# Patient Record
Sex: Female | Born: 1980 | Race: White | Hispanic: No | Marital: Married | Smoking: Never smoker
Health system: Southern US, Community
[De-identification: ages and names within clinical notes are randomized; demographics above are authoritative.]

## PROBLEM LIST (undated history)

## (undated) DIAGNOSIS — G4730 Sleep apnea, unspecified: Secondary | ICD-10-CM

## (undated) DIAGNOSIS — Z9889 Other specified postprocedural states: Secondary | ICD-10-CM

## (undated) DIAGNOSIS — C50919 Malignant neoplasm of unspecified site of unspecified female breast: Secondary | ICD-10-CM

## (undated) DIAGNOSIS — M549 Dorsalgia, unspecified: Secondary | ICD-10-CM

## (undated) DIAGNOSIS — I499 Cardiac arrhythmia, unspecified: Secondary | ICD-10-CM

## (undated) DIAGNOSIS — I341 Nonrheumatic mitral (valve) prolapse: Secondary | ICD-10-CM

## (undated) DIAGNOSIS — R112 Nausea with vomiting, unspecified: Secondary | ICD-10-CM

## (undated) DIAGNOSIS — F32A Depression, unspecified: Secondary | ICD-10-CM

## (undated) HISTORY — DX: Malignant neoplasm of unspecified site of unspecified female breast: C50.919

## (undated) HISTORY — PX: ABDOMINAL HYSTERECTOMY: SHX81

---

## 2011-08-10 ENCOUNTER — Encounter: Payer: Self-pay | Admitting: Interventional Cardiology

## 2011-08-19 ENCOUNTER — Encounter: Payer: Self-pay | Admitting: Interventional Cardiology

## 2015-03-28 ENCOUNTER — Encounter: Payer: Self-pay | Admitting: Interventional Cardiology

## 2015-07-05 ENCOUNTER — Encounter: Payer: Self-pay | Admitting: Interventional Cardiology

## 2015-10-05 HISTORY — PX: TUBAL LIGATION: SHX77

## 2016-11-25 ENCOUNTER — Emergency Department (HOSPITAL_COMMUNITY)
Admission: EM | Admit: 2016-11-25 | Discharge: 2016-11-25 | Disposition: A | Discharge status: Home / Self Care | Payer: BLUE CROSS/BLUE SHIELD | Attending: Emergency Medicine | Admitting: Emergency Medicine

## 2016-11-25 ENCOUNTER — Encounter (HOSPITAL_COMMUNITY): Payer: Self-pay | Admitting: Emergency Medicine

## 2016-11-25 ENCOUNTER — Emergency Department (HOSPITAL_COMMUNITY): Payer: BLUE CROSS/BLUE SHIELD

## 2016-11-25 DIAGNOSIS — R079 Chest pain, unspecified: Secondary | ICD-10-CM | POA: Diagnosis present

## 2016-11-25 HISTORY — DX: Nonrheumatic mitral (valve) prolapse: I34.1

## 2016-11-25 LAB — BASIC METABOLIC PANEL
Anion gap: 6 (ref 5–15)
BUN: 15 mg/dL (ref 6–20)
CHLORIDE: 108 mmol/L (ref 101–111)
CO2: 24 mmol/L (ref 22–32)
Calcium: 9 mg/dL (ref 8.9–10.3)
Creatinine, Ser: 0.64 mg/dL (ref 0.44–1.00)
Glucose, Bld: 88 mg/dL (ref 65–99)
POTASSIUM: 3.6 mmol/L (ref 3.5–5.1)
SODIUM: 138 mmol/L (ref 135–145)

## 2016-11-25 LAB — CBC
HEMATOCRIT: 38.4 % (ref 36.0–46.0)
Hemoglobin: 13.1 g/dL (ref 12.0–15.0)
MCH: 29.2 pg (ref 26.0–34.0)
MCHC: 34.1 g/dL (ref 30.0–36.0)
MCV: 85.7 fL (ref 78.0–100.0)
PLATELETS: 247 10*3/uL (ref 150–400)
RBC: 4.48 MIL/uL (ref 3.87–5.11)
RDW: 13.3 % (ref 11.5–15.5)
WBC: 6.3 10*3/uL (ref 4.0–10.5)

## 2016-11-25 LAB — I-STAT TROPONIN, ED: Troponin i, poc: 0 ng/mL (ref 0.00–0.08)

## 2016-11-25 NOTE — ED Triage Notes (Signed)
Pt from home with hx of mitral valve prolapse. Pt states she just moved here from [place] and has not established a cardiologist. Pt states she has had left chest and clavicle pain that radiates to her left arm. Pt states that the pain has gotten worse tonight. Pt states she also had 1 palpitation. Pt states she has had palpitations before. Pt stated she just wanted to come be evaluated to ensure there is nothing wrong. Pt has clear lung sounds, strong bilateral radial pulses, and normal S1 and S2

## 2016-11-25 NOTE — ED Provider Notes (Signed)
[facility]-EMERGENCY DEPT Provider Note   CSN: [id number] Arrival date & time: 11/25/16  0030   History   Chief Complaint Chief Complaint  Patient presents with  . Chest Pain    HPI Suzanne Tucker is a 36 y.o. female.  36 year old female with a history of mitral valve prolapse without regurgitation presents to the emergency department for evaluation of chest pain and palpitations. Patient states that she has a history of intermittent chest pain and palpitations. She noticed increased frequency in her palpitations over the course of 4 days last week. She was fighting a cold and thought that her palpitations may be due to this. She noticed some palpitations today as well at approximately 2000. She subsequently developed a sharp pain in her left clavicle at 2300. She states that she has felt this pain before, as well, but it seemed stronger today than normal. This lasted a few minutes before spontaneously resolving. Patient took 4 baby aspirin tablets prior to arrival. No other medications taken for pain. Pain is not worse with exertion. She had no associated shortness of breath, diaphoresis, syncope, near syncope, nausea, or vomiting. She is not currently followed by a cardiologist in the area. She has no complaints at the present time.      Past Medical History:  Diagnosis Date  . Mitral prolapse     There are no active problems to display for this patient.   History reviewed. No pertinent surgical history.  OB History    No data available       Home Medications    Prior to Admission medications   Not on File    Family History No family history on file.  Social History Social History  Substance Use Topics  . Smoking status: Never Smoker  . Smokeless tobacco: Never Used  . Alcohol use Yes     Comment: socially      Allergies   Morphine and related and Hydrocodone   Review of Systems Review of Systems Ten systems reviewed and are negative for acute change,  except as noted in the HPI.    Physical Exam Updated Vital Signs BP 125/84 (BP Location: Right Arm)   Pulse 90   Temp 98.6 F (37 C) (Oral)   Resp 18   Ht 5\' 1"  (1.549 m)   Wt 65.3 kg   SpO2 100%   BMI 27.21 kg/m   Physical Exam  Constitutional: She is oriented to person, place, and time. She appears well-developed and well-nourished. No distress.  Nontoxic and in NAD  HENT:  Head: Normocephalic and atraumatic.  Eyes: Conjunctivae and EOM are normal. No scleral icterus.  Neck: Normal range of motion.  No JVD  Cardiovascular: Normal rate, regular rhythm and intact distal pulses.   Pulmonary/Chest: Effort normal. No respiratory distress. She has no wheezes.  Respirations even and unlabored  Musculoskeletal: Normal range of motion.  No BLE edema  Neurological: She is alert and oriented to person, place, and time. She exhibits normal muscle tone. Coordination normal.  GCS 15. Patient moving all extremities.  Skin: Skin is warm and dry. No rash noted. She is not diaphoretic. No erythema. No pallor.  Psychiatric: She has a normal mood and affect. Her behavior is normal.  Nursing note and vitals reviewed.    ED Treatments / Results  Labs (all labs ordered are listed, but only abnormal results are displayed) Labs Reviewed  BASIC METABOLIC PANEL  CBC  I-STAT TROPOININ, ED    EKG  EKG Interpretation None  Radiology Dg Chest 2 View  Result Date: 11/25/2016 CLINICAL DATA:  36 year old female with chest pain. History of mitral valve prolapse. EXAM: CHEST  2 VIEW COMPARISON:  None. FINDINGS: The heart size and mediastinal contours are within normal limits. Both lungs are clear. The visualized skeletal structures are unremarkable. IMPRESSION: No active cardiopulmonary disease. Electronically Signed   By: Elgie Collard M.D.   On: 11/25/2016 01:57    Procedures Procedures (including critical care time)  Medications Ordered in ED Medications - No data to  display   Initial Impression / Assessment and Plan / ED Course  I have reviewed the triage vital signs and the nursing notes.  Pertinent labs & imaging results that were available during my care of the patient were reviewed by me and considered in my medical decision making (see chart for details).     Patient presents to the emergency department for evaluation of chest pain. Patient reports experiencing similar symptoms in the past. Onset of pain was at 2300. She has not had recurrence of symptoms since her pain spontaneously resolved. She had no associated diaphoresis, shortness of breath, nausea, vomiting, or syncope. Cardiac workup today is reassuring. Low suspicion for emergent process. Given history of mitral valve prolapse, I have encouraged the patient to follow-up with a cardiologist for recheck. She has been given a referral to a local cardiologist as she recently relocated to [place] [employer] [employer]. Return precautions discussed and provided. Patient discharged in stable condition with no unaddressed concerns.   Final Clinical Impressions(s) / ED Diagnoses   Final diagnoses:  Nonspecific chest pain    New Prescriptions New Prescriptions   No medications on file     Antony Madura, PA-C 11/25/16 [id number]    April Palumbo, MD 11/25/16 [phone]

## 2016-11-25 NOTE — ED Notes (Signed)
Bed: WTR7 Expected date:  Expected time:  Means of arrival:  Comments: 

## 2017-10-28 ENCOUNTER — Encounter: Payer: Self-pay | Admitting: Emergency Medicine

## 2017-10-28 ENCOUNTER — Ambulatory Visit: Payer: BC Managed Care – PPO | Admitting: Emergency Medicine

## 2017-10-28 ENCOUNTER — Other Ambulatory Visit: Payer: Self-pay

## 2017-10-28 VITALS — BP 123/74 | HR 94 | Temp 98.5°F | Resp 16 | Ht 60.75 in | Wt 145.2 lb

## 2017-10-28 DIAGNOSIS — R202 Paresthesia of skin: Secondary | ICD-10-CM | POA: Insufficient documentation

## 2017-10-28 DIAGNOSIS — I341 Nonrheumatic mitral (valve) prolapse: Secondary | ICD-10-CM

## 2017-10-28 DIAGNOSIS — R209 Unspecified disturbances of skin sensation: Secondary | ICD-10-CM | POA: Diagnosis not present

## 2017-10-28 NOTE — Progress Notes (Signed)
Suzanne Tucker 37 y.o.   Chief Complaint  Patient presents with  . Establish Care    discuss NUMBNESS of forehead x 4 days  . Referral    CARDIOLOGY - per patient she has mitral valve prolaspe    HISTORY OF PRESENT ILLNESS: This is a 37 y.o. female complaining of numbness to forehead x 4 days; no other symptoms; has strong FHx of MS and is concerned; also has h/o MVP and needs cardiology referral. Has occasional palpitations.  HPI   Prior to Admission medications   Not on File    Allergies  Allergen Reactions  . Morphine And Related Anaphylaxis  . Hydrocodone Hives    There are no active problems to display for this patient.   Past Medical History:  Diagnosis Date  . Mitral prolapse     Past Surgical History:  Procedure Laterality Date  . CESAREAN SECTION  03/30/2008  . TUBAL LIGATION  2017    Social History   Socioeconomic History  . Marital status: Single    Spouse name: Not on file  . Number of children: Not on file  . Years of education: Not on file  . Highest education level: Not on file  Social Needs  . Financial resource strain: Not on file  . Food insecurity - worry: Not on file  . Food insecurity - inability: Not on file  . Transportation needs - medical: Not on file  . Transportation needs - non-medical: Not on file  Occupational History  . Not on file  Tobacco Use  . Smoking status: Never Smoker  . Smokeless tobacco: Never Used  Substance and Sexual Activity  . Alcohol use: Yes    Comment: socially   . Drug use: Not on file  . Sexual activity: Not on file  Other Topics Concern  . Not on file  Social History Narrative  . Not on file    Family History  Problem Relation Age of Onset  . Diabetes Maternal Grandmother   . Heart disease Maternal Grandmother   . Heart attack Maternal Grandmother   . Diabetes Maternal Grandfather   . Heart disease Maternal Grandfather   . Alzheimer's disease Paternal Grandmother   . Dementia Paternal  Grandmother   . Alzheimer's disease Paternal Grandfather   . Dementia Paternal Grandfather   . Alcohol abuse Paternal Grandfather      Review of Systems  Constitutional: Negative.  Negative for chills, fever, malaise/fatigue and weight loss.  HENT: Negative.  Negative for congestion, ear pain, hearing loss, nosebleeds, sinus pain and sore throat.   Eyes: Negative.  Negative for blurred vision, double vision, photophobia, pain, discharge and redness.  Respiratory: Negative.  Negative for cough, shortness of breath and wheezing.   Cardiovascular: Positive for palpitations. Negative for chest pain.  Gastrointestinal: Negative.  Negative for abdominal pain, blood in stool, diarrhea, nausea and vomiting.  Genitourinary: Negative.  Negative for dysuria and hematuria.  Musculoskeletal: Negative for back pain, myalgias and neck pain.  Skin: Negative.  Negative for rash.  Neurological: Negative for dizziness, tremors, sensory change, speech change, focal weakness, seizures, loss of consciousness and headaches.  Endo/Heme/Allergies: Negative.    Vitals:   10/28/17 1637  BP: 123/74  Pulse: 94  Resp: 16  Temp: 98.5 F (36.9 C)  SpO2: 100%     Physical Exam  Constitutional: She is oriented to person, place, and time. She appears well-developed and well-nourished.  HENT:  Head: Normocephalic and atraumatic.  Right Ear: Hearing, tympanic membrane,  external ear and ear canal normal.  Left Ear: Hearing, tympanic membrane, external ear and ear canal normal.  Nose: Nose normal.  Mouth/Throat: Oropharynx is clear and moist.  Eyes: Conjunctivae and EOM are normal. Pupils are equal, round, and reactive to light.  Fundoscopic exam:      The right eye shows no AV nicking, no exudate, no hemorrhage and no papilledema.       The left eye shows no AV nicking, no exudate, no hemorrhage and no papilledema.  Neck: Normal range of motion. Neck supple. No JVD present. No thyromegaly present.   Cardiovascular: Normal rate, regular rhythm and normal heart sounds.  No murmur (no MR murmur or clicks) heard. Pulmonary/Chest: Effort normal and breath sounds normal.  Musculoskeletal: Normal range of motion.  Lymphadenopathy:    She has no cervical adenopathy.  Neurological: She is alert and oriented to person, place, and time. No sensory deficit. She exhibits normal muscle tone.  Skin: Skin is warm and dry. Capillary refill takes less than 2 seconds. No rash noted.  Psychiatric: She has a normal mood and affect. Her behavior is normal.  Vitals reviewed.    ASSESSMENT & PLAN: Suzanne Tucker was seen today for establish care and referral.  Diagnoses and all orders for this visit:  Facial paresthesia -     Ambulatory referral to Neurology -     CBC with Differential/Platelet -     Comprehensive metabolic panel -     Thyroid Profile  MVP (mitral valve prolapse) -     Ambulatory referral to Cardiology      Suzanne Barth, MD [facility]

## 2017-10-28 NOTE — Patient Instructions (Signed)
     IF you received an x-ray today, you will receive an invoice from [facility]. Please contact [facility] at [phone] with questions or concerns regarding your invoice.   IF you received labwork today, you will receive an invoice from [facility]. Please contact [facility] at [phone] with questions or concerns regarding your invoice.   Our billing staff will not be able to assist you with questions regarding bills from these companies.  You will be contacted with the lab results as soon as they are available. The fastest way to get your results is to activate your My Chart account. Instructions are located on the last page of this paperwork. If you have not heard from us regarding the results in 2 weeks, please contact this office.     

## 2017-10-29 LAB — COMPREHENSIVE METABOLIC PANEL
ALK PHOS: 61 IU/L (ref 39–117)
ALT: 10 IU/L (ref 0–32)
AST: 18 IU/L (ref 0–40)
Albumin/Globulin Ratio: 1.7 (ref 1.2–2.2)
Albumin: 4.5 g/dL (ref 3.5–5.5)
BUN/Creatinine Ratio: 11 (ref 9–23)
BUN: 8 mg/dL (ref 6–20)
Bilirubin Total: 0.4 mg/dL (ref 0.0–1.2)
CO2: 26 mmol/L (ref 20–29)
CREATININE: 0.74 mg/dL (ref 0.57–1.00)
Calcium: 9.4 mg/dL (ref 8.7–10.2)
Chloride: 101 mmol/L (ref 96–106)
GFR calc Af Amer: 122 mL/min/{1.73_m2} (ref >59)
GFR calc non Af Amer: 106 mL/min/{1.73_m2} (ref >59)
GLOBULIN, TOTAL: 2.6 g/dL (ref 1.5–4.5)
Glucose: 119 mg/dL — ABNORMAL HIGH (ref 65–99)
POTASSIUM: 3.9 mmol/L (ref 3.5–5.2)
SODIUM: 142 mmol/L (ref 134–144)
Total Protein: 7.1 g/dL (ref 6.0–8.5)

## 2017-10-29 LAB — CBC WITH DIFFERENTIAL/PLATELET
BASOS ABS: 0 10*3/uL (ref 0.0–0.2)
Basos: 0 %
EOS (ABSOLUTE): 0.1 10*3/uL (ref 0.0–0.4)
EOS: 2 %
HEMATOCRIT: 37.7 % (ref 34.0–46.6)
Hemoglobin: 12.4 g/dL (ref 11.1–15.9)
Immature Grans (Abs): 0 10*3/uL (ref 0.0–0.1)
Immature Granulocytes: 0 %
LYMPHS ABS: 1.5 10*3/uL (ref 0.7–3.1)
Lymphs: 21 %
MCH: 29.4 pg (ref 26.6–33.0)
MCHC: 32.9 g/dL (ref 31.5–35.7)
MCV: 89 fL (ref 79–97)
MONOS ABS: 0.4 10*3/uL (ref 0.1–0.9)
Monocytes: 6 %
Neutrophils Absolute: 5.1 10*3/uL (ref 1.4–7.0)
Neutrophils: 71 %
Platelets: 284 10*3/uL (ref 150–379)
RBC: 4.22 x10E6/uL (ref 3.77–5.28)
RDW: 13.1 % (ref 12.3–15.4)
WBC: 7.2 10*3/uL (ref 3.4–10.8)

## 2017-10-29 LAB — THYROID PANEL
Free Thyroxine Index: 2 (ref 1.2–4.9)
T3 UPTAKE RATIO: 23 % — AB (ref 24–39)
T4 TOTAL: 8.5 ug/dL (ref 4.5–12.0)

## 2017-11-01 ENCOUNTER — Telehealth: Payer: Self-pay | Admitting: Emergency Medicine

## 2017-11-01 NOTE — Telephone Encounter (Signed)
Returned pt call and left message for pt to call back.

## 2017-11-02 ENCOUNTER — Ambulatory Visit: Payer: BC Managed Care – PPO | Admitting: Neurology

## 2017-11-02 ENCOUNTER — Encounter: Payer: Self-pay | Admitting: Neurology

## 2017-11-02 VITALS — BP 129/82 | HR 77 | Ht 60.75 in | Wt 147.0 lb

## 2017-11-02 DIAGNOSIS — Z820 Family history of epilepsy and other diseases of the nervous system: Secondary | ICD-10-CM

## 2017-11-02 DIAGNOSIS — G5601 Carpal tunnel syndrome, right upper limb: Secondary | ICD-10-CM | POA: Diagnosis not present

## 2017-11-02 DIAGNOSIS — R209 Unspecified disturbances of skin sensation: Secondary | ICD-10-CM

## 2017-11-02 DIAGNOSIS — I341 Nonrheumatic mitral (valve) prolapse: Secondary | ICD-10-CM

## 2017-11-02 DIAGNOSIS — R202 Paresthesia of skin: Secondary | ICD-10-CM

## 2017-11-02 NOTE — Progress Notes (Addendum)
[facility]  PATIENT: Suzanne Tucker DOB: Sep 07, 1981  REFERRING DOCTOR OR PCP:  Edwina Barth SOURCE: patient, notes from Drs. Sagardia  _________________________________   HISTORICAL  CHIEF COMPLAINT:  Chief Complaint  Patient presents with  . New Patient (Initial Visit)    facial numbness    HISTORY OF PRESENT ILLNESS:  I had the pleasure of seeing your patient, Suzanne Tucker, at [facility] for neurologic consultation regarding her facial numbness.  On 10/24/2017, she had the onset of left facial numbness (in the V1 distributin of the left face.    Numbness only involves the eye and thte forehead.     She had a few minutes of numbness in the right neck/occiput but that lasted only a few minutes.    She denies change in visoin or facial weakness.     She has had some left leg numbness and in both hands since 2017 but these symptoms typically last only a few minutes and hand numbness improves with shaking.   . The numbness in the left foot started after she fell 5-6 weeks ago. The numbness in the hands is worse on the right and will sometimes wake her up from sleep.  She deneis problems with bladder function.   Vision is fine.   Gait,strength and snesation are norml in her limbs.    She gets vertigo with climbing a bladder but has done so x years.     She has noted some reduced memory and there are conversations that occur that she does not remember.   Her family have also noted this occurring.   She teaches fourth grade.  She has mitral valve prolapse and has had some spells of dizziness which are felt to be related.   She does not smoke.   She is a vegetarian but eats fish and eggs.     She does not take any supplements.     Two maternal aunts have multiple sclerosis.      REVIEW OF SYSTEMS: Constitutional: No fevers, chills, sweats, or change in appetite Eyes: No visual changes, double vision, eye pain Ear, nose and throat: No  hearing loss, ear pain, nasal congestion, sore throat Cardiovascular: No chest pain, palpitations Respiratory: No shortness of breath at rest or with exertion.   No wheezes GastrointestinaI: No nausea, vomiting, diarrhea, abdominal pain, fecal incontinence Genitourinary: No dysuria, urinary retention or frequency.  No nocturia. Musculoskeletal: No neck pain, back pain Integumentary: No rash, pruritus, skin lesions Neurological: as above Psychiatric: No depression at this time.  No anxiety Endocrine: No palpitations, diaphoresis, change in appetite, change in weigh or increased thirst Hematologic/Lymphatic: No anemia, purpura, petechiae. Allergic/Immunologic: No itchy/runny eyes, nasal congestion, recent allergic reactions, rashes  ALLERGIES: Allergies  Allergen Reactions  . Morphine Shortness Of Breath  . Morphine And Related Anaphylaxis  . Latex Rash  . Hydrocodone Hives    HOME MEDICATIONS: No current outpatient medications on file.  PAST MEDICAL HISTORY: Past Medical History:  Diagnosis Date  . Mitral prolapse     PAST SURGICAL HISTORY: Past Surgical History:  Procedure Laterality Date  . CESAREAN SECTION  03/30/2008  . TUBAL LIGATION  2017    FAMILY HISTORY: Family History  Problem Relation Age of Onset  . Diabetes Maternal Grandmother   . Heart disease Maternal Grandmother   . Heart attack Maternal Grandmother   . Diabetes Maternal Grandfather   . Heart disease Maternal Grandfather   . Alzheimer's disease Paternal Grandmother   . Dementia  Paternal Grandmother   . Alzheimer's disease Paternal Grandfather   . Dementia Paternal Grandfather   . Alcohol abuse Paternal Grandfather     SOCIAL HISTORY:  Social History   Socioeconomic History  . Marital status: Single    Spouse name: Not on file  . Number of children: Not on file  . Years of education: Not on file  . Highest education level: Not on file  Social Needs  . Financial resource strain: Not on  file  . Food insecurity - worry: Not on file  . Food insecurity - inability: Not on file  . Transportation needs - medical: Not on file  . Transportation needs - non-medical: Not on file  Occupational History  . Not on file  Tobacco Use  . Smoking status: Never Smoker  . Smokeless tobacco: Never Used  Substance and Sexual Activity  . Alcohol use: Yes    Comment: socially   . Drug use: Not on file  . Sexual activity: Not on file  Other Topics Concern  . Not on file  Social History Narrative  . Not on file     PHYSICAL EXAM  Vitals:   11/02/17 1514  BP: 129/82  Pulse: 77  Weight: 147 lb (66.7 kg)  Height: 5' 0.75" (1.543 m)    Body mass index is 28 kg/m.   General: The patient is well-developed and well-nourished and in no acute distress  Eyes:  Funduscopic exam shows normal optic discs and retinal vessels.  Neck: The neck is supple, no carotid bruits are noted.  The neck is nontender.  Cardiovascular: The heart has a regular rate and rhythm with a normal S1 and S2. There were no murmurs, gallops or rubs.   Skin: Extremities are without rash or edema  Musculoskeletal:  Back is nontender  Neurologic Exam  Mental status: The patient is alert and oriented x 3 at the time of the examination. The patient has apparent normal recent and remote memory, with an apparently normal attention span and concentration ability.   Speech is normal.  Cranial nerves: Extraocular movements are full. Pupils are equal, round, and reactive to light and accomodation.  Visual fields are full.  Facial symmetry is present.she reports mildly reduced sensation to touch and temperature in the upper face on the left. sensation around the vertex is back to normal peer.   Facial strength is normal.  Trapezius and sternocleidomastoid strength is normal. No dysarthria is noted.  The tongue is midline, and the patient has symmetric elevation of the soft palate. No obvious hearing deficits are  noted.  Motor:  Muscle bulk is normal.   Tone is normal. Strength is  5 / 5 in all 4 extremities.   Sensory: Sensory testing is intact to pinprick, soft touch and vibration sensation in all 4 extremities except for mild reduced touch but normal vibration in the left foot.     Coordination: Cerebellar testing reveals good finger-nose-finger and heel-to-shin bilaterally.  Gait and station: Station is normal.   Gait is normal. Tandem gait is normal. Romberg is negative.   Reflexes: Deep tendon reflexes are symmetric and normal bilaterally.   Plantar responses are flexor.    DIAGNOSTIC DATA (LABS, IMAGING, TESTING) - I reviewed patient records, labs, notes, testing and imaging myself where available.  Lab Results  Component Value Date   WBC 7.2 10/28/2017   HGB 12.4 10/28/2017   HCT 37.7 10/28/2017   MCV 89 10/28/2017   PLT 284 10/28/2017  Component Value Date/Time   NA 142 10/28/2017 1747   K 3.9 10/28/2017 1747   CL 101 10/28/2017 1747   CO2 26 10/28/2017 1747   GLUCOSE 119 (H) 10/28/2017 1747   GLUCOSE 88 11/25/2016 0108   BUN 8 10/28/2017 1747   CREATININE 0.74 10/28/2017 1747   CALCIUM 9.4 10/28/2017 1747   PROT 7.1 10/28/2017 1747   ALBUMIN 4.5 10/28/2017 1747   AST 18 10/28/2017 1747   ALT 10 10/28/2017 1747   ALKPHOS 61 10/28/2017 1747   BILITOT 0.4 10/28/2017 1747   GFRNONAA 106 10/28/2017 1747   GFRAA 122 10/28/2017 1747       ASSESSMENT AND PLAN  Facial paresthesia - Plan: MR BRAIN W WO CONTRAST  Family history of MS (multiple sclerosis) - Plan: MR BRAIN W WO CONTRAST  MVP (mitral valve prolapse)  Carpal tunnel syndrome on right   In summary, Suzanne Tucker is a 37 year old woman with a 9 day history of left upper face numbness that has just started to improve over the last day.   Although there is some tingling, there is no pain. The etiology is uncertain. Given her age and family history, multiple sclerosis certainly has to be considered.  I  will check an MRI of the brain to determine if there are any demyelinating plaques or other abnormality that could be causing her symptoms.    If the MRI is consistent with MS, I will have her come back so we can discuss disease modifying therapies. Depending on results, additional testing such as a lumbar puncture and blood work may also be necessary.  A second problem is intermittent numbness in the hands, right greater than left. She does appear to have carpal tunnel syndrome on examination.    She is advised to wear a wrist splint on the right when she is at rest or in bed.  She will return to see me as needed based on the MRI findings. I advised her to call us back if she does note any new or worsening neurologic symptoms.  Thank you for asking me to see Mrs. Sakuma for a neurologic consultation. Please let me know if I can be of further assistance with her or other patients in the future.  Richard A. Epimenio Foot, MD, PhD, FAAN Certified in Neurology, Clinical Neurophysiology, Sleep Medicine, Pain Medicine and Neuroimaging Director, [facility]  [facility] [address], [address] [place], [place] [address] [phone]

## 2017-11-08 ENCOUNTER — Ambulatory Visit: Payer: BC Managed Care – PPO

## 2017-11-08 DIAGNOSIS — R202 Paresthesia of skin: Secondary | ICD-10-CM

## 2017-11-08 DIAGNOSIS — R209 Unspecified disturbances of skin sensation: Secondary | ICD-10-CM | POA: Diagnosis not present

## 2017-11-08 DIAGNOSIS — Z820 Family history of epilepsy and other diseases of the nervous system: Secondary | ICD-10-CM | POA: Diagnosis not present

## 2017-11-09 ENCOUNTER — Telehealth: Payer: Self-pay | Admitting: *Deleted

## 2017-11-09 MED ORDER — GADOPENTETATE DIMEGLUMINE 469.01 MG/ML IV SOLN: 13.0000 mL | Freq: Once | INTRAVENOUS | Status: DC | PRN

## 2017-11-09 NOTE — Telephone Encounter (Signed)
Spoke with pt. and per RAS, advised MRI brain is normal, with exception of sinusitis.  Per RAS, ok for Z-pack.  Pt. verbalized understanding of same, declined antibiotic at this time/fim

## 2017-12-13 ENCOUNTER — Encounter: Payer: Self-pay | Admitting: Emergency Medicine

## 2017-12-13 ENCOUNTER — Ambulatory Visit: Payer: BC Managed Care – PPO | Admitting: Emergency Medicine

## 2017-12-13 VITALS — BP 113/75 | HR 93 | Temp 98.4°F | Resp 18 | Ht 61.5 in | Wt 148.0 lb

## 2017-12-13 DIAGNOSIS — J324 Chronic pansinusitis: Secondary | ICD-10-CM

## 2017-12-13 DIAGNOSIS — J069 Acute upper respiratory infection, unspecified: Secondary | ICD-10-CM

## 2017-12-13 DIAGNOSIS — R05 Cough: Secondary | ICD-10-CM

## 2017-12-13 DIAGNOSIS — I341 Nonrheumatic mitral (valve) prolapse: Secondary | ICD-10-CM | POA: Diagnosis not present

## 2017-12-13 DIAGNOSIS — R059 Cough, unspecified: Secondary | ICD-10-CM

## 2017-12-13 MED ORDER — AMOXICILLIN-POT CLAVULANATE 875-125 MG PO TABS: 1.0000 | ORAL_TABLET | Freq: Two times a day (BID) | ORAL | 0 refills | Status: AC

## 2017-12-13 NOTE — Assessment & Plan Note (Signed)
Incidental finding on recent MRI.  Given the additional symptoms of an upper respiratory infection will start treatment with Augmentin twice a day for 7 days.  Will monitor symptoms.  Advised to return next week if no better or worse.

## 2017-12-13 NOTE — Progress Notes (Signed)
Suzanne Tucker 37 y.o.   Chief Complaint  Patient presents with  . Nasal Congestion  . Cough  . Emesis    HISTORY OF PRESENT ILLNESS: This is a 37 y.o. female complaining of 2-day history of nasal congestion with productive cough.  Vomited twice yesterday.  No longer vomiting.  Denies headache.  Some green nasal discharge.  Denies fever or chills.  Seen by me 1/25 2019 with facial paresthesia.  Seen by neurologist.  There was a concern about MS.  Had normal MRI with and without contrast.  Incidental finding of chronic pansinusitis.  Facial paresthesia gone.  Denies any other significant symptoms.  HPI   Prior to Admission medications   Not on File    Allergies  Allergen Reactions  . Morphine Shortness Of Breath  . Morphine And Related Anaphylaxis  . Latex Rash  . Hydrocodone Hives    Patient Active Problem List   Diagnosis Date Noted  . Family history of MS (multiple sclerosis) 11/02/2017  . Carpal tunnel syndrome on right 11/02/2017  . MVP (mitral valve prolapse) 10/28/2017  . Facial paresthesia 10/28/2017    Past Medical History:  Diagnosis Date  . Mitral prolapse     Past Surgical History:  Procedure Laterality Date  . CESAREAN SECTION  03/30/2008  . TUBAL LIGATION  2017    Social History   Socioeconomic History  . Marital status: Single    Spouse name: Not on file  . Number of children: Not on file  . Years of education: Not on file  . Highest education level: Not on file  Social Needs  . Financial resource strain: Not on file  . Food insecurity - worry: Not on file  . Food insecurity - inability: Not on file  . Transportation needs - medical: Not on file  . Transportation needs - non-medical: Not on file  Occupational History  . Not on file  Tobacco Use  . Smoking status: Never Smoker  . Smokeless tobacco: Never Used  Substance and Sexual Activity  . Alcohol use: Yes    Comment: socially   . Drug use: No  . Sexual activity: No  Other  Topics Concern  . Not on file  Social History Narrative  . Not on file    Family History  Problem Relation Age of Onset  . Diabetes Maternal Grandmother   . Heart disease Maternal Grandmother   . Heart attack Maternal Grandmother   . Diabetes Maternal Grandfather   . Heart disease Maternal Grandfather   . Alzheimer's disease Paternal Grandmother   . Dementia Paternal Grandmother   . Alzheimer's disease Paternal Grandfather   . Dementia Paternal Grandfather   . Alcohol abuse Paternal Grandfather      Review of Systems  Constitutional: Negative.  Negative for chills, fever, malaise/fatigue and weight loss.  HENT: Positive for congestion. Negative for ear pain, hearing loss, nosebleeds, sinus pain and sore throat.   Eyes: Negative for blurred vision, double vision, pain, discharge and redness.  Respiratory: Positive for cough.   Cardiovascular: Negative for chest pain, palpitations, claudication and leg swelling.  Gastrointestinal: Positive for nausea and vomiting. Negative for abdominal pain, blood in stool, diarrhea and melena.  Genitourinary: Negative.  Negative for dysuria, flank pain and hematuria.  Musculoskeletal: Negative for back pain, myalgias and neck pain.  Skin: Negative.  Negative for rash.  Neurological: Positive for headaches. Negative for dizziness, sensory change, speech change and focal weakness.  Endo/Heme/Allergies: Negative.   All other systems reviewed  and are negative.   Vitals:   12/13/17 0919  BP: 113/75  Pulse: 93  Resp: 18  Temp: 98.4 F (36.9 C)  SpO2: 98%    Physical Exam  Constitutional: She is oriented to person, place, and time. She appears well-developed and well-nourished.  HENT:  Head: Normocephalic and atraumatic.  Right Ear: External ear normal.  Left Ear: External ear normal.  Nose: Nose normal.  Mouth/Throat: Oropharynx is clear and moist. No oropharyngeal exudate.  Eyes: Conjunctivae and EOM are normal. Pupils are equal,  round, and reactive to light.  Neck: Normal range of motion. Neck supple. No JVD present. No thyromegaly present.  Cardiovascular: Normal rate, regular rhythm and normal heart sounds.  No murmur heard. No MVP click.  Pulmonary/Chest: Effort normal and breath sounds normal. No respiratory distress.  Abdominal: Soft. There is no tenderness.  Musculoskeletal: Normal range of motion.  Lymphadenopathy:    She has no cervical adenopathy.  Neurological: She is alert and oriented to person, place, and time. No sensory deficit. She exhibits normal muscle tone.  Skin: Skin is warm and dry. Capillary refill takes less than 2 seconds.  Psychiatric: She has a normal mood and affect. Her behavior is normal.  Vitals reviewed.  Chronic pansinusitis Incidental finding on recent MRI.  Given the additional symptoms of an upper respiratory infection will start treatment with Augmentin twice a day for 7 days.  Will monitor symptoms.  Advised to return next week if no better or worse.    ASSESSMENT & PLAN: Melony was seen today for nasal congestion, cough and emesis.  Diagnoses and all orders for this visit:  Cough  Acute upper respiratory infection  Chronic pansinusitis -     amoxicillin-clavulanate (AUGMENTIN) 875-125 MG tablet; Take 1 tablet by mouth 2 (two) times daily for 7 days.  MVP (mitral valve prolapse)    Patient Instructions       IF you received an x-ray today, you will receive an invoice from [facility]. Please contact [facility] at [phone] with questions or concerns regarding your invoice.   IF you received labwork today, you will receive an invoice from [place]. Please contact [facility] at [phone] with questions or concerns regarding your invoice.   Our billing staff will not be able to assist you with questions regarding bills from these companies.  You will be contacted with the lab results as soon as they are available. The fastest way to  get your results is to activate your My Chart account. Instructions are located on the last page of this paperwork. If you have not heard from [place] regarding the results in 2 weeks, please contact this office.      Upper Respiratory Infection, Adult Most upper respiratory infections (URIs) are caused by a virus. A URI affects the nose, throat, and upper air passages. The most common type of URI is often called "the common cold." Follow these instructions at home:  Take medicines only as told by your doctor.  Gargle warm saltwater or take cough drops to comfort your throat as told by your doctor.  Use a warm mist humidifier or inhale steam from a shower to increase air moisture. This may make it easier to breathe.  Drink enough fluid to keep your pee (urine) clear or pale yellow.  Eat soups and other clear broths.  Have a healthy diet.  Rest as needed.  Go back to work when your fever is gone or your doctor says it is okay. ? You  may need to stay home longer to avoid giving your URI to others. ? You can also wear a face mask and wash your hands often to prevent spread of the virus.  Use your inhaler more if you have asthma.  Do not use any tobacco products, including cigarettes, chewing tobacco, or electronic cigarettes. If you need help quitting, ask your doctor. Contact a doctor if:  You are getting worse, not better.  Your symptoms are not helped by medicine.  You have chills.  You are getting more short of breath.  You have brown or red mucus.  You have yellow or brown discharge from your nose.  You have pain in your face, especially when you bend forward.  You have a fever.  You have puffy (swollen) neck glands.  You have pain while swallowing.  You have white areas in the back of your throat. Get help right away if:  You have very bad or constant: ? Headache. ? Ear pain. ? Pain in your forehead, behind your eyes, and over your cheekbones (sinus  pain). ? Chest pain.  You have long-lasting (chronic) lung disease and any of the following: ? Wheezing. ? Long-lasting cough. ? Coughing up blood. ? A change in your usual mucus.  You have a stiff neck.  You have changes in your: ? Vision. ? Hearing. ? Thinking. ? Mood. This information is not intended to replace advice given to you by your health care provider. Make sure you discuss any questions you have with your health care provider. Document Released: 03/08/2008 Document Revised: 05/23/2016 Document Reviewed: 12/26/2013 Elsevier Interactive Patient Education  2018 Elsevier Inc.  Cough, Adult A cough helps to clear your throat and lungs. A cough may last only 2-3 weeks (acute), or it may last longer than 8 weeks (chronic). Many different things can cause a cough. A cough may be a sign of an illness or another medical condition. Follow these instructions at home:  Pay attention to any changes in your cough.  Take medicines only as told by your doctor. ? If you were prescribed an antibiotic medicine, take it as told by your doctor. Do not stop taking it even if you start to feel better. ? Talk with your doctor before you try using a cough medicine.  Drink enough fluid to keep your pee (urine) clear or pale yellow.  If the air is dry, use a cold steam vaporizer or humidifier in your home.  Stay away from things that make you cough at work or at home.  If your cough is worse at night, try using extra pillows to raise your head up higher while you sleep.  Do not smoke, and try not to be around smoke. If you need help quitting, ask your doctor.  Do not have caffeine.  Do not drink alcohol.  Rest as needed. Contact a doctor if:  You have new problems (symptoms).  You cough up yellow fluid (pus).  Your cough does not get better after 2-3 weeks, or your cough gets worse.  Medicine does not help your cough and you are not sleeping well.  You have pain that gets  worse or pain that is not helped with medicine.  You have a fever.  You are losing weight and you do not know why.  You have night sweats. Get help right away if:  You cough up blood.  You have trouble breathing.  Your heartbeat is very fast. This information is not intended to replace advice  given to you by your health care provider. Make sure you discuss any questions you have with your health care provider. Document Released: 06/03/2011 Document Revised: 02/26/2016 Document Reviewed: 11/27/2014 Elsevier Interactive Patient Education  2018 Elsevier Inc.      Edwina Barth, MD [facility]

## 2017-12-13 NOTE — Patient Instructions (Addendum)
IF you received an x-ray today, you will receive an invoice from [facility]. Please contact [facility] at [phone] with questions or concerns regarding your invoice.   IF you received labwork today, you will receive an invoice from [place]. Please contact [facility] at [phone] with questions or concerns regarding your invoice.   Our billing staff will not be able to assist you with questions regarding bills from these companies.  You will be contacted with the lab results as soon as they are available. The fastest way to get your results is to activate your My Chart account. Instructions are located on the last page of this paperwork. If you have not heard from [place] regarding the results in 2 weeks, please contact this office.      Upper Respiratory Infection, Adult Most upper respiratory infections (URIs) are caused by a virus. A URI affects the nose, throat, and upper air passages. The most common type of URI is often called "the common cold." Follow these instructions at home:  Take medicines only as told by your doctor.  Gargle warm saltwater or take cough drops to comfort your throat as told by your doctor.  Use a warm mist humidifier or inhale steam from a shower to increase air moisture. This may make it easier to breathe.  Drink enough fluid to keep your pee (urine) clear or pale yellow.  Eat soups and other clear broths.  Have a healthy diet.  Rest as needed.  Go back to work when your fever is gone or your doctor says it is okay. ? You may need to stay home longer to avoid giving your URI to others. ? You can also wear a face mask and wash your hands often to prevent spread of the virus.  Use your inhaler more if you have asthma.  Do not use any tobacco products, including cigarettes, chewing tobacco, or electronic cigarettes. If you need help quitting, ask your doctor. Contact a doctor if:  You are getting worse, not better.  Your  symptoms are not helped by medicine.  You have chills.  You are getting more short of breath.  You have brown or red mucus.  You have yellow or brown discharge from your nose.  You have pain in your face, especially when you bend forward.  You have a fever.  You have puffy (swollen) neck glands.  You have pain while swallowing.  You have white areas in the back of your throat. Get help right away if:  You have very bad or constant: ? Headache. ? Ear pain. ? Pain in your forehead, behind your eyes, and over your cheekbones (sinus pain). ? Chest pain.  You have long-lasting (chronic) lung disease and any of the following: ? Wheezing. ? Long-lasting cough. ? Coughing up blood. ? A change in your usual mucus.  You have a stiff neck.  You have changes in your: ? Vision. ? Hearing. ? Thinking. ? Mood. This information is not intended to replace advice given to you by your health care provider. Make sure you discuss any questions you have with your health care provider. Document Released: 03/08/2008 Document Revised: 05/23/2016 Document Reviewed: 12/26/2013 Elsevier Interactive Patient Education  2018 Elsevier Inc.  Cough, Adult A cough helps to clear your throat and lungs. A cough may last only 2-3 weeks (acute), or it may last longer than 8 weeks (chronic). Many different things can cause a cough. A cough may be a sign of an illness  or another medical condition. Follow these instructions at home:  Pay attention to any changes in your cough.  Take medicines only as told by your doctor. ? If you were prescribed an antibiotic medicine, take it as told by your doctor. Do not stop taking it even if you start to feel better. ? Talk with your doctor before you try using a cough medicine.  Drink enough fluid to keep your pee (urine) clear or pale yellow.  If the air is dry, use a cold steam vaporizer or humidifier in your home.  Stay away from things that make you cough  at work or at home.  If your cough is worse at night, try using extra pillows to raise your head up higher while you sleep.  Do not smoke, and try not to be around smoke. If you need help quitting, ask your doctor.  Do not have caffeine.  Do not drink alcohol.  Rest as needed. Contact a doctor if:  You have new problems (symptoms).  You cough up yellow fluid (pus).  Your cough does not get better after 2-3 weeks, or your cough gets worse.  Medicine does not help your cough and you are not sleeping well.  You have pain that gets worse or pain that is not helped with medicine.  You have a fever.  You are losing weight and you do not know why.  You have night sweats. Get help right away if:  You cough up blood.  You have trouble breathing.  Your heartbeat is very fast. This information is not intended to replace advice given to you by your health care provider. Make sure you discuss any questions you have with your health care provider. Document Released: 06/03/2011 Document Revised: 02/26/2016 Document Reviewed: 11/27/2014 Elsevier Interactive Patient Education  [employer].

## 2017-12-15 ENCOUNTER — Ambulatory Visit: Payer: Self-pay | Admitting: Interventional Cardiology

## 2018-03-07 ENCOUNTER — Ambulatory Visit: Payer: BC Managed Care – PPO | Admitting: Interventional Cardiology

## 2018-03-07 ENCOUNTER — Encounter (INDEPENDENT_AMBULATORY_CARE_PROVIDER_SITE_OTHER): Payer: Self-pay

## 2018-03-07 ENCOUNTER — Encounter: Payer: Self-pay | Admitting: Interventional Cardiology

## 2018-03-07 VITALS — BP 110/82 | HR 67 | Ht 61.5 in | Wt 148.0 lb

## 2018-03-07 DIAGNOSIS — Z13220 Encounter for screening for lipoid disorders: Secondary | ICD-10-CM

## 2018-03-07 DIAGNOSIS — I341 Nonrheumatic mitral (valve) prolapse: Secondary | ICD-10-CM

## 2018-03-07 NOTE — Progress Notes (Signed)
Cardiology Office Note   Date:  03/07/2018   ID:  Suzanne, Tucker [birth year], MRN [id number]  PCP:  Georgina Quint, MD    No chief complaint on file.  MVP  Wt Readings from Last 3 Encounters:  03/07/18 148 lb (67.1 kg)  12/13/17 148 lb (67.1 kg)  11/02/17 147 lb (66.7 kg)       History of Present Illness: Suzanne Tucker is a 37 y.o. female who is being seen today for the evaluation of mitral valve prolapse at the request of Georgina Quint, *.  She carries this diagnosis for the past 5-6 years.  She was living in [place], [place]., Dr. Melanie Crazier.  Was not using antibiotics for dental appts.  She has had palpitations, but has not worn a monitor.  No other heart tests done.  Denies : recent Chest pain. Dizziness. Leg edema. Nitroglycerin use. Orthopnea. Palpitations. Paroxysmal nocturnal dyspnea. Shortness of breath. Syncope.   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    In her family, her maternal grandmother had pacemaker and valve replacement.  Maternal GF with MI.  Cousin had a valve repair.    Past Medical History:  Diagnosis Date  . Mitral prolapse     Past Surgical History:  Procedure Laterality Date  . CESAREAN SECTION  03/30/2008  . TUBAL LIGATION  2017     No current outpatient medications on file.   No current facility-administered medications for this visit.    Facility-Administered Medications Ordered in Other Visits  Medication Dose Route Frequency Provider Last Rate Last Dose  . gadopentetate dimeglumine (MAGNEVIST) injection 13 mL  13 mL Intravenous Once PRN Sater, Pearletha Furl, MD        Allergies:   Morphine; Morphine and related; Hydrocodone; and Latex    Social History:  The patient  reports that she has never smoked. She has never used smokeless tobacco. She reports that she drinks alcohol. She reports that she does not use drugs.   Family History:  The patient's family history includes Alcohol abuse in her  paternal grandfather; Alzheimer's disease in her paternal grandfather and paternal grandmother; Dementia in her paternal grandfather and paternal grandmother; Diabetes in her maternal grandfather and maternal grandmother; Heart attack in her maternal grandmother; Heart disease in her maternal grandfather and maternal grandmother.    ROS:  Please see the history of present illness.   Otherwise, review of systems are positive for move to [place] 2 years ago.   All other systems are reviewed and negative.    PHYSICAL EXAM: VS:  BP 110/82   Pulse 67   Ht 5' 1.5" (1.562 m)   Wt 148 lb (67.1 kg)   SpO2 98%   BMI 27.51 kg/m  , BMI Body mass index is 27.51 kg/m. GEN: Well nourished, well developed, in no acute distress  HEENT: normal  Neck: no JVD, carotid bruits, or masses Cardiac: RRR; no murmurs, rubs, or gallops,no edema ; no audible midsystolic click Respiratory:  clear to auscultation bilaterally, normal work of breathing GI: soft, nontender, nondistended, + BS MS: no deformity or atrophy  Skin: warm and dry, no rash Neuro:  Strength and sensation are intact Psych: euthymic mood, full affect   EKG:   The ekg ordered today demonstrates NSR, nonspecific ST changes   Recent Labs: 10/28/2017: ALT 10; BUN 8; Creatinine, Ser 0.74; Hemoglobin 12.4; Platelets 284; Potassium 3.9; Sodium 142   Lipid Panel No results found for: CHOL, TRIG, HDL,  CHOLHDL, VLDL, LDLCALC, LDLDIRECT   Other studies Reviewed: Additional studies/ records that were reviewed today with results demonstrating: await records; no lipids checked.   ASSESSMENT AND PLAN:  1. Mitral valve prolapse: Diagnosed in [place].  Will try to obtain records from [place], [place]; Dr. Melanie Crazier.  Exam is benign.  No sign of regurgitation or CHF.  No need for echo at this time.  No need for SBE prophylaxis based on her report. 2. Screeing for lipid disorders: Check fasting lipid profile given family h/o CAD. 3. Increase exercise to  the target below.    Current medicines are reviewed at length with the patient today.  The patient concerns regarding her medicines were addressed.  The following changes have been made:  No change  Labs/ tests ordered today include:  No orders of the defined types were placed in this encounter.   Recommend 150 minutes/week of aerobic exercise Low fat, low carb, high fiber diet recommended  Disposition:   FU in 1 year   Signed, Lance Muss, MD  03/07/2018 3:02 PM    [facility] [address] [place], [place], [place]  [address] Phone: [phone]; Fax: [phone]

## 2018-03-07 NOTE — Patient Instructions (Addendum)
Medication Instructions:  Your physician recommends that you continue on your current medications as directed. Please refer to the Current Medication list given to you today.   Labwork: Your physician recommends that you return for a FASTING lipid profile  Testing/Procedures: None ordered  Follow-Up: Your physician wants you to follow-up in: 1 year with Dr. Eldridge Dace. You will receive a reminder letter in the mail two months in advance. If you don't receive a letter, please call our office to schedule the follow-up appointment.   Any Other Special Instructions Will Be Listed Below (If Applicable).  We will request your echocardiogram records from Dr. Melanie Crazier   If you need a refill on your cardiac medications before your next appointment, please call your pharmacy.

## 2018-03-10 ENCOUNTER — Telehealth: Payer: Self-pay | Admitting: Interventional Cardiology

## 2018-03-10 NOTE — Telephone Encounter (Signed)
Records received from Dr.Brian Remington's Office placed in Chart Prep.

## 2018-11-01 ENCOUNTER — Ambulatory Visit (HOSPITAL_BASED_OUTPATIENT_CLINIC_OR_DEPARTMENT_OTHER)
Admission: RE | Admit: 2018-11-01 | Payer: BC Managed Care – PPO | Source: Home / Self Care | Admitting: Obstetrics and Gynecology

## 2018-11-09 ENCOUNTER — Other Ambulatory Visit: Payer: Self-pay | Admitting: Obstetrics and Gynecology

## 2018-12-18 ENCOUNTER — Encounter (HOSPITAL_BASED_OUTPATIENT_CLINIC_OR_DEPARTMENT_OTHER): Admission: RE | Payer: Self-pay | Source: Home / Self Care

## 2018-12-18 SURGERY — XI ROBOTIC ASSISTED LAPAROSCOPIC HYSTERECTOMY AND SALPINGECTOMY
Anesthesia: General | Laterality: Bilateral

## 2019-02-02 DIAGNOSIS — I209 Angina pectoris, unspecified: Secondary | ICD-10-CM

## 2019-02-02 HISTORY — DX: Angina pectoris, unspecified: I20.9

## 2019-02-21 ENCOUNTER — Other Ambulatory Visit: Payer: Self-pay | Admitting: Obstetrics and Gynecology

## 2019-03-07 NOTE — H&P (Signed)
Suzanne Tucker is a 38 y.o. female, P: 1-0-0-1, who presents for hysterectomy  because of chronic pelvic pain.  For over a year, the patient has experienced cyclical pelvic pain that is  debilitating and is rated at a 10/10 on a 10 point pain scale.  She has found   minimal relief with Naproxen (decreases to 7/10), enabling her  to function.  In 2017 the patient underwent an endometrial ablation for menorrhagia so she does not have a menstrual flow but these sharp pain will occur when she would otherwise be having her period and will last for about a week.   She denies changes  in bowel or bladder function, dyspareunia, fever or vaginitis symptoms. A pelvic ultrasound i November 2019: anteverted uterus: (from fundus to external os-6.5 cm); 6.58 x 5.30 x 3.50 cm with endometrium: 3.23 mm; right ovary-4.33 cm and left ovary-3.86 cm; #1 corpus, transmural fibroid-1.9 cm and scattered adenomyosis at fundus.  A discussion was held with the patient regarding medical and surgical management for her symptoms however,  since she no longer wants to bear children,  she has chosen definitive therapy in the form of hysterectomy.   Past Medical History  OB History: G:1  P: 1-0-0-1;  C-section 2008  GYN History: menarche: 38 YO    LMP: none due to ablation   Contraception:Tubal Sterlization;   Denies history of abnormal PAP smear or sexually transmitted infections.   Last PAP smear: 2017- normal  Medical History: Mitral Valve Prolapse  Surgical History: 2017 Tubal Sterilization, Hysteroscopy, Dilatation & Curettage and Endometrial Ablation Denies problems with anesthesia or history of blood transfusions  Family History: Diabetes Mellitus, Heart Disease and Alzheimers  Social History:  Divorced and employed as a Runner, broadcasting/film/video;  Denies tobacco or alcohol use   Medications: Naproxen Sodium 550 mg  1  tablet po pc bid prn  Allergies  Allergen Reactions  . Morphine Shortness Of Breath  . Morphine And Related  Anaphylaxis  . Hydrocodone Hives  . Latex Rash  Percocet causes hives  Denies sensitivity to peanuts, shellfish, soy  or adhesives.   ROS: Admits to occasional chest pain with caffeine intake (related to MVP)  but  denies headache, vision changes, nasal congestion, dysphagia, tinnitus, dizziness, hoarseness, cough, shortness of breath, nausea, vomiting, diarrhea,constipation,  urinary frequency, urgency  dysuria, hematuria, vaginitis symptoms, pelvic pain, swelling of joints,easy bruising,  myalgias, arthralgias, skin rashes, unexplained weight loss and except as is mentioned in the history of present illness, patient's review of systems is otherwise negative.    Physical Exam  Bp: 110/82  P: 84 bpm  R: 16  Temperature: 97.3 degrees F orally  Weight: 143 lbs.  Height: 5\' 1"   BMI: 27  Neck: supple without masses or thyromegaly Lungs: clear to auscultation Heart: regular rate and rhythm Abdomen: soft, non-tender and no organomegaly Pelvic:EGBUS- wnl; vagina-normal rugae; uterus-normal size, mildly tender, cervix without lesions or motion tenderness; adnexae-no tenderness or masses Extremities:  no clubbing, cyanosis or edema   Assesment: Chronic Pelvic Pain                       Adenomyosis                       Uterine Fibroid  Disposition:  The patient was given the indication for her procedure(s) along with the risks, which include but are not limited to, reaction to anesthesia, damage to adjacent organs, infection, excessive bleeding, formation  of scar tissue, early menopause, pelvic prolapse and the possible need for an open abdominal incision. She was further advised that she will experience transient post operative facial edema, that her hospital stay is expected to be 0-2 days, she should be able to return to her usual activities within 2-3 weeks (except intercourse to be delayed until 6 weeks) and that the robotic approach to her surgery requires more time to perform than an open  abdominal approach.  Patient was given a  Miralax bowel prep to be completed the day  before her  procedure.  She verbalized understanding of these risks and pre-operative instructions and has consented to proceed with a Robot Assisted Total Laparopscopic Hysterectomy at [facility] on March 15, 2019.  CSN# [id number]   Larose Batres J. Lowell Guitar, PA-C  for Dr. Crist Fat. Rivard

## 2019-03-09 NOTE — Patient Instructions (Addendum)
Your procedure is scheduled on  Thursday 03/15/2019   Report to [facility] AT  0530   A. M.   Call this number if you have problems the morning of surgery  :[phone].   OUR ADDRESS IS [address].  WE ARE LOCATED IN [facility].                                     REMEMBER:   DO NOT EAT FOOD OR DRINK LIQUIDS AFTER MIDNIGHT .    TAKE THESE MEDICATIONS MORNING OF SURGERY WITH A SIP OF WATER:   NONE  IF YOU ARE SPENDING THE NIGHT AFTER SURGERY PLEASE BRING ALL YOUR PRESCRIPTION MEDICATIONS IN THEIR ORIGINAL BOTTLES.                                    DO NOT WEAR JEWERLY, MAKE UP, OR NAIL POLISH,  DO NOT WEAR LOTIONS, POWDERS, PERFUMES OR DEODORANT. DO NOT SHAVE FOR 24 HOURS PRIOR TO DAY OF SURGERY.  CONTACTS, GLASSES, OR DENTURES MAY NOT BE WORN TO SURGERY.                                    [facility] IS NOT RESPONSIBLE  FOR ANY BELONGINGS.                                                                    Marland Kitchen                                                                                                    [facility] - Preparing for Surgery  Before surgery, you can play an important role.   Because skin is not sterile, your skin needs to be as free of germs as possible.  You can reduce the number of germs on your skin by washing with CHG (chlorahexidine gluconate) soap before surgery.   CHG is an antiseptic cleaner which kills germs and bonds with the skin to continue killing germs even after washing. Please DO NOT use if you have an allergy to CHG or antibacterial soaps .  If your skin becomes reddened/irritated stop using the CHG and inform your nurse when you arrive at Short Stay. Do not shave (including legs and underarms) for at least 48 hours prior to the first CHG shower.   Please follow these instructions carefully:   1.  Shower with CHG Soap the night before surgery and the  morning of Surgery.  2.  If you choose to wash  your hair, wash your hair first as usual with  your  normal  shampoo.  3.  After you shampoo, rinse your hair and body thoroughly to remove the  shampoo.                                        4.  Use CHG as you would any other liquid soap.  You can apply chg directly  to the skin and wash                       Gently with a scrungie or clean washcloth.  5.  Apply the CHG Soap to your body ONLY FROM THE NECK DOWN.   Do not use on face/ open                           Wound or open sores. Avoid contact with eyes, ears mouth and genitals (private parts).                       Wash face,  Genitals (private parts) with your normal soap.             6.  Wash thoroughly, paying special attention to the area where your surgery  will be performed.  7.  Thoroughly rinse your body with warm water from the neck down.  8.  DO NOT shower/wash with your normal soap after using and rinsing off  the CHG Soap.                9.  Pat yourself dry with a clean towel.            10.  Wear clean pajamas.            11.  Place clean sheets on your bed the night of your first shower and do not  sleep with pets.  Day of Surgery : Do not apply any lotions/deodorants the morning of surgery.  Please wear clean clothes to the hospital/surgery center.  FAILURE TO FOLLOW THESE INSTRUCTIONS MAY RESULT IN THE CANCELLATION OF YOUR SURGERY PATIENT SIGNATURE_________________________________  NURSE SIGNATURE__________________________________  ________________________________________________________________________

## 2019-03-12 ENCOUNTER — Other Ambulatory Visit: Payer: Self-pay

## 2019-03-12 ENCOUNTER — Encounter (HOSPITAL_COMMUNITY): Payer: Self-pay

## 2019-03-12 ENCOUNTER — Encounter (HOSPITAL_COMMUNITY)
Admission: RE | Admit: 2019-03-12 | Discharge: 2019-03-12 | Disposition: A | Discharge status: Home / Self Care | Payer: 59 | Source: Ambulatory Visit | Attending: Obstetrics and Gynecology | Admitting: Obstetrics and Gynecology

## 2019-03-12 DIAGNOSIS — G8929 Other chronic pain: Secondary | ICD-10-CM | POA: Diagnosis not present

## 2019-03-12 DIAGNOSIS — N736 Female pelvic peritoneal adhesions (postinfective): Secondary | ICD-10-CM | POA: Diagnosis not present

## 2019-03-12 DIAGNOSIS — Z01818 Encounter for other preprocedural examination: Secondary | ICD-10-CM | POA: Insufficient documentation

## 2019-03-12 DIAGNOSIS — N800 Endometriosis of uterus: Secondary | ICD-10-CM | POA: Diagnosis not present

## 2019-03-12 DIAGNOSIS — R102 Pelvic and perineal pain: Secondary | ICD-10-CM | POA: Diagnosis present

## 2019-03-12 DIAGNOSIS — Z1159 Encounter for screening for other viral diseases: Secondary | ICD-10-CM | POA: Insufficient documentation

## 2019-03-12 LAB — CBC
HCT: 37.9 % (ref 36.0–46.0)
Hemoglobin: 12.3 g/dL (ref 12.0–15.0)
MCH: 30 pg (ref 26.0–34.0)
MCHC: 32.5 g/dL (ref 30.0–36.0)
MCV: 92.4 fL (ref 80.0–100.0)
Platelets: 229 10*3/uL (ref 150–400)
RBC: 4.1 MIL/uL (ref 3.87–5.11)
RDW: 11.9 % (ref 11.5–15.5)
WBC: 5.9 10*3/uL (ref 4.0–10.5)
nRBC: 0 % (ref 0.0–0.2)

## 2019-03-12 NOTE — Progress Notes (Signed)
  Konrad Felix PA  Patient has a history of mitral valve Prolapse without reguritation..  She has chest pain with it. Last episode was 5/20. Her cardiologist is in [place]. Dr. Mike Craze [place] in [place].  She was seen in may for chest pain by Dr. Lendell Caprice. In the ED here in [place]. Her last ECHO was 2015.  ASA was stopped 03/09/19 per Dr. Roosevelt Locks

## 2019-03-13 ENCOUNTER — Telehealth: Payer: Self-pay | Admitting: Interventional Cardiology

## 2019-03-13 ENCOUNTER — Other Ambulatory Visit (HOSPITAL_COMMUNITY)
Admission: RE | Admit: 2019-03-13 | Discharge: 2019-03-13 | Disposition: A | Discharge status: Home / Self Care | Payer: 59 | Source: Ambulatory Visit | Attending: Obstetrics and Gynecology | Admitting: Obstetrics and Gynecology

## 2019-03-13 DIAGNOSIS — R102 Pelvic and perineal pain: Secondary | ICD-10-CM | POA: Diagnosis not present

## 2019-03-13 LAB — ABO/RH: ABO/RH(D): AB NEG

## 2019-03-13 NOTE — Telephone Encounter (Signed)
Actually would make office visit to be able to hear heart sounds.

## 2019-03-13 NOTE — Telephone Encounter (Signed)
   [place], MD  Chart reviewed as part of pre-operative protocol coverage. Because of Suzanne Tucker's past medical history and time since last visit, he/she will require a follow-up visit in order to better assess preoperative cardiovascular risk.  Pre-op covering staff: - Please schedule appointment and call patient to inform them. Should be in office visit.  - Please contact requesting surgeon's office via preferred method (i.e, phone, fax) to inform them of need for appointment prior to surgery. Surgery is scheduled for 6/11 so will probably need to be rescheduled after her evaluation.   If applicable, this message will also be routed to pharmacy pool and/or primary cardiologist for input on holding anticoagulant/antiplatelet agent as requested below so that this information is available at time of patient's appointment.   Daune Perch, NP  03/13/2019, 5:04 PM

## 2019-03-13 NOTE — Telephone Encounter (Signed)
Pt had EKG for preop and this showed some new T wave abnormality so they requested cardiology to evaluate. There is some mild change with mild TWI  In leads III, aVF and V3. She has previous nonspecific T wave abn in V1-V3. This is likely non concerning, however, when I called the patient she reports that she is having stronger palpitations and some associated chest tightness and shortness of breath. This occurs related to palpitations and not with exertion. With her MVP she would feel better if she had an echo prior to her surgery. Her father is an MD and advised her that this would be prudent.   It has been a year since her last office visit.  I will send to preop call back to have pt scheduled for preop virtual visit for clearance. We discussed that her surgery will likely need to be post poned until after evaluation.

## 2019-03-13 NOTE — Telephone Encounter (Signed)
New Message               [facility] Pre-operative Risk Assessment    Request for surgical clearance:  1. What type of surgery is being performed? Hysterectomy  2. When is this surgery scheduled? 03/15/2019  3. What type of clearance is required (medical clearance vs. Pharmacy clearance to hold med vs. Both)? Medical  4. Are there any medications that need to be held prior to surgery and how long? Per [name] they have the pharmacy info  5. Practice name and name of physician performing surgery? [facility]  6. What is your office phone number?[phone]   7.   What is your office fax number [phone]  8.   Anesthesia type (None, local, MAC, general) ? General    Suzanne Tucker 03/13/2019, 3:29 PM  _________________________________________________________________   (provider comments below)

## 2019-03-13 NOTE — Progress Notes (Addendum)
Anesthesia Chart Review   Case:  [id number] Date/Time:  03/15/19 0700   Procedure:  XI ROBOTIC ASSISTED TOTAL HYSTERECTOMY with Bilateral Salpingectomy (Bilateral )   Anesthesia type:  General   Pre-op diagnosis:  Chronic Pelvic Pain with Suspicion of Adenomysis   Location:  [facility] OR ROOM .5 / [place]   Surgeon:  Delsa Bern, MD      DISCUSSION: 38 yo never smoker with h/o MVP, chronic pelvic pain with suspicion of adenomyosis scheduled for above procedure 03/15/19 with Dr. [name] Rivard.    Pt seen by cardiologist, Dr. Larae Grooms, 03/07/2018.  Per his note, "Exam is benign.  No sign of regurgitation or CHF.  No need for echo at this time.  No need for SBE prophylaxis based on her report." 1 year follow up recommended.    Pt reports to nurse that she occassionally experiences chest pain with last episode was last month.  Asymptomatic at PAT visit. She has not seen cardiologist since last visit 03/07/18.  Cardiac clearance requested due to recent episodes of chest pain.    Addendum 03/14/2019:  Pt seen by cardiologist, Dr. Candee Furbish, today 03/14/2019.  Per his OV note, "She may proceed with GYN surgery with low overall cardiac risk.  Her echocardiogram demonstrated normal LV function.  There were no signs of any wall motion abnormalities.  EKG likely normal variant for her.  Nonspecific ST-T wave changes.  No high risk symptoms.  COVID-19 negative." Echo repeated today, EF 60-65%, Minimal thickening of the mitral valve, no significant prolapse detected.  Trivial mitral regurgitation.  Pt can proceed with planned procedure barring acute status change.  VS: BP 115/81 (BP Location: Left Arm)   Pulse 100   Temp 37.1 C (Oral)   Resp 18   Ht 5\' 1"  (1.549 m)   Wt 64 kg   SpO2 100%   BMI 26.64 kg/m   PROVIDERS: Horald Pollen, MD  Larae Grooms, MD is Cardiologist  LABS: Labs reviewed: Acceptable for surgery. (all labs ordered are listed, but only abnormal  results are displayed)  Labs Reviewed  CBC  TYPE AND SCREEN  ABO/RH     IMAGES:   EKG: 03/12/2019 Rate 78 Normal sinus rhythm ST & T wave abnormality, consider inferior ischemia Abnormal ECG No significant change since last tracing  CV: Echo 03/14/2019 IMPRESSIONS   1. The left ventricle has normal systolic function with an ejection fraction of 60-65%. The cavity size was normal. Left ventricular diastolic parameters were normal. No evidence of left ventricular regional wall motion abnormalities.  2. The average left ventricular global longitudinal strain is 23.8 %.  3. The right ventricle has normal systolic function. The cavity was normal. There is no increase in right ventricular wall thickness.  4. The aortic valve is tricuspid. Mild sclerosis of the aortic valve. Past Medical History:  Diagnosis Date  . Anginal pain ([place]) 02/2019   related to MVP  . Mitral prolapse     Past Surgical History:  Procedure Laterality Date  . CESAREAN SECTION  03/30/2008  . TUBAL LIGATION  2017    MEDICATIONS: . aspirin EC 81 MG tablet   No current facility-administered medications for this encounter.    . gadopentetate dimeglumine (MAGNEVIST) injection 13 mL    Maia Plan [facility] Pre-Surgical Testing [phone] 03/13/19 4:33 PM

## 2019-03-14 ENCOUNTER — Encounter: Payer: Self-pay | Admitting: Cardiology

## 2019-03-14 ENCOUNTER — Ambulatory Visit (HOSPITAL_BASED_OUTPATIENT_CLINIC_OR_DEPARTMENT_OTHER): Payer: 59

## 2019-03-14 ENCOUNTER — Telehealth: Payer: Self-pay | Admitting: Cardiology

## 2019-03-14 ENCOUNTER — Telehealth: Payer: Self-pay

## 2019-03-14 ENCOUNTER — Other Ambulatory Visit: Payer: Self-pay

## 2019-03-14 ENCOUNTER — Encounter: Payer: Self-pay | Admitting: *Deleted

## 2019-03-14 ENCOUNTER — Ambulatory Visit (INDEPENDENT_AMBULATORY_CARE_PROVIDER_SITE_OTHER): Payer: 59 | Admitting: Cardiology

## 2019-03-14 VITALS — BP 126/80 | HR 85 | Ht 61.0 in | Wt 143.0 lb

## 2019-03-14 DIAGNOSIS — I341 Nonrheumatic mitral (valve) prolapse: Secondary | ICD-10-CM

## 2019-03-14 DIAGNOSIS — Z01810 Encounter for preprocedural cardiovascular examination: Secondary | ICD-10-CM

## 2019-03-14 DIAGNOSIS — R102 Pelvic and perineal pain: Secondary | ICD-10-CM | POA: Diagnosis not present

## 2019-03-14 LAB — NOVEL CORONAVIRUS, NAA (HOSP ORDER, SEND-OUT TO REF LAB; TAT 18-24 HRS): SARS-CoV-2, NAA: NOT DETECTED

## 2019-03-14 NOTE — Progress Notes (Signed)
echo

## 2019-03-14 NOTE — Progress Notes (Signed)
Spoke w/ anesthesia, Konrad Felix PA, pt has cardiac clearance from dr Marlou Porch today , in epic and chart. Ok to proceed.

## 2019-03-14 NOTE — Telephone Encounter (Signed)
Pt has appt today with Dr. Marlou Porch at 4:20 p.m. Pt is aware and verbalized understanding. Pt has been pre-screened with covid-19 questions and was informed to arrive 15 minutes early and wear a mask. Pt thanked me for the call.

## 2019-03-14 NOTE — Anesthesia Preprocedure Evaluation (Addendum)
Anesthesia Evaluation  Patient identified by MRN, date of birth, ID band Patient awake    Reviewed: Allergy & Precautions, H&P , NPO status , Patient's Chart, lab work & pertinent test results  Airway Mallampati: II  TM Distance: >3 FB Neck ROM: Full    Dental no notable dental hx.    Pulmonary neg pulmonary ROS,    Pulmonary exam normal breath sounds clear to auscultation       Cardiovascular Exercise Tolerance: Good + angina negative cardio ROS Normal cardiovascular exam Rhythm:Regular Rate:Normal  Echo 6/20  The left ventricle has normal systolic function with an ejection fraction of 60-65%. The cavity size was normal. Left ventricular diastolic parameters were normal.   Neuro/Psych negative neurological ROS  negative psych ROS   GI/Hepatic negative GI ROS, Neg liver ROS,   Endo/Other  negative endocrine ROS  Renal/GU negative Renal ROS  negative genitourinary   Musculoskeletal negative musculoskeletal ROS (+)   Abdominal   Peds negative pediatric ROS (+)  Hematology negative hematology ROS (+)   Anesthesia Other Findings   Reproductive/Obstetrics negative OB ROS                            Anesthesia Physical Anesthesia Plan  ASA: II  Anesthesia Plan: General   Post-op Pain Management:    Induction: Intravenous  PONV Risk Score and Plan: 3 and Ondansetron, Dexamethasone, Treatment may vary due to age or medical condition and Midazolam  Airway Management Planned: Oral ETT  Additional Equipment:   Intra-op Plan:   Post-operative Plan: Extubation in OR  Informed Consent: I have reviewed the patients History and Physical, chart, labs and discussed the procedure including the risks, benefits and alternatives for the proposed anesthesia with the patient or authorized representative who has indicated his/her understanding and acceptance.       Plan Discussed with:  Anesthesiologist  Anesthesia Plan Comments: (See PAT note 03/12/2019, Konrad Felix, PA-C)       Anesthesia Quick Evaluation

## 2019-03-14 NOTE — Telephone Encounter (Signed)
Called and left a message on Dr. Letitia Caul cell phone.  She may proceed with surgery. Candee Furbish, MD

## 2019-03-14 NOTE — Telephone Encounter (Signed)
According to Lorriane Shire in Echo next available Echo time not until 03/19/19... will forward for 4:20 appt with the DOD.. Dr. Marlou Porch.

## 2019-03-14 NOTE — Telephone Encounter (Signed)
Pt to be at the office by 11:45am to be worked in for an Echo for preop clearance.         COVID-19 Pre-Screening Questions:  . In the past 7 to 10 days have you had a cough,  shortness of breath, headache, congestion, fever (100 or greater) body aches, chills, sore throat, or sudden loss of taste or sense of smell? NO . Have you been around anyone with known Covid 19.NO . Have you been around anyone who is awaiting Covid 19 test results in the past 7 to 10 days? NO . Have you been around anyone who has been exposed to Covid 19, or has mentioned symptoms of Covid 19 within the past 7 to 10 days?NO  If you have any concerns/questions about symptoms patients report during screening (either on the phone or at threshold). Contact the provider seeing the patient or DOD for further guidance.  If neither are available contact a member of the leadership team.

## 2019-03-14 NOTE — Telephone Encounter (Signed)
Delsa Bern, the surgeon doing the patient's hysterectomy tomorrow,  would like to have Dr. Marlou Porch call her directly after the appointment to go over the results of the visit. The number given [phone]) is the surgeon's personal cell phone.

## 2019-03-14 NOTE — Progress Notes (Signed)
Cardiology Office Note:    Date:  03/14/2019   ID:  Suzanne Tucker, DOB December 11, 1980, MRN [id number]  PCP:  Georgina Quint, MD  Cardiologist:  Lance Muss, MD  Electrophysiologist:  None   Referring MD: Georgina Quint, *   Operative evaluation, cardiac risk at the request of Dr. Estanislado Pandy  History of Present Illness:    Suzanne Tucker is a 38 y.o. female here for preoperative evaluation prior to hysterectomy at the request of Dr. Estanislado Pandy.  She had carried a diagnosis previously of mitral valve prolapse.  Echocardiogram performed today shows mild thickening of the mitral valve and trivial mitral regurgitation.  Certainly nothing significant or clinically significant.  She is able to complete 4 METS of activity without difficulty.  Denies any current chest pain fevers chills nausea vomiting syncope bleeding.  No early family history of sudden cardiac death or coronary artery disease.  She is a non-smoker, nondiabetic, very rare alcohol.  Back in March, she did have an episode of shortness of breath and chest discomfort.  This has since resolved.  During preop evaluation her EKG did demonstrate some nonspecific T wave changes with mild T wave inversions noted in the inferior leads as well as some of the precordial leads.  Echocardiogram was done which was reassuring.  Normal ejection fraction.   Sick in March, CP palps, SOB. Now well.     Past Medical History:  Diagnosis Date  . Anginal pain (HCC) 02/2019   related to MVP  . Mitral prolapse     Past Surgical History:  Procedure Laterality Date  . CESAREAN SECTION  03/30/2008  . TUBAL LIGATION  2017    Current Medications: No outpatient medications have been marked as taking for the 03/14/19 encounter (Office Visit) with Jake Bathe, MD.     Allergies:   Morphine; Morphine and related; Hydrocodone; Latex; and Codeine   Social History   Socioeconomic History  . Marital status: Single    Spouse  name: Not on file  . Number of children: Not on file  . Years of education: Not on file  . Highest education level: Not on file  Occupational History  . Not on file  Social Needs  . Financial resource strain: Not on file  . Food insecurity:    Worry: Not on file    Inability: Not on file  . Transportation needs:    Medical: Not on file    Non-medical: Not on file  Tobacco Use  . Smoking status: Never Smoker  . Smokeless tobacco: Never Used  Substance and Sexual Activity  . Alcohol use: Not Currently    Comment:    . Drug use: No  . Sexual activity: Never  Lifestyle  . Physical activity:    Days per week: Not on file    Minutes per session: Not on file  . Stress: Not on file  Relationships  . Social connections:    Talks on phone: Not on file    Gets together: Not on file    Attends religious service: Not on file    Active member of club or organization: Not on file    Attends meetings of clubs or organizations: Not on file    Relationship status: Not on file  Other Topics Concern  . Not on file  Social History Narrative  . Not on file     Family History: The patient's family history includes Alcohol abuse in her paternal grandfather; Alzheimer's disease in her paternal  grandfather and paternal grandmother; Dementia in her paternal grandfather and paternal grandmother; Diabetes in her maternal grandfather and maternal grandmother; Heart attack in her maternal grandmother; Heart disease in her maternal grandfather and maternal grandmother.  ROS:   Please see the history of present illness.     All other systems reviewed and are negative.  EKGs/Labs/Other Studies Reviewed:    The following studies were reviewed today: Echocardiogram personally reviewed-EF 65%, trace mitral regurgitation with mild mitral valve thickening.  No effusion.  Normal overall function.  EKG: 03/12/2019- sinus rhythm with nonspecific ST-T wave changes in the inferior leads as well as V3.   Personally reviewed  Recent Labs: 03/12/2019: Hemoglobin 12.3; Platelets 229  Recent Lipid Panel No results found for: CHOL, TRIG, HDL, CHOLHDL, VLDL, LDLCALC, LDLDIRECT  Physical Exam:    VS:  BP 126/80   Pulse 85   Ht 5\' 1"  (1.549 m)   Wt 143 lb (64.9 kg)   SpO2 99%   BMI 27.02 kg/m     Wt Readings from Last 3 Encounters:  03/14/19 143 lb (64.9 kg)  03/12/19 141 lb (64 kg)  03/07/18 148 lb (67.1 kg)     GEN:  Well nourished, well developed in no acute distress HEENT: Normal NECK: No JVD; No carotid bruits LYMPHATICS: No lymphadenopathy CARDIAC: RRR, no murmurs, rubs, gallops RESPIRATORY:  Clear to auscultation without rales, wheezing or rhonchi  ABDOMEN: Soft, non-tender, non-distended MUSCULOSKELETAL:  No edema; No deformity  SKIN: Warm and dry NEUROLOGIC:  Alert and oriented x 3 PSYCHIATRIC:  Normal affect   ASSESSMENT:    1. Preop cardiovascular exam   2. MVP (mitral valve prolapse)    PLAN:    In order of problems listed above:  Preoperative risk stratification, cardiac - She may proceed with GYN surgery with low overall cardiac risk.  Her echocardiogram demonstrated normal LV function.  There were no signs of any wall motion abnormalities.  EKG likely normal variant for her.  Nonspecific ST-T wave changes.  No high risk symptoms.  COVID-19 negative.  Mitral valve prolapse - Minimal thickening of the mitral valve, no significant prolapse detected.  Trivial mitral regurgitation.   Medication Adjustments/Labs and Tests Ordered: Current medicines are reviewed at length with the patient today.  Concerns regarding medicines are outlined above.  No orders of the defined types were placed in this encounter.  No orders of the defined types were placed in this encounter.   Patient Instructions  Medication Instructions:  No changes If you need a refill on your cardiac medications before your next appointment, please call your pharmacy.   Lab work: None If  you have labs (blood work) drawn today and your tests are completely normal, you will receive your results only by: Marland Kitchen MyChart Message (if you have MyChart) OR . A paper copy in the mail If you have any lab test that is abnormal or we need to change your treatment, we will call you to review the results.  Testing/Procedures: none  Follow-Up: Keep planned follow up with Dr. Irish Lack.  Any Other Special Instructions Will Be Listed Below (If Applicable). A letter has been faxed to Dr. Cletis Media at [facility] at fax #: [phone] re: cardiac clearance.      Signed, Candee Furbish, MD  03/14/2019 4:45 PM    [facility]

## 2019-03-14 NOTE — Telephone Encounter (Signed)
   Primary Cardiologist: Larae Grooms, MD  Chart reviewed as part of pre-operative protocol coverage. Given past medical history and time since last visit, based on ACC/AHA guidelines, Suzanne Tucker would be at acceptable risk for the planned procedure without further cardiovascular testing.   Pt was seen and examined by Dr. Marlou Porch today and had an echocardiogram. See documentation below.   Preoperative risk stratification, cardiac - She may proceed with GYN surgery with low overall cardiac risk.  Her echocardiogram demonstrated normal LV function.  There were no signs of any wall motion abnormalities.  EKG likely normal variant for her.  Nonspecific ST-T wave changes.  No high risk symptoms.  COVID-19 negative.  Mitral valve prolapse - Minimal thickening of the mitral valve, no significant prolapse detected.  Trivial mitral regurgitation.  I will route this recommendation to the requesting party via Epic fax function and remove from pre-op pool.  Please call with questions.  Lyda Jester, PA-C 03/14/2019, 2:02 PM

## 2019-03-14 NOTE — Telephone Encounter (Signed)

## 2019-03-14 NOTE — Telephone Encounter (Signed)
Follow Up:     Pt is calling to check on the status of her clearance. She is scheduled for surgery tomorrow.

## 2019-03-14 NOTE — Patient Instructions (Signed)
Medication Instructions:  No changes If you need a refill on your cardiac medications before your next appointment, please call your pharmacy.   Lab work: None If you have labs (blood work) drawn today and your tests are completely normal, you will receive your results only by: Marland Kitchen MyChart Message (if you have MyChart) OR . A paper copy in the mail If you have any lab test that is abnormal or we need to change your treatment, we will call you to review the results.  Testing/Procedures: none  Follow-Up: Keep planned follow up with Dr. Irish Lack.  Any Other Special Instructions Will Be Listed Below (If Applicable). A letter has been faxed to Dr. Cletis Media at [place] at fax #: [phone] re: cardiac clearance.

## 2019-03-15 ENCOUNTER — Ambulatory Visit (HOSPITAL_BASED_OUTPATIENT_CLINIC_OR_DEPARTMENT_OTHER)
Admission: RE | Admit: 2019-03-15 | Discharge: 2019-03-15 | Disposition: A | Discharge status: Home / Self Care | Payer: 59 | Attending: Obstetrics and Gynecology | Admitting: Obstetrics and Gynecology

## 2019-03-15 ENCOUNTER — Encounter (HOSPITAL_BASED_OUTPATIENT_CLINIC_OR_DEPARTMENT_OTHER): Payer: Self-pay

## 2019-03-15 ENCOUNTER — Ambulatory Visit (HOSPITAL_BASED_OUTPATIENT_CLINIC_OR_DEPARTMENT_OTHER): Payer: 59 | Admitting: Physician Assistant

## 2019-03-15 ENCOUNTER — Encounter (HOSPITAL_BASED_OUTPATIENT_CLINIC_OR_DEPARTMENT_OTHER)
Admission: RE | Disposition: A | Discharge status: Home / Self Care | Payer: Self-pay | Source: Home / Self Care | Attending: Obstetrics and Gynecology

## 2019-03-15 ENCOUNTER — Ambulatory Visit (HOSPITAL_BASED_OUTPATIENT_CLINIC_OR_DEPARTMENT_OTHER): Payer: 59 | Admitting: Anesthesiology

## 2019-03-15 DIAGNOSIS — R102 Pelvic and perineal pain unspecified side: Secondary | ICD-10-CM | POA: Diagnosis present

## 2019-03-15 DIAGNOSIS — G8929 Other chronic pain: Secondary | ICD-10-CM | POA: Insufficient documentation

## 2019-03-15 DIAGNOSIS — N803 Endometriosis of pelvic peritoneum, unspecified: Secondary | ICD-10-CM

## 2019-03-15 DIAGNOSIS — N800 Endometriosis of uterus: Secondary | ICD-10-CM | POA: Insufficient documentation

## 2019-03-15 DIAGNOSIS — N736 Female pelvic peritoneal adhesions (postinfective): Secondary | ICD-10-CM | POA: Insufficient documentation

## 2019-03-15 DIAGNOSIS — Z1159 Encounter for screening for other viral diseases: Secondary | ICD-10-CM | POA: Insufficient documentation

## 2019-03-15 HISTORY — PX: ROBOTIC ASSISTED TOTAL HYSTERECTOMY: SHX6085

## 2019-03-15 LAB — TYPE AND SCREEN
ABO/RH(D): AB NEG
Antibody Screen: NEGATIVE

## 2019-03-15 LAB — POCT PREGNANCY, URINE: Preg Test, Ur: NEGATIVE

## 2019-03-15 SURGERY — HYSTERECTOMY, TOTAL, ROBOT-ASSISTED
Anesthesia: General | Laterality: Bilateral

## 2019-03-15 MED ORDER — ONDANSETRON HCL 4 MG/2ML IJ SOLN
INTRAMUSCULAR | Status: DC | PRN
  Administered 2019-03-15: 4 mg via INTRAVENOUS

## 2019-03-15 MED ORDER — FENTANYL CITRATE (PF) 100 MCG/2ML IJ SOLN
INTRAMUSCULAR | Status: AC
  Filled 2019-03-15: qty 2

## 2019-03-15 MED ORDER — IBUPROFEN 600 MG PO TABS: ORAL_TABLET | ORAL | 1 refills | Status: DC

## 2019-03-15 MED ORDER — KETOROLAC TROMETHAMINE 30 MG/ML IJ SOLN
INTRAMUSCULAR | Status: AC
  Filled 2019-03-15: qty 1

## 2019-03-15 MED ORDER — SCOPOLAMINE 1 MG/3DAYS TD PT72
MEDICATED_PATCH | TRANSDERMAL | Status: AC
  Filled 2019-03-15: qty 1

## 2019-03-15 MED ORDER — FENTANYL CITRATE (PF) 250 MCG/5ML IJ SOLN
INTRAMUSCULAR | Status: AC
  Filled 2019-03-15: qty 5

## 2019-03-15 MED ORDER — DEXAMETHASONE SODIUM PHOSPHATE 10 MG/ML IJ SOLN
INTRAMUSCULAR | Status: AC
  Filled 2019-03-15: qty 1

## 2019-03-15 MED ORDER — ROCURONIUM BROMIDE 10 MG/ML (PF) SYRINGE
PREFILLED_SYRINGE | INTRAVENOUS | Status: AC
  Filled 2019-03-15: qty 10

## 2019-03-15 MED ORDER — SUGAMMADEX SODIUM 200 MG/2ML IV SOLN
INTRAVENOUS | Status: AC
  Filled 2019-03-15: qty 2

## 2019-03-15 MED ORDER — CEFAZOLIN SODIUM-DEXTROSE 2-4 GM/100ML-% IV SOLN
INTRAVENOUS | Status: AC
  Filled 2019-03-15: qty 100

## 2019-03-15 MED ORDER — ONDANSETRON HCL 4 MG/2ML IJ SOLN
INTRAMUSCULAR | Status: AC
  Filled 2019-03-15: qty 2

## 2019-03-15 MED ORDER — LACTATED RINGERS IV SOLN
INTRAVENOUS | Status: DC
  Administered 2019-03-15 (×2): via INTRAVENOUS
  Filled 2019-03-15: qty 1000

## 2019-03-15 MED ORDER — MIDAZOLAM HCL 5 MG/5ML IJ SOLN
INTRAMUSCULAR | Status: DC | PRN
  Administered 2019-03-15: 2 mg via INTRAVENOUS

## 2019-03-15 MED ORDER — ACETAMINOPHEN 160 MG/5ML PO SOLN
325.0000 mg | ORAL | Status: DC | PRN
  Filled 2019-03-15: qty 20.3

## 2019-03-15 MED ORDER — STERILE WATER FOR IRRIGATION IR SOLN: Status: DC | PRN

## 2019-03-15 MED ORDER — SUGAMMADEX SODIUM 200 MG/2ML IV SOLN
INTRAVENOUS | Status: DC | PRN
  Administered 2019-03-15: 200 mg via INTRAVENOUS

## 2019-03-15 MED ORDER — FENTANYL CITRATE (PF) 100 MCG/2ML IJ SOLN
25.0000 ug | INTRAMUSCULAR | Status: DC | PRN
  Administered 2019-03-15: 50 ug via INTRAVENOUS
  Filled 2019-03-15: qty 1

## 2019-03-15 MED ORDER — KETOROLAC TROMETHAMINE 30 MG/ML IJ SOLN
INTRAMUSCULAR | Status: DC | PRN
  Administered 2019-03-15: 30 mg via INTRAVENOUS

## 2019-03-15 MED ORDER — ONDANSETRON HCL 4 MG PO TABS
4.0000 mg | ORAL_TABLET | Freq: Four times a day (QID) | ORAL | Status: DC | PRN
  Filled 2019-03-15: qty 1

## 2019-03-15 MED ORDER — OXYCODONE HCL 5 MG PO TABS
5.0000 mg | ORAL_TABLET | Freq: Once | ORAL | Status: DC | PRN
  Filled 2019-03-15: qty 1

## 2019-03-15 MED ORDER — ONDANSETRON HCL 4 MG/2ML IJ SOLN
4.0000 mg | Freq: Four times a day (QID) | INTRAMUSCULAR | Status: DC | PRN
  Administered 2019-03-15: 4 mg via INTRAVENOUS
  Filled 2019-03-15: qty 2

## 2019-03-15 MED ORDER — OXYCODONE HCL 5 MG/5ML PO SOLN
5.0000 mg | Freq: Once | ORAL | Status: DC | PRN
  Filled 2019-03-15: qty 5

## 2019-03-15 MED ORDER — KETOROLAC TROMETHAMINE 60 MG/2ML IM SOLN
INTRAMUSCULAR | Status: DC | PRN
  Administered 2019-03-15: 30 mg via INTRAMUSCULAR

## 2019-03-15 MED ORDER — LIDOCAINE 2% (20 MG/ML) 5 ML SYRINGE
INTRAMUSCULAR | Status: AC
  Filled 2019-03-15: qty 5

## 2019-03-15 MED ORDER — MENTHOL 3 MG MT LOZG
1.0000 | LOZENGE | OROMUCOSAL | Status: DC | PRN
  Filled 2019-03-15: qty 9

## 2019-03-15 MED ORDER — SODIUM CHLORIDE 0.9 % IV SOLN
INTRAVENOUS | Status: DC | PRN
  Administered 2019-03-15: 10 mL
  Administered 2019-03-15: 09:00:00 103 mL

## 2019-03-15 MED ORDER — TRAMADOL HCL 50 MG PO TABS: 50.0000 mg | ORAL_TABLET | Freq: Four times a day (QID) | ORAL | 0 refills | Status: DC | PRN

## 2019-03-15 MED ORDER — SODIUM CHLORIDE 0.9 % IR SOLN
Status: DC | PRN
  Administered 2019-03-15: 3000 mL

## 2019-03-15 MED ORDER — ROCURONIUM BROMIDE 100 MG/10ML IV SOLN
INTRAVENOUS | Status: DC | PRN
  Administered 2019-03-15 (×2): 10 mg via INTRAVENOUS
  Administered 2019-03-15: 20 mg via INTRAVENOUS
  Administered 2019-03-15: 50 mg via INTRAVENOUS

## 2019-03-15 MED ORDER — PROPOFOL 10 MG/ML IV BOLUS
INTRAVENOUS | Status: AC
  Filled 2019-03-15: qty 40

## 2019-03-15 MED ORDER — SIMETHICONE 80 MG PO CHEW
80.0000 mg | CHEWABLE_TABLET | Freq: Four times a day (QID) | ORAL | Status: DC | PRN
  Filled 2019-03-15: qty 1

## 2019-03-15 MED ORDER — EPHEDRINE 5 MG/ML INJ
INTRAVENOUS | Status: AC
  Filled 2019-03-15: qty 10

## 2019-03-15 MED ORDER — FENTANYL CITRATE (PF) 100 MCG/2ML IJ SOLN
INTRAMUSCULAR | Status: DC | PRN
  Administered 2019-03-15 (×3): 50 ug via INTRAVENOUS
  Administered 2019-03-15 (×2): 25 ug via INTRAVENOUS
  Administered 2019-03-15: 100 ug via INTRAVENOUS
  Administered 2019-03-15: 50 ug via INTRAVENOUS

## 2019-03-15 MED ORDER — ONDANSETRON HCL 4 MG/2ML IJ SOLN
4.0000 mg | Freq: Once | INTRAMUSCULAR | Status: DC | PRN
  Filled 2019-03-15: qty 2

## 2019-03-15 MED ORDER — LIDOCAINE HCL (CARDIAC) PF 100 MG/5ML IV SOSY
PREFILLED_SYRINGE | INTRAVENOUS | Status: DC | PRN
  Administered 2019-03-15: 100 mg via INTRAVENOUS

## 2019-03-15 MED ORDER — LACTATED RINGERS IV SOLN
INTRAVENOUS | Status: DC
  Administered 2019-03-15: 13:00:00 via INTRAVENOUS
  Filled 2019-03-15: qty 1000

## 2019-03-15 MED ORDER — EPHEDRINE SULFATE 50 MG/ML IJ SOLN
INTRAMUSCULAR | Status: DC | PRN
  Administered 2019-03-15: 10 mg via INTRAVENOUS

## 2019-03-15 MED ORDER — CEFAZOLIN SODIUM-DEXTROSE 2-4 GM/100ML-% IV SOLN
2.0000 g | INTRAVENOUS | Status: AC
  Administered 2019-03-15: 2 g via INTRAVENOUS
  Filled 2019-03-15: qty 100

## 2019-03-15 MED ORDER — MIDAZOLAM HCL 2 MG/2ML IJ SOLN
INTRAMUSCULAR | Status: AC
  Filled 2019-03-15: qty 2

## 2019-03-15 MED ORDER — ACETAMINOPHEN 325 MG PO TABS
325.0000 mg | ORAL_TABLET | ORAL | Status: DC | PRN
  Filled 2019-03-15: qty 2

## 2019-03-15 MED ORDER — SCOPOLAMINE 1 MG/3DAYS TD PT72
MEDICATED_PATCH | TRANSDERMAL | Status: DC | PRN
  Administered 2019-03-15: 1 via TRANSDERMAL

## 2019-03-15 MED ORDER — DEXAMETHASONE SODIUM PHOSPHATE 4 MG/ML IJ SOLN
INTRAMUSCULAR | Status: DC | PRN
  Administered 2019-03-15: 10 mg via INTRAVENOUS

## 2019-03-15 MED ORDER — IBUPROFEN 600 MG PO TABS
600.0000 mg | ORAL_TABLET | Freq: Four times a day (QID) | ORAL | Status: DC | PRN
  Filled 2019-03-15: qty 1

## 2019-03-15 MED ORDER — PROPOFOL 10 MG/ML IV BOLUS
INTRAVENOUS | Status: DC | PRN
  Administered 2019-03-15: 150 mg via INTRAVENOUS
  Administered 2019-03-15: 50 mg via INTRAVENOUS

## 2019-03-15 MED ORDER — TRAMADOL HCL 50 MG PO TABS
50.0000 mg | ORAL_TABLET | Freq: Four times a day (QID) | ORAL | Status: DC | PRN
  Filled 2019-03-15: qty 1

## 2019-03-15 MED ORDER — MEPERIDINE HCL 25 MG/ML IJ SOLN
6.2500 mg | INTRAMUSCULAR | Status: DC | PRN
  Filled 2019-03-15: qty 1

## 2019-03-15 SURGICAL SUPPLY — 67 items
BARRIER ADHS 3X4 INTERCEED (GAUZE/BANDAGES/DRESSINGS) ×2 IMPLANT
BLADE LAP MORCELLATOR 15X9.5 (ELECTROSURGICAL) IMPLANT
BLADE MORCELLATOR EXT  12.5X15 (ELECTROSURGICAL)
BLADE MORCELLATOR EXT 12.5X15 (ELECTROSURGICAL) IMPLANT
CANISTER SUCT 3000ML PPV (MISCELLANEOUS) IMPLANT
COVER BACK TABLE 60X90IN (DRAPES) ×2 IMPLANT
COVER TIP SHEARS 8 DVNC (MISCELLANEOUS) ×1 IMPLANT
COVER TIP SHEARS 8MM DA VINCI (MISCELLANEOUS) ×1
COVER WAND RF STERILE (DRAPES) IMPLANT
DECANTER SPIKE VIAL GLASS SM (MISCELLANEOUS) ×4 IMPLANT
DEFOGGER SCOPE WARMER CLEARIFY (MISCELLANEOUS) ×2 IMPLANT
DERMABOND ADVANCED (GAUZE/BANDAGES/DRESSINGS) ×1
DERMABOND ADVANCED .7 DNX12 (GAUZE/BANDAGES/DRESSINGS) ×1 IMPLANT
DRAPE ARM DVNC X/XI (DISPOSABLE) ×4 IMPLANT
DRAPE COLUMN DVNC XI (DISPOSABLE) ×1 IMPLANT
DRAPE DA VINCI XI ARM (DISPOSABLE) ×4
DRAPE DA VINCI XI COLUMN (DISPOSABLE) ×1
DURAPREP 26ML APPLICATOR (WOUND CARE) ×2 IMPLANT
ELECT REM PT RETURN 9FT ADLT (ELECTROSURGICAL) ×2
ELECTRODE REM PT RTRN 9FT ADLT (ELECTROSURGICAL) ×1 IMPLANT
GAUZE 4X4 16PLY RFD (DISPOSABLE) ×2 IMPLANT
GLOVE BIO SURGEON STRL SZ7 (GLOVE) IMPLANT
GLOVE BIOGEL PI IND STRL 7.0 (GLOVE) ×5 IMPLANT
GLOVE BIOGEL PI INDICATOR 7.0 (GLOVE) ×5
GLOVE ECLIPSE 6.5 STRL STRAW (GLOVE) IMPLANT
GLOVE SURG SS PI 6.5 STRL IVOR (GLOVE) ×4 IMPLANT
GLOVE SURG SS PI 7.0 STRL IVOR (GLOVE) ×6 IMPLANT
GOWN STRL REUS W/ TWL LRG LVL3 (GOWN DISPOSABLE) ×7 IMPLANT
GOWN STRL REUS W/TWL LRG LVL3 (GOWN DISPOSABLE) ×7
IRRIG SUCT STRYKERFLOW 2 WTIP (MISCELLANEOUS) ×2
IRRIGATION SUCT STRKRFLW 2 WTP (MISCELLANEOUS) ×1 IMPLANT
LEGGING LITHOTOMY PAIR STRL (DRAPES) ×2 IMPLANT
MANIPULATOR ADVINCU DEL 3.0 PL (MISCELLANEOUS) ×2 IMPLANT
MANIPULATOR UTERINE 4.5 ZUMI (MISCELLANEOUS) ×2 IMPLANT
OBTURATOR OPTICAL STANDARD 8MM (TROCAR) ×1
OBTURATOR OPTICAL STND 8 DVNC (TROCAR) ×1
OBTURATOR OPTICALSTD 8 DVNC (TROCAR) ×1 IMPLANT
OCCLUDER COLPOPNEUMO (BALLOONS) ×2 IMPLANT
PACK ROBOT WH (CUSTOM PROCEDURE TRAY) ×2 IMPLANT
PACK ROBOTIC GOWN (GOWN DISPOSABLE) IMPLANT
PACK TRENDGUARD 450 HYBRID PRO (MISCELLANEOUS) ×1 IMPLANT
PAD PREP 24X48 CUFFED NSTRL (MISCELLANEOUS) ×2 IMPLANT
PROTECTOR NERVE ULNAR (MISCELLANEOUS) ×4 IMPLANT
SEAL CANN UNIV 5-8 DVNC XI (MISCELLANEOUS) ×3 IMPLANT
SEAL XI 5MM-8MM UNIVERSAL (MISCELLANEOUS) ×3
SEALER VESSEL DA VINCI XI (MISCELLANEOUS) ×1
SEALER VESSEL EXT DVNC XI (MISCELLANEOUS) ×1 IMPLANT
SET TRI-LUMEN FLTR TB AIRSEAL (TUBING) ×2 IMPLANT
STRIP CLOSURE SKIN 1/4X3 (GAUZE/BANDAGES/DRESSINGS) ×2 IMPLANT
SUT MNCRL AB 3-0 PS2 27 (SUTURE) ×4 IMPLANT
SUT VIC AB 0 CT1 27 (SUTURE) ×2
SUT VIC AB 0 CT1 27XBRD ANBCTR (SUTURE) ×2 IMPLANT
SUT VIC AB 1 CT1 36 (SUTURE) ×2 IMPLANT
SUT VICRYL 0 UR6 27IN ABS (SUTURE) ×2 IMPLANT
SUT VLOC 180 0 9IN  GS21 (SUTURE) ×1
SUT VLOC 180 0 9IN GS21 (SUTURE) ×1 IMPLANT
TIP RUMI ORANGE 6.7MMX12CM (TIP) IMPLANT
TIP UTERINE 5.1X6CM LAV DISP (MISCELLANEOUS) ×2 IMPLANT
TIP UTERINE 6.7X10CM GRN DISP (MISCELLANEOUS) IMPLANT
TIP UTERINE 6.7X6CM WHT DISP (MISCELLANEOUS) IMPLANT
TIP UTERINE 6.7X8CM BLUE DISP (MISCELLANEOUS) IMPLANT
TOWEL OR 17X26 10 PK STRL BLUE (TOWEL DISPOSABLE) ×2 IMPLANT
TRAY FOLEY W/BAG SLVR 16FR (SET/KITS/TRAYS/PACK)
TRAY FOLEY W/BAG SLVR 16FR ST (SET/KITS/TRAYS/PACK) IMPLANT
TRENDGUARD 450 HYBRID PRO PACK (MISCELLANEOUS) ×2
TROCAR PORT AIRSEAL 8X120 (TROCAR) ×2 IMPLANT
WATER STERILE IRR 1000ML POUR (IV SOLUTION) IMPLANT

## 2019-03-15 NOTE — Anesthesia Postprocedure Evaluation (Signed)
Anesthesia Post Note  Patient: Suzanne Tucker  Procedure(s) Performed: XI ROBOTIC ASSISTED TOTAL HYSTERECTOMY with Bilateral Salpingectomy (Bilateral )     Patient location during evaluation: PACU Anesthesia Type: General Level of consciousness: awake and alert Pain management: pain level controlled Vital Signs Assessment: post-procedure vital signs reviewed and stable Respiratory status: spontaneous breathing, nonlabored ventilation, respiratory function stable and patient connected to nasal cannula oxygen Cardiovascular status: blood pressure returned to baseline and stable Postop Assessment: no apparent nausea or vomiting Anesthetic complications: no    Last Vitals:  Vitals:   03/15/19 1220 03/15/19 1230  BP:  110/77  Pulse: (!) 57 75  Resp: (!) 22 20  Temp: (!) 36.3 C   SpO2: 100% 100%    Last Pain:  Vitals:   03/15/19 1220  TempSrc:   PainSc: 3                  Rasa Degrazia

## 2019-03-15 NOTE — Progress Notes (Signed)
SPOKE W/  _Melissa     SCREENING SYMPTOMS OF COVID 19:   COUGH--no  RUNNY NOSE--- no  SORE THROAT---no  NASAL CONGESTION----no  SNEEZING----no  SHORTNESS OF BREATH---no  DIFFICULTY BREATHING---no  TEMP >100.0 -----no  UNEXPLAINED BODY ACHES------no  CHILLS -------- no  HEADACHES ---------no  LOSS OF SMELL/ TASTE --------no    HAVE YOU OR ANY FAMILY MEMBER TRAVELLED PAST 14 DAYS OUT OF THE   COUNTY---yes STATE----yes [place] 2 weeks ago COUNTRY----no  HAVE YOU OR ANY FAMILY MEMBER BEEN EXPOSED TO ANYONE WITH COVID 19?  denies

## 2019-03-15 NOTE — Discharge Instructions (Signed)
Call [facility] @ [phone] if: ° °You have a temperature greater than or equal to 100.4 degrees Farenheit orally °You have pain that is not made better by the pain medication given and taken as directed °You have excessive bleeding or problems urinating ° °Take Colace (Docusate Sodium/Stool Softener) 100 mg 2-3 times daily while taking narcotic pain medicine to avoid constipation or until bowel movements are regular. °Take Ibuprofen 600 mg with food every 6 hours for 5 days  then as needed for pain ° °You may drive after 1 week °You may walk up steps ° °You may shower tomorrow °You may resume a regular diet ° °Keep incisions clean and dry °Do not lift over 15 pounds for 6 weeks ° °Avoid anything in vagina for 6 weeks  ° °

## 2019-03-15 NOTE — Interval H&P Note (Signed)
History and Physical Interval Note:  03/15/2019 7:30 AM  Suzanne Tucker  has presented today for surgery, with the diagnosis of Chronic Pelvic Pain with Suspicion of Adenomysis.  The various methods of treatment have been discussed with the patient and family. After consideration of risks, benefits and other options for treatment, the patient has consented to  Procedure(s): XI ROBOTIC Enders with Bilateral Salpingectomy (Bilateral) as a surgical intervention.  The patient's history has been reviewed, patient examined, no change in status, stable for surgery.  I have reviewed the patient's chart and labs.  Questions were answered to the patient's satisfaction.     [name] A Charnell Peplinski

## 2019-03-15 NOTE — Op Note (Signed)
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 Angie Favadono  Procedure: Robotically assisted total hysterectomy with bilateral salpingectomy  Surgeon: Dr. Dois DavenportSandra Aidan Caloca  Assistant: Henreitta LeberElmira Powell P.A.-C.  Estimated blood loss: 25 cc  Procedure:  After being informed of the planned procedure with possible complications including but not limited to bleeding, infection, injury to other organs, need for laparotomy, possible need for morcellation with risks and benefits reviewed, expected hospital stay and recovery, informed consent is obtained and patient is taken to or #5. She is placed in  lithotomy position on [place] with both arms padded and tucked on each side and bilateral knee-high sequential compressive devices. She is given general anesthesia with endotracheal intubation without any complication. She is prepped and draped in a sterile fashion. A three-way Foley catheter is inserted in her bladder.  Pelvic exam reveals: normal size anteverted uterus with 2 normal adnexa  A weighted speculum is inserted in the vagina and the anterior lip of the cervix is grasped with a tenaculum forcep. We proceed with a paracervical block and vaginal infiltration using ropivacaine 0.5% diluted 1 in 1 with saline. The uterus was then sounded at 6 cm. We easily dilate the cervix using Hegar dilator to  #27. We are unable to place the Rumi intrauterine manipulator due to a post-ablation partially obliterated cavity. After multiple attempts, we suture the 3.0 Koh ring to the Rmi handle and insert the Rumi in the uterus. The Koh ring is then sutured to the cervix. with 0 Vicryl.  Trocar placement is decided. We infiltrate the umbilicus with 10 cc of ropivacaine per protocol and perform a 8 mm semi-elliptical  incision which is brought down bluntly to the fascia. The fascia is identified and  grasped with Coker forceps. The fascia is incised with Mayo scissors. Peritoneum is entered bluntly. A pursestring suture of 0 Vicryl is placed on the fascia and a 8 mm Hassan trocar is easily inserted in the abdominal cavity held in placed with a Purstring suture. This allows for easy insufflation of a pneumoperitoneum using warmed CO2 at a maximum pressure of 15 mm of mercury. 60 cc of Ropivacaine 0.5 % diluted 1 in 1 is sent in the pelvis and the patient is positioned in reverse Trendelenburg. We then placed one 8mm robotic trocar on the left, one 8mm robotic trocar on the right and one 8 mm patient's side assistant trocar on the right  after infiltrating every site  with ropivacaine per protocol. The robot is docked on the left of the patient after positioning her in Trendelenburg. A vessel Seal  is inserted in arm #4 and a Long bipolar forceps is inserted in arm #2.  Preparation and docking is completed in 70 minutes.  Observation: anterior and posterior cul-de-sac are normal. Uterus is normal except for a 3 mm subserosal fibroid. Both tubes and ovaries are normal with 3 identified Filschie clips. There are adhesions between the sigmoid colon and the left pelvic wall. A 2 mm dark blue lesion with peritoneal retraction is identified in the left adnexal fossa. Liver and appendix are normal.  We start on the right side by sealing and sectioning  the mesosalpynx , the right utero-ovarian ligament and the right round ligament . This gives us entry into the retroperitoneal space with an easy dissection of the anterior broad ligament. This was opened all the way to the left round ligament.   We then proceed  with systematic dissection of the bladder over  the anterior vaginal cuff which is easily identified with the KOH ring. The plane of dissection is  confirmed with filling the bladder with 200 cc of saline. We are able to dissect the bladder 2 cm below the KOH ring. We then opened the posterior right broad  ligament all the way to the posterior KOH ring after identifying the full course of the right ureter.   We proceed on the left side with sealing and sectioning the  left round ligament , the left utero-ovarian ligament and  the mesosalpinx in between. Entry into the retroperitoneal space allows [place] to complete dissection of the bladder on the left side and skeletonized the all uterine vessels. The left broad ligament is then dissected all the way to the posterior KOH ring after identifying the full course of the left ureter. We systematically release adhesions on the sigmoid colon with sharp dissection. The endometriotic lesion is 1-2 mm above the left ureter. We attempt its resection using traction on the peritoneum and hydrodissection but are unable to complete due to the proximity of the ureter and mild bleeding.    With pressure on the KOH ring and the bladder fully dissected below we are able to seal and cut the uterine vessels on both sides at the level of the KOH ring.  The vaginal occluder is inflated and we proceed with a 360 colpotomy using an open monopolar scissors and freeing the uterus entirely with its tubes .  The uterus is delivered vaginally with simple traction. The vaginal occluder is reinserted in the vagina to maintain pneumoperitoneum.  Instruments are then modified for a suture cut in arm #4 and a long tip forcep in arm #2. We proceed with closure of the vaginal cuff with 2 running sutures of 0 V-Lock. We irrigated profusely with warm saline and confirm a satisfactory hemostasis as well as 2 normal ureters with good mobility and no dilatation. The vaginal cuff and ovaries are covered with Interceed.  All instruments are then removed and the robot is undocked. Console time: 1 hours and 25 minutes.  All trochars are removed under direct visualization after evacuating the pneumoperitoneum.  The fascia of the supraumbilical incision is closed with the previously placed pursestring  suture of 0 Vicryl. All incisions are then closed with subcuticular suture of 3-0 Monocryl and Dermabond.  A speculum is inserted in the vagina to confirm a adequate closure of the vaginal cuff and good hemostasis.  Instrument and sponge count is complete x2. Estimated blood loss is 25 cc. The procedure is well tolerated by the patient is taken to recovery room in a well and stable condition.  Specimen: Uterus and tubes weighing 47 g sent to pathology

## 2019-03-15 NOTE — Progress Notes (Signed)
Pt c/o tongue has "burning, fuzzy feeling to it like when I eat kiwis and have an allergic reaction". Also reported chin was "shaking". Visual inspection of tongue/mouth performed, no abnormalities noted. VSS, O2 97-100% RA. Denies SOB, itching, dysphasia. Dr. Gifford Shave notified and to bedside for assessment. Placed on continuous pulse ox, will continue to monitor.  Lyndel Pleasure, RN

## 2019-03-15 NOTE — Transfer of Care (Signed)
Immediate Anesthesia Transfer of Care Note  Patient: Suzanne Tucker  Procedure(s) Performed: Procedure(s) (LRB): XI ROBOTIC ASSISTED TOTAL HYSTERECTOMY with Bilateral Salpingectomy (Bilateral)  Patient Location: PACU  Anesthesia Type: General  Level of Consciousness: awake, sedated, patient cooperative and responds to stimulation  Airway & Oxygen Therapy: Patient Spontanous Breathing and Patient connected to [place] oxygen with soft FM   Post-op Assessment: Report given to PACU RN, Post -op Vital signs reviewed and stable and Patient moving all extremities  Post vital signs: Reviewed and stable  Complications: No apparent anesthesia complications

## 2019-03-15 NOTE — Anesthesia Procedure Notes (Signed)
Procedure Name: Intubation Date/Time: 03/15/2019 7:42 AM Performed by: Justice Rocher, CRNA Pre-anesthesia Checklist: Patient identified, Emergency Drugs available, Suction available and Patient being monitored Patient Re-evaluated:Patient Re-evaluated prior to induction Oxygen Delivery Method: Circle system utilized Preoxygenation: Pre-oxygenation with 100% oxygen Induction Type: IV induction Ventilation: Mask ventilation without difficulty Laryngoscope Size: Mac and 3 Grade View: Grade II Tube type: Oral Tube size: 7.5 mm Number of attempts: 1 Airway Equipment and Method: Stylet and Oral airway Placement Confirmation: ETT inserted through vocal cords under direct vision,  positive ETCO2 and breath sounds checked- equal and bilateral Secured at: 22 cm Tube secured with: Tape Dental Injury: Teeth and Oropharynx as per pre-operative assessment

## 2019-03-16 ENCOUNTER — Encounter (HOSPITAL_BASED_OUTPATIENT_CLINIC_OR_DEPARTMENT_OTHER): Payer: Self-pay | Admitting: Obstetrics and Gynecology

## 2019-04-17 ENCOUNTER — Telehealth: Payer: Self-pay

## 2019-04-17 NOTE — Telephone Encounter (Signed)
Called pt to see if they would like to switch to an OV on 7/20 with JV. Left message asking pt to call the office.

## 2019-04-19 ENCOUNTER — Encounter: Payer: Self-pay | Admitting: Interventional Cardiology

## 2019-04-19 NOTE — Telephone Encounter (Signed)
  Patient states she saw Dr Marlou Porch on 03/14/19 and wants to know if she needs to keep this appt with Dr Irish Lack or not. I see the appt with Skains as a no show but patient states she did see him.

## 2019-04-20 NOTE — Progress Notes (Signed)
Virtual Visit via Video Note   This visit type was conducted due to national recommendations for restrictions regarding the COVID-19 Pandemic (e.g. social distancing) in an effort to limit this patient's exposure and mitigate transmission in our community.  Due to her co-morbid illnesses, this patient is at least at moderate risk for complications without adequate follow up.  This format is felt to be most appropriate for this patient at this time.  All issues noted in this document were discussed and addressed.  A limited physical exam was performed with this format.  Please refer to the patient's chart for her consent to telehealth for [facility].   Date:  04/23/2019   ID:  Suzanne Tucker, Suzanne Tucker 03/11/1981, MRN [id number]  Patient Location: Home Provider Location: Home  PCP:  Georgina Quint, MD  Cardiologist:  Lance Muss, MD  Electrophysiologist:  None   Evaluation Performed:  Follow-Up Visit  Chief Complaint:  palpitations  History of Present Illness:    Suzanne Tucker is a 38 y.o. female who is being seen today for the evaluation of mitral valve prolapse at the request of Georgina Quint.  She is a Education officer, museum.  She carries this diagnosis for the past 5-6 years.  She was living in [place], [place]., Dr. Melanie Crazier.  Was not using antibiotics for dental appts.  In her family, her maternal grandmother had pacemaker and valve replacement.  Maternal GF with MI.  Cousin had a valve repair.  She has had palpitations, but has not worn a monitor.  No other heart tests done.  She had GYN surgery in June 2020 successfully.   The patient does not have symptoms concerning for COVID-19 infection (fever, chills, cough, or new shortness of breath).   She walks regularly.  No problems.    Past Medical History:  Diagnosis Date  . Anginal pain (HCC) 02/2019   related to MVP  . Mitral prolapse    Past Surgical History:  Procedure Laterality Date  .  CESAREAN SECTION  03/30/2008  . ROBOTIC ASSISTED TOTAL HYSTERECTOMY Bilateral 03/15/2019   Procedure: XI ROBOTIC ASSISTED TOTAL HYSTERECTOMY with Bilateral Salpingectomy;  Surgeon: Silverio Lay, MD;  Location: [facility];  Service: Gynecology;  Laterality: Bilateral;  . TUBAL LIGATION  2017     Current Meds  Medication Sig  . aspirin 81 MG chewable tablet Chew 81 mg by mouth daily.     Allergies:   Morphine, Morphine and related, Tessalon [benzonatate], Hydrocodone, Latex, and Codeine   Social History   Tobacco Use  . Smoking status: Never Smoker  . Smokeless tobacco: Never Used  Substance Use Topics  . Alcohol use: Not Currently    Comment:    . Drug use: No     Family Hx: The patient's family history includes Alcohol abuse in her paternal grandfather; Alzheimer's disease in her paternal grandfather and paternal grandmother; Dementia in her paternal grandfather and paternal grandmother; Diabetes in her maternal grandfather and maternal grandmother; Heart attack in her maternal grandmother; Heart disease in her maternal grandfather and maternal grandmother.  ROS:   Please see the history of present illness.    palpitations All other systems reviewed and are negative.   Prior CV studies:   The following studies were reviewed today:  2020 echo reviewed  Labs/Other Tests and Data Reviewed:    EKG:  NSR, inferior ST changes, similar to 2019 ECG  Recent Labs: 03/12/2019: Hemoglobin 12.3; Platelets 229   Recent Lipid Panel No results  found for: CHOL, TRIG, HDL, CHOLHDL, LDLCALC, LDLDIRECT  Wt Readings from Last 3 Encounters:  04/23/19 143 lb (64.9 kg)  03/15/19 138 lb (62.6 kg)  03/14/19 143 lb (64.9 kg)     Objective:    Vital Signs:  Ht 5\' 1"  (1.549 m)   Wt 143 lb (64.9 kg)   BMI 27.02 kg/m    VITAL SIGNS:  reviewed GEN:  no acute distress RESPIRATORY:  normal respiratory effort, symmetric expansion PSYCH:  normal affect exam limited by  video format  ASSESSMENT & PLAN:    1. MVP: No signs of CHF.   Trivial MR by most recent echo. Tolerated recent surgery well.  2. Palpitations: Stable.  No problems with recent surgery.  Worse with caffeine or alcohol.  No sustained palpitations or syncope.  3. Vegan diet.  Should have B12 checked  COVID-19 Education: The signs and symptoms of COVID-19 were discussed with the patient and how to seek care for testing (follow up with PCP or arrange E-visit).  The importance of social distancing was discussed today.  Time:   Today, I have spent 15 minutes with the patient with telehealth technology discussing the above problems.     Medication Adjustments/Labs and Tests Ordered: Current medicines are reviewed at length with the patient today.  Concerns regarding medicines are outlined above.   Tests Ordered: No orders of the defined types were placed in this encounter.   Medication Changes: No orders of the defined types were placed in this encounter.   Follow Up:  Virtual Visit or In Person in 1 year(s)  Signed, Larae Grooms, MD  04/23/2019 10:30 AM    [place]

## 2019-04-23 ENCOUNTER — Telehealth (INDEPENDENT_AMBULATORY_CARE_PROVIDER_SITE_OTHER): Payer: 59 | Admitting: Interventional Cardiology

## 2019-04-23 ENCOUNTER — Other Ambulatory Visit: Payer: Self-pay

## 2019-04-23 ENCOUNTER — Encounter: Payer: Self-pay | Admitting: Interventional Cardiology

## 2019-04-23 VITALS — Ht 61.0 in | Wt 143.0 lb

## 2019-04-23 DIAGNOSIS — Z01810 Encounter for preprocedural cardiovascular examination: Secondary | ICD-10-CM

## 2019-04-23 DIAGNOSIS — Z13220 Encounter for screening for lipoid disorders: Secondary | ICD-10-CM

## 2019-04-23 DIAGNOSIS — I341 Nonrheumatic mitral (valve) prolapse: Secondary | ICD-10-CM | POA: Diagnosis not present

## 2019-04-23 DIAGNOSIS — R002 Palpitations: Secondary | ICD-10-CM | POA: Diagnosis not present

## 2019-04-23 NOTE — Patient Instructions (Signed)
Medication Instructions:   Your physician recommends that you continue on your current medications as directed. Please refer to the Current Medication list given to you today.  If you need a refill on your cardiac medications before your next appointment, please call your pharmacy.   Lab work:  None ordered today  If you have labs (blood work) drawn today and your tests are completely normal, you will receive your results only by: . MyChart Message (if you have MyChart) OR . A paper copy in the mail If you have any lab test that is abnormal or we need to change your treatment, we will call you to review the results.  Testing/Procedures:  None ordered today  Follow-Up:  At [facility], you and your health needs are our priority.  As part of our continuing mission to provide you with exceptional heart care, we have created designated Provider Care Teams.  These Care Teams include your primary Cardiologist (physician) and Advanced Practice Providers (APPs -  Physician Assistants and Nurse Practitioners) who all work together to provide you with the care you need, when you need it. You will need a follow up appointment in 12 months.  Please call our office 2 months in advance to schedule this appointment.  You may see Jayadeep Varanasi, MD or one of the following Advanced Practice Providers on your designated Care Team:   Brittainy Simmons, PA-C Dayna Dunn, PA-C . Michele Lenze, PA-C     

## 2019-05-15 ENCOUNTER — Encounter: Payer: Self-pay | Admitting: Emergency Medicine

## 2019-06-18 ENCOUNTER — Ambulatory Visit: Payer: BC Managed Care – PPO | Admitting: Emergency Medicine

## 2019-06-18 ENCOUNTER — Encounter: Payer: Self-pay | Admitting: Emergency Medicine

## 2019-06-18 ENCOUNTER — Other Ambulatory Visit: Payer: Self-pay

## 2019-06-18 VITALS — BP 115/82 | HR 76 | Temp 99.0°F | Resp 16 | Ht 61.0 in | Wt 145.0 lb

## 2019-06-18 DIAGNOSIS — L989 Disorder of the skin and subcutaneous tissue, unspecified: Secondary | ICD-10-CM | POA: Diagnosis not present

## 2019-06-18 DIAGNOSIS — Z23 Encounter for immunization: Secondary | ICD-10-CM | POA: Diagnosis not present

## 2019-06-18 NOTE — Progress Notes (Signed)
Suzanne Tucker 38 y.o.   Chief Complaint  Patient presents with  . Nevus    on the back entire life and it is changing color, texture and size    HISTORY OF PRESENT ILLNESS: This is a 38 y.o. female complaining of skin lesion on her left shoulder area for the past 6 months.  Needs dermatology referral.  HPI   Prior to Admission medications   Medication Sig Start Date End Date Taking? Authorizing Provider  aspirin 81 MG chewable tablet Chew 81 mg by mouth daily.   Yes [provider]    Allergies  Allergen Reactions  . Morphine Shortness Of Breath  . Morphine And Related Anaphylaxis  . Tessalon [Benzonatate] Hives    Hives, itching, burning all over.   . Hydrocodone Hives  . Latex Rash  . Codeine Nausea And Vomiting  . Tramadol Other (See Comments)    Tingling in the mouth with swelling     Patient Active Problem List   Diagnosis Date Noted  . Chronic pelvic pain in female 03/15/2019  . 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 Carpal tunnel syndrome on right 11/02/2017  . MVP (mitral valve prolapse) 10/28/2017    Past Medical History:  Diagnosis Date  . Anginal pain (HCC) 02/2019   related to MVP  . Mitral prolapse     Past Surgical History:  Procedure Laterality Date  . CESAREAN SECTION  03/30/2008  . ROBOTIC ASSISTED TOTAL HYSTERECTOMY Bilateral 03/15/2019   Procedure: XI ROBOTIC ASSISTED TOTAL HYSTERECTOMY with Bilateral Salpingectomy;  Surgeon: Silverio Layivard, Sandra, MD;  [facility];  Service: Gynecology;  Laterality: Bilateral;  . TUBAL LIGATION  2017    Social History   Socioeconomic History  . Marital status: Single    Spouse name: Not on file  . Number of children: Not on file  . Years of education: Not on file  . Highest education level: Not on file  Occupational History  . Not on file  Social Needs  . Financial resource strain: Not on file  . Food insecurity    Worry: Not on file    Inability: Not on file  .  Transportation needs    Medical: Not on file    Non-medical: Not on file  Tobacco Use  . Smoking status: Never Smoker  . Smokeless tobacco: Never Used  Substance and Sexual Activity  . Alcohol use: Not Currently    Comment:    . Drug use: No  . Sexual activity: Never  Lifestyle  . Physical activity    Days per week: Not on file    Minutes per session: Not on file  . Stress: Not on file  Relationships  . Social Musicianconnections    Talks on phone: Not on file    Gets together: Not on file    Attends religious service: Not on file    Active member of club or organization: Not on file    Attends meetings of clubs or organizations: Not on file    Relationship status: Not on file  . Intimate partner violence    Fear of current or ex partner: Not on file    Emotionally abused: Not on file    Physically abused: Not on file    Forced sexual activity: Not on file  Other Topics Concern  . Not on file  Social History Narrative  . Not on file    Family History  Problem Relation Age of Onset  . Diabetes Maternal Grandmother   .  Heart disease Maternal Grandmother   . Heart attack Maternal Grandmother   . Diabetes Maternal Grandfather   . Heart disease Maternal Grandfather   . Alzheimer's disease Paternal Grandmother   . Dementia Paternal Grandmother   . Alzheimer's disease Paternal Grandfather   . Dementia Paternal Grandfather   . Alcohol abuse Paternal Grandfather      Review of Systems  Constitutional: Negative.  Negative for chills and fever.  HENT: Negative.  Negative for congestion and sore throat.   Respiratory: Negative.  Negative for cough and shortness of breath.   Cardiovascular: Negative.  Negative for chest pain and palpitations.  Gastrointestinal: Negative for abdominal pain, diarrhea, nausea and vomiting.  Skin:       Skin lesions  Neurological: Negative for dizziness and headaches.  All other systems reviewed and are negative.  Today's Vitals   06/18/19 1408   BP: 115/82  Pulse: 76  Resp: 16  Temp: 99 F (37.2 C)  TempSrc: Oral  SpO2: 99%  Weight: 145 lb (65.8 kg)  Height: 5\' 1"  (1.549 m)   Body mass index is 27.4 kg/m.   Physical Exam Vitals signs reviewed.  Constitutional:      Appearance: Normal appearance.  HENT:     Head: Normocephalic.  Eyes:     Extraocular Movements: Extraocular movements intact.     Pupils: Pupils are equal, round, and reactive to light.  Neck:     Musculoskeletal: Normal range of motion.  Cardiovascular:     Rate and Rhythm: Normal rate.  Pulmonary:     Effort: Pulmonary effort is normal.  Skin:    Findings: Lesion present.  Neurological:     General: No focal deficit present.     Mental Status: She is alert and oriented to person, place, and time.  Psychiatric:        Mood and Affect: Mood normal.        Behavior: Behavior normal.        ASSESSMENT & PLAN: [name] was seen today for nevus.  Diagnoses and all orders for this visit:  Skin lesion -     Ambulatory referral to Dermatology  Need for diphtheria-tetanus-pertussis (Tdap) vaccine -     Tdap vaccine greater than or equal to 7yo IM    Patient Instructions       If you have lab work done today you will be contacted with your lab results within the next 2 weeks.  If you have not heard from us then please contact us. The fastest way to get your results is to register for My Chart.   IF you received an x-ray today, you will receive an invoice from [facility]. Please contact [facility] at [phone] with questions or concerns regarding your invoice.   IF you received labwork today, you will receive an invoice from [facility]. Please contact [facility] at [phone] with questions or concerns regarding your invoice.   Our billing staff will not be able to assist you with questions regarding bills from these companies.  You will be contacted with the lab results as soon as they are available. The fastest  way to get your results is to activate your My Chart account. Instructions are located on the last page of this paperwork. If you have not heard from us regarding the results in 2 weeks, please contact this office.     Preventing Skin Cancer, Adult Skin cancer is the most common type of cancer. There are three main types. Squamous cell and  basal cell skin cancer are the most common. Melanoma skin cancer is the most dangerous type. Most skin cancers are caused by skin damage from exposure to ultraviolet (UV) light. UV light comes from the sun and from artificial tanning beds. Suntans and sunburns result from exposure to UV light. Skin cancer occurs most often in older people, but it is usually the result of damage done earlier in life. The tans and sunburns you get at any age can lead to skin cancer in the future. To help prevent this, you can take steps to protect yourself. What actions can I take to protect myself from skin cancer? Many people like to get a tan, especially in the summer or when on vacation. However, tan or burned skin is a sign of skin damage. It increases your risk for skin cancer. To lower your risk: Avoid exposure to UV light   Try to stay out of the sun between 10 a.m. and 4 p.m. whenever possible. This is when the sun is at its strongest. Seek the shade during this time.  Remember that you can also be exposed to UV rays on cloudy or hazy days. Nancy Fetter exposure can be risky year-round, not just in the summer.  Do not use a sunlamp, tanning bed, or tanning booth to get a tan. If you really want a tan, use an artificial tanning lotion.  Avoid getting sunburned. Sunburns are more common on bright sunny days, especially when you are in areas where the sun is reflected off water or snow. Use sunscreen and protective clothing   Always use sunscreen-either a cream, lotion, or spray-when you are out in the sun. Keep sunscreen handy, such as in your gym bag or in your car, so that you  will have it when you need it.  Use a sunscreen with a sun protection factor (SPF) of at least 53. Use an SPF of 30 or higher if you are in bright sun, especially when you are out in the snow or on the water.  Make sure your sunscreen protects you from UVA and UVB light.  Use an adequate amount of sunscreen to cover exposed areas of skin. Put it on 30 minutes before you go out. Reapply it every 2 hours or anytime you come out of the water.  When you are out in the sun, wear a broad-brimmed hat and clothing that covers your arms and legs. Wear wraparound sunglasses. Check your skin for changes  Check your skin often from head to toe to look for any changes in the size, color, or shape of any moles or freckles. Check for any new moles or moles that bleed or become itchy. See your health care provider if you notice changes.  Ask your health care provider about a total skin check. Ask if it should be part of your yearly physical or if you need to see a skin specialist (dermatologist). Take other preventive measures   Avoid exposure to harmful chemicals, such as arsenic. ? Have your home's water tested for arsenic and other chemicals. ? Take protective measures to avoid exposure to chemicals at work.  Do not smoke any tobacco products, such as cigarettes, cigars, pipes, and e-cigarettes. If you need help quitting, ask your health care provider.  Keep your immune system healthy. ? Stay up to date on all vaccines, including the human papillomavirus (HPV) vaccine. ? Eat at least 5 servings of fruits and vegetables every day. Why are these changes important? About 1 of every  5 people will get skin cancer. The best way to reduce your risk is to avoid skin damage from UV light. If you have teenagers in your house, they should know that just five bad sunburns as a teen could double their risk of skin cancer in the future. If you have younger children, always make sure to protect their skin from the  sun. These changes can help reduce your risk of skin cancer, and they will also provide other health benefits, such as the following:  Protecting your skin from the sun can help prevent painful sunburns, sun poisoning, and other skin damage and blemishes. This is especially important if: ? You have pale white skin, freckles, and red hair. ? You burn easily.  Avoiding exposure to harmful chemicals can help prevent damage to other tissues in your body, such as your lungs, and prevent other types of cancer.  Avoiding smoking tobacco can reduce your risk for other types of cancer and other health problems.  Eating a healthy diet is good for your overall health. What can happen if changes are not made? If you do not make these changes, you will be at higher risk for skin cancer. If you develop skin cancer, the treatments could result in lost time from work and changes in your appearance from scars. The most dangerous type of skin cancer, melanoma, can be deadly if not found early. Where to find support For more support, talk to your primary health care provider or dermatologist. Where to find more information Learn more about skin cancer from:  The Skin Cancer Foundation: www.skincancer.org/prevention  The Centers for Disease Control and Prevention: LatePreviews.co.ukwww.cdc.gov/cancer/skin/  The American Academy of Dermatology: InfoExam.siwww.aad.org Summary  Skin cancer is the most common type of cancer.  Melanoma skin cancer can be deadly if not found early.  Sunburns and tanning increase your risk for skin cancer.  Protecting your skin from UV light is the best way to prevent skin cancer. This information is not intended to replace advice given to you by your health care provider. Make sure you discuss any questions you have with your health care provider. Document Released: 11/14/2017 Document Revised: 01/12/2019 Document Reviewed: 11/14/2017 Elsevier Patient Education  2020 Elsevier Inc.      Edwina BarthMiguel  Antonina Deziel, MD [facility]

## 2019-06-18 NOTE — Patient Instructions (Addendum)
If you have lab work done today you will be contacted with your lab results within the next 2 weeks.  If you have not heard from us then please contact us. The fastest way to get your results is to register for My Chart.   IF you received an x-ray today, you will receive an invoice from [facility]. Please contact [facility] at [phone] with questions or concerns regarding your invoice.   IF you received labwork today, you will receive an invoice from [facility]. Please contact [facility] at [phone] with questions or concerns regarding your invoice.   Our billing staff will not be able to assist you with questions regarding bills from these companies.  You will be contacted with the lab results as soon as they are available. The fastest way to get your results is to activate your My Chart account. Instructions are located on the last page of this paperwork. If you have not heard from us regarding the results in 2 weeks, please contact this office.     Preventing Skin Cancer, Adult Skin cancer is the most common type of cancer. There are three main types. Squamous cell and basal cell skin cancer are the most common. Melanoma skin cancer is the most dangerous type. Most skin cancers are caused by skin damage from exposure to ultraviolet (UV) light. UV light comes from the sun and from artificial tanning beds. Suntans and sunburns result from exposure to UV light. Skin cancer occurs most often in older people, but it is usually the result of damage done earlier in life. The tans and sunburns you get at any age can lead to skin cancer in the future. To help prevent this, you can take steps to protect yourself. What actions can I take to protect myself from skin cancer? Many people like to get a tan, especially in the summer or when on vacation. However, tan or burned skin is a sign of skin damage. It increases your risk for skin cancer. To lower your risk: Avoid  exposure to UV light   Try to stay out of the sun between 10 a.m. and 4 p.m. whenever possible. This is when the sun is at its strongest. Seek the shade during this time.  Remember that you can also be exposed to UV rays on cloudy or hazy days. Wynelle LinkSun exposure can be risky year-round, not just in the summer.  Do not use a sunlamp, tanning bed, or tanning booth to get a tan. If you really want a tan, use an artificial tanning lotion.  Avoid getting sunburned. Sunburns are more common on bright sunny days, especially when you are in areas where the sun is reflected off water or snow. Use sunscreen and protective clothing   Always use sunscreen-either a cream, lotion, or spray-when you are out in the sun. Keep sunscreen handy, such as in your gym bag or in your car, so that you will have it when you need it.  Use a sunscreen with a sun protection factor (SPF) of at least 15. Use an SPF of 30 or higher if you are in bright sun, especially when you are out in the snow or on the water.  Make sure your sunscreen protects you from UVA and UVB light.  Use an adequate amount of sunscreen to cover exposed areas of skin. Put it on 30 minutes before you go out. Reapply it every 2 hours or anytime you come out of the water.  When you  are out in the sun, wear a broad-brimmed hat and clothing that covers your arms and legs. Wear wraparound sunglasses. Check your skin for changes  Check your skin often from head to toe to look for any changes in the size, color, or shape of any moles or freckles. Check for any new moles or moles that bleed or become itchy. See your health care provider if you notice changes.  Ask your health care provider about a total skin check. Ask if it should be part of your yearly physical or if you need to see a skin specialist (dermatologist). Take other preventive measures   Avoid exposure to harmful chemicals, such as arsenic. ? Have your home's water tested for arsenic and  other chemicals. ? Take protective measures to avoid exposure to chemicals at work.  Do not smoke any tobacco products, such as cigarettes, cigars, pipes, and e-cigarettes. If you need help quitting, ask your health care provider.  Keep your immune system healthy. ? Stay up to date on all vaccines, including the human papillomavirus (HPV) vaccine. ? Eat at least 5 servings of fruits and vegetables every day. Why are these changes important? About 1 of every 5 people will get skin cancer. The best way to reduce your risk is to avoid skin damage from UV light. If you have teenagers in your house, they should know that just five bad sunburns as a teen could double their risk of skin cancer in the future. If you have younger children, always make sure to protect their skin from the sun. These changes can help reduce your risk of skin cancer, and they will also provide other health benefits, such as the following:  Protecting your skin from the sun can help prevent painful sunburns, sun poisoning, and other skin damage and blemishes. This is especially important if: ? You have pale white skin, freckles, and red hair. ? You burn easily.  Avoiding exposure to harmful chemicals can help prevent damage to other tissues in your body, such as your lungs, and prevent other types of cancer.  Avoiding smoking tobacco can reduce your risk for other types of cancer and other health problems.  Eating a healthy diet is good for your overall health. What can happen if changes are not made? If you do not make these changes, you will be at higher risk for skin cancer. If you develop skin cancer, the treatments could result in lost time from work and changes in your appearance from scars. The most dangerous type of skin cancer, melanoma, can be deadly if not found early. Where to find support For more support, talk to your primary health care provider or dermatologist. Where to find more information Learn more  about skin cancer from:  [employer] [place]: www.skincancer.org/prevention  The Centers for Disease Control and Prevention: FabVets.se  The American Academy of Dermatology: http://jones-macias.info/ Summary  Skin cancer is the most common type of cancer.  Melanoma skin cancer can be deadly if not found early.  Sunburns and tanning increase your risk for skin cancer.  Protecting your skin from UV light is the best way to prevent skin cancer. This information is not intended to replace advice given to you by your health care provider. Make sure you discuss any questions you have with your health care provider. Document Released: 11/14/2017 Document Revised: 01/12/2019 Document Reviewed: 11/14/2017 Elsevier Patient Education  2020 [employer].

## 2019-06-27 ENCOUNTER — Ambulatory Visit: Payer: BC Managed Care – PPO | Admitting: Emergency Medicine

## 2019-11-30 ENCOUNTER — Encounter: Payer: Self-pay | Admitting: Registered Nurse

## 2019-11-30 ENCOUNTER — Telehealth (INDEPENDENT_AMBULATORY_CARE_PROVIDER_SITE_OTHER): Payer: BC Managed Care – PPO | Admitting: Registered Nurse

## 2019-11-30 ENCOUNTER — Encounter: Payer: Self-pay | Admitting: Emergency Medicine

## 2019-11-30 ENCOUNTER — Other Ambulatory Visit: Payer: Self-pay

## 2019-11-30 VITALS — Temp 98.7°F | Wt 152.0 lb

## 2019-11-30 DIAGNOSIS — Z7185 Encounter for immunization safety counseling: Secondary | ICD-10-CM

## 2019-11-30 DIAGNOSIS — Z7189 Other specified counseling: Secondary | ICD-10-CM | POA: Diagnosis not present

## 2019-11-30 NOTE — Progress Notes (Signed)
Patient was calling just because she wanted to ask the doctor some questions about the Covid vaccine. Per patient she has no other concerns at this time.

## 2019-11-30 NOTE — Telephone Encounter (Signed)
PATIENT CALLED BACK ,PUT HER ON R[facility] MORROW SCHEDULE FOR TODAY

## 2019-11-30 NOTE — Telephone Encounter (Signed)
CALLED PAT OFFERED HERE A VIRTUAL TODAY ALSO GAVE HER THE  # [phone] FOR COVID-19 VACCINE QUESTIONS

## 2019-12-03 ENCOUNTER — Encounter: Payer: Self-pay | Admitting: Emergency Medicine

## 2019-12-04 ENCOUNTER — Telehealth (INDEPENDENT_AMBULATORY_CARE_PROVIDER_SITE_OTHER): Payer: BC Managed Care – PPO | Admitting: Emergency Medicine

## 2019-12-04 ENCOUNTER — Other Ambulatory Visit: Payer: Self-pay

## 2019-12-04 ENCOUNTER — Encounter: Payer: Self-pay | Admitting: Emergency Medicine

## 2019-12-04 VITALS — Ht 61.0 in | Wt 152.0 lb

## 2019-12-04 DIAGNOSIS — T50Z95A Adverse effect of other vaccines and biological substances, initial encounter: Secondary | ICD-10-CM | POA: Diagnosis not present

## 2019-12-04 NOTE — Patient Instructions (Signed)
° ° ° °  If you have lab work done today you will be contacted with your lab results within the next 2 weeks.  If you have not heard from us then please contact us. The fastest way to get your results is to register for My Chart. ° ° °IF you received an x-ray today, you will receive an invoice from [facility]. Please contact [facility] at [phone] with questions or concerns regarding your invoice.  ° °IF you received labwork today, you will receive an invoice from [facility]. Please contact [facility] at [phone] with questions or concerns regarding your invoice.  ° °Our billing staff will not be able to assist you with questions regarding bills from these companies. ° °You will be contacted with the lab results as soon as they are available. The fastest way to get your results is to activate your My Chart account. Instructions are located on the last page of this paperwork. If you have not heard from us regarding the results in 2 weeks, please contact this office. °  ° ° ° °

## 2019-12-04 NOTE — Telephone Encounter (Signed)
Pt sent a message via MyChart stating had an allergic reaction to her first dose to the vaccine and wants to know the best thing to do as she now has sore throat, cough, fever and chills, vomiting and body aches. Previous allergies only include Morphine and Tessalon. Pt would also like to know if she should still get the second dose. Please advise.   I had a mild allergic reaction on Friday to my first [employer].  Lips, mouth and tongue became numb and itchy.  I had massive back and neck pain that evening and through Saturday.  Thru the night I woke up several times with a low-grade fever and knot in my throat.  On Sunday I felt like a truck had hit me and have flu like symptoms.  Today, Monday I still have flu like symptoms, cough, chills, vomiting although the pains through my back and neck are almost gone, and no fever.   Should I take the second vaccine and when should I expect these symptoms to get better?

## 2019-12-04 NOTE — Telephone Encounter (Signed)
She has an appointment today at 4 PM.  Will address her concerns then.

## 2019-12-04 NOTE — Progress Notes (Signed)
Telemedicine Encounter- SOAP NOTE Established Patient MyChart video conference attempted without success. This telephone encounter was conducted with the patient's (or proxy's) verbal consent via audio telecommunications: yes/no: Yes Patient was instructed to have this encounter in a suitably private space; and to only have persons present to whom they give permission to participate. In addition, patient identity was confirmed by use of name plus two identifiers (DOB and address).  I discussed the limitations, risks, security and privacy concerns of performing an evaluation and management service by telephone and the availability of in person appointments. I also discussed with the patient that there may be a patient responsible charge related to this service. The patient expressed understanding and agreed to proceed.  I spent a total of TIME; 0 MIN TO 60 MIN: 15 minutes talking with the patient or their proxy.  Chief Complaint  Patient presents with  . Chest Pain    with chest pressure that started last night, patient had COVID 19 vaccine 11/30/19 - itching, lips numbness  . Shortness of Breath    started last night    Subjective   Suzanne Tucker is a 39 y.o. female established patient. Telephone visit today with concern about possible allergy to Covid vaccine.  She received first shot last Friday, 4 days ago, and soon after about 5 minutes later she developed itching with lips numb, followed in the next 24 hours by flu like symptoms with generalized achiness, cough, chest pressure, some shortness of breath.  It lasted 2 to 3 days.  Better today.  No other significant symptoms.  Has a history of multiple allergies to different things.  States it feels similar to when she had an allergic reaction to tramadol.  HPI   Patient Active Problem List   Diagnosis Date Noted  . Chronic pelvic pain in female 03/15/2019  . 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 Carpal tunnel syndrome on right  11/02/2017  . MVP (mitral valve prolapse) 10/28/2017    Past Medical History:  Diagnosis Date  . Anginal pain ([place]) 02/2019   related to MVP  . Mitral prolapse     Current Outpatient Medications  Medication Sig Dispense Refill  . aspirin 81 MG chewable tablet Chew 81 mg by mouth daily.     No current facility-administered medications for this visit.   Facility-Administered Medications Ordered in Other Visits  Medication Dose Route Frequency Provider Last Rate Last Admin  . gadopentetate dimeglumine (MAGNEVIST) injection 13 mL  13 mL Intravenous Once PRN Sater, Nanine Means, MD        Allergies  Allergen Reactions  . Morphine Shortness Of Breath  . Morphine And Related Anaphylaxis  . Tessalon [Benzonatate] Hives    Hives, itching, burning all over.   . Hydrocodone Hives  . Latex Rash  . Codeine Nausea And Vomiting  . Tramadol Other (See Comments)    Tingling in the mouth with swelling     Social History   Socioeconomic History  . Marital status: Single    Spouse name: Not on file  . Number of children: Not on file  . Years of education: Not on file  . Highest education level: Not on file  Occupational History  . Not on file  Tobacco Use  . Smoking status: Never Smoker  . Smokeless tobacco: Never Used  Substance and Sexual Activity  . Alcohol use: Not Currently    Comment:    . Drug use: No  . Sexual activity: Never  Other Topics Concern  .  Not on file  Social History Narrative  . Not on file   Social Determinants of Health   Financial Resource Strain:   . Difficulty of Paying Living Expenses: Not on file  Food Insecurity:   . Worried About Programme researcher, broadcasting/film/video in the Last Year: Not on file  . Ran Out of Food in the Last Year: Not on file  Transportation Needs:   . Lack of Transportation (Medical): Not on file  . Lack of Transportation (Non-Medical): Not on file  Physical Activity:   . Days of Exercise per Week: Not on file  . Minutes of Exercise per  Session: Not on file  Stress:   . Feeling of Stress : Not on file  Social Connections:   . Frequency of Communication with Friends and Family: Not on file  . Frequency of Social Gatherings with Friends and Family: Not on file  . Attends Religious Services: Not on file  . Active Member of Clubs or Organizations: Not on file  . Attends Banker Meetings: Not on file  . Marital Status: Not on file  Intimate Partner Violence:   . Fear of Current or Ex-Partner: Not on file  . Emotionally Abused: Not on file  . Physically Abused: Not on file  . Sexually Abused: Not on file    Review of Systems  Constitutional: Positive for chills. Negative for fever.  HENT: Negative.  Negative for congestion and sore throat.   Respiratory: Positive for shortness of breath. Negative for cough.   Cardiovascular: Positive for chest pain.  Gastrointestinal: Negative for abdominal pain, diarrhea, nausea and vomiting.  Musculoskeletal: Positive for myalgias. Negative for joint pain.  Skin: Negative.  Negative for rash.  Neurological: Negative for dizziness and headaches.  All other systems reviewed and are negative.   Objective  Alert and oriented x3 in no apparent respiratory distress. Vitals as reported by the patient: Today's Vitals   12/04/19 1544  Weight: 152 lb (68.9 kg)  Height: 5\' 1"  (1.549 m)    There are no diagnoses linked to this encounter. [name] was seen today for chest pain and shortness of breath.  Diagnoses and all orders for this visit:  Adverse effect of vaccine, initial encounter  Possible Covid vaccine allergic reaction.  Advice at present time is to avoid second shot. Clinically stable at present time.   I discussed the assessment and treatment plan with the patient. The patient was provided an opportunity to ask questions and all were answered. The patient agreed with the plan and demonstrated an understanding of the instructions.   The patient was advised to  call back or seek an in-person evaluation if the symptoms worsen or if the condition fails to improve as anticipated.  I provided 15 minutes of non-face-to-face time during this encounter.  , MD  Primary Care at [facility]

## 2019-12-08 NOTE — Progress Notes (Signed)
Telemedicine Encounter- SOAP NOTE Established Patient  This telephone encounter was conducted with the patient's (or proxy's) verbal consent via audio telecommunications: yes  Patient was instructed to have this encounter in a suitably private space; and to only have persons present to whom they give permission to participate. In addition, patient identity was confirmed by use of name plus two identifiers (DOB and address).  I discussed the limitations, risks, security and privacy concerns of performing an evaluation and management service by telephone and the availability of in person appointments. I also discussed with the patient that there may be a patient responsible charge related to this service. The patient expressed understanding and agreed to proceed.  I spent a total of 13 minutes talking with the patient or their proxy.  No chief complaint on file.   Subjective   Suzanne Tucker is a 39 y.o. established patient. 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  Given her medical hx, particularly of MVP, she has a number of questions regarding the safety of the vaccine and whether or not she would be a good candidate for the vaccine.   Patient Active Problem List   Diagnosis Date Noted  . Chronic pelvic pain in female 03/15/2019  . 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 Carpal tunnel syndrome on right 11/02/2017  . MVP (mitral valve prolapse) 10/28/2017    Past Medical History:  Diagnosis Date  . Anginal pain ([place]) 02/2019   related to MVP  . Mitral prolapse     Current Outpatient Medications  Medication Sig Dispense Refill  . aspirin 81 MG chewable tablet Chew 81 mg by mouth daily.     No current facility-administered medications for this visit.   Facility-Administered Medications Ordered in Other Visits  Medication Dose Route Frequency Provider Last Rate Last Admin  .  gadopentetate dimeglumine (MAGNEVIST) injection 13 mL  13 mL Intravenous Once PRN Sater, Nanine Means, MD        Allergies  Allergen Reactions  . Morphine Shortness Of Breath  . Morphine And Related Anaphylaxis  . Tessalon [Benzonatate] Hives    Hives, itching, burning all over.   . Hydrocodone Hives  . Latex Rash  . Codeine Nausea And Vomiting  . Tramadol Other (See Comments)    Tingling in the mouth with swelling     Social History   Socioeconomic History  . Marital status: Single    Spouse name: Not on file  . Number of children: Not on file  . Years of education: Not on file  . Highest education level: Not on file  Occupational History  . Not on file  Tobacco Use  . Smoking status: Never Smoker  . Smokeless tobacco: Never Used  Substance and Sexual Activity  . Alcohol use: Not Currently    Comment:    . Drug use: No  . Sexual activity: Never  Other Topics Concern  . Not on file  Social History Narrative  . Not on file   Social Determinants of Health   Financial Resource Strain:   . Difficulty of Paying Living Expenses: Not on file  Food Insecurity:   . Worried About Charity fundraiser in the Last Year: Not on file  . Ran Out of Food in the Last Year: Not on file  Transportation Needs:   . Lack of Transportation (Medical): Not on file  . Lack of Transportation (Non-Medical):  Not on file  Physical Activity:   . Days of Exercise per Week: Not on file  . Minutes of Exercise per Session: Not on file  Stress:   . Feeling of Stress : Not on file  Social Connections:   . Frequency of Communication with Friends and Family: Not on file  . Frequency of Social Gatherings with Friends and Family: Not on file  . Attends Religious Services: Not on file  . Active Member of Clubs or Organizations: Not on file  . Attends Banker Meetings: Not on file  . Marital Status: Not on file  Intimate Partner Violence:   . Fear of Current or Ex-Partner: Not on file    . Emotionally Abused: Not on file  . Physically Abused: Not on file  . Sexually Abused: Not on file    Review of Systems  Constitutional: Negative.   HENT: Negative.   Eyes: Negative.   Respiratory: Negative.   Cardiovascular: Negative.   Gastrointestinal: Negative.   Genitourinary: Negative.   Musculoskeletal: Negative.   Skin: Negative.   Neurological: Negative.   Endo/Heme/Allergies: Negative.   Psychiatric/Behavioral: Negative.   All other systems reviewed and are negative.   Objective   Vitals as reported by the patient: Today's Vitals   11/30/19 1435  Temp: 98.7 F (37.1 C)  TempSrc: Temporal  Weight: 152 lb (68.9 kg)    Diagnoses and all orders for this visit:  Vaccine counseling   PLAN  Discussed with patient that she is absolutely a good candidate for vaccination - she should get the vaccine as soon as she's able.  Discussed how the vaccine works  Pt questions answered and concerns addressed  Patient encouraged to call clinic with any questions, comments, or concerns.  I discussed the assessment and treatment plan with the patient. The patient was provided an opportunity to ask questions and all were answered. The patient agreed with the plan and demonstrated an understanding of the instructions.   The patient was advised to call back or seek an in-person evaluation if the symptoms worsen or if the condition fails to improve as anticipated.  I provided 13 minutes of non-face-to-face time during this encounter.  Janeece Agee, NP  Primary Care at [facility]

## 2020-05-29 NOTE — Telephone Encounter (Signed)
Called and spoke to patient. She will come in to be seen tomorrow.

## 2020-05-29 NOTE — Progress Notes (Signed)
Cardiology Office Note   Date:  05/30/2020   ID:  Chanika, Byland January 10, 1981, MRN [id number]  PCP:  Georgina Quint, MD    No chief complaint on file.  MVP  Wt Readings from Last 3 Encounters:  05/30/20 148 lb 6.4 oz (67.3 kg)  12/04/19 152 lb (68.9 kg)  11/30/19 152 lb (68.9 kg)       History of Present Illness: Suzanne Tucker is a 39 y.o. female  who is being seen today for the evaluation of mitral valve prolapseat the request of Georgina Quint.  She is a Education officer, museum.  She carries this diagnosis for the past 5-6 years. She was living in [place], [place]., Dr. Melanie Crazier. Was not using antibiotics for dental appts.  In her family, her maternal grandmother had pacemaker and valve replacement. Maternal GF with MI. Cousin had a valve repair.  2020 echo showed normal LV/RV function.  Normal valvular function as well.   She has had palpitations, but has not worn a monitor. No other heart tests done.  Sx worse with EtOH and caffeine in the past.   She had GYN surgery in June 2020 successfully.  No heart problems at that time.   Vegan diet.    She got the first dose of Pfizer vaccine in Feb 2021.  She had tongue swelling and so has not had the second shot.  She has been healthy otherwise.  She has had more palpitations in the past week.  She did have some diet coke, but more than normal.  Episodes lasted about 5 minutes.  No syncope.   Did not have to use Valvsalva.  No palpitations in the last 12 hours.    Denies : Chest pain. Dizziness. Leg edema. Nitroglycerin use. Orthopnea.  Paroxysmal nocturnal dyspnea. Shortness of breath. Syncope.     Past Medical History:  Diagnosis Date  . Anginal pain (HCC) 02/2019   related to MVP  . Mitral prolapse     Past Surgical History:  Procedure Laterality Date  . CESAREAN SECTION  03/30/2008  . ROBOTIC ASSISTED TOTAL HYSTERECTOMY Bilateral 03/15/2019   Procedure: XI ROBOTIC ASSISTED TOTAL  HYSTERECTOMY with Bilateral Salpingectomy;  Surgeon: Silverio Lay, MD;  Location: [facility];  Service: Gynecology;  Laterality: Bilateral;  . TUBAL LIGATION  2017     Current Outpatient Medications  Medication Sig Dispense Refill  . aspirin 81 MG chewable tablet Chew 81 mg by mouth daily.     No current facility-administered medications for this visit.   Facility-Administered Medications Ordered in Other Visits  Medication Dose Route Frequency Provider Last Rate Last Admin  . gadopentetate dimeglumine (MAGNEVIST) injection 13 mL  13 mL Intravenous Once PRN Sater, Pearletha Furl, MD        Allergies:   Morphine, Morphine and related, Tessalon [benzonatate], Hydrocodone, Latex, Codeine, and Tramadol    Social History:  The patient  reports that she has never smoked. She has never used smokeless tobacco. She reports previous alcohol use. She reports that she does not use drugs.   Family History:  The patient's family history includes Alcohol abuse in her paternal grandfather; Alzheimer's disease in her paternal grandfather and paternal grandmother; Dementia in her paternal grandfather and paternal grandmother; Diabetes in her maternal grandfather and maternal grandmother; Heart attack in her maternal grandmother; Heart disease in her maternal grandfather and maternal grandmother.    ROS:  Please see the history of present illness.   Otherwise, review of systems  are positive for palpitations.   All other systems are reviewed and negative.    PHYSICAL EXAM: VS:  BP 106/72   Pulse 83   Ht 5\' 1"  (1.549 m)   Wt 148 lb 6.4 oz (67.3 kg)   SpO2 99%   BMI 28.04 kg/m  , BMI Body mass index is 28.04 kg/m. GEN: Well nourished, well developed, in no acute distress  HEENT: normal  Neck: no JVD, carotid bruits, or masses Cardiac: RRR; no murmurs, rubs, or gallops,no edema  Respiratory:  clear to auscultation bilaterally, normal work of breathing GI: soft, nontender, nondistended,  + BS MS: no deformity or atrophy  Skin: warm and dry, no rash Neuro:  Strength and sensation are intact Psych: euthymic mood, full affect   EKG:   The ekg ordered today demonstrates NSR, inferolateral ST depressions- unchanged   Recent Labs: No results found for requested labs within last 8760 hours.   Lipid Panel No results found for: CHOL, TRIG, HDL, CHOLHDL, VLDL, LDLCALC, LDLDIRECT   Other studies Reviewed: Additional studies/ records that were reviewed today with results demonstrating: prior echo reviewed. No recent labs available.   ASSESSMENT AND PLAN:  1. MVP: trivial MR. Echo showed normal LV function in the past. 2. Palpitations: Symptoms have improved in the last day.  If symptoms return, we will plan for a 14-day Zio patch.  We can mail this to her.  Since symptoms seem to be getting less frequent, we did not pursue the monitor today.  No syncope.  The palpitations do not seem to be coming from a high risk arrhythmia. 3. We spoke about options for second Covid vaccine.  She will have to see about premedicating and getting a second physeal dose or potentially trying a different brand if that is approved.  Will hav eto follow CDC recs as they come out. 4. She should have a screening lipid test as well if not already done.   Current medicines are reviewed at length with the patient today.  The patient concerns regarding her medicines were addressed.  The following changes have been made:  No change  Labs/ tests ordered today include:   Orders Placed This Encounter  Procedures  . EKG 12-Lead    Recommend 150 minutes/week of aerobic exercise Low fat, low carb, high fiber diet recommended  Disposition:   FU in 1 year   Signed, , MD  05/30/2020 3:42 PM    [facility] [address] [place], [place], [place]  [place] Phone: [phone]; Fax: [phone]

## 2020-05-30 ENCOUNTER — Other Ambulatory Visit: Payer: Self-pay

## 2020-05-30 ENCOUNTER — Encounter: Payer: Self-pay | Admitting: Interventional Cardiology

## 2020-05-30 ENCOUNTER — Ambulatory Visit: Payer: BC Managed Care – PPO | Admitting: Interventional Cardiology

## 2020-05-30 VITALS — BP 106/72 | HR 83 | Ht 61.0 in | Wt 148.4 lb

## 2020-05-30 DIAGNOSIS — I341 Nonrheumatic mitral (valve) prolapse: Secondary | ICD-10-CM

## 2020-05-30 DIAGNOSIS — R002 Palpitations: Secondary | ICD-10-CM

## 2020-05-30 DIAGNOSIS — Z289 Immunization not carried out for unspecified reason: Secondary | ICD-10-CM

## 2020-05-30 NOTE — Patient Instructions (Signed)

## 2020-07-04 ENCOUNTER — Encounter: Payer: Self-pay | Admitting: Emergency Medicine

## 2020-07-07 ENCOUNTER — Telehealth: Payer: BC Managed Care – PPO | Admitting: Emergency Medicine

## 2020-07-07 ENCOUNTER — Other Ambulatory Visit: Payer: Self-pay

## 2020-07-22 ENCOUNTER — Other Ambulatory Visit: Payer: Self-pay | Admitting: Family Medicine

## 2020-07-22 ENCOUNTER — Other Ambulatory Visit: Payer: Self-pay

## 2020-07-22 ENCOUNTER — Ambulatory Visit: Payer: Self-pay

## 2020-07-22 DIAGNOSIS — M25532 Pain in left wrist: Secondary | ICD-10-CM

## 2020-08-05 ENCOUNTER — Encounter: Payer: Self-pay | Admitting: Emergency Medicine

## 2020-09-20 ENCOUNTER — Other Ambulatory Visit: Payer: Self-pay

## 2020-09-20 ENCOUNTER — Emergency Department
Admission: RE | Admit: 2020-09-20 | Discharge: 2020-09-20 | Disposition: A | Discharge status: Home / Self Care | Payer: BC Managed Care – PPO | Source: Ambulatory Visit

## 2020-09-20 VITALS — BP 132/89 | HR 108 | Temp 99.9°F | Resp 20

## 2020-09-20 DIAGNOSIS — H6503 Acute serous otitis media, bilateral: Secondary | ICD-10-CM

## 2020-09-20 DIAGNOSIS — J069 Acute upper respiratory infection, unspecified: Secondary | ICD-10-CM

## 2020-09-20 MED ORDER — AZITHROMYCIN 250 MG PO TABS: ORAL_TABLET | ORAL | 0 refills | Status: DC

## 2020-09-20 NOTE — ED Provider Notes (Signed)
[facility]    CSN: [id number] Arrival date & time: 09/20/20  1207      History   Chief Complaint Chief Complaint  Patient presents with  . Fever  . Cough    HPI Suzanne Tucker is a 39 y.o. female.   39 year old woman making her initial visit to [facility] urgent care.  She is complaining about cough, sore throat, and fever.  Her symptoms began on Wednesday.  On Thursday she felt better but yesterday she started running a fever.  She has a some clear fluid with the cough.  Patient received her first dose of Pfizer vaccine but had a severe reaction with tongue swelling and has not had a second dose.  Patient is a Chartered loss adjuster.  She has not yet had her flu vaccine.     Past Medical History:  Diagnosis Date  . Anginal pain (HCC) 02/2019   related to MVP  . Mitral prolapse     Patient Active Problem List   Diagnosis Date Noted  . Chronic pelvic pain in female 03/15/2019  . 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 Carpal tunnel syndrome on right 11/02/2017  . MVP (mitral valve prolapse) 10/28/2017    Past Surgical History:  Procedure Laterality Date  . CESAREAN SECTION  03/30/2008  . ROBOTIC ASSISTED TOTAL HYSTERECTOMY Bilateral 03/15/2019   Procedure: XI ROBOTIC ASSISTED TOTAL HYSTERECTOMY with Bilateral Salpingectomy;  Surgeon: Silverio Lay, MD;  Location: [facility];  Service: Gynecology;  Laterality: Bilateral;  . TUBAL LIGATION  2017    OB History   No obstetric history on file.      Home Medications    Prior to Admission medications   Medication Sig Start Date End Date Taking? Authorizing Provider  aspirin 81 MG chewable tablet Chew 81 mg by mouth daily.    [provider]  Phenyleph-Doxylamine-DM-APAP (TYLENOL COLD/FLU/COUGH NIGHT PO) Take 1 tablet by mouth every 6 (six) hours as needed.    [provider]    Family History Family History  Problem Relation Age of Onset  . Diabetes Maternal Grandmother    . Heart disease Maternal Grandmother   . Heart attack Maternal Grandmother   . Diabetes Maternal Grandfather   . Heart disease Maternal Grandfather   . Alzheimer's disease Paternal Grandmother   . Dementia Paternal Grandmother   . Alzheimer's disease Paternal Grandfather   . Dementia Paternal Grandfather   . Alcohol abuse Paternal Grandfather     Social History Social History   Tobacco Use  . Smoking status: Never Smoker  . Smokeless tobacco: Never Used  Vaping Use  . Vaping Use: Never used  Substance Use Topics  . Alcohol use: Not Currently    Comment:    . Drug use: No     Allergies   Morphine, Morphine and related, Tessalon [benzonatate], Hydrocodone, Latex, Codeine, and Tramadol   Review of Systems Review of Systems  Constitutional: Positive for fever.  HENT: Positive for ear pain.   Respiratory: Positive for cough.      Physical Exam Triage Vital Signs ED Triage Vitals  Enc Vitals Group     BP      Pulse      Resp      Temp      Temp src      SpO2      Weight      Height      Head Circumference      Peak Flow      Pain  Score      Pain Loc      Pain Edu?      Excl. in GC?    No data found.  Updated Vital Signs There were no vitals taken for this visit.   Physical Exam Vitals and nursing note reviewed.  Constitutional:      General: She is not in acute distress.    Appearance: Normal appearance. She is normal weight. She is not ill-appearing.  HENT:     Head: Normocephalic.     Ears:     Comments: Both TMs are opaque with mild retraction and white discoloration    Mouth/Throat:     Mouth: Mucous membranes are moist.     Pharynx: No posterior oropharyngeal erythema.  Eyes:     Conjunctiva/sclera: Conjunctivae normal.  Cardiovascular:     Rate and Rhythm: Normal rate.  Pulmonary:     Effort: Pulmonary effort is normal.     Breath sounds: Normal breath sounds.  Musculoskeletal:        General: Normal range of motion.     Cervical  back: Normal range of motion and neck supple.  Skin:    General: Skin is warm and dry.  Neurological:     General: No focal deficit present.     Mental Status: She is alert and oriented to person, place, and time.  Psychiatric:        Mood and Affect: Mood normal.      UC Treatments / Results  Labs (all labs ordered are listed, but only abnormal results are displayed) Labs Reviewed - No data to display  EKG   Radiology No results found.  Procedures Procedures (including critical care time)  Medications Ordered in UC Medications - No data to display  Initial Impression / Assessment and Plan / UC Course  I have reviewed the triage vital signs and the nursing notes.  Pertinent labs & imaging results that were available during my care of the patient were reviewed by me and considered in my medical decision making (see chart for details).    Final Clinical Impressions(s) / UC Diagnoses   Final diagnoses:  None   Discharge Instructions   None    ED Prescriptions    None     I have reviewed the PDMP during this encounter.   Elvina Sidle, MD 09/20/20 1251

## 2020-09-20 NOTE — ED Triage Notes (Signed)
Pt presents to Urgent Care with c/o cough x 3 days and fever since last night. Pt w/ no  known COVID exposure; has had one dose of COVID vaccine (did not get second d/t allergic reaction) and has not had flu vaccine.

## 2020-09-22 ENCOUNTER — Telehealth: Payer: Self-pay | Admitting: Emergency Medicine

## 2020-09-22 NOTE — Telephone Encounter (Signed)
Pt called to see if her covid & flu results were back - in process- pt has access to My Chart & [facility]. Pt continues to have a fever 100.5 - 101. RN directed Kennethia to alternate tylenol & Ibuprofen for the fever. Pt also states she lost her sense of taste & smell yesterday, continuing to quarantine. Pt is a Runner, broadcasting/film/video

## 2020-09-25 ENCOUNTER — Telehealth: Payer: Self-pay

## 2020-09-25 LAB — COVID-19, FLU A+B AND RSV
Influenza A, NAA: NOT DETECTED
Influenza B, NAA: NOT DETECTED
RSV, NAA: NOT DETECTED
SARS-CoV-2, NAA: DETECTED — AB

## 2020-09-25 NOTE — Telephone Encounter (Signed)
Pt called requesting test results. Informed test still pending. Advised to check mychart frequently as they will upload as soon as its resulted.

## 2020-09-28 ENCOUNTER — Encounter: Payer: Self-pay | Admitting: Emergency Medicine

## 2020-09-29 ENCOUNTER — Encounter: Payer: Self-pay | Admitting: Emergency Medicine

## 2020-09-29 ENCOUNTER — Other Ambulatory Visit: Payer: Self-pay

## 2020-09-29 ENCOUNTER — Telehealth (INDEPENDENT_AMBULATORY_CARE_PROVIDER_SITE_OTHER): Payer: BC Managed Care – PPO | Admitting: Emergency Medicine

## 2020-09-29 VITALS — Ht 61.0 in | Wt 147.0 lb

## 2020-09-29 DIAGNOSIS — U071 COVID-19: Secondary | ICD-10-CM | POA: Diagnosis not present

## 2020-09-29 DIAGNOSIS — R6889 Other general symptoms and signs: Secondary | ICD-10-CM | POA: Diagnosis not present

## 2020-09-29 NOTE — Progress Notes (Signed)
Telemedicine Encounter- SOAP NOTE Established Patient Patient: Home  Provider: Office     This telephone encounter was conducted with the patient's (or proxy's) verbal consent via audio telecommunications: yes/no: Yes Patient was instructed to have this encounter in a suitably private space; and to only have persons present to whom they give permission to participate. In addition, patient identity was confirmed by use of name plus two identifiers (DOB and address).  I discussed the limitations, risks, security and privacy concerns of performing an evaluation and management service by telephone and the availability of in person appointments. I also discussed with the patient that there may be a patient responsible charge related to this service. The patient expressed understanding and agreed to proceed.  I spent a total of TIME; 0 MIN TO 60 MIN: 20 minutes talking with the patient or their proxy.  Chief Complaint  Patient presents with  . Nasal Congestion    Per patient ears are popping, feels swollen behind ears, yesterday out of breath and today better. Also, had loss of smell/taste. Patient is taking Zicam and Emergen-C  . Fever    Per patient had low grade fever now temp is 98.7 today, had first Pfizer vaccine unable to get second because she had allergic reaction to first.    Subjective   Suzanne Tucker is a 39 y.o. female established patient. Telephone visit today complaining of flulike symptoms that started 13 days ago.  Tested positive for Covid.  Daughter also sick with Covid.  Slowly getting better. Denies difficulty breathing or chest pain.  HPI   Patient Active Problem List   Diagnosis Date Noted  . Chronic pelvic pain in female 03/15/2019  . 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 Carpal tunnel syndrome on right 11/02/2017  . MVP (mitral valve prolapse) 10/28/2017    Past Medical History:  Diagnosis Date  . Anginal pain (HCC) 02/2019   related to MVP  . Mitral  prolapse     Current Outpatient Medications  Medication Sig Dispense Refill  . aspirin 81 MG chewable tablet Chew 81 mg by mouth daily.    Marland Kitchen OVER THE COUNTER MEDICATION 2 (two) times daily.    Marland Kitchen OVER THE COUNTER MEDICATION daily.    Marland Kitchen azithromycin (ZITHROMAX) 250 MG tablet Take 2 tabs PO x 1 dose, then 1 tab PO QD x 4 days (Patient not taking: Reported on 09/29/2020) 6 tablet 0  . Phenyleph-Doxylamine-DM-APAP (TYLENOL COLD/FLU/COUGH NIGHT PO) Take 1 tablet by mouth every 6 (six) hours as needed. (Patient not taking: Reported on 09/29/2020)     No current facility-administered medications for this visit.   Facility-Administered Medications Ordered in Other Visits  Medication Dose Route Frequency Provider Last Rate Last Admin  . gadopentetate dimeglumine (MAGNEVIST) injection 13 mL  13 mL Intravenous Once PRN Sater, Pearletha Furl, MD        Allergies  Allergen Reactions  . Morphine Shortness Of Breath  . Morphine And Related Anaphylaxis  . Tessalon [Benzonatate] Hives    Hives, itching, burning all over.   . Hydrocodone Hives  . Latex Rash  . Codeine Nausea And Vomiting  . Tramadol Other (See Comments)    Tingling in the mouth with swelling     Social History   Socioeconomic History  . Marital status: Single    Spouse name: Not on file  . Number of children: Not on file  . Years of education: Not on file  . Highest education level: Not on file  Occupational  History  . Not on file  Tobacco Use  . Smoking status: Never Smoker  . Smokeless tobacco: Never Used  Vaping Use  . Vaping Use: Never used  Substance and Sexual Activity  . Alcohol use: Not Currently    Comment:    . Drug use: No  . Sexual activity: Never  Other Topics Concern  . Not on file  Social History Narrative  . Not on file   Social Determinants of Health   Financial Resource Strain: Not on file  Food Insecurity: Not on file  Transportation Needs: Not on file  Physical Activity: Not on file  Stress:  Not on file  Social Connections: Not on file  Intimate Partner Violence: Not on file    Review of Systems  Constitutional: Positive for fever and malaise/fatigue. Negative for chills.  HENT: Positive for congestion. Negative for sore throat.   Respiratory: Positive for cough. Negative for shortness of breath.   Cardiovascular: Positive for palpitations. Negative for chest pain.  Gastrointestinal: Negative for abdominal pain, diarrhea, nausea and vomiting.  Genitourinary: Negative.  Negative for dysuria and hematuria.  Skin: Negative.  Negative for rash.  Neurological: Negative.  Negative for dizziness and headaches.  All other systems reviewed and are negative.   Objective  Alert and oriented x3 in no apparent respiratory distress. Vitals as reported by the patient: Today's Vitals   09/29/20 1615  Weight: 147 lb (66.7 kg)  Height: 5\' 1"  (1.549 m)    There are no diagnoses linked to this encounter.  [name] was seen today for nasal congestion and fever.  Diagnoses and all orders for this visit:  COVID-19 virus infection  Flu-like symptoms    Clinically stable.  No red flag signs or symptoms.  Covid instructions given.  ED precautions given. Advised to stay home in isolation until much improved and afebrile. Advised to call the office if no better or worse during the next several days.  I discussed the assessment and treatment plan with the patient. The patient was provided an opportunity to ask questions and all were answered. The patient agreed with the plan and demonstrated an understanding of the instructions.   The patient was advised to call back or seek an in-person evaluation if the symptoms worsen or if the condition fails to improve as anticipated.  I provided 20 minutes of non-face-to-face time during this encounter.  , MD  [facility] at [facility]

## 2020-09-29 NOTE — Patient Instructions (Signed)
° ° ° °  If you have lab work done today you will be contacted with your lab results within the next 2 weeks.  If you have not heard from us then please contact us. The fastest way to get your results is to register for My Chart. ° ° °IF you received an x-ray today, you will receive an invoice from [facility]. Please contact [facility] at [phone] with questions or concerns regarding your invoice.  ° °IF you received labwork today, you will receive an invoice from [facility]. Please contact [facility] at [phone] with questions or concerns regarding your invoice.  ° °Our billing staff will not be able to assist you with questions regarding bills from these companies. ° °You will be contacted with the lab results as soon as they are available. The fastest way to get your results is to activate your My Chart account. Instructions are located on the last page of this paperwork. If you have not heard from us regarding the results in 2 weeks, please contact this office. °  ° ° ° °

## 2020-10-01 ENCOUNTER — Telehealth: Payer: Self-pay | Admitting: Interventional Cardiology

## 2020-10-01 NOTE — Telephone Encounter (Signed)
Spoke with pt who reports she is recovering from Covid.  Diagnosed 09/17/2020.  Pt developed SOB, chest tightness and palpitations yesterday.  Pt reports she has a history of mitral valve prolapse so she is used to having these symptoms but they have been more aggressive than in the past since Covid.  Pt is able to speak in full sentences without sounding SOB and denies active CP. Pt reports she only takes and ASA 81mg  daily.  Appointment scheduled with Dr for 10/06/2020 at 340pm.  Reviewed ED precautions.  Pt verbalizes understanding and agrees with current plan.

## 2020-10-01 NOTE — Telephone Encounter (Signed)
Pt c/o Shortness Of Breath: STAT if SOB developed within the last 24 hours or pt is noticeably SOB on the phone  1. Are you currently SOB (can you hear that pt is SOB on the phone)? No   2. How long have you been experiencing SOB? Patient states SOB developed on 09/30/20  3. Are you SOB when sitting or when up moving around? Both   4. Are you currently experiencing any other symptoms? Palpitations, chest tightness (denies chest pain)   Patient c/o Palpitations:  High priority if patient c/o lightheadedness, shortness of breath, or chest pain  1) How long have you had palpitations/irregular HR/ Afib? Are you having the symptoms now? Patient states the palpitations began yesterday and she is still having them now.  2) Are you currently experiencing lightheadedness, SOB or CP? No   3) Do you have a history of afib (atrial fibrillation) or irregular heart rhythm? No   4) Have you checked your BP or HR? (document readings if available):  BP: 114/70 (estimate) HR: 63  5) Are you experiencing any other symptoms? Chest tightness (denies chest pain)

## 2020-10-06 ENCOUNTER — Encounter: Payer: Self-pay | Admitting: Interventional Cardiology

## 2020-10-06 ENCOUNTER — Encounter: Payer: Self-pay | Admitting: Emergency Medicine

## 2020-10-06 ENCOUNTER — Ambulatory Visit (INDEPENDENT_AMBULATORY_CARE_PROVIDER_SITE_OTHER): Payer: BC Managed Care – PPO | Admitting: Interventional Cardiology

## 2020-10-06 ENCOUNTER — Other Ambulatory Visit: Payer: Self-pay

## 2020-10-06 VITALS — BP 112/78 | HR 84 | Ht 61.0 in | Wt 154.0 lb

## 2020-10-06 DIAGNOSIS — R0602 Shortness of breath: Secondary | ICD-10-CM

## 2020-10-06 DIAGNOSIS — R0789 Other chest pain: Secondary | ICD-10-CM

## 2020-10-06 DIAGNOSIS — I341 Nonrheumatic mitral (valve) prolapse: Secondary | ICD-10-CM

## 2020-10-06 DIAGNOSIS — R002 Palpitations: Secondary | ICD-10-CM

## 2020-10-06 NOTE — Patient Instructions (Signed)
Medication Instructions:  Your physician recommends that you continue on your current medications as directed. Please refer to the Current Medication list given to you today.  *If you need a refill on your cardiac medications before your next appointment, please call your pharmacy*  Lab Work: If you have labs (blood work) drawn today and your tests are completely normal, you will receive your results only by: Marland Kitchen MyChart Message (if you have MyChart) OR . A paper copy in the mail If you have any lab test that is abnormal or we need to change your treatment, we will call you to review the results.  Testing/Procedures: Your physician has requested that you have an echocardiogram. Echocardiography is a painless test that uses sound waves to create images of your heart. It provides your doctor with information about the size and shape of your heart and how well your heart's chambers and valves are working. This procedure takes approximately one hour. There are no restrictions for this procedure.  ZIO XT- Long Term Monitor Instructions   Your physician has requested you wear your ZIO patch monitor___14____days.   This is a single patch monitor.  [employer] supplies one patch monitor per enrollment.  Additional stickers are not available.   Please do not apply patch if you will be having a Nuclear Stress Test, Echocardiogram, Cardiac CT, MRI, or Chest Xray during the time frame you would be wearing the monitor. The patch cannot be worn during these tests.  You cannot remove and re-apply the ZIO XT patch monitor.   Your ZIO patch monitor will be sent USPS Priority mail from [facility] directly to your home address. The monitor may also be mailed to a PO BOX if home delivery is not available.   It may take 3-5 days to receive your monitor after you have been enrolled.   Once you have received you monitor, please review enclosed instructions.  Your monitor has already been registered assigning a  specific monitor serial # to you.   Applying the monitor   Shave hair from upper left chest.   Hold abrader disc by orange tab.  Rub abrader in 40 strokes over left upper chest as indicated in your monitor instructions.   Clean area with 4 enclosed alcohol pads .  Use all pads to assure are is cleaned thoroughly.  Let dry.   Apply patch as indicated in monitor instructions.  Patch will be place under collarbone on left side of chest with arrow pointing upward.   Rub patch adhesive wings for 2 minutes.Remove white label marked "1".  Remove white label marked "2".  Rub patch adhesive wings for 2 additional minutes.   While looking in a mirror, press and release button in center of patch.  A small green light will flash 3-4 times .  This will be your only indicator the monitor has been turned on.     Do not shower for the first 24 hours.  You may shower after the first 24 hours.   Press button if you feel a symptom. You will hear a small click.  Record Date, Time and Symptom in the Patient Log Book.   When you are ready to remove patch, follow instructions on last 2 pages of Patient Log Book.  Stick patch monitor onto last page of Patient Log Book.   Place Patient Log Book in [place] box.  Use locking tab on box and tape box closed securely.  The [place] and [employer] has [employer]  on it.  Please place in mailbox as soon as possible.  Your physician should have your test results approximately 7 days after the monitor has been mailed back to [facility].   Call [facility] Customer Care at [phone] if you have questions regarding your ZIO XT patch monitor.  Call them immediately if you see an orange light blinking on your monitor.   If your monitor falls off in less than 4 days contact our Monitor department at [phone].  If your monitor becomes loose or falls off after 4 days call [employer] at [phone] for suggestions on securing your monitor.    Follow-Up: At [facility], you and your health needs are our priority.  As part of our continuing mission to provide you with exceptional heart care, we have created designated Provider Care Teams.  These Care Teams include your primary Cardiologist (physician) and Advanced Practice Providers (APPs -  Physician Assistants and Nurse Practitioners) who all work together to provide you with the care you need, when you need it.  We recommend signing up for the patient portal called "MyChart".  Sign up information is provided on this After Visit Summary.  MyChart is used to connect with patients for Virtual Visits (Telemedicine).  Patients are able to view lab/test results, encounter notes, upcoming appointments, etc.  Non-urgent messages can be sent to your provider as well.   To learn more about what you can do with MyChart, go to [email or web address].    Your next appointment:   Pending on results of echocardiogram and monitor.  The format for your next appointment:   In Person  Provider:   You may see Lance Muss, MD or one of the following Advanced Practice Providers on your designated Care Team:    Ronie Spies, PA-C  Jacolyn Reedy, PA-C

## 2020-10-06 NOTE — Progress Notes (Addendum)
Cardiology Office Note   Date:  10/06/2020   ID:  Suzanne, Tucker 02-06-1981, MRN [id number]  PCP:  Suzanne Quint, MD    No chief complaint on file.  MVP  Wt Readings from Last 3 Encounters:  10/06/20 154 lb (69.9 kg)  09/29/20 147 lb (66.7 kg)  05/30/20 148 lb 6.4 oz (67.3 kg)       History of Present Illness: Suzanne Tucker is a 40 y.o. female  With a h/o mitral valve prolapse.  She is a Education officer, museum.  She carries this diagnosis for the past 5-6 years. She was living in [place], [place]., Dr. Melanie Tucker. Was not using antibiotics for dental appts.  In her family, her maternal grandmother had pacemaker and valve replacement. Maternal GF with MI. Cousin had a valve repair.  2020 echo showed normal LV/RV function.  Normal valvular function as well.   She has had palpitations, but has not worn a monitor. No other heart tests done.  Sx worse with EtOH and caffeine in the past.   She had GYN surgery in June 2020 successfully. No heart problems at that time.   Vegan diet.    She got the first dose of Pfizer vaccine in Feb 2021.  She had tongue swelling and so has not had the second shot.  She has been healthy otherwise.  Mild palpitations in [address] with caffeine. Shortlived.   Normal LVEF in 03/2019.   Diagnosed with COVID in 12/21.  Had prolonged fevers which have resolved.  After 2 weeks, she has had more palpitations.  No further cough.  Still has some headaches.     Past Medical History:  Diagnosis Date  . Anginal pain (HCC) 02/2019   related to MVP  . Mitral prolapse     Past Surgical History:  Procedure Laterality Date  . CESAREAN SECTION  03/30/2008  . ROBOTIC ASSISTED TOTAL HYSTERECTOMY Bilateral 03/15/2019   Procedure: XI ROBOTIC ASSISTED TOTAL HYSTERECTOMY with Bilateral Salpingectomy;  Surgeon: Suzanne Lay, MD;  Location: [facility];  Service: Gynecology;  Laterality: Bilateral;  . TUBAL LIGATION  2017      Current Outpatient Medications  Medication Sig Dispense Refill  . aspirin 81 MG chewable tablet Chew 81 mg by mouth daily.     No current facility-administered medications for this visit.   Facility-Administered Medications Ordered in Other Visits  Medication Dose Route Frequency Provider Last Rate Last Admin  . gadopentetate dimeglumine (MAGNEVIST) injection 13 mL  13 mL Intravenous Once PRN Tucker, Suzanne Furl, MD        Allergies:   Morphine, Morphine and related, Tessalon [benzonatate], Hydrocodone, Latex, Codeine, and Tramadol    Social History:  The patient  reports that she has never smoked. She has never used smokeless tobacco. She reports previous alcohol use. She reports that she does not use drugs.   Family History:  The patient's family history includes Alcohol abuse in her paternal grandfather; Alzheimer's disease in her father, paternal grandfather, and paternal grandmother; Dementia in her paternal grandfather and paternal grandmother; Diabetes in her maternal grandfather and maternal grandmother; Healthy in her mother; Heart attack in her maternal grandmother; Heart disease in her maternal grandfather and maternal grandmother.    ROS:  Please see the history of present illness.   Otherwise, review of systems are positive for palpitations, chest pressure.   All other systems are reviewed and negative.    PHYSICAL EXAM: VS:  BP 112/78   Pulse 84  Ht 5\' 1"  (1.549 m)   Wt 154 lb (69.9 kg)   SpO2 97%   BMI 29.10 kg/m  , BMI Body mass index is 29.1 kg/m. GEN: Well nourished, well developed, in no acute distress  HEENT: normal  Neck: no JVD, carotid bruits, or masses Cardiac: RRR; early 1/6 systolic murmur, no rubs, or gallops,no edema  Respiratory:  clear to auscultation bilaterally, normal work of breathing GI: soft, nontender, nondistended, + BS MS: no deformity or atrophy  Skin: warm and dry, no rash Neuro:  Strength and sensation are intact Psych: euthymic  mood, full affect   EKG:   The ekg ordered today demonstrates NSR, inferolateral ST changes- unchanged from prior   Recent Labs: No results found for requested labs within last 8760 hours.   Lipid Panel No results found for: CHOL, TRIG, HDL, CHOLHDL, VLDL, LDLCALC, LDLDIRECT   Other studies Reviewed: Additional studies/ records that were reviewed today with results demonstrating: prior ECG reviewed.   ASSESSMENT AND PLAN:  1. MVP/SHOB: Plan or echo.  She is in agreement and would like some reassurance.  We have seen several cases of COVID related pericarditis.  ECG does not show this.  She appears euvolemic.  I suspect LVEF will be ok.   2. Palpitations/constant chest pressure- not related to exertion: Palpitations Worse since COVID.  Plan for 2 week Zio patch.    Current medicines are reviewed at length with the patient today.  The patient concerns regarding her medicines were addressed.  The following changes have been made:  No change  Labs/ tests ordered today include: Zio patch, echo No orders of the defined types were placed in this encounter.   Recommend 150 minutes/week of aerobic exercise Low fat, low carb, high fiber diet recommended  Disposition:   FU in based on results   Signed, , MD  10/06/2020 4:02 PM    [facility] [address] [place], [place], [place]  [place] Phone: [phone]; Fax: [phone]

## 2020-10-07 ENCOUNTER — Ambulatory Visit (INDEPENDENT_AMBULATORY_CARE_PROVIDER_SITE_OTHER): Payer: BC Managed Care – PPO

## 2020-10-07 ENCOUNTER — Encounter: Payer: Self-pay | Admitting: Radiology

## 2020-10-07 ENCOUNTER — Other Ambulatory Visit: Payer: Self-pay

## 2020-10-07 ENCOUNTER — Emergency Department (INDEPENDENT_AMBULATORY_CARE_PROVIDER_SITE_OTHER)
Admission: RE | Admit: 2020-10-07 | Discharge: 2020-10-07 | Disposition: A | Discharge status: Home / Self Care | Payer: BC Managed Care – PPO | Source: Ambulatory Visit

## 2020-10-07 VITALS — BP 143/84 | HR 85 | Temp 98.9°F | Resp 17 | Ht 61.0 in | Wt 150.0 lb

## 2020-10-07 DIAGNOSIS — R509 Fever, unspecified: Secondary | ICD-10-CM

## 2020-10-07 DIAGNOSIS — U071 COVID-19: Secondary | ICD-10-CM

## 2020-10-07 DIAGNOSIS — R002 Palpitations: Secondary | ICD-10-CM

## 2020-10-07 DIAGNOSIS — I341 Nonrheumatic mitral (valve) prolapse: Secondary | ICD-10-CM

## 2020-10-07 NOTE — Discharge Instructions (Addendum)
If additional time is needed, please follow-up with Dr. Leonette Nutting for additional time off.

## 2020-10-07 NOTE — Progress Notes (Signed)
Enrolled patient for a 14 day Zio Xt Monitor to be mailed to patients home.

## 2020-10-07 NOTE — ED Triage Notes (Signed)
Pt tested positive for COVID on 09/25/20 (symptoms started on 09/17/20) tmax was 100.2 this am  OTC meds - tylenol & ibuprofen Pt asked why she didn't have a CXR  Pt saw her cardiologist - holter monitor en route via mail & echo ordered for 10/23/19 Pt has hx of MVP

## 2020-10-08 NOTE — Telephone Encounter (Signed)
Covid viral infection.  Unfortunately has to run its course and may take weeks to months.  Thanks.

## 2020-10-08 NOTE — Telephone Encounter (Signed)
See office visit from 10/06/20 .Zack Seal

## 2020-10-08 NOTE — ED Provider Notes (Signed)
[facility]    CSN: [id number] Arrival date & time: 10/07/20  1740      History   Chief Complaint Chief Complaint  Patient presents with  . Fever  . Appointment    HPI Suzanne Tucker is a 40 y.o. female.   HPI Concern fever this AM 100.2. afebrile at present. Continues to have headache. Concern covid symptoms are returning. Afebrile at present. Seen by cardiologist x 1 day ago and afebrile. Patient has a distant hx of MVP and will start holter monitor. No other symptoms. 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 Original Covid dx is less than 30 days. Past Medical History:  Diagnosis Date  . Anginal pain (HCC) 02/2019   related to MVP  . Mitral prolapse     Patient Active Problem List   Diagnosis Date Noted  . Chronic pelvic pain in female 03/15/2019  . 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 Carpal tunnel syndrome on right 11/02/2017  . MVP (mitral valve prolapse) 10/28/2017    Past Surgical History:  Procedure Laterality Date  . CESAREAN SECTION  03/30/2008  . ROBOTIC ASSISTED TOTAL HYSTERECTOMY Bilateral 03/15/2019   Procedure: XI ROBOTIC ASSISTED TOTAL HYSTERECTOMY with Bilateral Salpingectomy;  Surgeon: Silverio Lay, MD;  Location: [facility];  Service: Gynecology;  Laterality: Bilateral;  . TUBAL LIGATION  2017    OB History   No obstetric history on file.      Home Medications    Prior to Admission medications   Medication Sig Start Date End Date Taking? Authorizing Provider  aspirin 81 MG chewable tablet Chew 81 mg by mouth daily.   Yes [provider]    Family History Family History  Problem Relation Age of Onset  . Healthy Mother   . Alzheimer's disease Father   . Diabetes Maternal Grandmother   . Heart disease Maternal Grandmother   . Heart attack Maternal Grandmother   . Diabetes Maternal Grandfather   . Heart disease Maternal Grandfather   . Alzheimer's disease Paternal Grandmother   . Dementia Paternal  Grandmother   . Alzheimer's disease Paternal Grandfather   . Dementia Paternal Grandfather   . Alcohol abuse Paternal Grandfather     Social History Social History   Tobacco Use  . Smoking status: Never Smoker  . Smokeless tobacco: Never Used  Vaping Use  . Vaping Use: Never used  Substance Use Topics  . Alcohol use: Not Currently    Comment:    . Drug use: No     Allergies   Morphine, Morphine and related, Tessalon [benzonatate], Hydrocodone, Latex, Codeine, and Tramadol   Review of Systems Review of Systems Pertinent negatives listed in HPI Physical Exam Triage Vital Signs ED Triage Vitals  Enc Vitals Group     BP 10/07/20 1903 (!) 143/84     Pulse Rate 10/07/20 1903 85     Resp 10/07/20 1903 17     Temp 10/07/20 1903 98.9 F (37.2 C)     Temp Source 10/07/20 1903 Oral     SpO2 10/07/20 1903 100 %     Weight 10/07/20 1905 150 lb (68 kg)     Height 10/07/20 1905 5\' 1"  (1.549 m)     Head Circumference --      Peak Flow --      Pain Score 10/07/20 1904 4     Pain Loc --      Pain Edu? --      Excl. in GC? --  No data found.  Updated Vital Signs BP (!) 143/84 (BP Location: Left Arm) Comment: BP high per pt - checks at home  Pulse 85   Temp 98.9 F (37.2 C) (Oral)   Resp 17   Ht 5\' 1"  (1.549 m)   Wt 150 lb (68 kg)   SpO2 100%   BMI 28.34 kg/m   Visual Acuity Right Eye Distance:   Left Eye Distance:   Bilateral Distance:    Right Eye Near:   Left Eye Near:    Bilateral Near:     Physical Exam Constitutional:      General: She is not in acute distress.    Appearance: She is not ill-appearing or toxic-appearing.  Cardiovascular:     Rate and Rhythm: Normal rate and regular rhythm.  Pulmonary:     Effort: Pulmonary effort is normal.     Breath sounds: Normal breath sounds.  Neurological:     Mental Status: She is alert.  Psychiatric:        Mood and Affect: Mood is anxious.        Speech: Speech normal.        Behavior: Behavior normal.         Cognition and Memory: Cognition normal.    UC Treatments / Results  Labs (all labs ordered are listed, but only abnormal results are displayed) Labs Reviewed - No data to display  EKG   Radiology No results found.  Procedures Procedures (including critical care time)  Medications Ordered in UC Medications - No data to display  Initial Impression / Assessment and Plan / UC Course  I have reviewed the triage vital signs and the nursing notes.  Pertinent labs & imaging results that were available during my care of the patient were reviewed by me and considered in my medical decision making (see chart for details).    Agreed to cover with a work note until PCP appointment in 2 days.  Patient exam today is grossly reassuring. No treatment warranted. No concerns for today for post COVID complication related to fever. Encouraged management of fever with Tylenol and ibuprofen if fever returns Final Clinical Impressions(s) / UC Diagnoses   Final diagnoses:  Fever, unspecified  COVID, Recent     Discharge Instructions     If additional time is needed, please follow-up with Dr. for additional time off.   ED Prescriptions    None     PDMP not reviewed this encounter.   Leonette Nutting, FNP 10/13/20 [phone]

## 2020-10-09 ENCOUNTER — Other Ambulatory Visit: Payer: Self-pay

## 2020-10-09 ENCOUNTER — Telehealth: Payer: BC Managed Care – PPO | Admitting: Emergency Medicine

## 2020-10-09 ENCOUNTER — Encounter: Payer: Self-pay | Admitting: Emergency Medicine

## 2020-10-09 VITALS — Ht 61.0 in | Wt 150.0 lb

## 2020-10-09 DIAGNOSIS — U071 COVID-19: Secondary | ICD-10-CM | POA: Diagnosis not present

## 2020-10-09 DIAGNOSIS — R509 Fever, unspecified: Secondary | ICD-10-CM | POA: Diagnosis not present

## 2020-10-09 DIAGNOSIS — R519 Headache, unspecified: Secondary | ICD-10-CM | POA: Diagnosis not present

## 2020-10-09 NOTE — Patient Instructions (Signed)
° ° ° °  If you have lab work done today you will be contacted with your lab results within the next 2 weeks.  If you have not heard from us then please contact us. The fastest way to get your results is to register for My Chart. ° ° °IF you received an x-ray today, you will receive an invoice from [facility]. Please contact  Radiology at [phone] with questions or concerns regarding your invoice.  ° °IF you received labwork today, you will receive an invoice from [facility]. Please contact [facility] at [phone] with questions or concerns regarding your invoice.  ° °Our billing staff will not be able to assist you with questions regarding bills from these companies. ° °You will be contacted with the lab results as soon as they are available. The fastest way to get your results is to activate your My Chart account. Instructions are located on the last page of this paperwork. If you have not heard from us regarding the results in 2 weeks, please contact this office. °  ° ° ° °

## 2020-10-09 NOTE — Progress Notes (Signed)
Telemedicine Encounter- SOAP NOTE Established Patient Patient: Home  Provider: Office     This telephone encounter was conducted with the patient's (or proxy's) verbal consent via audio telecommunications: yes/no: Yes Patient was instructed to have this encounter in a suitably private space; and to only have persons present to whom they give permission to participate. In addition, patient identity was confirmed by use of name plus two identifiers (DOB and address).  I discussed the limitations, risks, security and privacy concerns of performing an evaluation and management service by telephone and the availability of in person appointments. I also discussed with the patient that there may be a patient responsible charge related to this service. The patient expressed understanding and agreed to proceed.  I spent a total of TIME; 0 MIN TO 60 MIN: 20 minutes talking with the patient or their proxy.  Chief Complaint  Patient presents with  . Headache  . Fever    Subjective   Suzanne Tucker is a 40 y.o. female established patient. Telephone visit today for persistent Covid symptoms. Still running low-grade fevers with headaches and congestion.  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 Recently saw cardiologist due to palpitations.  Patient has history of mitral valve prolapse.  Scheduled for echocardiogram and Zio patch. Went to urgent care center 2 days ago for evaluation. Denies difficulty breathing.  Able to eat and drink.  Denies nausea or vomiting. No other complaints or medical concerns today.  HPI   Patient Active Problem List   Diagnosis Date Noted  . Chronic pelvic pain in female 03/15/2019  . 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 Carpal tunnel syndrome on right 11/02/2017  . MVP (mitral valve prolapse) 10/28/2017    Past Medical History:  Diagnosis Date  . Anginal pain (HCC) 02/2019   related to MVP  . Mitral prolapse     Current Outpatient Medications  Medication Sig Dispense  Refill  . aspirin 81 MG chewable tablet Chew 81 mg by mouth daily.     No current facility-administered medications for this visit.   Facility-Administered Medications Ordered in Other Visits  Medication Dose Route Frequency Provider Last Rate Last Admin  . gadopentetate dimeglumine (MAGNEVIST) injection 13 mL  13 mL Intravenous Once PRN Sater, Pearletha Furl, MD        Allergies  Allergen Reactions  . Morphine Shortness Of Breath  . Morphine And Related Anaphylaxis  . Tessalon [Benzonatate] Hives    Hives, itching, burning all over.   . Hydrocodone Hives  . Latex Rash  . Codeine Nausea And Vomiting  . Tramadol Other (See Comments)    Tingling in the mouth with swelling     Social History   Socioeconomic History  . Marital status: Single    Spouse name: Not on file  . Number of children: Not on file  . Years of education: Not on file  . Highest education level: Not on file  Occupational History  . Not on file  Tobacco Use  . Smoking status: Never Smoker  . Smokeless tobacco: Never Used  Vaping Use  . Vaping Use: Never used  Substance and Sexual Activity  . Alcohol use: Not Currently    Comment:    . Drug use: No  . Sexual activity: Never  Other Topics Concern  . Not on file  Social History Narrative  . Not on file   Social Determinants of Health   Financial Resource Strain: Not on file  Food Insecurity: Not on file  Transportation Needs: Not on  file  Physical Activity: Not on file  Stress: Not on file  Social Connections: Not on file  Intimate Partner Violence: Not on file    Review of Systems  Constitutional: Negative.  Negative for chills and fever.  HENT: Positive for congestion.   Respiratory: Negative.  Negative for shortness of breath.   Cardiovascular: Positive for palpitations.  Gastrointestinal: Negative.  Negative for abdominal pain, diarrhea, nausea and vomiting.  Genitourinary: Negative.   Musculoskeletal: Negative.  Negative for joint pain  and myalgias.  Skin: Negative.  Negative for rash.  Neurological: Positive for headaches.  All other systems reviewed and are negative.   Objective  Alert and oriented x3 in no apparent respiratory distress. Vitals as reported by the patient: Today's Vitals   10/09/20 0850  Weight: 150 lb (68 kg)  Height: 5\' 1"  (1.549 m)    There are no diagnoses linked to this encounter. Suzanne Tucker was seen today for headache and fever.  Diagnoses and all orders for this visit:  COVID-19 virus infection  Low grade fever  Persistent headaches  Clinically stable.  No red flag signs or symptoms. 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 ED precautions given.  Covid advice given. Advised to contact the office if no better or worse during the next several days.   I discussed the assessment and treatment plan with the patient. The patient was provided an opportunity to ask questions and all were answered. The patient agreed with the plan and demonstrated an understanding of the instructions.   The patient was advised to call back or seek an in-person evaluation if the symptoms worsen or if the condition fails to improve as anticipated.  I provided 20 minutes of non-face-to-face time during this encounter.  , MD  Primary Care at [facility]

## 2020-10-13 DIAGNOSIS — I341 Nonrheumatic mitral (valve) prolapse: Secondary | ICD-10-CM

## 2020-10-13 DIAGNOSIS — R002 Palpitations: Secondary | ICD-10-CM | POA: Diagnosis not present

## 2020-10-16 ENCOUNTER — Encounter: Payer: Self-pay | Admitting: Emergency Medicine

## 2020-10-20 NOTE — Telephone Encounter (Signed)
Not yet.  Maybe 1 to 2 weeks.  Depends on continued improvement.  Thanks.

## 2020-10-22 ENCOUNTER — Other Ambulatory Visit: Payer: Self-pay

## 2020-10-22 ENCOUNTER — Ambulatory Visit (HOSPITAL_COMMUNITY): Payer: BC Managed Care – PPO | Attending: Cardiovascular Disease

## 2020-10-22 DIAGNOSIS — I341 Nonrheumatic mitral (valve) prolapse: Secondary | ICD-10-CM | POA: Insufficient documentation

## 2020-10-22 DIAGNOSIS — R002 Palpitations: Secondary | ICD-10-CM

## 2020-10-22 LAB — ECHOCARDIOGRAM COMPLETE
Area-P 1/2: 4.19 cm2
S' Lateral: 3.1 cm

## 2020-10-23 ENCOUNTER — Ambulatory Visit: Payer: BC Managed Care – PPO | Admitting: Interventional Cardiology

## 2020-10-30 NOTE — Telephone Encounter (Signed)
Needs OV.  Thanks. 

## 2020-10-30 NOTE — Telephone Encounter (Signed)
Pt following up still having low grade fevers at nights as well as general fatigue. Pt requesting again lab work to check for explanation. Please advise

## 2020-11-04 ENCOUNTER — Telehealth: Payer: Self-pay

## 2020-11-04 ENCOUNTER — Ambulatory Visit: Payer: BC Managed Care – PPO | Admitting: Emergency Medicine

## 2020-11-04 ENCOUNTER — Encounter: Payer: Self-pay | Admitting: Emergency Medicine

## 2020-11-04 ENCOUNTER — Other Ambulatory Visit: Payer: Self-pay

## 2020-11-04 VITALS — BP 120/81 | HR 73 | Temp 97.7°F | Resp 16 | Ht 61.0 in | Wt 153.0 lb

## 2020-11-04 DIAGNOSIS — G9331 Postviral fatigue syndrome: Secondary | ICD-10-CM

## 2020-11-04 DIAGNOSIS — G933 Postviral fatigue syndrome: Secondary | ICD-10-CM

## 2020-11-04 DIAGNOSIS — U071 COVID-19: Secondary | ICD-10-CM

## 2020-11-04 DIAGNOSIS — H9192 Unspecified hearing loss, left ear: Secondary | ICD-10-CM | POA: Diagnosis not present

## 2020-11-04 NOTE — Progress Notes (Signed)
Suzanne Tucker 40 y.o.   Chief Complaint  Patient presents with  . Covid Exposure    Per patient follow up from virtual  patient was positive for Covid  . Hearing Loss    Per patient left ear only   . Referral    To ENT pending    HISTORY OF PRESENT ILLNESS: This is a 40 y.o. female status post recent Covid infection.  Doing better but still has some residual general weakness and lack of energy. Also has some diminished hearing from left ear for several months unrelated to Covid infection. No other complaints or medical concerns today.  Saw cardiologist for evaluation of palpitations recently.  Echocardiogram done last month normal.  Monitoring showed occasional PACs and PVCs.  Benign.  No other arrhythmias identified. Patient has history of mitral valve prolapse.  HPI   Prior to Admission medications   Medication Sig Start Date End Date Taking? Authorizing Provider  aspirin 81 MG chewable tablet Chew 81 mg by mouth daily.   Yes [provider]  Multiple Vitamin (MULTI-VITAMIN DAILY PO) Take by mouth daily.   Yes [provider]    Allergies  Allergen Reactions  . Morphine Shortness Of Breath  . Morphine And Related Anaphylaxis  . Tessalon [Benzonatate] Hives    Hives, itching, burning all over.   . Hydrocodone Hives  . Latex Rash  . Codeine Nausea And Vomiting  . Tramadol Other (See Comments)    Tingling in the mouth with swelling     Patient Active Problem List   Diagnosis Date Noted  . 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 Carpal tunnel syndrome on right 11/02/2017  . MVP (mitral valve prolapse) 10/28/2017    Past Medical History:  Diagnosis Date  . Anginal pain (HCC) 02/2019   related to MVP  . Mitral prolapse     Past Surgical History:  Procedure Laterality Date  . CESAREAN SECTION  03/30/2008  . ROBOTIC ASSISTED TOTAL HYSTERECTOMY Bilateral 03/15/2019   Procedure: XI ROBOTIC ASSISTED TOTAL HYSTERECTOMY with Bilateral Salpingectomy;   Surgeon: Silverio Layivard, Sandra, MD;  [facility];  Service: Gynecology;  Laterality: Bilateral;  . TUBAL LIGATION  2017    Social History   Socioeconomic History  . Marital status: Single    Spouse name: Not on file  . Number of children: Not on file  . Years of education: Not on file  . Highest education level: Not on file  Occupational History  . Not on file  Tobacco Use  . Smoking status: Never Smoker  . Smokeless tobacco: Never Used  Vaping Use  . Vaping Use: Never used  Substance and Sexual Activity  . Alcohol use: Not Currently    Comment:    . Drug use: No  . Sexual activity: Never  Other Topics Concern  . Not on file  Social History Narrative  . Not on file   Social Determinants of Health   Financial Resource Strain: Not on file  Food Insecurity: Not on file  Transportation Needs: Not on file  Physical Activity: Not on file  Stress: Not on file  Social Connections: Not on file  Intimate Partner Violence: Not on file    Family History  Problem Relation Age of Onset  . Healthy Mother   . Alzheimer's disease Father   . Diabetes Maternal Grandmother   . Heart disease Maternal Grandmother   . Heart attack Maternal Grandmother   . Diabetes Maternal Grandfather   . Heart disease Maternal  Grandfather   . Alzheimer's disease Paternal Grandmother   . Dementia Paternal Grandmother   . Alzheimer's disease Paternal Grandfather   . Dementia Paternal Grandfather   . Alcohol abuse Paternal Grandfather      Review of Systems  Constitutional: Positive for fever (Occasional low-grade fever at night).  HENT: Positive for hearing loss (Left ear). Negative for congestion and sore throat.   Respiratory: Negative.  Negative for cough and shortness of breath.   Cardiovascular: Positive for palpitations. Negative for chest pain and leg swelling.  Gastrointestinal: Negative.  Negative for abdominal pain, diarrhea, nausea and vomiting.  Genitourinary:  Negative.  Negative for dysuria and hematuria.  Musculoskeletal: Negative.  Negative for back pain and myalgias.  Skin: Negative.  Negative for rash.  Neurological: Negative.  Negative for dizziness and headaches.  All other systems reviewed and are negative.  Today's Vitals   11/04/20 1115  BP: 120/81  Pulse: 73  Resp: 16  Temp: 97.7 F (36.5 C)  TempSrc: Temporal  SpO2: 100%  Weight: 153 lb (69.4 kg)  Height: 5\' 1"  (1.549 m)   Body mass index is 28.91 kg/m.   Physical Exam Vitals reviewed.  Constitutional:      Appearance: Normal appearance.  HENT:     Head: Normocephalic.     Right Ear: Tympanic membrane, ear canal and external ear normal.     Left Ear: Tympanic membrane, ear canal and external ear normal.  Eyes:     Extraocular Movements: Extraocular movements intact.     Pupils: Pupils are equal, round, and reactive to light.  Cardiovascular:     Rate and Rhythm: Normal rate and regular rhythm.     Pulses: Normal pulses.     Heart sounds: Normal heart sounds.  Pulmonary:     Effort: Pulmonary effort is normal.     Breath sounds: Normal breath sounds.  Musculoskeletal:        General: Normal range of motion.     Cervical back: Normal range of motion and neck supple.  Skin:    General: Skin is warm and dry.     Capillary Refill: Capillary refill takes less than 2 seconds.  Neurological:     General: No focal deficit present.     Mental Status: She is alert and oriented to person, place, and time.  Psychiatric:        Mood and Affect: Mood normal.        Behavior: Behavior normal.      ASSESSMENT & PLAN: Suzanne Tucker was seen today for covid exposure, hearing loss and referral.  Diagnoses and all orders for this visit:  Post viral syndrome -     Cancel: Ambulatory referral to ENT -     SAR CoV2 Serology (COVID 19)AB(IGG)IA -     Comprehensive metabolic panel -     CBC with Differential/Platelet -     Hemoglobin A1c -     TSH  COVID-19 virus  infection Comments: Recent and much improved  Decreased hearing of left ear -     Ambulatory referral to ENT    Patient Instructions       If you have lab work done today you will be contacted with your lab results within the next 2 weeks.  If you have not heard from [place] then please contact us. The fastest way to get your results is to register for My Chart.   IF you received an x-ray today, you will receive an invoice from [facility].  Please contact [facility] at [phone] with questions or concerns regarding your invoice.   IF you received labwork today, you will receive an invoice from [facility]. Please contact [facility] at [phone] with questions or concerns regarding your invoice.   Our billing staff will not be able to assist you with questions regarding bills from these companies.  You will be contacted with the lab results as soon as they are available. The fastest way to get your results is to activate your My Chart account. Instructions are located on the last page of this paperwork. If you have not heard from [place] regarding the results in 2 weeks, please contact this office.     Health Maintenance, Female Adopting a healthy lifestyle and getting preventive care are important in promoting health and wellness. Ask your health care provider about:  The right schedule for you to have regular tests and exams.  Things you can do on your own to prevent diseases and keep yourself healthy. What should I know about diet, weight, and exercise? Eat a healthy diet  Eat a diet that includes plenty of vegetables, fruits, low-fat dairy products, and lean protein.  Do not eat a lot of foods that are high in solid fats, added sugars, or sodium.   Maintain a healthy weight Body mass index (BMI) is used to identify weight problems. It estimates body fat based on height and weight. Your health care provider can help determine your BMI and help you achieve or  maintain a healthy weight. Get regular exercise Get regular exercise. This is one of the most important things you can do for your health. Most adults should:  Exercise for at least 150 minutes each week. The exercise should increase your heart rate and make you sweat (moderate-intensity exercise).  Do strengthening exercises at least twice a week. This is in addition to the moderate-intensity exercise.  Spend less time sitting. Even light physical activity can be beneficial. Watch cholesterol and blood lipids Have your blood tested for lipids and cholesterol at 38 years of age, then have this test every 5 years. Have your cholesterol levels checked more often if:  Your lipid or cholesterol levels are high.  You are older than 38 years of age.  You are at high risk for heart disease. What should I know about cancer screening? Depending on your health history and family history, you may need to have cancer screening at various ages. This may include screening for:  Breast cancer.  Cervical cancer.  Colorectal cancer.  Skin cancer.  Lung cancer. What should I know about heart disease, diabetes, and high blood pressure? Blood pressure and heart disease  High blood pressure causes heart disease and increases the risk of stroke. This is more likely to develop in people who have high blood pressure readings, are of African descent, or are overweight.  Have your blood pressure checked: ? Every 3-5 years if you are 60-28 years of age. ? Every year if you are 70 years old or older. Diabetes Have regular diabetes screenings. This checks your fasting blood sugar level. Have the screening done:  Once every three years after age 63 if you are at a normal weight and have a low risk for diabetes.  More often and at a younger age if you are overweight or have a high risk for diabetes. What should I know about preventing infection? Hepatitis B If you have a higher risk for hepatitis B,  you should be screened for this virus.  Talk with your health care provider to find out if you are at risk for hepatitis B infection. Hepatitis C Testing is recommended for:  Everyone born from [birth year] through 1963.  Anyone with known risk factors for hepatitis C. Sexually transmitted infections (STIs)  Get screened for STIs, including gonorrhea and chlamydia, if: ? You are sexually active and are younger than 38 years of age. ? You are older than 38 years of age and your health care provider tells you that you are at risk for this type of infection. ? Your sexual activity has changed since you were last screened, and you are at increased risk for chlamydia or gonorrhea. Ask your health care provider if you are at risk.  Ask your health care provider about whether you are at high risk for HIV. Your health care provider may recommend a prescription medicine to help prevent HIV infection. If you choose to take medicine to prevent HIV, you should first get tested for HIV. You should then be tested every 3 months for as long as you are taking the medicine. Pregnancy  If you are about to stop having your period (premenopausal) and you may become pregnant, seek counseling before you get pregnant.  Take 400 to 800 micrograms (mcg) of folic acid every day if you become pregnant.  Ask for birth control (contraception) if you want to prevent pregnancy. Osteoporosis and menopause Osteoporosis is a disease in which the bones lose minerals and strength with aging. This can result in bone fractures. If you are 24 years old or older, or if you are at risk for osteoporosis and fractures, ask your health care provider if you should:  Be screened for bone loss.  Take a calcium or vitamin D supplement to lower your risk of fractures.  Be given hormone replacement therapy (HRT) to treat symptoms of menopause. Follow these instructions at home: Lifestyle  Do not use any products that contain nicotine or  tobacco, such as cigarettes, e-cigarettes, and chewing tobacco. If you need help quitting, ask your health care provider.  Do not use street drugs.  Do not share needles.  Ask your health care provider for help if you need support or information about quitting drugs. Alcohol use  Do not drink alcohol if: ? Your health care provider tells you not to drink. ? You are pregnant, may be pregnant, or are planning to become pregnant.  If you drink alcohol: ? Limit how much you use to 0-1 drink a day. ? Limit intake if you are breastfeeding.  Be aware of how much alcohol is in your drink. In the [place] one drink equals one 12 oz bottle of beer (355 mL), one 5 oz glass of wine (148 mL), or one 1 oz glass of hard liquor (44 mL). General instructions  Schedule regular health, dental, and eye exams.  Stay current with your vaccines.  Tell your health care provider if: ? You often feel depressed. ? You have ever been abused or do not feel safe at home. Summary  Adopting a healthy lifestyle and getting preventive care are important in promoting health and wellness.  Follow your health care provider's instructions about healthy diet, exercising, and getting tested or screened for diseases.  Follow your health care provider's instructions on monitoring your cholesterol and blood pressure. This information is not intended to replace advice given to you by your health care provider. Make sure you discuss any questions you have with your health care provider. Document Revised: 09/13/2018  Document Reviewed: 09/13/2018 Elsevier Patient Education  2021 Elsevier Inc.      Edwina Barth, MD [facility]

## 2020-11-04 NOTE — Telephone Encounter (Signed)
Attempted phone call to pt.  Per Epic note, OK to leave detailed voicemail message.  Pt advised per Dr Eldridge Dace, monitor shows no significant abnormalities some symptoms associated with PVC's which are benign.  Pt advised to call with any questions or concerns.

## 2020-11-04 NOTE — Telephone Encounter (Signed)
-----   Message from Corky Crafts, MD sent at 11/04/2020  9:30 AM EST ----- Some sx with PVCs which are benign.  No significant abnormalities.

## 2020-11-04 NOTE — Patient Instructions (Addendum)
   If you have lab work done today you will be contacted with your lab results within the next 2 weeks.  If you have not heard from us then please contact us. The fastest way to get your results is to register for My Chart.   IF you received an x-ray today, you will receive an invoice from [facility]. Please contact [facility] at [phone] with questions or concerns regarding your invoice.   IF you received labwork today, you will receive an invoice from [facility]. Please contact [facility] at [phone] with questions or concerns regarding your invoice.   Our billing staff will not be able to assist you with questions regarding bills from these companies.  You will be contacted with the lab results as soon as they are available. The fastest way to get your results is to activate your My Chart account. Instructions are located on the last page of this paperwork. If you have not heard from us regarding the results in 2 weeks, please contact this office.     Health Maintenance, Female Adopting a healthy lifestyle and getting preventive care are important in promoting health and wellness. Ask your health care provider about:  The right schedule for you to have regular tests and exams.  Things you can do on your own to prevent diseases and keep yourself healthy. What should I know about diet, weight, and exercise? Eat a healthy diet  Eat a diet that includes plenty of vegetables, fruits, low-fat dairy products, and lean protein.  Do not eat a lot of foods that are high in solid fats, added sugars, or sodium.   Maintain a healthy weight Body mass index (BMI) is used to identify weight problems. It estimates body fat based on height and weight. Your health care provider can help determine your BMI and help you achieve or maintain a healthy weight. Get regular exercise Get regular exercise. This is one of the most important things you can do for your health. Most  adults should:  Exercise for at least 150 minutes each week. The exercise should increase your heart rate and make you sweat (moderate-intensity exercise).  Do strengthening exercises at least twice a week. This is in addition to the moderate-intensity exercise.  Spend less time sitting. Even light physical activity can be beneficial. Watch cholesterol and blood lipids Have your blood tested for lipids and cholesterol at 38 years of age, then have this test every 5 years. Have your cholesterol levels checked more often if:  Your lipid or cholesterol levels are high.  You are older than 38 years of age.  You are at high risk for heart disease. What should I know about cancer screening? Depending on your health history and family history, you may need to have cancer screening at various ages. This may include screening for:  Breast cancer.  Cervical cancer.  Colorectal cancer.  Skin cancer.  Lung cancer. What should I know about heart disease, diabetes, and high blood pressure? Blood pressure and heart disease  High blood pressure causes heart disease and increases the risk of stroke. This is more likely to develop in people who have high blood pressure readings, are of African descent, or are overweight.  Have your blood pressure checked: ? Every 3-5 years if you are 18-39 years of age. ? Every year if you are 40 years old or older. Diabetes Have regular diabetes screenings. This checks your fasting blood sugar level. Have the screening done:  Once every   three years after age 40 if you are at a normal weight and have a low risk for diabetes.  More often and at a younger age if you are overweight or have a high risk for diabetes. What should I know about preventing infection? Hepatitis B If you have a higher risk for hepatitis B, you should be screened for this virus. Talk with your health care provider to find out if you are at risk for hepatitis B infection. Hepatitis  C Testing is recommended for:  Everyone born from 1945 through 1965.  Anyone with known risk factors for hepatitis C. Sexually transmitted infections (STIs)  Get screened for STIs, including gonorrhea and chlamydia, if: ? You are sexually active and are younger than 38 years of age. ? You are older than 38 years of age and your health care provider tells you that you are at risk for this type of infection. ? Your sexual activity has changed since you were last screened, and you are at increased risk for chlamydia or gonorrhea. Ask your health care provider if you are at risk.  Ask your health care provider about whether you are at high risk for HIV. Your health care provider may recommend a prescription medicine to help prevent HIV infection. If you choose to take medicine to prevent HIV, you should first get tested for HIV. You should then be tested every 3 months for as long as you are taking the medicine. Pregnancy  If you are about to stop having your period (premenopausal) and you may become pregnant, seek counseling before you get pregnant.  Take 400 to 800 micrograms (mcg) of folic acid every day if you become pregnant.  Ask for birth control (contraception) if you want to prevent pregnancy. Osteoporosis and menopause Osteoporosis is a disease in which the bones lose minerals and strength with aging. This can result in bone fractures. If you are 65 years old or older, or if you are at risk for osteoporosis and fractures, ask your health care provider if you should:  Be screened for bone loss.  Take a calcium or vitamin D supplement to lower your risk of fractures.  Be given hormone replacement therapy (HRT) to treat symptoms of menopause. Follow these instructions at home: Lifestyle  Do not use any products that contain nicotine or tobacco, such as cigarettes, e-cigarettes, and chewing tobacco. If you need help quitting, ask your health care provider.  Do not use street  drugs.  Do not share needles.  Ask your health care provider for help if you need support or information about quitting drugs. Alcohol use  Do not drink alcohol if: ? Your health care provider tells you not to drink. ? You are pregnant, may be pregnant, or are planning to become pregnant.  If you drink alcohol: ? Limit how much you use to 0-1 drink a day. ? Limit intake if you are breastfeeding.  Be aware of how much alcohol is in your drink. In the [place] one drink equals one 12 oz bottle of beer (355 mL), one 5 oz glass of wine (148 mL), or one 1 oz glass of hard liquor (44 mL). General instructions  Schedule regular health, dental, and eye exams.  Stay current with your vaccines.  Tell your health care provider if: ? You often feel depressed. ? You have ever been abused or do not feel safe at home. Summary  Adopting a healthy lifestyle and getting preventive care are important in promoting health and wellness.    Follow your health care provider's instructions about healthy diet, exercising, and getting tested or screened for diseases.  Follow your health care provider's instructions on monitoring your cholesterol and blood pressure. This information is not intended to replace advice given to you by your health care provider. Make sure you discuss any questions you have with your health care provider. Document Revised: 09/13/2018 Document Reviewed: 09/13/2018 Elsevier Patient Education  2021 Elsevier Inc.  

## 2020-11-05 LAB — HEMOGLOBIN A1C
Est. average glucose Bld gHb Est-mCnc: 103 mg/dL
Hgb A1c MFr Bld: 5.2 % (ref 4.8–5.6)

## 2020-11-05 LAB — COMPREHENSIVE METABOLIC PANEL
ALT: 12 IU/L (ref 0–32)
AST: 23 IU/L (ref 0–40)
Albumin/Globulin Ratio: 1.8 (ref 1.2–2.2)
Albumin: 4.5 g/dL (ref 3.8–4.8)
Alkaline Phosphatase: 73 IU/L (ref 44–121)
BUN/Creatinine Ratio: 13 (ref 9–23)
BUN: 8 mg/dL (ref 6–20)
Bilirubin Total: 0.4 mg/dL (ref 0.0–1.2)
CO2: 23 mmol/L (ref 20–29)
Calcium: 9.5 mg/dL (ref 8.7–10.2)
Chloride: 102 mmol/L (ref 96–106)
Creatinine, Ser: 0.64 mg/dL (ref 0.57–1.00)
GFR calc Af Amer: 132 mL/min/{1.73_m2} (ref >59)
GFR calc non Af Amer: 114 mL/min/{1.73_m2} (ref >59)
Globulin, Total: 2.5 g/dL (ref 1.5–4.5)
Glucose: 87 mg/dL (ref 65–99)
Potassium: 4.4 mmol/L (ref 3.5–5.2)
Sodium: 138 mmol/L (ref 134–144)
Total Protein: 7 g/dL (ref 6.0–8.5)

## 2020-11-05 LAB — CBC WITH DIFFERENTIAL/PLATELET
Basophils Absolute: 0 10*3/uL (ref 0.0–0.2)
Basos: 0 %
EOS (ABSOLUTE): 0.1 10*3/uL (ref 0.0–0.4)
Eos: 2 %
Hematocrit: 38.7 % (ref 34.0–46.6)
Hemoglobin: 12.3 g/dL (ref 11.1–15.9)
Immature Grans (Abs): 0 10*3/uL (ref 0.0–0.1)
Immature Granulocytes: 0 %
Lymphocytes Absolute: 1.6 10*3/uL (ref 0.7–3.1)
Lymphs: 28 %
MCH: 28.1 pg (ref 26.6–33.0)
MCHC: 31.8 g/dL (ref 31.5–35.7)
MCV: 88 fL (ref 79–97)
Monocytes Absolute: 0.4 10*3/uL (ref 0.1–0.9)
Monocytes: 7 %
Neutrophils Absolute: 3.6 10*3/uL (ref 1.4–7.0)
Neutrophils: 63 %
Platelets: 306 10*3/uL (ref 150–450)
RBC: 4.38 x10E6/uL (ref 3.77–5.28)
RDW: 12.2 % (ref 11.7–15.4)
WBC: 5.8 10*3/uL (ref 3.4–10.8)

## 2020-11-05 LAB — SAR COV2 SEROLOGY (COVID19)AB(IGG),IA
SARS-CoV-2 Semi-Quant IgG Ab: >800 AU/mL (ref <13.0)
SARS-CoV-2 Spike Ab Interp: POSITIVE

## 2020-11-05 LAB — TSH: TSH: 1.35 u[IU]/mL (ref 0.450–4.500)

## 2020-12-01 ENCOUNTER — Emergency Department
Admission: RE | Admit: 2020-12-01 | Discharge: 2020-12-01 | Disposition: A | Discharge status: Home / Self Care | Payer: BC Managed Care – PPO | Source: Ambulatory Visit

## 2020-12-01 ENCOUNTER — Other Ambulatory Visit: Payer: Self-pay

## 2020-12-01 VITALS — BP 132/85 | HR 78 | Temp 98.2°F | Resp 18 | Ht 61.0 in | Wt 150.0 lb

## 2020-12-01 DIAGNOSIS — J40 Bronchitis, not specified as acute or chronic: Secondary | ICD-10-CM | POA: Diagnosis not present

## 2020-12-01 MED ORDER — ALBUTEROL SULFATE HFA 108 (90 BASE) MCG/ACT IN AERS: 2.0000 | INHALATION_SPRAY | Freq: Once | RESPIRATORY_TRACT | Status: DC

## 2020-12-01 NOTE — ED Provider Notes (Signed)
[facility]   [id number] 12/01/20 Arrival Time: 1731   CC: COVID symptoms  SUBJECTIVE: History from: patient.  Suzanne Tucker is a 40 y.o. female who presents with dry cough, headache, body aches, and fatigue for the last week. Denies sick exposure to COVID, flu or strep. Denies recent travel. Has positive history of Covid x 2 months ago. Has had the first Covid vaccine. Has taken OTC cough and cold with little relief. Cough and fatigue are worse with activity. Denies previous symptoms in the past. Denies fever, sinus pain, rhinorrhea, sore throat, SOB, wheezing, chest pain, nausea, changes in bowel or bladder habits.    ROS: As per HPI.  All other pertinent ROS negative.     Past Medical History:  Diagnosis Date  . Anginal pain (HCC) 02/2019   related to MVP  . Mitral prolapse    Past Surgical History:  Procedure Laterality Date  . CESAREAN SECTION  03/30/2008  . ROBOTIC ASSISTED TOTAL HYSTERECTOMY Bilateral 03/15/2019   Procedure: XI ROBOTIC ASSISTED TOTAL HYSTERECTOMY with Bilateral Salpingectomy;  Surgeon: Silverio Lay, MD;  Location: [facility] [place];  Service: Gynecology;  Laterality: Bilateral;  . TUBAL LIGATION  2017   Allergies  Allergen Reactions  . Morphine Shortness Of Breath  . Morphine And Related Anaphylaxis  . Tessalon [Benzonatate] Hives    Hives, itching, burning all over.   . Hydrocodone Hives  . Latex Rash  . Codeine Nausea And Vomiting  . Tramadol Other (See Comments)    Tingling in the mouth with swelling    Current Facility-Administered Medications on File Prior to Encounter  Medication Dose Route Frequency Provider Last Rate Last Admin  . gadopentetate dimeglumine (MAGNEVIST) injection 13 mL  13 mL Intravenous Once PRN Sater, Pearletha Furl, MD       Current Outpatient Medications on File Prior to Encounter  Medication Sig Dispense Refill  . aspirin 81 MG chewable tablet Chew 81 mg by mouth daily.    . Multiple Vitamin  (MULTI-VITAMIN DAILY PO) Take by mouth daily.     Social History   Socioeconomic History  . Marital status: Single    Spouse name: Not on file  . Number of children: Not on file  . Years of education: Not on file  . Highest education level: Not on file  Occupational History  . Not on file  Tobacco Use  . Smoking status: Never Smoker  . Smokeless tobacco: Never Used  Vaping Use  . Vaping Use: Never used  Substance and Sexual Activity  . Alcohol use: Not Currently    Comment:    . Drug use: No  . Sexual activity: Never  Other Topics Concern  . Not on file  Social History Narrative  . Not on file   Social Determinants of Health   Financial Resource Strain: Not on file  Food Insecurity: Not on file  Transportation Needs: Not on file  Physical Activity: Not on file  Stress: Not on file  Social Connections: Not on file  Intimate Partner Violence: Not on file   Family History  Problem Relation Age of Onset  . Healthy Mother   . Alzheimer's disease Father   . Diabetes Maternal Grandmother   . Heart disease Maternal Grandmother   . Heart attack Maternal Grandmother   . Diabetes Maternal Grandfather   . Heart disease Maternal Grandfather   . Alzheimer's disease Paternal Grandmother   . Dementia Paternal Grandmother   . Alzheimer's disease Paternal Grandfather   .  Dementia Paternal Grandfather   . Alcohol abuse Paternal Grandfather     OBJECTIVE:  Vitals:   12/01/20 1743 12/01/20 1744  BP: 132/85   Pulse: 78   Resp: 18   Temp: 98.2 F (36.8 C)   TempSrc: Oral   SpO2: 100%   Weight:  150 lb (68 kg)  Height:  5\' 1"  (1.549 m)     General appearance: alert; appears fatigued, but nontoxic; speaking in full sentences and tolerating own secretions HEENT: NCAT; Ears: EACs clear, TMs pearly gray; Eyes: PERRL.  EOM grossly intact. Sinuses: nontender; Nose: nares patent with clear rhinorrhea, Throat: oropharynx erythematous, cobblestoning present, tonsils non  erythematous or enlarged, uvula midline  Neck: supple without LAD Lungs: unlabored respirations, symmetrical air entry; cough: moderate; no respiratory distress; mild wheezing to bilateral lower lobes Heart: regular rate and rhythm.  Radial pulses 2+ symmetrical bilaterally Skin: warm and dry Psychological: alert and cooperative; normal mood and affect  LABS:  No results found for this or any previous visit (from the past 24 hour(s)).   ASSESSMENT & PLAN:  1. Bronchitis     Meds ordered this encounter  Medications  . albuterol (VENTOLIN HFA) 108 (90 Base) MCG/ACT inhaler 2 puff   Albuterol inhaler in office today Continue supportive care at home Get plenty of rest and push fluids Use OTC zyrtec for nasal congestion, runny nose, and/or sore throat Use OTC flonase for nasal congestion and runny nose Use medications daily for symptom relief Use OTC medications like ibuprofen or tylenol as needed fever or pain Call or go to the ED if you have any new or worsening symptoms such as fever, worsening cough, shortness of breath, chest tightness, chest pain, turning blue, changes in mental status.  Reviewed expectations re: course of current medical issues. Questions answered. Outlined signs and symptoms indicating need for more acute intervention. Patient verbalized understanding. After Visit Summary given.         , NP 12/01/20 1835

## 2020-12-01 NOTE — ED Triage Notes (Signed)
Dry cough, headache, fatigue x 1 week 1st vaccine

## 2020-12-01 NOTE — Discharge Instructions (Signed)
I have sent in an albuterol inhaler for you to use 2 puffs every 4-6 hours as needed for cough, shortness of breath, wheezing.  I think that this is airway inflammation and will take some time to get over  Rest, push fluids, ibuprofen and tylenol for aches and fever as needed  Follow up with this office or with primary care if symptoms are persisting.  Follow up in the ER for high fever, trouble swallowing, trouble breathing, other concerning symptoms.

## 2020-12-02 ENCOUNTER — Encounter: Payer: Self-pay | Admitting: Emergency Medicine

## 2020-12-03 ENCOUNTER — Encounter: Payer: Self-pay | Admitting: Emergency Medicine

## 2020-12-03 ENCOUNTER — Telehealth (INDEPENDENT_AMBULATORY_CARE_PROVIDER_SITE_OTHER): Payer: BC Managed Care – PPO | Admitting: Emergency Medicine

## 2020-12-03 ENCOUNTER — Other Ambulatory Visit: Payer: Self-pay

## 2020-12-03 VITALS — Ht 61.0 in | Wt 150.0 lb

## 2020-12-03 DIAGNOSIS — J988 Other specified respiratory disorders: Secondary | ICD-10-CM | POA: Diagnosis not present

## 2020-12-03 DIAGNOSIS — R059 Cough, unspecified: Secondary | ICD-10-CM

## 2020-12-03 DIAGNOSIS — R6889 Other general symptoms and signs: Secondary | ICD-10-CM

## 2020-12-03 DIAGNOSIS — B9789 Other viral agents as the cause of diseases classified elsewhere: Secondary | ICD-10-CM

## 2020-12-03 NOTE — Patient Instructions (Signed)
° ° ° °  If you have lab work done today you will be contacted with your lab results within the next 2 weeks.  If you have not heard from us then please contact us. The fastest way to get your results is to register for My Chart. ° ° °IF you received an x-ray today, you will receive an invoice from [facility]. Please contact [facility] at [phone] with questions or concerns regarding your invoice.  ° °IF you received labwork today, you will receive an invoice from [facility]. Please contact [facility] at [phone] with questions or concerns regarding your invoice.  ° °Our billing staff will not be able to assist you with questions regarding bills from these companies. ° °You will be contacted with the lab results as soon as they are available. The fastest way to get your results is to activate your My Chart account. Instructions are located on the last page of this paperwork. If you have not heard from us regarding the results in 2 weeks, please contact this office. °  ° ° ° °

## 2020-12-03 NOTE — Progress Notes (Addendum)
Telemedicine Encounter- SOAP NOTE Established Patient My chart video encounter This video encounter was conducted with the patient's (or proxy's) verbal consent via video telecommunications: yes/no: Yes Patient was instructed to have this encounter in a suitably private space; and to only have persons present to whom they give permission to participate. In addition, patient identity was confirmed by use of name plus two identifiers (DOB and address).  I discussed the limitations, risks, security and privacy concerns of performing an evaluation and management service by telephone and the availability of in person appointments. I also discussed with the patient that there may be a patient responsible charge related to this service. The patient expressed understanding and agreed to proceed.  I spent a total of TIME; 0 MIN TO 60 MIN: 20 minutes talking with the patient or their proxy.  Chief Complaint  Patient presents with  . Cough    Per patient dry and in the beginning it was with color, now clear. Patient was seen at Urgent Care 12/01/2020.  Marland Kitchen Nasal Congestion    Per patient with earache (both) feels stuffy with pressure started Tuesday last week, still feel feverish.    Subjective   Suzanne Tucker is a 40 y.o. female established patient. Telephone visit today complaining of flulike symptoms that started 1 week ago.  Worse last Friday, better today but still having general weakness and cough.  Symptoms slowly getting better. Had Covid infection last January.  Positive Covid antibody with very good titers. No other significant symptoms or other medical concerns today.  HPI   Patient Active Problem List   Diagnosis Date Noted  . 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 Carpal tunnel syndrome on right 11/02/2017  . MVP (mitral valve prolapse) 10/28/2017    Past Medical History:  Diagnosis Date  . Anginal pain (HCC) 02/2019   related to MVP  . Mitral prolapse     Current Outpatient  Medications  Medication Sig Dispense Refill  . aspirin 81 MG chewable tablet Chew 81 mg by mouth daily.    . Multiple Vitamin (MULTI-VITAMIN DAILY PO) Take by mouth daily.     No current facility-administered medications for this visit.   Facility-Administered Medications Ordered in Other Visits  Medication Dose Route Frequency Provider Last Rate Last Admin  . gadopentetate dimeglumine (MAGNEVIST) injection 13 mL  13 mL Intravenous Once PRN Sater, Pearletha Furl, MD        Allergies  Allergen Reactions  . Morphine Shortness Of Breath  . Morphine And Related Anaphylaxis  . Tessalon [Benzonatate] Hives    Hives, itching, burning all over.   . Hydrocodone Hives  . Latex Rash  . Codeine Nausea And Vomiting  . Tramadol Other (See Comments)    Tingling in the mouth with swelling     Social History   Socioeconomic History  . Marital status: Single    Spouse name: Not on file  . Number of children: Not on file  . Years of education: Not on file  . Highest education level: Not on file  Occupational History  . Not on file  Tobacco Use  . Smoking status: Never Smoker  . Smokeless tobacco: Never Used  Vaping Use  . Vaping Use: Never used  Substance and Sexual Activity  . Alcohol use: Not Currently    Comment:    . Drug use: No  . Sexual activity: Never  Other Topics Concern  . Not on file  Social History Narrative  . Not on file  Social Determinants of Health   Financial Resource Strain: Not on file  Food Insecurity: Not on file  Transportation Needs: Not on file  Physical Activity: Not on file  Stress: Not on file  Social Connections: Not on file  Intimate Partner Violence: Not on file    Review of Systems  Constitutional: Negative.  Negative for chills and fever.  HENT: Positive for congestion and sore throat.   Respiratory: Positive for cough. Negative for shortness of breath.   Cardiovascular: Negative.  Negative for chest pain and palpitations.   Gastrointestinal: Negative.  Negative for abdominal pain, diarrhea, nausea and vomiting.  Genitourinary: Negative.  Negative for dysuria and hematuria.  Skin: Negative.  Negative for rash.  Neurological: Negative.  Negative for dizziness and headaches.  All other systems reviewed and are negative.   Objective  Alert and oriented x3 in no apparent respiratory distress. Vitals as reported by the patient: Today's Vitals   12/03/20 1351  Weight: 150 lb (68 kg)  Height: 5\' 1"  (1.549 m)    There are no diagnoses linked to this encounter. Suzanne Tucker was seen today for cough and nasal congestion.  Diagnoses and all orders for this visit:  Flu-like symptoms  Cough  Viral respiratory illness   Clinically stable.  No red flag signs or symptoms. Supportive treatment.  Anticipatory guidance given. ED precautions given. Advised to contact the office if no better or worse during the next several days.  I discussed the assessment and treatment plan with the patient. The patient was provided an opportunity to ask questions and all were answered. The patient agreed with the plan and demonstrated an understanding of the instructions.   The patient was advised to call back or seek an in-person evaluation if the symptoms worsen or if the condition fails to improve as anticipated.  I provided 20 minutes of non-face-to-face time during this encounter.  , MD  Primary Care at [facility]

## 2020-12-05 ENCOUNTER — Encounter: Payer: Self-pay | Admitting: Emergency Medicine

## 2020-12-05 ENCOUNTER — Other Ambulatory Visit: Payer: Self-pay

## 2020-12-05 ENCOUNTER — Telehealth: Payer: Self-pay | Admitting: Emergency Medicine

## 2020-12-05 ENCOUNTER — Ambulatory Visit: Payer: Self-pay | Admitting: *Deleted

## 2020-12-05 ENCOUNTER — Other Ambulatory Visit: Payer: Self-pay | Admitting: Emergency Medicine

## 2020-12-05 ENCOUNTER — Telehealth (INDEPENDENT_AMBULATORY_CARE_PROVIDER_SITE_OTHER): Payer: BC Managed Care – PPO | Admitting: Family Medicine

## 2020-12-05 VITALS — Ht 61.0 in | Wt 150.0 lb

## 2020-12-05 DIAGNOSIS — R059 Cough, unspecified: Secondary | ICD-10-CM | POA: Diagnosis not present

## 2020-12-05 DIAGNOSIS — J22 Unspecified acute lower respiratory infection: Secondary | ICD-10-CM | POA: Diagnosis not present

## 2020-12-05 MED ORDER — AZITHROMYCIN 250 MG PO TABS
ORAL_TABLET | ORAL | 0 refills | Status: DC
Start: 2020-12-05 — End: 2020-12-05

## 2020-12-05 MED ORDER — AZITHROMYCIN 250 MG PO TABS: ORAL_TABLET | ORAL | 0 refills | Status: DC

## 2020-12-05 NOTE — Telephone Encounter (Signed)
Patient is calling in asking for an antibiotic , discussed with provider on Virtual Visit  12/03/2020   Patient feels she is not getting any better

## 2020-12-05 NOTE — Telephone Encounter (Signed)
Patient is calling to report she is not better( visit 3/2- bronchitis)- she was advised to call back if she did not improve. Call to office-they can see her today- call transferred for scheduling.  Reason for Disposition . [1] MILD difficulty breathing (e.g., minimal/no SOB at rest, SOB with walking, pulse <100) AND [2] NEW-onset or WORSE than normal  Answer Assessment - Initial Assessment Questions 1. RESPIRATORY STATUS: "Describe your breathing?" (e.g., wheezing, shortness of breath, unable to speak, severe coughing)      Painful to take breath in, coughing and wheezing- diagnosed with bronchitis 2. ONSET: "When did this breathing problem begin?"      Yesterday- pain started 3. PATTERN "Does the difficult breathing come and go, or has it been constant since it started?"      Comes and goes 4. SEVERITY: "How bad is your breathing?" (e.g., mild, moderate, severe)    - MILD: No SOB at rest, mild SOB with walking, speaks normally in sentences, can lay down, no retractions, pulse < 100.    - MODERATE: SOB at rest, SOB with minimal exertion and prefers to sit, cannot lie down flat, speaks in phrases, mild retractions, audible wheezing, pulse 100-120.    - SEVERE: Very SOB at rest, speaks in single words, struggling to breathe, sitting hunched forward, retractions, pulse > 120      mild 5. RECURRENT SYMPTOM: "Have you had difficulty breathing before?" If Yes, ask: "When was the last time?" and "What happened that time?"      Past COVID diagnosis- now bronchitis 6. CARDIAC HISTORY: "Do you have any history of heart disease?" (e.g., heart attack, angina, bypass surgery, angioplasty)      no 7. LUNG HISTORY: "Do you have any history of lung disease?"  (e.g., pulmonary embolus, asthma, emphysema)     no 8. CAUSE: "What do you think is causing the breathing problem?"     brochitis 9. OTHER SYMPTOMS: "Do you have any other symptoms? (e.g., dizziness, runny nose, cough, chest pain, fever)      Cough 10. PREGNANCY: "Is there any chance you are pregnant?" "When was your last menstrual period?"       n/a 11. TRAVEL: "Have you traveled out of the country in the last month?" (e.g., travel history, exposures)       n/a  Protocols used: BREATHING DIFFICULTY-A-AH

## 2020-12-05 NOTE — Progress Notes (Signed)
Virtual Visit via Telephone Note  I connected with Suzanne Tucker on 12/07/20 at 2:31 PM by telephone and verified that I am speaking with the correct person using two identifiers. Patient location: in store, private location.  My location: office   I discussed the limitations, risks, security and privacy concerns of performing an evaluation and management service by telephone and the availability of in person appointments. I also discussed with the patient that there may be a patient responsible charge related to this service. The patient expressed understanding and agreed to proceed, consent obtained  Chief complaint:  Chief Complaint  Patient presents with  . Cough    Pt states that she still has a cough from 3/2 and states she is having some chest pain. Pt states that she is having some SOB and was wanting an antibiotic.     History of Present Illness: Suzanne Tucker is a 39 y.o. female  Has been recently treated by Dr. Alvy Bimler.  Most recent video visit March 2.  Present note flulike symptoms starting 1 week prior.  Worse last Friday but better at the March 2 video visit.  Still some general weakness and cough.  Symptoms are slowly getting better.  Continue symptomatic care discussed. She did have positive Covid antibody greater than 800 on serology February 1. Has not covid test with current illness (covid infection in 09/2020).   Feels like she may not have been getting better at last visit. Feels about the same past few days  Went to work today. Coughing spells at work today, some chest soreness with coughing spell only. No known fever.  Short of breath with walking at times past 4-5 days.  Saw Urgent care March 1st - told lungs clear. Thinks O2 was ok at urgent care. same dyspnea as that visit.  No recent abx.  Dry cough.     Patient Active Problem List   Diagnosis Date Noted  . 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 Carpal tunnel syndrome on right 11/02/2017  . MVP  (mitral valve prolapse) 10/28/2017   Past Medical History:  Diagnosis Date  . Anginal pain (HCC) 02/2019   related to MVP  . Mitral prolapse    Past Surgical History:  Procedure Laterality Date  . CESAREAN SECTION  03/30/2008  . ROBOTIC ASSISTED TOTAL HYSTERECTOMY Bilateral 03/15/2019   Procedure: XI ROBOTIC ASSISTED TOTAL HYSTERECTOMY with Bilateral Salpingectomy;  Surgeon: Silverio Lay, MD;  Location: [facility];  Service: Gynecology;  Laterality: Bilateral;  . TUBAL LIGATION  2017   Allergies  Allergen Reactions  . Morphine Shortness Of Breath  . Morphine And Related Anaphylaxis  . Tessalon [Benzonatate] Hives    Hives, itching, burning all over.   . Hydrocodone Hives  . Latex Rash  . Codeine Nausea And Vomiting  . Tramadol Other (See Comments)    Tingling in the mouth with swelling    Prior to Admission medications   Medication Sig Start Date End Date Taking? Authorizing Provider  aspirin 81 MG chewable tablet Chew 81 mg by mouth daily.   Yes [provider]  Multiple Vitamin (MULTI-VITAMIN DAILY PO) Take by mouth daily.   Yes [provider]   Social History   Socioeconomic History  . Marital status: Single    Spouse name: Not on file  . Number of children: Not on file  . Years of education: Not on file  . Highest education level: Not on file  Occupational History  . Not on file  Tobacco Use  . Smoking status: Never Smoker  . Smokeless tobacco: Never Used  Vaping Use  . Vaping Use: Never used  Substance and Sexual Activity  . Alcohol use: Not Currently    Comment:    . Drug use: No  . Sexual activity: Never  Other Topics Concern  . Not on file  Social History Narrative  . Not on file   Social Determinants of Health   Financial Resource Strain: Not on file  Food Insecurity: Not on file  Transportation Needs: Not on file  Physical Activity: Not on file  Stress: Not on file  Social Connections: Not on file   Intimate Partner Violence: Not on file     Observations/Objective: Vitals:   12/05/20 1559  Weight: 150 lb (68 kg)  Height: 5\' 1"  (1.549 m)  speaking in full sentences.  No distress, normal respiratory effort over phone.  All questions answered with understanding of plan expressed.   Assessment and Plan: Cough - Plan: DISCONTINUED: azithromycin (ZITHROMAX) 250 MG tablet  LRTI (lower respiratory tract infection) - Plan: DISCONTINUED: azithromycin (ZITHROMAX) 250 MG tablet Persistent cough, possible secondary bronchitis/lower respiratory tract infection.  Start azithromycin.  RTC/urgent care precautions if not improving or worsening symptoms.  Follow Up Instructions: As needed.  ER/urgent care precautions.   I discussed the assessment and treatment plan with the patient. The patient was provided an opportunity to ask questions and all were answered. The patient agreed with the plan and demonstrated an understanding of the instructions.   The patient was advised to call back or seek an in-person evaluation if the symptoms worsen or if the condition fails to improve as anticipated.  I provided 12 minutes of non-face-to-face time during this encounter.  Signed,   , MD Primary Care at [facility].  12/07/20

## 2020-12-09 NOTE — Telephone Encounter (Signed)
Called pt to check how medication is working no answer lm asked her to call back if any issues

## 2020-12-09 NOTE — Telephone Encounter (Signed)
Prescription for Zithromax was sent on 12/05/2020.  Please follow-up on this.  Thanks.

## 2020-12-09 NOTE — Telephone Encounter (Signed)
Pt requesting abx following appt 12/03/2020

## 2021-02-06 ENCOUNTER — Other Ambulatory Visit: Payer: Self-pay

## 2021-02-06 ENCOUNTER — Other Ambulatory Visit: Payer: Self-pay | Admitting: Family Medicine

## 2021-02-06 ENCOUNTER — Ambulatory Visit: Payer: Self-pay

## 2021-02-06 DIAGNOSIS — M546 Pain in thoracic spine: Secondary | ICD-10-CM

## 2021-02-11 ENCOUNTER — Emergency Department
Admission: RE | Admit: 2021-02-11 | Discharge: 2021-02-11 | Disposition: A | Discharge status: Home / Self Care | Payer: BC Managed Care – PPO | Source: Ambulatory Visit | Attending: Family Medicine | Admitting: Family Medicine

## 2021-02-11 ENCOUNTER — Other Ambulatory Visit: Payer: Self-pay

## 2021-02-11 VITALS — BP 123/80 | HR 74 | Temp 98.6°F | Resp 18

## 2021-02-11 DIAGNOSIS — N3001 Acute cystitis with hematuria: Secondary | ICD-10-CM | POA: Diagnosis not present

## 2021-02-11 LAB — POCT URINALYSIS DIP (MANUAL ENTRY)
Bilirubin, UA: NEGATIVE
Glucose, UA: NEGATIVE mg/dL
Ketones, POC UA: NEGATIVE mg/dL
Leukocytes, UA: NEGATIVE
Nitrite, UA: NEGATIVE
Protein Ur, POC: NEGATIVE mg/dL
Spec Grav, UA: 1.02 (ref 1.010–1.025)
Urobilinogen, UA: 0.2 E.U./dL
pH, UA: 6 (ref 5.0–8.0)

## 2021-02-11 MED ORDER — NAPROXEN SODIUM 550 MG PO TABS: 550.0000 mg | ORAL_TABLET | Freq: Two times a day (BID) | ORAL | 0 refills | Status: DC

## 2021-02-11 MED ORDER — TIZANIDINE HCL 4 MG PO TABS: 4.0000 mg | ORAL_TABLET | Freq: Four times a day (QID) | ORAL | 0 refills | Status: DC | PRN

## 2021-02-11 MED ORDER — NAPROXEN SODIUM 550 MG PO TABS: 550.0000 mg | ORAL_TABLET | Freq: Two times a day (BID) | ORAL | Status: DC

## 2021-02-11 MED ORDER — CIPROFLOXACIN HCL 250 MG PO TABS: 250.0000 mg | ORAL_TABLET | Freq: Two times a day (BID) | ORAL | 0 refills | Status: DC

## 2021-02-11 NOTE — ED Triage Notes (Signed)
Pt c/o RT lower back pain x 3-4 days. Was sick with some vomiting last thurs thru Saturday which has since resolved. Denies injury. Tylenol and motrin prn.  Does also mention shes has an increase in urination as well as darker colored urine.

## 2021-02-11 NOTE — ED Provider Notes (Signed)
[facility]    CSN: [id number] Arrival date & time: 02/11/21  1643      History   Chief Complaint Chief Complaint  Patient presents with  . Back Pain    RT side    HPI Suzanne Tucker is a 40 y.o. female.   HPI  Flank pain, vomiting, hematuria. Urinary frequency for a few days with the right low back pain remaining and other sx wax and wane.  No fever or chills.  No pregnancy or vaginal discharge.  Has had hysterectomy.  Has no ttaken any medication   Past Medical History:  Diagnosis Date  . Anginal pain (HCC) 02/2019   related to MVP  . Mitral prolapse     Patient Active Problem List   Diagnosis Date Noted  . 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 Carpal tunnel syndrome on right 11/02/2017  . MVP (mitral valve prolapse) 10/28/2017    Past Surgical History:  Procedure Laterality Date  . CESAREAN SECTION  03/30/2008  . ROBOTIC ASSISTED TOTAL HYSTERECTOMY Bilateral 03/15/2019   Procedure: XI ROBOTIC ASSISTED TOTAL HYSTERECTOMY with Bilateral Salpingectomy;  Surgeon: Silverio Lay, MD;  Location: [facility];  Service: Gynecology;  Laterality: Bilateral;  . TUBAL LIGATION  2017    OB History   No obstetric history on file.      Home Medications    Prior to Admission medications   Medication Sig Start Date End Date Taking? Authorizing Provider  ciprofloxacin (CIPRO) 250 MG tablet Take 1 tablet (250 mg total) by mouth every 12 (twelve) hours. 02/11/21  Yes Eustace Moore, MD  tiZANidine (ZANAFLEX) 4 MG tablet Take 1-2 tablets (4-8 mg total) by mouth every 6 (six) hours as needed for muscle spasms. 02/11/21  Yes Eustace Moore, MD  aspirin 81 MG chewable tablet Chew 81 mg by mouth daily.    [provider]  fluticasone (FLONASE) 50 MCG/ACT nasal spray Place 2 sprays into both nostrils daily. 12/21/20   [provider]  Multiple Vitamin (MULTI-VITAMIN DAILY PO) Take by mouth daily.    [provider]   naproxen sodium (ANAPROX DS) 550 MG tablet Take 1 tablet (550 mg total) by mouth 2 (two) times daily with a meal. 02/11/21   Eustace Moore, MD    Family History Family History  Problem Relation Age of Onset  . Healthy Mother   . Alzheimer's disease Father   . Diabetes Maternal Grandmother   . Heart disease Maternal Grandmother   . Heart attack Maternal Grandmother   . Diabetes Maternal Grandfather   . Heart disease Maternal Grandfather   . Alzheimer's disease Paternal Grandmother   . Dementia Paternal Grandmother   . Alzheimer's disease Paternal Grandfather   . Dementia Paternal Grandfather   . Alcohol abuse Paternal Grandfather     Social History Social History   Tobacco Use  . Smoking status: Never Smoker  . Smokeless tobacco: Never Used  Vaping Use  . Vaping Use: Never used  Substance Use Topics  . Alcohol use: Not Currently    Comment:    . Drug use: No     Allergies   Morphine, Morphine and related, Tessalon [benzonatate], Hydrocodone, Latex, Codeine, and Tramadol   Review of Systems Review of Systems See HPI  Physical Exam Triage Vital Signs ED Triage Vitals  Enc Vitals Group     BP 02/11/21 1653 123/80     Pulse Rate 02/11/21 1653 74     Resp 02/11/21 1653 18  Temp 02/11/21 1653 98.6 F (37 C)     Temp Source 02/11/21 1653 Oral     SpO2 02/11/21 1653 97 %     Weight --      Height --      Head Circumference --      Peak Flow --      Pain Score 02/11/21 1656 7     Pain Loc --      Pain Edu? --      Excl. in GC? --    No data found.  Updated Vital Signs BP 123/80 (BP Location: Right Arm)   Pulse 74   Temp 98.6 F (37 C) (Oral)   Resp 18   SpO2 97%       Physical Exam Constitutional:      General: She is not in acute distress.    Appearance: She is well-developed. She is ill-appearing.  HENT:     Head: Normocephalic and atraumatic.  Eyes:     Conjunctiva/sclera: Conjunctivae normal.     Pupils: Pupils are equal, round,  and reactive to light.  Cardiovascular:     Rate and Rhythm: Normal rate.  Pulmonary:     Effort: Pulmonary effort is normal. No respiratory distress.  Abdominal:     General: There is no distension.     Palpations: Abdomen is soft.     Tenderness: There is abdominal tenderness. There is right CVA tenderness.     Comments: Tender to palpation right mid abdomen  Musculoskeletal:        General: Normal range of motion.     Cervical back: Normal range of motion.  Skin:    General: Skin is warm and dry.  Neurological:     General: No focal deficit present.     Mental Status: She is alert.  Psychiatric:        Behavior: Behavior normal.      UC Treatments / Results  Labs (all labs ordered are listed, but only abnormal results are displayed) Labs Reviewed  POCT URINALYSIS DIP (MANUAL ENTRY) - Abnormal; Notable for the following components:      Result Value   Blood, UA trace-intact (*)    All other components within normal limits  URINE CULTURE    EKG   Radiology No results found.  Procedures Procedures (including critical care time)  Medications Ordered in UC Medications - No data to display  Initial Impression / Assessment and Plan / UC Course  I have reviewed the triage vital signs and the nursing notes.  Pertinent labs & imaging results that were available during my care of the patient were reviewed by me and considered in my medical decision making (see chart for details).     Urine culture pending Final Clinical Impressions(s) / UC Diagnoses   Final diagnoses:  Acute cystitis with hematuria     Discharge Instructions     Take Naprosyn 2 times a day for the back pain Take this with food Take tizanidine as needed as muscle relaxer.  This is useful at bedtime Take the antibiotic 2 times a day for a week Make sure you are drinking plenty of fluids Your test results will be available in a couple of days on MyChart.  If your culture is negative you may  stop the antibiotics early Follow-up with your primary care doctor    ED Prescriptions    Medication Sig Dispense Auth. Provider   ciprofloxacin (CIPRO) 250 MG tablet Take 1 tablet (250 mg  total) by mouth every 12 (twelve) hours. 14 tablet Eustace Moore, MD   naproxen sodium (ANAPROX DS) 550 MG tablet  (Status: Discontinued) Take 1 tablet (550 mg total) by mouth 2 (two) times daily with a meal. 30 tablet Eustace Moore, MD   tiZANidine (ZANAFLEX) 4 MG tablet Take 1-2 tablets (4-8 mg total) by mouth every 6 (six) hours as needed for muscle spasms. 21 tablet Eustace Moore, MD   naproxen sodium (ANAPROX DS) 550 MG tablet Take 1 tablet (550 mg total) by mouth 2 (two) times daily with a meal. 30 tablet Eustace Moore, MD     PDMP not reviewed this encounter.   Eustace Moore, MD 02/11/21 [phone]

## 2021-02-11 NOTE — Discharge Instructions (Addendum)
Take Naprosyn 2 times a day for the back pain Take this with food Take tizanidine as needed as muscle relaxer.  This is useful at bedtime Take the antibiotic 2 times a day for a week Make sure you are drinking plenty of fluids Your test results will be available in a couple of days on MyChart.  If your culture is negative you may stop the antibiotics early Follow-up with your primary care doctor

## 2021-02-12 LAB — URINE CULTURE
MICRO NUMBER:: 11880882
Result:: NO GROWTH
SPECIMEN QUALITY:: ADEQUATE

## 2021-02-16 ENCOUNTER — Encounter: Payer: Self-pay | Admitting: Internal Medicine

## 2021-02-16 ENCOUNTER — Ambulatory Visit (HOSPITAL_COMMUNITY)
Admission: RE | Admit: 2021-02-16 | Discharge: 2021-02-16 | Disposition: A | Discharge status: Home / Self Care | Payer: BC Managed Care – PPO | Source: Ambulatory Visit | Attending: Internal Medicine | Admitting: Internal Medicine

## 2021-02-16 ENCOUNTER — Other Ambulatory Visit: Payer: Self-pay

## 2021-02-16 ENCOUNTER — Ambulatory Visit: Payer: BC Managed Care – PPO | Admitting: Internal Medicine

## 2021-02-16 VITALS — BP 118/78 | HR 82 | Temp 98.7°F | Ht 61.0 in | Wt 152.2 lb

## 2021-02-16 DIAGNOSIS — R1031 Right lower quadrant pain: Secondary | ICD-10-CM | POA: Diagnosis present

## 2021-02-16 DIAGNOSIS — R109 Unspecified abdominal pain: Secondary | ICD-10-CM | POA: Diagnosis not present

## 2021-02-16 LAB — POCT URINALYSIS DIP (MANUAL ENTRY)
Bilirubin, UA: NEGATIVE
Glucose, UA: NEGATIVE mg/dL
Ketones, POC UA: NEGATIVE mg/dL
Leukocytes, UA: NEGATIVE
Nitrite, UA: NEGATIVE
Protein Ur, POC: NEGATIVE mg/dL
Spec Grav, UA: 1.02 (ref 1.010–1.025)
Urobilinogen, UA: 0.2 E.U./dL
pH, UA: 6 (ref 5.0–8.0)

## 2021-02-16 NOTE — Assessment & Plan Note (Signed)
U/A done in the office which still shows microscopic hematuria. Ordered CT to rule out kidney stone. Treat as appropriate.

## 2021-02-16 NOTE — Patient Instructions (Signed)
We are checking the CT scan for kidney stones today.

## 2021-02-16 NOTE — Progress Notes (Signed)
   Subjective:   Patient ID: Suzanne Tucker, female    DOB: 1981-09-15, 40 y.o.   MRN: [id number]  HPI The patient is a 40 YO female coming in for right flank pain and blood in urine detected at urgent care. The pain started 11 days ago. Has not improved. Sometimes worse than others. Denies fevers or chills. Denies prior history of kidney stones. Has prior hysterectomy and does not feel like ovarian problem. Seen at urgent care and given cipro although urine culture later showed no growth. She was also given naproxen and muscle relaxers. She is very sensitive to medications and did not take muscle relaxer but took a naproxen which made her sleep. Overall not improving and cipro did not help at all.   Review of Systems  Constitutional: Negative.   HENT: Negative.   Eyes: Negative.   Respiratory: Negative for cough, chest tightness and shortness of breath.   Cardiovascular: Negative for chest pain, palpitations and leg swelling.  Gastrointestinal: Negative for abdominal distention, abdominal pain, constipation, diarrhea, nausea and vomiting.  Genitourinary: Positive for flank pain.  Skin: Negative.   Neurological: Negative.   Psychiatric/Behavioral: Negative.     Objective:  Physical Exam Constitutional:      Appearance: She is well-developed.  HENT:     Head: Normocephalic and atraumatic.  Cardiovascular:     Rate and Rhythm: Normal rate and regular rhythm.  Pulmonary:     Effort: Pulmonary effort is normal. No respiratory distress.     Breath sounds: Normal breath sounds. No wheezing or rales.  Abdominal:     General: Bowel sounds are normal. There is no distension.     Palpations: Abdomen is soft.     Tenderness: There is no abdominal tenderness. There is no rebound.  Musculoskeletal:        General: Tenderness present.     Cervical back: Normal range of motion.     Comments: Right flank pain  Skin:    General: Skin is warm and dry.  Neurological:     Mental Status: She is  alert and oriented to person, place, and time.     Coordination: Coordination normal.     Vitals:   02/16/21 1318  BP: 118/78  Pulse: 82  Temp: 98.7 F (37.1 C)  TempSrc: Oral  SpO2: 98%  Weight: 152 lb 3.2 oz (69 kg)  Height: 5\' 1"  (1.549 m)    This visit occurred during the SARS-CoV-2 public health emergency.  Safety protocols were in place, including screening questions prior to the visit, additional usage of staff PPE, and extensive cleaning of exam room while observing appropriate contact time as indicated for disinfecting solutions.   Assessment & Plan:

## 2021-02-17 ENCOUNTER — Encounter: Payer: Self-pay | Admitting: Internal Medicine

## 2021-02-22 ENCOUNTER — Other Ambulatory Visit: Payer: Self-pay

## 2021-02-22 ENCOUNTER — Ambulatory Visit: Payer: Self-pay

## 2021-02-22 ENCOUNTER — Emergency Department
Admission: EM | Admit: 2021-02-22 | Discharge: 2021-02-22 | Disposition: A | Discharge status: Home / Self Care | Payer: BC Managed Care – PPO | Source: Home / Self Care

## 2021-02-22 ENCOUNTER — Encounter: Payer: Self-pay | Admitting: Internal Medicine

## 2021-02-22 ENCOUNTER — Emergency Department (INDEPENDENT_AMBULATORY_CARE_PROVIDER_SITE_OTHER): Payer: BC Managed Care – PPO

## 2021-02-22 DIAGNOSIS — M5410 Radiculopathy, site unspecified: Secondary | ICD-10-CM | POA: Diagnosis not present

## 2021-02-22 DIAGNOSIS — M545 Low back pain, unspecified: Secondary | ICD-10-CM

## 2021-02-22 HISTORY — DX: Dorsalgia, unspecified: M54.9

## 2021-02-22 NOTE — ED Provider Notes (Signed)
[facility]    CSN: [id number] Arrival date & time: 02/22/21  1047      History   Chief Complaint Chief Complaint  Patient presents with  . Back Pain    Right lower    HPI Suzanne Tucker is a 40 y.o. female.   Patient presents with concerns of right low back/hip pain for the past couple weeks. She reports she came here initially and had a negative UA and urine culture and was seen by her PCP who ordered a CT which she states was negative for any internal organ problems. The patient states the pain is constant but worse with prolonged sitting or laying down when she is sleeping. She states it seemed to be worse this morning and inquires about getting an x-ray. She took a half a naproxen yesterday with minimal improvement and states it made her sleepy. The patient states she was also prescribed other medicine but didn't take it because she doesn't like taking medicine. She denies prior similar. She states she did fall back shortly before Easter but denies any other known injury. The patient denies any radiating pain into the legs, numbness/tingling, or bowel/bladder changes.   The history is provided by the patient.  Back Pain Associated symptoms: no abdominal pain, no fever, no headaches, no numbness and no weakness     Past Medical History:  Diagnosis Date  . Anginal pain (HCC) 02/2019   related to MVP  . Back pain   . Mitral prolapse     Patient Active Problem List   Diagnosis Date Noted  . Right flank pain 02/16/2021  . 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 Carpal tunnel syndrome on right 11/02/2017  . MVP (mitral valve prolapse) 10/28/2017    Past Surgical History:  Procedure Laterality Date  . CESAREAN SECTION  03/30/2008  . ROBOTIC ASSISTED TOTAL HYSTERECTOMY Bilateral 03/15/2019   Procedure: XI ROBOTIC ASSISTED TOTAL HYSTERECTOMY with Bilateral Salpingectomy;  Surgeon: Silverio Lay, MD;  Location: [facility] [place];  Service: Gynecology;   Laterality: Bilateral;  . TUBAL LIGATION  2017    OB History   No obstetric history on file.      Home Medications    Prior to Admission medications   Medication Sig Start Date End Date Taking? Authorizing Provider  aspirin 81 MG chewable tablet Chew 81 mg by mouth daily.    [provider]  ciprofloxacin (CIPRO) 250 MG tablet Take 1 tablet (250 mg total) by mouth every 12 (twelve) hours. 02/11/21   Eustace Moore, MD  fluticasone [facility]) 50 MCG/ACT nasal spray Place 2 sprays into both nostrils daily. 12/21/20   [provider]  Multiple Vitamin (MULTI-VITAMIN DAILY PO) Take by mouth daily.    [provider]  naproxen sodium (ANAPROX DS) 550 MG tablet Take 1 tablet (550 mg total) by mouth 2 (two) times daily with a meal. 02/11/21   Eustace Moore, MD  tiZANidine (ZANAFLEX) 4 MG tablet Take 1-2 tablets (4-8 mg total) by mouth every 6 (six) hours as needed for muscle spasms. Patient not taking: No sig reported 02/11/21   Eustace Moore, MD    Family History Family History  Problem Relation Age of Onset  . Healthy Mother   . Alzheimer's disease Father   . Diabetes Maternal Grandmother   . Heart disease Maternal Grandmother   . Heart attack Maternal Grandmother   . Diabetes Maternal Grandfather   . Heart disease Maternal Grandfather   . Alzheimer's disease Paternal  Grandmother   . Dementia Paternal Grandmother   . Alzheimer's disease Paternal Grandfather   . Dementia Paternal Grandfather   . Alcohol abuse Paternal Grandfather     Social History Social History   Tobacco Use  . Smoking status: Never Smoker  . Smokeless tobacco: Never Used  Vaping Use  . Vaping Use: Never used  Substance Use Topics  . Alcohol use: Not Currently    Comment:    . Drug use: No     Allergies   Morphine, Morphine and related, Tessalon [benzonatate], Hydrocodone, Latex, Codeine, and Tramadol   Review of Systems Review of Systems  Constitutional:  Negative for fatigue and fever.  Gastrointestinal: Negative for abdominal pain.  Genitourinary: Negative for difficulty urinating.  Musculoskeletal: Positive for back pain. Negative for joint swelling.  Skin: Negative for rash and wound.  Neurological: Negative for weakness, numbness and headaches.     Physical Exam Triage Vital Signs ED Triage Vitals  Enc Vitals Group     BP 02/22/21 1119 124/85     Pulse Rate 02/22/21 1119 83     Resp 02/22/21 1119 18     Temp 02/22/21 1119 98.7 F (37.1 C)     Temp Source 02/22/21 1119 Oral     SpO2 02/22/21 1119 100 %     Weight 02/22/21 1115 150 lb (68 kg)     Height 02/22/21 1115 5\' 1"  (1.549 m)     Head Circumference --      Peak Flow --      Pain Score 02/22/21 1115 5     Pain Loc --      Pain Edu? --      Excl. in GC? --    No data found.  Updated Vital Signs BP 124/85 (BP Location: Right Arm)   Pulse 83   Temp 98.7 F (37.1 C) (Oral)   Resp 18   Ht 5\' 1"  (1.549 m)   Wt 150 lb (68 kg)   SpO2 100%   BMI 28.34 kg/m   Visual Acuity Right Eye Distance:   Left Eye Distance:   Bilateral Distance:    Right Eye Near:   Left Eye Near:    Bilateral Near:     Physical Exam Vitals and nursing note reviewed.  Constitutional:      General: She is not in acute distress. HENT:     Head: Normocephalic.  Eyes:     Pupils: Pupils are equal, round, and reactive to light.  Cardiovascular:     Rate and Rhythm: Normal rate and regular rhythm.     Heart sounds: Normal heart sounds.  Pulmonary:     Effort: Pulmonary effort is normal.     Breath sounds: Normal breath sounds.  Musculoskeletal:     Lumbar back: Tenderness present. No spasms. Decreased range of motion. Negative right straight leg raise test and negative left straight leg raise test.     Comments: Right paraspinal lumbar tenderness, extending out along posterior iliac crest to lateral hip. No tenderness down into hip joint (lateral or anterior) or buttocks or sacrum.  Worsening pain with right hip flexion, but minimal discomfort with hip abduction.   Skin:    Findings: No rash.  Neurological:     Mental Status: She is alert.  Psychiatric:        Mood and Affect: Mood normal.      UC Treatments / Results  Labs (all labs ordered are listed, but only abnormal results are displayed) Labs  Reviewed - No data to display  EKG   Radiology DG Lumbar Spine 2-3 Views  Result Date: 02/22/2021 CLINICAL DATA:  Severe RIGHT low back pain radiating to RIGHT hip. EXAM: LUMBAR SPINE - 2-3 VIEW COMPARISON:  02/16/2021 CT FINDINGS: There is no evidence of lumbar spine fracture. Alignment is normal. Intervertebral disc spaces are maintained. Tubal ligation clips high within the LEFT pelvis is noted. IMPRESSION: 1. No lumbar spine abnormalities identified. Electronically Signed   By: Harmon Pier M.D.   On: 02/22/2021 12:33   DG Hip Unilat W or Wo Pelvis 2-3 Views Right  Result Date: 02/22/2021 CLINICAL DATA:  Acute RIGHT hip pain.  Initial encounter. EXAM: DG HIP (WITH OR WITHOUT PELVIS) 2-3V RIGHT COMPARISON:  None. FINDINGS: There is no evidence of hip fracture or dislocation. There is no evidence of arthropathy or other focal bone abnormality. IMPRESSION: Negative. Electronically Signed   By: Harmon Pier M.D.   On: 02/22/2021 12:33    Procedures Procedures (including critical care time)  Medications Ordered in UC Medications - No data to display  Initial Impression / Assessment and Plan / UC Course  I have reviewed the triage vital signs and the nursing notes.  Pertinent labs & imaging results that were available during my care of the patient were reviewed by me and considered in my medical decision making (see chart for details).     No injury, no abnl on x-rays today or recent CT. Suspect MSK cause, likely inflammation at iliac crest insertions. Pt declines another medication to try. F/u with ortho for further management.   E/M: 1 acute uncomplicated  illness, 2 data (2 x-rays), moderate risk due to prescription management discussion (intended to Rx steroids but pt declined, states she will not take medicine)  Final Clinical Impressions(s) / UC Diagnoses   Final diagnoses:  Acute right-sided low back pain without sciatica     Discharge Instructions     Negative x-rays. As discussed, suspect inflammation of the muscles and/or attachments at the top of the hip bone. You declined other medication today. Recommend continuing with naproxen, heat, and gentle stretching/massage. Follow up with PCP and/or ortho for further evaluation.    ED Prescriptions    None     PDMP not reviewed this encounter.   Estanislado Pandy, [place] 02/22/21 [phone]

## 2021-02-22 NOTE — ED Triage Notes (Signed)
Pt presents to Urgent Care with c/o severe R lower back pain this morning. Pt reports she has been having back pain since early May 2022. She had a back injury in October 2021 and another injury in April 2022. She reports she has had multiple UAs and does not have any urinary s/s at this time.

## 2021-02-22 NOTE — Discharge Instructions (Signed)
Negative x-rays. As discussed, suspect inflammation of the muscles and/or attachments at the top of the hip bone. You declined other medication today. Recommend continuing with naproxen, heat, and gentle stretching/massage. Follow up with PCP and/or ortho for further evaluation.

## 2021-03-06 ENCOUNTER — Encounter: Payer: Self-pay | Admitting: Emergency Medicine

## 2021-03-10 NOTE — Telephone Encounter (Signed)
They report she got states the following: If you would like to discuss your test results with a [facility] provider, please call to schedule an appointment at [phone]. I recommend she discuss this with these results with the sleep med provider. Thanks.

## 2021-03-19 NOTE — Telephone Encounter (Signed)
Sent pt mychart message

## 2021-06-27 ENCOUNTER — Other Ambulatory Visit: Payer: Self-pay

## 2021-06-27 ENCOUNTER — Emergency Department
Admission: RE | Admit: 2021-06-27 | Discharge: 2021-06-27 | Disposition: A | Discharge status: Home / Self Care | Payer: BC Managed Care – PPO | Source: Ambulatory Visit

## 2021-06-27 VITALS — BP 119/84 | HR 93 | Temp 98.7°F | Ht 61.0 in | Wt 152.0 lb

## 2021-06-27 DIAGNOSIS — J309 Allergic rhinitis, unspecified: Secondary | ICD-10-CM

## 2021-06-27 DIAGNOSIS — B349 Viral infection, unspecified: Secondary | ICD-10-CM | POA: Diagnosis not present

## 2021-06-27 DIAGNOSIS — R52 Pain, unspecified: Secondary | ICD-10-CM

## 2021-06-27 DIAGNOSIS — R059 Cough, unspecified: Secondary | ICD-10-CM

## 2021-06-27 MED ORDER — METHYLPREDNISOLONE 4 MG PO TBPK: ORAL_TABLET | ORAL | 0 refills | Status: DC

## 2021-06-27 MED ORDER — FEXOFENADINE HCL 180 MG PO TABS: 180.0000 mg | ORAL_TABLET | Freq: Every day | ORAL | 0 refills | Status: DC

## 2021-06-27 NOTE — ED Provider Notes (Signed)
[facility]    CSN: [id number] Arrival date & time: 06/27/21  1128      History   Chief Complaint Chief Complaint  Patient presents with   Fever    Pt states that she has a fever, nausea, nasal congestion, and headache. Fever x1 day and other symptoms x5 days    HPI Suzanne Tucker is a 40 y.o. female.   HPI 40 year old female presents with fever, nausea, headache, nasal congestion.  Patient is unvaccinated for COVID-19.  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      Home Medications    Prior to Admission medications   Medication Sig Start Date End Date Taking? Authorizing Provider  aspirin 81 MG chewable tablet Chew 81 mg by mouth daily.   Yes [provider]  fexofenadine (ALLEGRA ALLERGY) 180 MG tablet Take 1 tablet (180 mg total) by mouth daily for 15 days. 06/27/21 07/12/21 Yes Trevor Iha, FNP  fluticasone (FLONASE) 50 MCG/ACT nasal spray Place 2 sprays into both nostrils daily. 12/21/20  Yes [provider]  methylPREDNISolone (MEDROL DOSEPAK) 4 MG TBPK tablet Take as directed. 06/27/21  Yes Trevor Iha, FNP  Multiple Vitamin (MULTI-VITAMIN DAILY PO) Take by mouth daily.   Yes [provider]  ciprofloxacin (CIPRO) 250 MG tablet Take 1  tablet (250 mg total) by mouth every 12 (twelve) hours. 02/11/21   Eustace Moore, MD  naproxen sodium (ANAPROX DS) 550 MG tablet Take 1 tablet (550 mg total) by mouth 2 (two) times daily with a meal. 02/11/21   Eustace Moore, MD  tiZANidine (ZANAFLEX) 4 MG tablet Take 1-2 tablets (4-8 mg total) by mouth every 6 (six) hours as needed for muscle spasms. Patient not taking: No sig reported 02/11/21   Eustace Moore, MD    Family History Family History  Problem Relation Age of Onset   Healthy Mother    Alzheimer's disease Father    Diabetes Maternal Grandmother    Heart disease Maternal Grandmother    Heart attack Maternal Grandmother    Diabetes Maternal Grandfather    Heart disease Maternal Grandfather    Alzheimer's disease Paternal Grandmother    Dementia Paternal Grandmother    Alzheimer's disease Paternal Grandfather    Dementia Paternal Grandfather    Alcohol abuse Paternal Grandfather     Social History Social History   Tobacco Use   Smoking status: Never   Smokeless tobacco: Never  Vaping Use   Vaping Use: Never used  Substance Use Topics   Alcohol use: Not Currently    Comment:     Drug use: No     Allergies   Morphine, Morphine and related, Tessalon [benzonatate], Hydrocodone, Latex, Codeine, and Tramadol   Review of Systems Review  of Systems  HENT:  Positive for congestion and postnasal drip.   Respiratory:  Positive for cough.   Gastrointestinal:  Positive for nausea.  Musculoskeletal:  Positive for myalgias.  Neurological:  Positive for headaches.  All other systems reviewed and are negative.   Physical Exam Triage Vital Signs ED Triage Vitals  Enc Vitals Group     BP 06/27/21 1200 119/84     Pulse Rate 06/27/21 1200 93     Resp --      Temp 06/27/21 1200 98.7 F (37.1 C)     Temp Source 06/27/21 1200 Oral     SpO2 06/27/21 1200 98 %     Weight 06/27/21 1158 152 lb (68.9 kg)     Height 06/27/21 1158 5\' 1"  (1.549 m)     Head  Circumference --      Peak Flow --      Pain Score 06/27/21 1157 4     Pain Loc --      Pain Edu? --      Excl. in GC? --    No data found.  Updated Vital Signs BP 119/84 (BP Location: Left Arm)   Pulse 93   Temp 98.7 F (37.1 C) (Oral)   Ht 5\' 1"  (1.549 m)   Wt 152 lb (68.9 kg)   SpO2 98%   BMI 28.72 kg/m    Physical Exam Vitals and nursing note reviewed.  Constitutional:      General: She is not in acute distress.    Appearance: Normal appearance. She is normal weight. She is not ill-appearing.  HENT:     Head: Normocephalic and atraumatic.     Right Ear: Tympanic membrane, ear canal and external ear normal.     Left Ear: Tympanic membrane, ear canal and external ear normal.     Mouth/Throat:     Mouth: Mucous membranes are moist.     Pharynx: Oropharynx is clear.     Comments: Moderate amount of clear drainage of posterior oropharynx noted Eyes:     Extraocular Movements: Extraocular movements intact.     Conjunctiva/sclera: Conjunctivae normal.     Pupils: Pupils are equal, round, and reactive to light.  Cardiovascular:     Rate and Rhythm: Normal rate and regular rhythm.     Pulses: Normal pulses.     Heart sounds: Normal heart sounds. No murmur heard. Pulmonary:     Effort: Pulmonary effort is normal.     Breath sounds: Normal breath sounds. No wheezing, rhonchi or rales.     Comments: Infrequent nonproductive cough noted on exam Musculoskeletal:        General: Normal range of motion.     Cervical back: Normal range of motion and neck supple. No tenderness.  Lymphadenopathy:     Cervical: No cervical adenopathy.  Skin:    General: Skin is warm and dry.  Neurological:     General: No focal deficit present.     Mental Status: She is alert and oriented to person, place, and time. Mental status is at baseline.  Psychiatric:        Mood and Affect: Mood normal.        Behavior: Behavior normal.        Thought Content: Thought content normal.     UC  Treatments / Results  Labs (all labs ordered are listed, but only abnormal results are displayed) Labs Reviewed  COVID-19, FLU A+B NAA    EKG   Radiology No results  found.  Procedures Procedures (including critical care time)  Medications Ordered in UC Medications - No data to display  Initial Impression / Assessment and Plan / UC Course  I have reviewed the triage vital signs and the nursing notes.  Pertinent labs & imaging results that were available during my care of the patient were reviewed by me and considered in my medical decision making (see chart for details).     MDM: 1.  Generalized body aches-COVID-19 PCR flu AB ordered; 2.  Cough-Rx'd Medrol Dosepak; 3. Allergic rhinitis-Rx'd Allegra. Advised patient to take medication as directed with food to completion.  Advised patient to take Allegra daily for the next 7 days for concurrent postnasal drip/drainage, then as needed. Encouraged patient to increase daily water intake while taking these medications and that we would follow-up with her once lab results return.  Discharged home, hemodynamically stable. Final Clinical Impressions(s) / UC Diagnoses   Final diagnoses:  Generalized body aches  Cough  Allergic rhinitis, unspecified seasonality, unspecified trigger     Discharge Instructions      Advised patient to take medication as directed with food to completion.  Advised patient to take Allegra daily for the next 7 days for concurrent postnasal drip/drainage, then as needed encourage patient to increase daily water intake while taking these medications and that we would follow-up with her once lab results return.     ED Prescriptions     Medication Sig Dispense Auth. Provider   methylPREDNISolone (MEDROL DOSEPAK) 4 MG TBPK tablet Take as directed. 1 each Trevor Iha, FNP   fexofenadine [facility] ALLERGY) 180 MG tablet Take 1 tablet (180 mg total) by mouth daily for 15 days. 15 tablet Trevor Iha, FNP       PDMP not reviewed this encounter.   Trevor Iha, FNP 06/27/21 1242

## 2021-06-27 NOTE — ED Triage Notes (Signed)
Pt states that she has a fever, nausea, headache and nasal congestion. Pt states that she had the fever x1day other symptoms x5 days. Pt states that she is not vaccinated.

## 2021-06-27 NOTE — Discharge Instructions (Addendum)
Advised patient to take medication as directed with food to completion.  Advised patient to take Allegra daily for the next 7 days for concurrent postnasal drip/drainage, then as needed. Encouraged patient to increase daily water intake while taking these medications and that we would follow-up with her once lab results return.

## 2021-06-28 ENCOUNTER — Telehealth: Payer: Self-pay

## 2021-06-28 LAB — COVID-19, FLU A+B NAA
Influenza A, NAA: NOT DETECTED
Influenza B, NAA: NOT DETECTED
SARS-CoV-2, NAA: NOT DETECTED

## 2021-06-28 NOTE — Telephone Encounter (Signed)
Pt called to see if she could be prescribed an antibiotic. Reports being seen by M. Ragan, FNP here yesterday and tested negative for flu and COVID. Reports she is still running temp of 101. Information relayed to M. Ragan, FNP, and he states that he will not prescribe an antibiotic at this time. Informed pt and advised to monitor and f/u in a few days if still febrile.

## 2021-07-01 ENCOUNTER — Telehealth: Payer: Self-pay

## 2021-07-01 NOTE — Telephone Encounter (Signed)
Pt called requesting antibiotics due to continued sinus issues and fever. Per Casimiro Needle, spiking fever indicative of viral syndrome. Antibiotics not indicted. Continue fluids, Mucinex and tylenol prn. Let run its course. F/u with PCP if not netter next week.

## 2021-07-02 ENCOUNTER — Telehealth: Payer: BC Managed Care – PPO | Admitting: Family Medicine

## 2021-07-02 DIAGNOSIS — R059 Cough, unspecified: Secondary | ICD-10-CM

## 2021-07-02 DIAGNOSIS — R0981 Nasal congestion: Secondary | ICD-10-CM | POA: Diagnosis not present

## 2021-07-02 MED ORDER — AMOXICILLIN-POT CLAVULANATE 875-125 MG PO TABS: 1.0000 | ORAL_TABLET | Freq: Two times a day (BID) | ORAL | 0 refills | Status: DC

## 2021-07-02 NOTE — Progress Notes (Signed)
Virtual Visit via Video Note  I connected with Suzanne Tucker  on 07/02/21 at 12:40 PM EDT by a video enabled telemedicine application and verified that I am speaking with the correct person using two identifiers.  Location patient: home, [place] Location provider:work or home office Persons participating in the virtual visit: patient, provider  I discussed the limitations of evaluation and management by telemedicine and the availability of in person appointments. The patient expressed understanding and agreed to proceed.   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    Hydrocodone Hives   Latex Rash   Codeine Nausea And Vomiting   Tramadol Other (See Comments)    Tingling in the mouth with swelling   -COVID-19 vaccine status: vaccinated x1 and had covid  ROS: See pertinent positives and negatives per HPI.  Past Medical History:  Diagnosis Date   Anginal pain (HCC) 02/2019   related to MVP   Back pain    Mitral prolapse     Past Surgical History:  Procedure Laterality Date   CESAREAN SECTION  03/30/2008   ROBOTIC ASSISTED TOTAL HYSTERECTOMY Bilateral 03/15/2019   Procedure: XI ROBOTIC ASSISTED TOTAL HYSTERECTOMY with Bilateral  Salpingectomy;  Surgeon: Silverio Lay, MD;  Location: [facility];  Service: Gynecology;  Laterality: Bilateral;   TUBAL LIGATION  2017     Current Outpatient Medications:    amoxicillin-clavulanate (AUGMENTIN) 875-125 MG tablet, Take 1 tablet by mouth 2 (two) times daily., Disp: 20 tablet, Rfl: 0   aspirin 81 MG chewable tablet, Chew 81 mg by mouth daily., Disp: , Rfl:    ciprofloxacin (CIPRO) 250 MG tablet, Take 1 tablet (250 mg total) by mouth every 12 (twelve) hours., Disp: 14 tablet, Rfl: 0   fexofenadine (ALLEGRA ALLERGY) 180 MG tablet, Take 1 tablet (180 mg total) by mouth daily for 15 days., Disp: 15 tablet, Rfl: 0   fluticasone (FLONASE) 50 MCG/ACT nasal spray, Place 2 sprays into both nostrils daily., Disp: , Rfl:    methylPREDNISolone (MEDROL DOSEPAK) 4 MG TBPK tablet, Take as directed., Disp: 1 each, Rfl: 0   Multiple Vitamin (MULTI-VITAMIN DAILY PO), Take by mouth daily., Disp: , Rfl:    naproxen sodium (ANAPROX DS) 550 MG tablet, Take 1 tablet (550 mg total) by mouth 2 (two) times daily with a meal., Disp: 30 tablet, Rfl: 0   tiZANidine (ZANAFLEX) 4 MG tablet, Take 1-2 tablets (4-8 mg total) by mouth every 6 (six) hours as needed for muscle spasms. (Patient not taking: No sig reported), Disp: 21 tablet, Rfl: 0  EXAM:  VITALS per patient if applicable:  GENERAL: alert, oriented, appears well and in no acute distress  HEENT: atraumatic, conjunttiva clear, no obvious abnormalities on inspection of external nose and ears  NECK: normal movements of  the head and neck  LUNGS: on inspection no signs of respiratory distress, breathing rate appears normal, no obvious gross SOB, gasping or wheezing  CV: no obvious cyanosis  MS: moves all visible extremities without noticeable abnormality  PSYCH/NEURO: pleasant and cooperative, no obvious depression or anxiety, speech and thought processing grossly intact  ASSESSMENT AND PLAN:  Discussed the following assessment  and plan:  Nasal congestion  Cough  -we discussed possible serious and likely etiologies, options for evaluation and workup, limitations of telemedicine visit vs in person visit, treatment, treatment risks and precautions. Pt is agreeable to treatment via telemedicine at this moment. Discussed this is likely viral/post viral symptoms at this point since fevers resolved and mucus turning clear. Discussed symptomatic care with nasal saline, short course nasal decongestant and analgesics. She declined cough medication as feels improving. She did want to have an abx on hand as feels may be developing sinusitis. Discussed risks/proper indications and typical course of viral vs bacterial sinus infections. Rx sent for delayed use per her request if worsening or not improving as expected.  Advised to seek prompt in person care if worsening, new symptoms arise, or if is not improving with treatment. Discussed options for inperson care if PCP office not available. Did let this patient know that I only do telemedicine on Tuesdays and Thursdays for [place]. Advised to schedule follow up visit with PCP or UCC if any further questions or concerns to avoid delays in care.   I discussed the assessment and treatment plan with the patient. The patient was provided an opportunity to ask questions and all were answered. The patient agreed with the plan and demonstrated an understanding of the instructions.     Terressa Koyanagi, DO

## 2021-07-02 NOTE — Patient Instructions (Signed)
-  nasal saline twice daily  -can try afrin nasal spray for 3 days  -drink plenty of water, get plenty of rest and avoid dairy products while sick  -I sent the medication(s) we discussed to your pharmacy: Meds ordered this encounter  Medications   amoxicillin-clavulanate (AUGMENTIN) 875-125 MG tablet    Sig: Take 1 tablet by mouth 2 (two) times daily.    Dispense:  20 tablet    Refill:  0     I hope you are feeling better soon!  Seek in person care promptly if your symptoms worsen, new concerns arise or you are not improving with treatment.  It was nice to meet you today. I help [place] out with telemedicine visits on Tuesdays and Thursdays and am available for visits on those days. If you have any concerns or questions following this visit please schedule a follow up visit with your Primary Care doctor or seek care at a local urgent care clinic to avoid delays in care.

## 2021-08-04 NOTE — Progress Notes (Signed)
Cardiology Office Note   Date:  08/05/2021   ID:  Nataya, Bastedo October 22, 1980, MRN [id number]  PCP:  Georgina Quint, MD    No chief complaint on file.  PVCs  Wt Readings from Last 3 Encounters:  08/05/21 156 lb 12.8 oz (71.1 kg)  06/27/21 152 lb (68.9 kg)  02/22/21 150 lb (68 kg)       History of Present Illness: Suzanne Tucker is a 40 y.o. female  With a h/o mitral valve prolapse.   She is a Education officer, museum.   She carries this diagnosis for the past 5-6 years.  She was living in [place], [place]., Dr. Melanie Crazier.  Was not using antibiotics for dental appts.   In her family, her maternal grandmother had pacemaker and valve replacement.  Maternal GF with MI.  Cousin had a valve repair.   2020 echo showed normal LV/RV function.  Normal valvular function as well.   She has had palpitations, but has not worn a monitor.  No other heart tests done.  Sx worse with EtOH and caffeine in the past.    She had GYN surgery in June 2020 successfully.  No heart problems at that time.    Vegan diet.     She got the first dose of Pfizer vaccine in Feb 2021.  She had tongue swelling and so has not had the second shot.  She has been healthy otherwise.   Mild palpitations in [address] with caffeine. Shortlived.    Normal LVEF in 03/2019.    Diagnosed with COVID in 12/21.  Had prolonged fevers which have resolved.  After 2 weeks, she has had more palpitations.  Jan 2022 monitor showed: "Some sx with PVCs which are benign.  No significant abnormalities."  She had some caffeine in 10/22 which was out of the ordinary and developed palpitations, followed by anxiety and then chest pressure.  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  She just had COVID 3 weeks ago with sx for 5 days. She  did not take an antiviral.  Denies : exertional Chest pain. Dizziness. Leg edema. Nitroglycerin use. Orthopnea. Paroxysmal nocturnal dyspnea.   Syncope.    Past Medical History:  Diagnosis Date   Anginal pain (HCC) 02/2019   related to MVP   Back pain    Mitral prolapse     Past Surgical History:  Procedure Laterality Date   CESAREAN SECTION  03/30/2008   ROBOTIC ASSISTED TOTAL HYSTERECTOMY Bilateral 03/15/2019   Procedure: XI ROBOTIC ASSISTED TOTAL HYSTERECTOMY with Bilateral Salpingectomy;  Surgeon: Silverio Lay, MD;  Location: [facility];  Service: Gynecology;  Laterality: Bilateral;   TUBAL LIGATION  2017     Current Outpatient Medications  Medication Sig Dispense Refill   aspirin 81 MG chewable tablet Chew 81 mg by mouth daily.     fexofenadine (ALLEGRA ALLERGY) 180 MG tablet Take 1 tablet (180 mg total) by mouth daily for 15 days. 15 tablet 0   fluticasone (FLONASE) 50 MCG/ACT nasal spray Place 2 sprays into both nostrils daily.     Multiple Vitamin (MULTI-VITAMIN DAILY PO) Take by mouth daily.     No current facility-administered medications for this visit.    Allergies:   Morphine, Morphine and related, Tessalon [benzonatate], Hydrocodone, Latex, Codeine, and Tramadol    Social History:  The patient  reports that she has never smoked. She has never used smokeless tobacco. She reports that she does  not currently use alcohol. She reports that she does not use drugs.   Family History:  The patient's family history includes Alcohol abuse in her paternal grandfather; Alzheimer's disease in her father, paternal grandfather, and paternal grandmother; Dementia in her paternal grandfather and paternal grandmother; Diabetes in her maternal grandfather and maternal grandmother; Healthy in her mother; Heart attack in her maternal grandmother; Heart disease in her maternal grandfather and maternal grandmother.    ROS:  Please see the history of present illness.   Otherwise, review of systems are positive for recent COVID, fever, body aches.   All other systems are reviewed and negative.    PHYSICAL  EXAM: VS:  BP 120/74   Pulse 80   Resp 18   Ht 5\' 1"  (1.549 m)   Wt 156 lb 12.8 oz (71.1 kg)   SpO2 97%   BMI 29.63 kg/m  , BMI Body mass index is 29.63 kg/m. GEN: Well nourished, well developed, in no acute distress HEENT: normal Neck: no JVD, carotid bruits, or masses Cardiac: RRR; no murmurs, rubs, or gallops,no edema  Respiratory:  clear to auscultation bilaterally, normal work of breathing GI: soft, nontender, nondistended, + BS MS: no deformity or atrophy Skin: warm and dry, no rash Neuro:  Strength and sensation are intact Psych: euthymic mood, full affect   EKG:   The ekg ordered in Jan 2022 demonstrates NSR   Recent Labs: 11/04/2020: ALT 12; BUN 8; Creatinine, Ser 0.64; Hemoglobin 12.3; Platelets 306; Potassium 4.4; Sodium 138; TSH 1.350   Lipid Panel No results found for: CHOL, TRIG, HDL, CHOLHDL, VLDL, LDLCALC, LDLDIRECT   Other studies Reviewed: Additional studies/ records that were reviewed today with results demonstrating: labs from 11/2020 reviewed, A1c 5.2.  TSH 1.35   ASSESSMENT AND PLAN:  MVP/SHOB: Symptoms well controlled.  No symptoms of heart failure.  Routine echo every 5 years.  Due in 2025 unless there are new symptoms. Palpitations: PVCs noted on prior monitor. No sustained palpitations or syncope.  Recently increased after COVID but improving now.  Chest pressure: Atypical symptoms.  Occurs in the setting of.  She had no problems hiking and hanging rock recently.  Let 2026 know if she has any exertional symptoms. Memory disturbance: Episode while at school where she could not remember kids names.  REsolved now.  Headaches, fatigue as well in the setting of recent COVID illness.  Will defer to PMD to see if any other tests are needed.  At her next blood draw with PMD, would check fasting lipids.   Current medicines are reviewed at length with the patient today.  The patient concerns regarding her medicines were addressed.  The following changes have  been made:  No change  Labs/ tests ordered today include:  No orders of the defined types were placed in this encounter.   Recommend 150 minutes/week of aerobic exercise Low fat, low carb, high fiber diet recommended  Disposition:   FU in 1 year   Signed, [place], MD  08/05/2021 8:24 AM    [facility] [address] [place], [place], [place]  [place] Phone: [phone]; Fax: [phone]

## 2021-08-05 ENCOUNTER — Telehealth: Payer: Self-pay | Admitting: Emergency Medicine

## 2021-08-05 ENCOUNTER — Ambulatory Visit: Payer: BC Managed Care – PPO | Admitting: Interventional Cardiology

## 2021-08-05 ENCOUNTER — Other Ambulatory Visit: Payer: Self-pay

## 2021-08-05 ENCOUNTER — Encounter: Payer: Self-pay | Admitting: Interventional Cardiology

## 2021-08-05 VITALS — BP 120/74 | HR 80 | Resp 18 | Ht 61.0 in | Wt 156.8 lb

## 2021-08-05 DIAGNOSIS — I493 Ventricular premature depolarization: Secondary | ICD-10-CM | POA: Diagnosis not present

## 2021-08-05 DIAGNOSIS — R0789 Other chest pain: Secondary | ICD-10-CM

## 2021-08-05 DIAGNOSIS — I341 Nonrheumatic mitral (valve) prolapse: Secondary | ICD-10-CM | POA: Diagnosis not present

## 2021-08-05 DIAGNOSIS — R002 Palpitations: Secondary | ICD-10-CM

## 2021-08-05 NOTE — Patient Instructions (Signed)
Medication Instructions:  Your physician recommends that you continue on your current medications as directed. Please refer to the Current Medication list given to you today.  *If you need a refill on your cardiac medications before your next appointment, please call your pharmacy*   Lab Work: None If you have labs (blood work) drawn today and your tests are completely normal, you will receive your results only by: MyChart Message (if you have MyChart) OR A paper copy in the mail If you have any lab test that is abnormal or we need to change your treatment, we will call you to review the results.   Follow-Up: At [facility], you and your health needs are our priority.  As part of our continuing mission to provide you with exceptional heart care, we have created designated Provider Care Teams.  These Care Teams include your primary Cardiologist (physician) and Advanced Practice Providers (APPs -  Physician Assistants and Nurse Practitioners) who all work together to provide you with the care you need, when you need it.   Your next appointment:   1 year(s)  The format for your next appointment:   In Person  Provider:   Jay Varanasi, MD   

## 2021-08-05 NOTE — Telephone Encounter (Signed)
Caller states they were experiencing sharp cheat pains Sunday with arm aches. Took aspirin and pain reduced. Following day and yesterday experiencing confusion. Currently not experiencing chest pain or any breathing problems. GOTO Facility Not Listed [facility] [place] Translation No Nurse Assessment Nurse: Wyn Quaker, RN, Marylene Land Date/Time (Eastern Time): 08/05/2021 9:04:37 AM Confirm and document reason for call. If symptomatic, describe symptoms. ---Caller stated that she has had a headache for past 2 days, confused. She started with chest pains and difficulty breathing on Sunday. She stated that she's normal this morning. She saw a cardiologist just prior to talking to me and was told to see her PCP.  Patient went to ED  Note will also be scanned into Media tab

## 2021-08-05 NOTE — Telephone Encounter (Signed)
Patient states she has headaches and confusion x4d  Patient states there are no other symptoms  Patient transferred to team health

## 2021-08-07 ENCOUNTER — Encounter: Payer: Self-pay | Admitting: Emergency Medicine

## 2021-08-08 NOTE — Telephone Encounter (Signed)
Thanks

## 2021-08-19 ENCOUNTER — Ambulatory Visit: Payer: BC Managed Care – PPO | Admitting: Emergency Medicine

## 2021-08-30 ENCOUNTER — Encounter: Payer: Self-pay | Admitting: Emergency Medicine

## 2021-08-31 ENCOUNTER — Ambulatory Visit: Payer: BC Managed Care – PPO | Admitting: Emergency Medicine

## 2021-09-01 IMAGING — DX DG THORACIC SPINE 3V
3 series · 3 of 3 positions shown · non-contrast
Comparison: Chest radiographs 11/25/2016.

CLINICAL DATA: Back pain since falling more than 6 months ago.

EXAM:
THORACIC SPINE - 3 VIEWS

[t-spine ap]
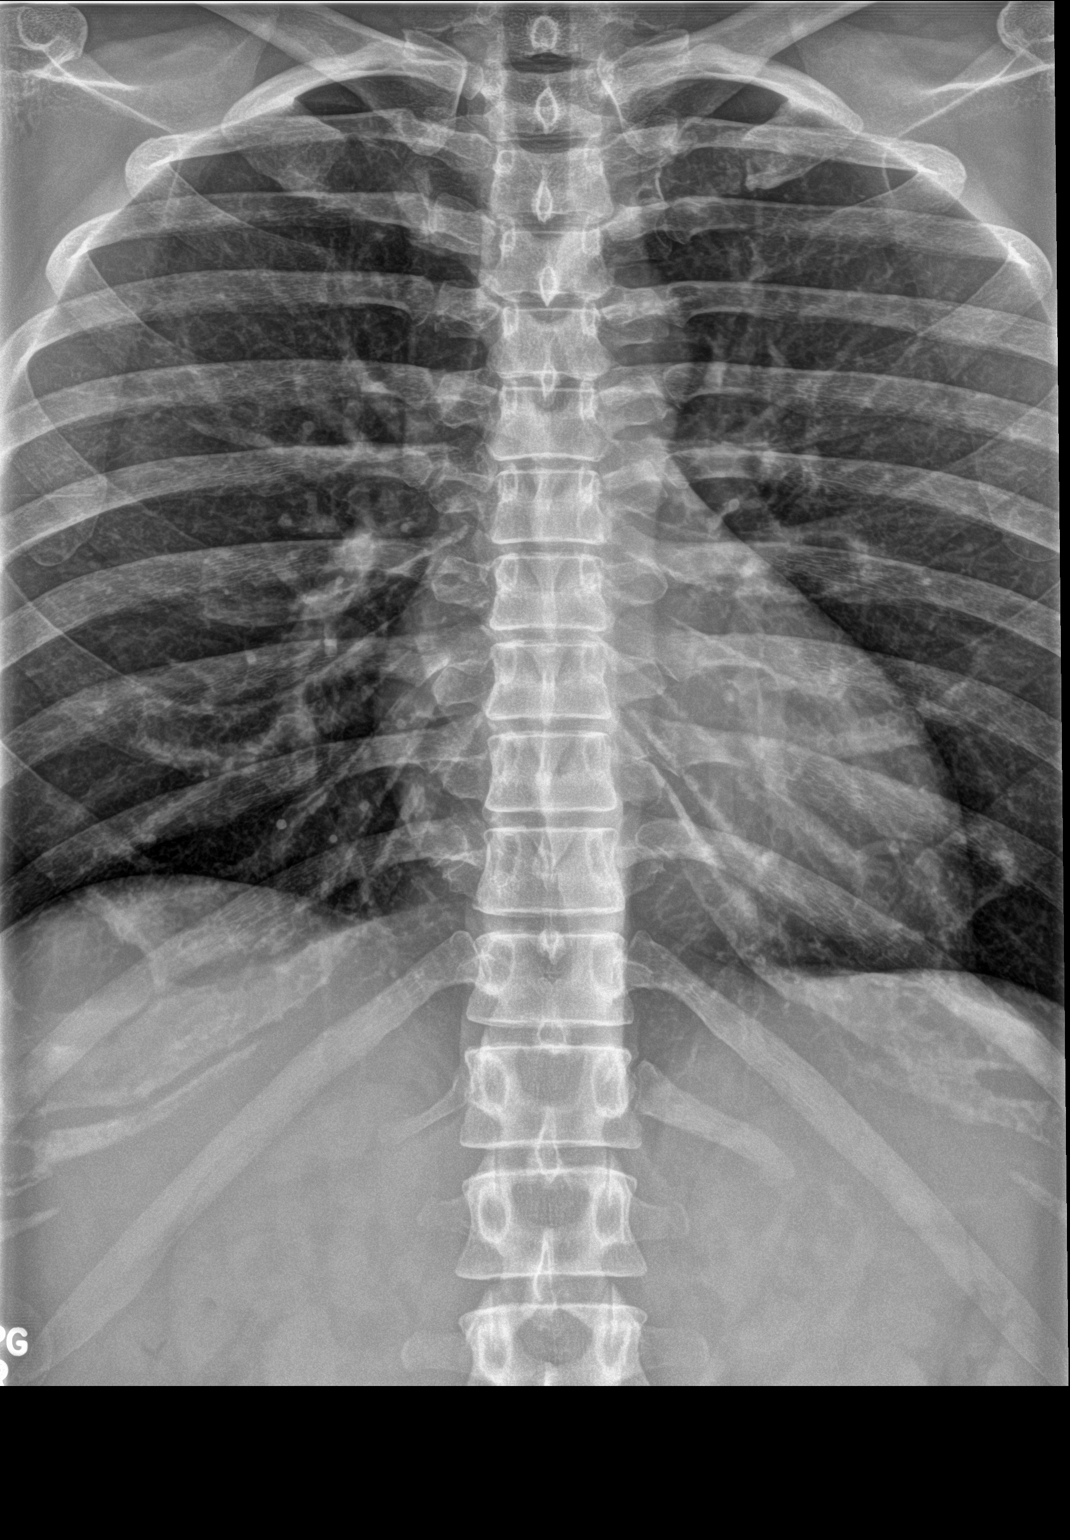

[t-spine lat]
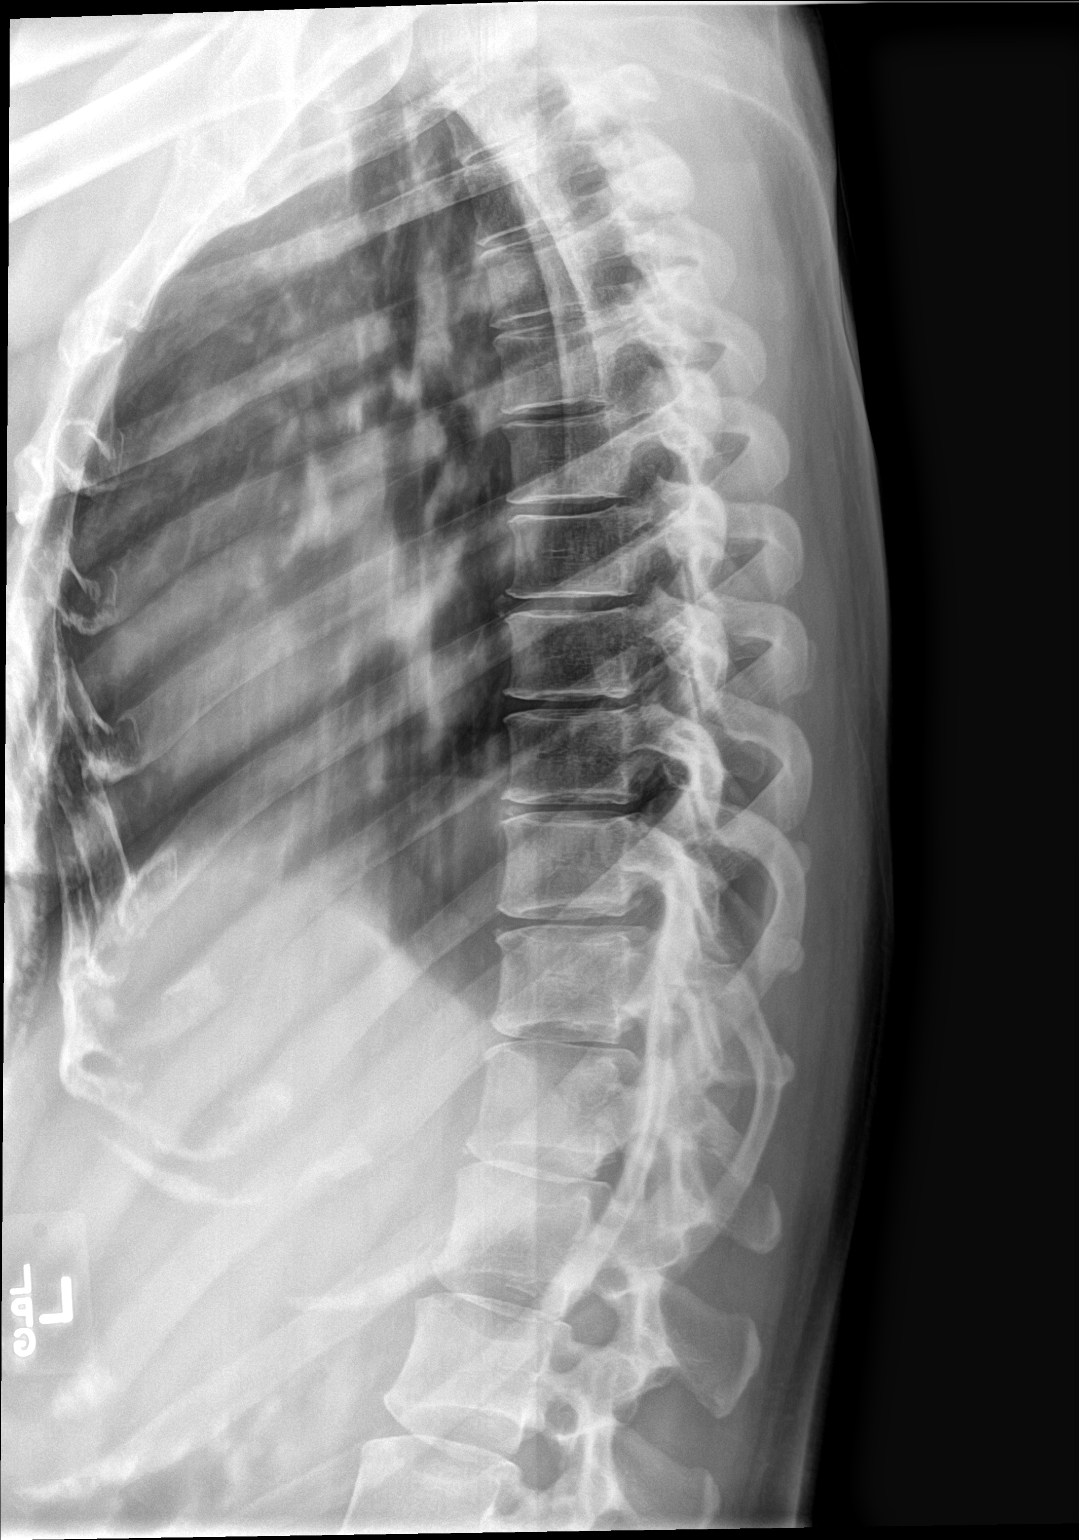

[t-spine swimmers]
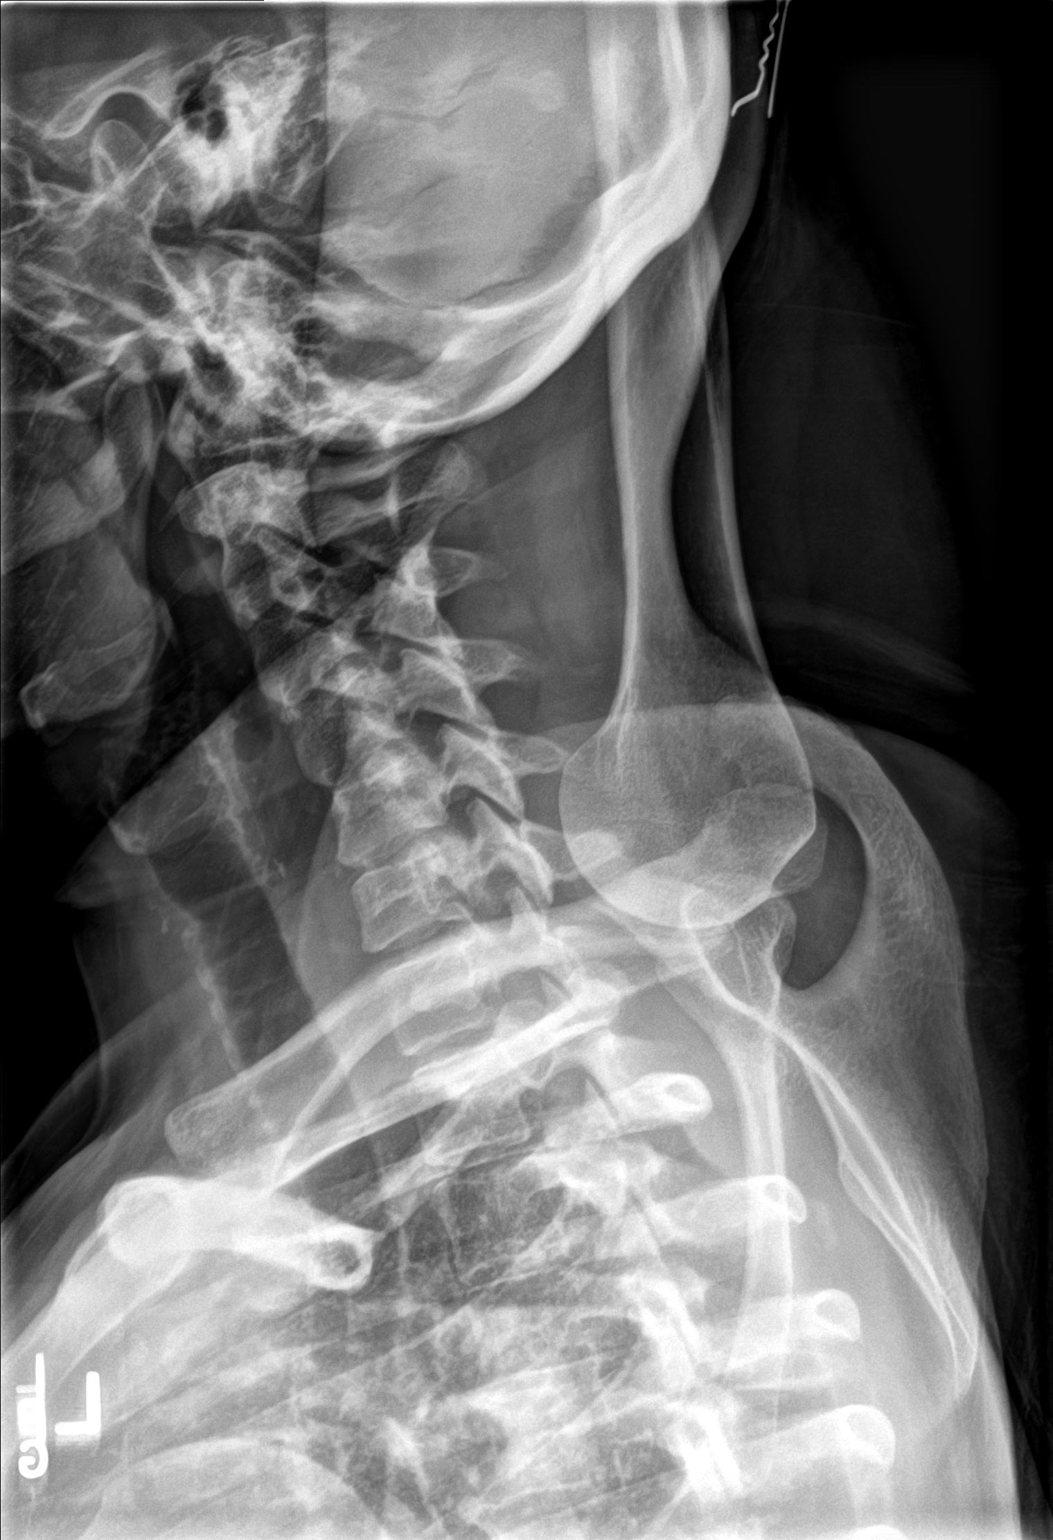

[3 of 3 positions shown; findings below may reference images not displayed]

FINDINGS: There are 12 rib-bearing thoracic type vertebral bodies with small
ribs at T12. The alignment is normal. No evidence of acute fracture,
paraspinal abnormality or widening of the interpedicular distance.
The disc spaces are relatively preserved with mild intervertebral
spurring. No focal soft tissue abnormalities are identified.
IMPRESSION: No evidence of acute thoracic spine injury. Minimal degenerative
changes.

## 2021-10-19 ENCOUNTER — Ambulatory Visit: Payer: BC Managed Care – PPO | Admitting: Orthopaedic Surgery

## 2021-11-17 ENCOUNTER — Telehealth: Payer: Self-pay | Admitting: Family Medicine

## 2021-11-17 ENCOUNTER — Other Ambulatory Visit: Payer: Self-pay

## 2021-11-17 ENCOUNTER — Emergency Department
Admission: RE | Admit: 2021-11-17 | Discharge: 2021-11-17 | Disposition: A | Discharge status: Home / Self Care | Payer: BC Managed Care – PPO | Source: Ambulatory Visit | Attending: Family Medicine | Admitting: Family Medicine

## 2021-11-17 VITALS — BP 120/79 | HR 74 | Temp 98.2°F | Resp 18 | Ht 61.0 in | Wt 151.0 lb

## 2021-11-17 DIAGNOSIS — J069 Acute upper respiratory infection, unspecified: Secondary | ICD-10-CM

## 2021-11-17 MED ORDER — AMOXICILLIN 875 MG PO TABS: 875.0000 mg | ORAL_TABLET | Freq: Two times a day (BID) | ORAL | 0 refills | Status: DC

## 2021-11-17 NOTE — ED Triage Notes (Signed)
Nasal congestion, slight cough, fatigue, sore throat x 2 days. Had Covid twice in January. Had allergic reaction to Vaccination.

## 2021-11-17 NOTE — ED Provider Notes (Signed)
[facility]    CSN: [id number] Arrival date & time: 11/17/21  1452      History   Chief Complaint Chief Complaint  Patient presents with   Nasal Congestion    HPI Suzanne Tucker is a 41 y.o. female.   HPI  Patient had COVID in January.  She was sick for almost 2 weeks.  She states that she got completely better and now she is sick again.  She has runny stuffy nose.  Postnasal drip.  Sinus congestion.  Fatigue.  Slight sore throat.  This has been going on for 2 days.  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      Home Medications    Prior to Admission medications   Medication Sig Start Date End Date Taking? Authorizing Provider  amoxicillin (AMOXIL) 875 MG tablet Take 1 tablet (875 mg total) by mouth 2 (two) times daily. 11/17/21  Yes Eustace Moore, MD  aspirin 81 MG chewable tablet Chew 81 mg by mouth daily.    [provider]  fexofenadine (ALLEGRA ALLERGY) 180 MG tablet Take 1 tablet (180 mg total) by mouth daily for 15 days. 06/27/21 08/05/21  Trevor Iha, FNP  fluticasone (FLONASE) 50 MCG/ACT nasal spray Place 2 sprays into both nostrils daily. 12/21/20   [provider]  Multiple Vitamin (MULTI-VITAMIN DAILY PO) Take by mouth daily.     [provider]    Family History Family History  Problem Relation Age of Onset   Healthy Mother    Alzheimer's disease Father    Diabetes Maternal Grandmother    Heart disease Maternal Grandmother    Heart attack Maternal Grandmother    Diabetes Maternal Grandfather    Heart disease Maternal Grandfather    Alzheimer's disease Paternal Grandmother    Dementia Paternal Grandmother    Alzheimer's disease Paternal Grandfather    Dementia Paternal Grandfather    Alcohol abuse Paternal Grandfather     Social History Social History   Tobacco Use   Smoking status: Never   Smokeless tobacco: Never  Vaping Use   Vaping Use: Never used  Substance Use Topics   Alcohol use: Not Currently    Comment:     Drug use: No     Allergies   Morphine, Morphine and related, Tessalon [benzonatate], Hydrocodone, Latex, Codeine, and Tramadol   Review of Systems Review of Systems See HPI  Physical Exam Triage Vital Signs ED Triage Vitals  Enc Vitals Group     BP 11/17/21 1506 120/79     Pulse Rate 11/17/21 1506 74     Resp 11/17/21 1506 18     Temp 11/17/21 1506 98.2 F (36.8 C)     Temp Source 11/17/21 1506 Oral  SpO2 11/17/21 1506 100 %     Weight 11/17/21 1507 151 lb (68.5 kg)     Height 11/17/21 1507 5\' 1"  (1.549 m)     Head Circumference --      Peak Flow --      Pain Score 11/17/21 1506 0     Pain Loc --      Pain Edu? --      Excl. in GC? --    No data found.  Updated Vital Signs BP 120/79 (BP Location: Left Arm)    Pulse 74    Temp 98.2 F (36.8 C) (Oral)    Resp 18    Ht 5\' 1"  (1.549 m)    Wt 68.5 kg    SpO2 100%    BMI 28.53 kg/m       Physical Exam Constitutional:      General: She is not in acute distress.    Appearance: She is well-developed.  HENT:     Head: Normocephalic and atraumatic.     Right Ear: Tympanic membrane normal.     Left Ear: Tympanic membrane and ear canal normal.     Nose: Congestion and rhinorrhea present.      Mouth/Throat:     Pharynx: Posterior oropharyngeal erythema present.     Comments: Nasal congestion.  Clear rhinorrhea.  Posterior pharynx has postnasal drip, mild erythema. Eyes:     Conjunctiva/sclera: Conjunctivae normal.     Pupils: Pupils are equal, round, and reactive to light.  Cardiovascular:     Rate and Rhythm: Normal rate and regular rhythm.  Pulmonary:     Effort: Pulmonary effort is normal. No respiratory distress.     Breath sounds: Normal breath sounds.  Abdominal:     General: There is no distension.     Palpations: Abdomen is soft.  Musculoskeletal:        General: Normal range of motion.     Cervical back: Normal range of motion.  Lymphadenopathy:     Cervical: Cervical adenopathy present.  Skin:    General: Skin is warm and dry.  Neurological:     Mental Status: She is alert.  Psychiatric:        Mood and Affect: Mood normal.        Behavior: Behavior normal.     UC Treatments / Results  Labs (all labs ordered are listed, but only abnormal results are displayed) Labs Reviewed - No data to display  EKG   Radiology No results found.  Procedures Procedures (including critical care time)  Medications Ordered in UC Medications - No data to display  Initial Impression / Assessment and Plan / UC Course  I have reviewed the triage vital signs and the nursing notes.  Pertinent labs & imaging results that were available during my care of the patient were reviewed by me and considered in my medical decision making (see chart for details).     Discussed viral URI.  Antibiotics not indicated unless she is fails to improve by 7 to 10 days Final Clinical Impressions(s) / UC Diagnoses   Final diagnoses:  Viral upper respiratory tract infection     Discharge Instructions      Drink lots of fluids Run a humidifier if you have hot May use over-the-counter cough and cold medicine as needed Saline sinus rinses or saline nasal spray are  advisable Flonase may help with your congestion Take the antibiotic if you fail to see improvement by 7 to 10 days  ED Prescriptions     Medication Sig Dispense Auth. Provider   amoxicillin (AMOXIL) 875 MG tablet Take 1 tablet (875 mg total) by mouth 2 (two) times daily. 14 tablet Eustace Moore, MD      PDMP not reviewed this encounter.   Eustace Moore, MD 11/17/21 1535

## 2021-11-17 NOTE — Telephone Encounter (Signed)
Needs Rx resent

## 2021-11-17 NOTE — Discharge Instructions (Addendum)
Drink lots of fluids Run a humidifier if you have hot May use over-the-counter cough and cold medicine as needed Saline sinus rinses or saline nasal spray are advisable Flonase may help with your congestion Take the antibiotic if you fail to see improvement by 7 to 10 days

## 2021-12-28 ENCOUNTER — Emergency Department
Admission: RE | Admit: 2021-12-28 | Discharge: 2021-12-28 | Disposition: A | Discharge status: Home / Self Care | Payer: BC Managed Care – PPO | Source: Ambulatory Visit | Attending: Family Medicine | Admitting: Family Medicine

## 2021-12-28 ENCOUNTER — Other Ambulatory Visit: Payer: Self-pay

## 2021-12-28 VITALS — BP 111/76 | HR 85 | Temp 98.9°F | Resp 18 | Ht 61.0 in | Wt 152.0 lb

## 2021-12-28 DIAGNOSIS — J988 Other specified respiratory disorders: Secondary | ICD-10-CM

## 2021-12-28 DIAGNOSIS — B9789 Other viral agents as the cause of diseases classified elsewhere: Secondary | ICD-10-CM | POA: Diagnosis not present

## 2021-12-28 LAB — POCT INFLUENZA A/B
Influenza A, POC: NEGATIVE
Influenza B, POC: NEGATIVE

## 2021-12-28 LAB — POC SARS CORONAVIRUS 2 AG -  ED: SARS Coronavirus 2 Ag: NEGATIVE

## 2021-12-28 NOTE — Discharge Instructions (Signed)
Drink lots of water ?Take ibuprofen, or Tylenol as needed for pain and fever ?May use over-the-counter cough and cold medicines as needed ?

## 2021-12-28 NOTE — ED Provider Notes (Signed)
?[facility] ? ? ? ?CSN: [id number] ?Arrival date & time: 12/28/21  1138 ? ? ?  ? ?History   ?Chief Complaint ?Chief Complaint  ?Patient presents with  ? Fever  ?  Fever of 100.2 and tired.  Need COVID test as many students and teachers have had COVID around me. - Entered by patient  ? ? ?HPI ?Suzanne Tucker is a 41 y.o. female.  ? ?HPI ? ?Patient is a Runner, broadcasting/film/video.  She has a fever and feels tired today.  She is concerned about COVID.  She is here for testing.  Her daughter is here also.  She has a sore throat and fever as well. ?Patient is not vaccinated for flu or COVID ?She has not taken any medications ? ?Past Medical History:  ?Diagnosis Date  ? Anginal pain (HCC) 02/2019  ? related to MVP  ? Back pain   ? Mitral prolapse   ? ? ?Patient Active Problem List  ? Diagnosis Date Noted  ? Right flank pain 02/16/2021  ? 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 Carpal tunnel syndrome on right 11/02/2017  ? MVP (mitral valve prolapse) 10/28/2017  ? ? ?Past Surgical History:  ?Procedure Laterality Date  ? CESAREAN SECTION  03/30/2008  ? ROBOTIC ASSISTED TOTAL HYSTERECTOMY Bilateral 03/15/2019  ? Procedure: XI ROBOTIC ASSISTED TOTAL HYSTERECTOMY with Bilateral Salpingectomy;  Surgeon: Silverio Lay, MD;  Location: [facility] [place];  Service: Gynecology;  Laterality: Bilateral;  ? TUBAL LIGATION  2017  ? ? ?OB History   ?No obstetric history on file. ?  ? ? ? ?Home Medications   ? ?Prior to Admission medications   ?Medication Sig Start Date End Date Taking? Authorizing Provider  ?aspirin 81 MG chewable tablet Chew 81 mg by mouth daily.   Yes [provider]  ?fluticasone (FLONASE) 50 MCG/ACT nasal spray Place 2 sprays into both nostrils daily. 12/21/20  Yes [provider]  ?Multiple Vitamin (MULTI-VITAMIN DAILY PO) Take by mouth daily.   Yes [provider]  ?fexofenadine (ALLEGRA ALLERGY) 180 MG tablet Take 1 tablet (180 mg total) by mouth daily for 15 days. 06/27/21 08/05/21  Trevor Iha, FNP  ? ? ?Family History ?Family History  ?Problem Relation Age of Onset  ? Healthy Mother   ? Stroke Mother   ? Alzheimer's disease Father   ? Diabetes Maternal Grandmother   ? Heart disease Maternal Grandmother   ? Heart attack Maternal Grandmother   ? Diabetes Maternal Grandfather   ? Heart disease Maternal Grandfather   ? Alzheimer's disease Paternal Grandmother   ? Dementia Paternal Grandmother   ? Alzheimer's disease Paternal Grandfather   ? Dementia Paternal Grandfather   ? Alcohol abuse Paternal Grandfather   ? ? ?Social History ?Social History  ? ?Tobacco Use  ? Smoking status: Never  ? Smokeless tobacco: Never  ?Vaping Use  ? Vaping Use: Never used  ?Substance Use Topics  ? Alcohol use: Not Currently  ?  Comment:    ? Drug use: No  ? ? ? ?Allergies   ?Morphine, Morphine and related, Tessalon [benzonatate], Hydrocodone, Latex, Codeine, and Tramadol ? ? ?Review of Systems ?Review of Systems ?See HPI ? ?Physical Exam ?Triage Vital Signs ?ED Triage Vitals  ?Enc Vitals Group  ?   BP 12/28/21 1153 111/76  ?   Pulse Rate 12/28/21 1153 85  ?   Resp 12/28/21 1153 18  ?   Temp 12/28/21 1153 98.9 ?F (37.2 ?C)  ?   Temp Source  12/28/21 1153 Oral  ?   SpO2 12/28/21 1153 98 %  ?   Weight 12/28/21 1151 152 lb (68.9 kg)  ?   Height 12/28/21 1151 5\' 1"  (1.549 m)  ?   Head Circumference --   ?   Peak Flow --   ?   Pain Score 12/28/21 1151 0  ?   Pain Loc --   ?   Pain Edu? --   ?   Excl. in GC? --   ? ?No data found. ? ?Updated Vital Signs ?BP 111/76 (BP Location: Right Arm)   Pulse 85   Temp 98.9 ?F (37.2 ?C) (Oral)   Resp 18   Ht 5\' 1"  (1.549 m)   Wt 68.9 kg   SpO2 98%   BMI 28.72 kg/m?  ? ? ?Physical Exam ?Constitutional:   ?   General: She is not in acute distress. ?   Appearance: Normal appearance. She is well-developed. She is not ill-appearing.  ?HENT:  ?   Head: Normocephalic and atraumatic.  ?   Right Ear: Tympanic membrane normal.  ?   Left Ear: Tympanic membrane normal.  ?   Nose: Nose normal. No  congestion.  ?   Mouth/Throat:  ?   Pharynx: No posterior oropharyngeal erythema.  ?Eyes:  ?   Conjunctiva/sclera: Conjunctivae normal.  ?   Pupils: Pupils are equal, round, and reactive to light.  ?Cardiovascular:  ?   Rate and Rhythm: Normal rate and regular rhythm.  ?   Heart sounds: Normal heart sounds.  ?Pulmonary:  ?   Effort: Pulmonary effort is normal. No respiratory distress.  ?   Breath sounds: Normal breath sounds.  ?Abdominal:  ?   General: There is no distension.  ?   Palpations: Abdomen is soft.  ?Musculoskeletal:     ?   General: Normal range of motion.  ?   Cervical back: Normal range of motion.  ?Lymphadenopathy:  ?   Cervical: No cervical adenopathy.  ?Skin: ?   General: Skin is warm and dry.  ?Neurological:  ?   Mental Status: She is alert.  ?Psychiatric:     ?   Mood and Affect: Mood normal.     ?   Behavior: Behavior normal.  ? ? ? ?UC Treatments / Results  ?Labs ?(all labs ordered are listed, but only abnormal results are displayed) ?Labs Reviewed  ?POC SARS CORONAVIRUS 2 AG -  ED - Normal  ?POCT INFLUENZA A/B - Normal  ? ? ?EKG ? ? ?Radiology ?No results found. ? ?Procedures ?Procedures (including critical care time) ? ?Medications Ordered in UC ?Medications - No data to display ? ?Initial Impression / Assessment and Plan / UC Course  ?I have reviewed the triage vital signs and the nursing notes. ? ?Pertinent labs & imaging results that were available during my care of the patient were reviewed by me and considered in my medical decision making (see chart for details). ? ?  ? ?Flu and COVID tests are negative.  Daughter had a strep test as well and this is also negative.  Viral illness discussed ?Final Clinical Impressions(s) / UC Diagnoses  ? ?Final diagnoses:  ?Viral respiratory illness  ? ? ? ?Discharge Instructions   ? ?  ?Drink lots of water ?Take ibuprofen, or Tylenol as needed for pain and fever ?May use over-the-counter cough and cold medicines as needed ? ? ?ED Prescriptions   ?None ?   ? ?PDMP not reviewed this encounter. ?  ?12/30/21,  Letta Pate, MD ?12/28/21 1257 ? ?

## 2021-12-28 NOTE — ED Triage Notes (Signed)
Pt states that she has been running a fever. X1 day ? ?Pt states that she hasn't had any medication for fever.  ? ?Pt states that she isn't vaccinated for covid. ?Pt states that she isn't vaccinated for flu.  ?

## 2022-03-04 DIAGNOSIS — R42 Dizziness and giddiness: Secondary | ICD-10-CM | POA: Insufficient documentation

## 2022-04-05 ENCOUNTER — Telehealth: Payer: Self-pay | Admitting: Interventional Cardiology

## 2022-04-05 NOTE — Telephone Encounter (Signed)
Called patient back about message. Patient stated she is doing a little better now, but she would like to see someone for the espisode of SOB, minor dizziness, and palpitations. Made patient an appointment to see her Cardiologist on his DOD day.

## 2022-04-05 NOTE — Telephone Encounter (Signed)
Pt sent this Via Mychart to the Sch pool:     The SOB has been for a few days now.  I have tha before, so it is not worrying me much.  Chest pressure, and mild chest pain Friday evening.  I did have too much caffeine on Friday.  This weekend o have limited caffeine and have had no caffeine today.  I did get too much sun yesterday and did some yard work this morning.  I am just sitting now and had a palpitations and just heavy Breathing.  Maybe the heat could be to blame?  I am just worried maybe my MVP regurgitation has gotten bigger.  I can not remember my last ECHO. Thank you for your quick response.     Good Afternoon Gelila,  Can you tell me a little more about your Shortness of breath?     1. Are you currently SOB (can you hear that pt is SOB on the phone)?   2. How long have you been experiencing SOB?   3. Are you SOB when sitting or when up moving around?   4. Are you currently experiencing any other symptoms?        Comments: Hey there,  I have been having difficulty breathing, and also having palpitations.  This is been going on for a few days now.  I also had a minor dizzy spell yesterday.  I think I need an appointment for a check up.

## 2022-04-07 NOTE — Progress Notes (Unsigned)
Cardiology Office Note   Date:  04/08/2022   ID:  [name], [name] 1980-11-03, MRN [id number]  PCP:  Georgina Quint, MD    No chief complaint on file.  PVC, shortness of breath  Wt Readings from Last 3 Encounters:  04/08/22 157 lb (71.2 kg)  12/28/21 152 lb (68.9 kg)  11/17/21 151 lb (68.5 kg)       History of Present Illness: Suzanne Tucker is a 41 y.o. female  With a h/o mitral valve prolapse.   She is a Education officer, museum.   She carried this diagnosis for several years.  She was living in [place], [place]., Dr. Melanie Crazier.  Was not using antibiotics for dental appts.   In her family, her maternal grandmother had pacemaker and valve replacement.  Maternal GF with MI.  Cousin had a valve repair.   2020 echo showed normal LV/RV function.  Normal valvular function as well.    She has had palpitations, but has not worn a monitor.  No other heart tests done.  Sx worse with EtOH and caffeine in the past.    She had GYN surgery in June 2020 successfully.  No heart problems at that time.    Vegan diet.     She got the first dose of Pfizer vaccine in Feb 2021.  She had tongue swelling and did not get the seond dose as scheduled.  In 08/2022 visit: "[place] [address] with caffeine. Shortlived.    Normal LVEF in 03/2019.    Diagnosed with COVID in 12/21.  Had prolonged fevers which have resolved.  After 2 weeks, she has had more palpitations.   Jan 2022 monitor showed: "Some sx with PVCs which are benign.  No significant abnormalities."   She had some caffeine in 10/22 which was out of the ordinary and developed palpitations, followed by anxiety and then chest pressure.  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  She just had COVID 3 weeks ago with sx for 5 days. She  did not take an antiviral."  She had sone well for a while.  Over the past weeks, she has had a few episodes of palpitatons.  There was some  mild dizziness.  Over she has been feeling tired.  She has had some fluctuations in weight over the past few weeks.  No change in appetite.  No palpitations thus far today.  Feels some dizziness as well which is new.  She remains active in the yard, but not doing specific exercise.    Decreased caffeine in the past few days.   She has CPAP.  She was trying to get a dental apparatus to help.  Sleep study was done through her dentist.   Denies :  Dizziness. Leg edema. Nitroglycerin use. Orthopnea. Palpitations. Paroxysmal nocturnal dyspnea. Shortness of breath. Syncope.    No exertional chest pain.   Past Medical History:  Diagnosis Date   Anginal pain (HCC) 02/2019   related to MVP   Back pain    Mitral prolapse     Past Surgical History:  Procedure Laterality Date   CESAREAN SECTION  03/30/2008   ROBOTIC ASSISTED TOTAL HYSTERECTOMY Bilateral 03/15/2019   Procedure: XI ROBOTIC ASSISTED TOTAL HYSTERECTOMY with Bilateral Salpingectomy;  Surgeon: Silverio Lay, MD;  Location: [facility];  Service: Gynecology;  Laterality: Bilateral;   TUBAL LIGATION  2017     Current Outpatient Medications  Medication Sig Dispense Refill  aspirin 81 MG chewable tablet Chew 81 mg by mouth daily.     fluticasone (FLONASE) 50 MCG/ACT nasal spray Place 2 sprays into both nostrils as needed.     ibuprofen (ADVIL) 800 MG tablet Take 800 mg by mouth as needed for pain.     Multiple Vitamins-Minerals (CENTROVITE) TABS Take by mouth daily.     No current facility-administered medications for this visit.    Allergies:   Morphine, Morphine and related, Tessalon [benzonatate], Hydrocodone, Latex, Codeine, and Tramadol    Social History:  The patient  reports that she has never smoked. She has never used smokeless tobacco. She reports that she does not currently use alcohol. She reports that she does not use drugs.   Family History:  The patient's family history includes Alcohol abuse in her  paternal grandfather; Alzheimer's disease in her father, paternal grandfather, and paternal grandmother; Dementia in her paternal grandfather and paternal grandmother; Diabetes in her maternal grandfather and maternal grandmother; Healthy in her mother; Heart attack in her maternal grandmother; Heart disease in her maternal grandfather and maternal grandmother; Stroke in her mother.    ROS:  Please see the history of present illness.   Otherwise, review of systems are positive for palpitations.   All other systems are reviewed and negative.    PHYSICAL EXAM: VS:  BP 130/72   Pulse 71   Ht 5\' 1"  (1.549 m)   Wt 157 lb (71.2 kg)   SpO2 99%   BMI 29.66 kg/m  , BMI Body mass index is 29.66 kg/m. GEN: Well nourished, well developed, in no acute distress HEENT: normal Neck: no JVD, carotid bruits, or masses Cardiac: RRR; no murmurs, rubs, or gallops,no edema  Respiratory:  clear to auscultation bilaterally, normal work of breathing GI: soft, nontender, nondistended, + BS MS: no deformity or atrophy Skin: warm and dry, no rash Neuro:  Strength and sensation are intact Psych: euthymic mood, full affect   EKG:   The ekg ordered today demonstrates NSR, inferolateral ST depressions- unchanged from prior   Recent Labs: No results found for requested labs within last 365 days.   Lipid Panel No results found for: "CHOL", "TRIG", "HDL", "CHOLHDL", "VLDL", "LDLCALC", "LDLDIRECT"   Other studies Reviewed: Additional studies/ records that were reviewed today with results demonstrating: no recent labs; prior ECG checked.   ASSESSMENT AND PLAN:  MVP/Shortness of breath: atypical chest pain.  Some SHOB with the palpitations.  No signs of heart failure at this time.  If the palpitations got worse or more prolonged, we could consider another monitor.  Check labs including TSH. Palpitations: PVCs noted in the past. Some dizziness this time with current flare up of palpitations.  Chest pressure:  atypical in the past. Similar sx noted of late.  I suspect they wil improve as her palpitations decrease.     Current medicines are reviewed at length with the patient today.  The patient concerns regarding her medicines were addressed.  The following changes have been made:  No change  Labs/ tests ordered today include:  No orders of the defined types were placed in this encounter.   Recommend 150 minutes/week of aerobic exercise Low fat, low carb, high fiber diet recommended  Disposition:   FU as scheduled- she can postpone if feeling well at that time   Signed, , MD  04/08/2022 10:20 AM    [facility] [address] [place], [place], [place]  [place] Phone: [phone]; Fax: [phone]

## 2022-04-08 ENCOUNTER — Encounter: Payer: Self-pay | Admitting: Interventional Cardiology

## 2022-04-08 ENCOUNTER — Ambulatory Visit: Payer: BC Managed Care – PPO | Admitting: Interventional Cardiology

## 2022-04-08 VITALS — BP 130/72 | HR 71 | Ht 61.0 in | Wt 157.0 lb

## 2022-04-08 DIAGNOSIS — R0789 Other chest pain: Secondary | ICD-10-CM

## 2022-04-08 DIAGNOSIS — I341 Nonrheumatic mitral (valve) prolapse: Secondary | ICD-10-CM | POA: Diagnosis not present

## 2022-04-08 DIAGNOSIS — I493 Ventricular premature depolarization: Secondary | ICD-10-CM

## 2022-04-08 DIAGNOSIS — R0602 Shortness of breath: Secondary | ICD-10-CM

## 2022-04-08 DIAGNOSIS — R002 Palpitations: Secondary | ICD-10-CM

## 2022-04-08 NOTE — Patient Instructions (Addendum)
Medication Instructions:  Your physician recommends that you continue on your current medications as directed. Please refer to the Current Medication list given to you today.  *If you need a refill on your cardiac medications before your next appointment, please call your pharmacy*  Lab Work: Your physician recommends that you have lab work today- CMET, CBC, TSH, and lipid panel.  If you have labs (blood work) drawn today and your tests are completely normal, you will receive your results only by: MyChart Message (if you have MyChart) OR A paper copy in the mail If you have any lab test that is abnormal or we need to change your treatment, we will call you to review the results.  Testing/Procedures: None ordered today.   Follow-Up: At [facility], you and your health needs are our priority.  As part of our continuing mission to provide you with exceptional heart care, we have created designated Provider Care Teams.  These Care Teams include your primary Cardiologist (physician) and Advanced Practice Providers (APPs -  Physician Assistants and Nurse Practitioners) who all work together to provide you with the care you need, when you need it.  We recommend signing up for the patient portal called "MyChart".  Sign up information is provided on this After Visit Summary.  MyChart is used to connect with patients for Virtual Visits (Telemedicine).  Patients are able to view lab/test results, encounter notes, upcoming appointments, etc.  Non-urgent messages can be sent to your provider as well.   To learn more about what you can do with MyChart, go to [email or web address].    Your next appointment:   4 months  The format for your next appointment:   In Person  Provider:   Lance Muss, MD {   Important Information About Sugar

## 2022-04-12 LAB — CBC WITH DIFFERENTIAL/PLATELET
Basophils Absolute: 0 10*3/uL (ref 0.0–0.2)
Basos: 0 %
EOS (ABSOLUTE): 0.1 10*3/uL (ref 0.0–0.4)
Eos: 2 %
Hematocrit: 35.8 % (ref 34.0–46.6)
Hemoglobin: 11.6 g/dL (ref 11.1–15.9)
Immature Grans (Abs): 0 10*3/uL (ref 0.0–0.1)
Immature Granulocytes: 0 %
Lymphocytes Absolute: 1.3 10*3/uL (ref 0.7–3.1)
Lymphs: 22 %
MCH: 28.9 pg (ref 26.6–33.0)
MCHC: 32.4 g/dL (ref 31.5–35.7)
MCV: 89 fL (ref 79–97)
Monocytes Absolute: 0.4 10*3/uL (ref 0.1–0.9)
Monocytes: 7 %
Neutrophils Absolute: 3.9 10*3/uL (ref 1.4–7.0)
Neutrophils: 69 %
Platelets: 244 10*3/uL (ref 150–450)
RBC: 4.02 x10E6/uL (ref 3.77–5.28)
RDW: 12.1 % (ref 11.7–15.4)
WBC: 5.7 10*3/uL (ref 3.4–10.8)

## 2022-04-12 LAB — LIPID PANEL
Chol/HDL Ratio: 2.6 ratio (ref 0.0–4.4)
Cholesterol, Total: 162 mg/dL (ref 100–199)
HDL: 63 mg/dL (ref >39)
LDL Chol Calc (NIH): 86 mg/dL (ref 0–99)
Triglycerides: 67 mg/dL (ref 0–149)
VLDL Cholesterol Cal: 13 mg/dL (ref 5–40)

## 2022-04-12 LAB — COMPREHENSIVE METABOLIC PANEL
ALT: 7 IU/L (ref 0–32)
AST: 14 IU/L (ref 0–40)
Albumin/Globulin Ratio: 2 (ref 1.2–2.2)
Albumin: 4.3 g/dL (ref 3.9–4.9)
Alkaline Phosphatase: 69 IU/L (ref 44–121)
BUN/Creatinine Ratio: 14 (ref 9–23)
BUN: 9 mg/dL (ref 6–20)
Bilirubin Total: 0.4 mg/dL (ref 0.0–1.2)
CO2: 23 mmol/L (ref 20–29)
Calcium: 9.2 mg/dL (ref 8.7–10.2)
Chloride: 104 mmol/L (ref 96–106)
Creatinine, Ser: 0.66 mg/dL (ref 0.57–1.00)
Globulin, Total: 2.2 g/dL (ref 1.5–4.5)
Glucose: 78 mg/dL (ref 70–99)
Potassium: 4 mmol/L (ref 3.5–5.2)
Sodium: 140 mmol/L (ref 134–144)
Total Protein: 6.5 g/dL (ref 6.0–8.5)
eGFR: 114 mL/min/{1.73_m2} (ref >59)

## 2022-04-12 LAB — TSH: TSH: 1.56 u[IU]/mL (ref 0.450–4.500)

## 2022-06-17 ENCOUNTER — Ambulatory Visit
Admission: RE | Admit: 2022-06-17 | Discharge: 2022-06-17 | Disposition: A | Discharge status: Home / Self Care | Payer: BC Managed Care – PPO | Source: Ambulatory Visit | Attending: Family Medicine | Admitting: Family Medicine

## 2022-06-17 VITALS — BP 112/75 | HR 92 | Temp 98.7°F | Resp 20 | Ht 61.0 in | Wt 152.0 lb

## 2022-06-17 DIAGNOSIS — R5383 Other fatigue: Secondary | ICD-10-CM | POA: Diagnosis not present

## 2022-06-17 LAB — RESP PANEL BY RT-PCR (FLU A&B, COVID) ARPGX2
Influenza A by PCR: NEGATIVE
Influenza B by PCR: NEGATIVE
SARS Coronavirus 2 by RT PCR: NEGATIVE

## 2022-06-17 NOTE — ED Provider Notes (Signed)
[facility]    CSN: [id number] Arrival date & time: 06/17/22  1230      History   Chief Complaint Chief Complaint  Patient presents with   Fever   Nausea   Fatigue    HPI Suzanne Tucker is a 41 y.o. female.   HPI Pleasant 41 year old female presents with nausea, fatigue and fever since yesterday.  Reports has been exposed to COVID-19.  Has not done COVID test.  PMH significant for right flank pain, MVP and carpal tunnel syndrome on the right.  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      Home Medications    Prior to Admission medications   Medication Sig Start Date End Date Taking? Authorizing Provider  aspirin 81 MG chewable tablet Chew 81 mg by mouth daily.    [provider]  fluticasone (FLONASE) 50 MCG/ACT nasal spray Place 2 sprays into both nostrils as needed. 12/21/20   [provider]  ibuprofen (ADVIL) 800 MG tablet Take 800 mg by mouth as needed for pain. 02/05/22   [provider]  Multiple Vitamins-Minerals (CENTROVITE) TABS Take by mouth daily.    [provider]    Family History Family History  Problem Relation Age of Onset   Stroke Mother    Alzheimer's disease Father    Diabetes Maternal Grandmother    Heart  disease Maternal Grandmother    Heart attack Maternal Grandmother    Diabetes Maternal Grandfather    Heart disease Maternal Grandfather    Alzheimer's disease Paternal Grandmother    Dementia Paternal Grandmother    Alzheimer's disease Paternal Grandfather    Dementia Paternal Grandfather    Alcohol abuse Paternal Grandfather     Social History Social History   Tobacco Use   Smoking status: Never   Smokeless tobacco: Never  Vaping Use   Vaping Use: Never used  Substance Use Topics   Alcohol use: Not Currently    Comment:     Drug use: No     Allergies   Morphine, Morphine and related, Tessalon [benzonatate], Hydrocodone, Latex, Codeine, and Tramadol   Review of Systems Review of Systems  Constitutional:  Positive for fatigue.  Gastrointestinal:  Positive for nausea.     Physical Exam Triage Vital Signs ED Triage Vitals  Enc Vitals Group     BP 06/17/22 1309 112/75     Pulse Rate 06/17/22 1309 92     Resp 06/17/22 1309 20     Temp 06/17/22 1309 98.7 F (37.1 C)     Temp Source 06/17/22 1309 Oral     SpO2 06/17/22 1309 99 %     Weight 06/17/22 1306 152 lb (68.9 kg)     Height 06/17/22 1306 5\' 1"  (1.549  m)     Head Circumference --      Peak Flow --      Pain Score 06/17/22 1306 0     Pain Loc --      Pain Edu? --      Excl. in [place]? --    No data found.  Updated Vital Signs BP 112/75 (BP Location: Right Arm)   Pulse 92   Temp 98.7 F (37.1 C) (Oral)   Resp 20   Ht 5\' 1"  (1.549 m)   Wt 152 lb (68.9 kg)   SpO2 99%   BMI 28.72 kg/m      Physical Exam Vitals and nursing note reviewed.  Constitutional:      Appearance: Normal appearance. She is normal weight.  HENT:     Head: Normocephalic and atraumatic.     Right Ear: Tympanic membrane, ear canal and external ear normal.     Left Ear: Tympanic membrane, ear canal and external ear normal.     Mouth/Throat:     Mouth: Mucous membranes are moist.     Pharynx: Oropharynx is clear.  Eyes:      Extraocular Movements: Extraocular movements intact.     Conjunctiva/sclera: Conjunctivae normal.     Pupils: Pupils are equal, round, and reactive to light.  Cardiovascular:     Rate and Rhythm: Normal rate and regular rhythm.     Pulses: Normal pulses.     Heart sounds: Normal heart sounds. No murmur heard. Pulmonary:     Effort: Pulmonary effort is normal.     Breath sounds: Normal breath sounds. No wheezing, rhonchi or rales.  Musculoskeletal:     Cervical back: Normal range of motion and neck supple.  Skin:    General: Skin is warm and dry.  Neurological:     General: No focal deficit present.     Mental Status: She is alert and oriented to person, place, and time.      UC Treatments / Results  Labs (all labs ordered are listed, but only abnormal results are displayed) Labs Reviewed  RESP PANEL BY RT-PCR (FLU A&B, COVID) [id number]    EKG   Radiology No results found.  Procedures Procedures (including critical care time)  Medications Ordered in UC Medications - No data to display  Initial Impression / Assessment and Plan / UC Course  I have reviewed the triage vital signs and the nursing notes.  Pertinent labs & imaging results that were available during my care of the patient were reviewed by me and considered in my medical decision making (see chart for details).     MDM: 1.  Fatigue-lab ordered, work note provided for patient prior to discharge. Advised patient we will follow-up with COVID-19 PCR results once received.  Patient discharged home, hemodynamically stable. Final Clinical Impressions(s) / UC Diagnoses   Final diagnoses:  Fatigue, unspecified type     Discharge Instructions      Advised patient we will follow-up with COVID-19 PCR results once received.     ED Prescriptions   None    PDMP not reviewed this encounter.   [id number], FNP 06/17/22 1411

## 2022-06-17 NOTE — ED Triage Notes (Signed)
Pt presents to Urgent Care with c/o nausea, fatigue, and fever since yesterday. Has been exposed to COVID and norovirus. Has not done COVID test.

## 2022-06-17 NOTE — Discharge Instructions (Addendum)
Advised patient we will follow-up with COVID-19 PCR results once received.

## 2022-07-02 ENCOUNTER — Ambulatory Visit
Admission: RE | Admit: 2022-07-02 | Discharge: 2022-07-02 | Disposition: A | Discharge status: Home / Self Care | Payer: BC Managed Care – PPO | Source: Ambulatory Visit | Attending: Emergency Medicine | Admitting: Emergency Medicine

## 2022-07-02 VITALS — BP 126/85 | HR 68 | Temp 98.7°F | Resp 16

## 2022-07-02 DIAGNOSIS — L235 Allergic contact dermatitis due to other chemical products: Secondary | ICD-10-CM | POA: Diagnosis not present

## 2022-07-02 DIAGNOSIS — T7840XA Allergy, unspecified, initial encounter: Secondary | ICD-10-CM

## 2022-07-02 NOTE — ED Triage Notes (Signed)
Pt states she used a face mask yesterday that had latex and she has a history of mild allergy. Left side of face is red and swollen and painful. She has tried benadryl with some relief.

## 2022-07-02 NOTE — ED Provider Notes (Signed)
[facility]    CSN: [id number] Arrival date & time: 07/02/22  1546      History   Chief Complaint Chief Complaint  Patient presents with   Allergic Reaction    Latex allergy - Entered by patient    HPI Suzanne Tucker is a 41 y.o. female.   Patient reports she used a Breathe Right nasal strip to try to help with snoring.  Patient reports after she put the strip on her nose and face began burning.  Patient reports she has an allergy to latex.  The history is provided by the patient. No language interpreter was used.  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      Home Medications    Prior to Admission medications   Medication Sig Start Date End Date Taking? Authorizing Provider  aspirin 81 MG chewable tablet Chew 81 mg by mouth daily.    [provider]  fluticasone (FLONASE) 50 MCG/ACT nasal spray Place 2 sprays into both nostrils as needed. 12/21/20   [provider]  ibuprofen (ADVIL) 800 MG tablet Take 800 mg by mouth as needed for pain. 02/05/22   [provider]  Multiple  Vitamins-Minerals (CENTROVITE) TABS Take by mouth daily.    [provider]    Family History Family History  Problem Relation Age of Onset   Stroke Mother    Alzheimer's disease Father    Diabetes Maternal Grandmother    Heart disease Maternal Grandmother    Heart attack Maternal Grandmother    Diabetes Maternal Grandfather    Heart disease Maternal Grandfather    Alzheimer's disease Paternal Grandmother    Dementia Paternal Grandmother    Alzheimer's disease Paternal Grandfather    Dementia Paternal Grandfather    Alcohol abuse Paternal Grandfather     Social History Social History   Tobacco Use   Smoking status: Never   Smokeless tobacco: Never  Vaping Use   Vaping Use: Never used  Substance Use Topics   Alcohol use: Not Currently    Comment:     Drug use: No     Allergies   Morphine, Morphine and related, Tessalon [benzonatate], Hydrocodone, Latex, Codeine, and Tramadol   Review of Systems Review of Systems  Skin:  Positive for itching and rash.  All other systems reviewed and are negative.    Physical Exam Triage Vital Signs ED Triage Vitals  Enc Vitals Group     BP 07/02/22 1609 126/85     Pulse Rate 07/02/22 1609 68     Resp 07/02/22 1609 16  Temp 07/02/22 1609 98.7 F (37.1 C)     Temp Source 07/02/22 1609 Oral     SpO2 07/02/22 1609 100 %     Weight --      Height --      Head Circumference --      Peak Flow --      Pain Score 07/02/22 1611 6     Pain Loc --      Pain Edu? --      Excl. in [place]? --    No data found.  Updated Vital Signs BP 126/85 (BP Location: Right Arm)   Pulse 68   Temp 98.7 F (37.1 C) (Oral)   Resp 16   SpO2 100%   Visual Acuity Right Eye Distance:   Left Eye Distance:   Bilateral Distance:    Right Eye Near:   Left Eye Near:    Bilateral Near:     Physical Exam Vitals reviewed.  Constitutional:      Appearance: Normal appearance.  Cardiovascular:     Rate and Rhythm: Normal rate.   Pulmonary:     Effort: Pulmonary effort is normal.  Skin:    Comments: Erythema bilateral cheeks and mid nose  Neurological:     General: No focal deficit present.     Mental Status: She is alert.  Psychiatric:        Mood and Affect: Mood normal.      UC Treatments / Results  Labs (all labs ordered are listed, but only abnormal results are displayed) Labs Reviewed - No data to display  EKG   Radiology No results found.  Procedures Procedures (including critical care time)  Medications Ordered in UC Medications - No data to display  Initial Impression / Assessment and Plan / UC Course  I have reviewed the triage vital signs and the nursing notes.  Pertinent labs & imaging results that were available during my care of the patient were reviewed by me and considered in my medical decision making (see chart for details).     MDM: Patient advised to use topical hydrocortisone ointment to the area of irritation for the next 2 days she is advised Benadryl for itching Final Clinical Impressions(s) / UC Diagnoses   Final diagnoses:  Allergic reaction, initial encounter  Allergic dermatitis due to other chemical product     Discharge Instructions      Continue benadryl  Use a small amount of topical hydrocortisone to area of irritation, twice a day for 2 days    ED Prescriptions   None    PDMP not reviewed this encounter. An After Visit Summary was printed and given to the patient.    Fransico Meadow, [place] 07/02/22 1929

## 2022-07-02 NOTE — Discharge Instructions (Signed)
Continue benadryl  Use a small amount of topical hydrocortisone to area of irritation, twice a day for 2 days

## 2022-07-03 ENCOUNTER — Telehealth: Payer: Self-pay | Admitting: Emergency Medicine

## 2022-07-05 ENCOUNTER — Telehealth: Payer: Self-pay | Admitting: Emergency Medicine

## 2022-07-05 ENCOUNTER — Telehealth: Payer: BC Managed Care – PPO | Admitting: Physician Assistant

## 2022-07-05 DIAGNOSIS — R220 Localized swelling, mass and lump, head: Secondary | ICD-10-CM

## 2022-07-05 MED ORDER — CEPHALEXIN 500 MG PO CAPS: 500.0000 mg | ORAL_CAPSULE | Freq: Four times a day (QID) | ORAL | 0 refills | Status: AC

## 2022-07-05 NOTE — Progress Notes (Signed)
[name], Suzanne Tucker are scheduled for a virtual visit with your provider today.    Just as we do with appointments in the office, we must obtain your consent to participate.  Your consent will be active for this visit and any virtual visit you may have with one of our providers in the next 365 days.    If you have a MyChart account, I can also send a copy of this consent to you electronically.  All virtual visits are billed to your insurance company just like a traditional visit in the office.  As this is a virtual visit, video technology does not allow for your provider to perform a traditional examination.  This may limit your provider's ability to fully assess your condition.  If your provider identifies any concerns that need to be evaluated in person or the need to arrange testing such as labs, EKG, etc, we will make arrangements to do so.    Although advances in technology are sophisticated, we cannot ensure that it will always work on either your end or our end.  If the connection with a video visit is poor, we may have to switch to a telephone visit.  With either a video or telephone visit, we are not always able to ensure that we have a secure connection.   I need to obtain your verbal consent now.   Are you willing to proceed with your visit today?   Jill Pagliarulo has provided verbal consent on 07/05/2022 for a virtual visit (video or telephone).   [name], [place] 07/05/2022  6:54 PM   Date:  07/05/2022   ID:  Suzanne Tucker, DOB 03-27-1981, MRN [id number]  Patient Location: Home Provider Location: Home Office   Participants: Patient and Provider for Visit and Wrap up  Method of visit: Video  Location of Patient: Home Location of Provider: Home Office Consent was obtain for visit over the video. Services rendered by provider: Visit was performed via video  A video enabled telemedicine application was used and I verified that I am speaking with the correct person using two  identifiers.  PCP:  Horald Pollen, MD   Chief Complaint:  facial swelling  History of Present Illness:    Suzanne Tucker is a 41 y.o. female with history as stated below. Presents video telehealth for an acute care visit  Pt states that 4 days ago she used a latex nose strip. She has a known history of latex. She started having redness and swelling to the face. She went to urgent care and they recommended benadryl which she has been taking without relief. She continues to have pain, redness and swelling to the left side of the face.  Denies pain with eye movement or vision changes. Denies fevers.  Past Medical, Surgical, Social History, Allergies, and Medications have been Reviewed.  Past Medical History:  Diagnosis Date   Anginal pain ([place]) 02/2019   related to MVP   Back pain    Mitral prolapse     Current Meds  Medication Sig   cephALEXin (KEFLEX) 500 MG capsule Take 1 capsule (500 mg total) by mouth 4 (four) times daily for 7 days.     Allergies:   Morphine, Morphine and related, Tessalon [benzonatate], Hydrocodone, Latex, Codeine, and Tramadol   ROS See HPI for history of present illness.  Physical Exam HENT:     Head:     Comments: Erythema and swelling to the left cheek  MDM: pt with recent allergic reaction to the skin 4 days ago. Now with continued redness/swelling/pain. Concern for early cellulitis. Will tx with abx.     There are no diagnoses linked to this encounter.   Time:   Today, I have spent 15 minutes with the patient with telehealth technology discussing the above problems, reviewing the chart, previous notes, medications and orders.    Tests Ordered: No orders of the defined types were placed in this encounter.   Medication Changes: Meds ordered this encounter  Medications   cephALEXin (KEFLEX) 500 MG capsule    Sig: Take 1 capsule (500 mg total) by mouth 4 (four) times daily for 7 days.    Dispense:  28 capsule     Refill:  0    Order Specific Question:   Supervising Provider    Answer:   Chase Picket [id number]     Disposition:  Follow up  Signed, [name], PA-C  07/05/2022 6:54 PM

## 2022-07-05 NOTE — Telephone Encounter (Signed)
Patient advised of Suzanne Tucker's recommendation.  Will follow up with either her PCP in a couple of days or follow up here at [facility].

## 2022-07-05 NOTE — Telephone Encounter (Signed)
Patient called regarding her visit on Friday.  Patient has almost like a chemical burn on her face or an allergic reaction to latex.  It is bright red and painful under her left eye down to her left nose.  Patient has been taken Benadryl and using Aveeno lotion to the area.  Patient was instructed on Friday to use hydrocortisone cream to the area but it burns too much.  Is there anything else patient can use or try?  [facility].  Please advise.

## 2022-07-05 NOTE — Telephone Encounter (Signed)
She should not use hydrocortisone if this is worsening her symptoms.  She can continue hypoallergenic lotion such as Aveeno or an emollient such as Aquaphor.  She can stop the Benadryl and start cetirizine in the morning and famotidine at night.  These are both available over-the-counter.  If it is worsening in any way, however, I would recommend she be reevaluated to ensure there is no evidence of infection.

## 2022-07-05 NOTE — Patient Instructions (Signed)
  Maniah Fullbright, thank you for joining [facility], PA-C for today's virtual visit.  While this provider is not your primary care provider (PCP), if your PCP is located in our provider database this encounter information will be shared with them immediately following your visit.  Consent: (Patient) Suzanne Tucker provided verbal consent for this virtual visit at the beginning of the encounter.  Current Medications:  Current Outpatient Medications:    aspirin 81 MG chewable tablet, Chew 81 mg by mouth daily., Disp: , Rfl:    fluticasone (FLONASE) 50 MCG/ACT nasal spray, Place 2 sprays into both nostrils as needed., Disp: , Rfl:    ibuprofen (ADVIL) 800 MG tablet, Take 800 mg by mouth as needed for pain., Disp: , Rfl:    Multiple Vitamins-Minerals (CENTROVITE) TABS, Take by mouth daily., Disp: , Rfl:    Medications ordered in this encounter:  No orders of the defined types were placed in this encounter.    *If you need refills on other medications prior to your next appointment, please contact your pharmacy*  Follow-Up: Call back or seek an in-person evaluation if the symptoms worsen or if the condition fails to improve as anticipated.  [place] [phone]  Other Instructions You were given a prescription for antibiotics. Please take the antibiotic prescription fully.   Follow up with your regular doctor in 1 week for reassessment and seek care sooner if your symptoms worsen or fail to improve.    If you have been instructed to have an in-person evaluation today at a local Urgent Care facility, please use the link below. It will take you to a list of all of our available [facility], including address, phone number and hours of operation. Please do not delay care.  [facility]  If you or a family member do not have a primary care provider, use the link below to schedule a visit and establish care. When you choose a [facility]  primary care physician or advanced practice provider, you gain a long-term partner in health. Find a Primary Care Provider  Learn more about [facility]'s in-office and virtual care options: [facility]

## 2022-07-07 ENCOUNTER — Ambulatory Visit: Payer: BC Managed Care – PPO | Admitting: Internal Medicine

## 2022-08-10 ENCOUNTER — Ambulatory Visit (INDEPENDENT_AMBULATORY_CARE_PROVIDER_SITE_OTHER): Payer: BC Managed Care – PPO

## 2022-08-10 ENCOUNTER — Ambulatory Visit
Admission: RE | Admit: 2022-08-10 | Discharge: 2022-08-10 | Disposition: A | Discharge status: Home / Self Care | Payer: BC Managed Care – PPO | Source: Ambulatory Visit | Attending: Urgent Care | Admitting: Urgent Care

## 2022-08-10 VITALS — BP 113/72 | HR 107 | Temp 100.7°F | Resp 18

## 2022-08-10 DIAGNOSIS — Z79899 Other long term (current) drug therapy: Secondary | ICD-10-CM | POA: Diagnosis not present

## 2022-08-10 DIAGNOSIS — Z1152 Encounter for screening for COVID-19: Secondary | ICD-10-CM | POA: Diagnosis not present

## 2022-08-10 DIAGNOSIS — R079 Chest pain, unspecified: Secondary | ICD-10-CM | POA: Insufficient documentation

## 2022-08-10 DIAGNOSIS — R070 Pain in throat: Secondary | ICD-10-CM | POA: Diagnosis present

## 2022-08-10 DIAGNOSIS — J101 Influenza due to other identified influenza virus with other respiratory manifestations: Secondary | ICD-10-CM | POA: Diagnosis not present

## 2022-08-10 DIAGNOSIS — R509 Fever, unspecified: Secondary | ICD-10-CM | POA: Diagnosis present

## 2022-08-10 DIAGNOSIS — B349 Viral infection, unspecified: Secondary | ICD-10-CM | POA: Diagnosis present

## 2022-08-10 LAB — RESP PANEL BY RT-PCR (FLU A&B, COVID) ARPGX2
Influenza A by PCR: NEGATIVE
Influenza B by PCR: POSITIVE — AB
SARS Coronavirus 2 by RT PCR: NEGATIVE

## 2022-08-10 LAB — POCT RAPID STREP A (OFFICE): Rapid Strep A Screen: NEGATIVE

## 2022-08-10 MED ORDER — OSELTAMIVIR PHOSPHATE 75 MG PO CAPS: 75.0000 mg | ORAL_CAPSULE | Freq: Two times a day (BID) | ORAL | 0 refills | Status: DC

## 2022-08-10 MED ORDER — IBUPROFEN 800 MG PO TABS
800.0000 mg | ORAL_TABLET | Freq: Once | ORAL | Status: AC
  Administered 2022-08-10: 800 mg via ORAL

## 2022-08-10 NOTE — ED Provider Notes (Signed)
[facility]  Note:  This document was prepared using Systems analyst and may include unintentional dictation errors.  MRN: [id number] DOB: 1981-08-07  Subjective:   Suzanne Tucker is a 41 y.o. female presenting for 3 day history of acute onset fever, body aches, throat pain, congestion.  She is not experiencing right-sided lateral chest pain worse when she coughs.  She has previously had COVID, last episode was February of this year.  No history of respiratory disorders.  Patient is not a smoker, no drug use, no vaping.  She has exposure to all sorts of illnesses including strep, COVID, flu as she works as a Education officer, museum.  No current facility-administered medications for this encounter.  Current Outpatient Medications:    aspirin 81 MG chewable tablet, Chew 81 mg by mouth daily., Disp: , Rfl:    ibuprofen (ADVIL) 800 MG tablet, Take 800 mg by mouth as needed for pain., Disp: , Rfl:    Multiple Vitamins-Minerals (CENTROVITE) TABS, Take by mouth daily., Disp: , Rfl:    Allergies  Allergen Reactions   Morphine Shortness Of Breath   Morphine And Related Anaphylaxis   Tessalon [Benzonatate] Hives    Hives, itching, burning all over.    Hydrocodone Hives   Latex Rash   Codeine Nausea And Vomiting   Tramadol Other (See Comments)    Tingling in the mouth with swelling     Past Medical History:  Diagnosis Date   Anginal pain ([place]) 02/2019   related to MVP   Back pain    Mitral prolapse      Past Surgical History:  Procedure Laterality Date   ABDOMINAL HYSTERECTOMY     CESAREAN SECTION  03/30/2008   ROBOTIC ASSISTED TOTAL HYSTERECTOMY Bilateral 03/15/2019   Procedure: XI ROBOTIC ASSISTED TOTAL HYSTERECTOMY with Bilateral Salpingectomy;  Surgeon: Delsa Bern, MD;  Location: [facility];  Service: Gynecology;  Laterality: Bilateral;   TUBAL LIGATION  2017    Family History  Problem Relation Age of Onset   Stroke  Mother    Alzheimer's disease Father    Diabetes Maternal Grandmother    Heart disease Maternal Grandmother    Heart attack Maternal Grandmother    Diabetes Maternal Grandfather    Heart disease Maternal Grandfather    Alzheimer's disease Paternal Grandmother    Dementia Paternal Grandmother    Alzheimer's disease Paternal Grandfather    Dementia Paternal Grandfather    Alcohol abuse Paternal Grandfather     Social History   Tobacco Use   Smoking status: Never   Smokeless tobacco: Never  Vaping Use   Vaping Use: Never used  Substance Use Topics   Alcohol use: Not Currently    Comment:     Drug use: No    ROS   Objective:   Vitals: BP 113/72 (BP Location: Right Arm)   Pulse (!) 107   Temp (!) 100.7 F (38.2 C) (Oral)   Resp 18   SpO2 95%   Physical Exam Constitutional:      General: She is not in acute distress.    Appearance: Normal appearance. She is well-developed. She is not ill-appearing, toxic-appearing or diaphoretic.  HENT:     Head: Normocephalic and atraumatic.     Right Ear: External ear normal.     Left Ear: External ear normal.     Nose: Congestion present. No rhinorrhea.     Mouth/Throat:     Mouth: Mucous membranes are moist.  Pharynx: Posterior oropharyngeal erythema present. No pharyngeal swelling, oropharyngeal exudate or uvula swelling.     Tonsils: No tonsillar exudate or tonsillar abscesses. 0 on the right. 0 on the left.  Eyes:     General: No scleral icterus.       Right eye: No discharge.        Left eye: No discharge.     Extraocular Movements: Extraocular movements intact.  Cardiovascular:     Rate and Rhythm: Normal rate and regular rhythm.     Heart sounds: Normal heart sounds. No murmur heard.    No friction rub. No gallop.  Pulmonary:     Effort: Pulmonary effort is normal. No respiratory distress.     Breath sounds: No stridor. No wheezing, rhonchi or rales.  Chest:     Chest wall: No tenderness.  Skin:    General:  Skin is warm and dry.  Neurological:     General: No focal deficit present.     Mental Status: She is alert and oriented to person, place, and time.  Psychiatric:        Mood and Affect: Mood normal.        Behavior: Behavior normal.     Results for orders placed or performed during the hospital encounter of 08/10/22 (from the past 24 hour(s))  POCT rapid strep A     Status: None   Collection Time: 08/10/22 10:16 AM  Result Value Ref Range   Rapid Strep A Screen Negative Negative   DG Chest 2 View  Result Date: 08/10/2022 CLINICAL DATA:  Right chest pain EXAM: CHEST - 2 VIEW COMPARISON:  11/25/2016 FINDINGS: Cardiac size is within normal limits. There are no signs of pulmonary edema or focal pulmonary consolidation. There is no pleural effusion or pneumothorax. No significant interval changes are noted. IMPRESSION: No active cardiopulmonary disease. Electronically Signed   By: Ernie Avena M.D.   On: 08/10/2022 10:41    Assessment and Plan :   PDMP not reviewed this encounter.  1. Acute viral syndrome   2. Throat pain   3. Fever, unspecified   4. Right-sided chest pain     Negative chest x-ray.  Strep culture pending. COVID and flu test pending.  Given the timeline of her illness I recommended going ahead and starting Tamiflu to treat empirically for influenza.  Should her test be negative she is to stop Tamiflu.  If she test positive for COVID-19, recommend taking Paxlovid.  We will otherwise manage for viral upper respiratory infection.  Physical exam findings reassuring and vital signs stable for discharge. Advised supportive care, offered symptomatic relief. Counseled patient on potential for adverse effects with medications prescribed/recommended today, ER and return-to-clinic precautions discussed, patient verbalized understanding.      Wallis Bamberg, PA-C 08/10/22 1057

## 2022-08-10 NOTE — ED Triage Notes (Signed)
Pt c/o fever, body aches, cough, sore throat, congestion, and rt rib pain since Sunday. States last tylenol was at 8am.

## 2022-08-10 NOTE — Discharge Instructions (Addendum)
We will notify you of your test results as they arrive and may take between about 24 hours.  I encourage you to sign up for MyChart if you have not already done so as this can be the easiest way for [place] to communicate results to you online or through a phone app.  Generally, we only contact you if it is a positive test result.  In the meantime, if you develop worsening symptoms including fever, chest pain, shortness of breath despite our current treatment plan then please report to the emergency room as this may be a sign of worsening status from possible viral infection.  Go ahead and start Tamiflu to address influenza. If your test is negative, then stop the Tamiflu. Otherwise, if your COVID test is positive we will start you on Paxlovid.  For sore throat or cough try using a honey-based tea. Use 3 teaspoons of honey with juice squeezed from half lemon. Place shaved pieces of ginger into 1/2-1 cup of water and warm over stove top. Then mix the ingredients and repeat every 4 hours as needed. Please take Tylenol 500mg -650mg  every 6 hours for aches and pains, fevers. Hydrate very well with at least 2 liters of water. Eat light meals such as soups to replenish electrolytes and soft fruits, veggies. Start an antihistamine like Zyrtec for postnasal drainage, sinus congestion.  You can take this together with pseudoephedrine (Sudafed) at a dose of 60 mg 2-3 times a day as needed for the same kind of congestion.

## 2022-08-11 ENCOUNTER — Encounter: Payer: Self-pay | Admitting: Interventional Cardiology

## 2022-08-12 ENCOUNTER — Ambulatory Visit: Payer: BC Managed Care – PPO | Admitting: Interventional Cardiology

## 2022-08-13 LAB — CULTURE, GROUP A STREP (THRC)

## 2022-10-04 HISTORY — PX: BREAST SURGERY: SHX581

## 2022-11-14 ENCOUNTER — Encounter: Payer: Self-pay | Admitting: Interventional Cardiology

## 2022-11-15 ENCOUNTER — Ambulatory Visit: Payer: BC Managed Care – PPO | Attending: Internal Medicine | Admitting: Internal Medicine

## 2022-11-15 ENCOUNTER — Encounter: Payer: Self-pay | Admitting: *Deleted

## 2022-11-15 ENCOUNTER — Encounter: Payer: Self-pay | Admitting: Internal Medicine

## 2022-11-15 ENCOUNTER — Other Ambulatory Visit: Payer: Self-pay | Admitting: Internal Medicine

## 2022-11-15 VITALS — BP 114/70 | HR 68 | Ht 61.0 in | Wt 154.0 lb

## 2022-11-15 DIAGNOSIS — R002 Palpitations: Secondary | ICD-10-CM

## 2022-11-15 DIAGNOSIS — R42 Dizziness and giddiness: Secondary | ICD-10-CM

## 2022-11-15 DIAGNOSIS — R0602 Shortness of breath: Secondary | ICD-10-CM

## 2022-11-15 NOTE — Patient Instructions (Addendum)
Medication Instructions:  Your physician recommends that you continue on your current medications as directed. Please refer to the Current Medication list given to you today.  *If you need a refill on your cardiac medications before your next appointment, please call your pharmacy*   Lab Work: None ordered.  If you have labs (blood work) drawn today and your tests are completely normal, you will receive your results only by: [place] (if you have MyChart) OR A paper copy in the mail If you have any lab test that is abnormal or we need to change your treatment, we will call you to review the results.   Testing/Procedures: Your physician has recommended that you wear an event monitor. Event monitors are medical devices that record the heart's electrical activity. Doctors most often [place] these monitors to diagnose arrhythmias. Arrhythmias are problems with the speed or rhythm of the heartbeat. The monitor is a small, portable device. You can wear one while you do your normal daily activities. This is usually used to diagnose what is causing palpitations/syncope (passing out).    Follow-Up: At [facility], you and your health needs are our priority.  As part of our continuing mission to provide you with exceptional heart care, we have created designated Provider Care Teams.  These Care Teams include your primary Cardiologist (physician) and Advanced Practice Providers (APPs -  Physician Assistants and Nurse Practitioners) who all work together to provide you with the care you need, when you need it.  We recommend signing up for the patient portal called "MyChart".  Sign up information is provided on this After Visit Summary.  MyChart is used to connect with patients for Virtual Visits (Telemedicine).  Patients are able to view lab/test results, encounter notes, upcoming appointments, etc.  Non-urgent messages can be sent to your provider as well.   To learn more about what you can  do with MyChart, go to [email or web address].    Your next appointment:   4 months telephone visit with Dr Caryl Comes   Other Instructions Discussed salt substitutes with a  goal of 2-3 gms of Sodium or 5-7 gm of sodium Chloride daily.  Rehydration solutions include Liquid IV, NUUN, TriOral, Normralyte pedialyte advanced care and Banana Bags.  Salt tablets include plain salt tablets, SaltStick Vitassium, thermatabs amongst others.   The higher sodium concentration per serving on this list is normalyte about 800 mg of sodium per serving LMNT1000 mg and most recently of worried about Within You hydration formula also at 1000 mg  Salt supplements are best used with adjunctive sugar  Also discussed the role of compression wear including thigh sleeves and abdominal binders.  Calf compression is not specifically recommended..    [employer] Cardiac Event Monitor Instructions  Your physician has requested you wear your cardiac event monitor for _____ days, (1-30). [employer] may call or text to confirm a shipping address. The monitor will be sent to a land address via UPS. [employer] will not ship a monitor to a PO BOX. It typically takes 3-5 days to receive your monitor after it has been enrolled. [employer] will assist with USPS tracking if your package is delayed. The telephone number for [employer] is [phone]. Once you have received your monitor, please review the enclosed instructions. Instruction tutorials can also be viewed under help and settings on the enclosed cell phone. Your monitor has already been registered assigning a specific monitor serial # to you.  Billing and Self Pay Discount Information  [employer] has been provided  the insurance information we had on file for you.  If your insurance has been updated, please call [employer] at [phone] to provide them with your updated insurance information.   [employer] offers a discounted Self Pay option for patients who have  insurance that does not cover their cardiac event monitor or patients without insurance.  The discounted cost of a Self Pay Cardiac Event Monitor would be $225.00 , if the patient contacts [employer] at [phone] within 7 days of applying the monitor to make payment arrangements.  If the patient does not contact [employer] within 7 days of applying the monitor, the cost of the cardiac event monitor will be $350.00.  Applying the monitor  Remove cell phone from case and turn it on. The cell phone works as Dealer and needs to be within [employer] of you at all times. The cell phone will need to be charged on a daily basis. We recommend you plug the cell phone into the enclosed charger at your bedside table every night.  Monitor batteries: You will receive two monitor batteries labelled #1 and #2. These are your recorders. Plug battery #2 onto the second connection on the enclosed charger. Keep one battery on the charger at all times. This will keep the monitor battery deactivated. It will also keep it fully charged for when you need to switch your monitor batteries. A small light will be blinking on the battery emblem when it is charging. The light on the battery emblem will remain on when the battery is fully charged.  Open package of a Monitor strip. Insert battery #1 into black hood on strip and gently squeeze monitor battery onto connection as indicated in instruction booklet. Set aside while preparing skin.  Choose location for your strip, vertical or horizontal, as indicated in the instruction booklet. Shave to remove all hair from location. There cannot be any lotions, oils, powders, or colognes on skin where monitor is to be applied. Wipe skin clean with enclosed Saline wipe. Dry skin completely.  Peel paper labeled #1 off the back of the Monitor strip exposing the adhesive. Place the monitor on the chest in the vertical or horizontal position shown in the instruction booklet.  One arrow on the monitor strip must be pointing upward. Carefully remove paper labeled #2, attaching remainder of strip to your skin. Try not to create any folds or wrinkles in the strip as you apply it.  Firmly press and release the circle in the center of the monitor battery. You will hear a small beep. This is turning the monitor battery on. The heart emblem on the monitor battery will light up every 5 seconds if the monitor battery in turned on and connected to the patient securely. Do not push and hold the circle down as this turns the monitor battery off. The cell phone will locate the monitor battery. A screen will appear on the cell phone checking the connection of your monitor strip. This may read poor connection initially but change to good connection within the next minute. Once your monitor accepts the connection you will hear a series of 3 beeps followed by a climbing crescendo of beeps. A screen will appear on the cell phone showing the two monitor strip placement options. Touch the picture that demonstrates where you applied the monitor strip.  Your monitor strip and battery are waterproof. You are able to shower, bathe, or swim with the monitor on. They just ask you do not submerge deeper than 3 feet  underwater. We recommend removing the monitor if you are swimming in a lake, river, or ocean.  Your monitor battery will need to be switched to a fully charged monitor battery approximately once a week. The cell phone will alert you of an action which needs to be made.  On the cell phone, tap for details to reveal connection status, monitor battery status, and cell phone battery status. The green dots indicates your monitor is in good status. A red dot indicates there is something that needs your attention.  To record a symptom, click the circle on the monitor battery. In 30-60 seconds a list of symptoms will appear on the cell phone. Select your symptom and tap save. Your  monitor will record a sustained or significant arrhythmia regardless of you clicking the button. Some patients do not feel the heart rhythm irregularities. [employer] will notify us of any serious or critical events.  Refer to instruction booklet for instructions on switching batteries, changing strips, the Do not disturb or Pause features, or any additional questions.  Call [employer] at [phone], to confirm your monitor is transmitting and record your baseline. They will answer any questions you may have regarding the monitor instructions at that time.  Returning the monitor to [place] all equipment back into blue box. Peel off strip of paper to expose adhesive and close box securely. There is a prepaid UPS shipping label on this box. Drop in a UPS drop box, or at a UPS facility like [employer]. You may also contact [employer] to arrange UPS to pick up monitor package at your home.

## 2022-11-15 NOTE — Progress Notes (Signed)
Patient enrolled for [employer]/ [employer] to ship a 30 day cardiac event monitor to her address on file.

## 2022-11-15 NOTE — Telephone Encounter (Signed)
Patient stated on 2/11 she was experiencing sever dizziness, shortness of breath upon exertion, and chest tightness. Pt. Started to experiencing heart palpations though out the night. Denies swelling of the extremities. Talk to Dr. Caryl Comes (D.O.D) he agreed to see the patient, she is scheduled 2/12 @ 1150. Patient voiced understanding.

## 2022-11-15 NOTE — Progress Notes (Signed)
Patient Care Team: Horald Pollen, MD as PCP - General (Internal Medicine) Jettie Booze, MD as PCP - Cardiology (Cardiology)   HPI  Suzanne Tucker is a 42 y.o. female is seen as an add-on today because of palpitations and chest discomfort.  She has been seen for the symptoms by Dr. Saundra Shelling for some time.  Echocardiogram 2020 demonstrated normal LV function RV function and normal valvular function despite a longstanding history of "mitral valve prolapse "  Monitor 2022 demonstrated PVCs associated with symptoms  Imaging studies reviewed and without vascular calcification reported  Longstanding hx of syncope and presyncope.  Stereotypical prodrome with head rush and fullness some nausea cold sensation followed by recovery phase is also stereotypical with diaphoresis nausea residual orthostatic intolerance.  Has had this similar episode with phlebotomy Has heat intolerance shower intolerance and menses intolerance.  Does not use alcohol or recreational drugs  Had two episodes of presyncope in the last week both with the same prodrome. Some increase in palpiations which previously have been associated with PVCs.  She also has 1 "aggressive "lasting fractions of minutes to 20s of minutes.  Which are worse.  Associate with lightheadedness and chest discomfort.  She is not fit and has dyspnea on exertion, palpitations with stairs Diet is deplete of sodium and modestly replete of fluid   Records and Results Reviewed   Past Medical History:  Diagnosis Date   Anginal pain ([place]) 02/2019   related to MVP   Back pain    Mitral prolapse     Past Surgical History:  Procedure Laterality Date   ABDOMINAL HYSTERECTOMY     CESAREAN SECTION  03/30/2008   ROBOTIC ASSISTED TOTAL HYSTERECTOMY Bilateral 03/15/2019   Procedure: XI ROBOTIC ASSISTED TOTAL HYSTERECTOMY with Bilateral Salpingectomy;  Surgeon: Delsa Bern, MD;  Location: [facility];  Service:  Gynecology;  Laterality: Bilateral;   TUBAL LIGATION  2017    Current Meds  Medication Sig   aspirin 81 MG chewable tablet Chew 81 mg by mouth daily.   ibuprofen (ADVIL) 800 MG tablet Take 800 mg by mouth as needed for pain.   Multiple Vitamins-Minerals (CENTROVITE) TABS Take by mouth daily.    Allergies  Allergen Reactions   Morphine Shortness Of Breath   Morphine And Related Anaphylaxis   Tessalon [Benzonatate] Hives    Hives, itching, burning all over.    Hydrocodone Hives   Latex Rash   Codeine Nausea And Vomiting   Tramadol Other (See Comments)    Tingling in the mouth with swelling       Review of Systems negative except from HPI and PMH  Physical Exam BP 114/70   Pulse 68   Ht 5' 1"$  (1.549 m)   Wt 154 lb (69.9 kg)   SpO2 99%   BMI 29.10 kg/m  Well developed and well nourished in no acute distress HENT normal E scleral and icterus clear Neck Supple JVP flat; carotids brisk and full Clear to ausculation Regular rate and rhythm, no murmurs gallops or rub Soft with active bowel sounds No clubbing cyanosis  Edema Alert and oriented, grossly normal motor and sensory function Skin Warm and Dry  ECG sinus at 68 Intervals 09/10/38 Nonspecific ST-T changes present 7/23  CrCl cannot be calculated (Patient's most recent lab result is older than the maximum 21 days allowed.).   Assessment and  Plan Neurally mediated syncope and presyncope  Chest pain atypical  PVCs  Palpitations associated  with the above   We discussed extensively the issues of dysautonomia, the physiology of orthstasis and positional stress.  We discussed the role of salt and water repletion, the importance of exercise, often needing to be started in the recumbent position, and the awareness of triggers and the role of ambient heat and dehydration  Discussed salt substitutes with a  goal of 2-3 gms of Sodium or 5-7 gm of sodium Chloride daily.  Rehydration solutions include Liquid IV,  NUUN, TriOral, Normralyte pedialyte advanced care and Banana Bags.  Salt tablets include plain salt tablets, SaltStick Vitassium, thermatabs amongst others.   The higher sodium concentration per serving on this list is normalyte about 800 mg of sodium per serving LMNT1000 mg and most recently of worried about Within You hydration formula also at 1000 mg  Salt supplements are best used with adjunctive sugar  Also discussed the role of compression wear including thigh sleeves and abdominal binders.  Calf compression is not specifically recommended..  Palpitations have been linked to PVCs before.  Will get a 30-day monitor as the "aggressive ones "are about that frequent.  But not long enough lasting normally to use a Jodelle Red  Will see again in 4 mon     Current medicines are reviewed at length with the patient today .  The patient does not  have concerns regarding medicines.

## 2022-11-16 ENCOUNTER — Encounter: Payer: Self-pay | Admitting: Internal Medicine

## 2022-11-18 ENCOUNTER — Encounter: Payer: Self-pay | Admitting: Internal Medicine

## 2022-11-19 ENCOUNTER — Ambulatory Visit: Payer: BC Managed Care – PPO | Attending: Internal Medicine

## 2022-11-19 DIAGNOSIS — R42 Dizziness and giddiness: Secondary | ICD-10-CM | POA: Diagnosis not present

## 2022-11-19 DIAGNOSIS — R002 Palpitations: Secondary | ICD-10-CM | POA: Diagnosis not present

## 2022-11-23 ENCOUNTER — Encounter: Payer: Self-pay | Admitting: Emergency Medicine

## 2022-11-23 ENCOUNTER — Ambulatory Visit (INDEPENDENT_AMBULATORY_CARE_PROVIDER_SITE_OTHER): Payer: BC Managed Care – PPO | Admitting: Emergency Medicine

## 2022-11-23 VITALS — BP 120/76 | HR 69 | Temp 98.1°F | Ht 61.0 in | Wt 155.1 lb

## 2022-11-23 DIAGNOSIS — R002 Palpitations: Secondary | ICD-10-CM | POA: Insufficient documentation

## 2022-11-23 DIAGNOSIS — R29818 Other symptoms and signs involving the nervous system: Secondary | ICD-10-CM | POA: Diagnosis not present

## 2022-11-23 DIAGNOSIS — Z1329 Encounter for screening for other suspected endocrine disorder: Secondary | ICD-10-CM | POA: Diagnosis not present

## 2022-11-23 DIAGNOSIS — Z13220 Encounter for screening for lipoid disorders: Secondary | ICD-10-CM

## 2022-11-23 DIAGNOSIS — Z114 Encounter for screening for human immunodeficiency virus [HIV]: Secondary | ICD-10-CM | POA: Diagnosis not present

## 2022-11-23 DIAGNOSIS — Z1159 Encounter for screening for other viral diseases: Secondary | ICD-10-CM

## 2022-11-23 DIAGNOSIS — R42 Dizziness and giddiness: Secondary | ICD-10-CM | POA: Diagnosis not present

## 2022-11-23 DIAGNOSIS — Z0001 Encounter for general adult medical examination with abnormal findings: Secondary | ICD-10-CM | POA: Diagnosis not present

## 2022-11-23 DIAGNOSIS — Z130 Encounter for screening for diseases of the blood and blood-forming organs and certain disorders involving the immune mechanism: Secondary | ICD-10-CM

## 2022-11-23 DIAGNOSIS — Z13228 Encounter for screening for other metabolic disorders: Secondary | ICD-10-CM

## 2022-11-23 LAB — LIPID PANEL
Cholesterol: 170 mg/dL (ref 0–200)
HDL: 63.7 mg/dL (ref >39.00)
LDL Cholesterol: 78 mg/dL (ref 0–99)
NonHDL: 105.8
Total CHOL/HDL Ratio: 3
Triglycerides: 137 mg/dL (ref 0.0–149.0)
VLDL: 27.4 mg/dL (ref 0.0–40.0)

## 2022-11-23 LAB — CBC WITH DIFFERENTIAL/PLATELET
Basophils Absolute: 0 10*3/uL (ref 0.0–0.1)
Basophils Relative: 0.3 % (ref 0.0–3.0)
Eosinophils Absolute: 0.1 10*3/uL (ref 0.0–0.7)
Eosinophils Relative: 2 % (ref 0.0–5.0)
HCT: 36.8 % (ref 36.0–46.0)
Hemoglobin: 12.5 g/dL (ref 12.0–15.0)
Lymphocytes Relative: 27.7 % (ref 12.0–46.0)
Lymphs Abs: 1.4 10*3/uL (ref 0.7–4.0)
MCHC: 34.1 g/dL (ref 30.0–36.0)
MCV: 88.5 fl (ref 78.0–100.0)
Monocytes Absolute: 0.4 10*3/uL (ref 0.1–1.0)
Monocytes Relative: 7 % (ref 3.0–12.0)
Neutro Abs: 3.3 10*3/uL (ref 1.4–7.7)
Neutrophils Relative %: 63 % (ref 43.0–77.0)
Platelets: 286 10*3/uL (ref 150.0–400.0)
RBC: 4.16 Mil/uL (ref 3.87–5.11)
RDW: 12.8 % (ref 11.5–15.5)
WBC: 5.2 10*3/uL (ref 4.0–10.5)

## 2022-11-23 LAB — COMPREHENSIVE METABOLIC PANEL
ALT: 10 U/L (ref 0–35)
AST: 18 U/L (ref 0–37)
Albumin: 4 g/dL (ref 3.5–5.2)
Alkaline Phosphatase: 55 U/L (ref 39–117)
BUN: 10 mg/dL (ref 6–23)
CO2: 28 mEq/L (ref 19–32)
Calcium: 9.2 mg/dL (ref 8.4–10.5)
Chloride: 103 mEq/L (ref 96–112)
Creatinine, Ser: 0.64 mg/dL (ref 0.40–1.20)
GFR: 111.02 mL/min (ref >60.00)
Glucose, Bld: 99 mg/dL (ref 70–99)
Potassium: 4.1 mEq/L (ref 3.5–5.1)
Sodium: 137 mEq/L (ref 135–145)
Total Bilirubin: 0.3 mg/dL (ref 0.2–1.2)
Total Protein: 7 g/dL (ref 6.0–8.3)

## 2022-11-23 LAB — TSH: TSH: 1.15 u[IU]/mL (ref 0.35–5.50)

## 2022-11-23 NOTE — Assessment & Plan Note (Signed)
No syncopal episodes.  Recently seen by cardiologist. Wearing monitor for 30 days Normal EKG and echocardiogram

## 2022-11-23 NOTE — Patient Instructions (Signed)

## 2022-11-23 NOTE — Progress Notes (Signed)
Suzanne Tucker 42 y.o.   Chief Complaint  Patient presents with   Annual Exam    Patient has been having a lot of dizzy spells and palpations, she was seen at her cardiology. She has a Holter monitor on for 30 days.     HISTORY OF PRESENT ILLNESS: This is a 42 y.o. female here for annual exam. Has been having palpitations and dizzy spells for the past several weeks. Recently saw cardiologist.  Office visit assessment and plan as follows: Assessment and  Plan Neurally mediated syncope and presyncope   Chest pain atypical   PVCs   Palpitations associated with the above     We discussed extensively the issues of dysautonomia, the physiology of orthstasis and positional stress.  We discussed the role of salt and water repletion, the importance of exercise, often needing to be started in the recumbent position, and the awareness of triggers and the role of ambient heat and dehydration   Discussed salt substitutes with a  goal of 2-3 gms of Sodium or 5-7 gm of sodium Chloride daily.  Rehydration solutions include Liquid IV, NUUN, TriOral, Normralyte pedialyte advanced care and Banana Bags.  Salt tablets include plain salt tablets, SaltStick Vitassium, thermatabs amongst others.    The higher sodium concentration per serving on this list is normalyte about 800 mg of sodium per serving [id number] mg and most recently of worried about Within You hydration formula also at 1000 mg   Salt supplements are best used with adjunctive sugar   Also discussed the role of compression wear including thigh sleeves and abdominal binders.  Calf compression is not specifically recommended..   Palpitations have been linked to PVCs before.  Will get a 30-day monitor as the "aggressive ones "are about that frequent.  But not long enough lasting normally to use a Jodelle Red   Will see again in 4 mon  HPI   Prior to Admission medications   Medication Sig Start Date End Date Taking? Authorizing Provider   aspirin 81 MG chewable tablet Chew 81 mg by mouth daily.   Yes [provider]  ibuprofen (ADVIL) 800 MG tablet Take 800 mg by mouth as needed for pain. 02/05/22  Yes [provider]  Multiple Vitamins-Minerals (CENTROVITE) TABS Take by mouth daily.   Yes [provider]  sodium chloride 1 g tablet Take 1 g by mouth 3 (three) times daily.   Yes [provider]    Allergies  Allergen Reactions   Morphine Shortness Of Breath   Morphine And Related Anaphylaxis   Tessalon [Benzonatate] Hives    Hives, itching, burning all over.    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   Carpal tunnel syndrome on right 11/02/2017   MVP (mitral valve prolapse) 10/28/2017    Past Medical History:  Diagnosis Date   Anginal pain ([place]) 02/2019   related to MVP   Back pain    Mitral prolapse     Past Surgical History:  Procedure Laterality Date   ABDOMINAL HYSTERECTOMY     CESAREAN SECTION  03/30/2008   ROBOTIC ASSISTED TOTAL HYSTERECTOMY Bilateral 03/15/2019   Procedure: XI ROBOTIC ASSISTED TOTAL HYSTERECTOMY with Bilateral Salpingectomy;  Surgeon: Delsa Bern, MD;  Location: [facility];  Service: Gynecology;  Laterality: Bilateral;   TUBAL LIGATION  2017    Social History   Socioeconomic History   Marital status: Single    Spouse name: Not on file   Number of children: Not on file   Years of education: Not on file   Highest education level: Not on file  Occupational History   Not on file  Tobacco Use   Smoking status: Never   Smokeless tobacco: Never  Vaping Use   Vaping Use: Never used  Substance and Sexual Activity   Alcohol use: Not Currently    Comment:     Drug use: No   Sexual  activity: Never    Birth control/protection: Surgical  Other Topics Concern   Not on file  Social History Narrative   Not on file   Social Determinants of Health   Financial Resource Strain: Not on file  Food Insecurity: Not on file  Transportation Needs: Not on file  Physical Activity: Not on file  Stress: Not on file  Social Connections: Not on file  Intimate Partner Violence: Not on file    Family History  Problem Relation Age of Onset   Stroke Mother    Alzheimer's disease Father    Diabetes Maternal Grandmother    Heart disease Maternal Grandmother    Heart attack Maternal Grandmother    Diabetes Maternal Grandfather    Heart disease Maternal Grandfather    Alzheimer's disease Paternal Grandmother    Dementia Paternal Grandmother    Alzheimer's disease Paternal Grandfather    Dementia Paternal Grandfather    Alcohol abuse Paternal Grandfather      Review of Systems  Constitutional: Negative.  Negative for chills and fever.  HENT: Negative.  Negative for congestion and sore throat.   Eyes: Negative.   Respiratory: Negative.  Negative for cough and shortness of breath.   Cardiovascular:  Positive for palpitations.  Gastrointestinal: Negative.  Negative for abdominal pain, nausea and vomiting.  Genitourinary: Negative.  Negative for dysuria and hematuria.  Skin: Negative.  Negative for rash.  Neurological:  Positive for dizziness.  All other systems reviewed and are negative.   Today's Vitals   11/23/22 1452  BP: 120/76  Pulse: 69  Temp: 98.1 F (36.7 C)  TempSrc: Oral  SpO2: 99%  Weight: 155 lb 2 oz (70.4 kg)  Height: 5' 1"$  (1.549 m)   Body mass index is 29.31 kg/m.  Physical Exam Vitals reviewed.  Constitutional:      Appearance: Normal appearance.  HENT:     Head: Normocephalic.     Mouth/Throat:     Mouth: Mucous membranes are moist.     Pharynx: Oropharynx is clear.  Eyes:     Extraocular Movements: Extraocular movements intact.      Conjunctiva/sclera: Conjunctivae normal.     Pupils: Pupils are equal, round, and reactive to light.  Cardiovascular:     Rate and Rhythm: Normal rate and regular rhythm.     Pulses: Normal pulses.     Heart sounds: Normal heart sounds.  Pulmonary:     Effort: Pulmonary effort is normal.     Breath sounds: Normal breath sounds.  Musculoskeletal:     Cervical back: No tenderness.  Lymphadenopathy:     Cervical: No cervical adenopathy.  Skin:    General: Skin is warm and dry.     Capillary Refill: Capillary refill takes less than 2 seconds.  Neurological:     General: No  focal deficit present.     Mental Status: She is alert and oriented to person, place, and time.  Psychiatric:        Mood and Affect: Mood normal.        Behavior: Behavior normal.      ASSESSMENT & PLAN: Problem List Items Addressed This Visit       Other   Dizzy spells    No syncopal episodes.  Recently seen by cardiologist. Wearing monitor for 30 days Normal EKG and echocardiogram      Palpitations    Frequent but not associated with syncopal episodes. Monitor in place, recently placed. Differential diagnosis discussed. Could be related to sleep apnea.  Will refer for sleep apnea studies.      Relevant Orders   Ambulatory referral to Sleep Studies   CBC with Differential   Comprehensive metabolic panel   TSH   Other Visit Diagnoses     Encounter for general adult medical examination with abnormal findings    -  Primary   Relevant Orders   CBC with Differential   Comprehensive metabolic panel   Hemoglobin A1c   Lipid panel   TSH   Hepatitis C antibody screen   HIV antibody   Suspected sleep apnea       Relevant Orders   Ambulatory referral to Sleep Studies   Need for hepatitis C screening test       Relevant Orders   Hepatitis C antibody screen   Screening for HIV (human immunodeficiency virus)       Relevant Orders   HIV antibody   Screening for deficiency anemia       Relevant  Orders   CBC with Differential   Screening for lipoid disorders       Relevant Orders   Lipid panel   Screening for endocrine, metabolic and immunity disorder       Relevant Orders   Comprehensive metabolic panel   Hemoglobin A1c   TSH      Modifiable risk factors discussed with patient. Anticipatory guidance according to age provided. The following topics were also discussed: Social Determinants of Health Smoking.  Non-smoker Diet and nutrition and need to decrease amount of daily carbohydrate intake and daily calories and increase amount of plant-based protein in her diet Benefits of exercise Cancer family history review Vaccinations reviewed and recommendations Cardiovascular risk assessment and need for blood work Need to rule out sleep apnea Mental health including depression and anxiety Fall and accident prevention  Patient Instructions  Health Maintenance, Female Adopting a healthy lifestyle and getting preventive care are important in promoting health and wellness. Ask your health care provider about: The right schedule for you to have regular tests and exams. Things you can do on your own to prevent diseases and keep yourself healthy. What should I know about diet, weight, and exercise? Eat a healthy diet  Eat a diet that includes plenty of vegetables, fruits, low-fat dairy products, and lean protein. Do not eat a lot of foods that are high in solid fats, added sugars, or sodium. Maintain a healthy weight Body mass index (BMI) is used to identify weight problems. It estimates body fat based on height and weight. Your health care provider can help determine your BMI and help you achieve or maintain a healthy weight. Get regular exercise Get regular exercise. This is one of the most important things you can do for your health. Most adults should: Exercise for at least 150 minutes each week.  The exercise should increase your heart rate and make you sweat  (moderate-intensity exercise). Do strengthening exercises at least twice a week. This is in addition to the moderate-intensity exercise. Spend less time sitting. Even light physical activity can be beneficial. Watch cholesterol and blood lipids Have your blood tested for lipids and cholesterol at 40 years of age, then have this test every 5 years. Have your cholesterol levels checked more often if: Your lipid or cholesterol levels are high. You are older than 40 years of age. You are at high risk for heart disease. What should I know about cancer screening? Depending on your health history and family history, you may need to have cancer screening at various ages. This may include screening for: Breast cancer. Cervical cancer. Colorectal cancer. Skin cancer. Lung cancer. What should I know about heart disease, diabetes, and high blood pressure? Blood pressure and heart disease High blood pressure causes heart disease and increases the risk of stroke. This is more likely to develop in people who have high blood pressure readings or are overweight. Have your blood pressure checked: Every 3-5 years if you are 44-81 years of age. Every year if you are 70 years old or older. Diabetes Have regular diabetes screenings. This checks your fasting blood sugar level. Have the screening done: Once every three years after age 29 if you are at a normal weight and have a low risk for diabetes. More often and at a younger age if you are overweight or have a high risk for diabetes. What should I know about preventing infection? Hepatitis B If you have a higher risk for hepatitis B, you should be screened for this virus. Talk with your health care provider to find out if you are at risk for hepatitis B infection. Hepatitis C Testing is recommended for: Everyone born from [birth year] through 1963. Anyone with known risk factors for hepatitis C. Sexually transmitted infections (STIs) Get screened for STIs,  including gonorrhea and chlamydia, if: You are sexually active and are younger than 40 years of age. You are older than 40 years of age and your health care provider tells you that you are at risk for this type of infection. Your sexual activity has changed since you were last screened, and you are at increased risk for chlamydia or gonorrhea. Ask your health care provider if you are at risk. Ask your health care provider about whether you are at high risk for HIV. Your health care provider may recommend a prescription medicine to help prevent HIV infection. If you choose to take medicine to prevent HIV, you should first get tested for HIV. You should then be tested every 3 months for as long as you are taking the medicine. Pregnancy If you are about to stop having your period (premenopausal) and you may become pregnant, seek counseling before you get pregnant. Take 400 to 800 micrograms (mcg) of folic acid every day if you become pregnant. Ask for birth control (contraception) if you want to prevent pregnancy. Osteoporosis and menopause Osteoporosis is a disease in which the bones lose minerals and strength with aging. This can result in bone fractures. If you are 49 years old or older, or if you are at risk for osteoporosis and fractures, ask your health care provider if you should: Be screened for bone loss. Take a calcium or vitamin D supplement to lower your risk of fractures. Be given hormone replacement therapy (HRT) to treat symptoms of menopause. Follow these instructions at home: Alcohol  use Do not drink alcohol if: Your health care provider tells you not to drink. You are pregnant, may be pregnant, or are planning to become pregnant. If you drink alcohol: Limit how much you have to: 0-1 drink a day. Know how much alcohol is in your drink. In the [place] one drink equals one 12 oz bottle of beer (355 mL), one 5 oz glass of wine (148 mL), or one 1 oz glass of hard liquor (44  mL). Lifestyle Do not use any products that contain nicotine or tobacco. These products include cigarettes, chewing tobacco, and vaping devices, such as e-cigarettes. If you need help quitting, ask your health care provider. Do not use street drugs. Do not share needles. Ask your health care provider for help if you need support or information about quitting drugs. General instructions Schedule regular health, dental, and eye exams. Stay current with your vaccines. Tell your health care provider if: You often feel depressed. You have ever been abused or do not feel safe at home. Summary Adopting a healthy lifestyle and getting preventive care are important in promoting health and wellness. Follow your health care provider's instructions about healthy diet, exercising, and getting tested or screened for diseases. Follow your health care provider's instructions on monitoring your cholesterol and blood pressure. This information is not intended to replace advice given to you by your health care provider. Make sure you discuss any questions you have with your health care provider. Document Revised: 02/09/2021 Document Reviewed: 02/09/2021 Elsevier Patient Education  [place], MD [facility]

## 2022-11-23 NOTE — Assessment & Plan Note (Signed)
Frequent but not associated with syncopal episodes. Monitor in place, recently placed. Differential diagnosis discussed. Could be related to sleep apnea.  Will refer for sleep apnea studies.

## 2022-11-24 LAB — HEMOGLOBIN A1C: Hgb A1c MFr Bld: 5.3 % (ref 4.6–6.5)

## 2022-11-24 LAB — HIV ANTIBODY (ROUTINE TESTING W REFLEX): HIV 1&2 Ab, 4th Generation: NONREACTIVE

## 2022-11-24 LAB — HEPATITIS C ANTIBODY: Hepatitis C Ab: NONREACTIVE

## 2022-12-02 ENCOUNTER — Telehealth: Payer: BC Managed Care – PPO | Admitting: Nurse Practitioner

## 2022-12-02 DIAGNOSIS — J069 Acute upper respiratory infection, unspecified: Secondary | ICD-10-CM | POA: Diagnosis not present

## 2022-12-02 MED ORDER — FLUTICASONE PROPIONATE 50 MCG/ACT NA SUSP: 2.0000 | Freq: Every day | NASAL | 0 refills | Status: DC

## 2022-12-02 MED ORDER — AZITHROMYCIN 250 MG PO TABS: ORAL_TABLET | ORAL | 0 refills | Status: AC

## 2022-12-02 NOTE — Progress Notes (Signed)
Virtual Visit Consent   Suzanne Tucker, you are scheduled for a virtual visit with a [facility] provider today. Just as with appointments in the office, your consent must be obtained to participate. Your consent will be active for this visit and any virtual visit you may have with one of our providers in the next 365 days. If you have a MyChart account, a copy of this consent can be sent to you electronically.  As this is a virtual visit, video technology does not allow for your provider to perform a traditional examination. This may limit your provider's ability to fully assess your condition. If your provider identifies any concerns that need to be evaluated in person or the need to arrange testing (such as labs, EKG, etc.), we will make arrangements to do so. Although advances in technology are sophisticated, we cannot ensure that it will always work on either your end or our end. If the connection with a video visit is poor, the visit may have to be switched to a telephone visit. With either a video or telephone visit, we are not always able to ensure that we have a secure connection.  By engaging in this virtual visit, you consent to the provision of healthcare and authorize for your insurance to be billed (if applicable) for the services provided during this visit. Depending on your insurance coverage, you may receive a charge related to this service.  I need to obtain your verbal consent now. Are you willing to proceed with your visit today? Suzanne Tucker has provided verbal consent on 12/02/2022 for a virtual visit (video or telephone). [name], NP  Date: 12/02/2022 5:11 PM  Virtual Visit via Video Note   I, Suzanne Tucker, connected with  Suzanne Tucker  ([id number], September 09, 1981) on 12/02/22 at  5:00 PM EST by a video-enabled telemedicine application and verified that I am speaking with the correct person using two identifiers.  Location: Patient: Virtual Visit  Location Patient: Home Provider: Virtual Visit Location Provider: Home   I discussed the limitations of evaluation and management by telemedicine and the availability of in person appointments. The patient expressed understanding and agreed to proceed.    History of Present Illness: Suzanne Tucker is a 42 y.o. who identifies as a female who was assigned female at birth, and is being seen today for sore throat, nasal congestion, runny nose, cough, fatigue. She denies fever, chills, ear pain, difficulty breathing, and diarrhea (which has since improved). Symptoms started over the past 4 days. 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 She reports she has been taking Tylenol and Motrin for her symptoms.   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    Hydrocodone Hives   Latex Rash   Codeine Nausea And Vomiting   Tramadol Other (See Comments)    Tingling in the mouth with swelling    Medications:  Current Outpatient Medications:    azithromycin (ZITHROMAX) 250 MG tablet, Take 2 tablets on day 1, then 1 tablet daily on days 2 through 5, Disp: 6 tablet,  Rfl: 0   fluticasone (FLONASE) 50 MCG/ACT nasal spray, Place 2 sprays into both nostrils daily., Disp: 16 g, Rfl: 0   aspirin 81 MG chewable tablet, Chew 81 mg by mouth daily., Disp: , Rfl:    ibuprofen (ADVIL) 800 MG tablet, Take 800 mg by mouth as needed for pain., Disp: , Rfl:    Multiple Vitamins-Minerals (CENTROVITE) TABS, Take by mouth daily., Disp: , Rfl:    sodium chloride 1 g tablet, Take 1 g by mouth 3 (three) times  daily., Disp: , Rfl:   Observations/Objective: Patient is well-developed, well-nourished in no acute distress.  Resting comfortably at home.  Head is normocephalic, atraumatic.  No labored breathing. Persistent hacking cough present Speech is clear and coherent with logical content.  Patient is alert and oriented at baseline.    Assessment and Plan: 1. Upper respiratory tract infection, unspecified type - fluticasone (FLONASE) 50 MCG/ACT nasal spray; Place 2 sprays into both nostrils daily.  Dispense: 16 g; Refill: 0 - azithromycin (ZITHROMAX) 250 MG tablet; Take 2 tablets on day 1, then 1 tablet daily on days 2 through 5  Dispense: 6 tablet; Refill: 0  Symptoms consistent with upper respiratory infection with cough. Patient with sensitivity to prednisone, hx of palpitations. Will start patient on azithromycin '250mg'$  for empiric treatment of URI. Fluticasone '50mg'$  nasal spray prescribed for nasal congestion. Supportive care recommendations were provided to the patient with OTC cough medication recommendations, along with indications of when follow-up will be necessary. Patient verbalizes understanding, all questions were answered.    Follow Up Instructions: I discussed the assessment and treatment plan with the patient. The patient was provided an opportunity to ask questions and all were answered. The patient agreed with the plan and demonstrated an understanding of the instructions.  A copy of instructions were sent to the patient via MyChart unless otherwise noted below.   The patient was advised to call back or seek an in-person evaluation if the symptoms worsen or if the condition fails to improve as anticipated.  Time:  I spent 10 minutes with the patient via telehealth technology discussing the above problems/concerns.    [name], NP

## 2022-12-02 NOTE — Patient Instructions (Addendum)
  Suzanne Tucker, thank you for joining Suzanne Men, NP for today's virtual visit.  While this provider is not your primary care provider (PCP), if your PCP is located in our provider database this encounter information will be shared with them immediately following your visit.   [facility] account gives you access to today's visit and all your visits, tests, and labs performed at [facility] " click here if you don't have a [facility] account or go to [email or web address]  Consent: (Patient) Suzanne Tucker provided verbal consent for this virtual visit at the beginning of the encounter.  Current Medications:  Current Outpatient Medications:    azithromycin (ZITHROMAX) 250 MG tablet, Take 2 tablets on day 1, then 1 tablet daily on days 2 through 5, Disp: 6 tablet, Rfl: 0   fluticasone (FLONASE) 50 MCG/ACT nasal spray, Place 2 sprays into both nostrils daily., Disp: 16 g, Rfl: 0   aspirin 81 MG chewable tablet, Chew 81 mg by mouth daily., Disp: , Rfl:    ibuprofen (ADVIL) 800 MG tablet, Take 800 mg by mouth as needed for pain., Disp: , Rfl:    Multiple Vitamins-Minerals (CENTROVITE) TABS, Take by mouth daily., Disp: , Rfl:    sodium chloride 1 g tablet, Take 1 g by mouth 3 (three) times daily., Disp: , Rfl:    Medications ordered in this encounter:  Meds ordered this encounter  Medications   fluticasone (FLONASE) 50 MCG/ACT nasal spray    Sig: Place 2 sprays into both nostrils daily.    Dispense:  16 g    Refill:  0   azithromycin (ZITHROMAX) 250 MG tablet    Sig: Take 2 tablets on day 1, then 1 tablet daily on days 2 through 5    Dispense:  6 tablet    Refill:  0     *If you need refills on other medications prior to your next appointment, please contact your pharmacy*  Follow-Up: Call back or seek an in-person evaluation if the symptoms worsen or if the condition fails to improve as anticipated.  [place] [phone]  Other Instructions Take medication as prescribed. Recommend OTC Robitussin for cough symptoms. Increase fluids and allow for plenty of rest. Recommend Tylenol or ibuprofen as needed for pain, fever, or general discomfort. Warm salt water gargles 3-4 times daily to help with throat pain or discomfort. Recommend using a humidifier at bedtime during sleep to help with cough and nasal congestion. Sleep elevated on 2 pillows. Follow-up if your symptoms do not improve.    If you have been instructed to have an in-person evaluation today at a local Urgent Care facility, please use the link below. It will take you to a list of all of our available [facility], including address, phone number and hours of operation. Please do not delay care.  [facility]  If you or a family member do not have a primary care provider, use the link below to schedule a visit and establish care. When you choose a [facility] primary care physician or advanced practice provider, you gain a long-term partner in health. Find a Primary Care Provider  Learn more about [facility]'s in-office and virtual care options: [facility]

## 2022-12-14 ENCOUNTER — Encounter: Payer: Self-pay | Admitting: Emergency Medicine

## 2022-12-27 ENCOUNTER — Encounter: Payer: Self-pay | Admitting: Internal Medicine

## 2022-12-27 ENCOUNTER — Encounter: Payer: Self-pay | Admitting: Emergency Medicine

## 2022-12-28 ENCOUNTER — Encounter: Payer: Self-pay | Admitting: Emergency Medicine

## 2022-12-28 ENCOUNTER — Ambulatory Visit (INDEPENDENT_AMBULATORY_CARE_PROVIDER_SITE_OTHER): Payer: BC Managed Care – PPO | Admitting: Emergency Medicine

## 2022-12-28 VITALS — BP 116/86 | HR 75 | Temp 98.6°F | Ht 61.0 in | Wt 154.0 lb

## 2022-12-28 DIAGNOSIS — R2231 Localized swelling, mass and lump, right upper limb: Secondary | ICD-10-CM | POA: Diagnosis not present

## 2022-12-28 NOTE — Assessment & Plan Note (Signed)
Clinically stable.  No red flag signs or symptoms No signs of infection. Recommend axillary ultrasound We will follow-up after that

## 2022-12-28 NOTE — Progress Notes (Signed)
Suzanne Tucker 42 y.o.   Chief Complaint  Patient presents with   Mass    Lump under right armpit, lymph node area, patient noticed it Sunday,not painful, a little tender     HISTORY OF PRESENT ILLNESS: This is a 42 y.o. female complaining of mass to right axillary area noted couple days ago.  Denies injury. No associated symptoms. No other complaints or medical concerns today.  HPI   Prior to Admission medications   Medication Sig Start Date End Date Taking? Authorizing Provider  aspirin 81 MG chewable tablet Chew 81 mg by mouth daily.   Yes [provider]  fluticasone (FLONASE) 50 MCG/ACT nasal spray Place 2 sprays into both nostrils daily. 12/02/22  Yes Leath-Warren, Alda Lea, NP  ibuprofen (ADVIL) 800 MG tablet Take 800 mg by mouth as needed for pain. 02/05/22  Yes [provider]  Multiple Vitamins-Minerals (CENTROVITE) TABS Take by mouth daily.   Yes [provider]  sodium chloride 1 g tablet Take 1 g by mouth 3 (three) times daily. Patient not taking: Reported on 12/28/2022    [provider]    Allergies  Allergen Reactions   Morphine Shortness Of Breath   Morphine And Related Anaphylaxis   Tessalon [Benzonatate] Hives    Hives, itching, burning all over.    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   Carpal tunnel syndrome on right 11/02/2017   MVP (mitral valve prolapse) 10/28/2017    Past Medical History:  Diagnosis Date   Anginal pain ([place]) 02/2019   related to MVP   Back pain    Mitral prolapse     Past Surgical History:  Procedure Laterality Date   ABDOMINAL HYSTERECTOMY     CESAREAN SECTION  03/30/2008   ROBOTIC ASSISTED TOTAL HYSTERECTOMY Bilateral 03/15/2019   Procedure: XI  ROBOTIC ASSISTED TOTAL HYSTERECTOMY with Bilateral Salpingectomy;  Surgeon: Delsa Bern, MD;  Location: [facility];  Service: Gynecology;  Laterality: Bilateral;   TUBAL LIGATION  2017    Social History   Socioeconomic History   Marital status: Single    Spouse name: Not on file   Number of children: Not on file   Years of education: Not on file   Highest education level: Master's degree (e.g., MA, MS, MEng, MEd, MSW, MBA)  Occupational History   Not on file  Tobacco Use   Smoking status: Never   Smokeless tobacco: Never  Vaping Use   Vaping Use: Never used  Substance and Sexual Activity   Alcohol use: Not Currently    Comment:     Drug use: No   Sexual activity: Never    Birth control/protection: Surgical  Other Topics Concern   Not on file  Social History Narrative   Not on file   Social Determinants of Health   Financial Resource Strain: Low Risk  (12/27/2022)   Overall Financial Resource Strain (CARDIA)    Difficulty of Paying Living Expenses: Not very hard  Food Insecurity: No Food Insecurity (12/27/2022)   Hunger Vital Sign    Worried About Running Out of Food in the Last Year: Never true    Ran Out of Food in the Last Year: Never true  Transportation Needs:  No Transportation Needs (12/27/2022)   PRAPARE - Hydrologist (Medical): No    Lack of Transportation (Non-Medical): No  Physical Activity: Insufficiently Active (12/27/2022)   Exercise Vital Sign    Days of Exercise per Week: 4 days    Minutes of Exercise per Session: 20 min  Stress: No Stress Concern Present (12/27/2022)   [place]    Feeling of Stress : Only a little  Social Connections: Moderately Isolated (12/27/2022)   Social Connection and Isolation Panel [NHANES]    Frequency of Communication with Friends and Family: More than three times a week    Frequency of Social Gatherings with  Friends and Family: Once a week    Attends Religious Services: Never    Marine scientist or Organizations: No    Attends Music therapist: Not on file    Marital Status: Married  Human resources officer Violence: Not on file    Family History  Problem Relation Age of Onset   Stroke Mother    Alzheimer's disease Father    Diabetes Maternal Grandmother    Heart disease Maternal Grandmother    Heart attack Maternal Grandmother    Diabetes Maternal Grandfather    Heart disease Maternal Grandfather    Alzheimer's disease Paternal Grandmother    Dementia Paternal Grandmother    Alzheimer's disease Paternal Grandfather    Dementia Paternal Grandfather    Alcohol abuse Paternal Grandfather      Review of Systems  Constitutional: Negative.  Negative for chills and fever.  HENT: Negative.  Negative for congestion and sore throat.   Respiratory: Negative.  Negative for cough and shortness of breath.   Cardiovascular: Negative.  Negative for chest pain and palpitations.  Gastrointestinal:  Negative for abdominal pain, nausea and vomiting.  Skin: Negative.  Negative for rash.  Neurological: Negative.  Negative for dizziness and headaches.  All other systems reviewed and are negative.   Vitals:   12/28/22 0843  BP: 116/86  Pulse: 75  Temp: 98.6 F (37 C)  SpO2: 100%    Physical Exam Constitutional:      Appearance: Normal appearance.  Eyes:     Extraocular Movements: Extraocular movements intact.  Cardiovascular:     Rate and Rhythm: Normal rate.  Pulmonary:     Effort: Pulmonary effort is normal.  Skin:    General: Skin is warm and dry.     Comments: Right axillary area: No erythema or bruising.  No fluctuation.  Small nontender palpable mass  Neurological:     Mental Status: She is alert and oriented to person, place, and time.  Psychiatric:        Mood and Affect: Mood normal.        Behavior: Behavior normal.      ASSESSMENT & PLAN: A total of 32  minutes was spent with the patient and counseling/coordination of care regarding preparing for this visit, review of most recent office visit notes, review of most recent blood work results, review of chronic conditions, differential diagnosis of right axillary mass and need for diagnostic workup including ultrasound, prognosis, documentation, need for follow-up.  Problem List Items Addressed This Visit       Other   Axillary mass, right - Primary    Clinically stable.  No red flag signs or symptoms No signs of infection. Recommend axillary ultrasound We will follow-up after that      Relevant Orders  [place] AXILLA RIGHT   Patient Instructions  Health Maintenance, Female Adopting a healthy lifestyle and getting preventive care are important in promoting health and wellness. Ask your health care provider about: The right schedule for you to have regular tests and exams. Things you can do on your own to prevent diseases and keep yourself healthy. What should I know about diet, weight, and exercise? Eat a healthy diet  Eat a diet that includes plenty of vegetables, fruits, low-fat dairy products, and lean protein. Do not eat a lot of foods that are high in solid fats, added sugars, or sodium. Maintain a healthy weight Body mass index (BMI) is used to identify weight problems. It estimates body fat based on height and weight. Your health care provider can help determine your BMI and help you achieve or maintain a healthy weight. Get regular exercise Get regular exercise. This is one of the most important things you can do for your health. Most adults should: Exercise for at least 150 minutes each week. The exercise should increase your heart rate and make you sweat (moderate-intensity exercise). Do strengthening exercises at least twice a week. This is in addition to the moderate-intensity exercise. Spend less time sitting. Even light physical activity can be beneficial. Watch cholesterol  and blood lipids Have your blood tested for lipids and cholesterol at 40 years of age, then have this test every 5 years. Have your cholesterol levels checked more often if: Your lipid or cholesterol levels are high. You are older than 40 years of age. You are at high risk for heart disease. What should I know about cancer screening? Depending on your health history and family history, you may need to have cancer screening at various ages. This may include screening for: Breast cancer. Cervical cancer. Colorectal cancer. Skin cancer. Lung cancer. What should I know about heart disease, diabetes, and high blood pressure? Blood pressure and heart disease High blood pressure causes heart disease and increases the risk of stroke. This is more likely to develop in people who have high blood pressure readings or are overweight. Have your blood pressure checked: Every 3-5 years if you are 50-31 years of age. Every year if you are 2 years old or older. Diabetes Have regular diabetes screenings. This checks your fasting blood sugar level. Have the screening done: Once every three years after age 64 if you are at a normal weight and have a low risk for diabetes. More often and at a younger age if you are overweight or have a high risk for diabetes. What should I know about preventing infection? Hepatitis B If you have a higher risk for hepatitis B, you should be screened for this virus. Talk with your health care provider to find out if you are at risk for hepatitis B infection. Hepatitis C Testing is recommended for: Everyone born from [birth year] through 1963. Anyone with known risk factors for hepatitis C. Sexually transmitted infections (STIs) Get screened for STIs, including gonorrhea and chlamydia, if: You are sexually active and are younger than 40 years of age. You are older than 40 years of age and your health care provider tells you that you are at risk for this type of infection. Your  sexual activity has changed since you were last screened, and you are at increased risk for chlamydia or gonorrhea. Ask your health care provider if you are at risk. Ask your health care provider about whether you are at high risk for HIV. Your health care provider  may recommend a prescription medicine to help prevent HIV infection. If you choose to take medicine to prevent HIV, you should first get tested for HIV. You should then be tested every 3 months for as long as you are taking the medicine. Pregnancy If you are about to stop having your period (premenopausal) and you may become pregnant, seek counseling before you get pregnant. Take 400 to 800 micrograms (mcg) of folic acid every day if you become pregnant. Ask for birth control (contraception) if you want to prevent pregnancy. Osteoporosis and menopause Osteoporosis is a disease in which the bones lose minerals and strength with aging. This can result in bone fractures. If you are 3 years old or older, or if you are at risk for osteoporosis and fractures, ask your health care provider if you should: Be screened for bone loss. Take a calcium or vitamin D supplement to lower your risk of fractures. Be given hormone replacement therapy (HRT) to treat symptoms of menopause. Follow these instructions at home: Alcohol use Do not drink alcohol if: Your health care provider tells you not to drink. You are pregnant, may be pregnant, or are planning to become pregnant. If you drink alcohol: Limit how much you have to: 0-1 drink a day. Know how much alcohol is in your drink. In the [place] one drink equals one 12 oz bottle of beer (355 mL), one 5 oz glass of wine (148 mL), or one 1 oz glass of hard liquor (44 mL). Lifestyle Do not use any products that contain nicotine or tobacco. These products include cigarettes, chewing tobacco, and vaping devices, such as e-cigarettes. If you need help quitting, ask your health care provider. Do not use  street drugs. Do not share needles. Ask your health care provider for help if you need support or information about quitting drugs. General instructions Schedule regular health, dental, and eye exams. Stay current with your vaccines. Tell your health care provider if: You often feel depressed. You have ever been abused or do not feel safe at home. Summary Adopting a healthy lifestyle and getting preventive care are important in promoting health and wellness. Follow your health care provider's instructions about healthy diet, exercising, and getting tested or screened for diseases. Follow your health care provider's instructions on monitoring your cholesterol and blood pressure. This information is not intended to replace advice given to you by your health care provider. Make sure you discuss any questions you have with your health care provider. Document Revised: 02/09/2021 Document Reviewed: 02/09/2021 Elsevier Patient Education  [place], MD [facility]

## 2022-12-28 NOTE — Patient Instructions (Signed)

## 2022-12-29 ENCOUNTER — Other Ambulatory Visit: Payer: Self-pay | Admitting: Emergency Medicine

## 2022-12-29 ENCOUNTER — Encounter: Payer: Self-pay | Admitting: Emergency Medicine

## 2022-12-29 DIAGNOSIS — R2231 Localized swelling, mass and lump, right upper limb: Secondary | ICD-10-CM

## 2022-12-30 ENCOUNTER — Other Ambulatory Visit: Payer: Self-pay | Admitting: *Deleted

## 2022-12-30 DIAGNOSIS — R2231 Localized swelling, mass and lump, right upper limb: Secondary | ICD-10-CM

## 2023-01-03 ENCOUNTER — Institutional Professional Consult (permissible substitution): Payer: BC Managed Care – PPO | Admitting: Neurology

## 2023-01-03 DIAGNOSIS — N6310 Unspecified lump in the right breast, unspecified quadrant: Secondary | ICD-10-CM | POA: Diagnosis not present

## 2023-01-04 ENCOUNTER — Encounter: Payer: BC Managed Care – PPO | Admitting: Nurse Practitioner

## 2023-01-06 ENCOUNTER — Telehealth: Payer: Self-pay | Admitting: Emergency Medicine

## 2023-01-06 NOTE — Telephone Encounter (Signed)
Called [facility] and they are going to fax over a order form for this patient

## 2023-01-06 NOTE — Telephone Encounter (Signed)
[facility] called stating they need the referral change to diagnostic mammogram and a breast ultra sound.  If any questions call [phone]

## 2023-01-07 DIAGNOSIS — R922 Inconclusive mammogram: Secondary | ICD-10-CM | POA: Diagnosis not present

## 2023-01-07 DIAGNOSIS — N6314 Unspecified lump in the right breast, lower inner quadrant: Secondary | ICD-10-CM | POA: Diagnosis not present

## 2023-01-10 ENCOUNTER — Other Ambulatory Visit: Payer: Self-pay | Admitting: Radiology

## 2023-01-10 DIAGNOSIS — C773 Secondary and unspecified malignant neoplasm of axilla and upper limb lymph nodes: Secondary | ICD-10-CM | POA: Diagnosis not present

## 2023-01-10 DIAGNOSIS — C50311 Malignant neoplasm of lower-inner quadrant of right female breast: Secondary | ICD-10-CM | POA: Diagnosis not present

## 2023-01-11 ENCOUNTER — Encounter: Payer: Self-pay | Admitting: *Deleted

## 2023-01-11 ENCOUNTER — Other Ambulatory Visit: Payer: Self-pay | Admitting: Radiology

## 2023-01-11 ENCOUNTER — Other Ambulatory Visit: Payer: Self-pay | Admitting: *Deleted

## 2023-01-11 DIAGNOSIS — R2231 Localized swelling, mass and lump, right upper limb: Secondary | ICD-10-CM

## 2023-01-11 DIAGNOSIS — C50911 Malignant neoplasm of unspecified site of right female breast: Secondary | ICD-10-CM

## 2023-01-11 NOTE — Telephone Encounter (Signed)
Recommend oncology referral.  Thanks.

## 2023-01-12 ENCOUNTER — Encounter: Payer: Self-pay | Admitting: Emergency Medicine

## 2023-01-12 ENCOUNTER — Telehealth: Payer: Self-pay | Admitting: Hematology and Oncology

## 2023-01-12 ENCOUNTER — Other Ambulatory Visit: Payer: BC Managed Care – PPO

## 2023-01-12 NOTE — Telephone Encounter (Signed)
scheduled per 4/9 referral , pt has been called and confirmed date and time. Pt is aware of location and to arrive early for check in   

## 2023-01-12 NOTE — Telephone Encounter (Signed)
Spoke to patient to confirm upcoming afternoon [facility] clinic appointment on 4/17, paperwork will be sent via e-mail.   Gave location and time, also informed patient that the surgeon's office would be calling as well to get information from them similar to the packet that they will be receiving so make sure to do both.  Reminded patient that all providers will be coming to the clinic to see them HERE and if they had any questions to not hesitate to reach back out to myself or their navigators.

## 2023-01-17 ENCOUNTER — Encounter: Payer: Self-pay | Admitting: *Deleted

## 2023-01-17 DIAGNOSIS — Z171 Estrogen receptor negative status [ER-]: Secondary | ICD-10-CM | POA: Insufficient documentation

## 2023-01-17 DIAGNOSIS — C50311 Malignant neoplasm of lower-inner quadrant of right female breast: Secondary | ICD-10-CM | POA: Insufficient documentation

## 2023-01-18 ENCOUNTER — Other Ambulatory Visit: Payer: Self-pay | Admitting: General Surgery

## 2023-01-18 NOTE — Progress Notes (Signed)
Radiation Oncology         [phone]) [phone] ________________________________  Initial Outpatient Consultation  Name: Suzanne Tucker MRN: [id number]  Date: 01/19/2023  DOB: 08/14/1981  XB:[id number], Eilleen Kempf, MD  [name], MD   REFERRING PHYSICIAN: [name], MD  DIAGNOSIS: No diagnosis found.   Cancer Staging  No matching staging information was found for the patient.  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 female who presented with a palpable right axillary lump x 2 weeks earlier this month. Subsequent bilateral diagnostic mammogram on 01/07/23 showed multiple rounded enlarged lymph nodes int he right axillary region with several possible asymmetries within the upper outer and inferior right breast. At the time of this examination, the patient was noted to report that her PCP palpated a second area under the right areola which she could not palpate herself.   Ultrasound of the right breast performed on that same date further demonstrated a 1.1 cm irregular mass in the 4 o'clock right breast at a posterior depth suspicious of malignancy. [place] also showed an adjacent satellite mass measuring 0.6 cm, located more superficial to the dominant mass, and multiple abnormal right axillary lymph nodes, the largest conglomerate of which measuring up to 4.5 cm.   Biopsy of the 4 o'clock right breast mass (located 3 cmfn) on date of 01/10/23 showed grade 3 invasive ductal carcinoma measuring 3.9 mm in the greatest linear extent of the sample.  ER status: 0% negative; PR status 0% negative; Proliferation marker Ki67 at 95%; Her2 status negative; Grade 3.  Biopsy of one of the abnormal right axillary lymph nodes also collected on 04/08 came back positive for metastatic carcinoma (without nodal  tissue present in the submitted specimen).   ***  PREVIOUS RADIATION THERAPY: {EXAM; YES/NO:19492::"No"}  PAST MEDICAL HISTORY:  has a past medical history of Anginal pain ([facility]) (02/2019), Back pain, and Mitral prolapse.    PAST SURGICAL HISTORY: Past Surgical History:  Procedure Laterality Date   ABDOMINAL HYSTERECTOMY     CESAREAN SECTION  03/30/2008   ROBOTIC ASSISTED TOTAL HYSTERECTOMY Bilateral 03/15/2019   Procedure: XI ROBOTIC ASSISTED TOTAL HYSTERECTOMY with Bilateral Salpingectomy;  Surgeon: Silverio Lay, MD;  Location: [facility];  Service: Gynecology;  Laterality: Bilateral;   TUBAL LIGATION  2017    FAMILY HISTORY: family history includes Alcohol abuse in her paternal grandfather; Alzheimer's disease in her father, paternal grandfather, and paternal grandmother; Dementia in her paternal grandfather and paternal grandmother; Diabetes in her maternal grandfather and maternal grandmother; Heart attack in her maternal grandmother; Heart disease in her maternal grandfather and maternal grandmother; Stroke in her mother.  SOCIAL HISTORY:  reports that she has never smoked. She has never used smokeless tobacco. She reports that she does not currently use alcohol. She reports that she does not use drugs.  ALLERGIES: Morphine, Morphine and related, Tessalon [benzonatate], Hydrocodone, Latex, Codeine, and Tramadol  MEDICATIONS:  Current Outpatient Medications  Medication Sig Dispense Refill   aspirin 81 MG chewable tablet Chew 81 mg by mouth daily.     fluticasone (FLONASE) 50 MCG/ACT nasal spray Place 2 sprays into both nostrils daily. 16 g 0   ibuprofen (ADVIL) 800 MG tablet Take 800 mg by mouth as needed for pain.     Multiple Vitamins-Minerals (CENTROVITE) TABS Take by mouth  daily.     sodium chloride 1 g tablet Take 1 g by mouth 3 (three) times daily. (Patient not taking: Reported on 12/28/2022)     No current facility-administered medications for this  encounter.    REVIEW OF SYSTEMS: As above in HPI.   PHYSICAL EXAM:  vitals were not taken for this visit.   General: Alert and oriented, in no acute distress HEENT: Head is normocephalic. Extraocular movements are intact. Oropharynx is clear. Neck: Neck is supple, no palpable cervical or supraclavicular lymphadenopathy. Heart: Regular in rate and rhythm with no murmurs, rubs, or gallops. Chest: Clear to auscultation bilaterally, with no rhonchi, wheezes, or rales. Abdomen: Soft, nontender, nondistended, with no rigidity or guarding. Extremities: No cyanosis or edema. Lymphatics: see Neck Exam Skin: No concerning lesions. Musculoskeletal: symmetric strength and muscle tone throughout. Neurologic: Cranial nerves II through XII are grossly intact. No obvious focalities. Speech is fluent. Coordination is intact. Psychiatric: Judgment and insight are intact. Affect is appropriate. Breasts: *** . No other palpable masses appreciated in the breasts or axillae *** .    ECOG = ***  0 - Asymptomatic (Fully active, able to carry on all predisease activities without restriction)  1 - Symptomatic but completely ambulatory (Restricted in physically strenuous activity but ambulatory and able to carry out work of a light or sedentary nature. For example, light housework, office work)  2 - Symptomatic, <50% in bed during the day (Ambulatory and capable of all self care but unable to carry out any work activities. Up and about more than 50% of waking hours)  3 - Symptomatic, >50% in bed, but not bedbound (Capable of only limited self-care, confined to bed or chair 50% or more of waking hours)  4 - Bedbound (Completely disabled. Cannot carry on any self-care. Totally confined to bed or chair)  5 - Death   Santiago Glad MM, Creech RH, Tormey DC, et al. [phone]). "Toxicity and response criteria of the [facility]". Am. Evlyn Clines. Oncol. 5 (6): 649-55   LABORATORY DATA:  Lab Results   Component Value Date   WBC 5.2 11/23/2022   HGB 12.5 11/23/2022   HCT 36.8 11/23/2022   MCV 88.5 11/23/2022   PLT 286.0 11/23/2022   CMP     Component Value Date/Time   NA 137 11/23/2022 1529   NA 140 04/08/2022 1037   K 4.1 11/23/2022 1529   CL 103 11/23/2022 1529   CO2 28 11/23/2022 1529   GLUCOSE 99 11/23/2022 1529   BUN 10 11/23/2022 1529   BUN 9 04/08/2022 1037   CREATININE 0.64 11/23/2022 1529   CALCIUM 9.2 11/23/2022 1529   PROT 7.0 11/23/2022 1529   PROT 6.5 04/08/2022 1037   ALBUMIN 4.0 11/23/2022 1529   ALBUMIN 4.3 04/08/2022 1037   AST 18 11/23/2022 1529   ALT 10 11/23/2022 1529   ALKPHOS 55 11/23/2022 1529   BILITOT 0.3 11/23/2022 1529   BILITOT 0.4 04/08/2022 1037   GFRNONAA 114 11/04/2020 1208   GFRAA 132 11/04/2020 1208         RADIOGRAPHY: CARDIAC EVENT MONITOR  Result Date: 12/23/2022 Indication:palpitations with known PVCs Duration: 30d Findings HR  avg 78  Min 50-Max 156 PVCs Rare, less than 1% PACs Rare, less than 1% SVT Nonsustained 1brief   Symptoms:  Chest pain>> sinus ( at a HR of 130)  flutters>> mostly with sinus, on one occasion the above short run of atrial tachycardia was detected, and one PVC was also detected Triggered:  Conclusions: No serious arrhythmia Many symptoms were assoc with sinus rhtyhm, perhaps because the HR was on the faster side ( 90s); she did feel at least one of her PVCs and the short atrial run-- clearly she is very aware of her Heart I tried to call her to see if she had any of her "aggressive" palpitaitons, left VM Recommendations Nothing at this point      IMPRESSION/PLAN: ***   It was a pleasure meeting the patient today. We discussed the risks, benefits, and side effects of radiotherapy. I recommend radiotherapy to the *** to reduce her risk of locoregional recurrence by 2/3.  We discussed that radiation would take approximately *** weeks to complete and that I would give the patient a few weeks to heal following  surgery before starting treatment planning. *** If chemotherapy were to be given, this would precede radiotherapy. We spoke about acute effects including skin irritation and fatigue as well as much less common late effects including internal organ injury or irritation. We spoke about the latest technology that is used to minimize the risk of late effects for patients undergoing radiotherapy to the breast or chest wall. No guarantees of treatment were given. The patient is enthusiastic about proceeding with treatment. I look forward to participating in the patient's care.  I will await her referral back to me for postoperative follow-up and eventual CT simulation/treatment planning.  On date of service, in total, I spent *** minutes on this encounter. Patient was seen in person.   __________________________________________   Lonie Peak, MD  This document serves as a record of services personally performed by Lonie Peak, MD. It was created on her behalf by Neena Rhymes, a trained medical scribe. The creation of this record is based on the scribe's personal observations and the provider's statements to them. This document has been checked and approved by the attending provider.

## 2023-01-19 ENCOUNTER — Encounter: Payer: Self-pay | Admitting: General Practice

## 2023-01-19 ENCOUNTER — Other Ambulatory Visit: Payer: Self-pay

## 2023-01-19 ENCOUNTER — Encounter: Payer: Self-pay | Admitting: Radiation Oncology

## 2023-01-19 ENCOUNTER — Encounter: Payer: Self-pay | Admitting: Physical Therapy

## 2023-01-19 ENCOUNTER — Inpatient Hospital Stay (HOSPITAL_BASED_OUTPATIENT_CLINIC_OR_DEPARTMENT_OTHER): Payer: BC Managed Care – PPO | Admitting: Genetic Counselor

## 2023-01-19 ENCOUNTER — Inpatient Hospital Stay: Payer: BC Managed Care – PPO

## 2023-01-19 ENCOUNTER — Encounter: Payer: Self-pay | Admitting: Hematology and Oncology

## 2023-01-19 ENCOUNTER — Inpatient Hospital Stay: Payer: BC Managed Care – PPO | Attending: Hematology and Oncology | Admitting: Hematology and Oncology

## 2023-01-19 ENCOUNTER — Ambulatory Visit
Admission: RE | Admit: 2023-01-19 | Discharge: 2023-01-19 | Disposition: A | Discharge status: Home / Self Care | Payer: BC Managed Care – PPO | Source: Ambulatory Visit | Attending: Radiology | Admitting: Radiology

## 2023-01-19 ENCOUNTER — Ambulatory Visit
Admission: RE | Admit: 2023-01-19 | Discharge: 2023-01-19 | Disposition: A | Discharge status: Home / Self Care | Payer: BC Managed Care – PPO | Source: Ambulatory Visit | Attending: Radiation Oncology | Admitting: Radiation Oncology

## 2023-01-19 ENCOUNTER — Ambulatory Visit: Payer: BC Managed Care – PPO | Attending: General Surgery | Admitting: Physical Therapy

## 2023-01-19 VITALS — BP 134/75 | HR 106 | Temp 97.7°F | Resp 18 | Ht 61.0 in | Wt 149.0 lb

## 2023-01-19 DIAGNOSIS — Z9079 Acquired absence of other genital organ(s): Secondary | ICD-10-CM | POA: Diagnosis not present

## 2023-01-19 DIAGNOSIS — C50911 Malignant neoplasm of unspecified site of right female breast: Secondary | ICD-10-CM

## 2023-01-19 DIAGNOSIS — Z79899 Other long term (current) drug therapy: Secondary | ICD-10-CM | POA: Diagnosis not present

## 2023-01-19 DIAGNOSIS — Z5111 Encounter for antineoplastic chemotherapy: Secondary | ICD-10-CM | POA: Insufficient documentation

## 2023-01-19 DIAGNOSIS — Z90710 Acquired absence of both cervix and uterus: Secondary | ICD-10-CM | POA: Insufficient documentation

## 2023-01-19 DIAGNOSIS — C50311 Malignant neoplasm of lower-inner quadrant of right female breast: Secondary | ICD-10-CM | POA: Insufficient documentation

## 2023-01-19 DIAGNOSIS — Z171 Estrogen receptor negative status [ER-]: Secondary | ICD-10-CM | POA: Insufficient documentation

## 2023-01-19 DIAGNOSIS — R293 Abnormal posture: Secondary | ICD-10-CM | POA: Insufficient documentation

## 2023-01-19 DIAGNOSIS — C773 Secondary and unspecified malignant neoplasm of axilla and upper limb lymph nodes: Secondary | ICD-10-CM | POA: Diagnosis not present

## 2023-01-19 LAB — GENETIC SCREENING ORDER

## 2023-01-19 LAB — CBC WITH DIFFERENTIAL (CANCER CENTER ONLY)
Abs Immature Granulocytes: 0.02 10*3/uL (ref 0.00–0.07)
Basophils Absolute: 0 10*3/uL (ref 0.0–0.1)
Basophils Relative: 0 %
Eosinophils Absolute: 0 10*3/uL (ref 0.0–0.5)
Eosinophils Relative: 0 %
HCT: 36.9 % (ref 36.0–46.0)
Hemoglobin: 12.9 g/dL (ref 12.0–15.0)
Immature Granulocytes: 0 %
Lymphocytes Relative: 18 %
Lymphs Abs: 1.2 10*3/uL (ref 0.7–4.0)
MCH: 30.4 pg (ref 26.0–34.0)
MCHC: 35 g/dL (ref 30.0–36.0)
MCV: 86.8 fL (ref 80.0–100.0)
Monocytes Absolute: 0.4 10*3/uL (ref 0.1–1.0)
Monocytes Relative: 6 %
Neutro Abs: 5 10*3/uL (ref 1.7–7.7)
Neutrophils Relative %: 76 %
Platelet Count: 268 10*3/uL (ref 150–400)
RBC: 4.25 MIL/uL (ref 3.87–5.11)
RDW: 12 % (ref 11.5–15.5)
WBC Count: 6.5 10*3/uL (ref 4.0–10.5)
nRBC: 0 % (ref 0.0–0.2)

## 2023-01-19 LAB — CMP (CANCER CENTER ONLY)
ALT: 9 U/L (ref 0–44)
AST: 18 U/L (ref 15–41)
Albumin: 4.5 g/dL (ref 3.5–5.0)
Alkaline Phosphatase: 56 U/L (ref 38–126)
Anion gap: 7 (ref 5–15)
BUN: 7 mg/dL (ref 6–20)
CO2: 28 mmol/L (ref 22–32)
Calcium: 9.6 mg/dL (ref 8.9–10.3)
Chloride: 103 mmol/L (ref 98–111)
Creatinine: 0.68 mg/dL (ref 0.44–1.00)
GFR, Estimated: >60 mL/min (ref >60)
Glucose, Bld: 104 mg/dL — ABNORMAL HIGH (ref 70–99)
Potassium: 3.3 mmol/L — ABNORMAL LOW (ref 3.5–5.1)
Sodium: 138 mmol/L (ref 135–145)
Total Bilirubin: 0.6 mg/dL (ref 0.3–1.2)
Total Protein: 7.7 g/dL (ref 6.5–8.1)

## 2023-01-19 MED ORDER — DEXAMETHASONE 4 MG PO TABS
4.0000 mg | ORAL_TABLET | Freq: Every day | ORAL | 1 refills | Status: DC
Start: 2023-01-19 — End: 2023-04-29

## 2023-01-19 MED ORDER — ALPRAZOLAM 0.25 MG PO TABS: 0.2500 mg | ORAL_TABLET | Freq: Every evening | ORAL | 0 refills | Status: DC | PRN

## 2023-01-19 MED ORDER — GADOPICLENOL 0.5 MMOL/ML IV SOLN
7.0000 mL | Freq: Once | INTRAVENOUS | Status: AC | PRN
  Administered 2023-01-19: 7 mL via INTRAVENOUS

## 2023-01-19 MED ORDER — PROCHLORPERAZINE MALEATE 10 MG PO TABS
10.0000 mg | ORAL_TABLET | Freq: Four times a day (QID) | ORAL | 1 refills | Status: DC | PRN
Start: 2023-01-19 — End: 2023-07-15

## 2023-01-19 MED ORDER — LIDOCAINE-PRILOCAINE 2.5-2.5 % EX CREA
TOPICAL_CREAM | CUTANEOUS | 3 refills | Status: DC
Start: 2023-01-19 — End: 2023-04-29

## 2023-01-19 MED ORDER — ONDANSETRON HCL 8 MG PO TABS
8.0000 mg | ORAL_TABLET | Freq: Three times a day (TID) | ORAL | 1 refills | Status: DC | PRN
Start: 2023-01-19 — End: 2023-09-08

## 2023-01-19 NOTE — Research (Signed)
Trial:  [employer] 2021-05 - Specimen Collection Study to Evaluate Biomarkers in Subjects with Cancer  [name] was identified by Dr [name] as a potential candidate for the above listed study.  This Clinical Research Nurse met with Suzanne Tucker, [id number], on 01/19/23 in a manner and location that ensures patient privacy to discuss participation in the above listed research study.  Patient is Accompanied by her husband .  A copy of the informed consent document and separate HIPAA Authorization was provided to the patient.  Patient reads, speaks, and understands [place].   Patient was provided with the business card of this Nurse and encouraged to contact the research team with any questions.  Approximately 10 minutes were spent with the patient reviewing the informed consent documents.  Patient was provided the option of taking informed consent documents home to review and was encouraged to review at their convenience with their support network, including other care providers. Patient took the consent documents home to review. Plan follow up by phone 01/26/23 in the afternoon. Margret Chance Nitesh Pitstick, RN, BSN, [facility] She  Her  Hers Clinical Research Nurse [facility] Direct Dial [phone]  Pager [phone] 01/19/2023 4:19 PM

## 2023-01-19 NOTE — Progress Notes (Signed)
REFERRING PROVIDER: Serena Croissant, MD [address] [place],  [place] [address]  PRIMARY PROVIDER:  Georgina Quint, MD  PRIMARY REASON FOR VISIT:  1. Malignant neoplasm of lower-inner quadrant of right breast of female, estrogen receptor negative    HISTORY OF PRESENT ILLNESS:   Suzanne Tucker, a 42 y.o. female, was seen for a [facility] cancer genetics consultation at the request of Dr. [name] due to a personal history of breast cancer.  Suzanne Tucker presents to clinic today to discuss the possibility of a hereditary predisposition to cancer, to discuss genetic testing, and to further clarify her future cancer risks, as well as potential cancer risks for family members.   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 The treatment plan is pending.   CANCER HISTORY:  Oncology History  Malignant neoplasm of lower-inner quadrant of right breast of female, estrogen receptor negative  01/10/2023 Initial Diagnosis   Palpable lump right axilla x 2 weeks bulky axillary lymphadenopathy ultrasound breast: 2 masses at 4 o'clock position 1.1 cm and 0.6 cm multiple axillary lymph nodes largest 4.5 cm also level 2 and 3 (subpectoral lymph node as well): Biopsy: Grade 3 IDC ER 0%, PR 0%, Ki-67 95%, HER2 1+   01/19/2023 Cancer Staging   Staging form: Breast, AJCC 8th Edition - Clinical: Stage IIB (cT1c, cN1, cM0, G3, ER-, PR-, HER2-) - Signed by Serena Croissant, MD on 01/19/2023 Histologic grading system: 3 grade system     Past Medical History:  Diagnosis Date   Anginal pain 02/2019   related to MVP   Back pain    Breast cancer    Mitral prolapse     Past Surgical History:  Procedure Laterality Date   ABDOMINAL HYSTERECTOMY     CESAREAN SECTION  03/30/2008   ROBOTIC ASSISTED TOTAL HYSTERECTOMY Bilateral 03/15/2019   Procedure: XI ROBOTIC ASSISTED TOTAL HYSTERECTOMY with Bilateral Salpingectomy;  Surgeon: Silverio Lay, MD;   Location: [facility];  Service: Gynecology;  Laterality: Bilateral;   TUBAL LIGATION  2017    FAMILY HISTORY:  We obtained a detailed, 4-generation family history.  She did not report any known family history of cancer.      Suzanne Tucker is unaware of previous family history of genetic testing for hereditary cancer risks. There is no reported Ashkenazi Jewish ancestry. There is no known consanguinity.  GENETIC COUNSELING ASSESSMENT: Suzanne Tucker is a 42 y.o. female with a personal history of breast cancer which is somewhat suggestive of a hereditary cancer syndrome given her age of diagnosis and her triple negative status. We, therefore, discussed and recommended the following at today's visit.   DISCUSSION: We discussed that 5 - 10% of cancer is hereditary.  Most cases of hereditary breast cancers are associated with mutations in BRCA1/2.  There are other genes that can be associated with hereditary breast cancer syndromes.  We discussed that testing is beneficial for several reasons including knowing how to follow individuals for their cancer risks, identifying whether potential treatment options, such as PARP inhibitors, would be beneficial, and understanding if other family members could be at risk for cancer and allowing them to undergo genetic testing.   We reviewed the characteristics, features and inheritance patterns of hereditary cancer syndromes. We also discussed genetic testing, including the appropriate family members to test, the process of testing, insurance coverage and turn-around-time for results. We discussed the implications of a negative, positive, carrier and/or variant of  uncertain significant result. We recommended Suzanne Tucker pursue genetic testing for a panel that includes genes associated with breast cancer and other common cancers.    The [facility] Custom Common Hereditary Cancers + RNA Panel includes sequencing, deletion/duplication, and RNA analysis of the  following 37 genes: APC, ATM, AXIN2, BARD1, BMPR1A, BRCA1, BRCA2, BRIP1, CDH1, CDK4*, CDKN2A*, CHEK2, CTNNA1, DICER1, EPCAM, FH, GREM1*, HOXB13*, MBD4*, MLH1, MSH2, MSH3, MSH6, MUTYH, NF1, NTHL1, PALB2, PMS2, POLD1, POLE, PTEN, RAD51C, RAD51D, SMAD4, SMARCA4, STK11, TP53.  *Genes without RNA analysis.   Based on Ms. Even's personal history of triple negative breast cancer at age 67, she meets medical criteria for genetic testing. Despite that she meets criteria, she may still have an out of pocket cost. We discussed that if her out of pocket cost for testing is over $100, the laboratory should contact her and discuss the self-pay prices and/or patient pay assistance programs.    PLAN: After considering the risks, benefits, and limitations, Suzanne Tucker provided informed consent to pursue genetic testing and the blood sample was sent to [facility] for analysis of the Custom Common Hereditary Cancers +RNA Panel. Results should be available within approximately 2-3 weeks' time, at which point they will be disclosed by telephone to Suzanne Tucker, as will any additional recommendations warranted by these results. Suzanne Tucker will receive a summary of her genetic counseling visit and a copy of her results once available. This information will also be available in Epic.   Suzanne Tucker questions were answered to her satisfaction today. Our contact information was provided should additional questions or concerns arise. Thank you for the referral and allowing [place] to share in the care of your patient.   Deionte Spivack M. Rennie Plowman, MS, [facility] Genetic Counselor [email or web address] [phone]) [phone]  The patient was seen for a total of 15 minutes in face-to-face genetic counseling.  She was accompanied by her husband, [name].  Dr. [name] available to discuss this case as needed.    _______________________________________________________________________ For Office Staff:  Number of people involved in session:  2 Was an Intern/ student involved with case: no

## 2023-01-19 NOTE — Progress Notes (Signed)
START ON PATHWAY REGIMEN - Breast     Cycles 1 through 4: A cycle is every 21 days:     Pembrolizumab      Paclitaxel      Carboplatin      Filgrastim-xxxx    Cycles 5 through 8: A cycle is every 21 days:     Pembrolizumab      Doxorubicin      Cyclophosphamide      Pegfilgrastim-xxxx   **Always confirm dose/schedule in your pharmacy ordering system**  Patient Characteristics: Preoperative or Nonsurgical Candidate, M0 (Clinical Staging), Up to cT4c, Any N, M0, Neoadjuvant Therapy followed by Surgery, Invasive Disease, Chemotherapy, HER2 Negative, ER Negative, Platinum Therapy Indicated and Candidate for Checkpoint Inhibitor Therapeutic Status: Preoperative or Nonsurgical Candidate, M0 (Clinical Staging) AJCC M Category: cM0 AJCC Grade: G3 ER Status: Negative (-) AJCC 8 Stage Grouping: IIIB HER2 Status: Negative (-) AJCC T Category: cT2 AJCC N Category: cN1 PR Status: Negative (-) Breast Surgical Plan: Neoadjuvant Therapy followed by Surgery Intent of Therapy: Curative Intent, Discussed with Patient

## 2023-01-19 NOTE — Research (Signed)
Trial:  [id number] [place] [name] was identified by Dr [name] as a potential candidate for the above listed study.  This Clinical Research Nurse met with Suzanne Tucker, [id number], on 01/19/23 in a manner and location that ensures patient privacy to discuss participation in the above listed research study.  Patient is Accompanied by her husband .  A copy of the informed consent document and separate HIPAA Authorization was provided to the patient.  Patient reads, speaks, and understands [place].   Patient was provided with the business card of this Nurse and encouraged to contact the research team with any questions.  Approximately 5 minutes were spent with the patient reviewing the informed consent documents.  Patient was provided the option of taking informed consent documents home to review and was encouraged to review at their convenience with their support network, including other care providers. Patient took the consent documents home to review. Treatment plan is not clear at this time, as patient is awaiting more imaging. Plan follow up by phone next week.  Suzanne Tucker Suzanne Parekh, RN, BSN, [facility] She  Her  Hers Clinical Research Nurse [facility] Direct Dial [phone]  Pager [phone] 01/19/2023 4:22 PM

## 2023-01-19 NOTE — Research (Signed)
G2542, ICE COMPRESS: RANDOMIZED TRIAL OF LIMB CRYOCOMPRESSION VERSUS CONTINUOUS COMPRESSION VERSUS LOW CYCLIC COMPRESSION FOR THE PREVENTION  OF TAXANE-INDUCED PERIPHERAL NEUROPATHY  [name] was identified by Dr [name] as a potential candidate for the above listed study.  This Clinical Research Nurse met with Suzanne Tucker, [id number], on 01/19/23 in a manner and location that ensures patient privacy to discuss participation in the above listed research study.  Patient is Accompanied by her husband .  A copy of the informed consent document and separate HIPAA Authorization was provided to the patient.  Patient reads, speaks, and understands [place].   Patient was provided with the business card of this Nurse and encouraged to contact the research team with any questions.  Approximately 5 minutes were spent with the patient reviewing the informed consent documents.  Patient was provided the option of taking informed consent documents home to review and was encouraged to review at their convenience with their support network, including other care providers. Patient took the consent documents home to review.  Treatment plan is not set yet, and patient is pending more imaging. Plan follow up by phone next week.  Suzanne Chance Arthur Aydelotte, RN, BSN, [facility] She  Her  Hers Clinical Research Nurse [facility] Direct Dial [phone]  Pager [phone] 01/19/2023 4:20 PM

## 2023-01-19 NOTE — Therapy (Signed)
OUTPATIENT PHYSICAL THERAPY BREAST CANCER BASELINE EVALUATION   Patient Name: Suzanne Tucker MRN: [id number] DOB:[birth year], 42 y.o., female Today's Date: 01/19/2023  END OF SESSION:  PT End of Session - 01/19/23 1323     Visit Number 1    Number of Visits 2    Date for PT Re-Evaluation 07/21/23    PT Start Time 1353    PT Stop Time 1401   Also saw pt from 1457 to 1526 for a total of 37 minutes   PT Time Calculation (min) 8 min    Activity Tolerance Patient tolerated treatment well    Behavior During Therapy [facility] for tasks assessed/performed             Past Medical History:  Diagnosis Date   Anginal pain 02/2019   related to MVP   Back pain    Breast cancer    Mitral prolapse    Past Surgical History:  Procedure Laterality Date   ABDOMINAL HYSTERECTOMY     CESAREAN SECTION  03/30/2008   ROBOTIC ASSISTED TOTAL HYSTERECTOMY Bilateral 03/15/2019   Procedure: XI ROBOTIC ASSISTED TOTAL HYSTERECTOMY with Bilateral Salpingectomy;  Surgeon: Silverio Lay, MD;  Location: [facility];  Service: Gynecology;  Laterality: Bilateral;   TUBAL LIGATION  2017   Patient Active Problem List   Diagnosis Date Noted   Malignant neoplasm of lower-inner quadrant of right breast of female, estrogen receptor negative 01/17/2023   Axillary mass, right 12/28/2022   Palpitations 11/23/2022   Dizzy spells 03/04/2022   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 [name]  REFERRING DIAG: Right breast cancer  THERAPY DIAG:  Malignant neoplasm of lower-inner quadrant of right breast of female, estrogen receptor negative  Abnormal posture  Rationale for Evaluation and Treatment: Rehabilitation  ONSET DATE: 01/07/2023  SUBJECTIVE:                                                                                                                                                                                            SUBJECTIVE STATEMENT: Patient reports she is here today to be seen by her medical team for her newly diagnosed right breast cancer.   PERTINENT HISTORY:  Patient was diagnosed on 01/07/2023 with right grade 3 invasive ductal carcinoma breast cancer. It measures 2.2 cm and is located in the lower inner quadrant. 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 She has a positive axillary node and multiple bulky appearing lymph nodes on MRI which are levels I, II, and III.  PATIENT GOALS:  reduce lymphedema risk and learn post op HEP.   PAIN:  Are you having pain? Yes - left upper arm for the past week; pain mostly at night and is unchanged with activity  PRECAUTIONS: Active CA   HAND DOMINANCE: right  WEIGHT BEARING RESTRICTIONS: No  FALLS:  Has patient fallen in last 6 months? No  LIVING ENVIRONMENT: Patient lives with: her husband and 44 y.o. daughter Lives in: House/apartment Has following equipment at home: None  OCCUPATION: Runner, broadcasting/film/video  LEISURE: She does not exercise  PRIOR LEVEL OF FUNCTION: Independent   OBJECTIVE:  COGNITION: Overall cognitive status: Within functional limits for tasks assessed    POSTURE:  Forward head and rounded shoulders posture  UPPER EXTREMITY AROM/PROM:  A/PROM RIGHT   eval   Shoulder extension 53  Shoulder flexion 157  Shoulder abduction 159  Shoulder internal rotation 71  Shoulder external rotation 86    (Blank rows = not tested)  A/PROM LEFT   eval  Shoulder extension 43  Shoulder flexion 150  Shoulder abduction 160  Shoulder internal rotation 72  Shoulder external rotation 76    (Blank rows = not tested)  CERVICAL AROM: All within normal limits  UPPER EXTREMITY STRENGTH: WFL  LYMPHEDEMA ASSESSMENTS:   LANDMARK RIGHT   eval  10 cm proximal to olecranon process 28.5  Olecranon process 24.1  10 cm proximal to ulnar styloid process 23.6  Just proximal to ulnar styloid process  15.6  Across hand at thumb web space 17.7  At base of 2nd digit 5.9  (Blank rows = not tested)  LANDMARK LEFT   eval  10 cm proximal to olecranon process 28.6  Olecranon process 23.8  10 cm proximal to ulnar styloid process 21.9  Just proximal to ulnar styloid process 15.4  Across hand at thumb web space 17.7  At base of 2nd digit 5.6  (Blank rows = not tested)  L-DEX LYMPHEDEMA SCREENING:  The patient was assessed using the L-Dex machine today to produce a lymphedema index baseline score. The patient will be reassessed on a regular basis (typically every 3 months) to obtain new L-Dex scores. If the score is > 6.5 points away from his/her baseline score indicating onset of subclinical lymphedema, it will be recommended to wear a compression garment for 4 weeks, 12 hours per day and then be reassessed. If the score continues to be > 6.5 points from baseline at reassessment, we will initiate lymphedema treatment. Assessing in this manner has a 95% rate of preventing clinically significant lymphedema.   L-DEX FLOWSHEETS - 01/19/23 1300       L-DEX LYMPHEDEMA SCREENING   Measurement Type Unilateral    L-DEX MEASUREMENT EXTREMITY Upper Extremity    POSITION  Standing    DOMINANT SIDE Right    At Risk Side Right    BASELINE SCORE (UNILATERAL) 4.6             QUICK DASH SURVEY:  [name] - 01/19/23 0001     Open a tight or new jar Mild difficulty    Do heavy household chores (wash walls, wash floors) Mild difficulty    Carry a shopping bag or briefcase No difficulty    Wash your back No difficulty    Use a knife to cut food No difficulty    Recreational activities in which you take some force or impact through your arm, shoulder, or hand (golf, hammering, tennis) Mild difficulty    During the past week, to what extent has your  arm, shoulder or hand problem interfered with your normal social activities with family, friends, neighbors, or groups? Slightly    During the past  week, to what extent has your arm, shoulder or hand problem limited your work or other regular daily activities Modererately    Arm, shoulder, or hand pain. Mild    Tingling (pins and needles) in your arm, shoulder, or hand Mild    Difficulty Sleeping Mild difficulty    DASH Score 20.45 %              PATIENT EDUCATION:  Education details: Lymphedema risk reduction and post op shoulder/posture HEP Person educated: Patient Education method: Explanation, Demonstration, Handout Education comprehension: Patient verbalized understanding and returned demonstration  HOME EXERCISE PROGRAM: Patient was instructed today in a home exercise program today for post op shoulder range of motion. These included active assist shoulder flexion in sitting, scapular retraction, wall walking with shoulder abduction, and hands behind head external rotation.  She was encouraged to do these twice a day, holding 3 seconds and repeating 5 times when permitted by her physician.   ASSESSMENT:  CLINICAL IMPRESSION: Patient was diagnosed on 01/07/2023 with right grade 3 invasive ductal carcinoma breast cancer. It measures 2.2 cm and is located in the lower inner quadrant. 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 She has a positive axillary node and multiple bulky appearing lymph nodes on MRI which are levels I, II, and III. Her multidisciplinary medical team met prior to her assessments to determine a recommended treatment plan. She is planning to have neoadjuvant chemotherapy followed by a right lumpectomy and axillary lymph node dissection, and radiation. She will benefit from a post op PT reassessment to determine needs and from L-Dex screens every 3 months for 2 years to detect subclinical lymphedema.  Pt will benefit from skilled therapeutic intervention to improve on the following deficits: Decreased knowledge of precautions, impaired UE functional use, pain, decreased ROM, postural dysfunction.   PT  treatment/interventions: ADL/self-care home management, pt/family education, therapeutic exercise  REHAB POTENTIAL: Excellent  CLINICAL DECISION MAKING: Stable/uncomplicated  EVALUATION COMPLEXITY: Low   GOALS: Goals reviewed with patient? YES  LONG TERM GOALS: (STG=LTG)    Name Target Date Goal status  1 Pt will be able to verbalize understanding of pertinent lymphedema risk reduction practices relevant to her dx specifically related to skin care.  Baseline:  No knowledge 01/19/2023 Achieved at eval  2 Pt will be able to return demo and/or verbalize understanding of the post op HEP related to regaining shoulder ROM. Baseline:  No knowledge 01/19/2023 Achieved at eval  3 Pt will be able to verbalize understanding of the importance of attending the post op After Breast CA Class for further lymphedema risk reduction education and therapeutic exercise.  Baseline:  No knowledge 01/19/2023 Achieved at eval  4 Pt will demo she has regained full shoulder ROM and function post operatively compared to baselines.  Baseline: See objective measurements taken today. 07/21/2023     PLAN:  PT FREQUENCY/DURATION: EVAL and 1 follow up appointment.   PLAN FOR NEXT SESSION: will reassess 3-4 weeks post op to determine needs.   Patient will follow up at outpatient cancer rehab 3-4 weeks following surgery.  If the patient requires physical therapy at that time, a specific plan will be dictated and sent to the referring physician for approval. The patient was educated today on appropriate basic range of motion exercises to begin post operatively and the importance of attending the  After Breast Cancer class following surgery.  Patient was educated today on lymphedema risk reduction practices as it pertains to recommendations that will benefit the patient immediately following surgery.  She verbalized good understanding.    Physical Therapy Information for After Breast Cancer Surgery/Treatment:  Lymphedema  is a swelling condition that you may be at risk for in your arm if you have lymph nodes removed from the armpit area.  After a sentinel node biopsy, the risk is approximately 5-9% and is higher after an axillary node dissection.  There is treatment available for this condition and it is not life-threatening.  Contact your physician or physical therapist with concerns. You may begin the 4 shoulder/posture exercises (see additional sheet) when permitted by your physician (typically a week after surgery).  If you have drains, you may need to wait until those are removed before beginning range of motion exercises.  A general recommendation is to not lift your arms above shoulder height until drains are removed.  These exercises should be done to your tolerance and gently.  This is not a "no pain/no gain" type of recovery so listen to your body and stretch into the range of motion that you can tolerate, stopping if you have pain.  If you are having immediate reconstruction, ask your plastic surgeon about doing exercises as he or she may want you to wait. We encourage you to attend the free one time ABC (After Breast Cancer) class offered by [facility].  You will learn information related to lymphedema risk, prevention and treatment and additional exercises to regain mobility following surgery.  You can call [phone] for more information.  This is offered the 1st and 3rd Monday of each month.  You only attend the class one time. While undergoing any medical procedure or treatment, try to avoid blood pressure being taken or needle sticks from occurring on the arm on the side of cancer.   This recommendation begins after surgery and continues for the rest of your life.  This may help reduce your risk of getting lymphedema (swelling in your arm). An excellent resource for those seeking information on lymphedema is the National Lymphedema Network's web site. It can be accessed at  www.lymphnet.org If you notice swelling in your hand, arm or breast at any time following surgery (even if it is many years from now), please contact your doctor or physical therapist to discuss this.  Lymphedema can be treated at any time but it is easier for you if it is treated early on.  If you feel like your shoulder motion is not returning to normal in a reasonable amount of time, please contact your surgeon or physical therapist.  [facility] [phone]. [address], [place] [address]  ABC CLASS After Breast Cancer Class  After Breast Cancer Class is a specially designed exercise class to assist you in a safe recover after having breast cancer surgery.  In this class you will learn how to get back to full function whether your drains were just removed or if you had surgery a month ago.  This one-time class is held the 1st and 3rd Monday of every month from 11:00 a.m. until 12:00 noon virtually.  This class is FREE and space is limited. For more information or to register for the next available class, call [phone].  Class Goals  Understand specific stretches to improve the flexibility of you chest and shoulder. Learn ways to  safely strengthen your upper body and improve your posture. Understand the warning signs of infection and why you may be at risk for an arm infection. Learn about Lymphedema and prevention.  ** You do not attend this class until after surgery.  Drains must be removed to participate  Patient was instructed today in a home exercise program today for post op shoulder range of motion. These included active assist shoulder flexion in sitting, scapular retraction, wall walking with shoulder abduction, and hands behind head external rotation.  She was encouraged to do these twice a day, holding 3 seconds and repeating 5 times when permitted by her physician.  Bethann Punches, [place] 01/19/23 3:34 PM

## 2023-01-19 NOTE — Progress Notes (Signed)
[facility] CONSULT NOTE  Patient Care Team: Georgina Quint, MD as PCP - General (Internal Medicine) Corky Crafts, MD as PCP - Cardiology (Cardiology) Serena Croissant, MD as Consulting Physician (Hematology and Oncology) Lonie Peak, MD as Attending Physician (Radiation Oncology) [name], MD as Consulting Physician (General Surgery) Donnelly Angelica, RN as Oncology Nurse Navigator Pershing Proud, RN as Oncology Nurse Navigator  CHIEF COMPLAINTS/PURPOSE OF CONSULTATION:  Newly diagnosed breast cancer  HISTORY OF PRESENTING ILLNESS:  Suzanne Tucker 42 y.o. female is here because of recent diagnosis of right breast cancer.  Patient felt a lump in the right axilla but she was taking a shower and it hide been slowly growing in size and that she subsequently underwent a mammogram ultrasound and a biopsy of the breast and the lymph node.  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  She is here today accompanied by her husband to discuss her treatment plan.  She has felt a right cervical lymph node as well as the right chest wall swelling.  She is complaining of intermittent dizziness.  I reviewed her records extensively and collaborated the history with the patient.  SUMMARY OF ONCOLOGIC HISTORY: Oncology History  Malignant neoplasm of lower-inner quadrant of right breast of female, estrogen receptor negative  01/10/2023 Initial Diagnosis   Palpable lump right axilla x 2 weeks bulky axillary lymphadenopathy ultrasound breast: 2 masses at 4 o'clock position 1.1 cm and 0.6 cm multiple axillary lymph nodes largest 4.5 cm also level 2 and 3 (subpectoral lymph node as well): Biopsy: Grade 3 IDC ER 0%, PR 0%, Ki-67 95%, HER2 1+      MEDICAL HISTORY:  Past Medical History:  Diagnosis Date   Anginal pain 02/2019   related to MVP   Back pain    Breast cancer    Mitral prolapse     SURGICAL HISTORY: Past Surgical History:  Procedure  Laterality Date   ABDOMINAL HYSTERECTOMY     CESAREAN SECTION  03/30/2008   ROBOTIC ASSISTED TOTAL HYSTERECTOMY Bilateral 03/15/2019   Procedure: XI ROBOTIC ASSISTED TOTAL HYSTERECTOMY with Bilateral Salpingectomy;  Surgeon: Silverio Lay, MD;  Location: [facility];  Service: Gynecology;  Laterality: Bilateral;   TUBAL LIGATION  2017    SOCIAL HISTORY: Social History   Socioeconomic History   Marital status: Single    Spouse name: Not on file   Number of children: Not on file   Years of education: Not on file   Highest education level: Master's degree (e.g., MA, MS, MEng, MEd, MSW, MBA)  Occupational History   Not on file  Tobacco Use   Smoking status: Never   Smokeless tobacco: Never  Vaping Use   Vaping Use: Never used  Substance and Sexual Activity   Alcohol use: Not Currently    Comment:     Drug use: No   Sexual activity: Never    Birth control/protection: Surgical  Other Topics Concern   Not on file  Social History Narrative   Not on file   Social Determinants of Health   Financial Resource Strain: Low Risk  (12/27/2022)   Overall Financial Resource Strain (CARDIA)    Difficulty of Paying Living Expenses: Not very hard  Food Insecurity: No Food Insecurity (12/27/2022)   Hunger Vital Sign    Worried About Running Out of Food in the Last Year: Never true    Ran Out of Food in the Last Year: Never true  Transportation  Needs: No Transportation Needs (12/27/2022)   PRAPARE - Administrator, Civil Service (Medical): No    Lack of Transportation (Non-Medical): No  Physical Activity: Insufficiently Active (12/27/2022)   Exercise Vital Sign    Days of Exercise per Week: 4 days    Minutes of Exercise per Session: 20 min  Stress: No Stress Concern Present (12/27/2022)   [employer] of Occupational Health - Occupational Stress Questionnaire    Feeling of Stress : Only a little  Social Connections: Moderately Isolated (12/27/2022)   Social  Connection and Isolation Panel [NHANES]    Frequency of Communication with Friends and Family: More than three times a week    Frequency of Social Gatherings with Friends and Family: Once a week    Attends Religious Services: Never    Database administrator or Organizations: No    Attends Engineer, structural: Not on file    Marital Status: Married  Catering manager Violence: Not on file    FAMILY HISTORY: Family History  Problem Relation Age of Onset   Stroke Mother    Alzheimer's disease Father    Diabetes Maternal Grandmother    Heart disease Maternal Grandmother    Heart attack Maternal Grandmother    Diabetes Maternal Grandfather    Heart disease Maternal Grandfather    Alzheimer's disease Paternal Grandmother    Dementia Paternal Grandmother    Alzheimer's disease Paternal Grandfather    Dementia Paternal Grandfather    Alcohol abuse Paternal Grandfather     ALLERGIES:  is allergic to morphine, morphine and related, tessalon [benzonatate], hydrocodone, latex, codeine, and tramadol.  MEDICATIONS:  Current Outpatient Medications  Medication Sig Dispense Refill   aspirin EC 81 MG tablet Take 81 mg by mouth daily. Swallow whole.     fluticasone (FLONASE) 50 MCG/ACT nasal spray Place 2 sprays into both nostrils daily. 16 g 0   Multiple Vitamins-Minerals (CENTROVITE) TABS Take by mouth daily.     aspirin 81 MG chewable tablet Chew 81 mg by mouth daily.     ibuprofen (ADVIL) 800 MG tablet Take 800 mg by mouth as needed for pain.     sodium chloride 1 g tablet Take 1 g by mouth 3 (three) times daily. (Patient not taking: Reported on 12/28/2022)     No current facility-administered medications for this visit.    REVIEW OF SYSTEMS:   Constitutional: Denies fevers, chills or abnormal night sweats Breast: Right axillary lymphadenopathy All other systems were reviewed with the patient and are negative.  PHYSICAL EXAMINATION: ECOG PERFORMANCE STATUS: 1 - Symptomatic  but completely ambulatory  Vitals:   01/19/23 1233  BP: 134/75  Pulse: (!) 106  Resp: 18  Temp: 97.7 F (36.5 C)  SpO2: 100%   Filed Weights   01/19/23 1233  Weight: 149 lb (67.6 kg)    GENERAL:alert, no distress and comfortable BREAST: Right axillary lymphadenopathy (exam performed in the presence of a chaperone)   LABORATORY DATA:  I have reviewed the data as listed Lab Results  Component Value Date   WBC 6.5 01/19/2023   HGB 12.9 01/19/2023   HCT 36.9 01/19/2023   MCV 86.8 01/19/2023   PLT 268 01/19/2023   Lab Results  Component Value Date   NA 138 01/19/2023   K 3.3 (L) 01/19/2023   CL 103 01/19/2023   CO2 28 01/19/2023    RADIOGRAPHIC STUDIES: I have personally reviewed the radiological reports and agreed with the findings in the report.  ASSESSMENT AND PLAN:  Malignant neoplasm of lower-inner quadrant of right breast of female, estrogen receptor negative 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%, HER2 1+  Pathology and radiology counseling: Discussed with the patient, the details of pathology including the type of breast cancer,the clinical staging, the significance of ER, PR and HER-2/neu receptors and the implications for treatment. After reviewing the pathology in detail, we proceeded to discuss the different treatment options between surgery, radiation, chemotherapy, antiestrogen therapies.  Treatment plan: Palpable right cervical lymph node: We will plan to obtain a PET CT scan. Dizziness: We will obtain a brain MRI.  Depending on the results of the PET scan our goals of treatment might change from curative intent treatment versus palliative intent of treatment.  If there is no extensive distant metastatic  disease, our plan is Neoadjuvant chemotherapy with Taxol Harrell Gave weekly x 12 followed by Adriamycin Cytoxan Keytruda followed by Rande Lawman maintenance Breast conserving surgery with ALND Adjuvant radiation therapy  Chemotherapy Counseling: I discussed the risks and benefits of chemotherapy including the risks of nausea/ vomiting, risk of infection from low WBC count, fatigue due to chemo or anemia, bruising or bleeding due to low platelets, mouth sores, loss/ change in taste and decreased appetite. Liver and kidney function will be monitored through out chemotherapy as abnormalities in liver and kidney function may be a side effect of treatment. Cardiac dysfunction due to Adriamycin neuropathy risk from Taxol were discussed in detail. Risk of permanent bone marrow dysfunction and leukemia due to chemo were also discussed.  Plan: Port placement, echocardiogram prior to Adriamycin, chemo class  However if there is distant metastatic disease, our goals of treatment would be palliation. If there is only oligometastatic disease then we can still pursue aggressive neoadjuvant chemo and consider doing  All questions were answered. The patient knows to call the clinic with any problems, questions or concerns.    Tamsen Meek, MD 01/19/23

## 2023-01-19 NOTE — Assessment & Plan Note (Addendum)
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%, HER2 1+  Pathology and radiology counseling: Discussed with the patient, the details of pathology including the type of breast cancer,the clinical staging, the significance of ER, PR and HER-2/neu receptors and the implications for treatment. After reviewing the pathology in detail, we proceeded to discuss the different treatment options between surgery, radiation, chemotherapy, antiestrogen therapies.  Treatment plan: Neoadjuvant chemotherapy with Taxol Harrell Gave weekly x 12 followed by Adriamycin Cytoxan Keytruda followed by Rande Lawman maintenance Breast conserving surgery with ALND Adjuvant radiation therapy  Chemotherapy Counseling: I discussed the risks and benefits of chemotherapy including the risks of nausea/ vomiting, risk of infection from low WBC count, fatigue due to chemo or anemia, bruising or bleeding due to low platelets, mouth sores, loss/ change in taste and decreased appetite. Liver and kidney function will be monitored through out chemotherapy as abnormalities in liver and kidney function may be a side effect of treatment. Cardiac dysfunction due to Adriamycin neuropathy risk from Taxol were discussed in detail. Risk of permanent bone marrow dysfunction and leukemia due to chemo were also discussed.  Plan: Port placement, echocardiogram prior to Adriamycin, chemo class Return to clinic in 2 weeks to start chemo

## 2023-01-19 NOTE — Progress Notes (Signed)
[facility] Psychosocial Distress Screening Spiritual Care  Met with Suzanne Tucker and her husband Suzanne Tucker in [facility] to introduce Support Center team/resources, reviewing distress screen per protocol.  The patient scored a 5 on the Psychosocial Distress Thermometer which indicates moderate distress. Also assessed for distress and other psychosocial needs.      01/19/2023    4:16 PM  ONCBCN DISTRESS SCREENING  Screening Type Initial Screening  Distress experienced in past week (1-10) 5  Family Problem type Other (comment)  Emotional problem type Adjusting to illness;Feeling hopeless  Information Concerns Type Lack of info about diagnosis;Lack of info about treatment  Physical Problem type Pain;Nausea/vomiting;Sleep/insomnia;Constipation/diarrhea  Referral to support programs Yes    Chaplain and patient discussed common feelings and emotions when being diagnosed with cancer, and the importance of support during treatment.  Chaplain informed patient of the support team and support services at [facility].  Chaplain provided contact information and encouraged patient to call with any questions or concerns.  During this encounter, Suzanne Tucker's top concern was how best to support her daughter Suzanne Tucker, age 28. She has already enrolled Suzanne Tucker in [place] and plans to investigate [employer] as an additional source of support resources for youth and families dealing with serious illness.  Follow up needed: Yes.  Given how tired couple was at the close of [facility], we plan to follow up by phone late next week to speak in more detail after Suzanne Tucker should know the scan results.   [address] [place], [place], [facility] Pager [phone] Voicemail [phone]

## 2023-01-20 ENCOUNTER — Encounter: Payer: Self-pay | Admitting: Hematology and Oncology

## 2023-01-21 ENCOUNTER — Telehealth: Payer: Self-pay | Admitting: Hematology and Oncology

## 2023-01-21 ENCOUNTER — Other Ambulatory Visit: Payer: Self-pay

## 2023-01-21 NOTE — Telephone Encounter (Signed)
Scheduled appointments per WQ. Patient is aware of all made appointments. 

## 2023-01-22 ENCOUNTER — Ambulatory Visit (HOSPITAL_COMMUNITY)
Admission: RE | Admit: 2023-01-22 | Discharge: 2023-01-22 | Disposition: A | Discharge status: Home / Self Care | Payer: BC Managed Care – PPO | Source: Ambulatory Visit | Attending: Hematology and Oncology | Admitting: Hematology and Oncology

## 2023-01-22 DIAGNOSIS — Z171 Estrogen receptor negative status [ER-]: Secondary | ICD-10-CM | POA: Diagnosis not present

## 2023-01-22 DIAGNOSIS — C50919 Malignant neoplasm of unspecified site of unspecified female breast: Secondary | ICD-10-CM | POA: Diagnosis not present

## 2023-01-22 DIAGNOSIS — C50311 Malignant neoplasm of lower-inner quadrant of right female breast: Secondary | ICD-10-CM | POA: Diagnosis not present

## 2023-01-22 MED ORDER — GADOBUTROL 1 MMOL/ML IV SOLN
7.0000 mL | Freq: Once | INTRAVENOUS | Status: AC | PRN
  Administered 2023-01-22: 7 mL via INTRAVENOUS

## 2023-01-23 ENCOUNTER — Ambulatory Visit
Admission: EM | Admit: 2023-01-23 | Discharge: 2023-01-23 | Disposition: A | Discharge status: Home / Self Care | Payer: BC Managed Care – PPO | Attending: Internal Medicine | Admitting: Internal Medicine

## 2023-01-23 DIAGNOSIS — R509 Fever, unspecified: Secondary | ICD-10-CM | POA: Diagnosis not present

## 2023-01-23 NOTE — ED Triage Notes (Signed)
Pt presents with c/o fever since last night and nausea and diarrhea X 2 weeks.  States she has taken tylenol to reduce fever, no relief.

## 2023-01-23 NOTE — Patient Instructions (Signed)
SURGICAL WAITING ROOM VISITATION Patients having surgery or a procedure may have no more than 2 support people in the waiting area - these visitors may rotate in the visitor waiting room.   Due to an increase in RSV and influenza rates and associated hospitalizations, children ages 50 and under may not visit patients in [facility] hospitals. If the patient needs to stay at the hospital during part of their recovery, the visitor guidelines for inpatient rooms apply.  PRE-OP VISITATION  Pre-op nurse will coordinate an appropriate time for 1 support person to accompany the patient in pre-op.  This support person may not rotate.  This visitor will be contacted when the time is appropriate for the visitor to come back in the pre-op area.  Please refer to the [facility] website for the visitor guidelines for Inpatients (after your surgery is over and you are in a regular room).  You are not required to quarantine at this time prior to your surgery. However, you must do this: Hand Hygiene often Do NOT share personal items Notify your provider if you are in close contact with someone who has COVID or you develop fever 100.4 or greater, new onset of sneezing, cough, sore throat, shortness of breath or body aches.  If you test positive for Covid or have been in contact with anyone that has tested positive in the last 10 days please notify you surgeon.    Your procedure is scheduled on:  THursday  January 27, 2023  Report to [facility] Main Entrance: Leota Jacobsen entrance where the [employer] is available.   Report to admitting at: 05:15    AM  +++++Call this number if you have any questions or problems the morning of surgery [phone]  DO NOT EAT OR DRINK ANYTHING AFTER MIDNIGHT THE NIGHT PRIOR TO YOUR SURGERY / PROCEDURE.   FOLLOW BOWEL PREP AND ANY ADDITIONAL PRE OP INSTRUCTIONS YOU RECEIVED FROM YOUR SURGEON'S OFFICE!!!   Oral Hygiene is also important to reduce your risk of  infection.        Remember - BRUSH YOUR TEETH THE MORNING OF SURGERY WITH YOUR REGULAR TOOTHPASTE  Do NOT smoke after Midnight the night before surgery.  Take ONLY these medicines the morning of surgery with A SIP OF WATER: ???   If You have been diagnosed with Sleep Apnea - Bring CPAP mask and tubing day of surgery. We will provide you with a CPAP machine on the day of your surgery.                   You may not have any metal on your body including hair pins, jewelry, and body piercing  Do not wear make-up, lotions, powders, perfumes or deodorant  Do not wear nail polish including gel and S&S, artificial / acrylic nails, or any other type of covering on natural nails including finger and toenails. If you have artificial nails, gel coating, etc., that needs to be removed by a nail salon, Please have this removed prior to surgery. Not doing so may mean that your surgery could be cancelled or delayed if the Surgeon or anesthesia staff feels like they are unable to monitor you safely.   Do not shave 48 hours prior to surgery to avoid nicks in your skin which may contribute to postoperative infections.    Contacts, Hearing Aids, dentures or bridgework may not be worn into surgery. DENTURES WILL BE REMOVED PRIOR TO SURGERY PLEASE DO NOT APPLY "Poly grip" OR  ADHESIVES!!!  Patients discharged on the day of surgery will not be allowed to drive home.  Someone NEEDS to stay with you for the first 24 hours after anesthesia.  Do not bring your home medications to the hospital. The Pharmacy will dispense medications listed on your medication list to you during your admission in the Hospital.  Special Instructions: Bring a copy of your healthcare power of attorney and living will documents the day of surgery, if you wish to have them scanned into your [facility] Records- EPIC  Please read over the following fact sheets you were given: IF YOU HAVE QUESTIONS ABOUT YOUR PRE-OP INSTRUCTIONS, PLEASE  CALL [phone].   [place] - Preparing for Surgery Before surgery, you can play an important role.  Because skin is not sterile, your skin needs to be as free of germs as possible.  You can reduce the number of germs on your skin by washing with CHG (chlorahexidine gluconate) soap before surgery.  CHG is an antiseptic cleaner which kills germs and bonds with the skin to continue killing germs even after washing. Please DO NOT use if you have an allergy to CHG or antibacterial soaps.  If your skin becomes reddened/irritated stop using the CHG and inform your nurse when you arrive at Short Stay. Do not shave (including legs and underarms) for at least 48 hours prior to the first CHG shower.  You may shave your face/neck.  Please follow these instructions carefully:  1.  Shower with CHG Soap the night before surgery and the  morning of surgery.  2.  If you choose to wash your hair, wash your hair first as usual with your normal  shampoo.  3.  After you shampoo, rinse your hair and body thoroughly to remove the shampoo.                             4.  Use CHG as you would any other liquid soap.  You can apply chg directly to the skin and wash.  Gently with a scrungie or clean washcloth.  5.  Apply the CHG Soap to your body ONLY FROM THE NECK DOWN.   Do not use on face/ open                           Wound or open sores. Avoid contact with eyes, ears mouth and genitals (private parts).                       Wash face,  Genitals (private parts) with your normal soap.             6.  Wash thoroughly, paying special attention to the area where your  surgery  will be performed.  7.  Thoroughly rinse your body with warm water from the neck down.  8.  DO NOT shower/wash with your normal soap after using and rinsing off the CHG Soap.            9.  Pat yourself dry with a clean towel.            10.  Wear clean pajamas.            11.  Place clean sheets on your bed the night of your first shower and  do not  sleep with pets.  ON THE DAY OF SURGERY : Do not apply  any lotions/deodorants the morning of surgery.  Please wear clean clothes to the hospital/surgery center.    FAILURE TO FOLLOW THESE INSTRUCTIONS MAY RESULT IN THE CANCELLATION OF YOUR SURGERY  PATIENT SIGNATURE_________________________________  NURSE SIGNATURE__________________________________  ________________________________________________________________________

## 2023-01-23 NOTE — Discharge Instructions (Signed)
Take Tylenol or ibuprofen as needed for fever or pain Please maintain adequate hydration There is no indication for COVID or flu testing Return to urgent care or follow-up with your oncologist if you have any other concerns.

## 2023-01-23 NOTE — Progress Notes (Addendum)
COVID Vaccine received:  []  No [x]  Yes Date of any COVID positive Test in last 90 days:  None  PCP - Kimberlee Nearing, MD Cardiologist - Everette Rank, MD  Oncology - Serena Croissant, MD   pt in research study   Chest x-ray - 08-10-2022  Epic EKG -  11-15-2022  Epic Stress Test -  ECHO - 10-22-2020  Epic Cardiac Cath -  30-day Monitor-  12-23-2022 Epic   PCR screen: []  Ordered & Completed           []   No Order but Needs PROFEND           [x]   N/A for this surgery  Surgery Plan:  [x]  Ambulatory                            []  Outpatient in bed                            []  Admit  Anesthesia:    [x]  General  []  Spinal                           []   Choice []   MAC  Pacemaker / ICD device [x]  No []  Yes   Spinal Cord Stimulator:[x]  No []  Yes       History of Sleep Apnea? [x]  No []  Yes   CPAP used?- [x]  No []  Yes    Does the patient monitor blood sugar?          []  No []  Yes  [x]  N/A  Patient has: [x]  NO Hx DM   []  Pre-DM                 []  DM1  []   DM2  Blood Thinner / Instructions: none Aspirin Instructions: ASA 81 mg stopped taking 2 weeks ago.   ERAS Protocol Ordered: [x]  No  []  Yes Patient is to be NPO after: midnight prior  Activity level: Patient is able to climb a flight of stairs without difficulty [x]  No CP  [x]  No SOB. Patient can perform ADLs without assistance.   Anesthesia review: MVP, Palps (PVCs)  Patient denies shortness of breath, fever, cough and chest pain at PAT appointment.  Patient verbalized understanding and agreement to the Pre-Surgical Instructions that were given to them at this PAT appointment. Patient was also educated of the need to review these PAT instructions again prior to her surgery.I reviewed the appropriate phone numbers to call if they have any and questions or concerns.

## 2023-01-23 NOTE — ED Provider Notes (Signed)
[facility]    CSN: [id number] Arrival date & time: 01/23/23  1237      History   Chief Complaint Chief Complaint  Patient presents with   Fever   Fatigue   Diarrhea    HPI Suzanne Tucker is a 42 y.o. 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  Patient's symptoms have been ongoing for the past week.  Fever is between 99 and 100.2 Fahrenheit.  She has some sore throat but night sweats.  She has had nausea and loose stool over the past couple of weeks.  No abdominal pain or distention.  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  No dizziness, near syncope or syncopal episodes.  No generalized body aches.  No significant weight loss. HPI  Past Medical History:  Diagnosis Date   Anginal pain 02/2019   related to MVP   Back pain    Breast cancer    Mitral prolapse     Patient Active Problem List   Diagnosis Date Noted   Malignant neoplasm of lower-inner quadrant of right breast of female, estrogen receptor negative 01/17/2023   Axillary mass, right 12/28/2022   Palpitations 11/23/2022   Dizzy spells 03/04/2022   Chronic pansinusitis 12/13/2017   Carpal tunnel syndrome on right 11/02/2017   MVP (mitral valve prolapse) 10/28/2017    Past Surgical History:  Procedure Laterality Date   ABDOMINAL HYSTERECTOMY     CESAREAN SECTION  03/30/2008   ROBOTIC ASSISTED TOTAL HYSTERECTOMY Bilateral 03/15/2019   Procedure: XI ROBOTIC ASSISTED TOTAL HYSTERECTOMY with Bilateral Salpingectomy;  Surgeon: Silverio Lay, MD;  Location: [facility];  Service: Gynecology;  Laterality: Bilateral;   TUBAL LIGATION  2017    OB History   No obstetric history on file.      Home Medications    Prior to Admission medications   Medication Sig Start Date End Date Taking? Authorizing Provider  ALPRAZolam (XANAX) 0.25 MG  tablet Take 1 tablet (0.25 mg total) by mouth at bedtime as needed for anxiety. 01/19/23   Serena Croissant, MD  aspirin 81 MG chewable tablet Chew 81 mg by mouth daily.    [provider]  aspirin EC 81 MG tablet Take 81 mg by mouth daily. Swallow whole.    [provider]  dexamethasone (DECADRON) 4 MG tablet Take 1 tablet (4 mg total) by mouth daily. Take with Adriamycin 1 tablet day after chemo and 1 tablet 2 days after chemo with food 01/19/23   Serena Croissant, MD  fluticasone (FLONASE) 50 MCG/ACT nasal spray Place 2 sprays into both nostrils daily. 12/02/22   Leath-Warren, Sadie Haber, NP  ibuprofen (ADVIL) 800 MG tablet Take 800 mg by mouth as needed for pain. 02/05/22   [provider]  lidocaine-prilocaine (EMLA) cream Apply to affected area once 01/19/23   Serena Croissant, MD  Multiple Vitamins-Minerals (CENTROVITE) TABS Take by mouth daily.    [provider]  ondansetron (ZOFRAN) 8 MG tablet Take 1 tablet (8 mg total) by mouth every 8 (eight) hours as needed for nausea or vomiting. Start on the third day after chemotherapy. 01/19/23   Serena Croissant, MD  prochlorperazine (COMPAZINE) 10 MG tablet Take 1 tablet (10 mg total) by mouth every 6 (six) hours as needed for nausea or vomiting. 01/19/23   Serena Croissant, MD  sodium chloride 1 g tablet Take 1  g by mouth 3 (three) times daily. Patient not taking: Reported on 12/28/2022    [provider]    Family History Family History  Problem Relation Age of Onset   Stroke Mother    Alzheimer's disease Father    Diabetes Maternal Grandmother    Heart disease Maternal Grandmother    Heart attack Maternal Grandmother    Diabetes Maternal Grandfather    Heart disease Maternal Grandfather    Alzheimer's disease Paternal Grandmother    Dementia Paternal Grandmother    Alzheimer's disease Paternal Grandfather    Dementia Paternal Grandfather    Alcohol abuse Paternal Grandfather     Social History Social  History   Tobacco Use   Smoking status: Never   Smokeless tobacco: Never  Vaping Use   Vaping Use: Never used  Substance Use Topics   Alcohol use: Not Currently    Comment:     Drug use: No     Allergies   Morphine, Morphine and related, Tessalon [benzonatate], Hydrocodone, Latex, Codeine, and Tramadol   Review of Systems Review of Systems As per HPI  Physical Exam Triage Vital Signs ED Triage Vitals [01/23/23 1331]  Enc Vitals Group     BP 123/80     Pulse Rate 81     Resp 17     Temp 99 F (37.2 C)     Temp Source Oral     SpO2 98 %     Weight      Height      Head Circumference      Peak Flow      Pain Score      Pain Loc      Pain Edu?      Excl. in GC?    No data found.  Updated Vital Signs BP 123/80   Pulse 81   Temp 99 F (37.2 C) (Oral)   Resp 17   SpO2 98%   Visual Acuity Right Eye Distance:   Left Eye Distance:   Bilateral Distance:    Right Eye Near:   Left Eye Near:    Bilateral Near:     Physical Exam Vitals and nursing note reviewed.  Constitutional:      General: She is in acute distress.     Appearance: She is not ill-appearing.  HENT:     Right Ear: Tympanic membrane normal.     Left Ear: Tympanic membrane normal.     Nose: Nose normal.     Mouth/Throat:     Mouth: Mucous membranes are moist.     Pharynx: No oropharyngeal exudate or posterior oropharyngeal erythema.  Cardiovascular:     Rate and Rhythm: Normal rate and regular rhythm.     Pulses: Normal pulses.     Heart sounds: Normal heart sounds.  Pulmonary:     Effort: Pulmonary effort is normal.     Breath sounds: Normal breath sounds.  Abdominal:     General: Bowel sounds are normal.     Palpations: Abdomen is soft.  Neurological:     Mental Status: She is alert.      UC Treatments / Results  Labs (all labs ordered are listed, but only abnormal results are displayed) Labs Reviewed - No data to display  EKG   Radiology MR Brain W Wo  Contrast  Result Date: 01/22/2023 CLINICAL DATA:  42 year old female recently diagnosed with breast cancer, metastatic lymph node. Staging. EXAM: MRI HEAD WITHOUT AND WITH CONTRAST TECHNIQUE: Multiplanar, multiecho pulse  sequences of the brain and surrounding structures were obtained without and with intravenous contrast. CONTRAST:  7mL GADAVIST GADOBUTROL 1 MMOL/ML IV SOLN COMPARISON:  Previous brain MRI 11/08/2017. FINDINGS: Brain: No abnormal enhancement identified. No midline shift, mass effect, or evidence of intracranial mass lesion. No dural thickening. Normal cerebral volume. No restricted diffusion to suggest acute infarction. No ventriculomegaly, extra-axial collection or acute intracranial hemorrhage. Cervicomedullary junction and pituitary are within normal limits. Wallace Cullens and white matter signal remains normal for age throughout the brain. No encephalomalacia or chronic cerebral blood products identified. Vascular: Major intracranial vascular flow voids are stable. Distal left vertebral artery is dominant. Major dural venous sinuses are enhancing and appear patent. Skull and upper cervical spine: Visualized bone marrow signal is within normal limits. Negative visible cervical spine and spinal cord. Sinuses/Orbits: Stable and negative orbits. Paranasal sinuses now clear. Other: Mastoids are clear. Visible internal auditory structures appear normal. Negative visible scalp and face. IMPRESSION: No metastatic disease or acute intracranial abnormality. Stable and normal MRI appearance of the Brain. Electronically Signed   By: Odessa Fleming M.D.   On: 01/22/2023 08:44    Procedures Procedures (including critical care time)  Medications Ordered in UC Medications - No data to display  Initial Impression / Assessment and Plan / UC Course  I have reviewed the triage vital signs and the nursing notes.  Pertinent labs & imaging results that were available during my care of the patient were reviewed by me and  considered in my medical decision making (see chart for details).     1.  Low-grade fever of 1 week duration: This may be related to underlying disease process or may be related to a viral illness Given the duration of symptoms, there is no indication for COVID or flu testing. Patient is advised to stay hydrated, take ibuprofen as needed for fever and follow-up with her oncologist. Patient is scheduled to have Mediport placed sometime this week. No indication for chest x-ray because her lung exam was clear. Return precautions given Final Clinical Impressions(s) / UC Diagnoses   Final diagnoses:  Low grade fever     Discharge Instructions      Take Tylenol or ibuprofen as needed for fever or pain Please maintain adequate hydration There is no indication for COVID or flu testing Return to urgent care or follow-up with your oncologist if you have any other concerns.   ED Prescriptions   None    PDMP not reviewed this encounter.   Merrilee Jansky, MD 01/23/23 [phone]

## 2023-01-24 ENCOUNTER — Encounter: Payer: Self-pay | Admitting: General Practice

## 2023-01-24 ENCOUNTER — Other Ambulatory Visit (HOSPITAL_COMMUNITY): Payer: BC Managed Care – PPO

## 2023-01-24 ENCOUNTER — Telehealth: Payer: Self-pay

## 2023-01-24 NOTE — Telephone Encounter (Signed)
SHORTER ANTHRACYCLINE-FREE CHEMO IMMUNOTHERAPY ADAPTED TO PATHOLOGICAL RESPONSE IN EARLY TRIPLE NEGATIVE BREAST CANCER (SCARLET), A RANDOMIZED PHASE III STUDY  [employer] 2021-05 - Specimen Collection Study to Evaluate Biomarkers in Subjects with Cancer   S2205, ICE COMPRESS: RANDOMIZED TRIAL OF LIMB CRYOCOMPRESSION VERSUS CONTINUOUS COMPRESSION VERSUS LOW CYCLIC COMPRESSION FOR THE PREVENTION  OF TAXANE-INDUCED PERIPHERAL NEUROPATHY   Tried to reach Suzanne Tucker several times today to provide update on which studies she is eligible for; unable to leave a message. Plan to try to catch her when she is here tomorrow for PET if I am unable to get her on the phone before then.  Margret Chance Doreena Maulden, RN, BSN, [facility] She  Her  Hers Clinical Research Nurse [facility] Direct Dial [phone]  Pager [phone] 01/24/2023 4:44 PM

## 2023-01-24 NOTE — Progress Notes (Signed)
[facility] Spiritual Care Note  Reached Suzanne Tucker by phone for follow-up support. She was so pleased to be remembered and cared for, as she notes her feelings have been going up and down so much with her brain MRI this weekend and scan tomorrow. We plan to speak more tomorrow afternoon after the scan for further support.   [address] Rush Barer, [place], [facility] Pager [phone] Voicemail [phone]

## 2023-01-25 ENCOUNTER — Other Ambulatory Visit: Payer: Self-pay

## 2023-01-25 ENCOUNTER — Encounter (HOSPITAL_COMMUNITY): Payer: Self-pay

## 2023-01-25 ENCOUNTER — Encounter: Payer: Self-pay | Admitting: General Practice

## 2023-01-25 ENCOUNTER — Encounter (HOSPITAL_COMMUNITY)
Admission: RE | Admit: 2023-01-25 | Discharge: 2023-01-25 | Disposition: A | Discharge status: Home / Self Care | Payer: BC Managed Care – PPO | Source: Ambulatory Visit | Attending: General Surgery | Admitting: General Surgery

## 2023-01-25 ENCOUNTER — Encounter (HOSPITAL_COMMUNITY)
Admission: RE | Admit: 2023-01-25 | Discharge: 2023-01-25 | Disposition: A | Discharge status: Home / Self Care | Payer: BC Managed Care – PPO | Source: Ambulatory Visit | Attending: Hematology and Oncology | Admitting: Hematology and Oncology

## 2023-01-25 ENCOUNTER — Encounter: Payer: Self-pay | Admitting: *Deleted

## 2023-01-25 VITALS — BP 124/77 | HR 80 | Temp 99.2°F | Resp 18 | Ht 61.0 in | Wt 145.0 lb

## 2023-01-25 DIAGNOSIS — Z853 Personal history of malignant neoplasm of breast: Secondary | ICD-10-CM | POA: Insufficient documentation

## 2023-01-25 DIAGNOSIS — C50311 Malignant neoplasm of lower-inner quadrant of right female breast: Secondary | ICD-10-CM

## 2023-01-25 DIAGNOSIS — I341 Nonrheumatic mitral (valve) prolapse: Secondary | ICD-10-CM

## 2023-01-25 DIAGNOSIS — C50911 Malignant neoplasm of unspecified site of right female breast: Secondary | ICD-10-CM | POA: Insufficient documentation

## 2023-01-25 DIAGNOSIS — Z01818 Encounter for other preprocedural examination: Secondary | ICD-10-CM

## 2023-01-25 DIAGNOSIS — Z171 Estrogen receptor negative status [ER-]: Secondary | ICD-10-CM | POA: Insufficient documentation

## 2023-01-25 DIAGNOSIS — Z452 Encounter for adjustment and management of vascular access device: Secondary | ICD-10-CM | POA: Diagnosis not present

## 2023-01-25 DIAGNOSIS — C773 Secondary and unspecified malignant neoplasm of axilla and upper limb lymph nodes: Secondary | ICD-10-CM | POA: Diagnosis not present

## 2023-01-25 DIAGNOSIS — C50919 Malignant neoplasm of unspecified site of unspecified female breast: Secondary | ICD-10-CM | POA: Diagnosis not present

## 2023-01-25 HISTORY — DX: Cardiac arrhythmia, unspecified: I49.9

## 2023-01-25 HISTORY — DX: Depression, unspecified: F32.A

## 2023-01-25 LAB — CBC
HCT: 38.3 % (ref 36.0–46.0)
Hemoglobin: 12.6 g/dL (ref 12.0–15.0)
MCH: 29.4 pg (ref 26.0–34.0)
MCHC: 32.9 g/dL (ref 30.0–36.0)
MCV: 89.3 fL (ref 80.0–100.0)
Platelets: 263 10*3/uL (ref 150–400)
RBC: 4.29 MIL/uL (ref 3.87–5.11)
RDW: 12.4 % (ref 11.5–15.5)
WBC: 6.9 10*3/uL (ref 4.0–10.5)
nRBC: 0 % (ref 0.0–0.2)

## 2023-01-25 LAB — BASIC METABOLIC PANEL
Anion gap: 8 (ref 5–15)
BUN: 7 mg/dL (ref 6–20)
CO2: 25 mmol/L (ref 22–32)
Calcium: 9.3 mg/dL (ref 8.9–10.3)
Chloride: 105 mmol/L (ref 98–111)
Creatinine, Ser: 0.64 mg/dL (ref 0.44–1.00)
GFR, Estimated: >60 mL/min (ref >60)
Glucose, Bld: 100 mg/dL — ABNORMAL HIGH (ref 70–99)
Potassium: 4.7 mmol/L (ref 3.5–5.1)
Sodium: 138 mmol/L (ref 135–145)

## 2023-01-25 LAB — GLUCOSE, CAPILLARY: Glucose-Capillary: 104 mg/dL — ABNORMAL HIGH (ref 70–99)

## 2023-01-25 MED ORDER — FLUDEOXYGLUCOSE F - 18 (FDG) INJECTION
9.0000 | Freq: Once | INTRAVENOUS | Status: AC | PRN
  Administered 2023-01-25: 7.25 via INTRAVENOUS

## 2023-01-25 NOTE — Research (Signed)
Z6109, ICE COMPRESS: RANDOMIZED TRIAL OF LIMB CRYOCOMPRESSION VERSUS CONTINUOUS COMPRESSION VERSUS LOW CYCLIC COMPRESSION FOR THE PREVENTION  OF TAXANE-INDUCED PERIPHERAL NEUROPATHY   Confirmed the following with patient today: - The patient has not previously received neurotoxic chemotherapy - The patient does not have pre-existing clinical peripheral neuropathy from any cause - The patient does not have a history of Raynaud's phenomenon, cold agglutinin disease, cryoglobulinemia, cryofibrinogenemia, post-traumatic cold dystrophy, or peripheral arterial ischemia.  - The patient does not have any open skin wounds or ulcers of the limbs   Patient states they are able to complete PROs in English.   Patient agreed to complete PROs at all scheduled assessments, and complete the baseline PRO questionnaires within 28 days prior to randomization.  She will come in tomorrow prior to chemo ed to complete consents, PROs, and neuro assessments.  Margret Chance Samier Jaco, RN, BSN, [facility] She  Her  Hers Clinical Research Nurse [facility] Direct Dial [phone]  Pager [phone] 01/25/2023 3:53 PM

## 2023-01-25 NOTE — Research (Signed)
SHORTER ANTHRACYCLINE-FREE CHEMO IMMUNOTHERAPY ADAPTED TO PATHOLOGICAL RESPONSE IN EARLY TRIPLE NEGATIVE BREAST CANCER (SCARLET), A RANDOMIZED PHASE III STUDY  Spoke with patient to let he know she is not a candidate for this study. She verbalized understanding.  Margret Chance Taesha Goodell, RN, BSN, [facility] She  Her  Hers Clinical Research Nurse [facility] Direct Dial [phone]  Pager [phone] 01/25/2023 3:52 PM

## 2023-01-25 NOTE — Progress Notes (Signed)
[facility] Spiritual Care Note  Due to unexpected schedule change, reached Valma by phone prior to her scan to offer support and encouragement. We plan to follow up in more detail as soon as feasible.   [address] Rush Barer, [place], [facility] Pager [phone] Voicemail [phone]

## 2023-01-26 ENCOUNTER — Inpatient Hospital Stay: Payer: BC Managed Care – PPO | Admitting: Pharmacist

## 2023-01-26 ENCOUNTER — Other Ambulatory Visit: Payer: Self-pay

## 2023-01-26 ENCOUNTER — Other Ambulatory Visit: Payer: Self-pay | Admitting: *Deleted

## 2023-01-26 ENCOUNTER — Inpatient Hospital Stay: Payer: BC Managed Care – PPO

## 2023-01-26 ENCOUNTER — Encounter: Payer: Self-pay | Admitting: *Deleted

## 2023-01-26 DIAGNOSIS — Z171 Estrogen receptor negative status [ER-]: Secondary | ICD-10-CM

## 2023-01-26 DIAGNOSIS — Z79899 Other long term (current) drug therapy: Secondary | ICD-10-CM | POA: Diagnosis not present

## 2023-01-26 DIAGNOSIS — C50311 Malignant neoplasm of lower-inner quadrant of right female breast: Secondary | ICD-10-CM | POA: Diagnosis not present

## 2023-01-26 DIAGNOSIS — Z90710 Acquired absence of both cervix and uterus: Secondary | ICD-10-CM | POA: Diagnosis not present

## 2023-01-26 DIAGNOSIS — Z5111 Encounter for antineoplastic chemotherapy: Secondary | ICD-10-CM | POA: Diagnosis not present

## 2023-01-26 DIAGNOSIS — Z9079 Acquired absence of other genital organ(s): Secondary | ICD-10-CM | POA: Diagnosis not present

## 2023-01-26 MED ORDER — AMOXICILLIN 500 MG PO TABS: 500.0000 mg | ORAL_TABLET | Freq: Two times a day (BID) | ORAL | 0 refills | Status: DC

## 2023-01-26 NOTE — Progress Notes (Signed)
Pt presented for chemo education today with complaint of back tooth discomfort/ pain.  Patient states she believes she may have cracked a tooth and is in the process of getting established with a dentist.  RN reviewed with MD and verbal orders received for pt to proceed with tx this week and start Amoxicillin 500 mg p.o BID x1 week while pt waits to be seen by DDS. Pt educated and verbalized understanding.

## 2023-01-26 NOTE — Research (Signed)
Trial Name:  [employer] 2021-05 - Specimen Collection Study to Evaluate Biomarkers in Subjects with Cancer   Patient Trini Soldo was identified by Dr [name] as a potential candidate for the above listed study.  This Clinical Research Nurse met with Afnan Cadiente, [id number] on 01/26/23 in a manner and location that ensures patient privacy to discuss participation in the above listed research study.  Patient is Unaccompanied.  Patient was previously provided with informed consent documents.  Patient has not yet read the informed consent documents and so documents were reviewed page by page today.  As outlined in the informed consent form, this Nurse and Efraim Kaufmann Altergott discussed the purpose of the research study, the investigational nature of the study, study procedures and requirements for study participation, potential risks and benefits of study participation, as well as alternatives to participation.  This study is not blinded or double-blinded. The patient understands participation is voluntary and they may withdraw from study participation at any time.  This study does not involve randomization.  This study does not involve an investigational drug or device. This study does not involve a placebo. Patient understands enrollment is pending full eligibility review.   Confidentiality and how the patient's information will be used as part of study participation were discussed.  Patient was informed there is reimbursement provided for their time and effort spent on trial participation.  The patient is encouraged to discuss research study participation with their insurance provider to determine what costs they may incur as part of study participation, including research related injury.    All questions were answered to patient's satisfaction.  The informed consent and embedded HIPAA was reviewed page by page.  The patient's mental and emotional status is appropriate to provide informed consent, and  the patient verbalizes an understanding of study participation.  Patient has agreed to participate in the above listed research study and has voluntarily signed the informed consent version 02 Nov 2021 and embedded HIPAA version 02 Nov 2021 on 01/26/23 at 1:01PM.  The patient was provided with a copy of the signed informed consent form with embedded HIPAA language for their reference.  No study specific procedures were obtained prior to the signing of the informed consent document.  Approximately 15 minutes were spent with the patient reviewing the informed consent documents.  Patient was not requested to complete a Release of Information form.  Medical History:  High Blood Pressure  No Coronary Artery Disease No Lupus    No Rheumatoid Arthritis  No Diabetes   No      Lynch Syndrome  No  Is the patient currently taking a magnesium supplement?   No  Does the patient have a personal history of cancer (greater than 5 years ago)?  No  Does the patient have a family history of cancer in 1st or 2nd degree relatives? No  Does the patient have history of alcohol consumption? Yes   If yes, current or former? former If former, year stopped? 2024 Number of years? 15 Drinks per week? 1  Does the patient have history of cigarette, cigar, pipe, or chewing tobacco use?  No   Gad Aymond G. Arhan Mcmanamon, RN, BSN, [facility] She  Her  Hers Clinical Research Nurse [facility] Direct Dial [phone]  Pager [phone] 01/26/2023 2:07 PM

## 2023-01-26 NOTE — Progress Notes (Signed)
Case: [id number] Date/Time: 01/27/23 0715   Procedure: INSERTION PORT-A-CATH   Anesthesia type: General   Pre-op diagnosis: RIGHT BREAST CANCER   [facility] ROOM 02 / [facility]   Surgeons: [name], MD       DISCUSSION: Suzanne Tucker is a 42 yo female who presents to PAT clinic for insertion of port-a-cath. She was recently diagnosed with Stage 3 breast cancer and will be undergoing chemo. Other pertinent PMH includes MVP and palpitations.   Patient has been followed by Cardiology for MVP and palpitations. Her most recent Echo on 10/22/20 showed normal EF with a normal looking mitral valve and trivial mitral regurgitation. She has worn a heart monitor patch for her palpitations and it showed PVCs but no dangerous arrhythmia.   Of note patient presented to urgent care on 4/20 for low grade fever (Tmax 100.2), fatigue, and diarrhea that had been going on for one week. No testing was done as the provider felt it was related to her cancer and supportive care recommended. Patient presented to PAT clinic 4/23 and was without symptoms. I called the patient and she confirmed her symptoms were resolved as of yesterday and she never had any URI symptoms or respiratory symptoms. 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   VS: BP 124/77 Comment: Left arm sitting  Pulse 80   Temp 37.3 C (Oral)   Resp 18   Ht  (1.549 m)   Wt 65.8 kg   SpO2 100%   BMI 27.40 kg/m   PROVIDERS: Georgina Quint, MD Cardiology: Dr. Varney Daily Oncology: Dr. Serena Croissant  LABS: Labs reviewed: Acceptable for surgery. (all labs ordered are listed, but only abnormal results are displayed)  Labs Reviewed  BASIC METABOLIC PANEL - Abnormal; Notable for the following components:      Result Value   Glucose, Bld 100 (*)    All other components within normal limits  CBC     IMAGES:  PET scan  01/25/23:   IMPRESSION: 1. Tracer avid lesion within the inferior medial right breast containing biopsy clip is identified compatible with primary breast carcinoma. 2. Extensive tracer avid right axillary and retropectoral lymph nodes compatible with metastatic adenopathy. 3. FDG avid right level 5A and 5B cervical lymph nodes compatible with metastatic adenopathy. 4. No signs of tracer avid distant metastatic disease. 5. Asymmetric increased uptake localizing to the left ovary is noted. This is a nonspecific finding and may be physiologic in a premenopausal female. If the patient is postmenopausal then further evaluation with pelvic sonogram is advised. 6. Gallstones.  Brain MRI w/wo contrast 01/22/23: .   IMPRESSION: No metastatic disease or acute intracranial abnormality. Stable and normal MRI appearance of the Brain  CXR 08/10/22:  IMPRESSION: No active cardiopulmonary disease.  EKG 11/15/22:  NSR Nonspecific ST-T wave abnormality Unchanged from prior  CV:  Telemetry 12/23/22:  Findings HR  avg 78  Min 50-Max 156  PVCs Rare, less than 1%  PACs Rare, less than 1%  SVT Nonsustained 1brief      Symptoms:             Chest pain>> sinus ( at a HR of 130)             flutters>> mostly with sinus, on one occasion the above [id number] of atrial tachycardia was detected, and one PVC was also detected  Conclusions: No serious arrhythmia Many symptoms were assoc with sinus rhtyhm, perhaps because the HR was on the faster side ( 90s); she did feel at least one of her PVCs and the short atrial run-- clearly she is very aware of her Heart  I tried to call her to see if she had any of her "aggressive" palpitaitons, left VM   Recommendations Nothing at this point   Echo 10/22/20:  IMPRESSIONS     1. Normal GLS -18.4. Left ventricular ejection fraction, by estimation,  is 60 to 65%. The left ventricle has normal function. The left ventricle  has no regional wall motion  abnormalities. Left ventricular diastolic  parameters were normal.   2. Right ventricular systolic function is normal. The right ventricular  size is normal.   3. The mitral valve is normal in structure. Trivial mitral valve  regurgitation. No evidence of mitral stenosis.   4. The aortic valve is normal in structure. Aortic valve regurgitation is  not visualized. No aortic stenosis is present.   5. The inferior vena cava is normal in size with greater than 50%  respiratory variability, suggesting right atrial pressure of 3 mmHg.        Past Medical History:  Diagnosis Date   Anginal pain 02/2019   related to MVP   Back pain    Breast cancer    Depression    Dysrhythmia    MVP   Mitral prolapse     Past Surgical History:  Procedure Laterality Date   BREAST SURGERY Right 2024   Breast biopsy x 2   CESAREAN SECTION  03/30/2008   ROBOTIC ASSISTED TOTAL HYSTERECTOMY Bilateral 03/15/2019   Procedure: XI ROBOTIC ASSISTED TOTAL HYSTERECTOMY with Bilateral Salpingectomy;  Surgeon: Silverio Lay, MD;  Location: [facility] [place];  Service: Gynecology;  Laterality: Bilateral;   TUBAL LIGATION  2017    MEDICATIONS:  acetaminophen (TYLENOL) 500 MG tablet   ALPRAZolam (XANAX) 0.25 MG tablet   calcium carbonate (TUMS - DOSED IN MG ELEMENTAL CALCIUM) 500 MG chewable tablet   dexamethasone (DECADRON) 4 MG tablet   fexofenadine (ALLEGRA) 180 MG tablet   fluticasone (FLONASE) 50 MCG/ACT nasal spray   lidocaine-prilocaine (EMLA) cream   ondansetron (ZOFRAN) 8 MG tablet   prochlorperazine (COMPAZINE) 10 MG tablet   No current facility-administered medications for this encounter.   Marcille Blanco [facility] Short Stay/Anesthesiology [facility] Phone [phone] 01/26/2023 8:59 AM

## 2023-01-26 NOTE — Progress Notes (Signed)
[facility]       Telephone: [phone]?Fax: [phone]   Oncology Clinical Pharmacist Practitioner Initial Assessment  Suzanne Tucker is a 42 y.o. female with a diagnosis of breast cancer. They were contacted today via in-person visit.  Indication/Regimen Pembrolizumab (Keytruda), paclitaxel (Taxol), carboplatin (Paraplatin ) followed by pembrolizumab [name], doxorubicin (Adriamycin), cyclophosphamide (Cytoxan) is being used appropriately for treatment of breast cancer by Dr. Serena Croissant.      Wt Readings from Last 1 Encounters:  01/25/23 145 lb (65.8 kg)    Estimated body surface area is 1.68 meters squared as calculated from the following:   Height as of 01/25/23:  (1.549 m).   Weight as of 01/25/23: 145 lb (65.8 kg).  The dosing regimen cycle is every 21 days x 4 cycles  Pembrolizumab (200 mg) on Day 1 Paclitaxel (80 mg/m2) on Days 1, 8, 15 Carboplatin (AUC 1.5) on Day 1, 8, 15  Followed by the below regimen cycle every 21 days x 4 cycles  Pembrolizumab (200 mg) Day 1 Doxorubicin (60 mg/m2) Day 1 Cyclophosphamide (600 mg/m2) Day 1 Pegfilgrastim (6 mg) on Day 3  It is planned to continue until treatment plan completion or unacceptable toxicity. The tentative start date is: 01/28/23  Dose Modifications Per Dr. [name], no dexamethasone prescription for pembrolizumab/paclitaxel/carboplatin portion of treatment plan. Filgrastim removed from days 16, 17, 18 for pembrolizumab/paclitaxel/carboplatin portion of treatment plan. Patient is aware.   Allergies Allergies  Allergen Reactions   Morphine Anaphylaxis and Shortness Of Breath   Tessalon [Benzonatate] Hives    Hives, itching, burning all over.    Hydrocodone Hives   Latex Rash   Antihistamines, Loratadine-Type Other (See Comments)    Patient reports numbness in tongue   Codeine Nausea And Vomiting   Tramadol Other (See Comments)    Tingling in the mouth with swelling     Vitals  = No vitals or labs were done today for this chemotherapy education visit   Contraindications Contraindications were reviewed? Yes Contraindications to therapy were identified? No   Safety Precautions The following safety precautions were reviewed:  Fever: reviewed the importance of having a thermometer and the Centers for Disease Control and Prevention (CDC) definition of fever which is 100.49F (38C) or higher. Patient should call 24/7 triage at [phone] if experiencing a fever or any other symptoms Decreased white blood cells (WBCs) and increased risk for infection Decreased platelet count and increased risk of bleeding Decreased hemoglobin, part of the red blood cells that carry iron and oxygen Nausea or vomiting Diarrhea or constipation Hair Loss Fatigue Changes in liver function Rash Peripheral Neuropathy Hypersensitivity reactions Pembrolizumab toxicities (skin, lung, liver, thyroid, kidneys, vision) Changes in color of urine Mouth sores MDS/AML Grapefruit products may interact with paclitaxel. Avoid use Handling body fluids and waste Intimacy, sexual activity, contraception, fertility  Medication Reconciliation Current Outpatient Medications  Medication Sig Dispense Refill   acetaminophen (TYLENOL) 500 MG tablet Take 1,000 mg by mouth in the morning and at bedtime. (Patient not taking: Reported on 01/26/2023)     calcium carbonate (TUMS - DOSED IN MG ELEMENTAL CALCIUM) 500 MG chewable tablet Chew 1,000 mg by mouth as needed for indigestion or heartburn. (Patient not taking: Reported on 01/26/2023)     dexamethasone (DECADRON) 4 MG tablet Take 1 tablet (4 mg total) by mouth daily. Take with Adriamycin 1 tablet day after chemo and 1 tablet 2 days after chemo with food (Patient not taking: Reported on 01/25/2023) 8 tablet  1   fexofenadine (ALLEGRA) 180 MG tablet Take 180 mg by mouth as needed for allergies or rhinitis. (Patient not taking: Reported on 01/26/2023)      fluticasone (FLONASE) 50 MCG/ACT nasal spray Place 2 sprays into both nostrils daily. (Patient not taking: Reported on 01/26/2023) 16 g 0   lidocaine-prilocaine (EMLA) cream Apply to affected area once (Patient not taking: Reported on 01/25/2023) 30 g 3   ondansetron (ZOFRAN) 8 MG tablet Take 1 tablet (8 mg total) by mouth every 8 (eight) hours as needed for nausea or vomiting. Start on the third day after chemotherapy. (Patient not taking: Reported on 01/25/2023) 30 tablet 1   prochlorperazine (COMPAZINE) 10 MG tablet Take 1 tablet (10 mg total) by mouth every 6 (six) hours as needed for nausea or vomiting. (Patient not taking: Reported on 01/25/2023) 30 tablet 1   No current facility-administered medications for this visit.   Medication reconciliation is based on the patient's most recent medication list in the electronic medical record (EMR) including herbal products and OTC medications.   The patient's medication list was reviewed today with the patient? Yes   Drug-drug interactions (DDIs) DDIs were evaluated? Yes Significant DDIs identified? No   Drug-Food Interactions Drug-food interactions were evaluated? Yes Drug-food interactions identified?  She will avoid grapefruit products  Follow-up Plan  Treatment start date: 01/28/23 Port placement date: 01/27/23 ECHO date: Dr. [name] will order closer to doxorubicin start date. Patient is aware We reviewed the prescriptions, premedications, and treatment regimen with the patient. Possible side effects of the treatment regimen were reviewed and management strategies were discussed.  Can use loperamide as needed for diarrhea, fexofenadine (tolerated by patient, reaction with loratadine) as needed for G-CSF bone pain, and Senna-S as needed for constipation.  Clinical pharmacy will assist Dr. Serena Croissant and [name] on an as needed basis going forward  [name] participated in the discussion, expressed understanding, and voiced  agreement with the above plan. All questions were answered to her satisfaction. The patient was advised to contact the clinic at [phone] with any questions or concerns prior to her return visit.   I spent 60 minutes assessing the patient.  Catia Todorov A. Odetta Pink, PharmD, BCOP, CPP  Anselm Lis, RPH-CPP, 01/26/2023 3:31 PM  **Disclaimer: This note was dictated with voice recognition software. Similar sounding words can inadvertently be transcribed and this note may contain transcription errors which may not have been corrected upon publication of note.**

## 2023-01-26 NOTE — Research (Signed)
Trial Name:  S2205, ICE COMPRESS: RANDOMIZED TRIAL OF LIMB CRYOCOMPRESSION VERSUS CONTINUOUS COMPRESSION VERSUS LOW CYCLIC COMPRESSION FOR THE PREVENTION  OF TAXANE-INDUCED PERIPHERAL NEUROPATHY  [name] was identified by Dr [name] as a potential candidate for the above listed study.  This Clinical Research Nurse met with Suzanne Tucker, [id number] on 01/26/23 in a manner and location that ensures patient privacy to discuss participation in the above listed research study.  Patient is Unaccompanied.  Patient was previously provided with informed consent documents.  Patient has not yet read the informed consent documents and so documents were reviewed page by page today.  As outlined in the informed consent form, this Nurse and Efraim Kaufmann Pawlicki discussed the purpose of the research study, the investigational nature of the study, study procedures and requirements for study participation, potential risks and benefits of study participation, as well as alternatives to participation.  This study is not blinded or double-blinded. The patient understands participation is voluntary and they may withdraw from study participation at any time.  Each study arm was reviewed, and randomization discussed.  Potential side effects were reviewed with patient as outlined in the consent form, and patient made aware there may be side effects not yet known. This study does not involve a placebo. Patient understands enrollment is pending full eligibility review.   Confidentiality and how the patient's information will be used as part of study participation were discussed.  Patient was informed there is not reimbursement provided for their time and effort spent on trial participation.  The patient is encouraged to discuss research study participation with their insurance provider to determine what costs they may incur as part of study participation, including research related injury.    All questions were  answered to patient's satisfaction.  The informed consent and separate HIPAA Authorization was reviewed page by page.  The patient's mental and emotional status is appropriate to provide informed consent, and the patient verbalizes an understanding of study participation.  Patient has agreed to participate in the above listed research study and has voluntarily signed the informed consent version 05/25/22 and separate HIPAA Authorization, version 12/17/21  on 01/26/23 at 1:01PM.  The patient was provided with a copy of the signed informed consent form and separate HIPAA Authorization for their reference.  No study specific procedures were obtained prior to the signing of the informed consent document.  Approximately 25 minutes were spent with the patient reviewing the informed consent documents.  Patient was not requested to complete a Release of Information form.  PROs and neuro assessments were completed after consent was signed. Plan for registration/randomization tomorrow and lab draw on Friday 01/28/23.  Margret Chance Emrick Hensch, RN, BSN, [facility]  Her  Hers Clinical Research Nurse [facility] Direct Dial [phone]  Pager [phone] 01/26/2023 2:10 PM

## 2023-01-26 NOTE — Anesthesia Preprocedure Evaluation (Addendum)
Anesthesia Evaluation  Patient identified by MRN, date of birth, ID band Patient awake    Reviewed: Allergy & Precautions, H&P , NPO status , Patient's Chart, lab work & pertinent test results  Airway Mallampati: II  TM Distance: >3 FB Neck ROM: Full    Dental no notable dental hx. (+) Chipped, Dental Advisory Given   Pulmonary neg pulmonary ROS   Pulmonary exam normal breath sounds clear to auscultation       Cardiovascular negative cardio ROS Normal cardiovascular exam Rhythm:Regular Rate:Normal     Neuro/Psych  Neuromuscular disease negative neurological ROS  negative psych ROS   GI/Hepatic negative GI ROS, Neg liver ROS,,,  Endo/Other  negative endocrine ROS    Renal/GU negative Renal ROS  negative genitourinary   Musculoskeletal negative musculoskeletal ROS (+)    Abdominal   Peds negative pediatric ROS (+)  Hematology negative hematology ROS (+) Lab Results      Component                Value               Date                      CREATININE               0.64                01/25/2023                BUN                      7                   01/25/2023                NA                       138                 01/25/2023                K                        4.7                 01/25/2023                CL                       105                 01/25/2023                CO2                      25                  01/25/2023              Anesthesia Other Findings All: See list  Reproductive/Obstetrics negative OB ROS                             Anesthesia Physical Anesthesia Plan  ASA: 2  Anesthesia Plan: General   Post-op Pain Management: Precedex and Tylenol PO (  pre-op)*   Induction: Intravenous  PONV Risk Score and Plan: 3 and Propofol infusion, TIVA, Ondansetron and Midazolam  Airway Management Planned: LMA  Additional Equipment: None  Intra-op Plan:    Post-operative Plan:   Informed Consent: I have reviewed the patients History and Physical, chart, labs and discussed the procedure including the risks, benefits and alternatives for the proposed anesthesia with the patient or authorized representative who has indicated his/her understanding and acceptance.     Dental advisory given  Plan Discussed with:   Anesthesia Plan Comments: (See PAT note from 4/23)        Anesthesia Quick Evaluation

## 2023-01-27 ENCOUNTER — Ambulatory Visit (HOSPITAL_COMMUNITY)
Admission: RE | Admit: 2023-01-27 | Discharge: 2023-01-27 | Disposition: A | Discharge status: Home / Self Care | Payer: BC Managed Care – PPO | Attending: General Surgery | Admitting: General Surgery

## 2023-01-27 ENCOUNTER — Ambulatory Visit (HOSPITAL_COMMUNITY): Payer: BC Managed Care – PPO

## 2023-01-27 ENCOUNTER — Encounter: Payer: Self-pay | Admitting: Emergency Medicine

## 2023-01-27 ENCOUNTER — Other Ambulatory Visit: Payer: Self-pay

## 2023-01-27 ENCOUNTER — Encounter: Payer: Self-pay | Admitting: *Deleted

## 2023-01-27 ENCOUNTER — Encounter (HOSPITAL_COMMUNITY): Payer: Self-pay | Admitting: General Surgery

## 2023-01-27 ENCOUNTER — Ambulatory Visit (HOSPITAL_COMMUNITY): Payer: BC Managed Care – PPO | Admitting: Anesthesiology

## 2023-01-27 ENCOUNTER — Encounter (HOSPITAL_COMMUNITY)
Admission: RE | Disposition: A | Discharge status: Home / Self Care | Payer: Self-pay | Source: Home / Self Care | Attending: General Surgery

## 2023-01-27 ENCOUNTER — Ambulatory Visit: Payer: BC Managed Care – PPO | Admitting: Hematology and Oncology

## 2023-01-27 ENCOUNTER — Ambulatory Visit (HOSPITAL_COMMUNITY): Payer: BC Managed Care – PPO | Admitting: Medical

## 2023-01-27 ENCOUNTER — Other Ambulatory Visit: Payer: BC Managed Care – PPO

## 2023-01-27 DIAGNOSIS — Z452 Encounter for adjustment and management of vascular access device: Secondary | ICD-10-CM | POA: Insufficient documentation

## 2023-01-27 DIAGNOSIS — Z171 Estrogen receptor negative status [ER-]: Secondary | ICD-10-CM

## 2023-01-27 DIAGNOSIS — C50311 Malignant neoplasm of lower-inner quadrant of right female breast: Secondary | ICD-10-CM | POA: Diagnosis not present

## 2023-01-27 DIAGNOSIS — I341 Nonrheumatic mitral (valve) prolapse: Secondary | ICD-10-CM | POA: Diagnosis not present

## 2023-01-27 DIAGNOSIS — C773 Secondary and unspecified malignant neoplasm of axilla and upper limb lymph nodes: Secondary | ICD-10-CM | POA: Insufficient documentation

## 2023-01-27 DIAGNOSIS — C50911 Malignant neoplasm of unspecified site of right female breast: Secondary | ICD-10-CM | POA: Diagnosis not present

## 2023-01-27 HISTORY — PX: PORTACATH PLACEMENT: SHX2246

## 2023-01-27 SURGERY — INSERTION, TUNNELED CENTRAL VENOUS DEVICE, WITH PORT
Anesthesia: General

## 2023-01-27 MED ORDER — PROPOFOL 10 MG/ML IV BOLUS
INTRAVENOUS | Status: DC | PRN
  Administered 2023-01-27: 150 mg via INTRAVENOUS
  Administered 2023-01-27: 30 mg via INTRAVENOUS

## 2023-01-27 MED ORDER — BUPIVACAINE HCL (PF) 0.25 % IJ SOLN
INTRAMUSCULAR | Status: DC | PRN
  Administered 2023-01-27: 7 mL

## 2023-01-27 MED ORDER — FENTANYL CITRATE (PF) 100 MCG/2ML IJ SOLN
INTRAMUSCULAR | Status: DC | PRN
  Administered 2023-01-27: 25 ug via INTRAVENOUS
  Administered 2023-01-27: 50 ug via INTRAVENOUS

## 2023-01-27 MED ORDER — OXYCODONE HCL 5 MG PO TABS: 2.5000 mg | ORAL_TABLET | Freq: Four times a day (QID) | ORAL | 0 refills | Status: DC | PRN

## 2023-01-27 MED ORDER — FENTANYL CITRATE (PF) 100 MCG/2ML IJ SOLN
INTRAMUSCULAR | Status: AC
  Filled 2023-01-27: qty 2

## 2023-01-27 MED ORDER — PROPOFOL 500 MG/50ML IV EMUL
INTRAVENOUS | Status: DC | PRN
  Administered 2023-01-27: 100 ug/kg/min via INTRAVENOUS

## 2023-01-27 MED ORDER — 0.9 % SODIUM CHLORIDE (POUR BTL) OPTIME
TOPICAL | Status: DC | PRN
  Administered 2023-01-27: 1000 mL

## 2023-01-27 MED ORDER — CHLORHEXIDINE GLUCONATE 0.12 % MT SOLN
15.0000 mL | Freq: Once | OROMUCOSAL | Status: AC
  Administered 2023-01-27: 15 mL via OROMUCOSAL

## 2023-01-27 MED ORDER — ONDANSETRON HCL 4 MG/2ML IJ SOLN
INTRAMUSCULAR | Status: AC
  Filled 2023-01-27: qty 2

## 2023-01-27 MED ORDER — LIDOCAINE HCL (PF) 1 % IJ SOLN
INTRAMUSCULAR | Status: AC
  Filled 2023-01-27: qty 30

## 2023-01-27 MED ORDER — LIDOCAINE HCL (PF) 2 % IJ SOLN
INTRAMUSCULAR | Status: AC
  Filled 2023-01-27: qty 5

## 2023-01-27 MED ORDER — CEFAZOLIN SODIUM-DEXTROSE 2-4 GM/100ML-% IV SOLN
2.0000 g | INTRAVENOUS | Status: AC
  Administered 2023-01-27: 2 g via INTRAVENOUS
  Filled 2023-01-27: qty 100

## 2023-01-27 MED ORDER — LIDOCAINE-EPINEPHRINE (PF) 1 %-1:200000 IJ SOLN
INTRAMUSCULAR | Status: AC
  Filled 2023-01-27: qty 30

## 2023-01-27 MED ORDER — CHLORHEXIDINE GLUCONATE CLOTH 2 % EX PADS: 6.0000 | MEDICATED_PAD | Freq: Once | CUTANEOUS | Status: DC

## 2023-01-27 MED ORDER — PROPOFOL 10 MG/ML IV BOLUS
INTRAVENOUS | Status: AC
  Filled 2023-01-27: qty 20

## 2023-01-27 MED ORDER — HEPARIN SOD (PORK) LOCK FLUSH 100 UNIT/ML IV SOLN
INTRAVENOUS | Status: AC
  Filled 2023-01-27: qty 5

## 2023-01-27 MED ORDER — HEPARIN SOD (PORK) LOCK FLUSH 100 UNIT/ML IV SOLN
INTRAVENOUS | Status: DC | PRN
  Administered 2023-01-27: 500 [IU]

## 2023-01-27 MED ORDER — HEPARIN 6000 UNIT IRRIGATION SOLUTION
Freq: Once | Status: AC
  Administered 2023-01-27: 1
  Filled 2023-01-27: qty 500

## 2023-01-27 MED ORDER — ACETAMINOPHEN 500 MG PO TABS
1000.0000 mg | ORAL_TABLET | ORAL | Status: AC
  Administered 2023-01-27: 1000 mg via ORAL
  Filled 2023-01-27: qty 2

## 2023-01-27 MED ORDER — ACETAMINOPHEN 10 MG/ML IV SOLN: 1000.0000 mg | Freq: Once | INTRAVENOUS | Status: DC | PRN

## 2023-01-27 MED ORDER — DEXAMETHASONE SODIUM PHOSPHATE 10 MG/ML IJ SOLN
INTRAMUSCULAR | Status: AC
  Filled 2023-01-27: qty 1

## 2023-01-27 MED ORDER — ONDANSETRON HCL 4 MG/2ML IJ SOLN: 4.0000 mg | Freq: Once | INTRAMUSCULAR | Status: DC | PRN

## 2023-01-27 MED ORDER — BUPIVACAINE HCL (PF) 0.25 % IJ SOLN
INTRAMUSCULAR | Status: AC
  Filled 2023-01-27: qty 30

## 2023-01-27 MED ORDER — LIDOCAINE HCL (CARDIAC) PF 100 MG/5ML IV SOSY
PREFILLED_SYRINGE | INTRAVENOUS | Status: DC | PRN
  Administered 2023-01-27: 100 mg via INTRAVENOUS

## 2023-01-27 MED ORDER — LACTATED RINGERS IV SOLN: INTRAVENOUS | Status: DC

## 2023-01-27 MED ORDER — ORAL CARE MOUTH RINSE: 15.0000 mL | Freq: Once | OROMUCOSAL | Status: AC

## 2023-01-27 MED ORDER — ONDANSETRON HCL 4 MG/2ML IJ SOLN
INTRAMUSCULAR | Status: DC | PRN
  Administered 2023-01-27: 4 mg via INTRAVENOUS

## 2023-01-27 MED ORDER — MIDAZOLAM HCL 2 MG/2ML IJ SOLN
INTRAMUSCULAR | Status: AC
  Filled 2023-01-27: qty 2

## 2023-01-27 MED ORDER — MIDAZOLAM HCL 5 MG/5ML IJ SOLN
INTRAMUSCULAR | Status: DC | PRN
  Administered 2023-01-27: 1 mg via INTRAVENOUS

## 2023-01-27 MED ORDER — ROCURONIUM BROMIDE 10 MG/ML (PF) SYRINGE
PREFILLED_SYRINGE | INTRAVENOUS | Status: AC
  Filled 2023-01-27: qty 10

## 2023-01-27 MED ORDER — FENTANYL CITRATE PF 50 MCG/ML IJ SOSY: 25.0000 ug | PREFILLED_SYRINGE | INTRAMUSCULAR | Status: DC | PRN

## 2023-01-27 MED ORDER — LIDOCAINE-EPINEPHRINE 1 %-1:100000 IJ SOLN
INTRAMUSCULAR | Status: DC | PRN
  Administered 2023-01-27: 7 mL

## 2023-01-27 MED FILL — Dexamethasone Sodium Phosphate Inj 100 MG/10ML: INTRAMUSCULAR | Qty: 1 | Status: AC

## 2023-01-27 SURGICAL SUPPLY — 42 items
ADH SKN CLS APL DERMABOND .7 (GAUZE/BANDAGES/DRESSINGS) ×1
APL PRP STRL LF DISP 70% ISPRP (MISCELLANEOUS) ×1
BAG COUNTER SPONGE SURGICOUNT (BAG) IMPLANT
BAG DECANTER FOR FLEXI CONT (MISCELLANEOUS) ×1 IMPLANT
BAG SPNG CNTER NS LX DISP (BAG)
BIOPATCH WHT 1IN DISK W/4.0 H (GAUZE/BANDAGES/DRESSINGS) IMPLANT
BLADE HEX COATED 2.75 (ELECTRODE) ×1 IMPLANT
BLADE SURG 15 STRL LF DISP TIS (BLADE) ×1 IMPLANT
BLADE SURG 15 STRL SS (BLADE) ×1
BLADE SURG SZ11 CARB STEEL (BLADE) ×1 IMPLANT
CHLORAPREP W/TINT 26 (MISCELLANEOUS) ×1 IMPLANT
COVER SURGICAL LIGHT HANDLE (MISCELLANEOUS) ×1 IMPLANT
DERMABOND ADVANCED .7 DNX12 (GAUZE/BANDAGES/DRESSINGS) ×1 IMPLANT
DRAPE C-ARM 42X120 X-RAY (DRAPES) ×1 IMPLANT
DRAPE LAPAROTOMY TRNSV 102X78 (DRAPES) ×1 IMPLANT
DRAPE UTILITY XL STRL (DRAPES) ×1 IMPLANT
DRSG TEGADERM 4X4.75 (GAUZE/BANDAGES/DRESSINGS) IMPLANT
DRSG TEGADERM 6X8 (GAUZE/BANDAGES/DRESSINGS) IMPLANT
ELECT REM PT RETURN 15FT ADLT (MISCELLANEOUS) ×1 IMPLANT
GAUZE 4X4 16PLY ~~LOC~~+RFID DBL (SPONGE) ×1 IMPLANT
GAUZE SPONGE 4X4 12PLY STRL (GAUZE/BANDAGES/DRESSINGS) IMPLANT
GLOVE BIO SURGEON STRL SZ 6 (GLOVE) ×1 IMPLANT
GLOVE INDICATOR 6.5 STRL GRN (GLOVE) ×1 IMPLANT
GOWN STRL REUS W/ TWL XL LVL3 (GOWN DISPOSABLE) ×1 IMPLANT
GOWN STRL REUS W/TWL XL LVL3 (GOWN DISPOSABLE) ×1
IV CONNECTOR ONE LINK NDLESS (IV SETS) IMPLANT
KIT BASIN OR (CUSTOM PROCEDURE TRAY) ×1 IMPLANT
KIT PORT POWER 8FR ISP CVUE (Port) IMPLANT
KIT TURNOVER KIT A (KITS) IMPLANT
NDL HYPO 22X1.5 SAFETY MO (MISCELLANEOUS) ×1 IMPLANT
NEEDLE HYPO 22X1.5 SAFETY MO (MISCELLANEOUS) ×1 IMPLANT
PACK BASIC VI WITH GOWN DISP (CUSTOM PROCEDURE TRAY) ×1 IMPLANT
PENCIL SMOKE EVACUATOR (MISCELLANEOUS) IMPLANT
SPIKE FLUID TRANSFER (MISCELLANEOUS) ×1 IMPLANT
SUT MNCRL AB 4-0 PS2 18 (SUTURE) ×1 IMPLANT
SUT PROLENE 2 0 SH DA (SUTURE) ×2 IMPLANT
SUT VIC AB 3-0 SH 27 (SUTURE) ×1
SUT VIC AB 3-0 SH 27X BRD (SUTURE) ×1 IMPLANT
SYR 10ML LL (SYRINGE) ×1 IMPLANT
SYR CONTROL 10ML LL (SYRINGE) ×1 IMPLANT
TOWEL OR 17X26 10 PK STRL BLUE (TOWEL DISPOSABLE) ×1 IMPLANT
TOWEL OR NON WOVEN STRL DISP B (DISPOSABLE) ×1 IMPLANT

## 2023-01-27 NOTE — Anesthesia Procedure Notes (Signed)
Procedure Name: LMA Insertion Date/Time: 01/27/2023 7:53 AM  Performed by: Garth Bigness, CRNAPre-anesthesia Checklist: Patient identified, Emergency Drugs available, Suction available and Patient being monitored Patient Re-evaluated:Patient Re-evaluated prior to induction Oxygen Delivery Method: Circle system utilized Preoxygenation: Pre-oxygenation with 100% oxygen Induction Type: IV induction Ventilation: Mask ventilation without difficulty LMA: LMA inserted LMA Size: 4.0 Tube type: Oral Number of attempts: 1 Placement Confirmation: positive ETCO2 and breath sounds checked- equal and bilateral Tube secured with: Tape Dental Injury: Teeth and Oropharynx as per pre-operative assessment

## 2023-01-27 NOTE — Transfer of Care (Signed)
Immediate Anesthesia Transfer of Care Note  Patient: Suzanne Tucker  Procedure(s) Performed: INSERTION PORT-A-CATH  Patient Location: PACU  Anesthesia Type:General  Level of Consciousness: awake, alert , oriented, and patient cooperative  Airway & Oxygen Therapy: Patient Spontanous Breathing and Patient connected to face mask oxygen  Post-op Assessment: Report given to RN and Post -op Vital signs reviewed and stable  Post vital signs: Reviewed and stable  Last Vitals:  Vitals Value Taken Time  BP 107/69 01/27/23 0846  Temp    Pulse 85 01/27/23 0847  Resp 10 01/27/23 0847  SpO2 100 % 01/27/23 0847  Vitals shown include unvalidated device data.  Last Pain:  Vitals:   01/27/23 0641  TempSrc:   PainSc: 0-No pain         Complications: No notable events documented.

## 2023-01-27 NOTE — Anesthesia Postprocedure Evaluation (Signed)
Anesthesia Post Note  Patient: Suzanne Tucker  Procedure(s) Performed: INSERTION PORT-A-CATH     Patient location during evaluation: PACU Anesthesia Type: General Level of consciousness: awake and alert Pain management: pain level controlled Vital Signs Assessment: post-procedure vital signs reviewed and stable Respiratory status: spontaneous breathing, nonlabored ventilation, respiratory function stable and patient connected to nasal cannula oxygen Cardiovascular status: blood pressure returned to baseline and stable Postop Assessment: no apparent nausea or vomiting Anesthetic complications: no  No notable events documented.  Last Vitals:  Vitals:   01/27/23 0930 01/27/23 0945  BP: 115/68 113/73  Pulse: 79 66  Resp: 16   Temp:  36.7 C  SpO2: 100% 100%    Last Pain:  Vitals:   01/27/23 0846  TempSrc:   PainSc: 0-No pain                 Trevor Iha

## 2023-01-27 NOTE — H&P (Signed)
REFERRING PHYSICIAN: Juanetta Gosling  PROVIDER: Matthias Hughs, MD  Care Team: Patient Care Team: Edwina Barth, MD as PCP - General (Internal Medicine) Matthias Hughs, MD as Consulting Provider (Surgical Oncology) Buckner Malta, MD (Radiation Oncology) Sabas Sous, MD (Hematology and Oncology)  MRN: [id number] DOB: 03-Mar-1981 DATE OF ENCOUNTER: 01/19/2023  Subjective  Chief Complaint: Breast Cancer   History of Present Illness: Suzanne Tucker is a 42 y.o. female who is seen today as an office consultation at the request of Dr. [name] for evaluation of Breast Cancer .  Pt presents with a new breast cancer dx right side 01/2023. She felt a mass in her axilla. She went to her doctor who also thought there was an additional mass near the areola. Diagnostic imaging was performed. This showed a 1.1 cm mass at 4 o'clock with a 6 mm satellite nodule. There were also > 4 abnormal nodes seen in the axilla. The dominant breast mass and one of the nodes were biopsied. 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  Pt was set up for MR today and had that as well. There was some additional non mass enhancement adjacent to the tumor and the two tumors in the breast did appear contiguous.  Pt has no family history of cancer. 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 She has a 34 yo daughter in the 9th grade.  Family cancer history: none Menarche - age 30 Menopause 2020 Parity G1P1  Diagnostic mammogram:01/07/23  Dx breast u/s [facility] 01/07/2023  Pathology core needle biopsy: 01/10/2023 1. Breast, right, needle core biopsy, 4 o'clock 3 cmfn INVASIVE POORLY DIFFERENTIATED DUCTAL ADENOCARCINOMA, GRADE 3 (3+3+3) TUBULE FORMATION: SCORE 3 NUCLEAR PLEOMORPHISM: SCORE 3 MITOTIC COUNT: SCORE 3 TOTAL SCORE: 9 OVERALL GRADE GRADE 3 (9/9) ANGIOLYMPHATIC INVASION NOT IDENTIFIED NEGATIVE FOR MICROCALCIFICATIONS TUMOR MEASURES 3.9 MM IN  GREATEST LINEAR EXTENT 2. Lymph node, needle/core biopsy, right axillary adenopathy POORLY DIFFERENTIATED DUCTAL ADENOCARCINOMA, GRADE 3 (3+3+3) NEGATIVE FOR LYMPHOID STROMA/NODAL TISSUE  Receptors: The tumor cells are NEGATIVE for Her2 (1+). Estrogen Receptor: 0%, NEGATIVE Progesterone Receptor: 0%, NEGATIVE Proliferation Marker Ki67: 95%  MR breast 01/19/23  IMPRESSION: 1. Biopsy-proven malignancy in the lower inner right breast measuring 2.2 cm. 2. Additional linear non mass enhancement extending posterior to the dominant mass measuring 2.1 cm. 3. Bulky right axillary lymphadenopathy involving axillary levels I, II, and III. 4. No MRI evidence of malignancy in the left breast.  RECOMMENDATION: If breast conservation is desired recommend MRI guided core needle biopsy of the 2.1 cm non mass enhancement in the right breast.  BI-RADS CATEGORY 4: Suspicious.    Review of Systems: A complete review of systems was obtained from the patient. I have reviewed this information and discussed as appropriate with the patient. See HPI as well for other ROS. ROS- insomnia, weight loss, fatigue, nausea  Medical History: Past Medical History: Diagnosis Date History of cancer  Patient Active Problem List Diagnosis Malignant neoplasm of lower-inner quadrant of right breast of female, estrogen receptor negative (CMS/HHS-HCC) Breast cancer metastasized to axillary lymph node, right (CMS/HHS-HCC) Carpal tunnel syndrome on right Dizzy spells Chronic pansinusitis MVP (mitral valve prolapse) Palpitations Axillary mass, right Weight loss  Past Surgical History: Procedure Laterality Date .hysterectomy Unknown date .tubal ligation Unknown date CESAREAN SECTION Unknown date   Allergies Allergen Reactions Benzonatate Hives Hives, itching, burning all over. Morphine Anaphylaxis Latex Rash Codeine Nausea And Vomiting  Current Outpatient Medications on File Prior to  Visit Medication Sig Dispense Refill fluticasone propionate (FLONASE) 50 mcg/actuation nasal spray Place 2 sprays into both nostrils once daily sodium chloride 1,000 mg soluble tablet Take 1 g by mouth 3 (three) times daily  No current facility-administered medications on file prior to visit.  Family History Problem Relation Age of Onset Stroke Mother   Social History  Tobacco Use Smoking Status Never Smokeless Tobacco Never   Social History  Socioeconomic History Marital status: Single Tobacco Use Smoking status: Never Smokeless tobacco: Never Vaping Use Vaping status: Never Used Substance and Sexual Activity Alcohol use: Never Drug use: Never  Social Determinants of Health  Financial Resource Strain: Low Risk (12/27/2022) Received from [facility] Overall Financial Resource Strain (CARDIA) Difficulty of Paying Living Expenses: Not very hard Food Insecurity: No Food Insecurity (12/27/2022) Received from [facility] Hunger Vital Sign Worried About Running Out of Food in the Last Year: Never true Ran Out of Food in the Last Year: Never true Transportation Needs: No Transportation Needs (12/27/2022) Received from [facility] - Transportation Lack of Transportation (Medical): No Lack of Transportation (Non-Medical): No Physical Activity: Insufficiently Active (12/27/2022) Received from [facility] Exercise Vital Sign Days of Exercise per Week: 4 days Minutes of Exercise per Session: 20 min Stress: No Stress Concern Present (12/27/2022) Received from [facility] - Occupational Stress Questionnaire Feeling of Stress : Only a little Social Connections: Moderately Isolated (12/27/2022) Received from [facility] Social Connection and Isolation Panel [NHANES] Frequency of Communication with Friends and Family: More than three times a week Frequency of Social Gatherings with Friends and Family: Once a week Attends  Religious Services: Never Database administrator or Organizations: No Marital Status: Married  Objective:  Vitals: 01/19/23 2220 BP: 134/75 Pulse: 106 Resp: 18 Temp: 36.5 C (97.7 F) Weight: 67.6 kg (149 lb) Height: 154.9 cm ( )  Body mass index is 28.15 kg/m.  Gen: No acute distress. Well nourished and well groomed. Neurological: Alert and oriented to person, place, and time. Coordination normal. Head: Normocephalic and atraumatic. Eyes: Conjunctivae are normal. Pupils are equal, round, and reactive to light. No scleral icterus. Neck: Normal range of motion. Neck supple. No tracheal deviation or thyromegaly present. Cardiovascular: Normal rate, regular rhythm, normal heart sounds and intact distal pulses. Exam reveals no gallop and no friction rub. No murmur heard. Breast: right breast with large relatively fixed axillary mass visible and palpable. . No palpable breast mass. + supraclavicular vs low cervical node on right. Left breast benign. No nipple retraction vs nipple discharge. No skin dimpling on breast. Respiratory: Effort normal. No respiratory distress. No chest wall tenderness. Breath sounds normal. No wheezes, rales or rhonchi. GI: Soft. Bowel sounds are normal. The abdomen is soft and nontender. There is no rebound and no guarding. Musculoskeletal: Normal range of motion. Extremities are nontender. Lymphadenopathy: No cervical, preauricular, postauricular or axillary adenopathy is present Skin: Skin is warm and dry. No rash noted. No diaphoresis. No erythema. No pallor. No clubbing, cyanosis, or edema. Psychiatric: Normal mood and affect. Behavior is normal. Judgment and thought content normal.  Labs 01/19/2023 CBC normal, CMET with K 3.3  Assessment and Plan:  ICD-10-CM 1. Malignant neoplasm of lower-inner quadrant of right breast of female, estrogen receptor negative (CMS/HHS-HCC) C50.311 Z17.1  2. Breast cancer metastasized to axillary lymph node, right  (CMS/HHS-HCC) C50.911 C77.3   Pt has a new diagnosis of cT1cN2M? right breast cancer. Given the triple negative nature and positive nodes, we recommend  neoadjuvant chemotherapy. In general, this type of cancer is typically quite responsive to chemo. Staging studies are ordered with PET and MR brain.  Genetic testing is also recommended due to age.  If patient has stage IV disease, will likely not proceed to definitive surgery. However, if she has stage 3 disease, will plan right MRM and left prophylactic mastectomy. The patient does not desire reconstruction.  I discussed port placement with the patient. I reviewed risks of bleeding, infection, pneumothorax, malfunction, malposition, possible need for additional surgeries, and more. Port model and anatomic diagrams were shown to the patient.  Chemo would be followed by whatever surgery we elect. She will need XRT even if mastectomy elected due to nodes. No antihormonal tx will be given as this is triple negative.  Pt was seen and discussed in multidisciplinary fashion.  No follow-ups on file.  Maudry Diego, MD FACS Surgical Oncology, General Surgery, Trauma and Critical Care [facility]

## 2023-01-27 NOTE — Interval H&P Note (Signed)
History and Physical Interval Note:  01/27/2023 7:38 AM  Suzanne Tucker  has presented today for surgery, with the diagnosis of RIGHT BREAST CANCER.  The various methods of treatment have been discussed with the patient and family. After consideration of risks, benefits and other options for treatment, the patient has consented to  Procedure(s): INSERTION PORT-A-CATH (N/A) as a surgical intervention.  The patient's history has been reviewed, patient examined, no change in status, stable for surgery.  I have reviewed the patient's chart and labs.  Questions were answered to the patient's satisfaction.     [name]

## 2023-01-27 NOTE — Research (Signed)
U0454, ICE COMPRESS: RANDOMIZED TRIAL OF LIMB CRYOCOMPRESSION VERSUS CONTINUOUS COMPRESSION VERSUS LOW CYCLIC COMPRESSION FOR THE PREVENTION  OF TAXANE-INDUCED PERIPHERAL NEUROPATHY  SECOND ELIGIBILITY CHECK  This Nurse has reviewed this patient's inclusion and exclusion criteria as a second review and confirms Samanda Skalicky is eligible for study participation.  Patient may continue with enrollment.  Lurena Joiner 'Warden [employer]ers' Dairl Ponder, RN, BSN, [facility] Clinical Research Nurse I 01/27/23 11:02 AM

## 2023-01-27 NOTE — Discharge Instructions (Signed)
[facility] Office Phone Number [phone]   POST OP INSTRUCTIONS  Always review your discharge instruction sheet given to you by the facility where your surgery was performed.  IF YOU HAVE DISABILITY OR FAMILY LEAVE FORMS, YOU MUST BRING THEM TO THE OFFICE FOR PROCESSING.  DO NOT GIVE THEM TO YOUR DOCTOR.  Take 2 tylenol (acetominophen) three times a day for 3 days.  If you still have pain, add ibuprofen with food in between if able to take this (if you have kidney issues or stomach issues, do not take ibuprofen).  If both of those are not enough, add the narcotic pain pill.  If you find you are needing a lot of this overnight after surgery, call the next morning for a refill.   Take your usually prescribed medications unless otherwise directed If you need a refill on your pain medication, please contact your pharmacy.  They will contact our office to request authorization.  Prescriptions will not be filled after 5pm or on week-ends. You should eat very light the first 24 hours after surgery, such as soup, crackers, pudding, etc.  Resume your normal diet the day after surgery It is common to experience some constipation if taking pain medication after surgery.  Increasing fluid intake and taking a stool softener will usually help or prevent this problem from occurring.  A mild laxative (Milk of Magnesia or Miralax) should be taken according to package directions if there are no bowel movements after 48 hours. You may shower in 48 hours.  The surgical glue will flake off in 2-3 weeks.   ACTIVITIES:  No strenuous activity or heavy lifting for 1 week.   You may drive when you no longer are taking prescription pain medication, you can comfortably wear a seatbelt, and you can safely maneuver your car and apply brakes. RETURN TO WORK:  __________to be determined._______________ You should see your doctor in the office for a follow-up appointment approximately three-four weeks after  your surgery.    WHEN TO CALL YOUR DOCTOR: Fever over 101.0 Nausea and/or vomiting. Extreme swelling or bruising. Continued bleeding from incision. Increased pain, redness, or drainage from the incision.  The clinic staff is available to answer your questions during regular business hours.  Please don't hesitate to call and ask to speak to one of the nurses for clinical concerns.  If you have a medical emergency, go to the nearest emergency room or call 911.  A surgeon from [facility] is always on call at the hospital.  For further questions, please visit [email or web address]   

## 2023-01-27 NOTE — Research (Signed)
Z6109, ICE COMPRESS: RANDOMIZED TRIAL OF LIMB CRYOCOMPRESSION VERSUS CONTINUOUS COMPRESSION VERSUS LOW CYCLIC COMPRESSION FOR THE PREVENTION  OF TAXANE-INDUCED PERIPHERAL NEUROPATHY    This Nurse has reviewed this patient's inclusion and exclusion criteria and confirmed Suzanne Tucker is eligible for study participation.  Patient will continue with enrollment.  Eligibility confirmed by treating investigator, who also agrees that patient should proceed with enrollment.  Confirmed no lesions or wounds. Team eligibility review done. Will proceed with registration.  [name], RN, BSN, [facility]  [facility] Clinical Research Nurse [facility] Direct Dial [phone]  Pager [phone] 01/27/2023 10:56 AM

## 2023-01-27 NOTE — Op Note (Signed)
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      SURGEON:  [name], MD      ANESTHESIA:  General   FINDINGS:  Good venous return, easy flush, and tip of the catheter and   SVC 20 cm.      SPECIMEN:  None.      ESTIMATED BLOOD LOSS:  Minimal.      COMPLICATIONS:  None known.      PROCEDURE:  Pt was identified in the holding area and taken to   the operating room, where patient was placed supine on the operating room   table.  General anesthesia was induced.  Patient's arms were tucked and the upper   chest and neck were prepped and draped in sterile fashion.  Time-out was   performed according to the surgical safety check list.  When all was   correct, we continued.   Local anesthetic was administered over this   area at the angle of the clavicle.  The vein was accessed with 1 pass(es) of the needle. There was good venous return and the wire passed easily with no ectopy.   Fluoroscopy was used to confirm that the wire was in the vena cava.      The patient was placed back level and the area for the pocket was anethetized   with local anesthetic.  A 3-cm transverse incision was made with a #15   blade.  Cautery was used to divide the subcutaneous tissues down to the   pectoralis muscle.  An Army-Navy retractor was used to elevate the skin   while a pocket was created on top of the pectoralis fascia.  The port   was placed into the pocket to confirm that it was of adequate size.  The   catheter was preattached to the port.  The port was then secured to the   pectoralis fascia with four 2-0 Prolene sutures.  These were clamped and   not tied down yet.    The catheter was tunneled through to the wire exit   site.  The catheter was placed along the wire to determine what length  it should be to be in the SVC.  The catheter was cut at 20 cm.  The tunneler sheath and dilator were passed over the wire and the dilator and wire were removed.  The catheter was advanced through the tunneler sheath and the tunneler sheath was pulled away.  Care was taken to keep the catheter in the tunneler sheath as this occurred. This was advanced and the tunneler sheath was removed.  There was good venous   return and easy flush of the catheter.  The Prolene sutures were tied   down to the pectoral fascia.  The skin was reapproximated using 3-0   Vicryl interrupted deep dermal sutures.    Fluoroscopy was used to re-confirm good position of the catheter.  The skin   was then closed using 4-0 Monocryl in a subcuticular fashion.  The port was flushed with concentrated heparin flush as well.  The wounds were then cleaned, dried, and dressed with Dermabond.    The port was left accessed with biopatch and tegaderm.    The patient was awakened from anesthesia and taken to the PACU in stable condition.  Needle, sponge, and  instrument counts were correct.               [name], MD

## 2023-01-28 ENCOUNTER — Inpatient Hospital Stay (HOSPITAL_BASED_OUTPATIENT_CLINIC_OR_DEPARTMENT_OTHER): Payer: BC Managed Care – PPO | Admitting: Adult Health

## 2023-01-28 ENCOUNTER — Inpatient Hospital Stay: Payer: BC Managed Care – PPO

## 2023-01-28 ENCOUNTER — Encounter: Payer: Self-pay | Admitting: Interventional Cardiology

## 2023-01-28 ENCOUNTER — Encounter: Payer: Self-pay | Admitting: Adult Health

## 2023-01-28 VITALS — BP 125/75 | HR 82 | Temp 98.9°F | Resp 18

## 2023-01-28 DIAGNOSIS — Z171 Estrogen receptor negative status [ER-]: Secondary | ICD-10-CM

## 2023-01-28 DIAGNOSIS — Z79899 Other long term (current) drug therapy: Secondary | ICD-10-CM | POA: Diagnosis not present

## 2023-01-28 DIAGNOSIS — Z9079 Acquired absence of other genital organ(s): Secondary | ICD-10-CM | POA: Diagnosis not present

## 2023-01-28 DIAGNOSIS — Z90710 Acquired absence of both cervix and uterus: Secondary | ICD-10-CM | POA: Diagnosis not present

## 2023-01-28 DIAGNOSIS — C50311 Malignant neoplasm of lower-inner quadrant of right female breast: Secondary | ICD-10-CM | POA: Diagnosis not present

## 2023-01-28 DIAGNOSIS — Z95828 Presence of other vascular implants and grafts: Secondary | ICD-10-CM | POA: Insufficient documentation

## 2023-01-28 DIAGNOSIS — Z5111 Encounter for antineoplastic chemotherapy: Secondary | ICD-10-CM | POA: Diagnosis not present

## 2023-01-28 LAB — CBC WITH DIFFERENTIAL (CANCER CENTER ONLY)
Abs Immature Granulocytes: 0.02 10*3/uL (ref 0.00–0.07)
Basophils Absolute: 0 10*3/uL (ref 0.0–0.1)
Basophils Relative: 0 %
Eosinophils Absolute: 0 10*3/uL (ref 0.0–0.5)
Eosinophils Relative: 0 %
HCT: 35.6 % — ABNORMAL LOW (ref 36.0–46.0)
Hemoglobin: 11.9 g/dL — ABNORMAL LOW (ref 12.0–15.0)
Immature Granulocytes: 0 %
Lymphocytes Relative: 21 %
Lymphs Abs: 1.7 10*3/uL (ref 0.7–4.0)
MCH: 29.6 pg (ref 26.0–34.0)
MCHC: 33.4 g/dL (ref 30.0–36.0)
MCV: 88.6 fL (ref 80.0–100.0)
Monocytes Absolute: 0.6 10*3/uL (ref 0.1–1.0)
Monocytes Relative: 8 %
Neutro Abs: 5.6 10*3/uL (ref 1.7–7.7)
Neutrophils Relative %: 71 %
Platelet Count: 248 10*3/uL (ref 150–400)
RBC: 4.02 MIL/uL (ref 3.87–5.11)
RDW: 12.3 % (ref 11.5–15.5)
WBC Count: 8 10*3/uL (ref 4.0–10.5)
nRBC: 0 % (ref 0.0–0.2)

## 2023-01-28 LAB — CMP (CANCER CENTER ONLY)
ALT: 7 U/L (ref 0–44)
AST: 15 U/L (ref 15–41)
Albumin: 4.3 g/dL (ref 3.5–5.0)
Alkaline Phosphatase: 45 U/L (ref 38–126)
Anion gap: 6 (ref 5–15)
BUN: 6 mg/dL (ref 6–20)
CO2: 27 mmol/L (ref 22–32)
Calcium: 9.3 mg/dL (ref 8.9–10.3)
Chloride: 104 mmol/L (ref 98–111)
Creatinine: 0.63 mg/dL (ref 0.44–1.00)
GFR, Estimated: >60 mL/min (ref >60)
Glucose, Bld: 87 mg/dL (ref 70–99)
Potassium: 3.6 mmol/L (ref 3.5–5.1)
Sodium: 137 mmol/L (ref 135–145)
Total Bilirubin: 0.6 mg/dL (ref 0.3–1.2)
Total Protein: 6.9 g/dL (ref 6.5–8.1)

## 2023-01-28 LAB — TSH: TSH: 0.652 u[IU]/mL (ref 0.350–4.500)

## 2023-01-28 LAB — RESEARCH LABS

## 2023-01-28 MED ORDER — SODIUM CHLORIDE 0.9% FLUSH
10.0000 mL | Freq: Once | INTRAVENOUS | Status: AC
  Administered 2023-01-28: 10 mL

## 2023-01-28 MED ORDER — SODIUM CHLORIDE 0.9 % IV SOLN
10.0000 mg | Freq: Once | INTRAVENOUS | Status: AC
  Administered 2023-01-28: 10 mg via INTRAVENOUS
  Filled 2023-01-28: qty 10

## 2023-01-28 MED ORDER — FAMOTIDINE IN NACL 20-0.9 MG/50ML-% IV SOLN
20.0000 mg | Freq: Once | INTRAVENOUS | Status: AC
  Administered 2023-01-28: 20 mg via INTRAVENOUS
  Filled 2023-01-28: qty 50

## 2023-01-28 MED ORDER — PALONOSETRON HCL INJECTION 0.25 MG/5ML
0.2500 mg | Freq: Once | INTRAVENOUS | Status: AC
  Administered 2023-01-28: 0.25 mg via INTRAVENOUS
  Filled 2023-01-28: qty 5

## 2023-01-28 MED ORDER — HEPARIN SOD (PORK) LOCK FLUSH 100 UNIT/ML IV SOLN
500.0000 [IU] | Freq: Once | INTRAVENOUS | Status: AC | PRN
  Administered 2023-01-28: 500 [IU]

## 2023-01-28 MED ORDER — SODIUM CHLORIDE 0.9 % IV SOLN
80.0000 mg/m2 | Freq: Once | INTRAVENOUS | Status: AC
  Administered 2023-01-28: 138 mg via INTRAVENOUS
  Filled 2023-01-28: qty 23

## 2023-01-28 MED ORDER — SODIUM CHLORIDE 0.9% FLUSH
10.0000 mL | INTRAVENOUS | Status: DC | PRN
  Administered 2023-01-28: 10 mL

## 2023-01-28 MED ORDER — DIPHENHYDRAMINE HCL 50 MG/ML IJ SOLN
50.0000 mg | Freq: Once | INTRAMUSCULAR | Status: AC
  Administered 2023-01-28: 50 mg via INTRAVENOUS
  Filled 2023-01-28: qty 1

## 2023-01-28 MED ORDER — SODIUM CHLORIDE 0.9 % IV SOLN
200.0000 mg | Freq: Once | INTRAVENOUS | Status: AC
  Administered 2023-01-28: 200 mg via INTRAVENOUS
  Filled 2023-01-28: qty 8

## 2023-01-28 MED ORDER — SODIUM CHLORIDE 0.9 % IV SOLN
188.7000 mg | Freq: Once | INTRAVENOUS | Status: AC
  Administered 2023-01-28: 190 mg via INTRAVENOUS
  Filled 2023-01-28: qty 19

## 2023-01-28 MED ORDER — SODIUM CHLORIDE 0.9 % IV SOLN: Freq: Once | INTRAVENOUS | Status: AC

## 2023-01-28 NOTE — Progress Notes (Signed)
[facility] Cancer Follow up:    Suzanne Quint, MD [address] [place] [address]   DIAGNOSIS:  Cancer Staging  Malignant neoplasm of lower-inner quadrant of right breast of female, estrogen receptor negative (HCC) Staging form: Breast, AJCC 8th Edition - Clinical: Stage IIIC (cT2, cN3c, cM0, G3, ER-, PR-, HER2-) - Unsigned Histologic grading system: 3 grade system   SUMMARY OF ONCOLOGIC HISTORY: Oncology History  Malignant neoplasm of lower-inner quadrant of right breast of female, estrogen receptor negative (HCC)  01/10/2023 Initial Diagnosis   Palpable lump right axilla x 2 weeks bulky axillary lymphadenopathy ultrasound breast: 2 masses at 4 o'clock position 1.1 cm and 0.6 cm multiple axillary lymph nodes largest 4.5 cm also level 2 and 3 (subpectoral lymph node as well): Biopsy: Grade 3 IDC ER 0%, PR 0%, Ki-67 95%, HER2 1+   01/26/2023 -  Chemotherapy   Patient is on Treatment Plan : BREAST Pembrolizumab (200) D1 + Carboplatin (1.5) D1,8,15 + Paclitaxel (80) D1,8,15 q21d X 4 cycles / Pembrolizumab (200) D1 + AC D1 q21d x 4 cycles       CURRENT THERAPY: Neoadjuvant chemotherapy  INTERVAL HISTORY: Suzanne Tucker 42 y.o. female returns for f/u prior to receiving her first cycle of Taxol/Carbo/Keytruda.  She is doing well today.  She has some occasional discomfort since undergoing port placement yesterday but otherwise she is feeling well and has her antinausea medicines and knows how to take them.   Patient Active Problem List   Diagnosis Date Noted   Port-A-Cath in place 01/28/2023   Malignant neoplasm of lower-inner quadrant of right breast of female, estrogen receptor negative (HCC) 01/17/2023   Axillary mass, right 12/28/2022   Palpitations 11/23/2022   Dizzy spells 03/04/2022   Carpal tunnel syndrome on right 11/02/2017   MVP (mitral valve prolapse) 10/28/2017    is allergic to morphine; tessalon [benzonatate]; hydrocodone; latex;  antihistamines, loratadine-type; codeine; and tramadol.  MEDICAL HISTORY: Past Medical History:  Diagnosis Date   Anginal pain (HCC) 02/2019   related to MVP   Back pain    Breast cancer (HCC)    Depression    Dysrhythmia    MVP   Mitral prolapse     SURGICAL HISTORY: Past Surgical History:  Procedure Laterality Date   BREAST SURGERY Right 2024   Breast biopsy x 2   CESAREAN SECTION  03/30/2008   PORTACATH PLACEMENT N/A 01/27/2023   Procedure: INSERTION PORT-A-CATH;  Surgeon: [name], MD;  Location: [facility];  Service: General;  Laterality: N/A;  leaving accessed   ROBOTIC ASSISTED TOTAL HYSTERECTOMY Bilateral 03/15/2019   Procedure: XI ROBOTIC ASSISTED TOTAL HYSTERECTOMY with Bilateral Salpingectomy;  Surgeon: Silverio Lay, MD;  Location: [facility];  Service: Gynecology;  Laterality: Bilateral;   TUBAL LIGATION  2017    SOCIAL HISTORY: Social History   Socioeconomic History   Marital status: Single    Spouse name: Not on file   Number of children: Not on file   Years of education: Not on file   Highest education level: Master's degree (e.g., MA, MS, MEng, MEd, MSW, MBA)  Occupational History   Not on file  Tobacco Use   Smoking status: Never   Smokeless tobacco: Never  Vaping Use   Vaping Use: Never used  Substance and Sexual Activity   Alcohol use: Not Currently    Comment:     Drug use: No   Sexual activity: Yes    Birth control/protection: Surgical  Other Topics  Concern   Not on file  Social History Narrative   Not on file   Social Determinants of Health   Financial Resource Strain: Low Risk  (12/27/2022)   Overall Financial Resource Strain (CARDIA)    Difficulty of Paying Living Expenses: Not very hard  Food Insecurity: No Food Insecurity (12/27/2022)   Hunger Vital Sign    Worried About Running Out of Food in the Last Year: Never true    Ran Out of Food in the Last Year: Never true  Transportation Needs: No Transportation Needs  (12/27/2022)   PRAPARE - Administrator, Civil Service (Medical): No    Lack of Transportation (Non-Medical): No  Physical Activity: Insufficiently Active (12/27/2022)   Exercise Vital Sign    Days of Exercise per Week: 4 days    Minutes of Exercise per Session: 20 min  Stress: No Stress Concern Present (12/27/2022)   [employer] of Occupational Health - Occupational Stress Questionnaire    Feeling of Stress : Only a little  Social Connections: Moderately Isolated (12/27/2022)   Social Connection and Isolation Panel [NHANES]    Frequency of Communication with Friends and Family: More than three times a week    Frequency of Social Gatherings with Friends and Family: Once a week    Attends Religious Services: Never    Database administrator or Organizations: No    Attends Engineer, structural: Not on file    Marital Status: Married  Catering manager Violence: Not on file    FAMILY HISTORY: Family History  Problem Relation Age of Onset   Stroke Mother    Alzheimer's disease Father    Diabetes Maternal Grandmother    Heart disease Maternal Grandmother    Heart attack Maternal Grandmother    Diabetes Maternal Grandfather    Heart disease Maternal Grandfather    Alzheimer's disease Paternal Grandmother    Dementia Paternal Grandmother    Alzheimer's disease Paternal Grandfather    Dementia Paternal Grandfather    Alcohol abuse Paternal Grandfather     Review of Systems  Constitutional:  Negative for appetite change, chills, fatigue, fever and unexpected weight change.  HENT:   Negative for hearing loss, lump/mass and trouble swallowing.   Eyes:  Negative for eye problems and icterus.  Respiratory:  Negative for chest tightness, cough and shortness of breath.   Cardiovascular:  Negative for chest pain, leg swelling and palpitations.  Gastrointestinal:  Negative for abdominal distention, abdominal pain, constipation, diarrhea, nausea and vomiting.   Endocrine: Negative for hot flashes.  Genitourinary:  Negative for difficulty urinating.   Musculoskeletal:  Negative for arthralgias.  Skin:  Negative for itching and rash.  Neurological:  Negative for dizziness, extremity weakness, headaches and numbness.  Hematological:  Negative for adenopathy. Does not bruise/bleed easily.  Psychiatric/Behavioral:  Negative for depression. The patient is not nervous/anxious.       PHYSICAL EXAMINATION   Onc Performance Status - 01/28/23 1115       ECOG Perf Status   ECOG Perf Status Fully active, able to carry on all pre-disease performance without restriction      KPS SCALE   KPS % SCORE Normal, no compliants, no evidence of disease             Vitals:   01/28/23 1102  BP: 116/75  Pulse: 73  Resp: 18  Temp: (!) 97.4 F (36.3 C)  SpO2: 100%    Physical Exam Constitutional:      General:  She is not in acute distress.    Appearance: Normal appearance. She is not toxic-appearing.  HENT:     Head: Normocephalic and atraumatic.  Eyes:     General: No scleral icterus. Cardiovascular:     Rate and Rhythm: Normal rate and regular rhythm.     Pulses: Normal pulses.     Heart sounds: Normal heart sounds.  Pulmonary:     Effort: Pulmonary effort is normal.     Breath sounds: Normal breath sounds.  Abdominal:     General: Abdomen is flat. Bowel sounds are normal. There is no distension.     Palpations: Abdomen is soft.     Tenderness: There is no abdominal tenderness.  Musculoskeletal:        General: No swelling.     Cervical back: Neck supple.  Lymphadenopathy:     Cervical: No cervical adenopathy.  Skin:    General: Skin is warm and dry.     Findings: No rash.  Neurological:     General: No focal deficit present.     Mental Status: She is alert.  Psychiatric:        Mood and Affect: Mood normal.        Behavior: Behavior normal.     LABORATORY DATA:  CBC    Component Value Date/Time   WBC 8.0 01/28/2023 1008    WBC 6.9 01/25/2023 1039   RBC 4.02 01/28/2023 1008   HGB 11.9 (L) 01/28/2023 1008   HGB 11.6 04/08/2022 1037   HCT 35.6 (L) 01/28/2023 1008   HCT 35.8 04/08/2022 1037   PLT 248 01/28/2023 1008   PLT 244 04/08/2022 1037   MCV 88.6 01/28/2023 1008   MCV 89 04/08/2022 1037   MCH 29.6 01/28/2023 1008   MCHC 33.4 01/28/2023 1008   RDW 12.3 01/28/2023 1008   RDW 12.1 04/08/2022 1037   LYMPHSABS 1.7 01/28/2023 1008   LYMPHSABS 1.3 04/08/2022 1037   MONOABS 0.6 01/28/2023 1008   EOSABS 0.0 01/28/2023 1008   EOSABS 0.1 04/08/2022 1037   BASOSABS 0.0 01/28/2023 1008   BASOSABS 0.0 04/08/2022 1037    CMP     Component Value Date/Time   NA 137 01/28/2023 1008   NA 140 04/08/2022 1037   K 3.6 01/28/2023 1008   CL 104 01/28/2023 1008   CO2 27 01/28/2023 1008   GLUCOSE 87 01/28/2023 1008   BUN 6 01/28/2023 1008   BUN 9 04/08/2022 1037   CREATININE 0.63 01/28/2023 1008   CALCIUM 9.3 01/28/2023 1008   PROT 6.9 01/28/2023 1008   PROT 6.5 04/08/2022 1037   ALBUMIN 4.3 01/28/2023 1008   ALBUMIN 4.3 04/08/2022 1037   AST 15 01/28/2023 1008   ALT 7 01/28/2023 1008   ALKPHOS 45 01/28/2023 1008   BILITOT 0.6 01/28/2023 1008   GFRNONAA >60 01/28/2023 1008   GFRAA 132 11/04/2020 1208    ASSESSMENT and THERAPY PLAN:   Malignant neoplasm of lower-inner quadrant of right breast of female, estrogen receptor negative (HCC) Suzanne Tucker is a 42 year old woman with stage IIIc triple negative breast cancer here today for follow-up and evaluation prior to beginning neoadjuvant chemotherapy.    Treatment plan: Neoadjuvant chemotherapy with Taxol Harrell Gave weekly x 12 followed by Adriamycin Cytoxan Keytruda followed by Rande Lawman maintenance Breast conserving surgery with ALND Adjuvant radiation therapy  PET scan and MRI brain negative for metastases  We reviewed her chemotherapy in detail today.    Her tooth abscess is improved on the amoxicillin and she  will continue this.  She will  return next week for labs, f/u, and her next treatment.     All questions were answered. The patient knows to call the clinic with any problems, questions or concerns. We can certainly see the patient much sooner if necessary.  Total encounter time:20 minutes*in face-to-face visit time, chart review, lab review, care coordination, order entry, and documentation of the encounter time.  Lillard Anes, NP 01/28/23 11:46 AM Medical Oncology and Hematology [facility] [address] [place], [place] [address] Tel. [phone]    Fax. [phone]  *Total Encounter Time as defined by the Centers for Medicare and Medicaid Services includes, in addition to the face-to-face time of a patient visit (documented in the note above) non-face-to-face time: obtaining and reviewing outside history, ordering and reviewing medications, tests or procedures, care coordination (communications with other health care professionals or caregivers) and documentation in the medical record.

## 2023-01-28 NOTE — Research (Signed)
[employer] 2021-05 - Specimen Collection Study to Evaluate Biomarkers in Subjects with Cancer   This Nurse has reviewed this patient's inclusion and exclusion criteria and confirmed Suzanne Tucker is eligible for study participation.  Patient will continue with enrollment.  Eligibility confirmed by treating investigator, who also agrees that patient should proceed with enrollment.  Suzanne Chance Aprile Dickenson, RN, BSN, [facility] She  Her  Hers Clinical Research Nurse [facility] Direct Dial [phone]  Pager [phone] 01/28/2023 8:52 AM

## 2023-01-28 NOTE — Research (Signed)
[employer] 2021-05 - Specimen Collection Study to Evaluate Biomarkers in Subjects with Cancer     This Nurse has reviewed this patient's inclusion and exclusion criteria as a second review and confirms Suzanne Tucker is eligible for study participation.  Patient may continue with enrollment.  Chriss Driver, RN 01/28/23 9:23 AM

## 2023-01-28 NOTE — Patient Instructions (Signed)
[facility]  Discharge Instructions: Thank you for choosing [facility] to provide your oncology and hematology care.   If you have a lab appointment with the [facility], please go directly to the [facility] and check in at the registration area.   Wear comfortable clothing and clothing appropriate for easy access to any Portacath or PICC line.   We strive to give you quality time with your provider. You may need to reschedule your appointment if you arrive late (15 or more minutes).  Arriving late affects you and other patients whose appointments are after yours.  Also, if you miss three or more appointments without notifying the office, you may be dismissed from the clinic at the provider's discretion.      For prescription refill requests, have your pharmacy contact our office and allow 72 hours for refills to be completed.    Today you received the following chemotherapy and/or immunotherapy agents Keytruda, Taxol & Carboplatin      To help prevent nausea and vomiting after your treatment, we encourage you to take your nausea medication as directed.  BELOW ARE SYMPTOMS THAT SHOULD BE REPORTED IMMEDIATELY: *FEVER GREATER THAN 100.4 F (38 C) OR HIGHER *CHILLS OR SWEATING *NAUSEA AND VOMITING THAT IS NOT CONTROLLED WITH YOUR NAUSEA MEDICATION *UNUSUAL SHORTNESS OF BREATH *UNUSUAL BRUISING OR BLEEDING *URINARY PROBLEMS (pain or burning when urinating, or frequent urination) *BOWEL PROBLEMS (unusual diarrhea, constipation, pain near the anus) TENDERNESS IN MOUTH AND THROAT WITH OR WITHOUT PRESENCE OF ULCERS (sore throat, sores in mouth, or a toothache) UNUSUAL RASH, SWELLING OR PAIN  UNUSUAL VAGINAL DISCHARGE OR ITCHING   Items with * indicate a potential emergency and should be followed up as soon as possible or go to the Emergency Department if any problems should occur.  Please show the CHEMOTHERAPY ALERT CARD or IMMUNOTHERAPY  ALERT CARD at check-in to the Emergency Department and triage nurse.  Should you have questions after your visit or need to cancel or reschedule your appointment, please contact [facility]  Dept: [phone]  and follow the prompts.  Office hours are 8:00 a.m. to 4:30 p.m. Monday - Friday. Please note that voicemails left after 4:00 p.m. may not be returned until the following business day.  We are closed weekends and major holidays. You have access to a nurse at all times for urgent questions. Please call the main number to the clinic Dept: [phone] and follow the prompts.   For any non-urgent questions, you may also contact your provider using MyChart. We now offer e-Visits for anyone 65 and older to request care online for non-urgent symptoms. For details visit [email or web address].   Also download the MyChart app! Go to the app store, search "MyChart", open the app, select [facility], and log in with your MyChart username and password.  Pembrolizumab Injection What is this medication? PEMBROLIZUMAB (PEM broe LIZ ue mab) treats some types of cancer. It works by helping your immune system slow or stop the spread of cancer cells. It is a monoclonal antibody. This medicine may be used for other purposes; ask your health care provider or pharmacist if you have questions. COMMON BRAND NAME(S): Keytruda What should I tell my care team before I take this medication? They need to know if you have any of these conditions: Allogeneic stem cell transplant (uses someone else's stem cells) Autoimmune diseases, such as Crohn disease, ulcerative colitis, lupus History of  chest radiation Nervous system problems, such as Guillain-Barre syndrome, myasthenia gravis Organ transplant An unusual or allergic reaction to pembrolizumab, other medications, foods, dyes, or preservatives Pregnant or trying to get pregnant Breast-feeding How should I use this  medication? This medication is injected into a vein. It is given by your care team in a hospital or clinic setting. A special MedGuide will be given to you before each treatment. Be sure to read this information carefully each time. Talk to your care team about the use of this medication in children. While it may be prescribed for children as young as 6 months for selected conditions, precautions do apply. Overdosage: If you think you have taken too much of this medicine contact a poison control [facility] or emergency room at once. NOTE: This medicine is only for you. Do not share this medicine with others. What if I miss a dose? Keep appointments for follow-up doses. It is important not to miss your dose. Call your care team if you are unable to keep an appointment. What may interact with this medication? Interactions have not been studied. This list may not describe all possible interactions. Give your health care provider a list of all the medicines, herbs, non-prescription drugs, or dietary supplements you use. Also tell them if you smoke, drink alcohol, or use illegal drugs. Some items may interact with your medicine. What should I watch for while using this medication? Your condition will be monitored carefully while you are receiving this medication. You may need blood work while taking this medication. This medication may cause serious skin reactions. They can happen weeks to months after starting the medication. Contact your care team right away if you notice fevers or flu-like symptoms with a rash. The rash may be red or purple and then turn into blisters or peeling of the skin. You may also notice a red rash with swelling of the face, lips, or lymph nodes in your neck or under your arms. Tell your care team right away if you have any change in your eyesight. Talk to your care team if you may be pregnant. Serious birth defects can occur if you take this medication during pregnancy and for 4  months after the last dose. You will need a negative pregnancy test before starting this medication. Contraception is recommended while taking this medication and for 4 months after the last dose. Your care team can help you find the option that works for you. Do not breastfeed while taking this medication and for 4 months after the last dose. What side effects may I notice from receiving this medication? Side effects that you should report to your care team as soon as possible: Allergic reactions--skin rash, itching, hives, swelling of the face, lips, tongue, or throat Dry cough, shortness of breath or trouble breathing Eye pain, redness, irritation, or discharge with blurry or decreased vision Heart muscle inflammation--unusual weakness or fatigue, shortness of breath, chest pain, fast or irregular heartbeat, dizziness, swelling of the ankles, feet, or hands Hormone gland problems--headache, sensitivity to light, unusual weakness or fatigue, dizziness, fast or irregular heartbeat, increased sensitivity to cold or heat, excessive sweating, constipation, hair loss, increased thirst or amount of urine, tremors or shaking, irritability Infusion reactions--chest pain, shortness of breath or trouble breathing, feeling faint or lightheaded Kidney injury (glomerulonephritis)--decrease in the amount of urine, red or dark brown urine, foamy or bubbly urine, swelling of the ankles, hands, or feet Liver injury--right upper belly pain, loss of appetite, nausea, light-colored stool,  dark yellow or brown urine, yellowing skin or eyes, unusual weakness or fatigue Pain, tingling, or numbness in the hands or feet, muscle weakness, change in vision, confusion or trouble speaking, loss of balance or coordination, trouble walking, seizures Rash, fever, and swollen lymph nodes Redness, blistering, peeling, or loosening of the skin, including inside the mouth Sudden or severe stomach pain, bloody diarrhea, fever, nausea,  vomiting Side effects that usually do not require medical attention (report to your care team if they continue or are bothersome): Bone, joint, or muscle pain Diarrhea Fatigue Loss of appetite Nausea Skin rash This list may not describe all possible side effects. Call your doctor for medical advice about side effects. You may report side effects to FDA at 1-800-FDA-1088. Where should I keep my medication? This medication is given in a hospital or clinic. It will not be stored at home. NOTE: This sheet is a summary. It may not cover all possible information. If you have questions about this medicine, talk to your doctor, pharmacist, or health care provider.  2023 Elsevier/Gold Standard (2022-02-02 00:00:00)  Paclitaxel Injection What is this medication? PACLITAXEL (PAK li TAX el) treats some types of cancer. It works by slowing down the growth of cancer cells. This medicine may be used for other purposes; ask your health care provider or pharmacist if you have questions. COMMON BRAND NAME(S): Onxol, Taxol What should I tell my care team before I take this medication? They need to know if you have any of these conditions: Heart disease Liver disease Low white blood cell levels An unusual or allergic reaction to paclitaxel, other medications, foods, dyes, or preservatives If you or your partner are pregnant or trying to get pregnant Breast-feeding How should I use this medication? This medication is injected into a vein. It is given by your care team in a hospital or clinic setting. Talk to your care team about the use of this medication in children. While it may be given to children for selected conditions, precautions do apply. Overdosage: If you think you have taken too much of this medicine contact a poison control [facility] or emergency room at once. NOTE: This medicine is only for you. Do not share this medicine with others. What if I miss a dose? Keep appointments for follow-up  doses. It is important not to miss your dose. Call your care team if you are unable to keep an appointment. What may interact with this medication? Do not take this medication with any of the following: Live virus vaccines Other medications may affect the way this medication works. Talk with your care team about all of the medications you take. They may suggest changes to your treatment plan to lower the risk of side effects and to make sure your medications work as intended. This list may not describe all possible interactions. Give your health care provider a list of all the medicines, herbs, non-prescription drugs, or dietary supplements you use. Also tell them if you smoke, drink alcohol, or use illegal drugs. Some items may interact with your medicine. What should I watch for while using this medication? Your condition will be monitored carefully while you are receiving this medication. You may need blood work while taking this medication. This medication may make you feel generally unwell. This is not uncommon as chemotherapy can affect healthy cells as well as cancer cells. Report any side effects. Continue your course of treatment even though you feel ill unless your care team tells you to stop. This  medication can cause serious allergic reactions. To reduce the risk, your care team may give you other medications to take before receiving this one. Be sure to follow the directions from your care team. This medication may increase your risk of getting an infection. Call your care team for advice if you get a fever, chills, sore throat, or other symptoms of a cold or flu. Do not treat yourself. Try to avoid being around people who are sick. This medication may increase your risk to bruise or bleed. Call your care team if you notice any unusual bleeding. Be careful brushing or flossing your teeth or using a toothpick because you may get an infection or bleed more easily. If you have any dental work  done, tell your dentist you are receiving this medication. Talk to your care team if you may be pregnant. Serious birth defects can occur if you take this medication during pregnancy. Talk to your care team before breastfeeding. Changes to your treatment plan may be needed. What side effects may I notice from receiving this medication? Side effects that you should report to your care team as soon as possible: Allergic reactions--skin rash, itching, hives, swelling of the face, lips, tongue, or throat Heart rhythm changes--fast or irregular heartbeat, dizziness, feeling faint or lightheaded, chest pain, trouble breathing Increase in blood pressure Infection--fever, chills, cough, sore throat, wounds that don't heal, pain or trouble when passing urine, general feeling of discomfort or being unwell Low blood pressure--dizziness, feeling faint or lightheaded, blurry vision Low red blood cell level--unusual weakness or fatigue, dizziness, headache, trouble breathing Painful swelling, warmth, or redness of the skin, blisters or sores at the infusion site Pain, tingling, or numbness in the hands or feet Slow heartbeat--dizziness, feeling faint or lightheaded, confusion, trouble breathing, unusual weakness or fatigue Unusual bruising or bleeding Side effects that usually do not require medical attention (report to your care team if they continue or are bothersome): Diarrhea Hair loss Joint pain Loss of appetite Muscle pain Nausea Vomiting This list may not describe all possible side effects. Call your doctor for medical advice about side effects. You may report side effects to FDA at 1-800-FDA-1088. Where should I keep my medication? This medication is given in a hospital or clinic. It will not be stored at home. NOTE: This sheet is a summary. It may not cover all possible information. If you have questions about this medicine, talk to your doctor, pharmacist, or health care provider.  2023  Elsevier/Gold Standard (2022-02-04 00:00:00)  Carboplatin Injection What is this medication? CARBOPLATIN (KAR boe pla tin) treats some types of cancer. It works by slowing down the growth of cancer cells. This medicine may be used for other purposes; ask your health care provider or pharmacist if you have questions. COMMON BRAND NAME(S): Paraplatin What should I tell my care team before I take this medication? They need to know if you have any of these conditions: Blood disorders Hearing problems Kidney disease Recent or ongoing radiation therapy An unusual or allergic reaction to carboplatin, cisplatin, other medications, foods, dyes, or preservatives Pregnant or trying to get pregnant Breast-feeding How should I use this medication? This medication is injected into a vein. It is given by your care team in a hospital or clinic setting. Talk to your care team about the use of this medication in children. Special care may be needed. Overdosage: If you think you have taken too much of this medicine contact a poison control [facility] or emergency room at  once. NOTE: This medicine is only for you. Do not share this medicine with others. What if I miss a dose? Keep appointments for follow-up doses. It is important not to miss your dose. Call your care team if you are unable to keep an appointment. What may interact with this medication? Medications for seizures Some antibiotics, such as amikacin, gentamicin, neomycin, streptomycin, tobramycin Vaccines This list may not describe all possible interactions. Give your health care provider a list of all the medicines, herbs, non-prescription drugs, or dietary supplements you use. Also tell them if you smoke, drink alcohol, or use illegal drugs. Some items may interact with your medicine. What should I watch for while using this medication? Your condition will be monitored carefully while you are receiving this medication. You may need blood work  while taking this medication. This medication may make you feel generally unwell. This is not uncommon, as chemotherapy can affect healthy cells as well as cancer cells. Report any side effects. Continue your course of treatment even though you feel ill unless your care team tells you to stop. In some cases, you may be given additional medications to help with side effects. Follow all directions for their use. This medication may increase your risk of getting an infection. Call your care team for advice if you get a fever, chills, sore throat, or other symptoms of a cold or flu. Do not treat yourself. Try to avoid being around people who are sick. Avoid taking medications that contain aspirin, acetaminophen, ibuprofen, naproxen, or ketoprofen unless instructed by your care team. These medications may hide a fever. Be careful brushing or flossing your teeth or using a toothpick because you may get an infection or bleed more easily. If you have any dental work done, tell your dentist you are receiving this medication. Talk to your care team if you wish to become pregnant or think you might be pregnant. This medication can cause serious birth defects. Talk to your care team about effective forms of contraception. Do not breast-feed while taking this medication. What side effects may I notice from receiving this medication? Side effects that you should report to your care team as soon as possible: Allergic reactions--skin rash, itching, hives, swelling of the face, lips, tongue, or throat Infection--fever, chills, cough, sore throat, wounds that don't heal, pain or trouble when passing urine, general feeling of discomfort or being unwell Low red blood cell level--unusual weakness or fatigue, dizziness, headache, trouble breathing Pain, tingling, or numbness in the hands or feet, muscle weakness, change in vision, confusion or trouble speaking, loss of balance or coordination, trouble walking,  seizures Unusual bruising or bleeding Side effects that usually do not require medical attention (report to your care team if they continue or are bothersome): Hair loss Nausea Unusual weakness or fatigue Vomiting This list may not describe all possible side effects. Call your doctor for medical advice about side effects. You may report side effects to FDA at 1-800-FDA-1088. Where should I keep my medication? This medication is given in a hospital or clinic. It will not be stored at home. NOTE: This sheet is a summary. It may not cover all possible information. If you have questions about this medicine, talk to your doctor, pharmacist, or health care provider.  2023 Elsevier/Gold Standard (2007-11-11 00:00:00)

## 2023-01-28 NOTE — Research (Signed)
TRIAL S2205, ICE COMPRESS: RANDOMIZED TRIAL OF LIMB CRYOCOMPRESSION VERSUS CONTINUOUS COMPRESSION VERSUS LOW CYCLIC COMPRESSION FOR THE PREVENTION  OF TAXANE-INDUCED PERIPHERAL NEUROPATHY   Patient arrives today Accompanied by her friend [name]  for the Week 1 visit. Confirmed no lesions, sores, wounds to extremities beyond knees & elbows. Port was placed yesterday on right side.   1311 Wraps applies; pre-cooling initiated. 1320 Tolerable; no complaints. 1325 Machine malfunction; replaced with machine that was pre-cooled in research office. 1330 Tolerable; no complaints. 1340 Tolerable; no complaints. 1354 Taxane started; moved to active cooling. 1400 Tolerable; no complaints. 1410 Tolerable; no complaints. 1420 Tolerable; no complaints. 1430 Tolerable; no complaints. 1521 Bathroom break; taxane complete. 1528 Restarted after bathroom break; restarted in post-cooling. Unable to restart in active phase, as Paxman machine has timed out. 1558 Thirty minute post-cooling done; will remain on for 7 minutes to make up for bathroom break. 1605 Treatment complete; no sores or wounds. Some blanchable redness to left forearm. No discomfort.   PROs: Per study protocol, all PROs required for this visit were completed prior to other study activities on 01/26/23 and completeness has been verified.     LABS: Optional labs are collected per consent and study protocol: [name] tolerated well without complaint.   MD/PROVIDER VISIT: Patient sees Lillard Anes, NP for today's visit.   ADVERSE EVENTS: [name] reports no AE, as treatment has not started yet..  EKG ASSESSMENT: EKG assessment is not required for this visit.  NEURO ASSESSMENT: The neuro assessment was completed by this nurse on 01/26/23. [name] tolerated all testing without complaint.   GIFT CARD: This study does not provide visit compensation.   DISPOSITION: Upon completion off all study  requirements, patient remained in infusion to complete flush. Patient has no complaints or questions at this time.   The patient was thanked for their time and continued voluntary participation in this study. [name] has been provided direct contact information and is encouraged to contact this Nurse for any needs or questions.  Margret Chance Shimeka Bacot, RN, BSN, [facility]  Her  [facility] Clinical Research Nurse [facility] Direct Dial [phone]  Pager [phone] 01/28/2023 4:29 PM

## 2023-01-28 NOTE — Assessment & Plan Note (Signed)
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    Treatment plan: Neoadjuvant chemotherapy with Taxol Harrell Gave weekly x 12 followed by Adriamycin Cytoxan Keytruda followed by Rande Lawman maintenance Breast conserving surgery with ALND Adjuvant radiation therapy  PET scan and MRI brain negative for metastases  We reviewed her chemotherapy in detail today.    Her tooth abscess is improved on the amoxicillin and she will continue this.  She will return next week for labs, f/u, and her next treatment.

## 2023-01-28 NOTE — Research (Signed)
[employer] 2021-05 - Specimen Collection Study to Evaluate Biomarkers in Subjects with Cancer   Blood drawn today in port flush. Gift card provided and signed for by patient. Patient remained in infusion for first chemo today.  Margret Chance Endia Moncur, RN, BSN, [facility]  Her  [facility] Clinical Research Nurse [facility] Direct Dial [phone]  Pager [phone] 01/28/2023 11:08 AM

## 2023-01-30 ENCOUNTER — Emergency Department (HOSPITAL_BASED_OUTPATIENT_CLINIC_OR_DEPARTMENT_OTHER): Payer: BC Managed Care – PPO

## 2023-01-30 ENCOUNTER — Encounter (HOSPITAL_BASED_OUTPATIENT_CLINIC_OR_DEPARTMENT_OTHER): Payer: Self-pay | Admitting: Emergency Medicine

## 2023-01-30 ENCOUNTER — Other Ambulatory Visit: Payer: Self-pay

## 2023-01-30 ENCOUNTER — Emergency Department (HOSPITAL_BASED_OUTPATIENT_CLINIC_OR_DEPARTMENT_OTHER)
Admission: EM | Admit: 2023-01-30 | Discharge: 2023-01-30 | Disposition: A | Discharge status: Home / Self Care | Payer: BC Managed Care – PPO | Attending: Emergency Medicine | Admitting: Emergency Medicine

## 2023-01-30 DIAGNOSIS — Z853 Personal history of malignant neoplasm of breast: Secondary | ICD-10-CM | POA: Insufficient documentation

## 2023-01-30 DIAGNOSIS — R590 Localized enlarged lymph nodes: Secondary | ICD-10-CM | POA: Diagnosis not present

## 2023-01-30 DIAGNOSIS — R0789 Other chest pain: Secondary | ICD-10-CM | POA: Insufficient documentation

## 2023-01-30 DIAGNOSIS — F419 Anxiety disorder, unspecified: Secondary | ICD-10-CM | POA: Insufficient documentation

## 2023-01-30 DIAGNOSIS — R0781 Pleurodynia: Secondary | ICD-10-CM

## 2023-01-30 DIAGNOSIS — Z91040 Latex allergy status: Secondary | ICD-10-CM | POA: Diagnosis not present

## 2023-01-30 DIAGNOSIS — R079 Chest pain, unspecified: Secondary | ICD-10-CM | POA: Diagnosis not present

## 2023-01-30 LAB — CBC WITH DIFFERENTIAL/PLATELET
Abs Immature Granulocytes: 0.03 10*3/uL (ref 0.00–0.07)
Basophils Absolute: 0 10*3/uL (ref 0.0–0.1)
Basophils Relative: 0 %
Eosinophils Absolute: 0 10*3/uL (ref 0.0–0.5)
Eosinophils Relative: 0 %
HCT: 36.6 % (ref 36.0–46.0)
Hemoglobin: 12.2 g/dL (ref 12.0–15.0)
Immature Granulocytes: 0 %
Lymphocytes Relative: 12 %
Lymphs Abs: 0.9 10*3/uL (ref 0.7–4.0)
MCH: 29.6 pg (ref 26.0–34.0)
MCHC: 33.3 g/dL (ref 30.0–36.0)
MCV: 88.8 fL (ref 80.0–100.0)
Monocytes Absolute: 0.2 10*3/uL (ref 0.1–1.0)
Monocytes Relative: 2 %
Neutro Abs: 6.2 10*3/uL (ref 1.7–7.7)
Neutrophils Relative %: 86 %
Platelets: 223 10*3/uL (ref 150–400)
RBC: 4.12 MIL/uL (ref 3.87–5.11)
RDW: 12.1 % (ref 11.5–15.5)
WBC: 7.3 10*3/uL (ref 4.0–10.5)
nRBC: 0 % (ref 0.0–0.2)

## 2023-01-30 LAB — GROUP A STREP BY PCR: Group A Strep by PCR: NOT DETECTED

## 2023-01-30 LAB — BASIC METABOLIC PANEL
Anion gap: 9 (ref 5–15)
BUN: 9 mg/dL (ref 6–20)
CO2: 24 mmol/L (ref 22–32)
Calcium: 8.9 mg/dL (ref 8.9–10.3)
Chloride: 102 mmol/L (ref 98–111)
Creatinine, Ser: 0.57 mg/dL (ref 0.44–1.00)
GFR, Estimated: >60 mL/min (ref >60)
Glucose, Bld: 94 mg/dL (ref 70–99)
Potassium: 3.9 mmol/L (ref 3.5–5.1)
Sodium: 135 mmol/L (ref 135–145)

## 2023-01-30 LAB — T4: T4, Total: 9.5 ug/dL (ref 4.5–12.0)

## 2023-01-30 LAB — TROPONIN I (HIGH SENSITIVITY): Troponin I (High Sensitivity): <2 ng/L (ref <18)

## 2023-01-30 MED ORDER — IOHEXOL 350 MG/ML SOLN
100.0000 mL | Freq: Once | INTRAVENOUS | Status: AC | PRN
  Administered 2023-01-30: 80 mL via INTRAVENOUS

## 2023-01-30 MED ORDER — HYDROXYZINE HCL 25 MG PO TABS: 25.0000 mg | ORAL_TABLET | Freq: Three times a day (TID) | ORAL | 0 refills | Status: AC | PRN

## 2023-01-30 NOTE — ED Triage Notes (Signed)
Pt via pov frrom home with throat and chest pain since yesterday. Pt reports that she had port placement on Thursday, chemo on Friday,and this started yesterday. Pt alert & oriented, nad noted.

## 2023-01-30 NOTE — Discharge Instructions (Signed)
Thank you for letting us take care of you today.  Overall, your workup was very reassuring.  You had normal cardiac enzymes.  Your EKG was normal.  Your chest x-ray showed that you report was appropriately positioned and did not appear to have any complications.  We did not see any blood clots on your CT scan.  Your vital signs are reassuring. I believe it is reasonable to discharge you home.  Your symptoms may be attributed to anxiety.  I am prescribing a medication for you to try to take at home to see if that will alleviate some of your symptoms.  Please take this medication only as needed.  You may benefit most from it prior to bedtime to help with sleep.  It does have some slight sedation as a side effect.  Please discuss with your primary care team next week any continued symptoms or concerns as well as management for any anxiety that you may be experiencing.  For any new or worsening symptoms, please return to the nearest emergency department for reevaluation.

## 2023-01-30 NOTE — ED Provider Notes (Signed)
[facility] AT [facility] Provider Note   CSN: [id number] Arrival date & time: 01/30/23  1602     History  Chief Complaint  Patient presents with   Chest Pain   Sore Throat    Suzanne Tucker is a 42 y.o. female with PMH of breast cancer currently undergoing IV infusion chemotherapy who presents to the ED complaining of chest pain and shortness of breath.  Patient states that yesterday she began having pain in her chest that makes it feel like it is harder to take a deep breath and that has continued into today prompting her to come to the ED for further evaluation.  She had a port placed to her chest wall 3 days ago without complication.  She had a PET scan approximately 1 week ago that showed no metastatic disease outside of her breast and lymph nodes.  She also had a normal brain scan at that time.  She had her first chemotherapy infusion 2 days ago.  No other new medications or treatments.  No calf pain or leg swelling.  No fever, chills, cough, congestion, abdominal pain, back pain, or other complaints.  No history of DVT/PE.  Patient worried for anxiety but states that she does not have a previous diagnosis of anxiety and all of her symptoms started yesterday.      Home Medications Prior to Admission medications   Medication Sig Start Date End Date Taking? Authorizing Provider  hydrOXYzine (ATARAX) 25 MG tablet Take 1 tablet (25 mg total) by mouth every 8 (eight) hours as needed for up to 3 days. 01/30/23 02/02/23 Yes Tyrece Vanterpool L, PA-C  acetaminophen (TYLENOL) 500 MG tablet Take 1,000 mg by mouth in the morning and at bedtime. Patient not taking: Reported on 01/26/2023    [provider]  amoxicillin (AMOXIL) 500 MG tablet Take 1 tablet (500 mg total) by mouth 2 (two) times daily. 01/26/23   Serena Croissant, MD  calcium carbonate (TUMS - DOSED IN MG ELEMENTAL CALCIUM) 500 MG chewable tablet Chew 1,000 mg by mouth as needed for indigestion or  heartburn. Patient not taking: Reported on 01/26/2023    [provider]  dexamethasone (DECADRON) 4 MG tablet Take 1 tablet (4 mg total) by mouth daily. Take with Adriamycin 1 tablet day after chemo and 1 tablet 2 days after chemo with food Patient not taking: Reported on 01/25/2023 01/19/23   Serena Croissant, MD  fexofenadine (ALLEGRA) 180 MG tablet Take 180 mg by mouth as needed for allergies or rhinitis. Patient not taking: Reported on 01/26/2023    [provider]  fluticasone (FLONASE) 50 MCG/ACT nasal spray Place 2 sprays into both nostrils daily. Patient not taking: Reported on 01/26/2023 12/02/22   Leath-Warren, Sadie Haber, NP  lidocaine-prilocaine (EMLA) cream Apply to affected area once Patient not taking: Reported on 01/25/2023 01/19/23   Serena Croissant, MD  ondansetron (ZOFRAN) 8 MG tablet Take 1 tablet (8 mg total) by mouth every 8 (eight) hours as needed for nausea or vomiting. Start on the third day after chemotherapy. Patient not taking: Reported on 01/25/2023 01/19/23   Serena Croissant, MD  oxyCODONE (OXY IR/ROXICODONE) 5 MG immediate release tablet Take 0.5-1 tablets (2.5-5 mg total) by mouth every 6 (six) hours as needed for severe pain. 01/27/23   [name], MD  prochlorperazine (COMPAZINE) 10 MG tablet Take 1 tablet (10 mg total) by mouth every 6 (six) hours as needed for nausea or vomiting. Patient not taking: Reported on 01/25/2023 01/19/23  Serena Croissant, MD      Allergies    Morphine; Tessalon [benzonatate]; Hydrocodone; Latex; Antihistamines, loratadine-type; Codeine; and Tramadol    Review of Systems   Review of Systems  All other systems reviewed and are negative.   Physical Exam Updated Vital Signs BP 113/74   Pulse 84   Resp 18   Ht 5\' 1"  (1.549 m)   Wt 65.8 kg   SpO2 100%   BMI 27.40 kg/m  Physical Exam Vitals and nursing note reviewed.  Constitutional:      General: She is not in acute distress.    Appearance: Normal appearance. She is not  ill-appearing or toxic-appearing.  HENT:     Head: Normocephalic and atraumatic.     Jaw: There is normal jaw occlusion.     Mouth/Throat:     Mouth: Mucous membranes are moist.     Pharynx: Oropharynx is clear. Uvula midline. No posterior oropharyngeal erythema or uvula swelling.     Tonsils: No tonsillar exudate or tonsillar abscesses.  Eyes:     Extraocular Movements: Extraocular movements intact.     Conjunctiva/sclera: Conjunctivae normal.  Neck:     Vascular: No JVD.  Cardiovascular:     Rate and Rhythm: Normal rate and regular rhythm.     Heart sounds: Normal heart sounds. No murmur heard.    No S3 or S4 sounds.  Pulmonary:     Effort: Pulmonary effort is normal. No tachypnea, accessory muscle usage or respiratory distress.     Breath sounds: Normal breath sounds. No decreased breath sounds, wheezing, rhonchi or rales.  Chest:     Comments: Port to the left chest wall that is nontender, no surrounding erythema or drainage, no increased warmth, no signs of infection Abdominal:     General: Abdomen is flat. There is no abdominal bruit.     Palpations: Abdomen is soft. There is no mass.     Tenderness: There is no abdominal tenderness. There is no guarding or rebound.  Musculoskeletal:        General: Normal range of motion.     Cervical back: Normal range of motion and neck supple.     Right lower leg: No tenderness. No edema.     Left lower leg: No tenderness. No edema.  Skin:    General: Skin is warm and dry.     Capillary Refill: Capillary refill takes less than 2 seconds.     Findings: No rash.  Neurological:     General: No focal deficit present.     Mental Status: She is alert and oriented to person, place, and time. Mental status is at baseline.  Psychiatric:        Mood and Affect: Mood is anxious.        Speech: Speech normal.        Behavior: Behavior is cooperative.        Thought Content: Thought content normal.        Judgment: Judgment normal.     ED  Results / Procedures / Treatments   Labs (all labs ordered are listed, but only abnormal results are displayed) Labs Reviewed  GROUP A STREP BY PCR  CBC WITH DIFFERENTIAL/PLATELET  BASIC METABOLIC PANEL  TROPONIN I (HIGH SENSITIVITY)    EKG EKG Interpretation  Date/Time:  Sunday January 30 2023 16:13:20 EDT Ventricular Rate:  92 PR Interval:  110 QRS Duration: 82 QT Interval:  342 QTC Calculation: 422 R Axis:   63 Text Interpretation: Sinus  rhythm with short PR ST & T wave abnormality, consider inferior ischemia Abnormal ECG When compared with ECG of 12-Mar-2019 16:37, No significant change was found Confirmed by Virgina Norfolk [phone]) on 01/30/2023 4:21:13 PM  Radiology CT Angio Chest PE W and/or Wo Contrast  Result Date: 01/30/2023 CLINICAL DATA:  Pulmonary embolism suspected. EXAM: CT ANGIOGRAPHY CHEST WITH CONTRAST TECHNIQUE: Multidetector CT imaging of the chest was performed using the standard protocol during bolus administration of intravenous contrast. Multiplanar CT image reconstructions and MIPs were obtained to evaluate the vascular anatomy. RADIATION DOSE REDUCTION: This exam was performed according to the departmental dose-optimization program which includes automated exposure control, adjustment of the mA and/or kV according to patient size and/or use of iterative reconstruction technique. CONTRAST:  80mL OMNIPAQUE IOHEXOL 350 MG/ML SOLN COMPARISON:  PET-CT January 25, 2023 FINDINGS: Cardiovascular: Satisfactory opacification of the pulmonary arteries to the segmental level. No evidence of pulmonary embolism. Normal heart size. No pericardial effusion. Mediastinum/Nodes: No enlarged mediastinal, or hilar lymph nodes. Thyroid gland, trachea, and esophagus demonstrate no significant findings. Markedly enlarged right axillary lymph nodes. Lungs/Pleura: Lungs are clear. No pleural effusion or pneumothorax. Upper Abdomen: No acute abnormality. Musculoskeletal: No acute or significant  osseous findings. Review of the MIP images confirms the above findings. IMPRESSION: 1. No evidence of pulmonary embolism. 2. Markedly enlarged right axillary lymph nodes. Electronically Signed   By: Ted Mcalpine M.D.   On: 01/30/2023 19:27   DG Chest Portable 1 View  Result Date: 01/30/2023 CLINICAL DATA:  Chest pain EXAM: PORTABLE CHEST 1 VIEW COMPARISON:  January 27, 2023 FINDINGS: Stable left Port-A-Cath. The cardiomediastinal silhouette is normal. No pneumothorax. Lungs are clear. IMPRESSION: No active disease. Electronically Signed   By: Gerome Sam III M.D.   On: 01/30/2023 17:03    Procedures Procedures    Medications Ordered in ED Medications  iohexol (OMNIPAQUE) 350 MG/ML injection 100 mL (80 mLs Intravenous Contrast Given 01/30/23 1836)    ED Course/ Medical Decision Making/ A&P                             Medical Decision Making Amount and/or Complexity of Data Reviewed Labs: ordered. Decision-making details documented in ED Course. Radiology: ordered. Decision-making details documented in ED Course. ECG/medicine tests: ordered. Decision-making details documented in ED Course.  Risk Prescription drug management.   Medical Decision Making:   Amayiah Gosnell is a 42 y.o. female who presented to the ED today with chest pain detailed above.    Additional history discussed with patient's family/caregivers.  Patient's presentation is complicated by their history of active breast cancer diagnosis currently undergoing chemotherapy.  Complete initial physical exam performed, notably the patient  was in the acute distress.  Regular rate and rhythm.  Lungs clear to auscultation.  Patient neurologically intact.  Abdomen soft nontender.  No signs of infection to the port of her left chest wall.  Patient is slightly anxious but has clear speech and normal thought content.    Reviewed and confirmed nursing documentation for past medical history, family history, social history.     Initial Assessment:   With the patient's presentation of chest pain, the emergent differential diagnosis of chest pain includes: Acute coronary syndrome, pericarditis, aortic dissection, pulmonary embolism, tension pneumothorax, and esophageal rupture.  I do not believe the patient has an emergent cause of chest pain, other urgent/non-acute considerations include, but are not limited to: chronic angina, aortic stenosis, cardiomyopathy, myocarditis, mitral  valve prolapse, pulmonary hypertension, hypertrophic obstructive cardiomyopathy (HOCM), aortic insufficiency, right ventricular hypertrophy, pneumonia, pleuritis, bronchitis, pneumothorax, tumor, gastroesophageal reflux disease (GERD), esophageal spasm, Mallory-Weiss syndrome, peptic ulcer disease, biliary disease, pancreatitis, functional gastrointestinal pain, cervical or thoracic disk disease or arthritis, shoulder arthritis, costochondritis, subacromial bursitis, anxiety or panic attack, herpes zoster, breast disorders, chest wall tumors, thoracic outlet syndrome, mediastinitis.    Initial Plan:  Screening labs including CBC and Metabolic panel to evaluate for infectious or metabolic etiology of disease.  Urinalysis with reflex culture ordered to evaluate for UTI or relevant urologic/nephrologic pathology.  CXR to evaluate for structural/infectious intrathoracic pathology.  EKG and troponin to evaluate for cardiac pathology Strep swab to assess for strep etiology of symptoms CT PE study to assess for pulmonary embolism or other intrathoracic pathology to explain patient's Objective evaluation as reviewed   Initial Study Results:   Laboratory  All laboratory results reviewed without evidence of clinically relevant pathology.    EKG EKG was reviewed independently. ST segments without concerns for elevations.   EKG: normal sinus rhythm.   Radiology:  All images reviewed independently. Agree with radiology report at this time.   CT  Angio Chest PE W and/or Wo Contrast  Result Date: 01/30/2023 CLINICAL DATA:  Pulmonary embolism suspected. EXAM: CT ANGIOGRAPHY CHEST WITH CONTRAST TECHNIQUE: Multidetector CT imaging of the chest was performed using the standard protocol during bolus administration of intravenous contrast. Multiplanar CT image reconstructions and MIPs were obtained to evaluate the vascular anatomy. RADIATION DOSE REDUCTION: This exam was performed according to the departmental dose-optimization program which includes automated exposure control, adjustment of the mA and/or kV according to patient size and/or use of iterative reconstruction technique. CONTRAST:  80mL OMNIPAQUE IOHEXOL 350 MG/ML SOLN COMPARISON:  PET-CT January 25, 2023 FINDINGS: Cardiovascular: Satisfactory opacification of the pulmonary arteries to the segmental level. No evidence of pulmonary embolism. Normal heart size. No pericardial effusion. Mediastinum/Nodes: No enlarged mediastinal, or hilar lymph nodes. Thyroid gland, trachea, and esophagus demonstrate no significant findings. Markedly enlarged right axillary lymph nodes. Lungs/Pleura: Lungs are clear. No pleural effusion or pneumothorax. Upper Abdomen: No acute abnormality. Musculoskeletal: No acute or significant osseous findings. Review of the MIP images confirms the above findings. IMPRESSION: 1. No evidence of pulmonary embolism. 2. Markedly enlarged right axillary lymph nodes. Electronically Signed   By: Ted Mcalpine M.D.   On: 01/30/2023 19:27   DG Chest Portable 1 View  Result Date: 01/30/2023 CLINICAL DATA:  Chest pain EXAM: PORTABLE CHEST 1 VIEW COMPARISON:  January 27, 2023 FINDINGS: Stable left Port-A-Cath. The cardiomediastinal silhouette is normal. No pneumothorax. Lungs are clear. IMPRESSION: No active disease. Electronically Signed   By: Gerome Sam III M.D.   On: 01/30/2023 17:03   DG CHEST PORT 1 VIEW  Result Date: 01/27/2023 CLINICAL DATA:  Port-A-Cath placement EXAM:  PORTABLE CHEST 1 VIEW COMPARISON:  Portable exam 0914 hours compared to 08/10/2022 FINDINGS: LEFT subclavian Port-A-Cath with tip projecting over SVC. Normal heart size, mediastinal contours, and pulmonary vascularity. Lungs clear. No acute infiltrate, pleural effusion, or pneumothorax. Osseous structures unremarkable. IMPRESSION: No acute abnormalities. Electronically Signed   By: Ulyses Southward M.D.   On: 01/27/2023 09:31   DG C-Arm 1-60 Min-No Report  Result Date: 01/27/2023 Fluoroscopy was utilized by the requesting physician.  No radiographic interpretation.   NM PET Image Initial (PI) Skull Base To Thigh (F-18 FDG)  Result Date: 01/25/2023 CLINICAL DATA:  Initial treatment strategy for stage IV breast cancer. EXAM: NUCLEAR MEDICINE PET SKULL  BASE TO THIGH TECHNIQUE: 7.25 mCi F-18 FDG was injected intravenously. Full-ring PET imaging was performed from the skull base to thigh after the radiotracer. CT data was obtained and used for attenuation correction and anatomic localization. Fasting blood glucose: 104 mg/dl COMPARISON:  None Available. FINDINGS: Mediastinal blood pool activity: SUV max 2.58 Liver activity: SUV max NA NECK: Multiple tracer avid right level 5A and 5B lymph nodes. Index right level 5A lymph node measures 1 cm and has an SUV max of 6.88, image 35/4. Right level 5B lymph node measures 0.8 cm with SUV max of 7.55, image 36/4. Incidental CT findings: None. CHEST: Tracer avid lesion within the inferior medial right breast containing biopsy clip measures 1 cm with SUV max of 3.61, image 80/4. Extensive right axillary and retropectoral lymph nodes are identified, including: -right axillary lymph node measures 4.6 x 2.3 cm with SUV max of 8.66. -Right retropectoral lymph node measures 1.2 cm with SUV max of 9.07, image 47/4. No tracer avid internal mammary, mediastinal or hilar lymph nodes. No tracer avid left supraclavicular or left axillary lymph nodes. No suspicious pulmonary nodules  identified. Incidental CT findings: None. ABDOMEN/PELVIS: No abnormal tracer uptake identified within the liver, pancreas, spleen, or adrenal glands. No tracer avid abdominopelvic lymph nodes. Asymmetric increased uptake localizing to the left ovary has an SUV max of 8.24, image 158/4. Incidental CT findings: Gallstones.  Status post hysterectomy. SKELETON: No focal hypermetabolic activity to suggest skeletal metastasis. Incidental CT findings: None. IMPRESSION: 1. Tracer avid lesion within the inferior medial right breast containing biopsy clip is identified compatible with primary breast carcinoma. 2. Extensive tracer avid right axillary and retropectoral lymph nodes compatible with metastatic adenopathy. 3. FDG avid right level 5A and 5B cervical lymph nodes compatible with metastatic adenopathy. 4. No signs of tracer avid distant metastatic disease. 5. Asymmetric increased uptake localizing to the left ovary is noted. This is a nonspecific finding and may be physiologic in a premenopausal female. If the patient is postmenopausal then further evaluation with pelvic sonogram is advised. 6. Gallstones. Electronically Signed   By: Signa Kell M.D.   On: 01/25/2023 16:17   MR Brain W Wo Contrast  Result Date: 01/22/2023 CLINICAL DATA:  42 year old female recently diagnosed with breast cancer, metastatic lymph node. Staging. EXAM: MRI HEAD WITHOUT AND WITH CONTRAST TECHNIQUE: Multiplanar, multiecho pulse sequences of the brain and surrounding structures were obtained without and with intravenous contrast. CONTRAST:  7mL GADAVIST GADOBUTROL 1 MMOL/ML IV SOLN COMPARISON:  Previous brain MRI 11/08/2017. FINDINGS: Brain: No abnormal enhancement identified. No midline shift, mass effect, or evidence of intracranial mass lesion. No dural thickening. Normal cerebral volume. No restricted diffusion to suggest acute infarction. No ventriculomegaly, extra-axial collection or acute intracranial hemorrhage.  Cervicomedullary junction and pituitary are within normal limits. Wallace Cullens and white matter signal remains normal for age throughout the brain. No encephalomalacia or chronic cerebral blood products identified. Vascular: Major intracranial vascular flow voids are stable. Distal left vertebral artery is dominant. Major dural venous sinuses are enhancing and appear patent. Skull and upper cervical spine: Visualized bone marrow signal is within normal limits. Negative visible cervical spine and spinal cord. Sinuses/Orbits: Stable and negative orbits. Paranasal sinuses now clear. Other: Mastoids are clear. Visible internal auditory structures appear normal. Negative visible scalp and face. IMPRESSION: No metastatic disease or acute intracranial abnormality. Stable and normal MRI appearance of the Brain. Electronically Signed   By: Odessa Fleming M.D.   On: 01/22/2023 08:44   MR BREAST  BILATERAL W WO CONTRAST INC CAD  Result Date: 01/19/2023 CLINICAL DATA:  Staging exam. Newly diagnosed right breast cancer with metastatic lymph node. EXAM: BILATERAL BREAST MRI WITH AND WITHOUT CONTRAST TECHNIQUE: Multiplanar, multisequence MR images of both breasts were obtained prior to and following the intravenous administration of 7 ml of Vueway Three-dimensional MR images were rendered by post-processing of the original MR data on an independent workstation. The three-dimensional MR images were interpreted, and findings are reported in the following complete MRI report for this study. Three dimensional images were evaluated at the independent interpreting workstation using the DynaCAD thin client. COMPARISON:  None available. FINDINGS: Breast composition: c. Heterogeneous fibroglandular tissue. Background parenchymal enhancement: Mild Right breast: There is an irregular enhancing mass in the lower inner right breast measuring 2.2 x 0.7 x 0.9 cm (series 6, image 95). There is additional linear non mass enhancement extending posterior to the  mass measuring 2.1 cm (series 5, image 95). There is susceptibility artifact within the enhancing mass consistent with a biopsy marking clip. No additional suspicious enhancing mass or non mass enhancement elsewhere in the right breast. Left breast: No mass or abnormal enhancement. Lymph nodes: No abnormal appearing lymph nodes. Ancillary findings: Bulky right axillary lymphadenopathy involving levels I, II, and III. No left axillary adenopathy. IMPRESSION: 1. Biopsy-proven malignancy in the lower inner right breast measuring 2.2 cm. 2. Additional linear non mass enhancement extending posterior to the dominant mass measuring 2.1 cm. 3. Bulky right axillary lymphadenopathy involving axillary levels I, II, and III. 4. No MRI evidence of malignancy in the left breast. RECOMMENDATION: If breast conservation is desired recommend MRI guided core needle biopsy of the 2.1 cm non mass enhancement in the right breast. BI-RADS CATEGORY  4: Suspicious. Electronically Signed   By: Emmaline Kluver M.D.   On: 01/19/2023 12:41     Final Assessment and Plan:   42 year old female presents to the ED for evaluation of chest pain and shortness of breath.  Patient currently undergoing treatment for breast cancer.  Had her first treatment this week.  Sudden onset of symptoms yesterday.  Vital signs are within normal limits without tachycardia, hypotension, or hypoxia.  On exam, patient's lungs are clear to auscultation.  She does appear slightly anxious.  She has no history of diagnosed anxiety.  She had a port placed to her chest wall this week.  This appears well on exam as well.  Workup initiated from triage and edited as appropriate.  Chest x-ray showed no complications from port placement.  Overall, blood work is very reassuring without any significant abnormalities.  Strep swab is negative. Pt with significant concern regarding symptoms and no history of this. No history of underlying lung disease. Negative troponin, does not  sound consistent with ACS. She had a PET scan within the last week that showed no metastatic disease to the lungs.  With this, discussed with patient and neighbor at bedside with recommendation for CT PE study to rule out other emergent cause of symptoms.  Patient agreeable to this study. No PE identified.  Discussed findings with patient who is very reassured with CT results.  Will have her follow-up closely with her outpatient team for any continued symptoms.  Will give Atarax for home to help with any anxiety and see if this relieves the symptoms.  Patient agreeable to this.  Patient has remained stable throughout ED stay.  Strict ED return precautions given, all questions answered, and stable for discharge.   Clinical Impression:  1. Pleuritic chest pain   2. Anxiety      Discharge           Final Clinical Impression(s) / ED Diagnoses Final diagnoses:  Pleuritic chest pain  Anxiety    Rx / DC Orders ED Discharge Orders          Ordered    hydrOXYzine (ATARAX) 25 MG tablet  Every 8 hours PRN        01/30/23 1941              Tonette Lederer, PA-C 01/30/23 2242    Virgina Norfolk, DO 01/30/23 2243

## 2023-01-30 NOTE — ED Notes (Signed)
Patient transported to CT 

## 2023-01-31 ENCOUNTER — Telehealth: Payer: Self-pay

## 2023-01-31 NOTE — Transitions of Care (Post Inpatient/ED Visit) (Signed)
   01/31/2023  Name: Suzanne Tucker MRN: [id number] DOB: December 16, 1980  Today's TOC FU Call Status: Today's TOC FU Call Status:: Unsuccessul Call (1st Attempt) Unsuccessful Call (1st Attempt) Date: 01/31/23  Attempted to reach the patient regarding the most recent Inpatient/ED visit.  Follow Up Plan: Additional outreach attempts will be made to reach the patient to complete the Transitions of Care (Post Inpatient/ED visit) call.   Signature Karena Addison, LPN [facility] Nurse Health Advisor Direct Dial [phone]

## 2023-01-31 NOTE — Telephone Encounter (Signed)
-----   Message from Arville Care, RN sent at 01/28/2023  4:20 PM EDT ----- Regarding: [name] 1st Tx F/U Call - Rande Lawman, Taxol, Dollene Primrose 1st Tx F/U call - Keytruda, Taxol, Carbo.  Tolerated infusion well.

## 2023-01-31 NOTE — Telephone Encounter (Signed)
Called pt to see how she was doing post chemo.  She went to ED over w/e with pleuritic pain but no reason was found for her symptoms.  They felt that she may have had an anxiety attack.  She reports low grade fever  of  99.1 axillary.  She reports drinking & eating well.  Encouraged to continue with fluids & to call if worse or any other signs of infections.  She reports having fever before starting treatment.  Message routed to Dr Caesar Chestnut RN

## 2023-02-01 ENCOUNTER — Encounter: Payer: Self-pay | Admitting: General Practice

## 2023-02-01 ENCOUNTER — Other Ambulatory Visit: Payer: Self-pay

## 2023-02-01 ENCOUNTER — Encounter: Payer: Self-pay | Admitting: *Deleted

## 2023-02-01 ENCOUNTER — Inpatient Hospital Stay (HOSPITAL_BASED_OUTPATIENT_CLINIC_OR_DEPARTMENT_OTHER): Payer: BC Managed Care – PPO | Admitting: Physician Assistant

## 2023-02-01 ENCOUNTER — Inpatient Hospital Stay: Payer: BC Managed Care – PPO

## 2023-02-01 VITALS — BP 116/83 | HR 87 | Temp 98.6°F | Resp 20 | Wt 146.2 lb

## 2023-02-01 DIAGNOSIS — Z9079 Acquired absence of other genital organ(s): Secondary | ICD-10-CM | POA: Diagnosis not present

## 2023-02-01 DIAGNOSIS — Z95828 Presence of other vascular implants and grafts: Secondary | ICD-10-CM | POA: Diagnosis not present

## 2023-02-01 DIAGNOSIS — C50311 Malignant neoplasm of lower-inner quadrant of right female breast: Secondary | ICD-10-CM

## 2023-02-01 DIAGNOSIS — Z90710 Acquired absence of both cervix and uterus: Secondary | ICD-10-CM | POA: Diagnosis not present

## 2023-02-01 DIAGNOSIS — Z5111 Encounter for antineoplastic chemotherapy: Secondary | ICD-10-CM | POA: Diagnosis not present

## 2023-02-01 DIAGNOSIS — Z171 Estrogen receptor negative status [ER-]: Secondary | ICD-10-CM

## 2023-02-01 DIAGNOSIS — Z79899 Other long term (current) drug therapy: Secondary | ICD-10-CM | POA: Diagnosis not present

## 2023-02-01 LAB — CMP (CANCER CENTER ONLY)
ALT: 14 U/L (ref 0–44)
AST: 18 U/L (ref 15–41)
Albumin: 4.2 g/dL (ref 3.5–5.0)
Alkaline Phosphatase: 41 U/L (ref 38–126)
Anion gap: 5 (ref 5–15)
BUN: 6 mg/dL (ref 6–20)
CO2: 27 mmol/L (ref 22–32)
Calcium: 9.3 mg/dL (ref 8.9–10.3)
Chloride: 104 mmol/L (ref 98–111)
Creatinine: 0.58 mg/dL (ref 0.44–1.00)
GFR, Estimated: >60 mL/min (ref >60)
Glucose, Bld: 100 mg/dL — ABNORMAL HIGH (ref 70–99)
Potassium: 3.9 mmol/L (ref 3.5–5.1)
Sodium: 136 mmol/L (ref 135–145)
Total Bilirubin: 0.6 mg/dL (ref 0.3–1.2)
Total Protein: 6.7 g/dL (ref 6.5–8.1)

## 2023-02-01 LAB — CBC WITH DIFFERENTIAL (CANCER CENTER ONLY)
Abs Immature Granulocytes: 0.01 10*3/uL (ref 0.00–0.07)
Basophils Absolute: 0 10*3/uL (ref 0.0–0.1)
Basophils Relative: 0 %
Eosinophils Absolute: 0.1 10*3/uL (ref 0.0–0.5)
Eosinophils Relative: 1 %
HCT: 33.6 % — ABNORMAL LOW (ref 36.0–46.0)
Hemoglobin: 11.4 g/dL — ABNORMAL LOW (ref 12.0–15.0)
Immature Granulocytes: 0 %
Lymphocytes Relative: 19 %
Lymphs Abs: 0.8 10*3/uL (ref 0.7–4.0)
MCH: 30 pg (ref 26.0–34.0)
MCHC: 33.9 g/dL (ref 30.0–36.0)
MCV: 88.4 fL (ref 80.0–100.0)
Monocytes Absolute: 0.2 10*3/uL (ref 0.1–1.0)
Monocytes Relative: 3 %
Neutro Abs: 3.4 10*3/uL (ref 1.7–7.7)
Neutrophils Relative %: 77 %
Platelet Count: 217 10*3/uL (ref 150–400)
RBC: 3.8 MIL/uL — ABNORMAL LOW (ref 3.87–5.11)
RDW: 11.9 % (ref 11.5–15.5)
WBC Count: 4.4 10*3/uL (ref 4.0–10.5)
nRBC: 0 % (ref 0.0–0.2)

## 2023-02-01 MED ORDER — SODIUM CHLORIDE 0.9% FLUSH: 10.0000 mL | Freq: Once | INTRAVENOUS | Status: DC

## 2023-02-01 NOTE — Progress Notes (Signed)
Received mychart message from pt with complaint of ongoing fever of 100.0-100.1 with chills, fatigue and night sweats x several days. Per MD pt needing [facility] visit for further evaluation. Appt scheduled, pt notified and verbalized understanding.

## 2023-02-01 NOTE — Progress Notes (Signed)
[facility] Spiritual Care Note  Attempted follow-up call, leaving voicemail with direct number. Will try again later in week as needed.   [address] [place], [place], [facility] Pager [phone] Voicemail [phone]

## 2023-02-01 NOTE — Progress Notes (Signed)
Symptom Management Consult Note [facility]    Patient Care Team: Georgina Quint, MD as PCP - General (Internal Medicine) Corky Crafts, MD as PCP - Cardiology (Cardiology) Serena Croissant, MD as Consulting Physician (Hematology and Oncology) Lonie Peak, MD as Attending Physician (Radiation Oncology) [name], MD as Consulting Physician (General Surgery) Donnelly Angelica, RN as Oncology Nurse Navigator Pershing Proud, RN as Oncology Nurse Navigator    Name / MRN / DOBNiaomi Tucker  [id number]  [birth year]   Date of visit: 02/01/2023   Chief Complaint/Reason for visit: fever, wound check   Current Therapy: carboplatin, paclitaxel, pembrolizumab  Last treatment:  Day 1   Cycle 1 on 01/28/23   ASSESSMENT & PLAN: Patient is a 42 y.o. 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 [name].  I have viewed most recent oncology note and lab work.    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 Has not had temperature greater than 99.1. Had axillary temp of 99.1 and was told to always add 1 degree, so never actually measure over 99.1/ - CBC without neutropenia. WBC is 4.4, lower range of normal. Discussed neutropenic precautions for extra precaution. Exam today shows well healing port in left chest. - Chart review shows patient had thorough work up of chest pain in ED x 2 days ago. CTA chest and chest xray were negative for infection. - Patient reporting night sweats since her hysterectomy in 2020, this is not a new problem since starting chemotherapy. Discussed with patient chemotherapy could be contributing to this with all the changes it also is  causing internally. - Encouraged patient to use the same thermometer and only check oral temperature for consistency. - Patient encouraged to RTC if febrile or seek ED evaluation.   #Wound check - Chart review shows patient had core need biopsy right breast and right axillary lymph node needle biopsy on 01/10/23. - She continues to have right axillary pain and is concerned for infection. - Exam without signs of cellulitis, biopsy site looks to be healing well. Still have palpable right axillary lymphadenopathy, which is to be expected as she has only completed on cycle of treatment so far. - Discussed with patient signs of infection to watch for. No additional imaging or workup warranted at this time.   Heme/Onc History: Oncology History  Malignant neoplasm of lower-inner quadrant of right breast of female, estrogen receptor negative (HCC)  01/10/2023 Initial Diagnosis   Palpable lump right axilla x 2 weeks bulky axillary lymphadenopathy ultrasound breast: 2 masses at 4 o'clock position 1.1 cm and 0.6 cm multiple axillary lymph nodes largest 4.5 cm also level 2 and 3 (subpectoral lymph node as well): Biopsy: Grade 3 IDC ER 0%, PR 0%, Ki-67 95%, HER2 1+   01/28/2023 -  Chemotherapy   Patient is on Treatment Plan : BREAST Pembrolizumab (200) D1 + Carboplatin (1.5) D1,8,15 + Paclitaxel (80) D1,8,15 q21d X 4 cycles / Pembrolizumab (200) D1 + AC D1 q21d x 4 cycles         Interval history-: Suzanne Tucker is a 42 y.o. female with oncologic history as above presenting to [facility] today with chief complaint of fever and wound check. She presents unaccompanied to  clinic.  Patient reports temperature have been as high as 100.1. She is checking her temperature in both axillas and oral. She reports her axillary temperature was 99.1 and she was told to add one degree to get the oral equivalent. She has had different readings for each under arm. She admits to feeling neurotic about checking her temperature  and is worried she has an infection. She denies any respiratory or urinary symptoms. No sick contacts. She does report having night sweats for the last 3 weeks that wake her up from her sleep most nights. She admits having night sweats ever since her hysterectomy in 2020, however they were not this frequent as they are now. Patient also reports pain at her biopsy site in right axilla stating it is still sore. She has no bleeding or purulent drainage. She took 1000 mg tylenol at 7a this morning.      ROS  All other systems are reviewed and are negative for acute change except as noted in the HPI.    Allergies  Allergen Reactions   Morphine Anaphylaxis and Shortness Of Breath   Tessalon [Benzonatate] Hives    Hives, itching, burning all over.    Hydrocodone Hives   Latex Rash   Antihistamines, Loratadine-Type Other (See Comments)    Patient reports numbness in tongue   Codeine Nausea And Vomiting   Tramadol Other (See Comments)    Tingling in the mouth with swelling      Past Medical History:  Diagnosis Date   Anginal pain (HCC) 02/2019   related to MVP   Back pain    Breast cancer (HCC)    Depression    Dysrhythmia    MVP   Mitral prolapse      Past Surgical History:  Procedure Laterality Date   BREAST SURGERY Right 2024   Breast biopsy x 2   CESAREAN SECTION  03/30/2008   PORTACATH PLACEMENT N/A 01/27/2023   Procedure: INSERTION PORT-A-CATH;  Surgeon: [name], MD;  Location: [facility];  Service: General;  Laterality: N/A;  leaving accessed   ROBOTIC ASSISTED TOTAL HYSTERECTOMY Bilateral 03/15/2019   Procedure: XI ROBOTIC ASSISTED TOTAL HYSTERECTOMY with Bilateral Salpingectomy;  Surgeon: Silverio Lay, MD;  Location: [facility];  Service: Gynecology;  Laterality: Bilateral;   TUBAL LIGATION  2017    Social History   Socioeconomic History   Marital status: Single    Spouse name: Not on file   Number of children: Not on file   Years of  education: Not on file   Highest education level: Master's degree (e.g., MA, MS, MEng, MEd, MSW, MBA)  Occupational History   Not on file  Tobacco Use   Smoking status: Never   Smokeless tobacco: Never  Vaping Use   Vaping Use: Never used  Substance and Sexual Activity   Alcohol use: Not Currently    Comment:     Drug use: No   Sexual activity: Yes    Birth control/protection: Surgical  Other Topics Concern   Not on file  Social History Narrative   Not on file   Social Determinants of Health   Financial Resource Strain: Low Risk  (12/27/2022)   Overall Financial Resource Strain (CARDIA)    Difficulty of Paying Living Expenses: Not very hard  Food Insecurity: No Food Insecurity (12/27/2022)   Hunger Vital Sign    Worried About Running Out of Food in the Last Year: Never true    Ran Out of Food in the Last  Year: Never true  Transportation Needs: No Transportation Needs (12/27/2022)   PRAPARE - Administrator, Civil Service (Medical): No    Lack of Transportation (Non-Medical): No  Physical Activity: Insufficiently Active (12/27/2022)   Exercise Vital Sign    Days of Exercise per Week: 4 days    Minutes of Exercise per Session: 20 min  Stress: No Stress Concern Present (12/27/2022)   [employer] of Occupational Health - Occupational Stress Questionnaire    Feeling of Stress : Only a little  Social Connections: Moderately Isolated (12/27/2022)   Social Connection and Isolation Panel [NHANES]    Frequency of Communication with Friends and Family: More than three times a week    Frequency of Social Gatherings with Friends and Family: Once a week    Attends Religious Services: Never    Database administrator or Organizations: No    Attends Engineer, structural: Not on file    Marital Status: Married  Catering manager Violence: Not on file    Family History  Problem Relation Age of Onset   Stroke Mother    Alzheimer's disease Father    Diabetes  Maternal Grandmother    Heart disease Maternal Grandmother    Heart attack Maternal Grandmother    Diabetes Maternal Grandfather    Heart disease Maternal Grandfather    Alzheimer's disease Paternal Grandmother    Dementia Paternal Grandmother    Alzheimer's disease Paternal Grandfather    Dementia Paternal Grandfather    Alcohol abuse Paternal Grandfather      Current Outpatient Medications:    acetaminophen (TYLENOL) 500 MG tablet, Take 1,000 mg by mouth in the morning and at bedtime. (Patient not taking: Reported on 01/26/2023), Disp: , Rfl:    amoxicillin (AMOXIL) 500 MG tablet, Take 1 tablet (500 mg total) by mouth 2 (two) times daily., Disp: 14 tablet, Rfl: 0   calcium carbonate (TUMS - DOSED IN MG ELEMENTAL CALCIUM) 500 MG chewable tablet, Chew 1,000 mg by mouth as needed for indigestion or heartburn. (Patient not taking: Reported on 01/26/2023), Disp: , Rfl:    dexamethasone (DECADRON) 4 MG tablet, Take 1 tablet (4 mg total) by mouth daily. Take with Adriamycin 1 tablet day after chemo and 1 tablet 2 days after chemo with food (Patient not taking: Reported on 01/25/2023), Disp: 8 tablet, Rfl: 1   fexofenadine (ALLEGRA) 180 MG tablet, Take 180 mg by mouth as needed for allergies or rhinitis. (Patient not taking: Reported on 01/26/2023), Disp: , Rfl:    fluticasone (FLONASE) 50 MCG/ACT nasal spray, Place 2 sprays into both nostrils daily. (Patient not taking: Reported on 01/26/2023), Disp: 16 g, Rfl: 0   hydrOXYzine (ATARAX) 25 MG tablet, Take 1 tablet (25 mg total) by mouth every 8 (eight) hours as needed for up to 3 days., Disp: 9 tablet, Rfl: 0   lidocaine-prilocaine (EMLA) cream, Apply to affected area once (Patient not taking: Reported on 01/25/2023), Disp: 30 g, Rfl: 3   ondansetron (ZOFRAN) 8 MG tablet, Take 1 tablet (8 mg total) by mouth every 8 (eight) hours as needed for nausea or vomiting. Start on the third day after chemotherapy. (Patient not taking: Reported on 01/25/2023), Disp:  30 tablet, Rfl: 1   oxyCODONE (OXY IR/ROXICODONE) 5 MG immediate release tablet, Take 0.5-1 tablets (2.5-5 mg total) by mouth every 6 (six) hours as needed for severe pain., Disp: 5 tablet, Rfl: 0   prochlorperazine (COMPAZINE) 10 MG tablet, Take 1 tablet (10 mg total) by  mouth every 6 (six) hours as needed for nausea or vomiting. (Patient not taking: Reported on 01/25/2023), Disp: 30 tablet, Rfl: 1  PHYSICAL EXAM: ECOG FS:1 - Symptomatic but completely ambulatory    Vitals:   02/01/23 1338  BP: 116/83  Pulse: 87  Resp: 20  Temp: 98.6 F (37 C)  SpO2: 100%  Weight: 146 lb 3.2 oz (66.3 kg)   Physical Exam Vitals and nursing note reviewed.  Constitutional:      Appearance: She is not ill-appearing or toxic-appearing.  HENT:     Head: Normocephalic.  Eyes:     Conjunctiva/sclera: Conjunctivae normal.  Cardiovascular:     Rate and Rhythm: Normal rate.  Pulmonary:     Effort: Pulmonary effort is normal.  Chest:     Comments: Port in left chest with faint ecchymosis. No dehiscence  Abdominal:     General: There is no distension.  Musculoskeletal:        General: Normal range of motion.     Cervical back: Normal range of motion.     Comments: Compartments soft in RUE.  Lymphadenopathy:     Upper Body:     Right upper body: Axillary adenopathy present.  Skin:    General: Skin is warm and dry.     Comments: Biopsy site in right axilla without erythema. No bleeding or drainage.  Neurological:     Mental Status: She is alert.        LABORATORY DATA: I have reviewed the data as listed    Latest Ref Rng & Units 02/01/2023    1:16 PM 01/30/2023    4:25 PM 01/28/2023   10:08 AM  CBC  WBC 4.0 - 10.5 K/uL 4.4  7.3  8.0   Hemoglobin 12.0 - 15.0 g/dL 16.1  09.6  04.5   Hematocrit 36.0 - 46.0 % 33.6  36.6  35.6   Platelets 150 - 400 K/uL 217  223  248         Latest Ref Rng & Units 02/01/2023    1:16 PM 01/30/2023    4:25 PM 01/28/2023   10:08 AM  CMP  Glucose 70 - 99  mg/dL 409  94  87   BUN 6 - 20 mg/dL 6  9  6    Creatinine 0.44 - 1.00 mg/dL 8.11  9.14  7.82   Sodium 135 - 145 mmol/L 136  135  137   Potassium 3.5 - 5.1 mmol/L 3.9  3.9  3.6   Chloride 98 - 111 mmol/L 104  102  104   CO2 22 - 32 mmol/L 27  24  27    Calcium 8.9 - 10.3 mg/dL 9.3  8.9  9.3   Total Protein 6.5 - 8.1 g/dL 6.7   6.9   Total Bilirubin 0.3 - 1.2 mg/dL 0.6   0.6   Alkaline Phos 38 - 126 U/L 41   45   AST 15 - 41 U/L 18   15   ALT 0 - 44 U/L 14   7        RADIOGRAPHIC STUDIES (from last 24 hours if applicable) I have personally reviewed the radiological images as listed and agreed with the findings in the report. No results found.      Visit Diagnosis: 1. Port-A-Cath in place   2. Malignant neoplasm of lower-inner quadrant of right breast of female, estrogen receptor negative (HCC)      No orders of the defined types were placed in this encounter.  All questions were answered. The patient knows to call the clinic with any problems, questions or concerns. No barriers to learning was detected.  A total of more than 20 minutes were spent on this encounter with face-to-face time and non-face-to-face time, including preparing to see the patient, ordering tests and/or medications, counseling the patient and coordination of care as outlined above.    Thank you for allowing me to participate in the care of this patient.    Shanon Ace, PA-C Department of Hematology/Oncology [facility] Phone: [phone]  [phone]    02/01/2023 9:52 PM

## 2023-02-03 MED FILL — Dexamethasone Sodium Phosphate Inj 100 MG/10ML: INTRAMUSCULAR | Qty: 1 | Status: AC

## 2023-02-04 ENCOUNTER — Encounter: Payer: Self-pay | Admitting: General Practice

## 2023-02-04 ENCOUNTER — Inpatient Hospital Stay: Payer: BC Managed Care – PPO | Attending: Hematology and Oncology

## 2023-02-04 ENCOUNTER — Inpatient Hospital Stay (HOSPITAL_BASED_OUTPATIENT_CLINIC_OR_DEPARTMENT_OTHER): Payer: BC Managed Care – PPO | Admitting: Hematology and Oncology

## 2023-02-04 ENCOUNTER — Inpatient Hospital Stay: Payer: BC Managed Care – PPO

## 2023-02-04 VITALS — BP 106/79 | HR 92 | Resp 18

## 2023-02-04 VITALS — BP 123/80 | HR 84 | Temp 97.8°F | Resp 18 | Ht 61.0 in | Wt 144.5 lb

## 2023-02-04 DIAGNOSIS — C50311 Malignant neoplasm of lower-inner quadrant of right female breast: Secondary | ICD-10-CM | POA: Diagnosis not present

## 2023-02-04 DIAGNOSIS — Z95828 Presence of other vascular implants and grafts: Secondary | ICD-10-CM

## 2023-02-04 DIAGNOSIS — Z5111 Encounter for antineoplastic chemotherapy: Secondary | ICD-10-CM | POA: Insufficient documentation

## 2023-02-04 DIAGNOSIS — Z171 Estrogen receptor negative status [ER-]: Secondary | ICD-10-CM

## 2023-02-04 DIAGNOSIS — Z79899 Other long term (current) drug therapy: Secondary | ICD-10-CM | POA: Insufficient documentation

## 2023-02-04 LAB — CBC WITH DIFFERENTIAL (CANCER CENTER ONLY)
Abs Immature Granulocytes: 0.02 10*3/uL (ref 0.00–0.07)
Basophils Absolute: 0 10*3/uL (ref 0.0–0.1)
Basophils Relative: 1 %
Eosinophils Absolute: 0.1 10*3/uL (ref 0.0–0.5)
Eosinophils Relative: 3 %
HCT: 32.5 % — ABNORMAL LOW (ref 36.0–46.0)
Hemoglobin: 11.3 g/dL — ABNORMAL LOW (ref 12.0–15.0)
Immature Granulocytes: 1 %
Lymphocytes Relative: 26 %
Lymphs Abs: 0.9 10*3/uL (ref 0.7–4.0)
MCH: 31 pg (ref 26.0–34.0)
MCHC: 34.8 g/dL (ref 30.0–36.0)
MCV: 89 fL (ref 80.0–100.0)
Monocytes Absolute: 0.2 10*3/uL (ref 0.1–1.0)
Monocytes Relative: 7 %
Neutro Abs: 2.3 10*3/uL (ref 1.7–7.7)
Neutrophils Relative %: 62 %
Platelet Count: 230 10*3/uL (ref 150–400)
RBC: 3.65 MIL/uL — ABNORMAL LOW (ref 3.87–5.11)
RDW: 12 % (ref 11.5–15.5)
WBC Count: 3.6 10*3/uL — ABNORMAL LOW (ref 4.0–10.5)
nRBC: 0 % (ref 0.0–0.2)

## 2023-02-04 LAB — CMP (CANCER CENTER ONLY)
ALT: 11 U/L (ref 0–44)
AST: 16 U/L (ref 15–41)
Albumin: 4.3 g/dL (ref 3.5–5.0)
Alkaline Phosphatase: 41 U/L (ref 38–126)
Anion gap: 6 (ref 5–15)
BUN: 10 mg/dL (ref 6–20)
CO2: 28 mmol/L (ref 22–32)
Calcium: 9.4 mg/dL (ref 8.9–10.3)
Chloride: 104 mmol/L (ref 98–111)
Creatinine: 0.57 mg/dL (ref 0.44–1.00)
GFR, Estimated: >60 mL/min (ref >60)
Glucose, Bld: 94 mg/dL (ref 70–99)
Potassium: 3.8 mmol/L (ref 3.5–5.1)
Sodium: 138 mmol/L (ref 135–145)
Total Bilirubin: 0.5 mg/dL (ref 0.3–1.2)
Total Protein: 6.9 g/dL (ref 6.5–8.1)

## 2023-02-04 MED ORDER — SODIUM CHLORIDE 0.9 % IV SOLN
187.2000 mg | Freq: Once | INTRAVENOUS | Status: AC
  Administered 2023-02-04: 190 mg via INTRAVENOUS
  Filled 2023-02-04: qty 19

## 2023-02-04 MED ORDER — SODIUM CHLORIDE 0.9 % IV SOLN: Freq: Once | INTRAVENOUS | Status: AC

## 2023-02-04 MED ORDER — SODIUM CHLORIDE 0.9 % IV SOLN
10.0000 mg | Freq: Once | INTRAVENOUS | Status: AC
  Administered 2023-02-04: 10 mg via INTRAVENOUS
  Filled 2023-02-04: qty 10

## 2023-02-04 MED ORDER — SODIUM CHLORIDE 0.9% FLUSH
10.0000 mL | INTRAVENOUS | Status: DC | PRN
  Administered 2023-02-04: 10 mL

## 2023-02-04 MED ORDER — DIPHENHYDRAMINE HCL 50 MG/ML IJ SOLN
50.0000 mg | Freq: Once | INTRAMUSCULAR | Status: AC
  Administered 2023-02-04: 50 mg via INTRAVENOUS
  Filled 2023-02-04: qty 1

## 2023-02-04 MED ORDER — SODIUM CHLORIDE 0.9% FLUSH
10.0000 mL | Freq: Once | INTRAVENOUS | Status: AC
  Administered 2023-02-04: 10 mL

## 2023-02-04 MED ORDER — SODIUM CHLORIDE 0.9 % IV SOLN
80.0000 mg/m2 | Freq: Once | INTRAVENOUS | Status: AC
  Administered 2023-02-04: 138 mg via INTRAVENOUS
  Filled 2023-02-04: qty 23

## 2023-02-04 MED ORDER — FAMOTIDINE 20 MG IN NS 100 ML IVPB
20.0000 mg | Freq: Once | INTRAVENOUS | Status: AC
  Administered 2023-02-04: 20 mg via INTRAVENOUS
  Filled 2023-02-04: qty 100

## 2023-02-04 MED ORDER — HEPARIN SOD (PORK) LOCK FLUSH 100 UNIT/ML IV SOLN
500.0000 [IU] | Freq: Once | INTRAVENOUS | Status: AC | PRN
  Administered 2023-02-04: 500 [IU]

## 2023-02-04 MED ORDER — PALONOSETRON HCL INJECTION 0.25 MG/5ML
0.2500 mg | Freq: Once | INTRAVENOUS | Status: AC
  Administered 2023-02-04: 0.25 mg via INTRAVENOUS
  Filled 2023-02-04: qty 5

## 2023-02-04 NOTE — Research (Unsigned)
TRIAL S2205, ICE COMPRESS: RANDOMIZED TRIAL OF LIMB CRYOCOMPRESSION VERSUS CONTINUOUS COMPRESSION VERSUS LOW CYCLIC COMPRESSION FOR THE PREVENTION  OF TAXANE-INDUCED PERIPHERAL NEUROPATHY   Patient arrives today Accompanied by her friend Suzanne Tucker  for the Week 2 visit. Confirmed no lesions/wounds/sores to extremities.  1232 Wraps applied; pre-cooling started. 1245 Tolerable. 1304 Taxane started; moved to full treatment mode. 1305 Bathroom break. 1316 Restarted after bathroom. 1330 Tolerable. 1405 Taxane done. 1416 Make up time for bathroom break done; switched to post-cooling. 1446 Complete. Wraps removed. No sores, wounds, lesions to extremities.   PROs: No PROs due this visit.   LABS: No research labs this visit.  MD/PROVIDER VISIT: Patient sees Dr [name] for today's visit.   ADVERSE EVENTS: [name] reports AE as below. Attribution by Dr [name].  ADVERSE EVENT LOGClida Tucker [id number]  02/04/2023  Adverse Event Log  Study/Protocol: S2205, ICE COMPRESS: RANDOMIZED TRIAL OF LIMB CRYOCOMPRESSION VERSUS CONTINUOUS COMPRESSION VERSUS LOW CYCLIC COMPRESSION FOR THE PREVENTION  OF TAXANE-INDUCED PERIPHERAL NEUROPATHY   Cycle: Week 2  Event Grade Onset Date Resolved Date Drug Name Attribution Treatment Comments  Fever Grade 1 02/01/23 02/02/23  {ONC BCN CTCAE ATTRIBUTION:21196}    Anxiety Grade 1 01/30/23 02/04/23  {ONC BCN CTCAE ATTRIBUTION:21196}                                          NEURO ASSESSMENT: The neuro assessment not due this visit.  GIFT CARD: This study does not provide visit compensation.   DISPOSITION: Upon completion off all study requirements, patient remained in the infusion chair to complete her treatment/have her port de-accessed.   The patient was thanked for their time and continued voluntary participation in this study. [name] has been provided direct contact information and is encouraged to contact this Nurse for any  needs or questions.  Suzanne Chance Suzanne Vanloan, RN, BSN, [facility]  Her  Hers Clinical Research Nurse [facility] Direct Dial [phone]  Pager [phone] 02/07/2023 3:38 PM

## 2023-02-04 NOTE — Patient Instructions (Signed)
[facility]   Discharge Instructions: Thank you for choosing [facility] to provide your oncology and hematology care.   If you have a lab appointment with the Cancer Center, please go directly to the Cancer Center and check in at the registration area.   Wear comfortable clothing and clothing appropriate for easy access to any Portacath or PICC line.   We strive to give you quality time with your provider. You may need to reschedule your appointment if you arrive late (15 or more minutes).  Arriving late affects you and other patients whose appointments are after yours.  Also, if you miss three or more appointments without notifying the office, you may be dismissed from the clinic at the provider's discretion.      For prescription refill requests, have your pharmacy contact our office and allow 72 hours for refills to be completed.    Today you received the following chemotherapy and/or immunotherapy agents: Paclitaxel (Taxol) and Carboplatin       To help prevent nausea and vomiting after your treatment, we encourage you to take your nausea medication as directed.  BELOW ARE SYMPTOMS THAT SHOULD BE REPORTED IMMEDIATELY: *FEVER GREATER THAN 100.4 F (38 C) OR HIGHER *CHILLS OR SWEATING *NAUSEA AND VOMITING THAT IS NOT CONTROLLED WITH YOUR NAUSEA MEDICATION *UNUSUAL SHORTNESS OF BREATH *UNUSUAL BRUISING OR BLEEDING *URINARY PROBLEMS (pain or burning when urinating, or frequent urination) *BOWEL PROBLEMS (unusual diarrhea, constipation, pain near the anus) TENDERNESS IN MOUTH AND THROAT WITH OR WITHOUT PRESENCE OF ULCERS (sore throat, sores in mouth, or a toothache) UNUSUAL RASH, SWELLING OR PAIN  UNUSUAL VAGINAL DISCHARGE OR ITCHING   Items with * indicate a potential emergency and should be followed up as soon as possible or go to the Emergency Department if any problems should occur.  Please show the CHEMOTHERAPY ALERT CARD or  IMMUNOTHERAPY ALERT CARD at check-in to the Emergency Department and triage nurse.  Should you have questions after your visit or need to cancel or reschedule your appointment, please contact [facility]  Dept: [phone]  and follow the prompts.  Office hours are 8:00 a.m. to 4:30 p.m. Monday - Friday. Please note that voicemails left after 4:00 p.m. may not be returned until the following business day.  We are closed weekends and major holidays. You have access to a nurse at all times for urgent questions. Please call the main number to the clinic Dept: [phone] and follow the prompts.   For any non-urgent questions, you may also contact your provider using MyChart. We now offer e-Visits for anyone 18 and older to request care online for non-urgent symptoms. For details visit [email or web address].   Also download the MyChart app! Go to the app store, search "MyChart", open the app, select [facility], and log in with your MyChart username and password.   

## 2023-02-04 NOTE — Progress Notes (Signed)
[facility] Spiritual Care Note  Followed up with Suzanne Tucker, accompanied by a close friend, in infusion. She was in good spirits overall, reporting relief at learning that her diagnosis is stage III not IV. She notes that she is working to "stay humble" and grateful about this news, even as "it's beginning to sink in that I have cancer."  Provided pastoral presence, empathic listening, emotional support, and encouragement.  We plan to follow up at future treatments/by phone, and she knows to contact chaplain whenever needed/desired.   [address] [place], [place], [facility] Pager [phone] Voicemail [phone]

## 2023-02-04 NOTE — Assessment & Plan Note (Signed)
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%, HER2 1+  01/25/2023: PET/CT: Right breast cancer, right axilla, retropectoral and right cervical lymph nodes  Treatment plan: Neoadjuvant chemotherapy with Taxol carbo Keytruda weekly x 12 followed by Adriamycin Cytoxan Keytruda followed by Rande Lawman maintenance Breast conserving surgery with ALND Adjuvant radiation therapy ------------------------------------------------------------------------------------------------------------------------------------------- Current treatment: Cycle 2 Taxol carbo (with Keytruda every 3 weeks) Chemo toxicities:  Return to clinic weekly for chemotherapy and every other week for follow-up with me.

## 2023-02-04 NOTE — Progress Notes (Signed)
Patient Care Team: Georgina Quint, MD as PCP - General (Internal Medicine) Corky Crafts, MD as PCP - Cardiology (Cardiology) Serena Croissant, MD as Consulting Physician (Hematology and Oncology) Lonie Peak, MD as Attending Physician (Radiation Oncology) [name], MD as Consulting Physician (General Surgery) Donnelly Angelica, RN as Oncology Nurse Navigator Pershing Proud, RN as Oncology Nurse Navigator  DIAGNOSIS:  Encounter Diagnosis  Name Primary?   Malignant neoplasm of lower-inner quadrant of right breast of female, estrogen receptor negative (HCC) Yes    SUMMARY OF ONCOLOGIC HISTORY: Oncology History  Malignant neoplasm of lower-inner quadrant of right breast of female, estrogen receptor negative (HCC)  01/10/2023 Initial Diagnosis   Palpable lump right axilla x 2 weeks bulky axillary lymphadenopathy ultrasound breast: 2 masses at 4 o'clock position 1.1 cm and 0.6 cm multiple axillary lymph nodes largest 4.5 cm also level 2 and 3 (subpectoral lymph node as well): Biopsy: Grade 3 IDC ER 0%, PR 0%, Ki-67 95%, HER2 1+   01/28/2023 -  Chemotherapy   Patient is on Treatment Plan : BREAST Pembrolizumab (200) D1 + Carboplatin (1.5) D1,8,15 + Paclitaxel (80) D1,8,15 q21d X 4 cycles / Pembrolizumab (200) D1 + AC D1 q21d x 4 cycles       CHIEF COMPLIANT: Cycle 2 Taxol [place] (with Keytruda every 3 weeks)   INTERVAL HISTORY: Suzanne Tucker is a 42 y.o. female is here because of recent diagnosis of right breast cancer. Currently on Cycle 2 Taxol carbo. She reports everything went fine after treatment. She did have trouble with the benadryl. She did go to the ER because she was having a anxiety attack. She had some tightness in throat and the breathing. She says the past couple of days she has been feeling normal.  ALLERGIES:  is allergic to morphine; tessalon [benzonatate]; hydrocodone; latex; antihistamines, loratadine-type; codeine; and tramadol.  MEDICATIONS:   Current Outpatient Medications  Medication Sig Dispense Refill   acetaminophen (TYLENOL) 500 MG tablet Take 1,000 mg by mouth in the morning and at bedtime.     amoxicillin (AMOXIL) 500 MG tablet Take 1 tablet (500 mg total) by mouth 2 (two) times daily. 14 tablet 0   calcium carbonate (TUMS - DOSED IN MG ELEMENTAL CALCIUM) 500 MG chewable tablet Chew 1,000 mg by mouth as needed for indigestion or heartburn. (Patient not taking: Reported on 01/26/2023)     dexamethasone (DECADRON) 4 MG tablet Take 1 tablet (4 mg total) by mouth daily. Take with Adriamycin 1 tablet day after chemo and 1 tablet 2 days after chemo with food (Patient not taking: Reported on 01/25/2023) 8 tablet 1   fexofenadine (ALLEGRA) 180 MG tablet Take 180 mg by mouth as needed for allergies or rhinitis. (Patient not taking: Reported on 01/26/2023)     fluticasone (FLONASE) 50 MCG/ACT nasal spray Place 2 sprays into both nostrils daily. (Patient not taking: Reported on 01/26/2023) 16 g 0   lidocaine-prilocaine (EMLA) cream Apply to affected area once (Patient not taking: Reported on 01/25/2023) 30 g 3   ondansetron (ZOFRAN) 8 MG tablet Take 1 tablet (8 mg total) by mouth every 8 (eight) hours as needed for nausea or vomiting. Start on the third day after chemotherapy. (Patient not taking: Reported on 01/25/2023) 30 tablet 1   oxyCODONE (OXY IR/ROXICODONE) 5 MG immediate release tablet Take 0.5-1 tablets (2.5-5 mg total) by mouth every 6 (six) hours as needed for severe pain. 5 tablet 0   prochlorperazine (COMPAZINE) 10 MG tablet Take 1 tablet (  10 mg total) by mouth every 6 (six) hours as needed for nausea or vomiting. (Patient not taking: Reported on 01/25/2023) 30 tablet 1   No current facility-administered medications for this visit.   Facility-Administered Medications Ordered in Other Visits  Medication Dose Route Frequency Provider Last Rate Last Admin   CARBOplatin (PARAPLATIN) 190 mg in sodium chloride 0.9 % 100 mL chemo infusion   190 mg Intravenous Once Serena Croissant, MD       heparin lock flush 100 unit/mL  500 Units Intracatheter Once PRN Serena Croissant, MD       PACLitaxel (TAXOL) 138 mg in sodium chloride 0.9 % 250 mL chemo infusion (</= 80mg /m2)  80 mg/m2 (Treatment Plan Recorded) Intravenous Once Serena Croissant, MD       sodium chloride flush (NS) 0.9 % injection 10 mL  10 mL Intracatheter PRN Serena Croissant, MD        PHYSICAL EXAMINATION: ECOG PERFORMANCE STATUS: 1 - Symptomatic but completely ambulatory  Vitals:   02/04/23 1017  BP: 123/80  Pulse: 84  Resp: 18  Temp: 97.8 F (36.6 C)  SpO2: 100%   Filed Weights   02/04/23 1017  Weight: 144 lb 8 oz (65.5 kg)      LABORATORY DATA:  I have reviewed the data as listed    Latest Ref Rng & Units 02/04/2023   10:00 AM 02/01/2023    1:16 PM 01/30/2023    4:25 PM  CMP  Glucose 70 - 99 mg/dL 94  161  94   BUN 6 - 20 mg/dL 10  6  9    Creatinine 0.44 - 1.00 mg/dL 0.96  0.45  4.09   Sodium 135 - 145 mmol/L 138  136  135   Potassium 3.5 - 5.1 mmol/L 3.8  3.9  3.9   Chloride 98 - 111 mmol/L 104  104  102   CO2 22 - 32 mmol/L 28  27  24    Calcium 8.9 - 10.3 mg/dL 9.4  9.3  8.9   Total Protein 6.5 - 8.1 g/dL 6.9  6.7    Total Bilirubin 0.3 - 1.2 mg/dL 0.5  0.6    Alkaline Phos 38 - 126 U/L 41  41    AST 15 - 41 U/L 16  18    ALT 0 - 44 U/L 11  14      Lab Results  Component Value Date   WBC 3.6 (L) 02/04/2023   HGB 11.3 (L) 02/04/2023   HCT 32.5 (L) 02/04/2023   MCV 89.0 02/04/2023   PLT 230 02/04/2023   NEUTROABS 2.3 02/04/2023    ASSESSMENT & PLAN:  Malignant neoplasm of lower-inner quadrant of right breast of female, estrogen receptor negative (HCC) 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%, HER2 1+   01/25/2023: PET/CT: Right breast cancer, right axilla, retropectoral and right cervical lymph nodes  Treatment plan: Neoadjuvant chemotherapy with Taxol carbo Keytruda weekly x 12 followed by Adriamycin Cytoxan Keytruda followed by Rande Lawman maintenance Breast conserving surgery with ALND Adjuvant radiation therapy ------------------------------------------------------------------------------------------------------------------------------------------- Current treatment: Cycle 2 Taxol carbo (with Keytruda every 3 weeks) Chemo toxicities: Anxiety/panic attack: Resolved Mild fatigue Denies any nausea or vomiting  Return to clinic weekly for chemotherapy and every other week for follow-up  with me.    No orders of the defined types were placed in this encounter.  The patient has a good understanding of the overall plan. she agrees with it. she will call with any problems that may develop before the next visit here. Total time spent: 30 mins including face to face time and time spent for planning, charting and co-ordination of care   Tamsen Meek, MD 02/04/23    I Janan Ridge am acting as a Neurosurgeon for [employer]  I have reviewed the above documentation for accuracy and completeness, and I agree with the above.

## 2023-02-08 ENCOUNTER — Telehealth: Payer: Self-pay | Admitting: Hematology and Oncology

## 2023-02-08 ENCOUNTER — Encounter: Payer: Self-pay | Admitting: Genetic Counselor

## 2023-02-08 ENCOUNTER — Telehealth: Payer: Self-pay | Admitting: Genetic Counselor

## 2023-02-08 ENCOUNTER — Ambulatory Visit: Payer: Self-pay | Admitting: Genetic Counselor

## 2023-02-08 ENCOUNTER — Encounter: Payer: Self-pay | Admitting: Hematology and Oncology

## 2023-02-08 ENCOUNTER — Other Ambulatory Visit: Payer: Self-pay | Admitting: Hematology and Oncology

## 2023-02-08 DIAGNOSIS — Z1379 Encounter for other screening for genetic and chromosomal anomalies: Secondary | ICD-10-CM

## 2023-02-08 DIAGNOSIS — Z171 Estrogen receptor negative status [ER-]: Secondary | ICD-10-CM

## 2023-02-08 NOTE — Telephone Encounter (Signed)
Disclosed negative genetics.    

## 2023-02-08 NOTE — Telephone Encounter (Signed)
Scheduled appointments per WQ. Left voicemail. 

## 2023-02-08 NOTE — Progress Notes (Signed)
HPI:   Suzanne Tucker was previously seen in the [facility] due to a personal history of breast cancer and concerns regarding a hereditary predisposition to cancer.    Suzanne Tucker recent genetic test results were disclosed to her by telephone. These results and recommendations are discussed in more detail below.  CANCER HISTORY:  Oncology History  Malignant neoplasm of lower-inner quadrant of right breast of female, estrogen receptor negative (HCC)  01/10/2023 Initial Diagnosis   Palpable lump right axilla x 2 weeks bulky axillary lymphadenopathy ultrasound breast: 2 masses at 4 o'clock position 1.1 cm and 0.6 cm multiple axillary lymph nodes largest 4.5 cm also level 2 and 3 (subpectoral lymph node as well): Biopsy: Grade 3 IDC ER 0%, PR 0%, Ki-67 95%, HER2 1+   01/26/2023 Genetic Testing   Negative [facility] Custom Panel (37 genes).  Report date is 01/26/2023.    The [facility] Custom Cancers + RNA Panel includes sequencing, deletion/duplication, and RNA analysis of the following 37 genes: APC, ATM, AXIN2, BARD1, BMPR1A, BRCA1, BRCA2, BRIP1, CDH1, CDK4*, CDKN2A (p14ARF)*, CDKN2A (p16INK4a), CHEK2, CTNNA1, DICER1, EPCAM*, FH, GREM1*, HOXB13*, MBD4*, MLH1, MSH2, MSH3, MSH6, MUTYH, NF1, NTHL1, PALB2, PMS2, POLD1, POLE, PTEN, RAD51C, RAD51D, SMAD4, SMARCA4, STK11, TP53.  *Genes without RNA analysis.    01/28/2023 -  Chemotherapy   Patient is on Treatment Plan : BREAST Pembrolizumab (200) D1 + Carboplatin (1.5) D1,8,15 + Paclitaxel (80) D1,8,15 q21d X 4 cycles / Pembrolizumab (200) D1 + AC D1 q21d x 4 cycles       FAMILY HISTORY:  We obtained a detailed, 4-generation family history.  She did not report any known family history of cancer.         Suzanne Tucker is unaware of previous family history of genetic testing for hereditary cancer risks. There is no reported Ashkenazi Jewish ancestry. There is no known consanguinity.  GENETIC TEST RESULTS:  The [facility] Custom Cancer +RNA  Panel found no pathogenic mutations.    The [facility] Custom Cancers + RNA Panel includes sequencing, deletion/duplication, and RNA analysis of the following 37 genes: APC, ATM, AXIN2, BARD1, BMPR1A, BRCA1, BRCA2, BRIP1, CDH1, CDK4*, CDKN2A (p14ARF)*, CDKN2A (p16INK4a), CHEK2, CTNNA1, DICER1, EPCAM*, FH, GREM1*, HOXB13*, MBD4*, MLH1, MSH2, MSH3, MSH6, MUTYH, NF1, NTHL1, PALB2, PMS2, POLD1, POLE, PTEN, RAD51C, RAD51D, SMAD4, SMARCA4, STK11, TP53.  *Genes without RNA analysis.   The test report has been scanned into EPIC and is located under the Molecular Pathology section of the Results Review tab.  A portion of the result report is included below for reference. Genetic testing reported out on April .      Even though a pathogenic variant was not identified, possible explanations for the cancer in the family may include: There may be no hereditary risk for cancer in the family. The cancers in Suzanne Tucker and/or her family may be sporadic/familial or due to other genetic and environmental factors. There may be a gene mutation in one of these genes that current testing methods cannot detect but that chance is small. There could be another gene that has not yet been discovered, or that we have not yet tested, that is responsible for the cancer diagnoses in the family.    Therefore, it is important to remain in touch with cancer genetics in the future so that we can continue to offer Suzanne Tucker the most up to date genetic testing.    ADDITIONAL GENETIC TESTING:   Suzanne Tucker genetic testing was fairly extensive.  If  there are additional relevant genes identified to increase cancer risk that can be analyzed in the future, we would be happy to discuss and coordinate this testing at that time.      CANCER SCREENING RECOMMENDATIONS:  Suzanne Tucker test result is considered negative (normal).  This means that we have not identified a hereditary cause for her personal history of breast cancer at this  time.   An individual's cancer risk and medical management are not determined by genetic test results alone. Overall cancer risk assessment incorporates additional factors, including personal medical history, family history, and any available genetic information that may result in a personalized plan for cancer prevention and surveillance. Therefore, it is recommended she continue to follow the cancer management and screening guidelines provided by her oncology and primary healthcare provider.  RECOMMENDATIONS FOR FAMILY MEMBERS:   Since she did not inherit a identifiable mutation in a cancer predisposition gene included on this panel, her daughter could not have inherited a known mutation from her in one of these genes. Individuals in this family might be at some increased risk of developing cancer, over the general population risk, due to the family history of cancer.  Individuals in the family should notify their providers of the family history of cancer. We recommend women in this family have a yearly mammogram beginning at age 56, or 2 years younger than the earliest onset of cancer, an annual clinical breast exam, and perform monthly breast self-exams.  Risk models that take into account family history and hormonal history may be helpful in determining appropriate breast cancer screening options for family members.    FOLLOW-UP:  Cancer genetics is a rapidly advancing field and it is possible that new genetic tests will be appropriate for her and/or her family members in the future. We encourage Suzanne Tucker to remain in contact with cancer genetics, so we can update her personal and family histories and let her know of advances in cancer genetics that may benefit this family.   Our contact number was provided.. she knows she is welcome to call us at anytime with additional questions or concerns.   Willmer Fellers M. Rennie Plowman, MS, [facility] Genetic Counselor [email or web address] (P) [phone]

## 2023-02-09 ENCOUNTER — Other Ambulatory Visit: Payer: Self-pay

## 2023-02-09 ENCOUNTER — Encounter: Payer: Self-pay | Admitting: Physical Therapy

## 2023-02-10 ENCOUNTER — Encounter: Payer: Self-pay | Admitting: Emergency Medicine

## 2023-02-10 MED FILL — Dexamethasone Sodium Phosphate Inj 100 MG/10ML: INTRAMUSCULAR | Qty: 1 | Status: AC

## 2023-02-11 ENCOUNTER — Inpatient Hospital Stay: Payer: BC Managed Care – PPO

## 2023-02-11 ENCOUNTER — Other Ambulatory Visit: Payer: Self-pay

## 2023-02-11 ENCOUNTER — Encounter: Payer: Self-pay | Admitting: Radiology

## 2023-02-11 VITALS — BP 114/79 | HR 88 | Temp 98.5°F | Resp 12 | Wt 144.0 lb

## 2023-02-11 DIAGNOSIS — Z171 Estrogen receptor negative status [ER-]: Secondary | ICD-10-CM

## 2023-02-11 DIAGNOSIS — Z5111 Encounter for antineoplastic chemotherapy: Secondary | ICD-10-CM | POA: Diagnosis not present

## 2023-02-11 DIAGNOSIS — Z79899 Other long term (current) drug therapy: Secondary | ICD-10-CM | POA: Diagnosis not present

## 2023-02-11 DIAGNOSIS — C50311 Malignant neoplasm of lower-inner quadrant of right female breast: Secondary | ICD-10-CM | POA: Diagnosis not present

## 2023-02-11 DIAGNOSIS — Z95828 Presence of other vascular implants and grafts: Secondary | ICD-10-CM

## 2023-02-11 LAB — CBC WITH DIFFERENTIAL (CANCER CENTER ONLY)
Abs Immature Granulocytes: 0.01 10*3/uL (ref 0.00–0.07)
Basophils Absolute: 0 10*3/uL (ref 0.0–0.1)
Basophils Relative: 0 %
Eosinophils Absolute: 0.1 10*3/uL (ref 0.0–0.5)
Eosinophils Relative: 3 %
HCT: 30.8 % — ABNORMAL LOW (ref 36.0–46.0)
Hemoglobin: 10.5 g/dL — ABNORMAL LOW (ref 12.0–15.0)
Immature Granulocytes: 0 %
Lymphocytes Relative: 26 %
Lymphs Abs: 0.8 10*3/uL (ref 0.7–4.0)
MCH: 30.4 pg (ref 26.0–34.0)
MCHC: 34.1 g/dL (ref 30.0–36.0)
MCV: 89.3 fL (ref 80.0–100.0)
Monocytes Absolute: 0.2 10*3/uL (ref 0.1–1.0)
Monocytes Relative: 7 %
Neutro Abs: 1.9 10*3/uL (ref 1.7–7.7)
Neutrophils Relative %: 64 %
Platelet Count: 241 10*3/uL (ref 150–400)
RBC: 3.45 MIL/uL — ABNORMAL LOW (ref 3.87–5.11)
RDW: 12.3 % (ref 11.5–15.5)
WBC Count: 2.9 10*3/uL — ABNORMAL LOW (ref 4.0–10.5)
nRBC: 0 % (ref 0.0–0.2)

## 2023-02-11 LAB — CMP (CANCER CENTER ONLY)
ALT: 12 U/L (ref 0–44)
AST: 17 U/L (ref 15–41)
Albumin: 4.1 g/dL (ref 3.5–5.0)
Alkaline Phosphatase: 43 U/L (ref 38–126)
Anion gap: 5 (ref 5–15)
BUN: 13 mg/dL (ref 6–20)
CO2: 27 mmol/L (ref 22–32)
Calcium: 8.7 mg/dL — ABNORMAL LOW (ref 8.9–10.3)
Chloride: 105 mmol/L (ref 98–111)
Creatinine: 0.58 mg/dL (ref 0.44–1.00)
GFR, Estimated: >60 mL/min (ref >60)
Glucose, Bld: 103 mg/dL — ABNORMAL HIGH (ref 70–99)
Potassium: 4.2 mmol/L (ref 3.5–5.1)
Sodium: 137 mmol/L (ref 135–145)
Total Bilirubin: 0.4 mg/dL (ref 0.3–1.2)
Total Protein: 6.7 g/dL (ref 6.5–8.1)

## 2023-02-11 MED ORDER — SODIUM CHLORIDE 0.9% FLUSH
10.0000 mL | INTRAVENOUS | Status: DC | PRN
  Administered 2023-02-11: 10 mL

## 2023-02-11 MED ORDER — SODIUM CHLORIDE 0.9 % IV SOLN
10.0000 mg | Freq: Once | INTRAVENOUS | Status: AC
  Administered 2023-02-11: 10 mg via INTRAVENOUS
  Filled 2023-02-11: qty 10

## 2023-02-11 MED ORDER — FAMOTIDINE 20 MG IN NS 100 ML IVPB
20.0000 mg | Freq: Once | INTRAVENOUS | Status: AC
  Administered 2023-02-11: 20 mg via INTRAVENOUS
  Filled 2023-02-11: qty 100

## 2023-02-11 MED ORDER — PALONOSETRON HCL INJECTION 0.25 MG/5ML
0.2500 mg | Freq: Once | INTRAVENOUS | Status: AC
  Administered 2023-02-11: 0.25 mg via INTRAVENOUS
  Filled 2023-02-11: qty 5

## 2023-02-11 MED ORDER — SODIUM CHLORIDE 0.9% FLUSH
10.0000 mL | Freq: Once | INTRAVENOUS | Status: AC
  Administered 2023-02-11: 10 mL

## 2023-02-11 MED ORDER — SODIUM CHLORIDE 0.9 % IV SOLN
80.0000 mg/m2 | Freq: Once | INTRAVENOUS | Status: AC
  Administered 2023-02-11: 138 mg via INTRAVENOUS
  Filled 2023-02-11: qty 23

## 2023-02-11 MED ORDER — HEPARIN SOD (PORK) LOCK FLUSH 100 UNIT/ML IV SOLN
500.0000 [IU] | Freq: Once | INTRAVENOUS | Status: AC | PRN
  Administered 2023-02-11: 500 [IU]

## 2023-02-11 MED ORDER — SODIUM CHLORIDE 0.9 % IV SOLN
187.2000 mg | Freq: Once | INTRAVENOUS | Status: AC
  Administered 2023-02-11: 190 mg via INTRAVENOUS
  Filled 2023-02-11: qty 19

## 2023-02-11 MED ORDER — DIPHENHYDRAMINE HCL 50 MG/ML IJ SOLN
25.0000 mg | Freq: Once | INTRAMUSCULAR | Status: AC
  Administered 2023-02-11: 25 mg via INTRAVENOUS
  Filled 2023-02-11: qty 1

## 2023-02-11 MED ORDER — SODIUM CHLORIDE 0.9 % IV SOLN: Freq: Once | INTRAVENOUS | Status: AC

## 2023-02-11 NOTE — Patient Instructions (Signed)
[facility]  Discharge Instructions: Thank you for choosing [facility] to provide your oncology and hematology care.   If you have a lab appointment with the Cancer Center, please go directly to the Cancer Center and check in at the registration area.   Wear comfortable clothing and clothing appropriate for easy access to any Portacath or PICC line.   We strive to give you quality time with your provider. You may need to reschedule your appointment if you arrive late (15 or more minutes).  Arriving late affects you and other patients whose appointments are after yours.  Also, if you miss three or more appointments without notifying the office, you may be dismissed from the clinic at the provider's discretion.      For prescription refill requests, have your pharmacy contact our office and allow 72 hours for refills to be completed.    Today you received the following chemotherapy and/or immunotherapy agents - Taxol/Carboplatin      To help prevent nausea and vomiting after your treatment, we encourage you to take your nausea medication as directed.  BELOW ARE SYMPTOMS THAT SHOULD BE REPORTED IMMEDIATELY: *FEVER GREATER THAN 100.4 F (38 C) OR HIGHER *CHILLS OR SWEATING *NAUSEA AND VOMITING THAT IS NOT CONTROLLED WITH YOUR NAUSEA MEDICATION *UNUSUAL SHORTNESS OF BREATH *UNUSUAL BRUISING OR BLEEDING *URINARY PROBLEMS (pain or burning when urinating, or frequent urination) *BOWEL PROBLEMS (unusual diarrhea, constipation, pain near the anus) TENDERNESS IN MOUTH AND THROAT WITH OR WITHOUT PRESENCE OF ULCERS (sore throat, sores in mouth, or a toothache) UNUSUAL RASH, SWELLING OR PAIN  UNUSUAL VAGINAL DISCHARGE OR ITCHING   Items with * indicate a potential emergency and should be followed up as soon as possible or go to the Emergency Department if any problems should occur.  Please show the CHEMOTHERAPY ALERT CARD or IMMUNOTHERAPY ALERT CARD  at check-in to the Emergency Department and triage nurse.  Should you have questions after your visit or need to cancel or reschedule your appointment, please contact [facility]  Dept: [phone]  and follow the prompts.  Office hours are 8:00 a.m. to 4:30 p.m. Monday - Friday. Please note that voicemails left after 4:00 p.m. may not be returned until the following business day.  We are closed weekends and major holidays. You have access to a nurse at all times for urgent questions. Please call the main number to the clinic Dept: [phone] and follow the prompts.   For any non-urgent questions, you may also contact your provider using MyChart. We now offer e-Visits for anyone 18 and older to request care online for non-urgent symptoms. For details visit [email or web address].   Also download the MyChart app! Go to the app store, search "MyChart", open the app, select [place], and log in with your MyChart username and password.  

## 2023-02-11 NOTE — Research (Signed)
TRIAL S2205, ICE COMPRESS: RANDOMIZED TRIAL OF LIMB CRYOCOMPRESSION VERSUS CONTINUOUS COMPRESSION VERSUS LOW CYCLIC COMPRESSION FOR THE PREVENTION  OF TAXANE-INDUCED PERIPHERAL NEUROPATHY   Patient arrives today Unaccompanied for the Week 3 visit. Confirmed no lesions, wounds, or sores to extremities.  1408 Wraps applied; precooling started. 1420 Tolerable. 1439 Moved to treatment phase. 1441 Taxane started. 1455 Tolerable. 1539 Bathroom break. 1544 Restart after bathroom break; taxane complete. 1549 Completed 5 minute catch up for restroom break; switched to post-cooling. 1619 Treatment complete; wraps removed. No lesions, wounds or sores to extremities.   PROs: No PROs due this week.    LABS: No study labs due this week.   ADVERSE EVENTS: [name] reports no AEs.  ADVERSE EVENT LOGTauri Tucker [id number]  02/11/2023  Adverse Event Log  Study/Protocol: S2205, ICE COMPRESS: RANDOMIZED TRIAL OF LIMB CRYOCOMPRESSION VERSUS CONTINUOUS COMPRESSION VERSUS LOW CYCLIC COMPRESSION FOR THE PREVENTION  OF TAXANE-INDUCED PERIPHERAL NEUROPATHY Cycle: Week 3  Event Grade Onset Date Resolved Date Drug Name Attribution Treatment Comments                                                          NEURO ASSESSMENT: The neuro assessment was not due this week.  DISPOSITION: Upon completion off all study requirements, patient remained in infusion to have her port de-accessed.   The patient was thanked for their time and continued voluntary participation in this study. [name] has been provided direct contact information and is encouraged to contact this Nurse for any needs or questions.  Suzanne Chance Manie Bealer, RN, BSN, [facility] She  Her  Hers Clinical Research Nurse [facility] Direct Dial [phone]  Pager [phone] 02/11/2023 4:39 PM

## 2023-02-11 NOTE — Research (Signed)
DCP-001: Use of a Clinical Trial Screening Tool to Address Cancer Health Disparities in the [facility] Oncology Research Program ([facility])   02/11/23  CONSENT INTRO: [name] was identified by Dr. [name] as a potential candidate for the above listed study.  This Clinical Research Coordinator met with Ariellah Dooney, [id number], on 02/11/23 in a manner and location that ensures patient privacy to discuss participation in the above listed research study.  Patient is Unaccompanied.  A copy of the informed consent document and separate HIPAA Authorization was provided to the patient.  Patient reads, speaks, and understands [place].   Patient was provided with the business card of this Coordinator and encouraged to contact the research team with any questions.  Approximately 10 minutes were spent with the patient reviewing the informed consent documents.  Patient was provided the option of taking informed consent documents home to review and was encouraged to review at their convenience with their support network, including other care providers. Patient is comfortable with making a decision regarding study participation today.  Spoke with patient during infusion and at that time she was on the [place] Device for the S2205 trial. Patient is very interested in participation and this coordinator will meet with patient once her Taxol infusion is complete.    As outlined in the informed consent form, this Coordinator and Ariatna Ostermiller discussed the purpose of the research study, the investigational nature of the study, study procedures and requirements for study participation, potential risks and benefits of study participation, as well as alternatives to participation.  This study is not blinded or double-blinded. The patient understands participation is voluntary and they may withdraw from study participation at any time.  This study does not involve randomization.  This study does not involve  an investigational drug or device. This study does not involve a placebo. Patient understands enrollment is pending full eligibility review.   Confidentiality and how the patient's information will be used as part of study participation were discussed.  Patient was informed there is not reimbursement provided for their time and effort spent on trial participation.  The patient is encouraged to discuss research study participation with their insurance provider to determine what costs they may incur as part of study participation, including research related injury.    All questions were answered to patient's satisfaction.  The informed consent and separate HIPAA Authorization was reviewed page by page.  The patient's mental and emotional status is appropriate to provide informed consent, and the patient verbalizes an understanding of study participation.  Patient has agreed to participate in the above listed research study and has voluntarily signed the informed consent version dated 11/02/21 and separate HIPAA Authorization, version 15 dated 07/14/2021  on 02/11/23 at 424PM.  The patient was provided with a copy of the signed informed consent form and separate HIPAA Authorization for their reference.  No study specific procedures were obtained prior to the signing of the informed consent document.  Approximately 20 minutes were spent with the patient reviewing the informed consent documents.  Patient was not requested to complete a Release of Information form.  After completion of consent, patient completed the DCP-001 worksheet. Patient was thanked for her time and support of the above mentioned study.    Merri Brunette, RT(R)(T) Clinical Research Coordinator

## 2023-02-15 ENCOUNTER — Encounter: Payer: Self-pay | Admitting: Hematology and Oncology

## 2023-02-16 ENCOUNTER — Encounter: Payer: Self-pay | Admitting: Physician Assistant

## 2023-02-17 MED FILL — Dexamethasone Sodium Phosphate Inj 100 MG/10ML: INTRAMUSCULAR | Qty: 1 | Status: AC

## 2023-02-18 ENCOUNTER — Encounter: Payer: Self-pay | Admitting: General Practice

## 2023-02-18 ENCOUNTER — Inpatient Hospital Stay: Payer: BC Managed Care – PPO

## 2023-02-18 VITALS — BP 107/69 | HR 86 | Temp 98.3°F | Resp 12 | Wt 143.0 lb

## 2023-02-18 DIAGNOSIS — C50311 Malignant neoplasm of lower-inner quadrant of right female breast: Secondary | ICD-10-CM | POA: Diagnosis not present

## 2023-02-18 DIAGNOSIS — Z171 Estrogen receptor negative status [ER-]: Secondary | ICD-10-CM

## 2023-02-18 DIAGNOSIS — Z95828 Presence of other vascular implants and grafts: Secondary | ICD-10-CM

## 2023-02-18 DIAGNOSIS — Z79899 Other long term (current) drug therapy: Secondary | ICD-10-CM | POA: Diagnosis not present

## 2023-02-18 DIAGNOSIS — Z5111 Encounter for antineoplastic chemotherapy: Secondary | ICD-10-CM | POA: Diagnosis not present

## 2023-02-18 LAB — CMP (CANCER CENTER ONLY)
ALT: 11 U/L (ref 0–44)
AST: 15 U/L (ref 15–41)
Albumin: 4.3 g/dL (ref 3.5–5.0)
Alkaline Phosphatase: 45 U/L (ref 38–126)
Anion gap: 4 — ABNORMAL LOW (ref 5–15)
BUN: 10 mg/dL (ref 6–20)
CO2: 28 mmol/L (ref 22–32)
Calcium: 9 mg/dL (ref 8.9–10.3)
Chloride: 106 mmol/L (ref 98–111)
Creatinine: 0.6 mg/dL (ref 0.44–1.00)
GFR, Estimated: >60 mL/min (ref >60)
Glucose, Bld: 94 mg/dL (ref 70–99)
Potassium: 4 mmol/L (ref 3.5–5.1)
Sodium: 138 mmol/L (ref 135–145)
Total Bilirubin: 0.5 mg/dL (ref 0.3–1.2)
Total Protein: 6.8 g/dL (ref 6.5–8.1)

## 2023-02-18 LAB — CBC WITH DIFFERENTIAL (CANCER CENTER ONLY)
Abs Immature Granulocytes: 0 10*3/uL (ref 0.00–0.07)
Basophils Absolute: 0 10*3/uL (ref 0.0–0.1)
Basophils Relative: 0 %
Eosinophils Absolute: 0 10*3/uL (ref 0.0–0.5)
Eosinophils Relative: 1 %
HCT: 32 % — ABNORMAL LOW (ref 36.0–46.0)
Hemoglobin: 10.7 g/dL — ABNORMAL LOW (ref 12.0–15.0)
Immature Granulocytes: 0 %
Lymphocytes Relative: 26 %
Lymphs Abs: 0.8 10*3/uL (ref 0.7–4.0)
MCH: 30.1 pg (ref 26.0–34.0)
MCHC: 33.4 g/dL (ref 30.0–36.0)
MCV: 89.9 fL (ref 80.0–100.0)
Monocytes Absolute: 0.2 10*3/uL (ref 0.1–1.0)
Monocytes Relative: 8 %
Neutro Abs: 1.9 10*3/uL (ref 1.7–7.7)
Neutrophils Relative %: 65 %
Platelet Count: 238 10*3/uL (ref 150–400)
RBC: 3.56 MIL/uL — ABNORMAL LOW (ref 3.87–5.11)
RDW: 13 % (ref 11.5–15.5)
WBC Count: 3 10*3/uL — ABNORMAL LOW (ref 4.0–10.5)
nRBC: 0 % (ref 0.0–0.2)

## 2023-02-18 MED ORDER — PALONOSETRON HCL INJECTION 0.25 MG/5ML
0.2500 mg | Freq: Once | INTRAVENOUS | Status: AC
  Administered 2023-02-18: 0.25 mg via INTRAVENOUS
  Filled 2023-02-18: qty 5

## 2023-02-18 MED ORDER — SODIUM CHLORIDE 0.9% FLUSH
10.0000 mL | Freq: Once | INTRAVENOUS | Status: AC
  Administered 2023-02-18: 10 mL

## 2023-02-18 MED ORDER — SODIUM CHLORIDE 0.9% FLUSH
10.0000 mL | INTRAVENOUS | Status: DC | PRN
  Administered 2023-02-18: 10 mL

## 2023-02-18 MED ORDER — SODIUM CHLORIDE 0.9 % IV SOLN
80.0000 mg/m2 | Freq: Once | INTRAVENOUS | Status: AC
  Administered 2023-02-18: 138 mg via INTRAVENOUS
  Filled 2023-02-18: qty 23

## 2023-02-18 MED ORDER — DIPHENHYDRAMINE HCL 50 MG/ML IJ SOLN
25.0000 mg | Freq: Once | INTRAMUSCULAR | Status: AC
  Administered 2023-02-18: 25 mg via INTRAVENOUS
  Filled 2023-02-18: qty 1

## 2023-02-18 MED ORDER — HEPARIN SOD (PORK) LOCK FLUSH 100 UNIT/ML IV SOLN
500.0000 [IU] | Freq: Once | INTRAVENOUS | Status: AC | PRN
  Administered 2023-02-18: 500 [IU]

## 2023-02-18 MED ORDER — SODIUM CHLORIDE 0.9 % IV SOLN: Freq: Once | INTRAVENOUS | Status: AC

## 2023-02-18 MED ORDER — FAMOTIDINE 20 MG IN NS 100 ML IVPB
20.0000 mg | Freq: Once | INTRAVENOUS | Status: AC
  Administered 2023-02-18: 20 mg via INTRAVENOUS
  Filled 2023-02-18: qty 100

## 2023-02-18 MED ORDER — SODIUM CHLORIDE 0.9 % IV SOLN
200.0000 mg | Freq: Once | INTRAVENOUS | Status: AC
  Administered 2023-02-18: 200 mg via INTRAVENOUS
  Filled 2023-02-18: qty 200

## 2023-02-18 MED ORDER — SODIUM CHLORIDE 0.9 % IV SOLN
187.2000 mg | Freq: Once | INTRAVENOUS | Status: AC
  Administered 2023-02-18: 190 mg via INTRAVENOUS
  Filled 2023-02-18: qty 19

## 2023-02-18 MED ORDER — SODIUM CHLORIDE 0.9 % IV SOLN
10.0000 mg | Freq: Once | INTRAVENOUS | Status: AC
  Administered 2023-02-18: 10 mg via INTRAVENOUS
  Filled 2023-02-18: qty 10

## 2023-02-18 NOTE — Research (Signed)
TRIAL S2205, ICE COMPRESS: RANDOMIZED TRIAL OF LIMB CRYOCOMPRESSION VERSUS CONTINUOUS COMPRESSION VERSUS LOW CYCLIC COMPRESSION FOR THE PREVENTION  OF TAXANE-INDUCED PERIPHERAL NEUROPATHY   Patient arrives today Accompanied by her husband  for the Week 4 visit. Confirmed no wounds, lesions or sores to extremities.  1140 Wraps applied; pre-cooling initiated. 1150 Tolerable. 1223 Taxane started; moved to cooling phase. 1228 Bathroom break; tolerable. 1236 Restarted after bathroom. 1325 Taxane complete; needs 8 extra minutes to make up for bathroom break. 1333 Moved to post-cooling. 1403 Post-cooling/treatment done. No wounds, lesions or sores to extremities.   PROs: Per study protocol, no PROs are due today.     ADVERSE EVENTS: [name] reports no AEs today.  ADVERSE EVENT LOGSivana Tucker [id number]  02/18/2023  Adverse Event Log  Study/Protocol: S2205, ICE COMPRESS: RANDOMIZED TRIAL OF LIMB CRYOCOMPRESSION VERSUS CONTINUOUS COMPRESSION VERSUS LOW CYCLIC COMPRESSION FOR THE PREVENTION  OF TAXANE-INDUCED PERIPHERAL NEUROPATHY Cycle: Week 4 treatment  Event Grade Onset Date Resolved Date Drug Name Attribution Treatment Comments                                                          DISPOSITION: Upon completion off all study requirements, patient walked out with her husband.   The patient was thanked for their time and continued voluntary participation in this study. [name] has been provided direct contact information and is encouraged to contact this Nurse for any needs or questions.  Suzanne Chance Mayleigh Tetrault, RN, BSN, [facility] She  Her  Hers Clinical Research Nurse [facility] Direct Dial [phone]  Pager [phone] 02/18/2023 3:24 PM

## 2023-02-18 NOTE — Progress Notes (Signed)
[facility] Spiritual Care Note  Followed up with Suzanne Tucker and her husband Suzanne Tucker, who was able to take the day off, in infusion. [name] was in good spirits, citing minimal side effects, improved labs, and overall adjustment to diagnosis--thanks to perspective, gratitude, and good support.  Plan to follow up at a future treatment, and couple knows to reach out whenever needed/desired.   [address] Rush Barer, [place], [facility] Pager [phone] Voicemail [phone]

## 2023-02-18 NOTE — Patient Instructions (Signed)
[facility]  Discharge Instructions: Thank you for choosing [facility] to provide your oncology and hematology care.   If you have a lab appointment with the Cancer Center, please go directly to the Cancer Center and check in at the registration area.   Wear comfortable clothing and clothing appropriate for easy access to any Portacath or PICC line.   We strive to give you quality time with your provider. You may need to reschedule your appointment if you arrive late (15 or more minutes).  Arriving late affects you and other patients whose appointments are after yours.  Also, if you miss three or more appointments without notifying the office, you may be dismissed from the clinic at the provider's discretion.      For prescription refill requests, have your pharmacy contact our office and allow 72 hours for refills to be completed.    Today you received the following chemotherapy and/or immunotherapy agents: Keytruda/Taxol/Carboplatin      To help prevent nausea and vomiting after your treatment, we encourage you to take your nausea medication as directed.  BELOW ARE SYMPTOMS THAT SHOULD BE REPORTED IMMEDIATELY: *FEVER GREATER THAN 100.4 F (38 C) OR HIGHER *CHILLS OR SWEATING *NAUSEA AND VOMITING THAT IS NOT CONTROLLED WITH YOUR NAUSEA MEDICATION *UNUSUAL SHORTNESS OF BREATH *UNUSUAL BRUISING OR BLEEDING *URINARY PROBLEMS (pain or burning when urinating, or frequent urination) *BOWEL PROBLEMS (unusual diarrhea, constipation, pain near the anus) TENDERNESS IN MOUTH AND THROAT WITH OR WITHOUT PRESENCE OF ULCERS (sore throat, sores in mouth, or a toothache) UNUSUAL RASH, SWELLING OR PAIN  UNUSUAL VAGINAL DISCHARGE OR ITCHING   Items with * indicate a potential emergency and should be followed up as soon as possible or go to the Emergency Department if any problems should occur.  Please show the CHEMOTHERAPY ALERT CARD or IMMUNOTHERAPY  ALERT CARD at check-in to the Emergency Department and triage nurse.  Should you have questions after your visit or need to cancel or reschedule your appointment, please contact [facility]  Dept: [phone]  and follow the prompts.  Office hours are 8:00 a.m. to 4:30 p.m. Monday - Friday. Please note that voicemails left after 4:00 p.m. may not be returned until the following business day.  We are closed weekends and major holidays. You have access to a nurse at all times for urgent questions. Please call the main number to the clinic Dept: [phone] and follow the prompts.   For any non-urgent questions, you may also contact your provider using MyChart. We now offer e-Visits for anyone 18 and older to request care online for non-urgent symptoms. For details visit [email or web address].   Also download the MyChart app! Go to the app store, search "MyChart", open the app, select [facility], and log in with your MyChart username and password.   

## 2023-02-21 ENCOUNTER — Encounter: Payer: Self-pay | Admitting: Physician Assistant

## 2023-02-22 NOTE — Progress Notes (Signed)
Patient Care Team: Georgina Quint, MD as PCP - General (Internal Medicine) Corky Crafts, MD as PCP - Cardiology (Cardiology) Serena Croissant, MD as Consulting Physician (Hematology and Oncology) Lonie Peak, MD as Attending Physician (Radiation Oncology) [name], MD as Consulting Physician (General Surgery) Donnelly Angelica, RN as Oncology Nurse Navigator Pershing Proud, RN as Oncology Nurse Navigator  DIAGNOSIS:  Encounter Diagnosis  Name Primary?   Malignant neoplasm of lower-inner quadrant of right breast of female, estrogen receptor negative (HCC) Yes    SUMMARY OF ONCOLOGIC HISTORY: Oncology History  Malignant neoplasm of lower-inner quadrant of right breast of female, estrogen receptor negative (HCC)  01/10/2023 Initial Diagnosis   Palpable lump right axilla x 2 weeks bulky axillary lymphadenopathy ultrasound breast: 2 masses at 4 o'clock position 1.1 cm and 0.6 cm multiple axillary lymph nodes largest 4.5 cm also level 2 and 3 (subpectoral lymph node as well): Biopsy: Grade 3 IDC ER 0%, PR 0%, Ki-67 95%, HER2 1+   01/26/2023 Genetic Testing   Negative [employer]ae Custom Panel (37 genes).  Report date is 01/26/2023.    The [employer]ae Custom Cancers + RNA Panel includes sequencing, deletion/duplication, and RNA analysis of the following 37 genes: APC, ATM, AXIN2, BARD1, BMPR1A, BRCA1, BRCA2, BRIP1, CDH1, CDK4*, CDKN2A (p14ARF)*, CDKN2A (p16INK4a), CHEK2, CTNNA1, DICER1, EPCAM*, FH, GREM1*, HOXB13*, MBD4*, MLH1, MSH2, MSH3, MSH6, MUTYH, NF1, NTHL1, PALB2, PMS2, POLD1, POLE, PTEN, RAD51C, RAD51D, SMAD4, SMARCA4, STK11, TP53.  *Genes without RNA analysis.    01/28/2023 -  Chemotherapy   Patient is on Treatment Plan : BREAST Pembrolizumab (200) D1 + Carboplatin (1.5) D1,8,15 + Paclitaxel (80) D1,8,15 q21d X 4 cycles / Pembrolizumab (200) D1 + AC D1 q21d x 4 cycles       CHIEF COMPLIANT: Cycle 5 Taxol carbo (with Keytruda every 3 weeks)   INTERVAL HISTORY: Suzanne Tucker is a 42 y.o. female is here because of recent diagnosis of right breast cancer. Currently on Cycle 5 Taxol carbo. She presents to the clinic for a follow-up. She reports that the [place] was rough. She denies nausea and diarrhea. No numbness tingling in fingers and toes. Taste and appetite is good. No cravings, but eating more than usual. She does has some fatigue after treatment. She's says the chest wall was feel puffy. Anxiety has subsided. She has got it under control. She is doing a lot of walking.     ALLERGIES:  is allergic to morphine; tessalon [benzonatate]; hydrocodone; latex; antihistamines, loratadine-type; codeine; and tramadol.  MEDICATIONS:  Current Outpatient Medications  Medication Sig Dispense Refill   acetaminophen (TYLENOL) 500 MG tablet Take 1,000 mg by mouth in the morning and at bedtime.     amoxicillin (AMOXIL) 500 MG tablet Take 1 tablet (500 mg total) by mouth 2 (two) times daily. 14 tablet 0   calcium carbonate (TUMS - DOSED IN MG ELEMENTAL CALCIUM) 500 MG chewable tablet Chew 1,000 mg by mouth as needed for indigestion or heartburn. (Patient not taking: Reported on 01/26/2023)     dexamethasone (DECADRON) 4 MG tablet Take 1 tablet (4 mg total) by mouth daily. Take with Adriamycin 1 tablet day after chemo and 1 tablet 2 days after chemo with food (Patient not taking: Reported on 01/25/2023) 8 tablet 1   fexofenadine (ALLEGRA) 180 MG tablet Take 180 mg by mouth as needed for allergies or rhinitis. (Patient not taking: Reported on 01/26/2023)     fluticasone (FLONASE) 50 MCG/ACT nasal spray Place 2 sprays into both nostrils  daily. (Patient not taking: Reported on 01/26/2023) 16 g 0   lidocaine-prilocaine (EMLA) cream Apply to affected area once (Patient not taking: Reported on 01/25/2023) 30 g 3   ondansetron (ZOFRAN) 8 MG tablet Take 1 tablet (8 mg total) by mouth every 8 (eight) hours as needed for nausea or vomiting. Start on the third day after chemotherapy. (Patient  not taking: Reported on 01/25/2023) 30 tablet 1   prochlorperazine (COMPAZINE) 10 MG tablet Take 1 tablet (10 mg total) by mouth every 6 (six) hours as needed for nausea or vomiting. (Patient not taking: Reported on 01/25/2023) 30 tablet 1   No current facility-administered medications for this visit.    PHYSICAL EXAMINATION: ECOG PERFORMANCE STATUS: 1 - Symptomatic but completely ambulatory  Vitals:   02/25/23 1118  BP: 123/78  Pulse: 87  Resp: 18  Temp: 97.7 F (36.5 C)  SpO2: 100%   Filed Weights   02/25/23 1118  Weight: 144 lb 5 oz (65.5 kg)      LABORATORY DATA:  I have reviewed the data as listed    Latest Ref Rng & Units 02/25/2023   10:58 AM 02/18/2023    9:45 AM 02/11/2023   11:39 AM  CMP  Glucose 70 - 99 mg/dL 92  94  161   BUN 6 - 20 mg/dL 15  10  13    Creatinine 0.44 - 1.00 mg/dL 0.96  0.45  4.09   Sodium 135 - 145 mmol/L 137  138  137   Potassium 3.5 - 5.1 mmol/L 4.1  4.0  4.2   Chloride 98 - 111 mmol/L 105  106  105   CO2 22 - 32 mmol/L 27  28  27    Calcium 8.9 - 10.3 mg/dL 8.8  9.0  8.7   Total Protein 6.5 - 8.1 g/dL 6.6  6.8  6.7   Total Bilirubin 0.3 - 1.2 mg/dL 0.6  0.5  0.4   Alkaline Phos 38 - 126 U/L 42  45  43   AST 15 - 41 U/L 16  15  17    ALT 0 - 44 U/L 11  11  12      Lab Results  Component Value Date   WBC 3.1 (L) 02/25/2023   HGB 10.7 (L) 02/25/2023   HCT 31.0 (L) 02/25/2023   MCV 89.1 02/25/2023   PLT 171 02/25/2023   NEUTROABS 2.1 02/25/2023    ASSESSMENT & PLAN:  Malignant neoplasm of lower-inner quadrant of right breast of female, estrogen receptor negative (HCC) 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%, HER2 1+  01/25/2023: PET/CT: Right breast cancer, right axilla, retropectoral and right  cervical lymph nodes   Treatment plan: Neoadjuvant chemotherapy with Taxol carbo Keytruda weekly x 12 followed by Adriamycin Cytoxan Keytruda followed by Rande Lawman maintenance Breast conserving surgery with ALND Adjuvant radiation therapy ------------------------------------------------------------------------------------------------------------------------------------------- Current treatment: Cycle 5 Taxol carbo (with Keytruda every 3 weeks) Chemo toxicities: Anxiety/panic attack: Resolved Mild fatigue Leukopenia: Monitoring ANC closely. Denies any nausea or vomiting   Return to clinic weekly for chemotherapy and every other week for follow-up with me.    No orders of the defined types were placed in this encounter.  The patient has a good understanding of the overall plan. she agrees with  it. she will call with any problems that may develop before the next visit here. Total time spent: 30 mins including face to face time and time spent for planning, charting and co-ordination of care   Tamsen Meek, MD 02/25/23    I Janan Ridge am acting as a Neurosurgeon for [employer]  I have reviewed the above documentation for accuracy and completeness, and I agree with the above.

## 2023-02-24 MED FILL — Dexamethasone Sodium Phosphate Inj 100 MG/10ML: INTRAMUSCULAR | Qty: 1 | Status: AC

## 2023-02-25 ENCOUNTER — Inpatient Hospital Stay: Payer: BC Managed Care – PPO

## 2023-02-25 ENCOUNTER — Other Ambulatory Visit: Payer: Self-pay

## 2023-02-25 ENCOUNTER — Encounter: Payer: Self-pay | Admitting: General Practice

## 2023-02-25 ENCOUNTER — Inpatient Hospital Stay (HOSPITAL_BASED_OUTPATIENT_CLINIC_OR_DEPARTMENT_OTHER): Payer: BC Managed Care – PPO | Admitting: Hematology and Oncology

## 2023-02-25 VITALS — BP 123/78 | HR 87 | Temp 97.7°F | Resp 18 | Ht 61.0 in | Wt 144.3 lb

## 2023-02-25 DIAGNOSIS — Z171 Estrogen receptor negative status [ER-]: Secondary | ICD-10-CM

## 2023-02-25 DIAGNOSIS — C50311 Malignant neoplasm of lower-inner quadrant of right female breast: Secondary | ICD-10-CM | POA: Diagnosis not present

## 2023-02-25 DIAGNOSIS — Z79899 Other long term (current) drug therapy: Secondary | ICD-10-CM | POA: Diagnosis not present

## 2023-02-25 DIAGNOSIS — Z95828 Presence of other vascular implants and grafts: Secondary | ICD-10-CM

## 2023-02-25 DIAGNOSIS — Z5111 Encounter for antineoplastic chemotherapy: Secondary | ICD-10-CM | POA: Diagnosis not present

## 2023-02-25 LAB — CMP (CANCER CENTER ONLY)
ALT: 11 U/L (ref 0–44)
AST: 16 U/L (ref 15–41)
Albumin: 4.2 g/dL (ref 3.5–5.0)
Alkaline Phosphatase: 42 U/L (ref 38–126)
Anion gap: 5 (ref 5–15)
BUN: 15 mg/dL (ref 6–20)
CO2: 27 mmol/L (ref 22–32)
Calcium: 8.8 mg/dL — ABNORMAL LOW (ref 8.9–10.3)
Chloride: 105 mmol/L (ref 98–111)
Creatinine: 0.57 mg/dL (ref 0.44–1.00)
GFR, Estimated: >60 mL/min (ref >60)
Glucose, Bld: 92 mg/dL (ref 70–99)
Potassium: 4.1 mmol/L (ref 3.5–5.1)
Sodium: 137 mmol/L (ref 135–145)
Total Bilirubin: 0.6 mg/dL (ref 0.3–1.2)
Total Protein: 6.6 g/dL (ref 6.5–8.1)

## 2023-02-25 LAB — CBC WITH DIFFERENTIAL (CANCER CENTER ONLY)
Abs Immature Granulocytes: 0.01 10*3/uL (ref 0.00–0.07)
Basophils Absolute: 0 10*3/uL (ref 0.0–0.1)
Basophils Relative: 0 %
Eosinophils Absolute: 0 10*3/uL (ref 0.0–0.5)
Eosinophils Relative: 1 %
HCT: 31 % — ABNORMAL LOW (ref 36.0–46.0)
Hemoglobin: 10.7 g/dL — ABNORMAL LOW (ref 12.0–15.0)
Immature Granulocytes: 0 %
Lymphocytes Relative: 25 %
Lymphs Abs: 0.8 10*3/uL (ref 0.7–4.0)
MCH: 30.7 pg (ref 26.0–34.0)
MCHC: 34.5 g/dL (ref 30.0–36.0)
MCV: 89.1 fL (ref 80.0–100.0)
Monocytes Absolute: 0.2 10*3/uL (ref 0.1–1.0)
Monocytes Relative: 7 %
Neutro Abs: 2.1 10*3/uL (ref 1.7–7.7)
Neutrophils Relative %: 67 %
Platelet Count: 171 10*3/uL (ref 150–400)
RBC: 3.48 MIL/uL — ABNORMAL LOW (ref 3.87–5.11)
RDW: 13.3 % (ref 11.5–15.5)
WBC Count: 3.1 10*3/uL — ABNORMAL LOW (ref 4.0–10.5)
nRBC: 0 % (ref 0.0–0.2)

## 2023-02-25 MED ORDER — FAMOTIDINE IN NACL 20-0.9 MG/50ML-% IV SOLN
20.0000 mg | Freq: Once | INTRAVENOUS | Status: AC
  Administered 2023-02-25: 20 mg via INTRAVENOUS
  Filled 2023-02-25: qty 50

## 2023-02-25 MED ORDER — SODIUM CHLORIDE 0.9 % IV SOLN
10.0000 mg | Freq: Once | INTRAVENOUS | Status: AC
  Administered 2023-02-25: 10 mg via INTRAVENOUS
  Filled 2023-02-25: qty 10

## 2023-02-25 MED ORDER — SODIUM CHLORIDE 0.9% FLUSH
10.0000 mL | Freq: Once | INTRAVENOUS | Status: AC
  Administered 2023-02-25: 10 mL

## 2023-02-25 MED ORDER — SODIUM CHLORIDE 0.9 % IV SOLN
80.0000 mg/m2 | Freq: Once | INTRAVENOUS | Status: AC
  Administered 2023-02-25: 138 mg via INTRAVENOUS
  Filled 2023-02-25: qty 23

## 2023-02-25 MED ORDER — SODIUM CHLORIDE 0.9 % IV SOLN
187.2000 mg | Freq: Once | INTRAVENOUS | Status: AC
  Administered 2023-02-25: 190 mg via INTRAVENOUS
  Filled 2023-02-25: qty 19

## 2023-02-25 MED ORDER — SODIUM CHLORIDE 0.9% FLUSH
10.0000 mL | INTRAVENOUS | Status: DC | PRN
  Administered 2023-02-25: 10 mL

## 2023-02-25 MED ORDER — PALONOSETRON HCL INJECTION 0.25 MG/5ML
0.2500 mg | Freq: Once | INTRAVENOUS | Status: AC
  Administered 2023-02-25: 0.25 mg via INTRAVENOUS
  Filled 2023-02-25: qty 5

## 2023-02-25 MED ORDER — SODIUM CHLORIDE 0.9 % IV SOLN: Freq: Once | INTRAVENOUS | Status: AC

## 2023-02-25 MED ORDER — HEPARIN SOD (PORK) LOCK FLUSH 100 UNIT/ML IV SOLN
500.0000 [IU] | Freq: Once | INTRAVENOUS | Status: AC | PRN
  Administered 2023-02-25: 500 [IU]

## 2023-02-25 MED ORDER — DIPHENHYDRAMINE HCL 50 MG/ML IJ SOLN
25.0000 mg | Freq: Once | INTRAMUSCULAR | Status: AC
  Administered 2023-02-25: 25 mg via INTRAVENOUS
  Filled 2023-02-25: qty 1

## 2023-02-25 NOTE — Assessment & Plan Note (Addendum)
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%, HER2 1+  01/25/2023: PET/CT: Right breast cancer, right axilla, retropectoral and right cervical lymph nodes   Treatment plan: Neoadjuvant chemotherapy with Taxol carbo Keytruda weekly x 12 followed by Adriamycin Cytoxan Keytruda followed by Rande Lawman maintenance Breast conserving surgery with ALND Adjuvant radiation therapy ------------------------------------------------------------------------------------------------------------------------------------------- Current treatment: Cycle 5 Taxol carbo (with Keytruda every 3 weeks) Chemo toxicities: Anxiety/panic attack: Resolved Mild fatigue Leukopenia: Monitoring ANC closely. Denies any nausea or vomiting   Return to clinic weekly for chemotherapy and every other week for follow-up with me.

## 2023-02-25 NOTE — Progress Notes (Signed)
[facility] Spiritual Care Note  Followed up with Suzanne Tucker in infusion. We talked about the natural ups and downs of life, especially during treatment, recognizing that it's human to have down days, but there's no need to stay there (in the hard place or low feelings) any longer than necessary. She was upbeat at the time of this encounter, reporting that perspective, gratitude, and support of friends/family/students are all helping her cope.  Provided empathic listening, normalization of feelings and phases, and emotional support.  Plan to follow up at a future treatment.   [address] [place], [place], [facility] Pager [phone] Voicemail [phone]

## 2023-02-25 NOTE — Patient Instructions (Signed)
[facility]  Discharge Instructions: Thank you for choosing [facility] to provide your oncology and hematology care.   If you have a lab appointment with the Cancer Center, please go directly to the Cancer Center and check in at the registration area.   Wear comfortable clothing and clothing appropriate for easy access to any Portacath or PICC line.   We strive to give you quality time with your provider. You may need to reschedule your appointment if you arrive late (15 or more minutes).  Arriving late affects you and other patients whose appointments are after yours.  Also, if you miss three or more appointments without notifying the office, you may be dismissed from the clinic at the provider's discretion.      For prescription refill requests, have your pharmacy contact our office and allow 72 hours for refills to be completed.    Today you received the following chemotherapy and/or immunotherapy agents - Taxol/Carboplatin      To help prevent nausea and vomiting after your treatment, we encourage you to take your nausea medication as directed.  BELOW ARE SYMPTOMS THAT SHOULD BE REPORTED IMMEDIATELY: *FEVER GREATER THAN 100.4 F (38 C) OR HIGHER *CHILLS OR SWEATING *NAUSEA AND VOMITING THAT IS NOT CONTROLLED WITH YOUR NAUSEA MEDICATION *UNUSUAL SHORTNESS OF BREATH *UNUSUAL BRUISING OR BLEEDING *URINARY PROBLEMS (pain or burning when urinating, or frequent urination) *BOWEL PROBLEMS (unusual diarrhea, constipation, pain near the anus) TENDERNESS IN MOUTH AND THROAT WITH OR WITHOUT PRESENCE OF ULCERS (sore throat, sores in mouth, or a toothache) UNUSUAL RASH, SWELLING OR PAIN  UNUSUAL VAGINAL DISCHARGE OR ITCHING   Items with * indicate a potential emergency and should be followed up as soon as possible or go to the Emergency Department if any problems should occur.  Please show the CHEMOTHERAPY ALERT CARD or IMMUNOTHERAPY ALERT CARD  at check-in to the Emergency Department and triage nurse.  Should you have questions after your visit or need to cancel or reschedule your appointment, please contact [facility]  Dept: [phone]  and follow the prompts.  Office hours are 8:00 a.m. to 4:30 p.m. Monday - Friday. Please note that voicemails left after 4:00 p.m. may not be returned until the following business day.  We are closed weekends and major holidays. You have access to a nurse at all times for urgent questions. Please call the main number to the clinic Dept: [phone] and follow the prompts.   For any non-urgent questions, you may also contact your provider using MyChart. We now offer e-Visits for anyone 18 and older to request care online for non-urgent symptoms. For details visit [email or web address].   Also download the MyChart app! Go to the app store, search "MyChart", open the app, select [facility], and log in with your MyChart username and password.  

## 2023-02-25 NOTE — Research (Signed)
TRIAL S2205, ICE COMPRESS: RANDOMIZED TRIAL OF LIMB CRYOCOMPRESSION VERSUS CONTINUOUS COMPRESSION VERSUS LOW CYCLIC COMPRESSION FOR THE PREVENTION  OF TAXANE-INDUCED PERIPHERAL NEUROPATHY   Patient arrives today Accompanied by her friend [name]  for the Week 5 visit. Confirmed no wounds, sores, or lesions to extremities.  1313 Wraps applied; pre-cooling initiated. 1330 Tolerable. 1354 Taxane started; transitioned to treatment phase. 1355 Bathroom break. 1402 Restart after bathroom break. 1415 Tolerable. 1454 Taxane done; needs 7 more minutes to make up for bathroom break. 1501 Moved to post-cooling. 1531 Treatment done. No wounds, sores, lesions to extremeties.    MD/PROVIDER VISIT: Patient sees Dr [name] for today's visit.   ADVERSE EVENTS: [name] reports no AEs.  ADVERSE EVENT LOGSammantha Tucker [phone]  02/25/2023  Adverse Event Log  Study/Protocol: S2205 Cycle: Week 5  Event Grade Onset Date Resolved Date Drug Name Attribution Treatment Comments                                                         NEURO ASSESSMENT: The neuro assessment not due this week.  GIFT CARD: This study does not provide visit compensation.   DISPOSITION: Upon completion off all study requirements, patient remained in infusion to complete treatment..   The patient was thanked for their time and continued voluntary participation in this study. [name] has been provided direct contact information and is encouraged to contact this Nurse for any needs or questions.  Suzanne Chance Bary Limbach, Suzanne Tucker, Suzanne Tucker, Suzanne Tucker She  Her  [facility] Clinical Research Nurse [facility] Direct Dial [phone]  Pager [phone] 02/25/2023 4:08 PM

## 2023-02-28 NOTE — Progress Notes (Signed)
Patient Care Team: Georgina Quint, MD as PCP - General (Internal Medicine) Corky Crafts, MD as PCP - Cardiology (Cardiology) Serena Croissant, MD as Consulting Physician (Hematology and Oncology) Lonie Peak, MD as Attending Physician (Radiation Oncology) [name], MD as Consulting Physician (General Surgery) Donnelly Angelica, RN as Oncology Nurse Navigator Pershing Proud, RN as Oncology Nurse Navigator  DIAGNOSIS:  Encounter Diagnosis  Name Primary?   Malignant neoplasm of lower-inner quadrant of right breast of female, estrogen receptor negative (HCC)     SUMMARY OF ONCOLOGIC HISTORY: Oncology History  Malignant neoplasm of lower-inner quadrant of right breast of female, estrogen receptor negative (HCC)  01/10/2023 Initial Diagnosis   Palpable lump right axilla x 2 weeks bulky axillary lymphadenopathy ultrasound breast: 2 masses at 4 o'clock position 1.1 cm and 0.6 cm multiple axillary lymph nodes largest 4.5 cm also level 2 and 3 (subpectoral lymph node as well): Biopsy: Grade 3 IDC ER 0%, PR 0%, Ki-67 95%, HER2 1+   01/26/2023 Genetic Testing   Negative [employer]ae Custom Panel (37 genes).  Report date is 01/26/2023.    The [employer]ae Custom Cancers + RNA Panel includes sequencing, deletion/duplication, and RNA analysis of the following 37 genes: APC, ATM, AXIN2, BARD1, BMPR1A, BRCA1, BRCA2, BRIP1, CDH1, CDK4*, CDKN2A (p14ARF)*, CDKN2A (p16INK4a), CHEK2, CTNNA1, DICER1, EPCAM*, FH, GREM1*, HOXB13*, MBD4*, MLH1, MSH2, MSH3, MSH6, MUTYH, NF1, NTHL1, PALB2, PMS2, POLD1, POLE, PTEN, RAD51C, RAD51D, SMAD4, SMARCA4, STK11, TP53.  *Genes without RNA analysis.    01/28/2023 -  Chemotherapy   Patient is on Treatment Plan : BREAST Pembrolizumab (200) D1 + Carboplatin (1.5) D1,8,15 + Paclitaxel (80) D1,8,15 q21d X 4 cycles / Pembrolizumab (200) D1 + AC D1 q21d x 4 cycles       CHIEF COMPLIANT: Cycle 7 Taxol carbo (with Keytruda every 3 weeks)   INTERVAL HISTORY: Malvika Allem  is a 42 y.o. female is here because of recent diagnosis of right breast cancer. Currently on Cycle 5 Taxol carbo. She presents to the clinic for a follow-up. She reports that she has spots on her head that was tender and itchy, but it has resolved. She complains of joint stiffness, more like aches. She says it is not that bad. She denies watery eyes. Denies any numbness and tingling.  ALLERGIES:  is allergic to morphine; tessalon [benzonatate]; hydrocodone; latex; antihistamines, loratadine-type; codeine; and tramadol.  MEDICATIONS:  Current Outpatient Medications  Medication Sig Dispense Refill   acetaminophen (TYLENOL) 500 MG tablet Take 1,000 mg by mouth in the morning and at bedtime.     amoxicillin (AMOXIL) 500 MG tablet Take 1 tablet (500 mg total) by mouth 2 (two) times daily. 14 tablet 0   calcium carbonate (TUMS - DOSED IN MG ELEMENTAL CALCIUM) 500 MG chewable tablet Chew 1,000 mg by mouth as needed for indigestion or heartburn. (Patient not taking: Reported on 01/26/2023)     dexamethasone (DECADRON) 4 MG tablet Take 1 tablet (4 mg total) by mouth daily. Take with Adriamycin 1 tablet day after chemo and 1 tablet 2 days after chemo with food (Patient not taking: Reported on 01/25/2023) 8 tablet 1   fexofenadine (ALLEGRA) 180 MG tablet Take 180 mg by mouth as needed for allergies or rhinitis. (Patient not taking: Reported on 01/26/2023)     fluticasone (FLONASE) 50 MCG/ACT nasal spray Place 2 sprays into both nostrils daily. (Patient not taking: Reported on 01/26/2023) 16 g 0   lidocaine-prilocaine (EMLA) cream Apply to affected area once (Patient not taking:  Reported on 01/25/2023) 30 g 3   ondansetron (ZOFRAN) 8 MG tablet Take 1 tablet (8 mg total) by mouth every 8 (eight) hours as needed for nausea or vomiting. Start on the third day after chemotherapy. (Patient not taking: Reported on 01/25/2023) 30 tablet 1   prochlorperazine (COMPAZINE) 10 MG tablet Take 1 tablet (10 mg total) by mouth every 6  (six) hours as needed for nausea or vomiting. (Patient not taking: Reported on 01/25/2023) 30 tablet 1   No current facility-administered medications for this visit.    PHYSICAL EXAMINATION: ECOG PERFORMANCE STATUS: 1 - Symptomatic but completely ambulatory  Vitals:   03/11/23 1144  BP: 121/80  Pulse: 84  Resp: 18  Temp: 98.1 F (36.7 C)  SpO2: 100%   Filed Weights   03/11/23 1144  Weight: 145 lb 6 oz (65.9 kg)      LABORATORY DATA:  I have reviewed the data as listed    Latest Ref Rng & Units 03/04/2023   12:12 PM 02/25/2023   10:58 AM 02/18/2023    9:45 AM  CMP  Glucose 70 - 99 mg/dL 784  92  94   BUN 6 - 20 mg/dL 15  15  10    Creatinine 0.44 - 1.00 mg/dL 6.96  2.95  2.84   Sodium 135 - 145 mmol/L 138  137  138   Potassium 3.5 - 5.1 mmol/L 4.1  4.1  4.0   Chloride 98 - 111 mmol/L 106  105  106   CO2 22 - 32 mmol/L 26  27  28    Calcium 8.9 - 10.3 mg/dL 8.9  8.8  9.0   Total Protein 6.5 - 8.1 g/dL 6.8  6.6  6.8   Total Bilirubin 0.3 - 1.2 mg/dL 0.4  0.6  0.5   Alkaline Phos 38 - 126 U/L 45  42  45   AST 15 - 41 U/L 16  16  15    ALT 0 - 44 U/L 12  11  11      Lab Results  Component Value Date   WBC 2.3 (L) 03/11/2023   HGB 10.3 (L) 03/11/2023   HCT 30.3 (L) 03/11/2023   MCV 90.2 03/11/2023   PLT 165 03/11/2023   NEUTROABS 1.2 (L) 03/11/2023    ASSESSMENT & PLAN:  Malignant neoplasm of lower-inner quadrant of right breast of female, estrogen receptor negative (HCC) 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%, HER2 1+  01/25/2023: PET/CT: Right breast cancer, right axilla, retropectoral and right cervical lymph nodes   Treatment plan: Neoadjuvant chemotherapy with Taxol carbo Keytruda weekly x 12 followed by Adriamycin  Cytoxan Keytruda followed by Rande Lawman maintenance Breast conserving surgery with ALND Adjuvant radiation therapy ------------------------------------------------------------------------------------------------------------------------------------------- Current treatment: Cycle 7 Taxol carbo (with Keytruda every 3 weeks) Chemo toxicities: Anxiety/panic attack: Resolved Mild fatigue Leukopenia: ANC 1.2.  We plan to give Granix injection 2 days before each of her next of weekly chemotherapy treatments. Okay to treat with an ANC of 1.2.  Denies any nausea or vomiting   Return to clinic weekly for chemotherapy and every other week for follow-up with me.  No orders of the defined types were placed in this encounter.  The patient has a good understanding of the overall plan. she  agrees with it. she will call with any problems that may develop before the next visit here. Total time spent: 30 mins including face to face time and time spent for planning, charting and co-ordination of care   Tamsen Meek, MD 03/11/23    I Janan Ridge am acting as a Neurosurgeon for [employer]  I have reviewed the above documentation for accuracy and completeness, and I agree with the above.

## 2023-03-03 MED FILL — Dexamethasone Sodium Phosphate Inj 100 MG/10ML: INTRAMUSCULAR | Qty: 1 | Status: AC

## 2023-03-04 ENCOUNTER — Encounter: Payer: Self-pay | Admitting: *Deleted

## 2023-03-04 ENCOUNTER — Encounter: Payer: Self-pay | Admitting: General Practice

## 2023-03-04 ENCOUNTER — Inpatient Hospital Stay: Payer: BC Managed Care – PPO

## 2023-03-04 ENCOUNTER — Inpatient Hospital Stay: Payer: BC Managed Care – PPO | Admitting: Dietician

## 2023-03-04 VITALS — BP 124/78 | HR 78 | Temp 98.2°F | Resp 18 | Wt 143.0 lb

## 2023-03-04 DIAGNOSIS — C50311 Malignant neoplasm of lower-inner quadrant of right female breast: Secondary | ICD-10-CM

## 2023-03-04 DIAGNOSIS — Z79899 Other long term (current) drug therapy: Secondary | ICD-10-CM | POA: Diagnosis not present

## 2023-03-04 DIAGNOSIS — Z95828 Presence of other vascular implants and grafts: Secondary | ICD-10-CM

## 2023-03-04 DIAGNOSIS — Z5111 Encounter for antineoplastic chemotherapy: Secondary | ICD-10-CM | POA: Diagnosis not present

## 2023-03-04 DIAGNOSIS — Z171 Estrogen receptor negative status [ER-]: Secondary | ICD-10-CM | POA: Diagnosis not present

## 2023-03-04 LAB — CMP (CANCER CENTER ONLY)
ALT: 12 U/L (ref 0–44)
AST: 16 U/L (ref 15–41)
Albumin: 4.2 g/dL (ref 3.5–5.0)
Alkaline Phosphatase: 45 U/L (ref 38–126)
Anion gap: 6 (ref 5–15)
BUN: 15 mg/dL (ref 6–20)
CO2: 26 mmol/L (ref 22–32)
Calcium: 8.9 mg/dL (ref 8.9–10.3)
Chloride: 106 mmol/L (ref 98–111)
Creatinine: 0.56 mg/dL (ref 0.44–1.00)
GFR, Estimated: >60 mL/min (ref >60)
Glucose, Bld: 109 mg/dL — ABNORMAL HIGH (ref 70–99)
Potassium: 4.1 mmol/L (ref 3.5–5.1)
Sodium: 138 mmol/L (ref 135–145)
Total Bilirubin: 0.4 mg/dL (ref 0.3–1.2)
Total Protein: 6.8 g/dL (ref 6.5–8.1)

## 2023-03-04 LAB — CBC WITH DIFFERENTIAL (CANCER CENTER ONLY)
Abs Immature Granulocytes: 0.01 10*3/uL (ref 0.00–0.07)
Basophils Absolute: 0 10*3/uL (ref 0.0–0.1)
Basophils Relative: 0 %
Eosinophils Absolute: 0 10*3/uL (ref 0.0–0.5)
Eosinophils Relative: 1 %
HCT: 29.8 % — ABNORMAL LOW (ref 36.0–46.0)
Hemoglobin: 10.3 g/dL — ABNORMAL LOW (ref 12.0–15.0)
Immature Granulocytes: 0 %
Lymphocytes Relative: 31 %
Lymphs Abs: 0.9 10*3/uL (ref 0.7–4.0)
MCH: 30.8 pg (ref 26.0–34.0)
MCHC: 34.6 g/dL (ref 30.0–36.0)
MCV: 89.2 fL (ref 80.0–100.0)
Monocytes Absolute: 0.2 10*3/uL (ref 0.1–1.0)
Monocytes Relative: 7 %
Neutro Abs: 1.6 10*3/uL — ABNORMAL LOW (ref 1.7–7.7)
Neutrophils Relative %: 61 %
Platelet Count: 155 10*3/uL (ref 150–400)
RBC: 3.34 MIL/uL — ABNORMAL LOW (ref 3.87–5.11)
RDW: 13.6 % (ref 11.5–15.5)
WBC Count: 2.8 10*3/uL — ABNORMAL LOW (ref 4.0–10.5)
nRBC: 0 % (ref 0.0–0.2)

## 2023-03-04 MED ORDER — SODIUM CHLORIDE 0.9 % IV SOLN
10.0000 mg | Freq: Once | INTRAVENOUS | Status: AC
  Administered 2023-03-04: 10 mg via INTRAVENOUS
  Filled 2023-03-04: qty 10

## 2023-03-04 MED ORDER — SODIUM CHLORIDE 0.9% FLUSH
10.0000 mL | Freq: Once | INTRAVENOUS | Status: AC
  Administered 2023-03-04: 10 mL

## 2023-03-04 MED ORDER — SODIUM CHLORIDE 0.9 % IV SOLN
80.0000 mg/m2 | Freq: Once | INTRAVENOUS | Status: AC
  Administered 2023-03-04: 138 mg via INTRAVENOUS
  Filled 2023-03-04: qty 23

## 2023-03-04 MED ORDER — SODIUM CHLORIDE 0.9 % IV SOLN: Freq: Once | INTRAVENOUS | Status: AC

## 2023-03-04 MED ORDER — SODIUM CHLORIDE 0.9 % IV SOLN
187.2000 mg | Freq: Once | INTRAVENOUS | Status: AC
  Administered 2023-03-04: 190 mg via INTRAVENOUS
  Filled 2023-03-04: qty 19

## 2023-03-04 MED ORDER — PALONOSETRON HCL INJECTION 0.25 MG/5ML
0.2500 mg | Freq: Once | INTRAVENOUS | Status: AC
  Administered 2023-03-04: 0.25 mg via INTRAVENOUS
  Filled 2023-03-04: qty 5

## 2023-03-04 MED ORDER — DIPHENHYDRAMINE HCL 50 MG/ML IJ SOLN
25.0000 mg | Freq: Once | INTRAMUSCULAR | Status: AC
  Administered 2023-03-04: 25 mg via INTRAVENOUS
  Filled 2023-03-04: qty 1

## 2023-03-04 MED ORDER — FAMOTIDINE IN NACL 20-0.9 MG/50ML-% IV SOLN
20.0000 mg | Freq: Once | INTRAVENOUS | Status: AC
  Administered 2023-03-04: 20 mg via INTRAVENOUS
  Filled 2023-03-04: qty 50

## 2023-03-04 NOTE — Progress Notes (Signed)
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 She is currently receiving carboplatin/paclitaxel + keytruda q21d. Patient is under the care of Dr. [name].   No significant past medical history  Met with patient in infusion. She is accompanied by close friend today. Patient reports appetite is good. Says she has felt ravenous the last few days. Recalls banana, bagel, vegan bacon for breakfast, stromboli (mushrooms, green peppers) for lunch, vegan dumplings with edamame for dinner. Snacked on watermelon, blueberries, vegan meatballs. Patient states she was "bad" yesterday for eating bread. Patient reports being told that sugar feeds cancer and should not eat carbohydrates. Patient denies nutrition impact symptoms.    Nutrition Focused Physical Exam: deferred   Medications: zofran, compazine   Labs: glucose 109   Anthropometrics:   Height: 5'1" Weight: 143 lb  UBW: 155 lb 2 oz (2/20) BMI: 27.02   NUTRITION DIAGNOSIS: Food and nutrition related knowledge deficit related to cancer as evidenced by no prior need for associated nutrition information    INTERVENTION:  Educated on importance of adequate calorie and protein energy intake to maintain weights/strength during treatment Discussed glucose as energy source for all cells, encouraged balanced diet that includes carbs, protein, and healthy fats - fact sheet provided and suggested referring to [facility] for evidenced based information Discussed plant-based proteins - handout provided Given pt is vegan would recommend checking B12 and supplementing as indicated    MONITORING, EVALUATION, GOAL: Patient will tolerate adequate calories and protein to minimize weight loss during treatment    Next Visit: No follow-up scheduled at this time. Contact information provided. Pt encouraged to call with nutrition questions/concerns

## 2023-03-04 NOTE — Patient Instructions (Signed)
[facility]  Discharge Instructions: Thank you for choosing [facility] to provide your oncology and hematology care.   If you have a lab appointment with the Cancer Center, please go directly to the Cancer Center and check in at the registration area.   Wear comfortable clothing and clothing appropriate for easy access to any Portacath or PICC line.   We strive to give you quality time with your provider. You may need to reschedule your appointment if you arrive late (15 or more minutes).  Arriving late affects you and other patients whose appointments are after yours.  Also, if you miss three or more appointments without notifying the office, you may be dismissed from the clinic at the provider's discretion.      For prescription refill requests, have your pharmacy contact our office and allow 72 hours for refills to be completed.    Today you received the following chemotherapy and/or immunotherapy agents - Taxol/Carboplatin      To help prevent nausea and vomiting after your treatment, we encourage you to take your nausea medication as directed.  BELOW ARE SYMPTOMS THAT SHOULD BE REPORTED IMMEDIATELY: *FEVER GREATER THAN 100.4 F (38 C) OR HIGHER *CHILLS OR SWEATING *NAUSEA AND VOMITING THAT IS NOT CONTROLLED WITH YOUR NAUSEA MEDICATION *UNUSUAL SHORTNESS OF BREATH *UNUSUAL BRUISING OR BLEEDING *URINARY PROBLEMS (pain or burning when urinating, or frequent urination) *BOWEL PROBLEMS (unusual diarrhea, constipation, pain near the anus) TENDERNESS IN MOUTH AND THROAT WITH OR WITHOUT PRESENCE OF ULCERS (sore throat, sores in mouth, or a toothache) UNUSUAL RASH, SWELLING OR PAIN  UNUSUAL VAGINAL DISCHARGE OR ITCHING   Items with * indicate a potential emergency and should be followed up as soon as possible or go to the Emergency Department if any problems should occur.  Please show the CHEMOTHERAPY ALERT CARD or IMMUNOTHERAPY ALERT CARD  at check-in to the Emergency Department and triage nurse.  Should you have questions after your visit or need to cancel or reschedule your appointment, please contact [facility]  Dept: [phone]  and follow the prompts.  Office hours are 8:00 a.m. to 4:30 p.m. Monday - Friday. Please note that voicemails left after 4:00 p.m. may not be returned until the following business day.  We are closed weekends and major holidays. You have access to a nurse at all times for urgent questions. Please call the main number to the clinic Dept: [phone] and follow the prompts.   For any non-urgent questions, you may also contact your provider using MyChart. We now offer e-Visits for anyone 18 and older to request care online for non-urgent symptoms. For details visit [email or web address].   Also download the MyChart app! Go to the app store, search "MyChart", open the app, select [facility], and log in with your MyChart username and password.  

## 2023-03-04 NOTE — Research (Signed)
TRIAL S2205, ICE COMPRESS: RANDOMIZED TRIAL OF LIMB CRYOCOMPRESSION VERSUS CONTINUOUS COMPRESSION VERSUS LOW CYCLIC COMPRESSION FOR THE PREVENTION  OF TAXANE-INDUCED PERIPHERAL NEUROPATHY   Patient arrives today Accompanied by her friend Suzanne Tucker  for the Week 6 visit. Confirmed no wounds, lesions, or sore to extremities.    1412 Wraps applied; precooling started. 1444 Taxane started; moved to cooling phase. 1446 Restroom break 1453 Restart after bathroom (7 minutes will need to be added) 1518 Break 1521 Restart (3 minutes need to be added) 1544 Taxane done; patient needs 10 minutes added to make up. 1554 Switched to post-cooling. 1624 Done; wraps removed. No wounds, sores or lesions to extremities.   ADVERSE EVENTS: [name] reports no AEs.  ADVERSE EVENT LOGAngelicia Tucker [id number]  03/04/2023  Adverse Event Log  Study/Protocol: S2205, ICE COMPRESS: RANDOMIZED TRIAL OF LIMB CRYOCOMPRESSION VERSUS CONTINUOUS COMPRESSION VERSUS LOW CYCLIC COMPRESSION FOR THE PREVENTION  OF TAXANE-INDUCED PERIPHERAL NEUROPATHY Cycle: Week 6  Event Grade Onset Date Resolved Date Drug Name Attribution Treatment Comments                                                          DISPOSITION: Upon completion off all study requirements, patient remained in infusion chair to have port de-accessed. We discussed plan for neuro assessments next week during her visit.   The patient was thanked for their time and continued voluntary participation in this study. [name] has been provided direct contact information and is encouraged to contact this Nurse for any needs or questions.  Suzanne Chance Suzanne Colley, RN, BSN, [facility] She  Her  Hers Clinical Research Nurse [facility] Direct Dial [phone]  Pager [phone] 03/04/2023 4:43 PM    ADDENDUM on 03/07/23 to correct end time from 1424 to correct time of 1624.  Suzanne Chance Suzanne Walthall, RN, BSN, [facility] She  Her   Hers Clinical Research Nurse [facility] Direct Dial [phone]  Pager [phone] 03/07/2023 9:29 AM

## 2023-03-04 NOTE — Progress Notes (Signed)
[facility] Spiritual Care Note  Followed up with Suzanne Tucker in infusion. She was in good spirits today. We talked about her strategies for keeping busy with meaning-making activities while she is out of school for the summer, as well as balancing activity level with the need to process her treatment experience and feelings about diagnosis. We also addressed potential support resources for her husband Suzanne Tucker, who lost his mom and his sister prior to Latrica's diagnosis.  Provided rapport-building and social conversation, reflective listening, emotional support,  normalization of feelings, and education about deferred grief/other processing.  We plan to follow up at a future treatment.   [address] Rush Barer, [place], [facility] Pager [phone] Voicemail [phone]

## 2023-03-09 ENCOUNTER — Other Ambulatory Visit: Payer: BC Managed Care – PPO

## 2023-03-09 ENCOUNTER — Ambulatory Visit: Payer: BC Managed Care – PPO | Admitting: Hematology and Oncology

## 2023-03-09 ENCOUNTER — Ambulatory Visit: Payer: BC Managed Care – PPO

## 2023-03-10 ENCOUNTER — Encounter: Payer: Self-pay | Admitting: General Practice

## 2023-03-10 MED FILL — Dexamethasone Sodium Phosphate Inj 100 MG/10ML: INTRAMUSCULAR | Qty: 1 | Status: AC

## 2023-03-10 NOTE — Progress Notes (Signed)
[facility] Spiritual Care Note  Followed up with Vauda by phone because I will miss her at treatment tomorrow. She was in very good spirits, noting that she is feeling good and looking forward to the end of school. 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  We plan to follow up at future treatments, and she is aware of ongoing chaplain availability in the meantime.   [address] Rush Barer, [place], [facility] Pager [phone] Voicemail [phone]

## 2023-03-11 ENCOUNTER — Other Ambulatory Visit: Payer: Self-pay | Admitting: *Deleted

## 2023-03-11 ENCOUNTER — Inpatient Hospital Stay (HOSPITAL_BASED_OUTPATIENT_CLINIC_OR_DEPARTMENT_OTHER): Payer: BC Managed Care – PPO | Admitting: Hematology and Oncology

## 2023-03-11 ENCOUNTER — Other Ambulatory Visit: Payer: Self-pay

## 2023-03-11 ENCOUNTER — Inpatient Hospital Stay: Payer: BC Managed Care – PPO | Attending: Hematology and Oncology

## 2023-03-11 ENCOUNTER — Inpatient Hospital Stay: Payer: BC Managed Care – PPO

## 2023-03-11 DIAGNOSIS — C50311 Malignant neoplasm of lower-inner quadrant of right female breast: Secondary | ICD-10-CM

## 2023-03-11 DIAGNOSIS — Z171 Estrogen receptor negative status [ER-]: Secondary | ICD-10-CM | POA: Insufficient documentation

## 2023-03-11 DIAGNOSIS — Z79620 Long term (current) use of immunosuppressive biologic: Secondary | ICD-10-CM | POA: Diagnosis not present

## 2023-03-11 DIAGNOSIS — Z95828 Presence of other vascular implants and grafts: Secondary | ICD-10-CM

## 2023-03-11 DIAGNOSIS — Z79899 Other long term (current) drug therapy: Secondary | ICD-10-CM | POA: Diagnosis not present

## 2023-03-11 DIAGNOSIS — Z5111 Encounter for antineoplastic chemotherapy: Secondary | ICD-10-CM | POA: Insufficient documentation

## 2023-03-11 LAB — CBC WITH DIFFERENTIAL (CANCER CENTER ONLY)
Abs Immature Granulocytes: 0.02 10*3/uL (ref 0.00–0.07)
Basophils Absolute: 0 10*3/uL (ref 0.0–0.1)
Basophils Relative: 0 %
Eosinophils Absolute: 0 10*3/uL (ref 0.0–0.5)
Eosinophils Relative: 2 %
HCT: 30.3 % — ABNORMAL LOW (ref 36.0–46.0)
Hemoglobin: 10.3 g/dL — ABNORMAL LOW (ref 12.0–15.0)
Immature Granulocytes: 1 %
Lymphocytes Relative: 42 %
Lymphs Abs: 1 10*3/uL (ref 0.7–4.0)
MCH: 30.7 pg (ref 26.0–34.0)
MCHC: 34 g/dL (ref 30.0–36.0)
MCV: 90.2 fL (ref 80.0–100.0)
Monocytes Absolute: 0.1 10*3/uL (ref 0.1–1.0)
Monocytes Relative: 4 %
Neutro Abs: 1.2 10*3/uL — ABNORMAL LOW (ref 1.7–7.7)
Neutrophils Relative %: 51 %
Platelet Count: 165 10*3/uL (ref 150–400)
RBC: 3.36 MIL/uL — ABNORMAL LOW (ref 3.87–5.11)
RDW: 13.7 % (ref 11.5–15.5)
WBC Count: 2.3 10*3/uL — ABNORMAL LOW (ref 4.0–10.5)
nRBC: 0 % (ref 0.0–0.2)

## 2023-03-11 LAB — CMP (CANCER CENTER ONLY)
ALT: 14 U/L (ref 0–44)
AST: 17 U/L (ref 15–41)
Albumin: 4.2 g/dL (ref 3.5–5.0)
Alkaline Phosphatase: 51 U/L (ref 38–126)
Anion gap: 5 (ref 5–15)
BUN: 10 mg/dL (ref 6–20)
CO2: 28 mmol/L (ref 22–32)
Calcium: 8.9 mg/dL (ref 8.9–10.3)
Chloride: 105 mmol/L (ref 98–111)
Creatinine: 0.58 mg/dL (ref 0.44–1.00)
GFR, Estimated: >60 mL/min (ref >60)
Glucose, Bld: 111 mg/dL — ABNORMAL HIGH (ref 70–99)
Potassium: 3.9 mmol/L (ref 3.5–5.1)
Sodium: 138 mmol/L (ref 135–145)
Total Bilirubin: 0.4 mg/dL (ref 0.3–1.2)
Total Protein: 7 g/dL (ref 6.5–8.1)

## 2023-03-11 LAB — TSH: TSH: 1.537 u[IU]/mL (ref 0.350–4.500)

## 2023-03-11 MED ORDER — SODIUM CHLORIDE 0.9 % IV SOLN
200.0000 mg | Freq: Once | INTRAVENOUS | Status: AC
  Administered 2023-03-11: 200 mg via INTRAVENOUS
  Filled 2023-03-11: qty 200

## 2023-03-11 MED ORDER — SODIUM CHLORIDE 0.9% FLUSH
10.0000 mL | INTRAVENOUS | Status: DC | PRN
  Administered 2023-03-11: 10 mL

## 2023-03-11 MED ORDER — FAMOTIDINE IN NACL 20-0.9 MG/50ML-% IV SOLN
20.0000 mg | Freq: Once | INTRAVENOUS | Status: AC
  Administered 2023-03-11: 20 mg via INTRAVENOUS
  Filled 2023-03-11: qty 50

## 2023-03-11 MED ORDER — SODIUM CHLORIDE 0.9 % IV SOLN
80.0000 mg/m2 | Freq: Once | INTRAVENOUS | Status: AC
  Administered 2023-03-11: 138 mg via INTRAVENOUS
  Filled 2023-03-11: qty 23

## 2023-03-11 MED ORDER — SODIUM CHLORIDE 0.9 % IV SOLN: Freq: Once | INTRAVENOUS | Status: AC

## 2023-03-11 MED ORDER — SODIUM CHLORIDE 0.9 % IV SOLN
10.0000 mg | Freq: Once | INTRAVENOUS | Status: AC
  Administered 2023-03-11: 10 mg via INTRAVENOUS
  Filled 2023-03-11: qty 10

## 2023-03-11 MED ORDER — DIPHENHYDRAMINE HCL 50 MG/ML IJ SOLN
25.0000 mg | Freq: Once | INTRAMUSCULAR | Status: AC
  Administered 2023-03-11: 25 mg via INTRAVENOUS
  Filled 2023-03-11: qty 1

## 2023-03-11 MED ORDER — SODIUM CHLORIDE 0.9% FLUSH
10.0000 mL | Freq: Once | INTRAVENOUS | Status: AC
  Administered 2023-03-11: 10 mL

## 2023-03-11 MED ORDER — SODIUM CHLORIDE 0.9 % IV SOLN
187.2000 mg | Freq: Once | INTRAVENOUS | Status: AC
  Administered 2023-03-11: 190 mg via INTRAVENOUS
  Filled 2023-03-11: qty 19

## 2023-03-11 MED ORDER — PALONOSETRON HCL INJECTION 0.25 MG/5ML
0.2500 mg | Freq: Once | INTRAVENOUS | Status: AC
  Administered 2023-03-11: 0.25 mg via INTRAVENOUS
  Filled 2023-03-11: qty 5

## 2023-03-11 MED ORDER — HEPARIN SOD (PORK) LOCK FLUSH 100 UNIT/ML IV SOLN
500.0000 [IU] | Freq: Once | INTRAVENOUS | Status: AC | PRN
  Administered 2023-03-11: 500 [IU]

## 2023-03-11 NOTE — Assessment & Plan Note (Signed)
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%, HER2 1+  01/25/2023: PET/CT: Right breast cancer, right axilla, retropectoral and right cervical lymph nodes   Treatment plan: Neoadjuvant chemotherapy with Taxol carbo Keytruda weekly x 12 followed by Adriamycin Cytoxan Keytruda followed by Rande Lawman maintenance Breast conserving surgery with ALND Adjuvant radiation therapy ------------------------------------------------------------------------------------------------------------------------------------------- Current treatment: Cycle 7 Taxol carbo (with Keytruda every 3 weeks) Chemo toxicities: Anxiety/panic attack: Resolved Mild fatigue Leukopenia: Monitoring ANC closely. Denies any nausea or vomiting   Return to clinic weekly for chemotherapy and every other week for follow-up with me.

## 2023-03-11 NOTE — Research (Unsigned)
TRIAL S2205, ICE COMPRESS: RANDOMIZED TRIAL OF LIMB CRYOCOMPRESSION VERSUS CONTINUOUS COMPRESSION VERSUS LOW CYCLIC COMPRESSION FOR THE PREVENTION  OF TAXANE-INDUCED PERIPHERAL NEUROPATHY   Patient arrives today Accompanied by her husband  for the Week 7 chemo/Week 6 assessments. Confirmed no wounds, lesions or sores to extremities.  1432 Wraps applied; pre-cooling started. 1515 Taxane started; moved to cooling phase. 1529 Bathroom break. 1538 Restart after bathroom break; will need to make up 9 minutes. 1619 Taxane done. 1628 Make up time complete; moved to post-cooling. 1658 Post-cooling done; wraps removed. No wounds, sores, or lesions to extremities.  PROs: Per study protocol, all PROs required for this visit were completed prior to other study activities and completeness has been verified.     LABS: Mandatory and optional labs are collected per consent and study protocol: [name] tolerated well without complaint.   MD/PROVIDER VISIT: Patient sees Dr [name] for today's visit.   ADVERSE EVENTS: [name] reports no AE.  ADVERSE EVENT LOGEldean Klatt [id number]  03/11/2023  Adverse Event Log   Event Grade Onset Date Resolved Date Drug Name Attribution Treatment Comments                                                          NEURO ASSESSMENT: The neuro assessment was completed by this nurse. [name] tolerated all testing without complaint.   GIFT CARD: This study does not provide visit compensation.   DISPOSITION: Upon completion off all study requirements, patient was escorted to ***.   The patient was thanked for their time and continued voluntary participation in this study. [name] has been provided direct contact information and is encouraged to contact this Nurse for any needs or questions.  Margret Chance Hevin Jeffcoat, RN, BSN, [facility] She  Her  Hers Clinical Research Nurse [facility] Direct Dial [phone]  Pager [phone] 03/11/2023 5:11 PM

## 2023-03-12 LAB — T4: T4, Total: 7.4 ug/dL (ref 4.5–12.0)

## 2023-03-14 ENCOUNTER — Telehealth: Payer: Self-pay | Admitting: Hematology and Oncology

## 2023-03-14 ENCOUNTER — Other Ambulatory Visit: Payer: Self-pay | Admitting: *Deleted

## 2023-03-14 MED ORDER — AMOXICILLIN 500 MG PO TABS: 2000.0000 mg | ORAL_TABLET | Freq: Two times a day (BID) | ORAL | 0 refills | Status: DC

## 2023-03-14 NOTE — Progress Notes (Signed)
Received call from pt stating she is scheduled to have 2 cavities filled 03/17/23.  Per NP pt needing to take Amoxicillin 2 grams p.o the morning of dental procedure.  Prescription sent to pharmacy on file, pt educated and verbalized understanding.

## 2023-03-14 NOTE — Telephone Encounter (Signed)
Scheduled appointments per staff message. Left voicemail. 

## 2023-03-16 ENCOUNTER — Inpatient Hospital Stay: Payer: BC Managed Care – PPO

## 2023-03-16 ENCOUNTER — Other Ambulatory Visit: Payer: Self-pay

## 2023-03-16 DIAGNOSIS — C50311 Malignant neoplasm of lower-inner quadrant of right female breast: Secondary | ICD-10-CM | POA: Diagnosis not present

## 2023-03-16 DIAGNOSIS — Z171 Estrogen receptor negative status [ER-]: Secondary | ICD-10-CM

## 2023-03-16 MED ORDER — FILGRASTIM-SNDZ 300 MCG/0.5ML IJ SOSY
300.0000 ug | PREFILLED_SYRINGE | Freq: Once | INTRAMUSCULAR | Status: AC
  Administered 2023-03-16: 300 ug via SUBCUTANEOUS
  Filled 2023-03-16: qty 0.5

## 2023-03-17 MED FILL — Dexamethasone Sodium Phosphate Inj 100 MG/10ML: INTRAMUSCULAR | Qty: 1 | Status: AC

## 2023-03-18 ENCOUNTER — Inpatient Hospital Stay: Payer: BC Managed Care – PPO

## 2023-03-18 ENCOUNTER — Encounter: Payer: Self-pay | Admitting: General Practice

## 2023-03-18 ENCOUNTER — Other Ambulatory Visit: Payer: Self-pay

## 2023-03-18 ENCOUNTER — Encounter: Payer: Self-pay | Admitting: Emergency Medicine

## 2023-03-18 ENCOUNTER — Encounter: Payer: Self-pay | Admitting: Hematology and Oncology

## 2023-03-18 VITALS — BP 115/67 | HR 86 | Temp 98.2°F | Resp 18 | Wt 144.0 lb

## 2023-03-18 DIAGNOSIS — Z95828 Presence of other vascular implants and grafts: Secondary | ICD-10-CM

## 2023-03-18 DIAGNOSIS — Z171 Estrogen receptor negative status [ER-]: Secondary | ICD-10-CM

## 2023-03-18 DIAGNOSIS — C50311 Malignant neoplasm of lower-inner quadrant of right female breast: Secondary | ICD-10-CM | POA: Diagnosis not present

## 2023-03-18 LAB — CBC WITH DIFFERENTIAL (CANCER CENTER ONLY)
Abs Immature Granulocytes: 0.08 10*3/uL — ABNORMAL HIGH (ref 0.00–0.07)
Basophils Absolute: 0 10*3/uL (ref 0.0–0.1)
Basophils Relative: 0 %
Eosinophils Absolute: 0 10*3/uL (ref 0.0–0.5)
Eosinophils Relative: 1 %
HCT: 27.3 % — ABNORMAL LOW (ref 36.0–46.0)
Hemoglobin: 9.3 g/dL — ABNORMAL LOW (ref 12.0–15.0)
Immature Granulocytes: 2 %
Lymphocytes Relative: 21 %
Lymphs Abs: 0.8 10*3/uL (ref 0.7–4.0)
MCH: 31.1 pg (ref 26.0–34.0)
MCHC: 34.1 g/dL (ref 30.0–36.0)
MCV: 91.3 fL (ref 80.0–100.0)
Monocytes Absolute: 0.2 10*3/uL (ref 0.1–1.0)
Monocytes Relative: 6 %
Neutro Abs: 2.6 10*3/uL (ref 1.7–7.7)
Neutrophils Relative %: 70 %
Platelet Count: 185 10*3/uL (ref 150–400)
RBC: 2.99 MIL/uL — ABNORMAL LOW (ref 3.87–5.11)
RDW: 14.4 % (ref 11.5–15.5)
Smear Review: NORMAL
WBC Count: 3.7 10*3/uL — ABNORMAL LOW (ref 4.0–10.5)
nRBC: 0 % (ref 0.0–0.2)

## 2023-03-18 LAB — CMP (CANCER CENTER ONLY)
ALT: 15 U/L (ref 0–44)
AST: 17 U/L (ref 15–41)
Albumin: 3.7 g/dL (ref 3.5–5.0)
Alkaline Phosphatase: 53 U/L (ref 38–126)
Anion gap: 5 (ref 5–15)
BUN: 10 mg/dL (ref 6–20)
CO2: 26 mmol/L (ref 22–32)
Calcium: 8.5 mg/dL — ABNORMAL LOW (ref 8.9–10.3)
Chloride: 106 mmol/L (ref 98–111)
Creatinine: 0.51 mg/dL (ref 0.44–1.00)
GFR, Estimated: >60 mL/min (ref >60)
Glucose, Bld: 107 mg/dL — ABNORMAL HIGH (ref 70–99)
Potassium: 4 mmol/L (ref 3.5–5.1)
Sodium: 137 mmol/L (ref 135–145)
Total Bilirubin: 0.3 mg/dL (ref 0.3–1.2)
Total Protein: 6.2 g/dL — ABNORMAL LOW (ref 6.5–8.1)

## 2023-03-18 MED ORDER — HEPARIN SOD (PORK) LOCK FLUSH 100 UNIT/ML IV SOLN
500.0000 [IU] | Freq: Once | INTRAVENOUS | Status: AC | PRN
  Administered 2023-03-18: 500 [IU]

## 2023-03-18 MED ORDER — DIPHENHYDRAMINE HCL 50 MG/ML IJ SOLN
25.0000 mg | Freq: Once | INTRAMUSCULAR | Status: AC
  Administered 2023-03-18: 25 mg via INTRAVENOUS
  Filled 2023-03-18: qty 1

## 2023-03-18 MED ORDER — SODIUM CHLORIDE 0.9 % IV SOLN
10.0000 mg | Freq: Once | INTRAVENOUS | Status: AC
  Administered 2023-03-18: 10 mg via INTRAVENOUS
  Filled 2023-03-18: qty 10

## 2023-03-18 MED ORDER — SODIUM CHLORIDE 0.9% FLUSH
10.0000 mL | INTRAVENOUS | Status: DC | PRN
  Administered 2023-03-18: 10 mL

## 2023-03-18 MED ORDER — SODIUM CHLORIDE 0.9% FLUSH
10.0000 mL | Freq: Once | INTRAVENOUS | Status: AC
  Administered 2023-03-18: 10 mL

## 2023-03-18 MED ORDER — FAMOTIDINE IN NACL 20-0.9 MG/50ML-% IV SOLN
20.0000 mg | Freq: Once | INTRAVENOUS | Status: AC
  Administered 2023-03-18: 20 mg via INTRAVENOUS
  Filled 2023-03-18: qty 50

## 2023-03-18 MED ORDER — SODIUM CHLORIDE 0.9 % IV SOLN
187.2000 mg | Freq: Once | INTRAVENOUS | Status: AC
  Administered 2023-03-18: 190 mg via INTRAVENOUS
  Filled 2023-03-18: qty 19

## 2023-03-18 MED ORDER — SODIUM CHLORIDE 0.9 % IV SOLN: Freq: Once | INTRAVENOUS | Status: AC

## 2023-03-18 MED ORDER — PALONOSETRON HCL INJECTION 0.25 MG/5ML
0.2500 mg | Freq: Once | INTRAVENOUS | Status: AC
  Administered 2023-03-18: 0.25 mg via INTRAVENOUS
  Filled 2023-03-18: qty 5

## 2023-03-18 MED ORDER — SODIUM CHLORIDE 0.9 % IV SOLN
80.0000 mg/m2 | Freq: Once | INTRAVENOUS | Status: AC
  Administered 2023-03-18: 138 mg via INTRAVENOUS
  Filled 2023-03-18: qty 23

## 2023-03-18 NOTE — Research (Signed)
Z6109, ICE COMPRESS: RANDOMIZED TRIAL OF LIMB CRYOCOMPRESSION VERSUS CONTINUOUS COMPRESSION VERSUS LOW CYCLIC COMPRESSION FOR THE PREVENTION  OF TAXANE-INDUCED PERIPHERAL NEUROPATHY  Received report from Lambert Keto RN today (03/18/23) and took over patient care.  1446- Patient used restroom (during active cooling phase). 1453- Returned from restroom.  7 minutes will be added on to end of tx time to make up for time lost. 1542- Taxane completed. 1549- Patient switched to post-cooling phase to allot for 7 additional minutes. 1625- Patient completed post-cooling phase.  Patient tolerated treatment well, no adverse events reported during/after treatment.  Skin unchanged at end of treatment.  Lurena Joiner 'Warden Fillers' Dairl Ponder, RN, BSN, [facility] Clinical Research Nurse I 03/18/23 4:30 PM

## 2023-03-18 NOTE — Patient Instructions (Signed)
[facility]  Discharge Instructions: Thank you for choosing [facility] to provide your oncology and hematology care.   If you have a lab appointment with the Cancer Center, please go directly to the Cancer Center and check in at the registration area.   Wear comfortable clothing and clothing appropriate for easy access to any Portacath or PICC line.   We strive to give you quality time with your provider. You may need to reschedule your appointment if you arrive late (15 or more minutes).  Arriving late affects you and other patients whose appointments are after yours.  Also, if you miss three or more appointments without notifying the office, you may be dismissed from the clinic at the provider's discretion.      For prescription refill requests, have your pharmacy contact our office and allow 72 hours for refills to be completed.    Today you received the following chemotherapy and/or immunotherapy agents: Paclitaxel, Carboplatin.       To help prevent nausea and vomiting after your treatment, we encourage you to take your nausea medication as directed.  BELOW ARE SYMPTOMS THAT SHOULD BE REPORTED IMMEDIATELY: *FEVER GREATER THAN 100.4 F (38 C) OR HIGHER *CHILLS OR SWEATING *NAUSEA AND VOMITING THAT IS NOT CONTROLLED WITH YOUR NAUSEA MEDICATION *UNUSUAL SHORTNESS OF BREATH *UNUSUAL BRUISING OR BLEEDING *URINARY PROBLEMS (pain or burning when urinating, or frequent urination) *BOWEL PROBLEMS (unusual diarrhea, constipation, pain near the anus) TENDERNESS IN MOUTH AND THROAT WITH OR WITHOUT PRESENCE OF ULCERS (sore throat, sores in mouth, or a toothache) UNUSUAL RASH, SWELLING OR PAIN  UNUSUAL VAGINAL DISCHARGE OR ITCHING   Items with * indicate a potential emergency and should be followed up as soon as possible or go to the Emergency Department if any problems should occur.  Please show the CHEMOTHERAPY ALERT CARD or IMMUNOTHERAPY  ALERT CARD at check-in to the Emergency Department and triage nurse.  Should you have questions after your visit or need to cancel or reschedule your appointment, please contact [facility]  Dept: [phone]  and follow the prompts.  Office hours are 8:00 a.m. to 4:30 p.m. Monday - Friday. Please note that voicemails left after 4:00 p.m. may not be returned until the following business day.  We are closed weekends and major holidays. You have access to a nurse at all times for urgent questions. Please call the main number to the clinic Dept: [phone] and follow the prompts.   For any non-urgent questions, you may also contact your provider using MyChart. We now offer e-Visits for anyone 18 and older to request care online for non-urgent symptoms. For details visit [email or web address].   Also download the MyChart app! Go to the app store, search "MyChart", open the app, select [facility], and log in with your MyChart username and password.   

## 2023-03-18 NOTE — Progress Notes (Signed)
[facility] Spiritual Care Note  Followed up with Suzanne Tucker in the [facility] Room (infusion subwait) for a pastoral check-in. She is finding ways to engage herself over summer break and has several family touchstone events that will shape the time. Suzanne Tucker is particularly looking forward to Halloween (and then other fall and winter holidays) this year because it will mark the end of her treatment. She continues to present as upbeat and thriving.  Provided empathic listening, witness to her story, and emotional and social support.  We plan to follow up at a future treatment, and she is aware of ongoing Spiritual Care availability in the meantime.    [address] Rush Barer, [place], [facility] Pager [phone] Voicemail [phone]

## 2023-03-18 NOTE — Research (Signed)
TRIAL S2205, ICE COMPRESS: RANDOMIZED TRIAL OF LIMB CRYOCOMPRESSION VERSUS CONTINUOUS COMPRESSION VERSUS LOW CYCLIC COMPRESSION FOR THE PREVENTION  OF TAXANE-INDUCED PERIPHERAL NEUROPATHY   Patient arrives today Accompanied by her friend [name]  for the Week 8 visit. Confirmed no wounds, lesions, or sores to extremities.   1354 Precooling started on legs; arm machine malfunctioning. 1407 New machine in place; cooling started on legs. 1420 Tolerable. 1440 Paclitaxel started; moved to cooling phase.  RN Melany Guernsey will assume care/finish visit.  ADVERSE EVENTS: [name] reports no AEs.  ADVERSE EVENT LOGSeleta Alemayehu [id number]  03/18/2023  Adverse Event Log  Event Grade Onset Date Resolved Date Drug Name Attribution Treatment Comments                                                          GIFT CARD: This study does not provide visit compensation.    The patient was thanked for their time and continued voluntary participation in this study. [name] has been provided direct contact information and is encouraged to contact this Nurse for any needs or questions.  Margret Chance Wane Mollett, RN, BSN, [facility] She  Her  Hers Clinical Research Nurse [facility] Direct Dial [phone]  Pager [phone] 03/18/2023 2:44 PM

## 2023-03-22 ENCOUNTER — Encounter: Payer: Self-pay | Admitting: Internal Medicine

## 2023-03-22 ENCOUNTER — Ambulatory Visit: Payer: BC Managed Care – PPO | Attending: Cardiology | Admitting: Internal Medicine

## 2023-03-22 ENCOUNTER — Telehealth: Payer: Self-pay

## 2023-03-22 DIAGNOSIS — Z79899 Other long term (current) drug therapy: Secondary | ICD-10-CM | POA: Diagnosis not present

## 2023-03-22 DIAGNOSIS — R002 Palpitations: Secondary | ICD-10-CM

## 2023-03-22 DIAGNOSIS — Z171 Estrogen receptor negative status [ER-]: Secondary | ICD-10-CM | POA: Diagnosis not present

## 2023-03-22 DIAGNOSIS — Z79620 Long term (current) use of immunosuppressive biologic: Secondary | ICD-10-CM | POA: Diagnosis not present

## 2023-03-22 DIAGNOSIS — Z5111 Encounter for antineoplastic chemotherapy: Secondary | ICD-10-CM | POA: Diagnosis present

## 2023-03-22 DIAGNOSIS — C50311 Malignant neoplasm of lower-inner quadrant of right female breast: Secondary | ICD-10-CM | POA: Diagnosis present

## 2023-03-22 NOTE — Patient Instructions (Signed)
Medication Instructions:  Your physician recommends that you continue on your current medications as directed. Please refer to the Current Medication list given to you today.  *If you need a refill on your cardiac medications before your next appointment, please call your pharmacy*  Follow-Up: At [facility], you and your health needs are our priority.  As part of our continuing mission to provide you with exceptional heart care, we have created designated Provider Care Teams.  These Care Teams include your primary Cardiologist (physician) and Advanced Practice Providers (APPs -  Physician Assistants and Nurse Practitioners) who all work together to provide you with the care you need, when you need it.  Your next appointment:   5 month(s)  Provider:   Sherryl Manges, MD

## 2023-03-22 NOTE — Progress Notes (Signed)
Electrophysiology TeleHealth Note      Date:  03/22/2023   ID:  Suzanne, Tucker Feb 20, 1981, MRN [id number]  Location: patient's home  Provider location: [address], [place]  Evaluation Performed: Follow-up visit  PCP:  Georgina Quint, MD  Cardiologist:    Electrophysiologist:  SK   Chief Complaint:  palpitations and dizziness   History of Present Illness:    Suzanne Tucker is a 42 y.o. female who presents via audio conferencing for a telehealth visit today.  Since last being seen in our clinic for palpitations associated with PVCs, syncope and presyncope with a stereotypical prodrome suggestive of dysautonomia the patient reports less dizziness and palpitations;  improved since starting chemotherapy-- tolerating well x alopecia   Interval of event recorder for palpitations>>  flutters>> mostly with sinus, on one occasion the above short run of atrial tachycardia was detected, and one PVC was also detected  (Not sure if any of her "aggressive"palpitations--none per patient)  .    The patient denies symptoms of fevers, chills, cough, or new SOB worrisome for COVID 19.    Past Medical History:  Diagnosis Date   Anginal pain (HCC) 02/2019   related to MVP   Back pain    Breast cancer (HCC)    Depression    Dysrhythmia    MVP   Mitral prolapse     Past Surgical History:  Procedure Laterality Date   BREAST SURGERY Right 2024   Breast biopsy x 2   CESAREAN SECTION  03/30/2008   PORTACATH PLACEMENT N/A 01/27/2023   Procedure: INSERTION PORT-A-CATH;  Surgeon: [name], MD;  Location: [facility];  Service: General;  Laterality: N/A;  leaving accessed   ROBOTIC ASSISTED TOTAL HYSTERECTOMY Bilateral 03/15/2019   Procedure: XI ROBOTIC ASSISTED TOTAL HYSTERECTOMY with Bilateral Salpingectomy;  Surgeon: Silverio Lay, MD;  Location: [facility];  Service: Gynecology;  Laterality: Bilateral;   TUBAL LIGATION  2017    Current  Outpatient Medications  Medication Sig Dispense Refill   acetaminophen (TYLENOL) 500 MG tablet Take 1,000 mg by mouth in the morning and at bedtime.     amoxicillin (AMOXIL) 500 MG tablet Take 4 tablets (2,000 mg total) by mouth 2 (two) times daily. Patient to take 4 tablets p.o morning of dental procedure. 4 tablet 0   calcium carbonate (TUMS - DOSED IN MG ELEMENTAL CALCIUM) 500 MG chewable tablet Chew 1,000 mg by mouth as needed for indigestion or heartburn.     dexamethasone (DECADRON) 4 MG tablet Take 1 tablet (4 mg total) by mouth daily. Take with Adriamycin 1 tablet day after chemo and 1 tablet 2 days after chemo with food 8 tablet 1   fexofenadine (ALLEGRA) 180 MG tablet Take 180 mg by mouth as needed for allergies or rhinitis.     fluticasone (FLONASE) 50 MCG/ACT nasal spray Place 2 sprays into both nostrils daily. 16 g 0   lidocaine-prilocaine (EMLA) cream Apply to affected area once 30 g 3   ondansetron (ZOFRAN) 8 MG tablet Take 1 tablet (8 mg total) by mouth every 8 (eight) hours as needed for nausea or vomiting. Start on the third day after chemotherapy. 30 tablet 1   prochlorperazine (COMPAZINE) 10 MG tablet Take 1 tablet (10 mg total) by mouth every 6 (six) hours as needed for nausea or vomiting. 30 tablet 1   No current facility-administered medications for this visit.    Allergies:   Morphine; Tessalon [benzonatate]; Hydrocodone; Latex; Antihistamines,  loratadine-type; Codeine; and Tramadol   ROS:  Please see the history of present illness.   All other systems are personally reviewed and negative.    Exam:    Vital Signs:  There were no vitals taken for this visit.        Labs/Other Tests and Data Reviewed:    Recent Labs: 03/11/2023: TSH 1.537 03/18/2023: ALT 15; BUN 10; Creatinine 0.51; Hemoglobin 9.3; Platelet Count 185; Potassium 4.0; Sodium 137   Wt Readings from Last 3 Encounters:  03/18/23 144 lb (65.3 kg)  03/11/23 145 lb 6 oz (65.9 kg)  03/04/23 143 lb (64.9  kg)     Other studies personally reviewed: Additional studies/ records that were reviewed today include: (As above)     ASSESSMENT & PLAN:    Neurally mediated syncope and presyncope   Chest pain atypical   PVCs   Palpitations associated with the above  Breast cancer-right with lymph nodes    Improved palpitations not quite sure why,   Less dizziness.  Able to maintain salt water intake with staying in the chemo  Will be getting an echo prior to her next phase of chemo, this phase of chemo she has tolerated surprisingly well Follow-up:  6 months     Current medicines are reviewed at length with the patient today.   The patient  concerns regarding her medicines.  The following changes were made today:    Labs/ tests ordered today include:  No orders of the defined types were placed in this encounter.     Today, I have spent 11 minutes with the patient with telehealth technology discussing the above.  Signed, Sherryl Manges, MD  03/22/2023 2:32 PM     [facility] [address] [place] [address] [phone] (office) [phone] (fax)

## 2023-03-22 NOTE — Progress Notes (Unsigned)
Symptom Management Consult Note [facility]    Patient Care Team: Georgina Quint, MD as PCP - General (Internal Medicine) Corky Crafts, MD as PCP - Cardiology (Cardiology) Duke Salvia, MD as PCP - Electrophysiology (Cardiology) Serena Croissant, MD as Consulting Physician (Hematology and Oncology) Lonie Peak, MD as Attending Physician (Radiation Oncology) [name], MD as Consulting Physician (General Surgery) Donnelly Angelica, RN as Oncology Nurse Navigator Pershing Proud, RN as Oncology Nurse Navigator    Name / MRN / DOBNefertari Tucker  [id number]  08-29-1981   Date of visit: 03/23/2023   Chief Complaint/Reason for visit: cough and congestion   Current Therapy: Taxol carbo with Rande Lawman every 3 weeks   Last treatment:  Day 8   Cycle 3 on 03/18/23   ASSESSMENT & PLAN: Patient is a 42 y.o. 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 [name].  I have viewed most recent oncology note and lab work.    #Malignant neoplasm of lower-inner quadrant of right breast, estrogen receptor negative  - Next appointment with oncologist is 03/25/23   #URI -Symptoms are consistent with URI.  She has been having low-grade temps at home, afebrile here although took Tylenol prior to arrival. -Lungs are clear on exam. -CBC shows pancytopenia.  Patient received her Zarzio injection while in clinic today, will also prescribe Augmentin to provide bacterial coverage in the setting of pancytopenia -Patient encouraged to try Mucinex and stay well-hydrated.  Discussed neutropenic precautions.  Strict ED precautions discussed should symptoms worsen.    Heme/Onc History: Oncology History  Malignant neoplasm of lower-inner quadrant of right breast of female, estrogen receptor negative (HCC)  01/10/2023 Initial Diagnosis   Palpable lump right axilla x 2 weeks bulky  axillary lymphadenopathy ultrasound breast: 2 masses at 4 o'clock position 1.1 cm and 0.6 cm multiple axillary lymph nodes largest 4.5 cm also level 2 and 3 (subpectoral lymph node as well): Biopsy: Grade 3 IDC ER 0%, PR 0%, Ki-67 95%, HER2 1+   01/26/2023 Genetic Testing   Negative [employer] Custom Panel (37 genes).  Report date is 01/26/2023.    The [employer] Custom Cancers + RNA Panel includes sequencing, deletion/duplication, and RNA analysis of the following 37 genes: APC, ATM, AXIN2, BARD1, BMPR1A, BRCA1, BRCA2, BRIP1, CDH1, CDK4*, CDKN2A (p14ARF)*, CDKN2A (p16INK4a), CHEK2, CTNNA1, DICER1, EPCAM*, FH, GREM1*, HOXB13*, MBD4*, MLH1, MSH2, MSH3, MSH6, MUTYH, NF1, NTHL1, PALB2, PMS2, POLD1, POLE, PTEN, RAD51C, RAD51D, SMAD4, SMARCA4, STK11, TP53.  *Genes without RNA analysis.    01/28/2023 -  Chemotherapy   Patient is on Treatment Plan : BREAST Pembrolizumab (200) D1 + Carboplatin (1.5) D1,8,15 + Paclitaxel (80) D1,8,15 q21d X 4 cycles / Pembrolizumab (200) D1 + AC D1 q21d x 4 cycles         Interval history-: Suzanne Tucker is a 42 y.o. female with oncologic history as above presenting to [facility] today with chief complaint of cough and congestion.  She presents unaccompanied to clinic today.  Patient states prior to her last treatment she had a day of cold symptoms.  She was having a dental procedure and took a one-time dose of amoxicillin.  After her chemotherapy symptoms did not improve.  Patient continues to have productive cough with clear phlegm and congestion.  She reports that her chest is sore when she coughs, otherwise denies any chest pain.  Denies any shortness of breath.  This morning  her temperature was 100.0.  She took a dose of Tylenol around 1130 today.  Her husband and daughter had similar symptoms that have already resolved.  She denies any COVID exposures.  She is hydrating well.      ROS  All other systems are reviewed and are negative for acute change except as noted in the  HPI.    Allergies  Allergen Reactions   Morphine Anaphylaxis and Shortness Of Breath   Tessalon [Benzonatate] Hives    Hives, itching, burning all over.    Hydrocodone Hives   Latex Rash   Antihistamines, Loratadine-Type Other (See Comments)    Patient reports numbness in tongue   Codeine Nausea And Vomiting   Tramadol Other (See Comments)    Tingling in the mouth with swelling      Past Medical History:  Diagnosis Date   Anginal pain (HCC) 02/2019   related to MVP   Back pain    Breast cancer (HCC)    Depression    Dysrhythmia    MVP   Mitral prolapse      Past Surgical History:  Procedure Laterality Date   BREAST SURGERY Right 2024   Breast biopsy x 2   CESAREAN SECTION  03/30/2008   PORTACATH PLACEMENT N/A 01/27/2023   Procedure: INSERTION PORT-A-CATH;  Surgeon: [name], MD;  Location: [facility];  Service: General;  Laterality: N/A;  leaving accessed   ROBOTIC ASSISTED TOTAL HYSTERECTOMY Bilateral 03/15/2019   Procedure: XI ROBOTIC ASSISTED TOTAL HYSTERECTOMY with Bilateral Salpingectomy;  Surgeon: Silverio Lay, MD;  Location: [facility];  Service: Gynecology;  Laterality: Bilateral;   TUBAL LIGATION  2017    Social History   Socioeconomic History   Marital status: Single    Spouse name: Not on file   Number of children: Not on file   Years of education: Not on file   Highest education level: Master's degree (e.g., MA, MS, MEng, MEd, MSW, MBA)  Occupational History   Not on file  Tobacco Use   Smoking status: Never   Smokeless tobacco: Never  Vaping Use   Vaping Use: Never used  Substance and Sexual Activity   Alcohol use: Not Currently    Comment:     Drug use: No   Sexual activity: Yes    Birth control/protection: Surgical  Other Topics Concern   Not on file  Social History Narrative   Not on file   Social Determinants of Health   Financial Resource Strain: Low Risk  (12/27/2022)   Overall Financial Resource Strain  (CARDIA)    Difficulty of Paying Living Expenses: Not very hard  Food Insecurity: No Food Insecurity (12/27/2022)   Hunger Vital Sign    Worried About Running Out of Food in the Last Year: Never true    Ran Out of Food in the Last Year: Never true  Transportation Needs: No Transportation Needs (12/27/2022)   PRAPARE - Administrator, Civil Service (Medical): No    Lack of Transportation (Non-Medical): No  Physical Activity: Insufficiently Active (12/27/2022)   Exercise Vital Sign    Days of Exercise per Week: 4 days    Minutes of Exercise per Session: 20 min  Stress: No Stress Concern Present (12/27/2022)   [employer] of Occupational Health - Occupational Stress Questionnaire    Feeling of Stress : Only a little  Social Connections: Moderately Isolated (12/27/2022)   Social Connection and Isolation Panel [NHANES]    Frequency of Communication with Friends  and Family: More than three times a week    Frequency of Social Gatherings with Friends and Family: Once a week    Attends Religious Services: Never    Database administrator or Organizations: No    Attends Engineer, structural: Not on file    Marital Status: Married  Catering manager Violence: Not on file    Family History  Problem Relation Age of Onset   Stroke Mother    Alzheimer's disease Father    Diabetes Maternal Grandmother    Heart disease Maternal Grandmother    Heart attack Maternal Grandmother    Diabetes Maternal Grandfather    Heart disease Maternal Grandfather    Alzheimer's disease Paternal Grandmother    Dementia Paternal Grandmother    Alzheimer's disease Paternal Grandfather    Dementia Paternal Grandfather    Alcohol abuse Paternal Grandfather      Current Outpatient Medications:    amoxicillin-clavulanate (AUGMENTIN) 875-125 MG tablet, Take 1 tablet by mouth 2 (two) times daily for 7 days., Disp: 14 tablet, Rfl: 0   acetaminophen (TYLENOL) 500 MG tablet, Take 1,000 mg by mouth  in the morning and at bedtime., Disp: , Rfl:    calcium carbonate (TUMS - DOSED IN MG ELEMENTAL CALCIUM) 500 MG chewable tablet, Chew 1,000 mg by mouth as needed for indigestion or heartburn., Disp: , Rfl:    dexamethasone (DECADRON) 4 MG tablet, Take 1 tablet (4 mg total) by mouth daily. Take with Adriamycin 1 tablet day after chemo and 1 tablet 2 days after chemo with food, Disp: 8 tablet, Rfl: 1   fexofenadine (ALLEGRA) 180 MG tablet, Take 180 mg by mouth as needed for allergies or rhinitis., Disp: , Rfl:    fluticasone (FLONASE) 50 MCG/ACT nasal spray, Place 2 sprays into both nostrils daily., Disp: 16 g, Rfl: 0   lidocaine-prilocaine (EMLA) cream, Apply to affected area once, Disp: 30 g, Rfl: 3   ondansetron (ZOFRAN) 8 MG tablet, Take 1 tablet (8 mg total) by mouth every 8 (eight) hours as needed for nausea or vomiting. Start on the third day after chemotherapy., Disp: 30 tablet, Rfl: 1   prochlorperazine (COMPAZINE) 10 MG tablet, Take 1 tablet (10 mg total) by mouth every 6 (six) hours as needed for nausea or vomiting., Disp: 30 tablet, Rfl: 1  PHYSICAL EXAM: ECOG FS:1 - Symptomatic but completely ambulatory    Vitals:   03/23/23 1348  BP: 117/86  Pulse: (!) 102  Resp: 16  Temp: 98.6 F (37 C)  TempSrc: Oral  SpO2: 100%  Weight: 146 lb 6.4 oz (66.4 kg)   Physical Exam Vitals and nursing note reviewed.  Constitutional:      Appearance: She is not ill-appearing or toxic-appearing.  HENT:     Head: Normocephalic.     Comments: No sinus tenderness    Right Ear: Tympanic membrane and external ear normal.     Left Ear: Tympanic membrane and external ear normal.     Nose: Congestion present.     Mouth/Throat:     Mouth: Mucous membranes are moist.     Pharynx: No oropharyngeal exudate or posterior oropharyngeal erythema.  Eyes:     Conjunctiva/sclera: Conjunctivae normal.  Cardiovascular:     Rate and Rhythm: Normal rate and regular rhythm.     Pulses: Normal pulses.      Heart sounds: Normal heart sounds.  Pulmonary:     Effort: Pulmonary effort is normal.     Breath sounds: Normal breath  sounds. No wheezing or rales.  Chest:     Chest wall: No tenderness.  Abdominal:     General: There is no distension.  Musculoskeletal:     Cervical back: Normal range of motion.  Skin:    General: Skin is warm and dry.  Neurological:     Mental Status: She is alert.        LABORATORY DATA: I have reviewed the data as listed    Latest Ref Rng & Units 03/23/2023    2:43 PM 03/18/2023   11:59 AM 03/11/2023   11:22 AM  CBC  WBC 4.0 - 10.5 K/uL 1.6  3.7  2.3   Hemoglobin 12.0 - 15.0 g/dL 09.8  9.3  11.9   Hematocrit 36.0 - 46.0 % 31.5  27.3  30.3   Platelets 150 - 400 K/uL 279  185  165         Latest Ref Rng & Units 03/23/2023    2:43 PM 03/18/2023   11:59 AM 03/11/2023   11:22 AM  CMP  Glucose 70 - 99 mg/dL 147  829  562   BUN 6 - 20 mg/dL 11  10  10    Creatinine 0.44 - 1.00 mg/dL 1.30  8.65  7.84   Sodium 135 - 145 mmol/L 135  137  138   Potassium 3.5 - 5.1 mmol/L 4.0  4.0  3.9   Chloride 98 - 111 mmol/L 100  106  105   CO2 22 - 32 mmol/L 28  26  28    Calcium 8.9 - 10.3 mg/dL 9.3  8.5  8.9   Total Protein 6.5 - 8.1 g/dL 7.2  6.2  7.0   Total Bilirubin 0.3 - 1.2 mg/dL 0.6  0.3  0.4   Alkaline Phos 38 - 126 U/L 68  53  51   AST 15 - 41 U/L 20  17  17    ALT 0 - 44 U/L 20  15  14         RADIOGRAPHIC STUDIES (from last 24 hours if applicable) I have personally reviewed the radiological images as listed and agreed with the findings in the report. No results found.      Visit Diagnosis: 1. Malignant neoplasm of lower-inner quadrant of right breast of female, estrogen receptor negative (HCC)   2. URI, acute   3. Other pancytopenia (HCC)      Orders Placed This Encounter  Procedures   CBC with Differential (Cancer Center Only)    Standing Status:   Future    Number of Occurrences:   1    Standing Expiration Date:   03/22/2024   CMP (Cancer  Center only)    Standing Status:   Future    Number of Occurrences:   1    Standing Expiration Date:   03/22/2024    All questions were answered. The patient knows to call the clinic with any problems, questions or concerns. No barriers to learning was detected.  A total of more than 30 minutes were spent on this encounter with face-to-face time and non-face-to-face time, including preparing to see the patient, ordering tests and/or medications, counseling the patient and coordination of care as outlined above.    Thank you for allowing me to participate in the care of this patient.    Shanon Ace, PA-C Department of Hematology/Oncology [facility] at [facility] Phone: [phone]  [phone]    03/23/2023 4:20 PM

## 2023-03-22 NOTE — Telephone Encounter (Signed)
..   Pt understands that although there may be some limitations with this type of visit, we will take all precautions to reduce any security or privacy concerns.  Pt understands that this will be treated like an in office visit and we will file with pt's insurance, and there may be a patient responsible charge related to this service. ? ?

## 2023-03-23 ENCOUNTER — Other Ambulatory Visit: Payer: Self-pay | Admitting: Hematology and Oncology

## 2023-03-23 ENCOUNTER — Other Ambulatory Visit: Payer: Self-pay

## 2023-03-23 ENCOUNTER — Inpatient Hospital Stay: Payer: BC Managed Care – PPO

## 2023-03-23 ENCOUNTER — Inpatient Hospital Stay (HOSPITAL_BASED_OUTPATIENT_CLINIC_OR_DEPARTMENT_OTHER): Payer: BC Managed Care – PPO | Admitting: Physician Assistant

## 2023-03-23 VITALS — BP 117/86 | HR 102 | Temp 98.6°F | Resp 16 | Wt 146.4 lb

## 2023-03-23 DIAGNOSIS — Z171 Estrogen receptor negative status [ER-]: Secondary | ICD-10-CM

## 2023-03-23 DIAGNOSIS — D61818 Other pancytopenia: Secondary | ICD-10-CM

## 2023-03-23 DIAGNOSIS — J069 Acute upper respiratory infection, unspecified: Secondary | ICD-10-CM

## 2023-03-23 DIAGNOSIS — C50311 Malignant neoplasm of lower-inner quadrant of right female breast: Secondary | ICD-10-CM

## 2023-03-23 LAB — CBC WITH DIFFERENTIAL (CANCER CENTER ONLY)
Abs Immature Granulocytes: 0.01 10*3/uL (ref 0.00–0.07)
Basophils Absolute: 0 10*3/uL (ref 0.0–0.1)
Basophils Relative: 1 %
Eosinophils Absolute: 0 10*3/uL (ref 0.0–0.5)
Eosinophils Relative: 1 %
HCT: 31.5 % — ABNORMAL LOW (ref 36.0–46.0)
Hemoglobin: 10.7 g/dL — ABNORMAL LOW (ref 12.0–15.0)
Immature Granulocytes: 1 %
Lymphocytes Relative: 35 %
Lymphs Abs: 0.6 10*3/uL — ABNORMAL LOW (ref 0.7–4.0)
MCH: 31 pg (ref 26.0–34.0)
MCHC: 34 g/dL (ref 30.0–36.0)
MCV: 91.3 fL (ref 80.0–100.0)
Monocytes Absolute: 0.1 10*3/uL (ref 0.1–1.0)
Monocytes Relative: 6 %
Neutro Abs: 0.9 10*3/uL — ABNORMAL LOW (ref 1.7–7.7)
Neutrophils Relative %: 56 %
Platelet Count: 279 10*3/uL (ref 150–400)
RBC: 3.45 MIL/uL — ABNORMAL LOW (ref 3.87–5.11)
RDW: 14.2 % (ref 11.5–15.5)
WBC Count: 1.6 10*3/uL — ABNORMAL LOW (ref 4.0–10.5)
nRBC: 0 % (ref 0.0–0.2)

## 2023-03-23 LAB — CMP (CANCER CENTER ONLY)
ALT: 20 U/L (ref 0–44)
AST: 20 U/L (ref 15–41)
Albumin: 4.2 g/dL (ref 3.5–5.0)
Alkaline Phosphatase: 68 U/L (ref 38–126)
Anion gap: 7 (ref 5–15)
BUN: 11 mg/dL (ref 6–20)
CO2: 28 mmol/L (ref 22–32)
Calcium: 9.3 mg/dL (ref 8.9–10.3)
Chloride: 100 mmol/L (ref 98–111)
Creatinine: 0.56 mg/dL (ref 0.44–1.00)
GFR, Estimated: >60 mL/min (ref >60)
Glucose, Bld: 103 mg/dL — ABNORMAL HIGH (ref 70–99)
Potassium: 4 mmol/L (ref 3.5–5.1)
Sodium: 135 mmol/L (ref 135–145)
Total Bilirubin: 0.6 mg/dL (ref 0.3–1.2)
Total Protein: 7.2 g/dL (ref 6.5–8.1)

## 2023-03-23 MED ORDER — FILGRASTIM-SNDZ 300 MCG/0.5ML IJ SOSY
300.0000 ug | PREFILLED_SYRINGE | Freq: Once | INTRAMUSCULAR | Status: AC
  Administered 2023-03-23: 300 ug via SUBCUTANEOUS
  Filled 2023-03-23: qty 0.5

## 2023-03-23 MED ORDER — AMOXICILLIN-POT CLAVULANATE 875-125 MG PO TABS: 1.0000 | ORAL_TABLET | Freq: Two times a day (BID) | ORAL | 0 refills | Status: AC

## 2023-03-23 NOTE — Progress Notes (Signed)
Zarxio signed on behalf of Dr [name].  Suzanne Tucker

## 2023-03-24 ENCOUNTER — Other Ambulatory Visit: Payer: Self-pay

## 2023-03-24 MED FILL — Dexamethasone Sodium Phosphate Inj 100 MG/10ML: INTRAMUSCULAR | Qty: 1 | Status: AC

## 2023-03-25 ENCOUNTER — Encounter: Payer: Self-pay | Admitting: Adult Health

## 2023-03-25 ENCOUNTER — Inpatient Hospital Stay: Payer: BC Managed Care – PPO

## 2023-03-25 ENCOUNTER — Inpatient Hospital Stay (HOSPITAL_BASED_OUTPATIENT_CLINIC_OR_DEPARTMENT_OTHER): Payer: BC Managed Care – PPO | Admitting: Adult Health

## 2023-03-25 ENCOUNTER — Other Ambulatory Visit: Payer: Self-pay

## 2023-03-25 VITALS — HR 98

## 2023-03-25 DIAGNOSIS — Z171 Estrogen receptor negative status [ER-]: Secondary | ICD-10-CM | POA: Diagnosis not present

## 2023-03-25 DIAGNOSIS — C50311 Malignant neoplasm of lower-inner quadrant of right female breast: Secondary | ICD-10-CM

## 2023-03-25 DIAGNOSIS — Z95828 Presence of other vascular implants and grafts: Secondary | ICD-10-CM

## 2023-03-25 LAB — CMP (CANCER CENTER ONLY)
ALT: 16 U/L (ref 0–44)
AST: 18 U/L (ref 15–41)
Albumin: 3.9 g/dL (ref 3.5–5.0)
Alkaline Phosphatase: 68 U/L (ref 38–126)
Anion gap: 6 (ref 5–15)
BUN: 8 mg/dL (ref 6–20)
CO2: 27 mmol/L (ref 22–32)
Calcium: 9.2 mg/dL (ref 8.9–10.3)
Chloride: 103 mmol/L (ref 98–111)
Creatinine: 0.65 mg/dL (ref 0.44–1.00)
GFR, Estimated: >60 mL/min (ref >60)
Glucose, Bld: 128 mg/dL — ABNORMAL HIGH (ref 70–99)
Potassium: 3.7 mmol/L (ref 3.5–5.1)
Sodium: 136 mmol/L (ref 135–145)
Total Bilirubin: 0.4 mg/dL (ref 0.3–1.2)
Total Protein: 6.7 g/dL (ref 6.5–8.1)

## 2023-03-25 LAB — CBC WITH DIFFERENTIAL (CANCER CENTER ONLY)
Abs Immature Granulocytes: 0.29 10*3/uL — ABNORMAL HIGH (ref 0.00–0.07)
Basophils Absolute: 0 10*3/uL (ref 0.0–0.1)
Basophils Relative: 0 %
Eosinophils Absolute: 0 10*3/uL (ref 0.0–0.5)
Eosinophils Relative: 0 %
HCT: 28.5 % — ABNORMAL LOW (ref 36.0–46.0)
Hemoglobin: 9.8 g/dL — ABNORMAL LOW (ref 12.0–15.0)
Immature Granulocytes: 6 %
Lymphocytes Relative: 19 %
Lymphs Abs: 0.9 10*3/uL (ref 0.7–4.0)
MCH: 31.4 pg (ref 26.0–34.0)
MCHC: 34.4 g/dL (ref 30.0–36.0)
MCV: 91.3 fL (ref 80.0–100.0)
Monocytes Absolute: 0.3 10*3/uL (ref 0.1–1.0)
Monocytes Relative: 6 %
Neutro Abs: 3.1 10*3/uL (ref 1.7–7.7)
Neutrophils Relative %: 69 %
Platelet Count: 244 10*3/uL (ref 150–400)
RBC: 3.12 MIL/uL — ABNORMAL LOW (ref 3.87–5.11)
RDW: 14.7 % (ref 11.5–15.5)
Smear Review: NORMAL
WBC Count: 4.5 10*3/uL (ref 4.0–10.5)
nRBC: 0 % (ref 0.0–0.2)

## 2023-03-25 MED ORDER — PALONOSETRON HCL INJECTION 0.25 MG/5ML
0.2500 mg | Freq: Once | INTRAVENOUS | Status: AC
  Administered 2023-03-25: 0.25 mg via INTRAVENOUS
  Filled 2023-03-25: qty 5

## 2023-03-25 MED ORDER — FAMOTIDINE IN NACL 20-0.9 MG/50ML-% IV SOLN
20.0000 mg | Freq: Once | INTRAVENOUS | Status: AC
  Administered 2023-03-25: 20 mg via INTRAVENOUS
  Filled 2023-03-25: qty 50

## 2023-03-25 MED ORDER — DIPHENHYDRAMINE HCL 50 MG/ML IJ SOLN
25.0000 mg | Freq: Once | INTRAMUSCULAR | Status: AC
  Administered 2023-03-25: 25 mg via INTRAVENOUS
  Filled 2023-03-25: qty 1

## 2023-03-25 MED ORDER — HEPARIN SOD (PORK) LOCK FLUSH 100 UNIT/ML IV SOLN
500.0000 [IU] | Freq: Once | INTRAVENOUS | Status: AC | PRN
  Administered 2023-03-25: 500 [IU]

## 2023-03-25 MED ORDER — SODIUM CHLORIDE 0.9% FLUSH
10.0000 mL | INTRAVENOUS | Status: DC | PRN
  Administered 2023-03-25: 10 mL

## 2023-03-25 MED ORDER — SODIUM CHLORIDE 0.9 % IV SOLN
10.0000 mg | Freq: Once | INTRAVENOUS | Status: AC
  Administered 2023-03-25: 10 mg via INTRAVENOUS
  Filled 2023-03-25: qty 10

## 2023-03-25 MED ORDER — SODIUM CHLORIDE 0.9 % IV SOLN
80.0000 mg/m2 | Freq: Once | INTRAVENOUS | Status: AC
  Administered 2023-03-25: 138 mg via INTRAVENOUS
  Filled 2023-03-25: qty 23

## 2023-03-25 MED ORDER — SODIUM CHLORIDE 0.9% FLUSH
10.0000 mL | Freq: Once | INTRAVENOUS | Status: AC
  Administered 2023-03-25: 10 mL

## 2023-03-25 MED ORDER — SODIUM CHLORIDE 0.9 % IV SOLN: Freq: Once | INTRAVENOUS | Status: AC

## 2023-03-25 MED ORDER — SODIUM CHLORIDE 0.9 % IV SOLN
187.2000 mg | Freq: Once | INTRAVENOUS | Status: AC
  Administered 2023-03-25: 190 mg via INTRAVENOUS
  Filled 2023-03-25: qty 19

## 2023-03-25 NOTE — Assessment & Plan Note (Addendum)
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  She is tolerating treatment moderately well.  I reviewed her labs with her today which remained stable.  We had a long conversation about how the chemotherapy works how often she will receive it.  Her URI has resolved.  She will proceed with her treatment today and knows to call if she develops any concerns between now and her next visit with [place].

## 2023-03-25 NOTE — Research (Signed)
TRIAL S2205, ICE COMPRESS: RANDOMIZED TRIAL OF LIMB CRYOCOMPRESSION VERSUS CONTINUOUS COMPRESSION VERSUS LOW CYCLIC COMPRESSION FOR THE PREVENTION  OF TAXANE-INDUCED PERIPHERAL NEUROPATHY   Patient arrives today Accompanied by her friend [name]  for the Week 9 visit. Confirmed no wounds, sores or lesions to extremities.  1405 Wraps applied; pre cooling started. 1438 Taxane started; switched to treatment phase. 1452 Bathroom break. 1501 Restart after BR; will need to make up 9 minutes. 1537 Taxane done. 1547 Make up time done; switched to post-cooling. 1617 Therapy done; wraps removed. No wounds, sores, or lesions to extremities.   MD/PROVIDER VISIT: Patient sees NP Mardella Layman for today's visit.   ADVERSE EVENTS: [name] reports no AEs.  ADVERSE EVENT LOGNatalyah Cummiskey [phone]  03/25/2023  Adverse Event Log    Event Grade Onset Date Resolved Date Drug Name Attribution Treatment Comments                                                           DISPOSITION: Upon completion off all study requirements, patient remained in infusion to finish her flush and have port de-accessed.   The patient was thanked for their time and continued voluntary participation in this study. [name] has been provided direct contact information and is encouraged to contact this Nurse for any needs or questions.  Margret Chance Vincentina Sollers, RN, BSN, [facility] She  Her  Hers Clinical Research Nurse [facility] Direct Dial [phone]  Pager [phone] 03/25/2023 4:32 PM

## 2023-03-25 NOTE — Patient Instructions (Signed)
[facility]  Discharge Instructions: Thank you for choosing [facility] to provide your oncology and hematology care.   If you have a lab appointment with the Cancer Center, please go directly to the Cancer Center and check in at the registration area.   Wear comfortable clothing and clothing appropriate for easy access to any Portacath or PICC line.   We strive to give you quality time with your provider. You may need to reschedule your appointment if you arrive late (15 or more minutes).  Arriving late affects you and other patients whose appointments are after yours.  Also, if you miss three or more appointments without notifying the office, you may be dismissed from the clinic at the provider's discretion.      For prescription refill requests, have your pharmacy contact our office and allow 72 hours for refills to be completed.    Today you received the following chemotherapy and/or immunotherapy agents: Paclitaxel, Carboplatin.       To help prevent nausea and vomiting after your treatment, we encourage you to take your nausea medication as directed.  BELOW ARE SYMPTOMS THAT SHOULD BE REPORTED IMMEDIATELY: *FEVER GREATER THAN 100.4 F (38 C) OR HIGHER *CHILLS OR SWEATING *NAUSEA AND VOMITING THAT IS NOT CONTROLLED WITH YOUR NAUSEA MEDICATION *UNUSUAL SHORTNESS OF BREATH *UNUSUAL BRUISING OR BLEEDING *URINARY PROBLEMS (pain or burning when urinating, or frequent urination) *BOWEL PROBLEMS (unusual diarrhea, constipation, pain near the anus) TENDERNESS IN MOUTH AND THROAT WITH OR WITHOUT PRESENCE OF ULCERS (sore throat, sores in mouth, or a toothache) UNUSUAL RASH, SWELLING OR PAIN  UNUSUAL VAGINAL DISCHARGE OR ITCHING   Items with * indicate a potential emergency and should be followed up as soon as possible or go to the Emergency Department if any problems should occur.  Please show the CHEMOTHERAPY ALERT CARD or IMMUNOTHERAPY  ALERT CARD at check-in to the Emergency Department and triage nurse.  Should you have questions after your visit or need to cancel or reschedule your appointment, please contact [facility]  Dept: [phone]  and follow the prompts.  Office hours are 8:00 a.m. to 4:30 p.m. Monday - Friday. Please note that voicemails left after 4:00 p.m. may not be returned until the following business day.  We are closed weekends and major holidays. You have access to a nurse at all times for urgent questions. Please call the main number to the clinic Dept: [phone] and follow the prompts.   For any non-urgent questions, you may also contact your provider using MyChart. We now offer e-Visits for anyone 18 and older to request care online for non-urgent symptoms. For details visit [email or web address].   Also download the MyChart app! Go to the app store, search "MyChart", open the app, select [facility], and log in with your MyChart username and password.   

## 2023-03-25 NOTE — Progress Notes (Signed)
[facility] Cancer Follow up:    Suzanne Quint, MD [address] [place] [address]   DIAGNOSIS:  Cancer Staging  Malignant neoplasm of lower-inner quadrant of right breast of female, estrogen receptor negative (HCC) Staging form: Breast, AJCC 8th Edition - Clinical: Stage IIIC (cT2, cN3c, cM0, G3, ER-, PR-, HER2-) - Unsigned Histologic grading system: 3 grade system   SUMMARY OF ONCOLOGIC HISTORY: Oncology History  Malignant neoplasm of lower-inner quadrant of right breast of female, estrogen receptor negative (HCC)  01/10/2023 Initial Diagnosis   Palpable lump right axilla x 2 weeks bulky axillary lymphadenopathy ultrasound breast: 2 masses at 4 o'clock position 1.1 cm and 0.6 cm multiple axillary lymph nodes largest 4.5 cm also level 2 and 3 (subpectoral lymph node as well): Biopsy: Grade 3 IDC ER 0%, PR 0%, Ki-67 95%, HER2 1+   01/26/2023 Genetic Testing   Negative [employer] Custom Panel (37 genes).  Report date is 01/26/2023.    The [employer] Custom Cancers + RNA Panel includes sequencing, deletion/duplication, and RNA analysis of the following 37 genes: APC, ATM, AXIN2, BARD1, BMPR1A, BRCA1, BRCA2, BRIP1, CDH1, CDK4*, CDKN2A (p14ARF)*, CDKN2A (p16INK4a), CHEK2, CTNNA1, DICER1, EPCAM*, FH, GREM1*, HOXB13*, MBD4*, MLH1, MSH2, MSH3, MSH6, MUTYH, NF1, NTHL1, PALB2, PMS2, POLD1, POLE, PTEN, RAD51C, RAD51D, SMAD4, SMARCA4, STK11, TP53.  *Genes without RNA analysis.    01/28/2023 -  Chemotherapy   Patient is on Treatment Plan : BREAST Pembrolizumab (200) D1 + Carboplatin (1.5) D1,8,15 + Paclitaxel (80) D1,8,15 q21d X 4 cycles / Pembrolizumab (200) D1 + AC D1 q21d x 4 cycles       CURRENT THERAPY: Taxol/Carbo/Keytruda  INTERVAL HISTORY: Suzanne Tucker 42 y.o. female returns for follow-up prior to receiving Taxol [place].  She was sick last week with upper respiratory infection and took Augmentin.  Since that time she has improved.  She denies any peripheral  neuropathy.  She has intermittent constipation fluctuating with diarrhea that she is managing moderately well.   Patient Active Problem List   Diagnosis Date Noted   Genetic testing 02/08/2023   Port-A-Cath in place 01/28/2023   Malignant neoplasm of lower-inner quadrant of right breast of female, estrogen receptor negative (HCC) 01/17/2023   Axillary mass, right 12/28/2022   Palpitations 11/23/2022   Dizzy spells 03/04/2022   Carpal tunnel syndrome on right 11/02/2017   MVP (mitral valve prolapse) 10/28/2017    is allergic to morphine; tessalon [benzonatate]; hydrocodone; latex; antihistamines, loratadine-type; codeine; and tramadol.  MEDICAL HISTORY: Past Medical History:  Diagnosis Date   Anginal pain (HCC) 02/2019   related to MVP   Back pain    Breast cancer (HCC)    Depression    Dysrhythmia    MVP   Mitral prolapse     SURGICAL HISTORY: Past Surgical History:  Procedure Laterality Date   BREAST SURGERY Right 2024   Breast biopsy x 2   CESAREAN SECTION  03/30/2008   PORTACATH PLACEMENT N/A 01/27/2023   Procedure: INSERTION PORT-A-CATH;  Surgeon: [name], MD;  Location: [facility];  Service: General;  Laterality: N/A;  leaving accessed   ROBOTIC ASSISTED TOTAL HYSTERECTOMY Bilateral 03/15/2019   Procedure: XI ROBOTIC ASSISTED TOTAL HYSTERECTOMY with Bilateral Salpingectomy;  Surgeon: Silverio Lay, MD;  Location:  SURGERY CENTER;  Service: Gynecology;  Laterality: Bilateral;   TUBAL LIGATION  2017    SOCIAL HISTORY: Social History   Socioeconomic History   Marital status: Single    Spouse name: Not on file   Number  of children: Not on file   Years of education: Not on file   Highest education level: Master's degree (e.g., MA, MS, MEng, MEd, MSW, MBA)  Occupational History   Not on file  Tobacco Use   Smoking status: Never   Smokeless tobacco: Never  Vaping Use   Vaping Use: Never used  Substance and Sexual Activity   Alcohol use: Not  Currently    Comment:     Drug use: No   Sexual activity: Yes    Birth control/protection: Surgical  Other Topics Concern   Not on file  Social History Narrative   Not on file   Social Determinants of Health   Financial Resource Strain: Low Risk  (12/27/2022)   Overall Financial Resource Strain (CARDIA)    Difficulty of Paying Living Expenses: Not very hard  Food Insecurity: No Food Insecurity (12/27/2022)   Hunger Vital Sign    Worried About Running Out of Food in the Last Year: Never true    Ran Out of Food in the Last Year: Never true  Transportation Needs: No Transportation Needs (12/27/2022)   PRAPARE - Administrator, Civil Service (Medical): No    Lack of Transportation (Non-Medical): No  Physical Activity: Insufficiently Active (12/27/2022)   Exercise Vital Sign    Days of Exercise per Week: 4 days    Minutes of Exercise per Session: 20 min  Stress: No Stress Concern Present (12/27/2022)   [employer] of Occupational Health - Occupational Stress Questionnaire    Feeling of Stress : Only a little  Social Connections: Moderately Isolated (12/27/2022)   Social Connection and Isolation Panel [NHANES]    Frequency of Communication with Friends and Family: More than three times a week    Frequency of Social Gatherings with Friends and Family: Once a week    Attends Religious Services: Never    Database administrator or Organizations: No    Attends Engineer, structural: Not on file    Marital Status: Married  Catering manager Violence: Not on file    FAMILY HISTORY: Family History  Problem Relation Age of Onset   Stroke Mother    Alzheimer's disease Father    Diabetes Maternal Grandmother    Heart disease Maternal Grandmother    Heart attack Maternal Grandmother    Diabetes Maternal Grandfather    Heart disease Maternal Grandfather    Alzheimer's disease Paternal Grandmother    Dementia Paternal Grandmother    Alzheimer's disease Paternal  Grandfather    Dementia Paternal Grandfather    Alcohol abuse Paternal Grandfather     Review of Systems  Constitutional:  Positive for fatigue. Negative for appetite change, chills, fever and unexpected weight change.  HENT:   Negative for hearing loss, lump/mass and trouble swallowing.   Eyes:  Negative for eye problems and icterus.  Respiratory:  Negative for chest tightness, cough and shortness of breath.   Cardiovascular:  Negative for chest pain, leg swelling and palpitations.  Gastrointestinal:  Positive for constipation and diarrhea. Negative for abdominal distention, abdominal pain, nausea and vomiting.  Endocrine: Negative for hot flashes.  Genitourinary:  Negative for difficulty urinating.   Musculoskeletal:  Negative for arthralgias.  Skin:  Negative for itching and rash.  Neurological:  Negative for dizziness, extremity weakness, headaches and numbness.  Hematological:  Negative for adenopathy. Does not bruise/bleed easily.  Psychiatric/Behavioral:  Negative for depression. The patient is not nervous/anxious.       PHYSICAL EXAMINATION  Vitals:   03/25/23 1208  BP: 118/85  Pulse: (!) 104  Resp: 16  Temp: 97.9 F (36.6 C)  SpO2: 100%    Physical Exam Constitutional:      General: She is not in acute distress.    Appearance: Normal appearance. She is not toxic-appearing.  HENT:     Head: Normocephalic and atraumatic.     Mouth/Throat:     Mouth: Mucous membranes are moist.     Pharynx: Oropharynx is clear. No oropharyngeal exudate or posterior oropharyngeal erythema.  Eyes:     General: No scleral icterus. Cardiovascular:     Rate and Rhythm: Normal rate and regular rhythm.     Pulses: Normal pulses.     Heart sounds: Normal heart sounds.  Pulmonary:     Effort: Pulmonary effort is normal.     Breath sounds: Normal breath sounds.  Abdominal:     General: Abdomen is flat. Bowel sounds are normal. There is no distension.     Palpations: Abdomen is  soft.     Tenderness: There is no abdominal tenderness.  Musculoskeletal:        General: No swelling.     Cervical back: Neck supple.  Lymphadenopathy:     Cervical: No cervical adenopathy.  Skin:    General: Skin is warm and dry.     Findings: No rash.  Neurological:     General: No focal deficit present.     Mental Status: She is alert.  Psychiatric:        Mood and Affect: Mood normal.        Behavior: Behavior normal.     LABORATORY DATA:  CBC    Component Value Date/Time   WBC 4.5 03/25/2023 1148   WBC 7.3 01/30/2023 1625   RBC 3.12 (L) 03/25/2023 1148   HGB 9.8 (L) 03/25/2023 1148   HGB 11.6 04/08/2022 1037   HCT 28.5 (L) 03/25/2023 1148   HCT 35.8 04/08/2022 1037   PLT 244 03/25/2023 1148   PLT 244 04/08/2022 1037   MCV 91.3 03/25/2023 1148   MCV 89 04/08/2022 1037   MCH 31.4 03/25/2023 1148   MCHC 34.4 03/25/2023 1148   RDW 14.7 03/25/2023 1148   RDW 12.1 04/08/2022 1037   LYMPHSABS 0.9 03/25/2023 1148   LYMPHSABS 1.3 04/08/2022 1037   MONOABS 0.3 03/25/2023 1148   EOSABS 0.0 03/25/2023 1148   EOSABS 0.1 04/08/2022 1037   BASOSABS 0.0 03/25/2023 1148   BASOSABS 0.0 04/08/2022 1037    CMP     Component Value Date/Time   NA 136 03/25/2023 1148   NA 140 04/08/2022 1037   K 3.7 03/25/2023 1148   CL 103 03/25/2023 1148   CO2 27 03/25/2023 1148   GLUCOSE 128 (H) 03/25/2023 1148   BUN 8 03/25/2023 1148   BUN 9 04/08/2022 1037   CREATININE 0.65 03/25/2023 1148   CALCIUM 9.2 03/25/2023 1148   PROT 6.7 03/25/2023 1148   PROT 6.5 04/08/2022 1037   ALBUMIN 3.9 03/25/2023 1148   ALBUMIN 4.3 04/08/2022 1037   AST 18 03/25/2023 1148   ALT 16 03/25/2023 1148   ALKPHOS 68 03/25/2023 1148   BILITOT 0.4 03/25/2023 1148   GFRNONAA >60 03/25/2023 1148   GFRAA 132 11/04/2020 1208       ASSESSMENT and THERAPY PLAN:   Malignant neoplasm of lower-inner quadrant of right breast of female, estrogen receptor negative (HCC) Suzanne Tucker is a 42 year old woman  with 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  She is tolerating treatment moderately well.  I reviewed her labs with her today which remained stable.  We had a long conversation about how the chemotherapy works how often she will receive it.  Her URI has resolved.  She will proceed with her treatment today and knows to call if she develops any concerns between now and her next visit with [place].  All questions were answered. The patient knows to call the clinic with any problems, questions or concerns. We can certainly see the patient much sooner if necessary.  Total encounter time:30 minutes*in face-to-face visit time, chart review, lab review, care coordination, order entry, and documentation of the encounter time.    Lillard Anes, NP 03/25/23 1:21 PM Medical Oncology and Hematology [facility] [address] [place], [place] [address] Tel. [phone]    Fax. [phone]  *Total Encounter Time as defined by the Centers for Medicare and Medicaid Services includes, in addition to the face-to-face time of a patient visit (documented in the note above) non-face-to-face time: obtaining and reviewing outside history, ordering and reviewing medications, tests or procedures, care coordination (communications with other health care professionals or caregivers) and documentation in the medical record.

## 2023-03-28 DIAGNOSIS — Z79620 Long term (current) use of immunosuppressive biologic: Secondary | ICD-10-CM | POA: Diagnosis not present

## 2023-03-28 DIAGNOSIS — Z5111 Encounter for antineoplastic chemotherapy: Secondary | ICD-10-CM | POA: Diagnosis present

## 2023-03-28 DIAGNOSIS — C50311 Malignant neoplasm of lower-inner quadrant of right female breast: Secondary | ICD-10-CM | POA: Diagnosis present

## 2023-03-28 DIAGNOSIS — Z79899 Other long term (current) drug therapy: Secondary | ICD-10-CM | POA: Diagnosis not present

## 2023-03-28 DIAGNOSIS — Z171 Estrogen receptor negative status [ER-]: Secondary | ICD-10-CM | POA: Diagnosis not present

## 2023-03-30 ENCOUNTER — Inpatient Hospital Stay: Payer: BC Managed Care – PPO

## 2023-03-31 ENCOUNTER — Inpatient Hospital Stay: Payer: BC Managed Care – PPO

## 2023-03-31 ENCOUNTER — Other Ambulatory Visit: Payer: Self-pay

## 2023-03-31 ENCOUNTER — Other Ambulatory Visit: Payer: Self-pay | Admitting: Hematology and Oncology

## 2023-03-31 ENCOUNTER — Encounter: Payer: Self-pay | Admitting: Internal Medicine

## 2023-03-31 DIAGNOSIS — C50311 Malignant neoplasm of lower-inner quadrant of right female breast: Secondary | ICD-10-CM | POA: Diagnosis not present

## 2023-03-31 DIAGNOSIS — Z171 Estrogen receptor negative status [ER-]: Secondary | ICD-10-CM

## 2023-03-31 MED ORDER — FILGRASTIM-SNDZ 300 MCG/0.5ML IJ SOSY
300.0000 ug | PREFILLED_SYRINGE | Freq: Once | INTRAMUSCULAR | Status: AC
  Administered 2023-03-31: 300 ug via SUBCUTANEOUS
  Filled 2023-03-31: qty 0.5

## 2023-03-31 MED FILL — Dexamethasone Sodium Phosphate Inj 100 MG/10ML: INTRAMUSCULAR | Qty: 1 | Status: AC

## 2023-04-01 ENCOUNTER — Other Ambulatory Visit: Payer: Self-pay

## 2023-04-01 ENCOUNTER — Inpatient Hospital Stay: Payer: BC Managed Care – PPO

## 2023-04-01 VITALS — BP 118/83 | HR 97 | Temp 98.1°F | Resp 18 | Wt 145.8 lb

## 2023-04-01 DIAGNOSIS — Z171 Estrogen receptor negative status [ER-]: Secondary | ICD-10-CM

## 2023-04-01 DIAGNOSIS — C50311 Malignant neoplasm of lower-inner quadrant of right female breast: Secondary | ICD-10-CM

## 2023-04-01 LAB — CBC WITH DIFFERENTIAL (CANCER CENTER ONLY)
Abs Immature Granulocytes: 0.11 10*3/uL — ABNORMAL HIGH (ref 0.00–0.07)
Basophils Absolute: 0.1 10*3/uL (ref 0.0–0.1)
Basophils Relative: 1 %
Eosinophils Absolute: 0 10*3/uL (ref 0.0–0.5)
Eosinophils Relative: 1 %
HCT: 28.9 % — ABNORMAL LOW (ref 36.0–46.0)
Hemoglobin: 9.9 g/dL — ABNORMAL LOW (ref 12.0–15.0)
Immature Granulocytes: 2 %
Lymphocytes Relative: 14 %
Lymphs Abs: 1.1 10*3/uL (ref 0.7–4.0)
MCH: 31.4 pg (ref 26.0–34.0)
MCHC: 34.3 g/dL (ref 30.0–36.0)
MCV: 91.7 fL (ref 80.0–100.0)
Monocytes Absolute: 0.6 10*3/uL (ref 0.1–1.0)
Monocytes Relative: 8 %
Neutro Abs: 5.7 10*3/uL (ref 1.7–7.7)
Neutrophils Relative %: 74 %
Platelet Count: 275 10*3/uL (ref 150–400)
RBC: 3.15 MIL/uL — ABNORMAL LOW (ref 3.87–5.11)
RDW: 15.5 % (ref 11.5–15.5)
Smear Review: NORMAL
WBC Count: 7.6 10*3/uL (ref 4.0–10.5)
nRBC: 0 % (ref 0.0–0.2)

## 2023-04-01 LAB — CMP (CANCER CENTER ONLY)
ALT: 13 U/L (ref 0–44)
AST: 17 U/L (ref 15–41)
Albumin: 3.9 g/dL (ref 3.5–5.0)
Alkaline Phosphatase: 63 U/L (ref 38–126)
Anion gap: 7 (ref 5–15)
BUN: 7 mg/dL (ref 6–20)
CO2: 26 mmol/L (ref 22–32)
Calcium: 9.3 mg/dL (ref 8.9–10.3)
Chloride: 105 mmol/L (ref 98–111)
Creatinine: 0.61 mg/dL (ref 0.44–1.00)
GFR, Estimated: >60 mL/min (ref >60)
Glucose, Bld: 90 mg/dL (ref 70–99)
Potassium: 3.7 mmol/L (ref 3.5–5.1)
Sodium: 138 mmol/L (ref 135–145)
Total Bilirubin: 0.4 mg/dL (ref 0.3–1.2)
Total Protein: 7 g/dL (ref 6.5–8.1)

## 2023-04-01 MED ORDER — SODIUM CHLORIDE 0.9 % IV SOLN
187.2000 mg | Freq: Once | INTRAVENOUS | Status: AC
  Administered 2023-04-01: 190 mg via INTRAVENOUS
  Filled 2023-04-01: qty 19

## 2023-04-01 MED ORDER — SODIUM CHLORIDE 0.9 % IV SOLN: Freq: Once | INTRAVENOUS | Status: AC

## 2023-04-01 MED ORDER — SODIUM CHLORIDE 0.9 % IV SOLN
200.0000 mg | Freq: Once | INTRAVENOUS | Status: AC
  Administered 2023-04-01: 200 mg via INTRAVENOUS
  Filled 2023-04-01: qty 200

## 2023-04-01 MED ORDER — FAMOTIDINE IN NACL 20-0.9 MG/50ML-% IV SOLN
20.0000 mg | Freq: Once | INTRAVENOUS | Status: AC
  Administered 2023-04-01: 20 mg via INTRAVENOUS
  Filled 2023-04-01: qty 50

## 2023-04-01 MED ORDER — SODIUM CHLORIDE 0.9% FLUSH
10.0000 mL | INTRAVENOUS | Status: DC | PRN
  Administered 2023-04-01: 10 mL

## 2023-04-01 MED ORDER — HEPARIN SOD (PORK) LOCK FLUSH 100 UNIT/ML IV SOLN
500.0000 [IU] | Freq: Once | INTRAVENOUS | Status: AC | PRN
  Administered 2023-04-01: 500 [IU]

## 2023-04-01 MED ORDER — SODIUM CHLORIDE 0.9 % IV SOLN
80.0000 mg/m2 | Freq: Once | INTRAVENOUS | Status: AC
  Administered 2023-04-01: 138 mg via INTRAVENOUS
  Filled 2023-04-01: qty 23

## 2023-04-01 MED ORDER — PALONOSETRON HCL INJECTION 0.25 MG/5ML
0.2500 mg | Freq: Once | INTRAVENOUS | Status: AC
  Administered 2023-04-01: 0.25 mg via INTRAVENOUS
  Filled 2023-04-01: qty 5

## 2023-04-01 MED ORDER — SODIUM CHLORIDE 0.9 % IV SOLN
10.0000 mg | Freq: Once | INTRAVENOUS | Status: AC
  Administered 2023-04-01: 10 mg via INTRAVENOUS
  Filled 2023-04-01: qty 10

## 2023-04-01 MED ORDER — DIPHENHYDRAMINE HCL 50 MG/ML IJ SOLN
25.0000 mg | Freq: Once | INTRAMUSCULAR | Status: AC
  Administered 2023-04-01: 25 mg via INTRAVENOUS
  Filled 2023-04-01: qty 1

## 2023-04-01 NOTE — Research (Addendum)
TRIAL S2205, ICE COMPRESS: RANDOMIZED TRIAL OF LIMB CRYOCOMPRESSION VERSUS CONTINUOUS COMPRESSION VERSUS LOW CYCLIC COMPRESSION FOR THE PREVENTION  OF TAXANE-INDUCED PERIPHERAL NEUROPATHY    Patient arrives today Accompanied by her friend [name]  for the Week 10 visit. No wounds, sore, or lesions to extremities.  1411 Wraps applied; pre-treatment started. 1456 Taxane started; moved to treatment phase. 1458 Bathroom break. 1505 Restart after interruption. 1525 Restarted upper machine d/t malfunction. 1530 Changed out upper machine d/t continuing malfunction. Patient reports no discomfort. 1557 Taxane done; patient needs to make up 7 minutes for bathroom break. 1605 Moved to post-treatment phase. 1635 Post-cooling done; wraps removed. No wounds, sores, lesions to extremities. RN Durene Cal at bedside and calling Georga Kaufmann to see pt due to cough.    ADVERSE EVENTS: Patient Alithia Hilley reports AE as below. Attribution per Dr [name].  ADVERSE EVENT LOGShanitha Bartnik [id number]  04/01/2023  Adverse Event Log   Event Grade Onset Date Resolved Date Drug Name Attribution Treatment Comments  Cough Grade 2 03/25/23   Unrelated  Pt was recently treated for URI and has newly returned home to her dogs, to whom she is allergic                                                 GIFT CARD: This study does not provide visit compensation.   DISPOSITION: Upon completion off all study requirements, patient remained in infusion to complete her treatment.   The patient was thanked for their time and continued voluntary participation in this study. Patient Lakieta Makey has been provided direct contact information and is encouraged to contact this Nurse for any needs or questions.  Margret Chance Terianne Thaker, RN, BSN, [facility] She  Her  Hers Clinical Research Nurse [facility] Direct Dial [phone]  Pager [phone] 04/04/2023 1:04 PM   ADDENDUM TO CLARIFY BREAKS: 1525 Rebooted  upper machine d/t malfunction; colleague is bringing back up machine so it can be switched out. 1530 Prepared back up machine arrived; it was already cooled prior to bringing it to infusion room. Hoses were switched to new machine with less than one minute break. New machine at 11 degrees celcius and moving between 5-15 mmHg.  Margret Chance Neha Waight, RN, BSN, [facility] She  Her  Hers Clinical Research Nurse [facility] Direct Dial [phone]  Pager [phone] 06/16/2023 1:22 PM

## 2023-04-01 NOTE — Patient Instructions (Signed)
[facility]  Discharge Instructions: Thank you for choosing [facility] to provide your oncology and hematology care.   If you have a lab appointment with the Cancer Center, please go directly to the Cancer Center and check in at the registration area.   Wear comfortable clothing and clothing appropriate for easy access to any Portacath or PICC line.   We strive to give you quality time with your provider. You may need to reschedule your appointment if you arrive late (15 or more minutes).  Arriving late affects you and other patients whose appointments are after yours.  Also, if you miss three or more appointments without notifying the office, you may be dismissed from the clinic at the provider's discretion.      For prescription refill requests, have your pharmacy contact our office and allow 72 hours for refills to be completed.    Today you received the following chemotherapy and/or immunotherapy agents Keytruda, Taxol, Carbo.      To help prevent nausea and vomiting after your treatment, we encourage you to take your nausea medication as directed.  BELOW ARE SYMPTOMS THAT SHOULD BE REPORTED IMMEDIATELY: *FEVER GREATER THAN 100.4 F (38 C) OR HIGHER *CHILLS OR SWEATING *NAUSEA AND VOMITING THAT IS NOT CONTROLLED WITH YOUR NAUSEA MEDICATION *UNUSUAL SHORTNESS OF BREATH *UNUSUAL BRUISING OR BLEEDING *URINARY PROBLEMS (pain or burning when urinating, or frequent urination) *BOWEL PROBLEMS (unusual diarrhea, constipation, pain near the anus) TENDERNESS IN MOUTH AND THROAT WITH OR WITHOUT PRESENCE OF ULCERS (sore throat, sores in mouth, or a toothache) UNUSUAL RASH, SWELLING OR PAIN  UNUSUAL VAGINAL DISCHARGE OR ITCHING   Items with * indicate a potential emergency and should be followed up as soon as possible or go to the Emergency Department if any problems should occur.  Please show the CHEMOTHERAPY ALERT CARD or IMMUNOTHERAPY ALERT  CARD at check-in to the Emergency Department and triage nurse.  Should you have questions after your visit or need to cancel or reschedule your appointment, please contact [facility]  Dept: [phone]  and follow the prompts.  Office hours are 8:00 a.m. to 4:30 p.m. Monday - Friday. Please note that voicemails left after 4:00 p.m. may not be returned until the following business day.  We are closed weekends and major holidays. You have access to a nurse at all times for urgent questions. Please call the main number to the clinic Dept: [phone] and follow the prompts.   For any non-urgent questions, you may also contact your provider using MyChart. We now offer e-Visits for anyone 59 and older to request care online for non-urgent symptoms. For details visit [email or web address].   Also download the MyChart app! Go to the app store, search "MyChart", open the app, select [place], and log in with your MyChart username and password.

## 2023-04-03 ENCOUNTER — Encounter: Payer: Self-pay | Admitting: Hematology and Oncology

## 2023-04-04 ENCOUNTER — Encounter: Payer: Self-pay | Admitting: Hematology and Oncology

## 2023-04-06 ENCOUNTER — Inpatient Hospital Stay: Payer: Managed Care, Other (non HMO) | Attending: Hematology and Oncology

## 2023-04-06 ENCOUNTER — Other Ambulatory Visit: Payer: Self-pay

## 2023-04-06 ENCOUNTER — Telehealth: Payer: Self-pay | Admitting: Hematology and Oncology

## 2023-04-06 VITALS — BP 125/87 | HR 89 | Temp 98.7°F | Resp 18

## 2023-04-06 DIAGNOSIS — C50311 Malignant neoplasm of lower-inner quadrant of right female breast: Secondary | ICD-10-CM | POA: Diagnosis present

## 2023-04-06 DIAGNOSIS — Z5111 Encounter for antineoplastic chemotherapy: Secondary | ICD-10-CM | POA: Diagnosis present

## 2023-04-06 DIAGNOSIS — D72819 Decreased white blood cell count, unspecified: Secondary | ICD-10-CM | POA: Diagnosis not present

## 2023-04-06 DIAGNOSIS — Z79899 Other long term (current) drug therapy: Secondary | ICD-10-CM | POA: Insufficient documentation

## 2023-04-06 DIAGNOSIS — Z171 Estrogen receptor negative status [ER-]: Secondary | ICD-10-CM | POA: Diagnosis not present

## 2023-04-06 MED ORDER — FILGRASTIM-SNDZ 300 MCG/0.5ML IJ SOSY
300.0000 ug | PREFILLED_SYRINGE | Freq: Once | INTRAMUSCULAR | Status: AC
  Administered 2023-04-06: 300 ug via SUBCUTANEOUS
  Filled 2023-04-06: qty 0.5

## 2023-04-06 MED FILL — Dexamethasone Sodium Phosphate Inj 100 MG/10ML: INTRAMUSCULAR | Qty: 1 | Status: AC

## 2023-04-06 NOTE — Telephone Encounter (Signed)
Scheduled appointments per WQ. Patient is aware of the made appointments.  

## 2023-04-08 ENCOUNTER — Inpatient Hospital Stay: Payer: Managed Care, Other (non HMO)

## 2023-04-08 ENCOUNTER — Other Ambulatory Visit: Payer: Self-pay | Admitting: *Deleted

## 2023-04-08 ENCOUNTER — Other Ambulatory Visit: Payer: Self-pay

## 2023-04-08 VITALS — BP 113/82 | HR 90 | Temp 98.8°F | Resp 16 | Wt 145.4 lb

## 2023-04-08 DIAGNOSIS — Z95828 Presence of other vascular implants and grafts: Secondary | ICD-10-CM

## 2023-04-08 DIAGNOSIS — C50311 Malignant neoplasm of lower-inner quadrant of right female breast: Secondary | ICD-10-CM

## 2023-04-08 LAB — CBC WITH DIFFERENTIAL (CANCER CENTER ONLY)
Abs Immature Granulocytes: 0.14 10*3/uL — ABNORMAL HIGH (ref 0.00–0.07)
Basophils Absolute: 0 10*3/uL (ref 0.0–0.1)
Basophils Relative: 0 %
Eosinophils Absolute: 0 10*3/uL (ref 0.0–0.5)
Eosinophils Relative: 0 %
HCT: 27.4 % — ABNORMAL LOW (ref 36.0–46.0)
Hemoglobin: 9.3 g/dL — ABNORMAL LOW (ref 12.0–15.0)
Immature Granulocytes: 5 %
Lymphocytes Relative: 27 %
Lymphs Abs: 0.7 10*3/uL (ref 0.7–4.0)
MCH: 31.5 pg (ref 26.0–34.0)
MCHC: 33.9 g/dL (ref 30.0–36.0)
MCV: 92.9 fL (ref 80.0–100.0)
Monocytes Absolute: 0.2 10*3/uL (ref 0.1–1.0)
Monocytes Relative: 9 %
Neutro Abs: 1.6 10*3/uL — ABNORMAL LOW (ref 1.7–7.7)
Neutrophils Relative %: 59 %
Platelet Count: 204 10*3/uL (ref 150–400)
RBC: 2.95 MIL/uL — ABNORMAL LOW (ref 3.87–5.11)
RDW: 15.7 % — ABNORMAL HIGH (ref 11.5–15.5)
Smear Review: NORMAL
WBC Count: 2.7 10*3/uL — ABNORMAL LOW (ref 4.0–10.5)
nRBC: 0 % (ref 0.0–0.2)

## 2023-04-08 LAB — CMP (CANCER CENTER ONLY)
ALT: 11 U/L (ref 0–44)
AST: 15 U/L (ref 15–41)
Albumin: 3.8 g/dL (ref 3.5–5.0)
Alkaline Phosphatase: 63 U/L (ref 38–126)
Anion gap: 6 (ref 5–15)
BUN: 13 mg/dL (ref 6–20)
CO2: 28 mmol/L (ref 22–32)
Calcium: 9.2 mg/dL (ref 8.9–10.3)
Chloride: 105 mmol/L (ref 98–111)
Creatinine: 0.61 mg/dL (ref 0.44–1.00)
GFR, Estimated: >60 mL/min (ref >60)
Glucose, Bld: 90 mg/dL (ref 70–99)
Potassium: 3.7 mmol/L (ref 3.5–5.1)
Sodium: 139 mmol/L (ref 135–145)
Total Bilirubin: 0.3 mg/dL (ref 0.3–1.2)
Total Protein: 6.2 g/dL — ABNORMAL LOW (ref 6.5–8.1)

## 2023-04-08 MED ORDER — PALONOSETRON HCL INJECTION 0.25 MG/5ML
0.2500 mg | Freq: Once | INTRAVENOUS | Status: AC
  Administered 2023-04-08: 0.25 mg via INTRAVENOUS
  Filled 2023-04-08: qty 5

## 2023-04-08 MED ORDER — SODIUM CHLORIDE 0.9 % IV SOLN: Freq: Once | INTRAVENOUS | Status: AC

## 2023-04-08 MED ORDER — SODIUM CHLORIDE 0.9 % IV SOLN
187.2000 mg | Freq: Once | INTRAVENOUS | Status: AC
  Administered 2023-04-08: 190 mg via INTRAVENOUS
  Filled 2023-04-08: qty 19

## 2023-04-08 MED ORDER — SODIUM CHLORIDE 0.9 % IV SOLN
10.0000 mg | Freq: Once | INTRAVENOUS | Status: AC
  Administered 2023-04-08: 10 mg via INTRAVENOUS
  Filled 2023-04-08: qty 10

## 2023-04-08 MED ORDER — FAMOTIDINE IN NACL 20-0.9 MG/50ML-% IV SOLN
20.0000 mg | Freq: Once | INTRAVENOUS | Status: AC
  Administered 2023-04-08: 20 mg via INTRAVENOUS
  Filled 2023-04-08: qty 50

## 2023-04-08 MED ORDER — SODIUM CHLORIDE 0.9% FLUSH
10.0000 mL | Freq: Once | INTRAVENOUS | Status: AC
  Administered 2023-04-08: 10 mL

## 2023-04-08 MED ORDER — SODIUM CHLORIDE 0.9% FLUSH
10.0000 mL | INTRAVENOUS | Status: DC | PRN
  Administered 2023-04-08: 10 mL

## 2023-04-08 MED ORDER — HEPARIN SOD (PORK) LOCK FLUSH 100 UNIT/ML IV SOLN
500.0000 [IU] | Freq: Once | INTRAVENOUS | Status: AC | PRN
  Administered 2023-04-08: 500 [IU]

## 2023-04-08 MED ORDER — SODIUM CHLORIDE 0.9 % IV SOLN
80.0000 mg/m2 | Freq: Once | INTRAVENOUS | Status: AC
  Administered 2023-04-08: 138 mg via INTRAVENOUS
  Filled 2023-04-08: qty 23

## 2023-04-08 MED ORDER — DIPHENHYDRAMINE HCL 50 MG/ML IJ SOLN
25.0000 mg | Freq: Once | INTRAMUSCULAR | Status: AC
  Administered 2023-04-08: 25 mg via INTRAVENOUS
  Filled 2023-04-08: qty 1

## 2023-04-08 NOTE — Research (Signed)
TRIAL S2205, ICE COMPRESS: RANDOMIZED TRIAL OF LIMB CRYOCOMPRESSION VERSUS CONTINUOUS COMPRESSION VERSUS LOW CYCLIC COMPRESSION FOR THE PREVENTION  OF TAXANE-INDUCED PERIPHERAL NEUROPATHY   Patient arrives today Accompanied by her husband  for the Week 11 visit. No wounds, sores, or lesions to extremities.  1357 Wraps applied; pre-cooling started. 1435 Taxane started; switched to treatment phase. 1455 Bathroom break. 1501 Restart after bathroom. 1535 Taxane done; pt has 6 minutes to make up for bathroom break. 1541 Switched to post-cooling. 1611 Treatment complete; wraps removed. No wounds, sores, or lesions to extremities.    ADVERSE EVENTS: [name] reports AEs as below; cough is resolved. No new AEs.  ADVERSE EVENT LOGFrancis Poinsett [id number]  04/08/2023   Event Grade Onset Date Resolved Date Drug Name Attribution Treatment Comments  Cough Grade 2 03/25/23  04/08/23   Unrelated   Pt was recently treated for URI and has newly returned home to her dogs, to whom she is allergic                                                                                      GIFT CARD: This study does not provide visit compensation.   DISPOSITION: Upon completion off all study requirements, patient remained in infusion to have her port de-accessed.   The patient was thanked for their time and continued voluntary participation in this study. [name] has been provided direct contact information and is encouraged to contact this Nurse for any needs or questions.  Suzanne Chance Koryn Charlot, RN, BSN, [facility] She  Her  Hers Clinical Research Nurse [facility] Direct Dial [phone]  Pager [phone] 04/08/2023 4:28 PM

## 2023-04-08 NOTE — Patient Instructions (Signed)
[facility]  Discharge Instructions: Thank you for choosing [facility] to provide your oncology and hematology care.   If you have a lab appointment with the Cancer Center, please go directly to the Cancer Center and check in at the registration area.   Wear comfortable clothing and clothing appropriate for easy access to any Portacath or PICC line.   We strive to give you quality time with your provider. You may need to reschedule your appointment if you arrive late (15 or more minutes).  Arriving late affects you and other patients whose appointments are after yours.  Also, if you miss three or more appointments without notifying the office, you may be dismissed from the clinic at the provider's discretion.      For prescription refill requests, have your pharmacy contact our office and allow 72 hours for refills to be completed.    Today you received the following chemotherapy and/or immunotherapy agents: Paclitaxel, Carboplatin.       To help prevent nausea and vomiting after your treatment, we encourage you to take your nausea medication as directed.  BELOW ARE SYMPTOMS THAT SHOULD BE REPORTED IMMEDIATELY: *FEVER GREATER THAN 100.4 F (38 C) OR HIGHER *CHILLS OR SWEATING *NAUSEA AND VOMITING THAT IS NOT CONTROLLED WITH YOUR NAUSEA MEDICATION *UNUSUAL SHORTNESS OF BREATH *UNUSUAL BRUISING OR BLEEDING *URINARY PROBLEMS (pain or burning when urinating, or frequent urination) *BOWEL PROBLEMS (unusual diarrhea, constipation, pain near the anus) TENDERNESS IN MOUTH AND THROAT WITH OR WITHOUT PRESENCE OF ULCERS (sore throat, sores in mouth, or a toothache) UNUSUAL RASH, SWELLING OR PAIN  UNUSUAL VAGINAL DISCHARGE OR ITCHING   Items with * indicate a potential emergency and should be followed up as soon as possible or go to the Emergency Department if any problems should occur.  Please show the CHEMOTHERAPY ALERT CARD or IMMUNOTHERAPY  ALERT CARD at check-in to the Emergency Department and triage nurse.  Should you have questions after your visit or need to cancel or reschedule your appointment, please contact [facility]  Dept: [phone]  and follow the prompts.  Office hours are 8:00 a.m. to 4:30 p.m. Monday - Friday. Please note that voicemails left after 4:00 p.m. may not be returned until the following business day.  We are closed weekends and major holidays. You have access to a nurse at all times for urgent questions. Please call the main number to the clinic Dept: [phone] and follow the prompts.   For any non-urgent questions, you may also contact your provider using MyChart. We now offer e-Visits for anyone 18 and older to request care online for non-urgent symptoms. For details visit [email or web address].   Also download the MyChart app! Go to the app store, search "MyChart", open the app, select [place], and log in with your MyChart username and password.   

## 2023-04-11 ENCOUNTER — Other Ambulatory Visit: Payer: Self-pay

## 2023-04-11 DIAGNOSIS — Z171 Estrogen receptor negative status [ER-]: Secondary | ICD-10-CM

## 2023-04-13 ENCOUNTER — Inpatient Hospital Stay: Payer: Managed Care, Other (non HMO)

## 2023-04-13 ENCOUNTER — Other Ambulatory Visit: Payer: Self-pay

## 2023-04-13 ENCOUNTER — Encounter: Payer: Self-pay | Admitting: Hematology and Oncology

## 2023-04-13 VITALS — BP 117/77 | HR 94 | Temp 98.5°F | Resp 16

## 2023-04-13 DIAGNOSIS — C50311 Malignant neoplasm of lower-inner quadrant of right female breast: Secondary | ICD-10-CM | POA: Diagnosis not present

## 2023-04-13 DIAGNOSIS — Z171 Estrogen receptor negative status [ER-]: Secondary | ICD-10-CM

## 2023-04-13 MED ORDER — FILGRASTIM-SNDZ 300 MCG/0.5ML IJ SOSY
300.0000 ug | PREFILLED_SYRINGE | Freq: Once | INTRAMUSCULAR | Status: AC
  Administered 2023-04-13: 300 ug via SUBCUTANEOUS
  Filled 2023-04-13: qty 0.5

## 2023-04-14 MED FILL — Dexamethasone Sodium Phosphate Inj 100 MG/10ML: INTRAMUSCULAR | Qty: 1 | Status: AC

## 2023-04-14 NOTE — Progress Notes (Signed)
Patient Care Team: Georgina Quint, MD as PCP - General (Internal Medicine) Corky Crafts, MD as PCP - Cardiology (Cardiology) Duke Salvia, MD as PCP - Electrophysiology (Cardiology) Serena Croissant, MD as Consulting Physician (Hematology and Oncology) Lonie Peak, MD as Attending Physician (Radiation Oncology) [name], MD as Consulting Physician (General Surgery) Donnelly Angelica, RN as Oncology Nurse Navigator Pershing Proud, RN as Oncology Nurse Navigator  DIAGNOSIS:  Encounter Diagnosis  Name Primary?   Malignant neoplasm of lower-inner quadrant of right breast of female, estrogen receptor negative (HCC) Yes    SUMMARY OF ONCOLOGIC HISTORY: Oncology History  Malignant neoplasm of lower-inner quadrant of right breast of female, estrogen receptor negative (HCC)  01/10/2023 Initial Diagnosis   Palpable lump right axilla x 2 weeks bulky axillary lymphadenopathy ultrasound breast: 2 masses at 4 o'clock position 1.1 cm and 0.6 cm multiple axillary lymph nodes largest 4.5 cm also level 2 and 3 (subpectoral lymph node as well): Biopsy: Grade 3 IDC ER 0%, PR 0%, Ki-67 95%, HER2 1+   01/26/2023 Genetic Testing   Negative [employer] Custom Panel (37 genes).  Report date is 01/26/2023.    The [employer] Custom Cancers + RNA Panel includes sequencing, deletion/duplication, and RNA analysis of the following 37 genes: APC, ATM, AXIN2, BARD1, BMPR1A, BRCA1, BRCA2, BRIP1, CDH1, CDK4*, CDKN2A (p14ARF)*, CDKN2A (p16INK4a), CHEK2, CTNNA1, DICER1, EPCAM*, FH, GREM1*, HOXB13*, MBD4*, MLH1, MSH2, MSH3, MSH6, MUTYH, NF1, NTHL1, PALB2, PMS2, POLD1, POLE, PTEN, RAD51C, RAD51D, SMAD4, SMARCA4, STK11, TP53.  *Genes without RNA analysis.    01/28/2023 -  Chemotherapy   Patient is on Treatment Plan : BREAST Pembrolizumab (200) D1 + Carboplatin (1.5) D1,8,15 + Paclitaxel (80) D1,8,15 q21d X 4 cycles / Pembrolizumab (200) D1 + AC D1 q21d x 4 cycles       CHIEF COMPLIANT: Cycle 12 Taxol carbo    INTERVAL HISTORY: Suzanne Tucker is a 42 y.o. female is here because of recent diagnosis of right breast cancer. Currently 42 y.o. female is here because of recent diagnosis of right breast  on Cycle  12 Taxol carbo. She presents to the clinic for a follow-up. Pt reports no new issues or concerns to the clinic today. Her only complaint is bone ache after the injection. She did have some eye twitch from the Taxol. Denies any nausea.   ALLERGIES:  is allergic to morphine; tessalon [benzonatate]; hydrocodone; latex; antihistamines, loratadine-type; codeine; and tramadol.  MEDICATIONS:  Current Outpatient Medications  Medication Sig Dispense Refill   acetaminophen (TYLENOL) 500 MG tablet Take 1,000 mg by mouth in the morning and at bedtime.     calcium carbonate (TUMS - DOSED IN MG ELEMENTAL CALCIUM) 500 MG chewable tablet Chew 1,000 mg by mouth as needed for indigestion or heartburn.     dexamethasone (DECADRON) 4 MG tablet Take 1 tablet (4 mg total) by mouth daily. Take with Adriamycin 1 tablet day after chemo and 1 tablet 2 days after chemo with food 8 tablet 1   fexofenadine (ALLEGRA) 180 MG tablet Take 180 mg by mouth as needed for allergies or rhinitis.     fluticasone (FLONASE) 50 MCG/ACT nasal spray Place 2 sprays into both nostrils daily. 16 g 0   lidocaine-prilocaine (EMLA) cream Apply to affected area once 30 g 3   ondansetron (ZOFRAN) 8 MG tablet Take 1 tablet (8 mg total) by mouth every 8 (eight) hours as needed for nausea or vomiting. Start on the third day after chemotherapy. 30 tablet 1  prochlorperazine (COMPAZINE) 10 MG tablet Take 1 tablet (10 mg total) by mouth every 6 (six) hours as needed for nausea or vomiting. 30 tablet 1   No current facility-administered medications for this visit.    PHYSICAL EXAMINATION: ECOG PERFORMANCE STATUS: 1 - Symptomatic but completely ambulatory  Vitals:   04/15/23 1134  BP: 127/70  Pulse: 96  Resp: 18  Temp: 97.9 F (36.6 C)  SpO2:  99%   Filed Weights   04/15/23 1134  Weight: 147 lb 1 oz (66.7 kg)   Axilla: Small palpable nodule the size of a grape in the right axilla  LABORATORY DATA:  I have reviewed the data as listed    Latest Ref Rng & Units 04/15/2023   11:12 AM 04/08/2023   11:10 AM 04/01/2023   11:48 AM  CMP  Glucose 70 - 99 mg/dL 83  90  90   BUN 6 - 20 mg/dL 14  13  7    Creatinine 0.44 - 1.00 mg/dL 0.10  2.72  5.36   Sodium 135 - 145 mmol/L 137  139  138   Potassium 3.5 - 5.1 mmol/L 3.8  3.7  3.7   Chloride 98 - 111 mmol/L 105  105  105   CO2 22 - 32 mmol/L 26  28  26    Calcium 8.9 - 10.3 mg/dL 9.2  9.2  9.3   Total Protein 6.5 - 8.1 g/dL 7.0  6.2  7.0   Total Bilirubin 0.3 - 1.2 mg/dL 0.4  0.3  0.4   Alkaline Phos 38 - 126 U/L 61  63  63   AST 15 - 41 U/L 15  15  17    ALT 0 - 44 U/L 10  11  13      Lab Results  Component Value Date   WBC 5.2 04/15/2023   HGB 9.7 (L) 04/15/2023   HCT 28.3 (L) 04/15/2023   MCV 92.2 04/15/2023   PLT 174 04/15/2023   NEUTROABS PENDING 04/15/2023    ASSESSMENT & PLAN:  Malignant neoplasm of lower-inner quadrant of right breast of female, estrogen receptor negative (HCC) 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%, HER2 1+  01/25/2023: PET/CT: Right breast cancer, right axilla, retropectoral and right cervical lymph nodes   Treatment plan: Neoadjuvant chemotherapy with Taxol carbo Keytruda weekly x 12 followed by Adriamycin Cytoxan Keytruda followed by Rande Lawman maintenance Breast conserving surgery with ALND Adjuvant radiation therapy ------------------------------------------------------------------------------------------------------------------------------------------- Current treatment: Cycle 12 Taxol carbo (with  Keytruda every 3 weeks) Chemo toxicities: Anxiety/panic attack: Resolved Mild fatigue Leukopenia: Much improved after Granix injection   Denies any nausea or vomiting   Return to clinic in 1 week to start Adriamycin Cytoxan Keytruda    No orders of the defined types were placed in this encounter.  The patient has a good understanding of the overall plan. she agrees with it. she will call with any problems that may develop before the next visit here. Total time spent: 30 mins including face to face time and time spent for planning, charting and co-ordination of care   Tamsen Meek, MD 04/15/23    I Janan Ridge am acting as a Neurosurgeon for Dr.Alante Weimann  I have reviewed the above documentation for accuracy and completeness, and  I agree with the above.

## 2023-04-15 ENCOUNTER — Inpatient Hospital Stay (HOSPITAL_BASED_OUTPATIENT_CLINIC_OR_DEPARTMENT_OTHER): Payer: Managed Care, Other (non HMO) | Admitting: Hematology and Oncology

## 2023-04-15 ENCOUNTER — Inpatient Hospital Stay: Payer: Managed Care, Other (non HMO)

## 2023-04-15 ENCOUNTER — Other Ambulatory Visit: Payer: Self-pay

## 2023-04-15 ENCOUNTER — Encounter: Payer: Self-pay | Admitting: *Deleted

## 2023-04-15 VITALS — BP 127/70 | HR 96 | Temp 97.9°F | Resp 18 | Ht 61.0 in | Wt 147.1 lb

## 2023-04-15 DIAGNOSIS — Z171 Estrogen receptor negative status [ER-]: Secondary | ICD-10-CM

## 2023-04-15 DIAGNOSIS — C50311 Malignant neoplasm of lower-inner quadrant of right female breast: Secondary | ICD-10-CM

## 2023-04-15 DIAGNOSIS — Z95828 Presence of other vascular implants and grafts: Secondary | ICD-10-CM

## 2023-04-15 LAB — CBC WITH DIFFERENTIAL (CANCER CENTER ONLY)
Abs Immature Granulocytes: 0.08 10*3/uL — ABNORMAL HIGH (ref 0.00–0.07)
Basophils Absolute: 0 10*3/uL (ref 0.0–0.1)
Basophils Relative: 1 %
Eosinophils Absolute: 0 10*3/uL (ref 0.0–0.5)
Eosinophils Relative: 1 %
HCT: 28.3 % — ABNORMAL LOW (ref 36.0–46.0)
Hemoglobin: 9.7 g/dL — ABNORMAL LOW (ref 12.0–15.0)
Immature Granulocytes: 2 %
Lymphocytes Relative: 18 %
Lymphs Abs: 0.9 10*3/uL (ref 0.7–4.0)
MCH: 31.6 pg (ref 26.0–34.0)
MCHC: 34.3 g/dL (ref 30.0–36.0)
MCV: 92.2 fL (ref 80.0–100.0)
Monocytes Absolute: 0.3 10*3/uL (ref 0.1–1.0)
Monocytes Relative: 6 %
Neutro Abs: 3.9 10*3/uL (ref 1.7–7.7)
Neutrophils Relative %: 72 %
Platelet Count: 174 10*3/uL (ref 150–400)
RBC: 3.07 MIL/uL — ABNORMAL LOW (ref 3.87–5.11)
RDW: 15.3 % (ref 11.5–15.5)
Smear Review: NORMAL
WBC Count: 5.2 10*3/uL (ref 4.0–10.5)
nRBC: 0 % (ref 0.0–0.2)

## 2023-04-15 LAB — CMP (CANCER CENTER ONLY)
ALT: 10 U/L (ref 0–44)
AST: 15 U/L (ref 15–41)
Albumin: 4.1 g/dL (ref 3.5–5.0)
Alkaline Phosphatase: 61 U/L (ref 38–126)
Anion gap: 6 (ref 5–15)
BUN: 14 mg/dL (ref 6–20)
CO2: 26 mmol/L (ref 22–32)
Calcium: 9.2 mg/dL (ref 8.9–10.3)
Chloride: 105 mmol/L (ref 98–111)
Creatinine: 0.55 mg/dL (ref 0.44–1.00)
GFR, Estimated: >60 mL/min (ref >60)
Glucose, Bld: 83 mg/dL (ref 70–99)
Potassium: 3.8 mmol/L (ref 3.5–5.1)
Sodium: 137 mmol/L (ref 135–145)
Total Bilirubin: 0.4 mg/dL (ref 0.3–1.2)
Total Protein: 7 g/dL (ref 6.5–8.1)

## 2023-04-15 MED ORDER — SODIUM CHLORIDE 0.9% FLUSH
10.0000 mL | Freq: Once | INTRAVENOUS | Status: AC
  Administered 2023-04-15: 10 mL

## 2023-04-15 MED ORDER — FAMOTIDINE IN NACL 20-0.9 MG/50ML-% IV SOLN
20.0000 mg | Freq: Once | INTRAVENOUS | Status: AC
  Administered 2023-04-15: 20 mg via INTRAVENOUS
  Filled 2023-04-15: qty 50

## 2023-04-15 MED ORDER — PALONOSETRON HCL INJECTION 0.25 MG/5ML
0.2500 mg | Freq: Once | INTRAVENOUS | Status: AC
  Administered 2023-04-15: 0.25 mg via INTRAVENOUS
  Filled 2023-04-15: qty 5

## 2023-04-15 MED ORDER — DIPHENHYDRAMINE HCL 50 MG/ML IJ SOLN
25.0000 mg | Freq: Once | INTRAMUSCULAR | Status: AC
  Administered 2023-04-15: 25 mg via INTRAVENOUS
  Filled 2023-04-15: qty 1

## 2023-04-15 MED ORDER — SODIUM CHLORIDE 0.9 % IV SOLN: Freq: Once | INTRAVENOUS | Status: AC

## 2023-04-15 MED ORDER — SODIUM CHLORIDE 0.9 % IV SOLN
187.2000 mg | Freq: Once | INTRAVENOUS | Status: AC
  Administered 2023-04-15: 190 mg via INTRAVENOUS
  Filled 2023-04-15: qty 19

## 2023-04-15 MED ORDER — SODIUM CHLORIDE 0.9% FLUSH
10.0000 mL | INTRAVENOUS | Status: DC | PRN
  Administered 2023-04-15: 10 mL

## 2023-04-15 MED ORDER — SODIUM CHLORIDE 0.9 % IV SOLN
80.0000 mg/m2 | Freq: Once | INTRAVENOUS | Status: AC
  Administered 2023-04-15: 138 mg via INTRAVENOUS
  Filled 2023-04-15: qty 23

## 2023-04-15 MED ORDER — HEPARIN SOD (PORK) LOCK FLUSH 100 UNIT/ML IV SOLN
500.0000 [IU] | Freq: Once | INTRAVENOUS | Status: AC | PRN
  Administered 2023-04-15: 500 [IU]

## 2023-04-15 MED ORDER — SODIUM CHLORIDE 0.9 % IV SOLN
10.0000 mg | Freq: Once | INTRAVENOUS | Status: AC
  Administered 2023-04-15: 10 mg via INTRAVENOUS
  Filled 2023-04-15: qty 10

## 2023-04-15 NOTE — Assessment & Plan Note (Signed)
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%, HER2 1+  01/25/2023: PET/CT: Right breast cancer, right axilla, retropectoral and right cervical lymph nodes   Treatment plan: Neoadjuvant chemotherapy with Taxol carbo Keytruda weekly x 12 followed by Adriamycin Cytoxan Keytruda followed by Rande Lawman maintenance Breast conserving surgery with ALND Adjuvant radiation therapy ------------------------------------------------------------------------------------------------------------------------------------------- Current treatment: Cycle 12 Taxol carbo (with Keytruda every 3 weeks) Chemo toxicities: Anxiety/panic attack: Resolved Mild fatigue Leukopenia: ANC 1.2.  We plan to give Granix injection 2 days before each of her next of weekly chemotherapy treatments. Okay to treat with an ANC of 1.2.   Denies any nausea or vomiting   Return to clinic in 1 week to start Adriamycin Cytoxan [facility]

## 2023-04-15 NOTE — Research (Addendum)
TRIAL S2205, ICE COMPRESS: RANDOMIZED TRIAL OF LIMB CRYOCOMPRESSION VERSUS CONTINUOUS COMPRESSION VERSUS LOW CYCLIC COMPRESSION FOR THE PREVENTION  OF TAXANE-INDUCED PERIPHERAL NEUROPATHY   Patient arrives today Accompanied by friend Suzanne Tucker  for the Week 12 visit.    PROs: Per study protocol, all PROs required for this visit were completed prior to other study activities and completeness has been verified.     LABS: Optional labs are collected per consent and study protocol: [name] tolerated well without complaint.   MD/PROVIDER VISIT: Patient sees Dr [name] for today's visit.   ADVERSE EVENTS: [name] reports no AEs.  ADVERSE EVENT LOGKaela Tucker [id number]  04/15/2023  Adverse Event Log  Event Grade Onset Date Resolved Date Drug Name Attribution Treatment Comments                                                          NEURO ASSESSMENT: The neuro assessment was completed by this nurse. [name] tolerated all testing without complaint.   GIFT CARD: This study does not provide visit compensation.   DISPOSITION: Upon completion off all study requirements, patient remained in infusion to complete treatment.   The patient was thanked for their time and continued voluntary participation in this study. [name] has been provided direct contact information and is encouraged to contact this Nurse for any needs or questions. Reminded patient that next visit with research will be the Week 24 visit; she verbalized understanding and will reach out with problems/questions/concerns.  Suzanne Chance Fritzie Prioleau, RN, BSN, [facility] She  Her  [employer] Clinical Research Nurse [facility] Direct Dial [phone]  Pager [phone] 04/15/2023 4:26 PM   ADDENDUM  1357 Wraps applied; no wounds, sores, or lesions to extremities 1434 Taxane started; moved to treatment phase 1441 Bathroom break 1447 Return from bathroom;  will need to make up 6 minutes  1534 Taxane done 1540 Make up time complete; moved to post cooling phase. 1610 Treatment done; wraps removed; no wounds, sores, or lesions to extremities.  Suzanne Chance Karandeep Resende, RN, BSN, [facility] She  Her  Hers Clinical Research Nurse [facility] Direct Dial [phone]  Pager [phone] 04/18/2023 9:53 AM

## 2023-04-15 NOTE — Patient Instructions (Signed)
[facility]  Discharge Instructions: Thank you for choosing [facility] to provide your oncology and hematology care.   If you have a lab appointment with the Cancer Center, please go directly to the Cancer Center and check in at the registration area.   Wear comfortable clothing and clothing appropriate for easy access to any Portacath or PICC line.   We strive to give you quality time with your provider. You may need to reschedule your appointment if you arrive late (15 or more minutes).  Arriving late affects you and other patients whose appointments are after yours.  Also, if you miss three or more appointments without notifying the office, you may be dismissed from the clinic at the provider's discretion.      For prescription refill requests, have your pharmacy contact our office and allow 72 hours for refills to be completed.    Today you received the following chemotherapy and/or immunotherapy agents: Paclitaxel, Carboplatin.       To help prevent nausea and vomiting after your treatment, we encourage you to take your nausea medication as directed.  BELOW ARE SYMPTOMS THAT SHOULD BE REPORTED IMMEDIATELY: *FEVER GREATER THAN 100.4 F (38 C) OR HIGHER *CHILLS OR SWEATING *NAUSEA AND VOMITING THAT IS NOT CONTROLLED WITH YOUR NAUSEA MEDICATION *UNUSUAL SHORTNESS OF BREATH *UNUSUAL BRUISING OR BLEEDING *URINARY PROBLEMS (pain or burning when urinating, or frequent urination) *BOWEL PROBLEMS (unusual diarrhea, constipation, pain near the anus) TENDERNESS IN MOUTH AND THROAT WITH OR WITHOUT PRESENCE OF ULCERS (sore throat, sores in mouth, or a toothache) UNUSUAL RASH, SWELLING OR PAIN  UNUSUAL VAGINAL DISCHARGE OR ITCHING   Items with * indicate a potential emergency and should be followed up as soon as possible or go to the Emergency Department if any problems should occur.  Please show the CHEMOTHERAPY ALERT CARD or IMMUNOTHERAPY  ALERT CARD at check-in to the Emergency Department and triage nurse.  Should you have questions after your visit or need to cancel or reschedule your appointment, please contact [facility]  Dept: [phone]  and follow the prompts.  Office hours are 8:00 a.m. to 4:30 p.m. Monday - Friday. Please note that voicemails left after 4:00 p.m. may not be returned until the following business day.  We are closed weekends and major holidays. You have access to a nurse at all times for urgent questions. Please call the main number to the clinic Dept: [phone] and follow the prompts.   For any non-urgent questions, you may also contact your provider using MyChart. We now offer e-Visits for anyone 18 and older to request care online for non-urgent symptoms. For details visit [email or web address].   Also download the MyChart app! Go to the app store, search "MyChart", open the app, select [place] [facility], and log in with your MyChart username and password.   

## 2023-04-19 ENCOUNTER — Ambulatory Visit (HOSPITAL_COMMUNITY): Payer: Managed Care, Other (non HMO) | Attending: Cardiology

## 2023-04-19 ENCOUNTER — Encounter: Payer: Self-pay | Admitting: Hematology and Oncology

## 2023-04-19 DIAGNOSIS — C50311 Malignant neoplasm of lower-inner quadrant of right female breast: Secondary | ICD-10-CM | POA: Diagnosis present

## 2023-04-19 DIAGNOSIS — Z171 Estrogen receptor negative status [ER-]: Secondary | ICD-10-CM | POA: Diagnosis not present

## 2023-04-19 LAB — ECHOCARDIOGRAM COMPLETE
Area-P 1/2: 4.71 cm2
S' Lateral: 3.1 cm

## 2023-04-20 ENCOUNTER — Inpatient Hospital Stay: Payer: Managed Care, Other (non HMO)

## 2023-04-20 VITALS — BP 119/82 | HR 100 | Temp 98.9°F | Resp 18

## 2023-04-20 DIAGNOSIS — C50311 Malignant neoplasm of lower-inner quadrant of right female breast: Secondary | ICD-10-CM

## 2023-04-20 MED ORDER — FILGRASTIM-SNDZ 300 MCG/0.5ML IJ SOSY
300.0000 ug | PREFILLED_SYRINGE | Freq: Once | INTRAMUSCULAR | Status: AC
  Administered 2023-04-20: 300 ug via SUBCUTANEOUS
  Filled 2023-04-20: qty 0.5

## 2023-04-21 MED FILL — Fosaprepitant Dimeglumine For IV Infusion 150 MG (Base Eq): INTRAVENOUS | Qty: 5 | Status: AC

## 2023-04-21 MED FILL — Dexamethasone Sodium Phosphate Inj 100 MG/10ML: INTRAMUSCULAR | Qty: 1 | Status: AC

## 2023-04-22 ENCOUNTER — Inpatient Hospital Stay: Payer: Managed Care, Other (non HMO)

## 2023-04-22 VITALS — BP 114/83 | HR 86 | Temp 99.5°F | Resp 17

## 2023-04-22 DIAGNOSIS — C50311 Malignant neoplasm of lower-inner quadrant of right female breast: Secondary | ICD-10-CM | POA: Diagnosis not present

## 2023-04-22 DIAGNOSIS — Z171 Estrogen receptor negative status [ER-]: Secondary | ICD-10-CM

## 2023-04-22 DIAGNOSIS — Z95828 Presence of other vascular implants and grafts: Secondary | ICD-10-CM

## 2023-04-22 LAB — CBC WITH DIFFERENTIAL (CANCER CENTER ONLY)
Abs Immature Granulocytes: 0.04 10*3/uL (ref 0.00–0.07)
Basophils Absolute: 0 10*3/uL (ref 0.0–0.1)
Basophils Relative: 0 %
Eosinophils Absolute: 0.1 10*3/uL (ref 0.0–0.5)
Eosinophils Relative: 1 %
HCT: 26.5 % — ABNORMAL LOW (ref 36.0–46.0)
Hemoglobin: 9 g/dL — ABNORMAL LOW (ref 12.0–15.0)
Immature Granulocytes: 1 %
Lymphocytes Relative: 24 %
Lymphs Abs: 1.2 10*3/uL (ref 0.7–4.0)
MCH: 31.7 pg (ref 26.0–34.0)
MCHC: 34 g/dL (ref 30.0–36.0)
MCV: 93.3 fL (ref 80.0–100.0)
Monocytes Absolute: 0.3 10*3/uL (ref 0.1–1.0)
Monocytes Relative: 5 %
Neutro Abs: 3.6 10*3/uL (ref 1.7–7.7)
Neutrophils Relative %: 69 %
Platelet Count: 137 10*3/uL — ABNORMAL LOW (ref 150–400)
RBC: 2.84 MIL/uL — ABNORMAL LOW (ref 3.87–5.11)
RDW: 15.2 % (ref 11.5–15.5)
WBC Count: 5.1 10*3/uL (ref 4.0–10.5)
nRBC: 0 % (ref 0.0–0.2)

## 2023-04-22 LAB — CMP (CANCER CENTER ONLY)
ALT: 10 U/L (ref 0–44)
AST: 15 U/L (ref 15–41)
Albumin: 4 g/dL (ref 3.5–5.0)
Alkaline Phosphatase: 59 U/L (ref 38–126)
Anion gap: 4 — ABNORMAL LOW (ref 5–15)
BUN: 12 mg/dL (ref 6–20)
CO2: 27 mmol/L (ref 22–32)
Calcium: 9 mg/dL (ref 8.9–10.3)
Chloride: 106 mmol/L (ref 98–111)
Creatinine: 0.53 mg/dL (ref 0.44–1.00)
GFR, Estimated: >60 mL/min (ref >60)
Glucose, Bld: 97 mg/dL (ref 70–99)
Potassium: 3.8 mmol/L (ref 3.5–5.1)
Sodium: 137 mmol/L (ref 135–145)
Total Bilirubin: 0.4 mg/dL (ref 0.3–1.2)
Total Protein: 6.8 g/dL (ref 6.5–8.1)

## 2023-04-22 MED ORDER — PALONOSETRON HCL INJECTION 0.25 MG/5ML
0.2500 mg | Freq: Once | INTRAVENOUS | Status: AC
  Administered 2023-04-22: 0.25 mg via INTRAVENOUS

## 2023-04-22 MED ORDER — SODIUM CHLORIDE 0.9 % IV SOLN: Freq: Once | INTRAVENOUS | Status: AC

## 2023-04-22 MED ORDER — SODIUM CHLORIDE 0.9 % IV SOLN
10.0000 mg | Freq: Once | INTRAVENOUS | Status: AC
  Administered 2023-04-22: 10 mg via INTRAVENOUS
  Filled 2023-04-22: qty 10

## 2023-04-22 MED ORDER — SODIUM CHLORIDE 0.9 % IV SOLN
200.0000 mg | Freq: Once | INTRAVENOUS | Status: AC
  Administered 2023-04-22: 200 mg via INTRAVENOUS
  Filled 2023-04-22: qty 200

## 2023-04-22 MED ORDER — SODIUM CHLORIDE 0.9 % IV SOLN
150.0000 mg | Freq: Once | INTRAVENOUS | Status: AC
  Administered 2023-04-22: 150 mg via INTRAVENOUS
  Filled 2023-04-22: qty 150

## 2023-04-22 MED ORDER — DOXORUBICIN HCL CHEMO IV INJECTION 2 MG/ML
60.0000 mg/m2 | Freq: Once | INTRAVENOUS | Status: AC
  Administered 2023-04-22: 102 mg via INTRAVENOUS
  Filled 2023-04-22: qty 51

## 2023-04-22 MED ORDER — SODIUM CHLORIDE 0.9 % IV SOLN
600.0000 mg/m2 | Freq: Once | INTRAVENOUS | Status: AC
  Administered 2023-04-22: 1000 mg via INTRAVENOUS
  Filled 2023-04-22: qty 50

## 2023-04-22 MED ORDER — SODIUM CHLORIDE 0.9% FLUSH
10.0000 mL | INTRAVENOUS | Status: DC | PRN
  Administered 2023-04-22: 10 mL

## 2023-04-22 MED ORDER — SODIUM CHLORIDE 0.9% FLUSH
10.0000 mL | Freq: Once | INTRAVENOUS | Status: AC
  Administered 2023-04-22: 10 mL

## 2023-04-22 MED ORDER — HEPARIN SOD (PORK) LOCK FLUSH 100 UNIT/ML IV SOLN
500.0000 [IU] | Freq: Once | INTRAVENOUS | Status: AC | PRN
  Administered 2023-04-22: 500 [IU]

## 2023-04-22 NOTE — Patient Instructions (Signed)
[facility]  Discharge Instructions: Thank you for choosing [facility] to provide your oncology and hematology care.   If you have a lab appointment with the Cancer Center, please go directly to the Cancer Center and check in at the registration area.   Wear comfortable clothing and clothing appropriate for easy access to any Portacath or PICC line.   We strive to give you quality time with your provider. You may need to reschedule your appointment if you arrive late (15 or more minutes).  Arriving late affects you and other patients whose appointments are after yours.  Also, if you miss three or more appointments without notifying the office, you may be dismissed from the clinic at the provider's discretion.      For prescription refill requests, have your pharmacy contact our office and allow 72 hours for refills to be completed.    Today you received the following chemotherapy and/or immunotherapy agents Keytruda, Adriamycin, Cytoxan      To help prevent nausea and vomiting after your treatment, we encourage you to take your nausea medication as directed.  BELOW ARE SYMPTOMS THAT SHOULD BE REPORTED IMMEDIATELY: *FEVER GREATER THAN 100.4 F (38 C) OR HIGHER *CHILLS OR SWEATING *NAUSEA AND VOMITING THAT IS NOT CONTROLLED WITH YOUR NAUSEA MEDICATION *UNUSUAL SHORTNESS OF BREATH *UNUSUAL BRUISING OR BLEEDING *URINARY PROBLEMS (pain or burning when urinating, or frequent urination) *BOWEL PROBLEMS (unusual diarrhea, constipation, pain near the anus) TENDERNESS IN MOUTH AND THROAT WITH OR WITHOUT PRESENCE OF ULCERS (sore throat, sores in mouth, or a toothache) UNUSUAL RASH, SWELLING OR PAIN  UNUSUAL VAGINAL DISCHARGE OR ITCHING   Items with * indicate a potential emergency and should be followed up as soon as possible or go to the Emergency Department if any problems should occur.  Please show the CHEMOTHERAPY ALERT CARD or IMMUNOTHERAPY  ALERT CARD at check-in to the Emergency Department and triage nurse.  Should you have questions after your visit or need to cancel or reschedule your appointment, please contact [facility]  Dept: [phone]  and follow the prompts.  Office hours are 8:00 a.m. to 4:30 p.m. Monday - Friday. Please note that voicemails left after 4:00 p.m. may not be returned until the following business day.  We are closed weekends and major holidays. You have access to a nurse at all times for urgent questions. Please call the main number to the clinic Dept: [phone] and follow the prompts.   For any non-urgent questions, you may also contact your provider using MyChart. We now offer e-Visits for anyone 45 and older to request care online for non-urgent symptoms. For details visit [email or web address].   Also download the MyChart app! Go to the app store, search "MyChart", open the app, select , and log in with your MyChart username and password.  Doxorubicin Injection What is this medication? DOXORUBICIN (dox oh ROO bi sin) treats some types of cancer. It works by slowing down the growth of cancer cells. This medicine may be used for other purposes; ask your health care provider or pharmacist if you have questions. COMMON BRAND NAME(S): Adriamycin, Adriamycin PFS, Adriamycin RDF, Rubex What should I tell my care team before I take this medication? They need to know if you have any of these conditions: Heart disease History of low blood cell levels caused by a medication Liver disease Recent or ongoing radiation An unusual or allergic reaction to doxorubicin, other  medications, foods, dyes, or preservatives If you or your partner are pregnant or trying to get pregnant Breast-feeding How should I use this medication? This medication is injected into a vein. It is given by your care team in a hospital or clinic setting. Talk to your care team about the  use of this medication in children. Special care may be needed. Overdosage: If you think you have taken too much of this medicine contact a poison control center or emergency room at once. NOTE: This medicine is only for you. Do not share this medicine with others. What if I miss a dose? Keep appointments for follow-up doses. It is important not to miss your dose. Call your care team if you are unable to keep an appointment. What may interact with this medication? 6-mercaptopurine Paclitaxel Phenytoin St. John's wort Trastuzumab Verapamil This list may not describe all possible interactions. Give your health care provider a list of all the medicines, herbs, non-prescription drugs, or dietary supplements you use. Also tell them if you smoke, drink alcohol, or use illegal drugs. Some items may interact with your medicine. What should I watch for while using this medication? Your condition will be monitored carefully while you are receiving this medication. You may need blood work while taking this medication. This medication may make you feel generally unwell. This is not uncommon as chemotherapy can affect healthy cells as well as cancer cells. Report any side effects. Continue your course of treatment even though you feel ill unless your care team tells you to stop. There is a maximum amount of this medication you should receive throughout your life. The amount depends on the medical condition being treated and your overall health. Your care team will watch how much of this medication you receive. Tell your care team if you have taken this medication before. Your urine may turn red for a few days after your dose. This is not blood. If your urine is dark or brown, call your care team. In some cases, you may be given additional medications to help with side effects. Follow all directions for their use. This medication may increase your risk of getting an infection. Call your care team for advice if  you get a fever, chills, sore throat, or other symptoms of a cold or flu. Do not treat yourself. Try to avoid being around people who are sick. This medication may increase your risk to bruise or bleed. Call your care team if you notice any unusual bleeding. Talk to your care team about your risk of cancer. You may be more at risk for certain types of cancers if you take this medication. Talk to your care team if you or your partner may be pregnant. Serious birth defects can occur if you take this medication during pregnancy and for 6 months after the last dose. Contraception is recommended while taking this medication and for 6 months after the last dose. Your care team can help you find the option that works for you. If your partner can get pregnant, use a condom while taking this medication and for 6 months after the last dose. Do not breastfeed while taking this medication. This medication may cause infertility. Talk to your care team if you are concerned about your fertility. What side effects may I notice from receiving this medication? Side effects that you should report to your care team as soon as possible: Allergic reactions--skin rash, itching, hives, swelling of the face, lips, tongue, or throat Heart failure--shortness of  breath, swelling of the ankles, feet, or hands, sudden weight gain, unusual weakness or fatigue Heart rhythm changes--fast or irregular heartbeat, dizziness, feeling faint or lightheaded, chest pain, trouble breathing Infection--fever, chills, cough, sore throat, wounds that don't heal, pain or trouble when passing urine, general feeling of discomfort or being unwell Low red blood cell level--unusual weakness or fatigue, dizziness, headache, trouble breathing Painful swelling, warmth, or redness of the skin, blisters or sores at the infusion site Unusual bruising or bleeding Side effects that usually do not require medical attention (report to your care team if they  continue or are bothersome): Diarrhea Hair loss Nausea Pain, redness, or swelling with sores inside the mouth or throat Red urine This list may not describe all possible side effects. Call your doctor for medical advice about side effects. You may report side effects to FDA at 1-800-FDA-1088. Where should I keep my medication? This medication is given in a hospital or clinic. It will not be stored at home. NOTE: This sheet is a summary. It may not cover all possible information. If you have questions about this medicine, talk to your doctor, pharmacist, or health care provider.  2024 Elsevier/Gold Standard (2022-12-23 00:00:00)  Cyclophosphamide Injection What is this medication? CYCLOPHOSPHAMIDE (sye kloe FOSS fa mide) treats some types of cancer. It works by slowing down the growth of cancer cells. This medicine may be used for other purposes; ask your health care provider or pharmacist if you have questions. COMMON BRAND NAME(S): Cyclophosphamide, Cytoxan, Neosar What should I tell my care team before I take this medication? They need to know if you have any of these conditions: Heart disease Irregular heartbeat or rhythm Infection Kidney problems Liver disease Low blood cell levels (white cells, platelets, or red blood cells) Lung disease Previous radiation Trouble passing urine An unusual or allergic reaction to cyclophosphamide, other medications, foods, dyes, or preservatives Pregnant or trying to get pregnant Breast-feeding How should I use this medication? This medication is injected into a vein. It is given by your care team in a hospital or clinic setting. Talk to your care team about the use of this medication in children. Special care may be needed. Overdosage: If you think you have taken too much of this medicine contact a poison control center or emergency room at once. NOTE: This medicine is only for you. Do not share this medicine with others. What if I miss a  dose? Keep appointments for follow-up doses. It is important not to miss your dose. Call your care team if you are unable to keep an appointment. What may interact with this medication? Amphotericin B Amiodarone Azathioprine Certain antivirals for HIV or hepatitis Certain medications for blood pressure, such as enalapril, lisinopril, quinapril Cyclosporine Diuretics Etanercept Indomethacin Medications that relax muscles Metronidazole Natalizumab Tamoxifen Warfarin This list may not describe all possible interactions. Give your health care provider a list of all the medicines, herbs, non-prescription drugs, or dietary supplements you use. Also tell them if you smoke, drink alcohol, or use illegal drugs. Some items may interact with your medicine. What should I watch for while using this medication? This medication may make you feel generally unwell. This is not uncommon as chemotherapy can affect healthy cells as well as cancer cells. Report any side effects. Continue your course of treatment even though you feel ill unless your care team tells you to stop. You may need blood work while you are taking this medication. This medication may increase your risk of getting an  infection. Call your care team for advice if you get a fever, chills, sore throat, or other symptoms of a cold or flu. Do not treat yourself. Try to avoid being around people who are sick. Avoid taking medications that contain aspirin, acetaminophen, ibuprofen, naproxen, or ketoprofen unless instructed by your care team. These medications may hide a fever. Be careful brushing or flossing your teeth or using a toothpick because you may get an infection or bleed more easily. If you have any dental work done, tell your dentist you are receiving this medication. Drink water or other fluids as directed. Urinate often, even at night. Some products may contain alcohol. Ask your care team if this medication contains alcohol. Be sure  to tell all care teams you are taking this medicine. Certain medicines, like metronidazole and disulfiram, can cause an unpleasant reaction when taken with alcohol. The reaction includes flushing, headache, nausea, vomiting, sweating, and increased thirst. The reaction can last from 30 minutes to several hours. Talk to your care team if you wish to become pregnant or think you might be pregnant. This medication can cause serious birth defects if taken during pregnancy and for 1 year after the last dose. A negative pregnancy test is required before starting this medication. A reliable form of contraception is recommended while taking this medication and for 1 year after the last dose. Talk to your care team about reliable forms of contraception. Do not father a child while taking this medication and for 4 months after the last dose. Use a condom during this time period. Do not breast-feed while taking this medication or for 1 week after the last dose. This medication may cause infertility. Talk to your care team if you are concerned about your fertility. Talk to your care team about your risk of cancer. You may be more at risk for certain types of cancer if you take this medication. What side effects may I notice from receiving this medication? Side effects that you should report to your care team as soon as possible: Allergic reactions--skin rash, itching, hives, swelling of the face, lips, tongue, or throat Dry cough, shortness of breath or trouble breathing Heart failure--shortness of breath, swelling of the ankles, feet, or hands, sudden weight gain, unusual weakness or fatigue Heart muscle inflammation--unusual weakness or fatigue, shortness of breath, chest pain, fast or irregular heartbeat, dizziness, swelling of the ankles, feet, or hands Heart rhythm changes--fast or irregular heartbeat, dizziness, feeling faint or lightheaded, chest pain, trouble breathing Infection--fever, chills, cough, sore  throat, wounds that don't heal, pain or trouble when passing urine, general feeling of discomfort or being unwell Kidney injury--decrease in the amount of urine, swelling of the ankles, hands, or feet Liver injury--right upper belly pain, loss of appetite, nausea, light-colored stool, dark yellow or brown urine, yellowing skin or eyes, unusual weakness or fatigue Low red blood cell level--unusual weakness or fatigue, dizziness, headache, trouble breathing Low sodium level--muscle weakness, fatigue, dizziness, headache, confusion Red or dark brown urine Unusual bruising or bleeding Side effects that usually do not require medical attention (report to your care team if they continue or are bothersome): Hair loss Irregular menstrual cycles or spotting Loss of appetite Nausea Pain, redness, or swelling with sores inside the mouth or throat Vomiting This list may not describe all possible side effects. Call your doctor for medical advice about side effects. You may report side effects to FDA at 1-800-FDA-1088. Where should I keep my medication? This medication is given in a  hospital or clinic. It will not be stored at home. NOTE: This sheet is a summary. It may not cover all possible information. If you have questions about this medicine, talk to your doctor, pharmacist, or health care provider.  2024 Elsevier/Gold Standard (2022-02-05 00:00:00)

## 2023-04-25 ENCOUNTER — Inpatient Hospital Stay: Payer: Managed Care, Other (non HMO)

## 2023-04-25 VITALS — BP 104/78 | HR 82 | Temp 99.3°F | Resp 16

## 2023-04-25 DIAGNOSIS — C50311 Malignant neoplasm of lower-inner quadrant of right female breast: Secondary | ICD-10-CM | POA: Diagnosis not present

## 2023-04-25 MED ORDER — PEGFILGRASTIM-JMDB 6 MG/0.6ML ~~LOC~~ SOSY
6.0000 mg | PREFILLED_SYRINGE | Freq: Once | SUBCUTANEOUS | Status: AC
  Administered 2023-04-25: 6 mg via SUBCUTANEOUS
  Filled 2023-04-25: qty 0.6

## 2023-04-29 ENCOUNTER — Telehealth: Payer: Self-pay

## 2023-04-29 ENCOUNTER — Emergency Department (HOSPITAL_COMMUNITY): Payer: Managed Care, Other (non HMO)

## 2023-04-29 ENCOUNTER — Encounter: Payer: Self-pay | Admitting: Hematology and Oncology

## 2023-04-29 ENCOUNTER — Other Ambulatory Visit: Payer: Self-pay

## 2023-04-29 ENCOUNTER — Inpatient Hospital Stay (HOSPITAL_COMMUNITY)
Admission: EM | Admit: 2023-04-29 | Discharge: 2023-05-06 | DRG: 809 | Disposition: A | Discharge status: Home / Self Care | Payer: Managed Care, Other (non HMO) | Attending: Family Medicine | Admitting: Family Medicine

## 2023-04-29 DIAGNOSIS — Z820 Family history of epilepsy and other diseases of the nervous system: Secondary | ICD-10-CM

## 2023-04-29 DIAGNOSIS — Z833 Family history of diabetes mellitus: Secondary | ICD-10-CM

## 2023-04-29 DIAGNOSIS — D709 Neutropenia, unspecified: Principal | ICD-10-CM | POA: Diagnosis present

## 2023-04-29 DIAGNOSIS — R6510 Systemic inflammatory response syndrome (SIRS) of non-infectious origin without acute organ dysfunction: Secondary | ICD-10-CM | POA: Diagnosis present

## 2023-04-29 DIAGNOSIS — Z90710 Acquired absence of both cervix and uterus: Secondary | ICD-10-CM

## 2023-04-29 DIAGNOSIS — D701 Agranulocytosis secondary to cancer chemotherapy: Secondary | ICD-10-CM | POA: Diagnosis not present

## 2023-04-29 DIAGNOSIS — T451X5A Adverse effect of antineoplastic and immunosuppressive drugs, initial encounter: Secondary | ICD-10-CM | POA: Diagnosis not present

## 2023-04-29 DIAGNOSIS — D84821 Immunodeficiency due to drugs: Secondary | ICD-10-CM | POA: Diagnosis present

## 2023-04-29 DIAGNOSIS — Z8249 Family history of ischemic heart disease and other diseases of the circulatory system: Secondary | ICD-10-CM

## 2023-04-29 DIAGNOSIS — C50911 Malignant neoplasm of unspecified site of right female breast: Secondary | ICD-10-CM | POA: Diagnosis present

## 2023-04-29 DIAGNOSIS — Z885 Allergy status to narcotic agent status: Secondary | ICD-10-CM

## 2023-04-29 DIAGNOSIS — X58XXXA Exposure to other specified factors, initial encounter: Secondary | ICD-10-CM | POA: Diagnosis present

## 2023-04-29 DIAGNOSIS — Z171 Estrogen receptor negative status [ER-]: Secondary | ICD-10-CM

## 2023-04-29 DIAGNOSIS — R5081 Fever presenting with conditions classified elsewhere: Secondary | ICD-10-CM | POA: Diagnosis present

## 2023-04-29 DIAGNOSIS — F419 Anxiety disorder, unspecified: Secondary | ICD-10-CM | POA: Diagnosis present

## 2023-04-29 DIAGNOSIS — Z79899 Other long term (current) drug therapy: Secondary | ICD-10-CM

## 2023-04-29 DIAGNOSIS — Z888 Allergy status to other drugs, medicaments and biological substances status: Secondary | ICD-10-CM

## 2023-04-29 DIAGNOSIS — K649 Unspecified hemorrhoids: Secondary | ICD-10-CM | POA: Diagnosis present

## 2023-04-29 DIAGNOSIS — D61810 Antineoplastic chemotherapy induced pancytopenia: Secondary | ICD-10-CM | POA: Diagnosis present

## 2023-04-29 DIAGNOSIS — Z823 Family history of stroke: Secondary | ICD-10-CM

## 2023-04-29 DIAGNOSIS — R531 Weakness: Secondary | ICD-10-CM | POA: Diagnosis present

## 2023-04-29 DIAGNOSIS — Z7969 Long term (current) use of other immunomodulators and immunosuppressants: Secondary | ICD-10-CM

## 2023-04-29 DIAGNOSIS — Z1152 Encounter for screening for COVID-19: Secondary | ICD-10-CM

## 2023-04-29 DIAGNOSIS — Z91040 Latex allergy status: Secondary | ICD-10-CM

## 2023-04-29 LAB — CBC WITH DIFFERENTIAL/PLATELET
Abs Immature Granulocytes: 0 10*3/uL (ref 0.00–0.07)
Basophils Absolute: 0 10*3/uL (ref 0.0–0.1)
Basophils Relative: 0 %
Eosinophils Absolute: 0 10*3/uL (ref 0.0–0.5)
Eosinophils Relative: 4 %
HCT: 24.3 % — ABNORMAL LOW (ref 36.0–46.0)
Hemoglobin: 8.2 g/dL — ABNORMAL LOW (ref 12.0–15.0)
Immature Granulocytes: 0 %
Lymphocytes Relative: 92 %
Lymphs Abs: 0.5 10*3/uL — ABNORMAL LOW (ref 0.7–4.0)
MCH: 31.5 pg (ref 26.0–34.0)
MCHC: 33.7 g/dL (ref 30.0–36.0)
MCV: 93.5 fL (ref 80.0–100.0)
Monocytes Absolute: 0 10*3/uL — ABNORMAL LOW (ref 0.1–1.0)
Monocytes Relative: 2 %
Neutro Abs: 0 10*3/uL — CL (ref 1.7–7.7)
Neutrophils Relative %: 2 %
Platelets: 61 10*3/uL — ABNORMAL LOW (ref 150–400)
RBC: 2.6 MIL/uL — ABNORMAL LOW (ref 3.87–5.11)
RDW: 13.9 % (ref 11.5–15.5)
WBC: 0.5 10*3/uL — CL (ref 4.0–10.5)
nRBC: 0 % (ref 0.0–0.2)

## 2023-04-29 LAB — COMPREHENSIVE METABOLIC PANEL
ALT: 13 U/L (ref 0–44)
AST: 14 U/L — ABNORMAL LOW (ref 15–41)
Albumin: 4.1 g/dL (ref 3.5–5.0)
Alkaline Phosphatase: 60 U/L (ref 38–126)
Anion gap: 8 (ref 5–15)
BUN: 10 mg/dL (ref 6–20)
CO2: 24 mmol/L (ref 22–32)
Calcium: 9 mg/dL (ref 8.9–10.3)
Chloride: 103 mmol/L (ref 98–111)
Creatinine, Ser: 0.53 mg/dL (ref 0.44–1.00)
GFR, Estimated: >60 mL/min (ref >60)
Glucose, Bld: 103 mg/dL — ABNORMAL HIGH (ref 70–99)
Potassium: 3.6 mmol/L (ref 3.5–5.1)
Sodium: 135 mmol/L (ref 135–145)
Total Bilirubin: 0.7 mg/dL (ref 0.3–1.2)
Total Protein: 7.6 g/dL (ref 6.5–8.1)

## 2023-04-29 LAB — URINALYSIS, ROUTINE W REFLEX MICROSCOPIC
Bacteria, UA: NONE SEEN
Bilirubin Urine: NEGATIVE
Glucose, UA: NEGATIVE mg/dL
Ketones, ur: NEGATIVE mg/dL
Leukocytes,Ua: NEGATIVE
Nitrite: NEGATIVE
Protein, ur: NEGATIVE mg/dL
Specific Gravity, Urine: 1.002 — ABNORMAL LOW (ref 1.005–1.030)
pH: 8 (ref 5.0–8.0)

## 2023-04-29 LAB — PROTIME-INR
INR: 1 (ref 0.8–1.2)
Prothrombin Time: 13.4 seconds (ref 11.4–15.2)

## 2023-04-29 LAB — APTT: aPTT: 31 seconds (ref 24–36)

## 2023-04-29 LAB — LIPASE, BLOOD: Lipase: 45 U/L (ref 11–51)

## 2023-04-29 LAB — LACTIC ACID, PLASMA: Lactic Acid, Venous: 0.8 mmol/L (ref 0.5–1.9)

## 2023-04-29 LAB — I-STAT CG4 LACTIC ACID, ED: Lactic Acid, Venous: 0.4 mmol/L — ABNORMAL LOW (ref 0.5–1.9)

## 2023-04-29 MED ORDER — MELATONIN 5 MG PO TABS: 5.0000 mg | ORAL_TABLET | Freq: Every evening | ORAL | Status: DC | PRN

## 2023-04-29 MED ORDER — VANCOMYCIN HCL 750 MG/150ML IV SOLN
750.0000 mg | Freq: Two times a day (BID) | INTRAVENOUS | Status: DC
  Filled 2023-04-29: qty 150

## 2023-04-29 MED ORDER — ACETAMINOPHEN 325 MG PO TABS
650.0000 mg | ORAL_TABLET | Freq: Four times a day (QID) | ORAL | Status: DC | PRN
  Administered 2023-04-29 – 2023-05-06 (×19): 650 mg via ORAL
  Filled 2023-04-29 (×20): qty 2

## 2023-04-29 MED ORDER — POLYETHYLENE GLYCOL 3350 17 G PO PACK: 17.0000 g | PACK | Freq: Every day | ORAL | Status: DC | PRN

## 2023-04-29 MED ORDER — SODIUM CHLORIDE 0.9 % IV SOLN
2.0000 g | Freq: Once | INTRAVENOUS | Status: AC
  Administered 2023-04-29: 2 g via INTRAVENOUS
  Filled 2023-04-29: qty 12.5

## 2023-04-29 MED ORDER — PROCHLORPERAZINE EDISYLATE 10 MG/2ML IJ SOLN
5.0000 mg | Freq: Four times a day (QID) | INTRAMUSCULAR | Status: DC | PRN
  Administered 2023-05-02: 5 mg via INTRAVENOUS
  Filled 2023-04-29: qty 2

## 2023-04-29 MED ORDER — SODIUM CHLORIDE 0.9 % IV SOLN
2.0000 g | Freq: Three times a day (TID) | INTRAVENOUS | Status: DC
  Administered 2023-04-30 – 2023-05-05 (×17): 2 g via INTRAVENOUS
  Filled 2023-04-29 (×17): qty 12.5

## 2023-04-29 MED ORDER — SODIUM CHLORIDE 0.9 % IV BOLUS (SEPSIS)
1000.0000 mL | Freq: Once | INTRAVENOUS | Status: AC
  Administered 2023-04-29: 1000 mL via INTRAVENOUS

## 2023-04-29 MED ORDER — VANCOMYCIN HCL IN DEXTROSE 1-5 GM/200ML-% IV SOLN
1000.0000 mg | Freq: Once | INTRAVENOUS | Status: AC
  Administered 2023-04-29: 1000 mg via INTRAVENOUS
  Filled 2023-04-29: qty 200

## 2023-04-29 MED ORDER — ENOXAPARIN SODIUM 40 MG/0.4ML IJ SOSY
40.0000 mg | PREFILLED_SYRINGE | INTRAMUSCULAR | Status: DC
  Administered 2023-04-29 – 2023-04-30 (×2): 40 mg via SUBCUTANEOUS
  Filled 2023-04-29 (×2): qty 0.4

## 2023-04-29 MED ORDER — HYDROXYZINE HCL 10 MG PO TABS: 10.0000 mg | ORAL_TABLET | Freq: Three times a day (TID) | ORAL | Status: DC | PRN

## 2023-04-29 MED ORDER — LACTATED RINGERS IV SOLN: INTRAVENOUS | Status: DC

## 2023-04-29 MED ORDER — OXYCODONE HCL 5 MG PO TABS
5.0000 mg | ORAL_TABLET | Freq: Four times a day (QID) | ORAL | Status: DC | PRN
  Filled 2023-04-29: qty 1

## 2023-04-29 MED ORDER — SODIUM CHLORIDE 0.9 % IV SOLN: INTRAVENOUS | Status: AC

## 2023-04-29 NOTE — H&P (Signed)
History and Physical  Suzanne Tucker RUE:[id number] DOB: [birth year] DOA: 04/29/2023  Referring physician: Dr. Lockie Mola, EDP  PCP: Serena Croissant, MD  Outpatient Specialists: Medical oncology, Dr. [name] Patient coming from: Home  Chief Complaint: Fever at home   HPI: Suzanne Tucker is a 42 y.o. female with medical history significant for recent diagnosis of right breast cancer on chemotherapy, she was started on a new chemotherapy on Friday a week ago, endorses a subjective fever today at home with Tmax 100.6.  She took Tylenol.  Associated with generalized weakness fatigue.  She received G-CSF Neulasta on 04/22/2023 for prevention of chemotherapy-induced neutropenia.  The patient is concerned for neutropenic fever.  She initially presented to the cancer center and was sent to [facility]  ED for further evaluation.  In the ED, the patient had a low-grade temperature 99.7.  UA was negative for pyuria.  Chest x-ray was nonacute.  Due to concern for neutropenic fever, cultures were ordered and the patient was started on broad-spectrum IV antibiotics cefepime and IV vancomycin.  TRH, hospitalist service was asked to admit.  Medical oncology consulted.  Discussed with Dr. [name], the patient received Neulasta so her count will recover normally in the next 2 days.  If ANC is over 1000 and no fever she can go home if workup is negative.  Admitted to telemetry medical unit as observation status.  Neutropenic precautions ordered.   ED Course: Tmax 99.7.  BP 123/79, pulse 106, respiratory 25, saturation 100% on room air.  Lab studies notable for WBC 0.5, hemoglobin 8.2, platelet count 61, neutrophil count 0.0.  Review of Systems: Review of systems as noted in the HPI. All other systems reviewed and are negative.   Past Medical History:  Diagnosis Date   Anginal pain (HCC) 02/2019   related to MVP   Back pain    Breast cancer (HCC)    Depression    Dysrhythmia    MVP   Mitral prolapse    Past  Surgical History:  Procedure Laterality Date   BREAST SURGERY Right 2024   Breast biopsy x 2   CESAREAN SECTION  03/30/2008   PORTACATH PLACEMENT N/A 01/27/2023   Procedure: INSERTION PORT-A-CATH;  Surgeon: [name], MD;  Location: [facility];  Service: General;  Laterality: N/A;  leaving accessed   ROBOTIC ASSISTED TOTAL HYSTERECTOMY Bilateral 03/15/2019   Procedure: XI ROBOTIC ASSISTED TOTAL HYSTERECTOMY with Bilateral Salpingectomy;  Surgeon: Silverio Lay, MD;  Location: [facility];  Service: Gynecology;  Laterality: Bilateral;   TUBAL LIGATION  2017    Social History:  reports that she has never smoked. She has never used smokeless tobacco. She reports that she does not currently use alcohol. She reports that she does not use drugs.   Allergies  Allergen Reactions   Morphine Anaphylaxis and Shortness Of Breath   Tessalon [Benzonatate] Hives    Hives, itching, burning all over.    Hydrocodone Hives   Latex Rash   Antihistamines, Loratadine-Type Other (See Comments)    Patient reports numbness in tongue   Codeine Nausea And Vomiting   Tramadol Other (See Comments)    Tingling in the mouth with swelling     Family History  Problem Relation Age of Onset   Stroke Mother    Alzheimer's disease Father    Diabetes Maternal Grandmother    Heart disease Maternal Grandmother    Heart attack Maternal Grandmother    Diabetes Maternal Grandfather    Heart disease Maternal Grandfather  Alzheimer's disease Paternal Grandmother    Dementia Paternal Grandmother    Alzheimer's disease Paternal Grandfather    Dementia Paternal Grandfather    Alcohol abuse Paternal Grandfather       Prior to Admission medications   Medication Sig Start Date End Date Taking? Authorizing Provider  acetaminophen (TYLENOL) 500 MG tablet Take 1,000 mg by mouth in the morning and at bedtime.    [provider]  calcium carbonate (TUMS - DOSED IN MG ELEMENTAL CALCIUM) 500 MG  chewable tablet Chew 1,000 mg by mouth as needed for indigestion or heartburn.    [provider]  dexamethasone (DECADRON) 4 MG tablet Take 1 tablet (4 mg total) by mouth daily. Take with Adriamycin 1 tablet day after chemo and 1 tablet 2 days after chemo with food 01/19/23   Serena Croissant, MD  fexofenadine (ALLEGRA) 180 MG tablet Take 180 mg by mouth as needed for allergies or rhinitis.    [provider]  fluticasone (FLONASE) 50 MCG/ACT nasal spray Place 2 sprays into both nostrils daily. 12/02/22   Leath-Warren, Sadie Haber, NP  lidocaine-prilocaine (EMLA) cream Apply to affected area once 01/19/23   Serena Croissant, MD  ondansetron (ZOFRAN) 8 MG tablet Take 1 tablet (8 mg total) by mouth every 8 (eight) hours as needed for nausea or vomiting. Start on the third day after chemotherapy. 01/19/23   Serena Croissant, MD  prochlorperazine (COMPAZINE) 10 MG tablet Take 1 tablet (10 mg total) by mouth every 6 (six) hours as needed for nausea or vomiting. 01/19/23   Serena Croissant, MD    Physical Exam: BP 117/77   Pulse 97   Temp 99.5 F (37.5 C) (Oral)   Resp 19   Ht 5\' 1"  (1.549 m)   Wt 64.4 kg   SpO2 100%   BMI 26.83 kg/m   General: 42 y.o. year-old female well developed well nourished in no acute distress.  Alert and oriented x3. Cardiovascular: Regular rate and rhythm with no rubs or gallops.  No thyromegaly or JVD noted.  No lower extremity edema. 2/4 pulses in all 4 extremities. Respiratory: Clear to auscultation with no wheezes or rales. Good inspiratory effort. Abdomen: Soft nontender nondistended with normal bowel sounds x4 quadrants. Muskuloskeletal: No cyanosis, clubbing or edema noted bilaterally Neuro: CN II-XII intact, strength, sensation, reflexes Skin: No ulcerative lesions noted or rashes Psychiatry: Judgement and insight appear normal. Mood is appropriate for condition and setting          Labs on Admission:  Basic Metabolic Panel: Recent Labs  Lab  04/29/23 1930  NA 135  K 3.6  CL 103  CO2 24  GLUCOSE 103*  BUN 10  CREATININE 0.53  CALCIUM 9.0   Liver Function Tests: Recent Labs  Lab 04/29/23 1930  AST 14*  ALT 13  ALKPHOS 60  BILITOT 0.7  PROT 7.6  ALBUMIN 4.1   Recent Labs  Lab 04/29/23 1930  LIPASE 45   No results for input(s): "AMMONIA" in the last 168 hours. CBC: Recent Labs  Lab 04/29/23 1930  WBC 0.5*  NEUTROABS 0.0*  HGB 8.2*  HCT 24.3*  MCV 93.5  PLT 61*   Cardiac Enzymes: No results for input(s): "CKTOTAL", "CKMB", "CKMBINDEX", "TROPONINI" in the last 168 hours.  BNP (last 3 results) No results for input(s): "BNP" in the last 8760 hours.  ProBNP (last 3 results) No results for input(s): "PROBNP" in the last 8760 hours.  CBG: No results for input(s): "GLUCAP" in the last 168  hours.  Radiological Exams on Admission: DG Chest Portable 1 View  Result Date: 04/29/2023 CLINICAL DATA:  Fever. EXAM: PORTABLE CHEST 1 VIEW COMPARISON:  January 30, 2023. FINDINGS: The heart size and mediastinal contours are within normal limits. Left subclavian Port-A-Cath is unchanged in position. Both lungs are clear. The visualized skeletal structures are unremarkable. IMPRESSION: No active disease. Electronically Signed   By: Lupita Raider M.D.   On: 04/29/2023 17:47    EKG: I independently viewed the EKG done and my findings are as followed: Sinus rhythm rate of 97.  Nonspecific ST changes.  QTc 426.  Assessment/Plan Present on Admission:  Neutropenia (HCC)  Principal Problem:   Neutropenia (HCC)  Severe neutropenia with reported subjective fevers at home with Tmax 100.6. SIRS, no obvious source of infection Follow cultures Received Neulasta a few days ago Presented with WBC 0.5, neutrophil count 0.0 Repeat CBC in the AM Medical oncology, Dr. [name] consulted, recommendations as stated above. Neutropenic precautions ordered Continue empiric IV antibiotics Follow cultures.  Pancytopenia likely  secondary to recent chemotherapy Monitor No reported overt bleeding. Transfuse hemoglobin as indicated  Generalized weakness Encourage increase in oral protein calorie intake Likely in the setting of malignancy on chemotherapy  Situational anxiety As needed hydroxyzine   Time: 75 minutes.   DVT prophylaxis: Subcu Lovenox daily  Code Status: Full code  Family Communication: None at bedside  Disposition Plan: Admitted to telemetry unit  Consults called: Medical oncology  Admission status: Observation status   Status is: Observation    Darlin Drop MD [facility] Pager [phone]  If 7PM-7AM, please contact night-coverage [email or web address] Password [facility]  04/29/2023, 9:29 PM

## 2023-04-29 NOTE — Telephone Encounter (Signed)
Pt arrived to [facility] c/o 100.6 oral temperature. She reports overall feeling weak and tired but no other sx. Received G- CSF 7/19 and hasn't felt well since beginning new tx. She is concerned for neutropenic fever. Per [facility] pt was sent to ED for further eval and fever workup. She reports she took tylenol about 30 min ago. Called report to Sheria Lang, RN in [place] ED.

## 2023-04-29 NOTE — ED Provider Notes (Signed)
[place] EMERGENCY DEPARTMENT AT [facility] Provider Note   CSN: [id number] Arrival date & time: 04/29/23  1617     History  Chief Complaint  Patient presents with   Fever    Oncology patient    Suzanne Tucker is a 42 y.o. female.  Patient here with fever.  Breast cancer patient with triple negative breast cancer.  Last chemotherapy was on Friday last week.  She has already completed a cycle of chemotherapy prior to this but this was a new medication she started last Friday.  She had a booster shot on Monday for white blood cell count.  Developed a fever today 100.6 at home.  Took Tylenol before coming here.  She had a little bit of diarrhea but otherwise has felt well.  Denies any abdominal pain cough or sputum production  The history is provided by the patient.       Home Medications Prior to Admission medications   Medication Sig Start Date End Date Taking? Authorizing Provider  acetaminophen (TYLENOL) 500 MG tablet Take 1,000 mg by mouth in the morning and at bedtime.    [provider]  calcium carbonate (TUMS - DOSED IN MG ELEMENTAL CALCIUM) 500 MG chewable tablet Chew 1,000 mg by mouth as needed for indigestion or heartburn.    [provider]  dexamethasone (DECADRON) 4 MG tablet Take 1 tablet (4 mg total) by mouth daily. Take with Adriamycin 1 tablet day after chemo and 1 tablet 2 days after chemo with food 01/19/23   Serena Croissant, MD  fexofenadine (ALLEGRA) 180 MG tablet Take 180 mg by mouth as needed for allergies or rhinitis.    [provider]  fluticasone (FLONASE) 50 MCG/ACT nasal spray Place 2 sprays into both nostrils daily. 12/02/22   Leath-Warren, Sadie Haber, NP  lidocaine-prilocaine (EMLA) cream Apply to affected area once 01/19/23   Serena Croissant, MD  ondansetron (ZOFRAN) 8 MG tablet Take 1 tablet (8 mg total) by mouth every 8 (eight) hours as needed for nausea or vomiting. Start on the third day after chemotherapy.  01/19/23   Serena Croissant, MD  prochlorperazine (COMPAZINE) 10 MG tablet Take 1 tablet (10 mg total) by mouth every 6 (six) hours as needed for nausea or vomiting. 01/19/23   Serena Croissant, MD      Allergies    Morphine; Tessalon [benzonatate]; Hydrocodone; Latex; Antihistamines, loratadine-type; Codeine; and Tramadol    Review of Systems   Review of Systems  Physical Exam Updated Vital Signs BP 117/77   Pulse 97   Temp 99.5 F (37.5 C) (Oral)   Resp 19   Ht 5\' 1"  (1.549 m)   Wt 64.4 kg   SpO2 100%   BMI 26.83 kg/m  Physical Exam Vitals and nursing note reviewed.  Constitutional:      General: She is not in acute distress.    Appearance: She is well-developed. She is not ill-appearing.  HENT:     Head: Normocephalic and atraumatic.     Nose: Nose normal.     Mouth/Throat:     Mouth: Mucous membranes are moist.  Eyes:     Extraocular Movements: Extraocular movements intact.     Conjunctiva/sclera: Conjunctivae normal.     Pupils: Pupils are equal, round, and reactive to light.  Cardiovascular:     Rate and Rhythm: Regular rhythm. Tachycardia present.     Pulses: Normal pulses.     Heart sounds: Normal heart sounds. No murmur heard. Pulmonary:  Effort: Pulmonary effort is normal. No respiratory distress.     Breath sounds: Normal breath sounds.  Abdominal:     Palpations: Abdomen is soft.     Tenderness: There is no abdominal tenderness.  Musculoskeletal:        General: No swelling.     Cervical back: Normal range of motion and neck supple.  Skin:    General: Skin is warm and dry.     Capillary Refill: Capillary refill takes less than 2 seconds.  Neurological:     General: No focal deficit present.     Mental Status: She is alert.  Psychiatric:        Mood and Affect: Mood normal.     ED Results / Procedures / Treatments   Labs (all labs ordered are listed, but only abnormal results are displayed) Labs Reviewed  CBC WITH DIFFERENTIAL/PLATELET - Abnormal;  Notable for the following components:      Result Value   WBC 0.5 (*)    RBC 2.60 (*)    Hemoglobin 8.2 (*)    HCT 24.3 (*)    Platelets 61 (*)    Neutro Abs 0.0 (*)    Lymphs Abs 0.5 (*)    Monocytes Absolute 0.0 (*)    All other components within normal limits  COMPREHENSIVE METABOLIC PANEL - Abnormal; Notable for the following components:   Glucose, Bld 103 (*)    AST 14 (*)    All other components within normal limits  URINALYSIS, ROUTINE W REFLEX MICROSCOPIC - Abnormal; Notable for the following components:   Color, Urine COLORLESS (*)    Specific Gravity, Urine 1.002 (*)    Hgb urine dipstick SMALL (*)    All other components within normal limits  I-STAT CG4 LACTIC ACID, ED - Abnormal; Notable for the following components:   Lactic Acid, Venous 0.4 (*)    All other components within normal limits  CULTURE, BLOOD (ROUTINE X 2)  CULTURE, BLOOD (ROUTINE X 2)  RESP PANEL BY RT-PCR (RSV, FLU A&B, COVID)  [id number]  LIPASE, BLOOD  LACTIC ACID, PLASMA  PROTIME-INR  APTT  LACTIC ACID, PLASMA  I-STAT CG4 LACTIC ACID, ED  I-STAT CG4 LACTIC ACID, ED    EKG EKG Interpretation Date/Time:  Friday April 29 2023 17:40:44 EDT Ventricular Rate:  97 PR Interval:  116 QRS Duration:  85 QT Interval:  335 QTC Calculation: 426 R Axis:   76  Text Interpretation: Sinus rhythm Confirmed by Virgina Norfolk [phone]) on 04/29/2023 5:51:45 PM  Radiology DG Chest Portable 1 View  Result Date: 04/29/2023 CLINICAL DATA:  Fever. EXAM: PORTABLE CHEST 1 VIEW COMPARISON:  January 30, 2023. FINDINGS: The heart size and mediastinal contours are within normal limits. Left subclavian Port-A-Cath is unchanged in position. Both lungs are clear. The visualized skeletal structures are unremarkable. IMPRESSION: No active disease. Electronically Signed   By: Lupita Raider M.D.   On: 04/29/2023 17:47    Procedures Procedures    Medications Ordered in ED Medications  vancomycin (VANCOCIN) IVPB 1000 mg/200 mL  premix (1,000 mg Intravenous New Bag/Given 04/29/23 2052)  sodium chloride 0.9 % bolus 1,000 mL (1,000 mLs Intravenous New Bag/Given 04/29/23 1955)  ceFEPIme (MAXIPIME) 2 g in sodium chloride 0.9 % 100 mL IVPB (0 g Intravenous Stopped 04/29/23 2024)    ED Course/ Medical Decision Making/ A&P  Medical Decision Making Amount and/or Complexity of Data Reviewed Labs: ordered. Radiology: ordered.  Risk Prescription drug management. Decision regarding hospitalization.   Suzanne Tucker is here with fever.  History of breast cancer on chemotherapy.  Patient arrives with temperature of 100.  Tachycardic.  Had a fever 100.6 at home.  Took Tylenol before coming here.  History of triple negative breast cancer.  Started new chemotherapy last week on Friday.  Had a white cell booster, on Monday she states.  But fever started today.  She is otherwise feeling well.  She has had some diarrhea.  She has had some flulike symptoms last few days otherwise.  Overall differential diagnosis concerning for neutropenic fever.  Will get sepsis workup with blood cultures and start her on broad-spectrum IV antibiotics.  Will give IV fluids.  Per my review interpretation of labs are consistent with neutropenic fever.  WBC 0.5, absolute neutrophil 0.  Platelets are 61, hemoglobin 3.2.  Otherwise electrolytes are unremarkable, no pneumonia on chest x-ray per my review and interpretation.  Lactic acid is normal.  Overall she is hemodynamically stable.  Will admit her overnight to medicine team for further trend cultures.  She understands the plan.  This chart was dictated using voice recognition software.  Despite best efforts to proofread,  errors can occur which can change the documentation meaning.         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

## 2023-04-29 NOTE — ED Triage Notes (Signed)
Pt reports new onset fever today with hx of breast cancer. Started a new chemo treatment last Friday. Has had some intermittent diarrhea with one episode of dizziness today.

## 2023-04-29 NOTE — Sepsis Progress Note (Addendum)
eLink is following this Code Sepsis.  1830 Attempting to reach bedside staff to check on status of this patient's code sepsis work.

## 2023-04-29 NOTE — Progress Notes (Signed)
A consult was received from an ED physician for Vanco, Cefepime per pharmacy dosing.  The patient's profile has been reviewed for ht/wt/allergies/indication/available labs.   A one time order has been placed for Vanco 1g IV x 1, Cefepime 2g IV x 1.  Further antibiotics/pharmacy consults should be ordered by admitting physician if indicated.              Kamira Mellette S. Merilynn Finland, PharmD, BCPS Clinical Staff Pharmacist [email or web address]           Thank you, Pasty Spillers 04/29/2023  5:04 PM

## 2023-04-29 NOTE — ED Notes (Signed)
ED TO INPATIENT HANDOFF REPORT  ED Nurse Name and Phone #: Antoine Primas RN  S Name/Age/Gender Suzanne Tucker 42 y.o. female Room/Bed: WA10/WA10  Code Status   Code Status: Full Code  Home/SNF/Other Home Patient oriented to: self, place, time, and situation Is this baseline? Yes   Triage Complete: Triage complete  Chief Complaint Neutropenia (HCC) [D70.9]  Triage Note Pt reports new onset fever today with hx of breast cancer. Started a new chemo treatment last Friday. Has had some intermittent diarrhea with one episode of dizziness today.   Allergies Allergies  Allergen Reactions   Morphine Anaphylaxis and Shortness Of Breath   Tessalon [Benzonatate] Hives    Hives, itching, burning all over.    Hydrocodone Hives   Latex Rash   Antihistamines, Loratadine-Type Other (See Comments)    Patient reports numbness in tongue   Codeine Nausea And Vomiting   Tramadol Other (See Comments)    Tingling in the mouth with swelling     Level of Care/Admitting Diagnosis ED Disposition     ED Disposition  Admit   Condition  --   Comment  Hospital Area: [facility] [[id number]]  Level of Care: Telemetry [5]  Admit to tele based on following criteria: Monitor for Ischemic changes  May place patient in observation at [facility] or [facility] if equivalent level of care is available:: No  Covid Evaluation: Asymptomatic - no recent exposure (last 10 days) testing not required  Diagnosis: Neutropenia [facility]) [[id number]]  Admitting Physician: Darlin Drop [[id number]]  Attending Physician: Darlin Drop [[id number]]          B Medical/Surgery History Past Medical History:  Diagnosis Date   Anginal pain (HCC) 02/2019   related to MVP   Back pain    Breast cancer (HCC)    Depression    Dysrhythmia    MVP   Mitral prolapse    Past Surgical History:  Procedure Laterality Date   BREAST SURGERY Right 2024   Breast biopsy x 2   CESAREAN SECTION  03/30/2008    PORTACATH PLACEMENT N/A 01/27/2023   Procedure: INSERTION PORT-A-CATH;  Surgeon: [name], MD;  Location: [facility];  Service: General;  Laterality: N/A;  leaving accessed   ROBOTIC ASSISTED TOTAL HYSTERECTOMY Bilateral 03/15/2019   Procedure: XI ROBOTIC ASSISTED TOTAL HYSTERECTOMY with Bilateral Salpingectomy;  Surgeon: Silverio Lay, MD;  Location: [facility];  Service: Gynecology;  Laterality: Bilateral;   TUBAL LIGATION  2017     A IV Location/Drains/Wounds Patient Lines/Drains/Airways Status     Active Line/Drains/Airways     Name Placement date Placement time Site Days   Implanted Port 01/27/23 Left Chest 01/27/23  --  Chest  92   Peripheral IV 04/29/23 20 G 1" Left Antecubital 04/29/23  1930  Antecubital  less than 1            Intake/Output Last 24 hours  Intake/Output Summary (Last 24 hours) at 04/29/2023 2202 Last data filed at 04/29/2023 2055 Gross per 24 hour  Intake 1100 ml  Output --  Net 1100 ml    Labs/Imaging Results for orders placed or performed during the hospital encounter of 04/29/23 (from the past 48 hour(s))  Urinalysis, Routine w reflex microscopic -Urine, Clean Catch     Status: Abnormal   Collection Time: 04/29/23  6:07 PM  Result Value Ref Range   Color, Urine COLORLESS (A) YELLOW   APPearance CLEAR CLEAR   Specific Gravity, Urine 1.002 (L) 1.005 -  1.030   pH 8.0 5.0 - 8.0   Glucose, UA NEGATIVE NEGATIVE mg/dL   Hgb urine dipstick SMALL (A) NEGATIVE   Bilirubin Urine NEGATIVE NEGATIVE   Ketones, ur NEGATIVE NEGATIVE mg/dL   Protein, ur NEGATIVE NEGATIVE mg/dL   Nitrite NEGATIVE NEGATIVE   Leukocytes,Ua NEGATIVE NEGATIVE   RBC / HPF 0-5 0 - 5 RBC/hpf   WBC, UA 0-5 0 - 5 WBC/hpf   Bacteria, UA NONE SEEN NONE SEEN   Squamous Epithelial / HPF 0-5 0 - 5 /HPF    Comment: Performed at [facility], [address] [place], [place] [address]  CBC with Differential     Status: Abnormal   Collection Time:  04/29/23  7:30 PM  Result Value Ref Range   WBC 0.5 (LL) 4.0 - 10.5 K/uL    Comment: REPEATED TO VERIFY THIS CRITICAL RESULT HAS VERIFIED AND BEEN CALLED TO Nathanial Millman RN @ 2104 BY [place], LATRICE ON 07 26 2024 AT 2103, AND HAS BEEN READ BACK.     RBC 2.60 (L) 3.87 - 5.11 MIL/uL   Hemoglobin 8.2 (L) 12.0 - 15.0 g/dL   HCT 60.4 (L) 54.0 - 98.1 %   MCV 93.5 80.0 - 100.0 fL   MCH 31.5 26.0 - 34.0 pg   MCHC 33.7 30.0 - 36.0 g/dL   RDW 19.1 47.8 - 29.5 %   Platelets 61 (L) 150 - 400 K/uL    Comment: Immature Platelet Fraction may be clinically indicated, consider ordering this additional test [id number]    nRBC 0.0 0.0 - 0.2 %   Neutrophils Relative % 2 %   Neutro Abs 0.0 (LL) 1.7 - 7.7 K/uL    Comment: REPEATED TO VERIFY THIS CRITICAL RESULT HAS VERIFIED AND BEEN CALLED TO Nathanial Millman RN @ 2104 BY [place], LATRICE ON 07 26 2024 AT 2103, AND HAS BEEN READ BACK.     Lymphocytes Relative 92 %   Lymphs Abs 0.5 (L) 0.7 - 4.0 K/uL   Monocytes Relative 2 %   Monocytes Absolute 0.0 (L) 0.1 - 1.0 K/uL   Eosinophils Relative 4 %   Eosinophils Absolute 0.0 0.0 - 0.5 K/uL   Basophils Relative 0 %   Basophils Absolute 0.0 0.0 - 0.1 K/uL   Immature Granulocytes 0 %   Abs Immature Granulocytes 0.00 0.00 - 0.07 K/uL    Comment: Performed at [facility], [address], [place], [place] [address]  Comprehensive metabolic panel     Status: Abnormal   Collection Time: 04/29/23  7:30 PM  Result Value Ref Range   Sodium 135 135 - 145 mmol/L   Potassium 3.6 3.5 - 5.1 mmol/L   Chloride 103 98 - 111 mmol/L   CO2 24 22 - 32 mmol/L   Glucose, Bld 103 (H) 70 - 99 mg/dL    Comment: Glucose reference range applies only to samples taken after fasting for at least 8 hours.   BUN 10 6 - 20 mg/dL   Creatinine, Ser 6.29 0.44 - 1.00 mg/dL   Calcium 9.0 8.9 - 52.8 mg/dL   Total Protein 7.6 6.5 - 8.1 g/dL   Albumin 4.1 3.5 - 5.0 g/dL   AST 14 (L) 15 - 41 U/L   ALT 13 0 - 44 U/L    Alkaline Phosphatase 60 38 - 126 U/L   Total Bilirubin 0.7 0.3 - 1.2 mg/dL   GFR, Estimated >41 >32 mL/min    Comment: (NOTE) Calculated using the CKD-EPI Creatinine  Equation (2021)    Anion gap 8 5 - 15    Comment: Performed at [facility], [address], [place], [place] [address]  Lipase, blood     Status: None   Collection Time: 04/29/23  7:30 PM  Result Value Ref Range   Lipase 45 11 - 51 U/L    Comment: Performed at [facility], [address], [place], [place] [address]  Lactic acid, plasma     Status: None   Collection Time: 04/29/23  7:30 PM  Result Value Ref Range   Lactic Acid, Venous 0.8 0.5 - 1.9 mmol/L    Comment: Performed at [facility], [address], [place], [place] [address]  Protime-INR     Status: None   Collection Time: 04/29/23  7:30 PM  Result Value Ref Range   Prothrombin Time 13.4 11.4 - 15.2 seconds   INR 1.0 0.8 - 1.2    Comment: (NOTE) INR goal varies based on device and disease states. Performed at [facility], [address], [place], [place] [address]   APTT     Status: None   Collection Time: 04/29/23  7:30 PM  Result Value Ref Range   aPTT 31 24 - 36 seconds    Comment: Performed at [facility], [address], [place], [place] [address]  I-Stat CG4 Lactic Acid     Status: Abnormal   Collection Time: 04/29/23  7:53 PM  Result Value Ref Range   Lactic Acid, Venous 0.4 (L) 0.5 - 1.9 mmol/L   DG Chest Portable 1 View  Result Date: 04/29/2023 CLINICAL DATA:  Fever. EXAM: PORTABLE CHEST 1 VIEW COMPARISON:  January 30, 2023. FINDINGS: The heart size and mediastinal contours are within normal limits. Left subclavian Port-A-Cath is unchanged in position. Both lungs are clear. The visualized skeletal structures are unremarkable. IMPRESSION: No active disease. Electronically Signed   By: Lupita Raider M.D.   On: 04/29/2023 17:47    Pending  Labs Unresulted Labs (From admission, onward)     Start     Ordered   05/06/23 0500  Creatinine, serum  (enoxaparin (LOVENOX)    CrCl >/= 30 ml/min)  Weekly,   R     Comments: while on enoxaparin therapy    04/29/23 2126   04/30/23 0500  CBC with Differential/Platelet  Tomorrow morning,   R        04/29/23 2128   04/30/23 0500  Comprehensive metabolic panel  Tomorrow morning,   R        04/29/23 2128   04/30/23 0500  Magnesium  Tomorrow morning,   R        04/29/23 2128   04/30/23 0500  Phosphorus  Tomorrow morning,   R        04/29/23 2128   04/29/23 1830  Resp panel by RT-PCR (RSV, Flu A&B, Covid)  Once,   R        04/29/23 1830   04/29/23 1659  Blood culture (routine x 2)  BLOOD CULTURE X 2,   R      04/29/23 1659            Vitals/Pain Today's Vitals   04/29/23 2115 04/29/23 2135 04/29/23 2145 04/29/23 2200  BP: 122/83  122/84 120/88  Pulse: 98  99 (!) 108  Resp: (!) 21  20 17   Temp:  99.7 F (37.6 C)    TempSrc:  Oral  SpO2: 100%  100% 100%  Weight:      Height:      PainSc:        Isolation Precautions Protective Precautions  Medications Medications  enoxaparin (LOVENOX) injection 40 mg (has no administration in time range)  ceFEPIme (MAXIPIME) 2 g in sodium chloride 0.9 % 100 mL IVPB (has no administration in time range)  vancomycin (VANCOREADY) IVPB 750 mg/150 mL (has no administration in time range)  sodium chloride 0.9 % bolus 1,000 mL (0 mLs Intravenous Stopped 04/29/23 2055)  vancomycin (VANCOCIN) IVPB 1000 mg/200 mL premix (1,000 mg Intravenous New Bag/Given 04/29/23 2052)  ceFEPIme (MAXIPIME) 2 g in sodium chloride 0.9 % 100 mL IVPB (0 g Intravenous Stopped 04/29/23 2024)    Mobility walks     Focused Assessments Pt is undergoing chemotherapy for stage III breast cancer. Developed fever and diarrhea today. Also had an episode of dizziness. Tmax in ED was 99.7. Sepsis workup initiated. IV vancomycin infusing now. Denies pain.     R Recommendations: See Admitting Provider Note  Report given to:   Additional Notes: Critical Values- WBC 0.5 and Neutro ABS <0.5

## 2023-04-29 NOTE — Progress Notes (Signed)
PHARMACY NOTE:  ANTIMICROBIAL RENAL DOSAGE ADJUSTMENT  Current antimicrobial regimen includes a mismatch between antimicrobial dosage and estimated renal function.  As per policy approved by the Pharmacy & Therapeutics and Medical Executive Committees, the antimicrobial dosage will be adjusted accordingly.  Current antimicrobial dosage:  cefepime 2gm q24h, vanc 1gm q24h  Indication: FN  Renal Function:  Estimated Creatinine Clearance: 80.3 mL/min (by C-G formula based on SCr of 0.53 mg/dL). []      On intermittent HD, scheduled: []      On CRRT    Antimicrobial dosage has been changed to:  cefepime 2gm IV q8h, vancomycin 750mg  IV q12h (AUC 513.9, Scr 0.8)  Additional comments:   Thank you for allowing pharmacy to be a part of this patient's care. Arley Phenix RPh 04/29/2023, 9:36 PM

## 2023-04-29 NOTE — Telephone Encounter (Signed)
Pt showed up for walk-in to [facility]. Unfortunately we are unable to take walk-ins and pt was directed to ED. See previous note.

## 2023-04-30 ENCOUNTER — Encounter (HOSPITAL_COMMUNITY): Payer: Self-pay | Admitting: Internal Medicine

## 2023-04-30 DIAGNOSIS — R531 Weakness: Secondary | ICD-10-CM | POA: Diagnosis present

## 2023-04-30 DIAGNOSIS — Z1152 Encounter for screening for COVID-19: Secondary | ICD-10-CM | POA: Diagnosis not present

## 2023-04-30 DIAGNOSIS — D709 Neutropenia, unspecified: Principal | ICD-10-CM | POA: Diagnosis present

## 2023-04-30 DIAGNOSIS — Z885 Allergy status to narcotic agent status: Secondary | ICD-10-CM | POA: Diagnosis not present

## 2023-04-30 DIAGNOSIS — Z888 Allergy status to other drugs, medicaments and biological substances status: Secondary | ICD-10-CM | POA: Diagnosis not present

## 2023-04-30 DIAGNOSIS — Z833 Family history of diabetes mellitus: Secondary | ICD-10-CM | POA: Diagnosis not present

## 2023-04-30 DIAGNOSIS — Z90710 Acquired absence of both cervix and uterus: Secondary | ICD-10-CM | POA: Diagnosis not present

## 2023-04-30 DIAGNOSIS — X58XXXA Exposure to other specified factors, initial encounter: Secondary | ICD-10-CM | POA: Diagnosis present

## 2023-04-30 DIAGNOSIS — Z79899 Other long term (current) drug therapy: Secondary | ICD-10-CM

## 2023-04-30 DIAGNOSIS — Z823 Family history of stroke: Secondary | ICD-10-CM | POA: Diagnosis not present

## 2023-04-30 DIAGNOSIS — D701 Agranulocytosis secondary to cancer chemotherapy: Secondary | ICD-10-CM | POA: Diagnosis not present

## 2023-04-30 DIAGNOSIS — T451X5A Adverse effect of antineoplastic and immunosuppressive drugs, initial encounter: Secondary | ICD-10-CM | POA: Diagnosis present

## 2023-04-30 DIAGNOSIS — D84821 Immunodeficiency due to drugs: Secondary | ICD-10-CM | POA: Diagnosis present

## 2023-04-30 DIAGNOSIS — F419 Anxiety disorder, unspecified: Secondary | ICD-10-CM | POA: Diagnosis present

## 2023-04-30 DIAGNOSIS — R6510 Systemic inflammatory response syndrome (SIRS) of non-infectious origin without acute organ dysfunction: Secondary | ICD-10-CM | POA: Diagnosis present

## 2023-04-30 DIAGNOSIS — R5081 Fever presenting with conditions classified elsewhere: Secondary | ICD-10-CM | POA: Diagnosis present

## 2023-04-30 DIAGNOSIS — D61810 Antineoplastic chemotherapy induced pancytopenia: Secondary | ICD-10-CM | POA: Diagnosis present

## 2023-04-30 DIAGNOSIS — Z8249 Family history of ischemic heart disease and other diseases of the circulatory system: Secondary | ICD-10-CM | POA: Diagnosis not present

## 2023-04-30 DIAGNOSIS — Z91040 Latex allergy status: Secondary | ICD-10-CM | POA: Diagnosis not present

## 2023-04-30 DIAGNOSIS — C50911 Malignant neoplasm of unspecified site of right female breast: Secondary | ICD-10-CM | POA: Diagnosis present

## 2023-04-30 DIAGNOSIS — K649 Unspecified hemorrhoids: Secondary | ICD-10-CM | POA: Diagnosis present

## 2023-04-30 DIAGNOSIS — Z171 Estrogen receptor negative status [ER-]: Secondary | ICD-10-CM | POA: Diagnosis not present

## 2023-04-30 DIAGNOSIS — Z820 Family history of epilepsy and other diseases of the nervous system: Secondary | ICD-10-CM | POA: Diagnosis not present

## 2023-04-30 LAB — RESP PANEL BY RT-PCR (RSV, FLU A&B, COVID)  RVPGX2
Influenza A by PCR: NEGATIVE
Influenza B by PCR: NEGATIVE
Resp Syncytial Virus by PCR: NEGATIVE
SARS Coronavirus 2 by RT PCR: NEGATIVE

## 2023-04-30 MED ORDER — TBO-FILGRASTIM 300 MCG/0.5ML ~~LOC~~ SOSY
300.0000 ug | PREFILLED_SYRINGE | Freq: Every day | SUBCUTANEOUS | Status: DC
  Administered 2023-04-30 – 2023-05-01 (×2): 300 ug via SUBCUTANEOUS
  Filled 2023-04-30 (×3): qty 0.5

## 2023-04-30 MED ORDER — MELATONIN 3 MG PO TABS
1.5000 mg | ORAL_TABLET | Freq: Every evening | ORAL | Status: DC | PRN
  Administered 2023-04-30 – 2023-05-05 (×6): 1.5 mg via ORAL
  Filled 2023-04-30 (×7): qty 1

## 2023-04-30 NOTE — Hospital Course (Addendum)
Suzanne Tucker is a 42 y.o. female with a history of right breast cancer on chemotherapy.  Patient presented secondary to fever with associated generalized weakness and fatigue.  On admission, she was found to have evidence of neutropenia consistent with overall presentation consistent with neutropenic fever.  Patient started empirically on vancomycin and cefepime with blood cultures obtained.  Medical oncology consulted as well.  ANC of 0 on admission which has improved prior to discharge.

## 2023-04-30 NOTE — Plan of Care (Signed)
  Problem: Education: Goal: Knowledge of General Education information will improve Description: Including pain rating scale, medication(s)/side effects and non-pharmacologic comfort measures Outcome: Progressing   Problem: Clinical Measurements: Goal: Respiratory complications will improve Outcome: Progressing Goal: Cardiovascular complication will be avoided Outcome: Progressing   Problem: Coping: Goal: Level of anxiety will decrease Outcome: Progressing   Problem: Elimination: Goal: Will not experience complications related to bowel motility Outcome: Progressing Goal: Will not experience complications related to urinary retention Outcome: Progressing   Problem: Pain Managment: Goal: General experience of comfort will improve Outcome: Progressing   Problem: Safety: Goal: Ability to remain free from injury will improve Outcome: Progressing   Problem: Skin Integrity: Goal: Risk for impaired skin integrity will decrease Outcome: Progressing

## 2023-04-30 NOTE — Progress Notes (Signed)
  Progress Note   Patient: Suzanne Tucker ZOX:[id number] DOB: [birth year] DOA: 04/29/2023     0 DOS: the patient was seen and examined on 04/30/2023   Brief hospital course: 42 y.o. female with medical history significant for recent diagnosis of right breast cancer on chemotherapy, she was started on a new chemotherapy on Friday a week ago, endorses a subjective fever today at home with Tmax 100.6.  She took Tylenol.  Associated with generalized weakness fatigue.  She received G-CSF Neulasta on 04/22/2023 for prevention of chemotherapy-induced neutropenia.  The patient is concerned for neutropenic fever.  She initially presented to the cancer center and was sent to [facility]  ED for further evaluation.   In the ED, the patient had a low-grade temperature 99.7.  UA was negative for pyuria.  Chest x-ray was nonacute.  Due to concern for neutropenic fever, cultures were ordered and the patient was started on broad-spectrum IV antibiotics cefepime and IV vancomycin.  TRH, hospitalist service was asked to admit.  Medical oncology consulted.  Assessment and Plan: Severe neutropenia with reported subjective fevers at home with Tmax 100.6. SIRS, no obvious source of infection -UA neg, CXR clear -blood cx pending, thus far neg -Pt reportedly received Neulasta 7/22.  -WBC continues to trend down to 0.3 -Will start daily Granix. Follow CBC trends -continue empiric cefepime   Pancytopenia likely secondary to recent chemotherapy No reported overt bleeding. Transfuse hemoglobin for hgb >7   Generalized weakness Encourage increase in oral protein calorie intake Likely in the setting of malignancy on chemotherapy   Situational anxiety Cont with PRN hydroxyzine      Subjective: Without complaints this AM. Continues to have fevers  Physical Exam: Vitals:   04/30/23 0544 04/30/23 0652 04/30/23 1026 04/30/23 1327  BP: 126/80 117/79 97/61 128/82  Pulse: (!) 111 (!) 101 (!) 108 (!) 109  Resp: 18 16 18 18    Temp: 98.9 F (37.2 C) (!) 101 F (38.3 C) 99.6 F (37.6 C) 100.2 F (37.9 C)  TempSrc: Oral Oral Oral Oral  SpO2: 100% 99% 99% 100%  Weight:      Height:       General exam: Awake, laying in bed, in nad Respiratory system: Normal respiratory effort, no wheezing Cardiovascular system: regular rate, s1, s2 Gastrointestinal system: Soft, nondistended, positive BS Central nervous system: CN2-12 grossly intact, strength intact Extremities: Perfused, no clubbing Skin: Normal skin turgor, no notable skin lesions seen Psychiatry: Mood normal // no visual hallucinations   Data Reviewed:  Labs reviewed: na 136, K 3.6, Cr 0.57, WBC 0.3, Hgb 7.7, plts 52  Family Communication: Pt in room, family not at bedside  Disposition: Status is: Observation The patient will require care spanning > 2 midnights and should be moved to inpatient because: Severity of illness  Planned Discharge Destination: Home     Author: Rickey Barbara, MD 04/30/2023 4:28 PM  For on call review [email or web address].

## 2023-05-01 ENCOUNTER — Encounter (HOSPITAL_COMMUNITY): Payer: Self-pay | Admitting: Internal Medicine

## 2023-05-01 DIAGNOSIS — T451X5A Adverse effect of antineoplastic and immunosuppressive drugs, initial encounter: Secondary | ICD-10-CM | POA: Diagnosis not present

## 2023-05-01 DIAGNOSIS — D84821 Immunodeficiency due to drugs: Secondary | ICD-10-CM | POA: Diagnosis not present

## 2023-05-01 DIAGNOSIS — D709 Neutropenia, unspecified: Secondary | ICD-10-CM | POA: Diagnosis not present

## 2023-05-01 DIAGNOSIS — R5081 Fever presenting with conditions classified elsewhere: Secondary | ICD-10-CM | POA: Diagnosis not present

## 2023-05-01 LAB — CBC
HCT: 23.2 % — ABNORMAL LOW (ref 36.0–46.0)
Hemoglobin: 7.5 g/dL — ABNORMAL LOW (ref 12.0–15.0)
MCH: 31.3 pg (ref 26.0–34.0)
MCHC: 32.3 g/dL (ref 30.0–36.0)
MCV: 96.7 fL (ref 80.0–100.0)
Platelets: 34 10*3/uL — ABNORMAL LOW (ref 150–400)
RBC: 2.4 MIL/uL — ABNORMAL LOW (ref 3.87–5.11)
RDW: 13.2 % (ref 11.5–15.5)
WBC: 0.5 10*3/uL — CL (ref 4.0–10.5)
nRBC: 0 % (ref 0.0–0.2)

## 2023-05-01 LAB — COMPREHENSIVE METABOLIC PANEL WITH GFR
ALT: 11 U/L (ref 0–44)
AST: 13 U/L — ABNORMAL LOW (ref 15–41)
Albumin: 3.7 g/dL (ref 3.5–5.0)
Alkaline Phosphatase: 53 U/L (ref 38–126)
Anion gap: 9 (ref 5–15)
BUN: 9 mg/dL (ref 6–20)
CO2: 23 mmol/L (ref 22–32)
Calcium: 8.9 mg/dL (ref 8.9–10.3)
Chloride: 105 mmol/L (ref 98–111)
Creatinine, Ser: 0.59 mg/dL (ref 0.44–1.00)
GFR, Estimated: >60 mL/min (ref >60)
Glucose, Bld: 98 mg/dL (ref 70–99)
Potassium: 3.7 mmol/L (ref 3.5–5.1)
Sodium: 137 mmol/L (ref 135–145)
Total Bilirubin: 1 mg/dL (ref 0.3–1.2)
Total Protein: 7.2 g/dL (ref 6.5–8.1)

## 2023-05-01 MED ORDER — ALUM & MAG HYDROXIDE-SIMETH 200-200-20 MG/5ML PO SUSP
30.0000 mL | ORAL | Status: DC | PRN
  Administered 2023-05-02: 30 mL via ORAL
  Filled 2023-05-01: qty 30

## 2023-05-01 NOTE — Plan of Care (Signed)
  Problem: Safety: Goal: Ability to remain free from injury will improve Outcome: Progressing   Problem: Coping: Goal: Level of anxiety will decrease Outcome: Progressing   Problem: Clinical Measurements: Goal: Ability to maintain clinical measurements within normal limits will improve Outcome: Progressing

## 2023-05-01 NOTE — Progress Notes (Signed)
  Progress Note   Patient: Suzanne Tucker WUJ:[id number] DOB: 1980-11-17 DOA: 04/29/2023     1 DOS: the patient was seen and examined on 05/01/2023   Brief hospital course: 42 y.o. female with medical history significant for recent diagnosis of right breast cancer on chemotherapy, she was started on a new chemotherapy on Friday a week ago, endorses a subjective fever today at home with Tmax 100.6.  She took Tylenol.  Associated with generalized weakness fatigue.  She received G-CSF Neulasta on 04/22/2023 for prevention of chemotherapy-induced neutropenia.  The patient is concerned for neutropenic fever.  She initially presented to the cancer center and was sent to [facility]  ED for further evaluation.   In the ED, the patient had a low-grade temperature 99.7.  UA was negative for pyuria.  Chest x-ray was nonacute.  Due to concern for neutropenic fever, cultures were ordered and the patient was started on broad-spectrum IV antibiotics cefepime and IV vancomycin.  TRH, hospitalist service was asked to admit.  Medical oncology consulted.  Assessment and Plan: Severe neutropenia with reported subjective fevers at home with Tmax 100.6. SIRS, no obvious source of infection -UA neg, CXR clear -blood cx pending, thus far neg -Pt reportedly received Neulasta 7/22.  -WBC continues to trend down to 0.3 -Will start daily Granix. Follow CBC trends -continue empiric cefepime   Pancytopenia likely secondary to recent chemotherapy No reported overt bleeding. Transfuse hemoglobin for hgb >7   Generalized weakness Encourage increase in oral protein calorie intake Likely in the setting of malignancy on chemotherapy   Situational anxiety Cont with PRN hydroxyzine      Subjective: No complaints this AM  Physical Exam: Vitals:   04/30/23 2003 05/01/23 0224 05/01/23 0521 05/01/23 1328  BP: 112/86 108/69 110/67 113/71  Pulse: (!) 104 (!) 101 98 (!) 107  Resp: 15 18 15 18   Temp: 100 F (37.8 C) 99.7 F (37.6  C) 99.8 F (37.7 C) 99.1 F (37.3 C)  TempSrc: Oral Oral Oral Oral  SpO2: 100% 100% 99% 100%  Weight:      Height:       General exam: Conversant, in no acute distress Respiratory system: normal chest rise, clear, no audible wheezing Cardiovascular system: regular rhythm, s1-s2 Gastrointestinal system: Nondistended, nontender, pos BS Central nervous system: No seizures, no tremors Extremities: No cyanosis, no joint deformities Skin: No rashes, no pallor Psychiatry: Affect normal // no auditory hallucinations   Data Reviewed:  Labs reviewed: Na 137, K 3.7, Cr 0.59, WBC 0.5  Family Communication: Pt in room, family not at bedside  Disposition: Status is: Inpatient Continue inpatient stay because: Severity of illness  Planned Discharge Destination: Home    Author: Rickey Barbara, MD 05/01/2023 5:16 PM  For on call review [email or web address].

## 2023-05-02 ENCOUNTER — Other Ambulatory Visit: Payer: Self-pay | Admitting: Hematology and Oncology

## 2023-05-02 DIAGNOSIS — C50311 Malignant neoplasm of lower-inner quadrant of right female breast: Secondary | ICD-10-CM

## 2023-05-02 DIAGNOSIS — D61810 Antineoplastic chemotherapy induced pancytopenia: Secondary | ICD-10-CM

## 2023-05-02 DIAGNOSIS — D84821 Immunodeficiency due to drugs: Secondary | ICD-10-CM | POA: Diagnosis not present

## 2023-05-02 DIAGNOSIS — T451X5A Adverse effect of antineoplastic and immunosuppressive drugs, initial encounter: Secondary | ICD-10-CM | POA: Diagnosis not present

## 2023-05-02 DIAGNOSIS — R5081 Fever presenting with conditions classified elsewhere: Secondary | ICD-10-CM | POA: Diagnosis not present

## 2023-05-02 DIAGNOSIS — Z171 Estrogen receptor negative status [ER-]: Secondary | ICD-10-CM

## 2023-05-02 DIAGNOSIS — D709 Neutropenia, unspecified: Secondary | ICD-10-CM | POA: Diagnosis not present

## 2023-05-02 LAB — CBC
HCT: 27.6 % — ABNORMAL LOW (ref 36.0–46.0)
Hemoglobin: 9.4 g/dL — ABNORMAL LOW (ref 12.0–15.0)
MCH: 30 pg (ref 26.0–34.0)
MCHC: 34.1 g/dL (ref 30.0–36.0)
MCV: 88.2 fL (ref 80.0–100.0)
Platelets: 22 10*3/uL — CL (ref 150–400)
RBC: 3.13 MIL/uL — ABNORMAL LOW (ref 3.87–5.11)
RDW: 13.8 % (ref 11.5–15.5)
WBC: 0.5 10*3/uL — CL (ref 4.0–10.5)
nRBC: 0 % (ref 0.0–0.2)

## 2023-05-02 LAB — CBC WITH DIFFERENTIAL/PLATELET

## 2023-05-02 LAB — TYPE AND SCREEN
ABO/RH(D): AB NEG
Antibody Screen: NEGATIVE
Unit division: 0
Unit division: 0

## 2023-05-02 LAB — DIFFERENTIAL
Abs Immature Granulocytes: 0.01 10*3/uL (ref 0.00–0.07)
Basophils Absolute: 0 10*3/uL (ref 0.0–0.1)
Basophils Relative: 0 %
Eosinophils Absolute: 0 10*3/uL (ref 0.0–0.5)
Eosinophils Relative: 5 %
Immature Granulocytes: 2 %
Lymphocytes Relative: 83 %
Lymphs Abs: 0.4 10*3/uL — ABNORMAL LOW (ref 0.7–4.0)
Monocytes Absolute: 0 10*3/uL — ABNORMAL LOW (ref 0.1–1.0)
Monocytes Relative: 7 %
Neutro Abs: 0 10*3/uL — CL (ref 1.7–7.7)
Neutrophils Relative %: 2 %

## 2023-05-02 LAB — BPAM RBC
Blood Product Expiration Date: 202409032359
Blood Product Expiration Date: 202409032359
ISSUE DATE / TIME: 202407291458
ISSUE DATE / TIME: 202407291710
Unit Type and Rh: 1700
Unit Type and Rh: 1700

## 2023-05-02 LAB — PREPARE RBC (CROSSMATCH)

## 2023-05-02 MED ORDER — CALCIUM CARBONATE ANTACID 500 MG PO CHEW
1.0000 | CHEWABLE_TABLET | Freq: Three times a day (TID) | ORAL | Status: DC | PRN
  Administered 2023-05-02: 200 mg via ORAL
  Filled 2023-05-02: qty 1

## 2023-05-02 MED ORDER — SODIUM CHLORIDE 0.9% IV SOLUTION: Freq: Once | INTRAVENOUS | Status: AC

## 2023-05-02 MED ORDER — CHLORHEXIDINE GLUCONATE CLOTH 2 % EX PADS
6.0000 | MEDICATED_PAD | Freq: Every day | CUTANEOUS | Status: DC
  Administered 2023-05-02 – 2023-05-06 (×5): 6 via TOPICAL

## 2023-05-02 MED ORDER — PANTOPRAZOLE SODIUM 40 MG PO TBEC
40.0000 mg | DELAYED_RELEASE_TABLET | Freq: Every day | ORAL | Status: DC
  Administered 2023-05-02 – 2023-05-06 (×5): 40 mg via ORAL
  Filled 2023-05-02 (×5): qty 1

## 2023-05-02 NOTE — Consult Note (Addendum)
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  She is cycle 1, day 10 of treatment and received Neulasta (or biosimilar) on day 4 of therapy.  Since admission she has undergone blood cultures which have remained negative.  A viral panel was negative for influenza, RSV, and COVID 19.  She was started on cefepime IV.  Her Tmax overnight was 100.5.  She remains pancytopenic today, her WBC being 0.4, her hemoglobin of 6.8, and platelet count of 25.  Suzanne Tucker is very tearful this morning.  She tells me that she is afraid about how her body has responded to this chemotherapy and whether she is going to make it out of this hospitalization alive.    She notes that she has some hemorrhoids that have been bothering her but no other skin issues.  She has been hesitant to have her port accessed because of the infection risk.  She is very tired and she is concerned about her lab work that continues to be decreased.   Objective:  Vitals:   05/02/23 0412 05/02/23 0547  BP:  107/72  Pulse:    Resp:  14  Temp: (!) 100.5 F (38.1 C) 100.3 F (37.9 C)  SpO2:  98%    Body mass index is 26.83 kg/m.   Sclerae unicteric  Oropharynx shows no thrush or other lesions  No cervical or supraclavicular adenopathy  Lungs-clear to auscultation, no wheezes, rhonchi or rales  Heart regular rate and rhythm  Abdomen soft, +BS  Neuro non-focal, tearful and anxious  Port is non erythematous, non-swollen, non-tender, no sign of infection  Rectal exam: small external hemorrhoids noted, non-erythematous, there are no open lesions in her perianal area, there is no sign of infection noted on exam   Labs:    Basic Metabolic Panel: Recent  Labs  Lab 04/29/23 1930 04/30/23 0828 05/01/23 0747  NA 135 136 137  K 3.6 3.6 3.7  CL 103 104 105  CO2 24 21* 23  GLUCOSE 103* 122* 98  BUN 10 7 9   CREATININE 0.53 0.57 0.59  CALCIUM 9.0 8.7* 8.9  MG  --  1.9  --   PHOS  --  4.2  --    GFR Estimated Creatinine Clearance: 80.3 mL/min (by C-G formula based on SCr of 0.59 mg/dL). Liver Function Tests: Recent Labs  Lab 04/29/23 1930 04/30/23 0828 05/01/23 0747  AST 14* 17 13*  ALT 13 10 11   ALKPHOS 60 53 53  BILITOT 0.7 1.1 1.0  PROT 7.6 6.6 7.2  ALBUMIN 4.1 3.6 3.7   Recent Labs  Lab 04/29/23 1930  LIPASE 45    Coagulation profile Recent Labs  Lab 04/29/23 1930  INR 1.0    CBC: Recent Labs  Lab 04/29/23 1930 04/30/23 0828 05/01/23 0747 05/02/23 0605 05/02/23 0830  WBC 0.5* 0.3* 0.5* 0.4* DUP  NEUTROABS 0.0* 0.0*  --  0.0* PENDING  HGB 8.2* 7.7* 7.5* 6.8* DUP  HCT 24.3* 23.6* 23.2* 20.5* DUP  MCV 93.5 97.5 96.7 92.8 DUP  PLT 61* 52* 34* 25* DUP     Assessment/Plan: 42 y.o. woman with stage III C triple negative breast cancer on cycle 1 day 10 of neoadjuvant chemotherapy admitted with febrile neutropenia and pancytopenia.  Febrile neutropenia: Her cultures have remained negative and her white blood cells have remained low.  Would expect for them to start increasing over the next few days as she received Neulasta.  Daily Granix likely will not yield significant benefit, can d/c.  Continue empiric cefepime.  Consider additional cultures if patient becomes febrile again.  Her skin over her port and her rectum do not appear to be infected. Pancytopenia: Secondary to her chemotherapy.  Anticipate improvement slowly over the next few days.  Transfuse for hemoglobin of less than 7, platelet count less than 20.   Anxiety: Suzanne Tucker is very tearful and I spent a good amount of time giving her reassurance about her blood counts and how sometimes it can take them slightly longer than expected to improve.  We  discussed that we will take things 1 day at a time and moving forward as her counts improve we can consider cycle 2 treatment having a dose reduction to prevent pancytopenia and hospitalization.  Continue Hydroxyzine PRN.    Other medical problems per [facility] hospitalists.  Thank you for the fantastic care you are taking of our mutual patient!  Full code LOS-2 PPX-Protonix  Total encounter time:60 minutes*in face-to-face visit time, chart review, lab review, care coordination, order entry, and documentation of the encounter time.   Lillard Anes, NP 05/02/23 2:35 PM Medical Oncology and Hematology [facility] [address] [place], [place] [address] Tel. [phone]    Fax. [phone]  *Total Encounter Time as defined by the Centers for Medicare and Medicaid Services includes, in addition to the face-to-face time of a patient visit (documented in the note above) non-face-to-face time: obtaining and reviewing outside history, ordering and reviewing medications, tests or procedures, care coordination (communications with other health care professionals or caregivers) and documentation in the medical record.

## 2023-05-02 NOTE — Plan of Care (Signed)

## 2023-05-02 NOTE — Progress Notes (Signed)
  Progress Note   Patient: Suzanne Tucker WUJ:[id number] DOB: [birth year] DOA: 04/29/2023     2 DOS: the patient was seen and examined on 05/02/2023   Brief hospital course: 41 y.o. female with medical history significant for recent diagnosis of right breast cancer on chemotherapy, she was started on a new chemotherapy on Friday a week ago, endorses a subjective fever today at home with Tmax 100.6.  She took Tylenol.  Associated with generalized weakness fatigue.  She received G-CSF Neulasta on 04/22/2023 for prevention of chemotherapy-induced neutropenia.  The patient is concerned for neutropenic fever.  She initially presented to the cancer center and was sent to [facility]  ED for further evaluation.   In the ED, the patient had a low-grade temperature 99.7.  UA was negative for pyuria.  Chest x-ray was nonacute.  Due to concern for neutropenic fever, cultures were ordered and the patient was started on broad-spectrum IV antibiotics cefepime and IV vancomycin.  TRH, hospitalist service was asked to admit.  Medical oncology consulted.  Assessment and Plan: Severe neutropenia with reported subjective fevers at home with Tmax 100.6. SIRS, no obvious source of infection -UA neg, CXR clear -blood cx pending, thus far neg -Pt received Neulasta 7/22.  -remains neutropenic with WBC 0.4 -Had been on daily Granix x past 3 days,now stopped as pt is not expected to have signification benefit over Neulasta. Follow CBC trends -continue empiric cefepime   Pancytopenia likely secondary to recent chemotherapy No reported overt bleeding. Hgb down to 6.8 Discussed with Oncology. Transfused 2 units PRBCs   Generalized weakness Encourage increase in oral protein calorie intake Likely in the setting of malignancy on chemotherapy   Situational anxiety Cont with PRN hydroxyzine      Subjective: Tearful and anxious about her labwork that seemingly is taking longer to improve than pt would like  Physical  Exam: Vitals:   05/02/23 1525 05/02/23 1700 05/02/23 1719 05/02/23 1734  BP: 113/79 113/72  108/86  Pulse: (!) 130 (!) 106  (!) 107  Resp: 19  18 20   Temp: 99.9 F (37.7 C) (!) 100.4 F (38 C)  99.7 F (37.6 C)  TempSrc: Oral Oral  Oral  SpO2: 100% 100%  100%  Weight:      Height:       General exam: Awake, laying in bed, in nad Respiratory system: Normal respiratory effort, no wheezing Cardiovascular system: regular rate, s1, s2 Gastrointestinal system: Soft, nondistended, positive BS Central nervous system: CN2-12 grossly intact, strength intact Extremities: Perfused, no clubbing Skin: Normal skin turgor, no notable skin lesions seen Psychiatry: Mood normal // no visual hallucinations   Data Reviewed:  Labs reviewed: WBC 0.4, Hgb 6.8  Family Communication: Pt in room, family not at bedside  Disposition: Status is: Inpatient Continue inpatient stay because: Severity of illness  Planned Discharge Destination: Home    Author: Rickey Barbara, MD 05/02/2023 5:42 PM  For on call review [email or web address].

## 2023-05-02 NOTE — Progress Notes (Addendum)
ONCOLOGY I saw the patient and examined her. Reassured her that the counts will recover slowly over time     Latest Ref Rng & Units 05/02/2023    8:30 AM 05/02/2023    6:05 AM 05/01/2023    7:47 AM  CBC  WBC K/uL DUP  0.4  0.5   Hemoglobin g/dL DUP  6.8  7.5   Hematocrit % DUP  20.5  23.2   Platelets K/uL DUP  25  34      Agree with 2 units of PRBC today. I discontinued Granix because she had prior Neulasta. Monitoring for neutropenic fever and blood counts. Agree with Lindsey's note.

## 2023-05-03 DIAGNOSIS — R5081 Fever presenting with conditions classified elsewhere: Secondary | ICD-10-CM | POA: Diagnosis not present

## 2023-05-03 DIAGNOSIS — T451X5A Adverse effect of antineoplastic and immunosuppressive drugs, initial encounter: Secondary | ICD-10-CM | POA: Diagnosis not present

## 2023-05-03 DIAGNOSIS — D709 Neutropenia, unspecified: Secondary | ICD-10-CM | POA: Diagnosis not present

## 2023-05-03 DIAGNOSIS — D84821 Immunodeficiency due to drugs: Secondary | ICD-10-CM

## 2023-05-03 NOTE — Consult Note (Signed)
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  She is cycle 1, day 12 of treatment and received Neulasta (or biosimilar) on day 4 of therapy.  Suzanne Tucker's white blood cell count is 0.9 today, increased from 0.4 yesterday.  Her platelets are slightly increased at 29, and her hemoglobin is 9.1.  She is feeling well.  Her blood cultures have remained negative, and her t max was 100.4 on 05/03/2023 at 1pm.  Objective:  Vitals:   05/03/23 2045 05/04/23 0505  BP: 119/81 111/79  Pulse: (!) 109 95  Resp: 20 18  Temp: 99.8 F (37.7 C) 98.9 F (37.2 C)  SpO2: 100% 100%    Body mass index is 26.83 kg/m.   Sclerae unicteric  Oropharynx shows no thrush or other lesions  No cervical or supraclavicular adenopathy  Lungs-clear to auscultation, no wheezes, rhonchi or rales  Heart regular rate and rhythm  Abdomen soft, +BS  Neuro non-focal, tearful and anxious  Port without any signs of infection   Assessment/Plan: 42 y.o. woman with stage III C triple negative breast cancer on cycle 1 day 11 of neoadjuvant chemotherapy admitted with febrile neutropenia and pancytopenia.    Febrile neutropenia: Cultures remain negative, t max 100.4 yesterday at 1pm.  Continue Cefepime.   Pancytopenia: I am hopeful that her WBC jump from 0.4 to 0.9 represents a doubling rate and will continue over the next 1-2 days.  If they continue to improve, she could possibly d/c on Friday.  Anxiety: improved now that she has received a blood transfusion and has better energy.  Cont. Hydroxyzine PRN     Full code LOS-4 PPX-Protonix  Time spent face to face with patient in counseling and discussion: 30 minutes.  Lillard Anes, NP 05/04/23  11:45 AM Medical Oncology and Hematology [facility] [address] [place], [place] [address] Tel. [phone]    Fax. [phone]  *Total Encounter Time as defined by the Centers for Medicare and Medicaid Services includes, in addition to the face-to-face time of a patient visit (documented in the note above) non-face-to-face time: obtaining and reviewing outside history, ordering and reviewing medications, tests or procedures, care coordination (communications with other health care professionals or caregivers) and documentation in the medical record.

## 2023-05-03 NOTE — Progress Notes (Signed)
  Progress Note   Patient: Suzanne Tucker GEX:[id number] DOB: Oct 22, 1980 DOA: 04/29/2023     3 DOS: the patient was seen and examined on 05/03/2023   Brief hospital course: 42 y.o. female with medical history significant for recent diagnosis of right breast cancer on chemotherapy, she was started on a new chemotherapy on Friday a week ago, endorses a subjective fever today at home with Tmax 100.6.  She took Tylenol.  Associated with generalized weakness fatigue.  She received G-CSF Neulasta on 04/22/2023 for prevention of chemotherapy-induced neutropenia.  The patient is concerned for neutropenic fever.  She initially presented to the cancer center and was sent to [facility]  ED for further evaluation.   In the ED, the patient had a low-grade temperature 99.7.  UA was negative for pyuria.  Chest x-ray was nonacute.  Due to concern for neutropenic fever, cultures were ordered and the patient was started on broad-spectrum IV antibiotics cefepime and IV vancomycin.  TRH, hospitalist service was asked to admit.  Medical oncology consulted.  Assessment and Plan: Severe neutropenia with reported subjective fevers at home with Tmax 100.6. SIRS, no obvious source of infection -UA neg, CXR clear -blood cx pending, thus far remains neg -Pt received Neulasta 7/22.  -remains neutropenic with WBC 0.4 -Had been on daily Granix x past 3 days,now stopped as pt is not expected to have signification benefit over Neulasta. Follow CBC trends -continue empiric cefepime   Pancytopenia likely secondary to recent chemotherapy No evidence of obvious bleed Hgb down to 6.8 on 7/29, s/p 2 units PRBC's Hgb stable at this time Plts were down 23k this AM.  Recheck CBC in AM   Generalized weakness Encourage increase in oral protein calorie intake Likely in the setting of malignancy on chemotherapy   Situational anxiety Cont with PRN hydroxyzine      Subjective: In better spirits today. Hopeful for her blood counts to  improve  Physical Exam: Vitals:   05/02/23 2125 05/03/23 0441 05/03/23 0557 05/03/23 1319  BP: 123/79  121/70 114/77  Pulse:   (!) 108 (!) 101  Resp: 16  16 18   Temp: (!) 100.5 F (38.1 C) 99.3 F (37.4 C) 99.3 F (37.4 C) (!) 100.4 F (38 C)  TempSrc: Oral Oral Oral Oral  SpO2: 100%  99% 100%  Weight:      Height:       General exam: Conversant, in no acute distress Respiratory system: normal chest rise, clear, no audible wheezing Cardiovascular system: regular rhythm, s1-s2 Gastrointestinal system: Nondistended, nontender, pos BS Central nervous system: No seizures, no tremors Extremities: No cyanosis, no joint deformities Skin: No rashes, no pallor Psychiatry: Affect normal // no auditory hallucinations   Data Reviewed:  Labs reviewed: WBC 0.5, Hgb 9.4, Plts 23k  Family Communication: Pt in room, family not at bedside  Disposition: Status is: Inpatient Continue inpatient stay because: Severity of illness  Planned Discharge Destination: Home    Author: Rickey Barbara, MD 05/03/2023 3:35 PM  For on call review [email or web address].

## 2023-05-03 NOTE — Plan of Care (Signed)

## 2023-05-04 DIAGNOSIS — R5081 Fever presenting with conditions classified elsewhere: Secondary | ICD-10-CM | POA: Diagnosis not present

## 2023-05-04 DIAGNOSIS — D709 Neutropenia, unspecified: Secondary | ICD-10-CM | POA: Diagnosis not present

## 2023-05-04 DIAGNOSIS — T451X5A Adverse effect of antineoplastic and immunosuppressive drugs, initial encounter: Secondary | ICD-10-CM | POA: Diagnosis not present

## 2023-05-04 DIAGNOSIS — D84821 Immunodeficiency due to drugs: Secondary | ICD-10-CM | POA: Diagnosis not present

## 2023-05-04 NOTE — Plan of Care (Signed)

## 2023-05-04 NOTE — Progress Notes (Signed)
   05/04/23 1619  TOC Brief Assessment  Insurance and Status Reviewed  Patient has primary care physician Yes  Home environment has been reviewed Home  Prior level of function: Independent  Prior/Current Home Services No current home services  Social Determinants of Health Reivew SDOH reviewed no interventions necessary  Readmission risk has been reviewed Yes  Transition of care needs no transition of care needs at this time

## 2023-05-04 NOTE — Progress Notes (Signed)
PROGRESS NOTE    Suzanne Tucker  GNF:[id number] DOB: 1980-12-08 DOA: 04/29/2023 PCP: Serena Croissant, MD   Brief Narrative: 42 y.o. female with medical history significant for recent diagnosis of right breast cancer on chemotherapy, she was started on a new chemotherapy on Friday a week ago, endorses a subjective fever today at home with Tmax 100.6.  She took Tylenol.  Associated with generalized weakness fatigue.  She received G-CSF Neulasta on 04/22/2023 for prevention of chemotherapy-induced neutropenia.  The patient is concerned for neutropenic fever.  She initially presented to the cancer center and was sent to [facility]  ED for further evaluation.   In the ED, the patient had a low-grade temperature 99.7.  UA was negative for pyuria.  Chest x-ray was nonacute.  Due to concern for neutropenic fever, cultures were ordered and the patient was started on broad-spectrum IV antibiotics cefepime and IV vancomycin.  TRH, hospitalist service was asked to admit.  Medical oncology consulted.   Assessment and Plan:  Febrile neutropenia Documented fever of 100.6 F at home.  Blood cultures obtained on admission.  Patient started empirically on cefepime on admission.  Blood cultures with no growth to date.  Patient was started on Granix on admission for neutropenia which was discontinued secondary to receiving Neulasta prior to admission.  Patient with Tmax of 100.4 F over the last 24 hours. -Continue cefepime -Daily CBC with differential  SIRS Present on admission.  No source of infection.  Pancytopenia Secondary to chemotherapy.  Oncology consulted.  Patient with a hemoglobin down to 6.8 this admission requiring 2 units of PRBC via transfusion.  Platelets also down to 22,000. -Daily CBC -Transfuse PRBC/platelets as needed to maintain hemoglobin greater than 7 and platelets greater than 20,000 with bleeding  Generalized weakness Likely related to chemotherapy.  Encourage  mobilization.  Anxiety -Continue hydroxyzine  Right breast cancer Patient follows with Dr. [name] as an outpatient.  Patient is currently receiving neoadjuvant chemotherapy   DVT prophylaxis: SCDs Code Status:   Code Status: Full Code Family Communication: None at bedside Disposition Plan: Discharge home pending improvement of fevers, improvement of neutrophil count   Consultants:  Medical oncology  Procedures:  None  Antimicrobials: Cefepime   Subjective: Patient reports feeling pretty good today.  She still does not feel at 100% baseline.  Objective: BP 124/80 (BP Location: Left Arm)   Pulse (!) 108   Temp 98.4 F (36.9 C) (Oral)   Resp 18   Ht 5\' 1"  (1.549 m)   Wt 64.4 kg   SpO2 100%   BMI 26.83 kg/m   Examination:  General exam: Appears calm and comfortable Respiratory system: Clear to auscultation. Respiratory effort normal. Cardiovascular system: S1 & S2 heard, RRR. No murmurs Gastrointestinal system: Abdomen is nondistended, soft and nontender. Normal bowel sounds heard. Central nervous system: Alert and oriented. No focal neurological deficits. Musculoskeletal: No edema. No calf tenderness Skin: No cyanosis. No rashes Psychiatry: Judgement and insight appear normal. Mood & affect appropriate.    Data Reviewed: I have personally reviewed following labs and imaging studies  CBC Lab Results  Component Value Date   WBC 0.9 (LL) 05/04/2023   RBC 2.97 (L) 05/04/2023   HGB 9.1 (L) 05/04/2023   HCT 26.5 (L) 05/04/2023   MCV 89.2 05/04/2023   MCH 30.6 05/04/2023   PLT 29 (LL) 05/04/2023   MCHC 34.3 05/04/2023   RDW 13.2 05/04/2023   LYMPHSABS 0.4 (L) 05/04/2023   MONOABS 0.2 05/04/2023   EOSABS 0.0 05/04/2023  BASOSABS 0.0 05/04/2023     Last metabolic panel Lab Results  Component Value Date   NA 137 05/04/2023   K 3.6 05/04/2023   CL 102 05/04/2023   CO2 26 05/04/2023   BUN 9 05/04/2023   CREATININE 0.58 05/04/2023   GLUCOSE 96  05/04/2023   GFRNONAA >60 05/04/2023   GFRAA 132 11/04/2020   CALCIUM 9.1 05/04/2023   PHOS 4.2 04/30/2023   PROT 6.8 05/04/2023   ALBUMIN 3.3 (L) 05/04/2023   LABGLOB 2.2 04/08/2022   AGRATIO 2.0 04/08/2022   BILITOT 0.8 05/04/2023   ALKPHOS 54 05/04/2023   AST 13 (L) 05/04/2023   ALT 12 05/04/2023   ANIONGAP 9 05/04/2023    GFR: Estimated Creatinine Clearance: 80.3 mL/min (by C-G formula based on SCr of 0.58 mg/dL).  Recent Results (from the past 240 hour(s))  Blood culture (routine x 2)     Status: None (Preliminary result)   Collection Time: 04/29/23  7:39 PM   Specimen: Left Antecubital; Blood  Result Value Ref Range Status   Specimen Description   Final    LEFT ANTECUBITAL BOTTLES DRAWN AEROBIC AND ANAEROBIC Performed at [facility], [address] [place], [place] [address]    Special Requests   Final    Blood Culture results may not be optimal due to an excessive volume of blood received in culture bottles Performed at [facility], [address], [place], [place] [address]    Culture   Final    NO GROWTH 4 DAYS Performed at [facility], [address], [place], [place] [address]    Report Status PENDING  Incomplete  Blood culture (routine x 2)     Status: None (Preliminary result)   Collection Time: 04/29/23  7:40 PM   Specimen: BLOOD LEFT ARM  Result Value Ref Range Status   Specimen Description   Final    BLOOD LEFT ARM BOTTLES DRAWN AEROBIC AND ANAEROBIC Performed at [facility], [address] [place], [place] [address]    Special Requests   Final    Blood Culture results may not be optimal due to an inadequate volume of blood received in culture bottles Performed at [facility], [address], [place], [place] [address]    Culture   Final    NO GROWTH 4 DAYS Performed at [facility], [address], [place], [place] [address]    Report Status PENDING   Incomplete  Resp panel by RT-PCR (RSV, Flu A&B, Covid)     Status: None   Collection Time: 04/30/23  6:45 AM  Result Value Ref Range Status   SARS Coronavirus 2 by RT PCR NEGATIVE NEGATIVE Final    Comment: (NOTE) SARS-CoV-2 target nucleic acids are NOT DETECTED.  The SARS-CoV-2 RNA is generally detectable in upper respiratory specimens during the acute phase of infection. The lowest concentration of SARS-CoV-2 viral copies this assay can detect is 138 copies/mL. A negative result does not preclude SARS-Cov-2 infection and should not be used as the sole basis for treatment or other patient management decisions. A negative result may occur with  improper specimen collection/handling, submission of specimen other than nasopharyngeal swab, presence of viral mutation(s) within the areas targeted by this assay, and inadequate number of viral copies(<138 copies/mL). A negative result must be combined with clinical observations, patient history, and epidemiological information. The expected result is Negative.  Fact Sheet for Patients:  BloggerCourse.com  Fact Sheet  for Healthcare Providers:  SeriousBroker.it  This test is no t yet approved or cleared by the [place] and  has been authorized for detection and/or diagnosis of SARS-CoV-2 by FDA under an Emergency Use Authorization (EUA). This EUA will remain  in effect (meaning this test can be used) for the duration of the COVID-19 declaration under Section 564(b)(1) of the Act, 21 U.S.C.section 360bbb-3(b)(1), unless the authorization is terminated  or revoked sooner.       Influenza A by PCR NEGATIVE NEGATIVE Final   Influenza B by PCR NEGATIVE NEGATIVE Final    Comment: (NOTE) The Xpert Xpress SARS-CoV-2/FLU/RSV plus assay is intended as an aid in the diagnosis of influenza from Nasopharyngeal swab specimens and should not be used as a sole basis for treatment. Nasal  washings and aspirates are unacceptable for Xpert Xpress SARS-CoV-2/FLU/RSV testing.  Fact Sheet for Patients: BloggerCourse.com  Fact Sheet for Healthcare Providers: SeriousBroker.it  This test is not yet approved or cleared by the [place] FDA and has been authorized for detection and/or diagnosis of SARS-CoV-2 by FDA under an Emergency Use Authorization (EUA). This EUA will remain in effect (meaning this test can be used) for the duration of the COVID-19 declaration under Section 564(b)(1) of the Act, 21 U.S.C. section 360bbb-3(b)(1), unless the authorization is terminated or revoked.     Resp Syncytial Virus by PCR NEGATIVE NEGATIVE Final    Comment: (NOTE) Fact Sheet for Patients: BloggerCourse.com  Fact Sheet for Healthcare Providers: SeriousBroker.it  This test is not yet approved or cleared by the [place] FDA and has been authorized for detection and/or diagnosis of SARS-CoV-2 by FDA under an Emergency Use Authorization (EUA). This EUA will remain in effect (meaning this test can be used) for the duration of the COVID-19 declaration under Section 564(b)(1) of the Act, 21 U.S.C. section 360bbb-3(b)(1), unless the authorization is terminated or revoked.  Performed at [facility], [address] [place], [place] [address]       Radiology Studies: No results found.    LOS: 4 days    Jacquelin Hawking, MD [facility] 05/04/2023, 3:48 PM   If 7PM-7AM, please contact night-coverage [email or web address]

## 2023-05-05 ENCOUNTER — Encounter: Payer: Self-pay | Admitting: Hematology and Oncology

## 2023-05-05 DIAGNOSIS — D84821 Immunodeficiency due to drugs: Secondary | ICD-10-CM | POA: Diagnosis not present

## 2023-05-05 DIAGNOSIS — R5081 Fever presenting with conditions classified elsewhere: Secondary | ICD-10-CM | POA: Diagnosis not present

## 2023-05-05 DIAGNOSIS — T451X5A Adverse effect of antineoplastic and immunosuppressive drugs, initial encounter: Secondary | ICD-10-CM | POA: Diagnosis not present

## 2023-05-05 DIAGNOSIS — D709 Neutropenia, unspecified: Secondary | ICD-10-CM | POA: Diagnosis not present

## 2023-05-05 LAB — CULTURE, BLOOD (ROUTINE X 2): Culture: NO GROWTH

## 2023-05-05 NOTE — Plan of Care (Signed)

## 2023-05-05 NOTE — Plan of Care (Signed)

## 2023-05-05 NOTE — Consult Note (Signed)
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  She is cycle 1, day 13 of treatment and received Neulasta (or biosimilar) on day 4 of therapy.  Her WBC has improved and her cultures have remained negative.  Her last fever occurred yesterday and she tells me that she continues to feel better every day.  She is bored, but denies any new issues or concerns.   Objective:  Vitals:   05/05/23 0435 05/05/23 1314  BP: 98/63 119/77  Pulse: 95 94  Resp: 18 18  Temp: 98.6 F (37 C) 98.5 F (36.9 C)  SpO2: 100% 100%    Body mass index is 26.83 kg/m.   Sclerae unicteric  Oropharynx clear  Lungs-clear to auscultation  Heart regular rate and rhythm  Abdomen soft, +BS  Neuro non-focal  Port accessed without any signs of infection   Assessment/Plan: 42 y.o. woman with stage III C triple negative breast cancer on cycle 1 day 11 of neoadjuvant chemotherapy admitted with febrile neutropenia and pancytopenia.    Febrile neutropenia: Cultures remain negative, WBC are improving, last fever yesterday.  Will d/c cefepime.   Pancytopenia: Her WBC appears to be doubling.  Will d/c cefepime.  Hopeful discharge tomorrow, pending lab results.  Would transfuse if hemoglobin less than 7, we are arranging f/u with [place] next week to check labs and discuss future treatment and dose reductions.  We can transfuse her as an outpatient next week if needed.  Anxiety: Continues to improve, hydroxyzine PRN   Full code LOS-5 PPX-Protonix  Dr. [name] and I discussed this case in detail and he is aware and in agreement with the above assessment and plan.   Time spent face to face with patient in counseling and discussion: 30  minutes.  Lillard Anes, NP 05/05/23 2:05 PM Medical Oncology and Hematology [facility] [address] [place], [place] [address] Tel. [phone]    Fax. [phone]  *Total Encounter Time as defined by the Centers for Medicare and Medicaid Services includes, in addition to the face-to-face time of a patient visit (documented in the note above) non-face-to-face time: obtaining and reviewing outside history, ordering and reviewing medications, tests or procedures, care coordination (communications with other health care professionals or caregivers) and documentation in the medical record.

## 2023-05-05 NOTE — Progress Notes (Addendum)
PROGRESS NOTE    Suzanne Tucker  ZOX:[id number] DOB: 1981/05/17 DOA: 04/29/2023 PCP: Serena Croissant, MD   Brief Narrative: 42 y.o. female with medical history significant for recent diagnosis of right breast cancer on chemotherapy, she was started on a new chemotherapy on Friday a week ago, endorses a subjective fever today at home with Tmax 100.6.  She took Tylenol.  Associated with generalized weakness fatigue.  She received G-CSF Neulasta on 04/22/2023 for prevention of chemotherapy-induced neutropenia.  The patient is concerned for neutropenic fever.  She initially presented to the cancer center and was sent to [facility]  ED for further evaluation.   In the ED, the patient had a low-grade temperature 99.7.  UA was negative for pyuria.  Chest x-ray was nonacute.  Due to concern for neutropenic fever, cultures were ordered and the patient was started on broad-spectrum IV antibiotics cefepime and IV vancomycin.  TRH, hospitalist service was asked to admit.  Medical oncology consulted.   Assessment and Plan:  Febrile neutropenia Documented fever of 100.6 F at home.  Blood cultures obtained on admission.  Patient started empirically on cefepime on admission in addition to receiving 1 dose of vancomycin.  Blood cultures with no growth to date.  Patient was started on Granix on admission for neutropenia which was discontinued secondary to receiving Neulasta prior to admission.  Patient with Tmax of 99.2 F over the last 24 hours.  ANC improving. -Discontinue cefepime for medical oncology recommendations -Daily CBC with differential  SIRS Present on admission.  No source of infection.  Pancytopenia Secondary to chemotherapy.  Oncology consulted.  Patient with a hemoglobin down to 6.8 this admission requiring 2 units of PRBC via transfusion.  Hemoglobin starting to trend back down.  Platelets also down to 22,000 but are now improving -Daily CBC -Transfuse PRBC/platelets as needed to maintain  hemoglobin greater than 7 and platelets greater than 20,000 with bleeding  Generalized weakness Likely related to chemotherapy.  Encourage mobilization.  Anxiety -Continue hydroxyzine  Right breast cancer Patient follows with Dr. [name] as an outpatient.  Patient is currently receiving neoadjuvant chemotherapy   DVT prophylaxis: SCDs Code Status:   Code Status: Full Code Family Communication: None at bedside Disposition Plan: Discharge home likely in 24 hours if blood counts continue to improve   Consultants:  Medical oncology  Procedures:  None  Antimicrobials: Cefepime   Subjective: No concerns this morning.  Happy see her labs trending in the way they are.  Objective: BP 119/77 (BP Location: Left Arm)   Pulse 94   Temp 98.5 F (36.9 C) (Oral)   Resp 18   Ht 5\' 1"  (1.549 m)   Wt 64.4 kg   SpO2 100%   BMI 26.83 kg/m   Examination:  General exam: Appears calm and comfortable Respiratory system: Clear to auscultation. Respiratory effort normal. Cardiovascular system: S1 & S2 heard, RRR. Gastrointestinal system: Abdomen is nondistended, soft and nontender. Normal bowel sounds heard. Central nervous system: Alert and oriented. No focal neurological deficits. Musculoskeletal: No edema. No calf tenderness Skin: No cyanosis. No rashes Psychiatry: Judgement and insight appear normal. Mood & affect appropriate.    Data Reviewed: I have personally reviewed following labs and imaging studies  CBC Lab Results  Component Value Date   WBC 2.0 (L) 05/05/2023   RBC 2.76 (L) 05/05/2023   HGB 8.3 (L) 05/05/2023   HCT 24.7 (L) 05/05/2023   MCV 89.5 05/05/2023   MCH 30.1 05/05/2023   PLT 50 (L) 05/05/2023  MCHC 33.6 05/05/2023   RDW 13.2 05/05/2023   LYMPHSABS 0.5 (L) 05/05/2023   MONOABS 0.5 05/05/2023   EOSABS 0.0 05/05/2023   BASOSABS 0.0 05/05/2023     Last metabolic panel Lab Results  Component Value Date   NA 137 05/04/2023   K 3.6 05/04/2023   CL  102 05/04/2023   CO2 26 05/04/2023   BUN 9 05/04/2023   CREATININE 0.58 05/04/2023   GLUCOSE 96 05/04/2023   GFRNONAA >60 05/04/2023   GFRAA 132 11/04/2020   CALCIUM 9.1 05/04/2023   PHOS 4.2 04/30/2023   PROT 6.8 05/04/2023   ALBUMIN 3.3 (L) 05/04/2023   LABGLOB 2.2 04/08/2022   AGRATIO 2.0 04/08/2022   BILITOT 0.8 05/04/2023   ALKPHOS 54 05/04/2023   AST 13 (L) 05/04/2023   ALT 12 05/04/2023   ANIONGAP 9 05/04/2023    GFR: Estimated Creatinine Clearance: 80.3 mL/min (by C-G formula based on SCr of 0.58 mg/dL).  Recent Results (from the past 240 hour(s))  Blood culture (routine x 2)     Status: None   Collection Time: 04/29/23  7:39 PM   Specimen: Left Antecubital; Blood  Result Value Ref Range Status   Specimen Description   Final    LEFT ANTECUBITAL BOTTLES DRAWN AEROBIC AND ANAEROBIC Performed at [facility] [address] [place], [place] [address]    Special Requests   Final    Blood Culture results may not be optimal due to an excessive volume of blood received in culture bottles Performed at [facility], [address], [place], [place] [address]    Culture   Final    NO GROWTH 5 DAYS Performed at [facility], [address], [place], [place] [address]    Report Status 05/05/2023 FINAL  Final  Blood culture (routine x 2)     Status: None   Collection Time: 04/29/23  7:40 PM   Specimen: BLOOD LEFT ARM  Result Value Ref Range Status   Specimen Description   Final    BLOOD LEFT ARM BOTTLES DRAWN AEROBIC AND ANAEROBIC Performed at [facility], [address], [place], [place] [address]    Special Requests   Final    Blood Culture results may not be optimal due to an inadequate volume of blood received in culture bottles Performed at [facility], [address] [place], [place] [address]    Culture   Final    NO GROWTH 5 DAYS Performed at [facility], [address], [place], [place] [address]    Report Status 05/05/2023 FINAL  Final  Resp panel by RT-PCR (RSV, Flu A&B, Covid)     Status: None   Collection Time: 04/30/23  6:45 AM  Result Value Ref Range Status   SARS Coronavirus 2 by RT PCR NEGATIVE NEGATIVE Final    Comment: (NOTE) SARS-CoV-2 target nucleic acids are NOT DETECTED.  The SARS-CoV-2 RNA is generally detectable in upper respiratory specimens during the acute phase of infection. The lowest concentration of SARS-CoV-2 viral copies this assay can detect is 138 copies/mL. A negative result does not preclude SARS-Cov-2 infection and should not be used as the sole basis for treatment or other patient management decisions. A negative result may occur with  improper specimen collection/handling, submission of specimen other than nasopharyngeal swab, presence of viral mutation(s) within the areas targeted by this assay, and inadequate number of viral copies(<138 copies/mL). A negative result must be  combined with clinical observations, patient history, and epidemiological information. The expected result is Negative.  Fact Sheet for Patients:  BloggerCourse.com  Fact Sheet for Healthcare Providers:  SeriousBroker.it  This test is no t yet approved or cleared by the [place] FDA and  has been authorized for detection and/or diagnosis of SARS-CoV-2 by FDA under an Emergency Use Authorization (EUA). This EUA will remain  in effect (meaning this test can be used) for the duration of the COVID-19 declaration under Section 564(b)(1) of the Act, 21 U.S.C.section 360bbb-3(b)(1), unless the authorization is terminated  or revoked sooner.       Influenza A by PCR NEGATIVE NEGATIVE Final   Influenza B by PCR NEGATIVE NEGATIVE Final    Comment: (NOTE) The Xpert Xpress SARS-CoV-2/FLU/RSV plus assay is intended as an aid in the diagnosis of influenza from Nasopharyngeal swab specimens  and should not be used as a sole basis for treatment. Nasal washings and aspirates are unacceptable for Xpert Xpress SARS-CoV-2/FLU/RSV testing.  Fact Sheet for Patients: BloggerCourse.com  Fact Sheet for Healthcare Providers: SeriousBroker.it  This test is not yet approved or cleared by the [place] FDA and has been authorized for detection and/or diagnosis of SARS-CoV-2 by FDA under an Emergency Use Authorization (EUA). This EUA will remain in effect (meaning this test can be used) for the duration of the COVID-19 declaration under Section 564(b)(1) of the Act, 21 U.S.C. section 360bbb-3(b)(1), unless the authorization is terminated or revoked.     Resp Syncytial Virus by PCR NEGATIVE NEGATIVE Final    Comment: (NOTE) Fact Sheet for Patients: BloggerCourse.com  Fact Sheet for Healthcare Providers: SeriousBroker.it  This test is not yet approved or cleared by the [place] FDA and has been authorized for detection and/or diagnosis of SARS-CoV-2 by FDA under an Emergency Use Authorization (EUA). This EUA will remain in effect (meaning this test can be used) for the duration of the COVID-19 declaration under Section 564(b)(1) of the Act, 21 U.S.C. section 360bbb-3(b)(1), unless the authorization is terminated or revoked.  Performed at [facility], [address] [place], [place] [address]       Radiology Studies: No results found.    LOS: 5 days    Jacquelin Hawking, MD [facility] 05/05/2023, 3:53 PM   If 7PM-7AM, please contact night-coverage [email or web address]

## 2023-05-06 ENCOUNTER — Encounter: Payer: Self-pay | Admitting: Hematology and Oncology

## 2023-05-06 ENCOUNTER — Other Ambulatory Visit: Payer: Self-pay

## 2023-05-06 ENCOUNTER — Telehealth: Payer: Self-pay | Admitting: Adult Health

## 2023-05-06 DIAGNOSIS — T451X5A Adverse effect of antineoplastic and immunosuppressive drugs, initial encounter: Secondary | ICD-10-CM | POA: Diagnosis not present

## 2023-05-06 DIAGNOSIS — D701 Agranulocytosis secondary to cancer chemotherapy: Secondary | ICD-10-CM | POA: Diagnosis not present

## 2023-05-06 DIAGNOSIS — Z171 Estrogen receptor negative status [ER-]: Secondary | ICD-10-CM

## 2023-05-06 MED ORDER — HEPARIN SOD (PORK) LOCK FLUSH 100 UNIT/ML IV SOLN
500.0000 [IU] | INTRAVENOUS | Status: DC | PRN
  Filled 2023-05-06: qty 5

## 2023-05-06 NOTE — Discharge Instructions (Addendum)
Suzanne Tucker,  You were in the hospital because of low blood counts and fevers concerning for possible infection. You were managed with antibiotics. Thankfully your blood counts have improved and you are safe to discharge home off of antibiotics. Please follow-up with your oncologist.

## 2023-05-06 NOTE — Discharge Summary (Addendum)
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 *  Hospital Course: Suzanne Tucker is a 42 y.o. female with a history of right breast cancer on chemotherapy.  Patient presented secondary to fever with associated generalized weakness and fatigue.  On admission, she was found to have evidence of neutropenia consistent with overall presentation consistent with neutropenic fever.  Patient started empirically on vancomycin and cefepime with blood cultures obtained.  Medical oncology consulted as well.  ANC of 0 on admission which has improved prior to discharge.  Assessment and Plan:  Febrile neutropenia Documented fever of 100.6 F at home.  Blood cultures obtained on admission.  Patient started empirically on cefepime on admission in addition to receiving 1 dose of vancomycin.  Blood cultures with no growth to date.  Patient was started on Granix on admission for neutropenia which was discontinued secondary to receiving Neulasta prior to admission.  Empiric cefepime was discontinued after 6 days of treatment with no further development of fevers.  ANC improved to 4100 on day of discharge.   SIRS Present on admission.  No source of infection.  Resolved.   Pancytopenia Secondary to chemotherapy.  Oncology consulted.  Patient with a hemoglobin down to 6.8 this admission requiring 2 units of PRBC via transfusion.  Platelets also down to 22,000.  Hemoglobin of 8.7 with platelets of 91,000 on day of discharge.   Patient to follow-up with medical oncology as an outpatient.   Generalized weakness Likely related to chemotherapy.  Encourage mobilization. Improved.   Anxiety Managed with hydroxyzine while inpatient.   Right breast cancer Patient follows with Dr. [name] as an outpatient.  Patient is currently receiving neoadjuvant chemotherapy  Consultants: Medical oncology Procedures performed: None Disposition: Home Diet recommendation: Regular diet   DISCHARGE MEDICATION: Allergies as of 05/06/2023       Reactions   Morphine Anaphylaxis, Shortness Of Breath   Tessalon [benzonatate] Hives   Hives, itching, burning all over.    Hydrocodone Hives   Latex Rash   Antihistamines, Loratadine-type Other (See Comments)   Patient reports numbness in tongue   Codeine Nausea And Vomiting   Tramadol Other (See Comments)   Tingling in the mouth with swelling        Medication List     TAKE these medications    acetaminophen 500 MG tablet Commonly known as: TYLENOL Take 1,000 mg by mouth every 6 (six) hours as needed for mild pain or moderate pain.   calcium carbonate 500 MG chewable tablet Commonly known as: TUMS - dosed in mg elemental calcium Chew 1,000 mg by mouth as needed for indigestion or heartburn.   fexofenadine 180 MG tablet Commonly known as: ALLEGRA Take 180 mg by mouth as needed for allergies or rhinitis.   fluticasone 50 MCG/ACT nasal spray Commonly known as: FLONASE Place 2 sprays into both nostrils daily. What changed:  when to take this reasons to take this   ondansetron 8 MG tablet Commonly known as: Zofran Take 1 tablet (8 mg total) by mouth every 8 (eight) hours as needed  for nausea or vomiting. Start on the third day after chemotherapy.   prochlorperazine 10 MG tablet Commonly known as: COMPAZINE Take 1 tablet (10 mg total) by mouth every 6 (six) hours as needed for nausea or vomiting.        Follow-up Information     Serena Croissant, MD. Schedule an  appointment as soon as possible for a visit.   Specialty: Hematology and Oncology Contact information: [address] [place] [address] [phone]                Discharge Exam: BP (!) 102/55 (BP Location: Left Arm)   Pulse (!) 101   Temp 98.7 F (37.1 C) (Oral)   Resp 18   Ht 5\' 1"  (1.549 m)   Wt 64.4 kg   SpO2 98%   BMI 26.83 kg/m   General exam: Appears calm and comfortable Respiratory system: Respiratory effort normal. Central nervous system: Alert and oriented. No focal neurological deficits. Psychiatry: Judgement and insight appear normal. Mood & affect appropriate.   Condition at discharge: stable  The results of significant diagnostics from this hospitalization (including imaging, microbiology, ancillary and laboratory) are listed below for reference.   Imaging Studies: DG Chest Portable 1 View  Result Date: 04/29/2023 CLINICAL DATA:  Fever. EXAM: PORTABLE CHEST 1 VIEW COMPARISON:  January 30, 2023. FINDINGS: The heart size and mediastinal contours are within normal limits. Left subclavian Port-A-Cath is unchanged in position. Both lungs are clear. The visualized skeletal structures are unremarkable. IMPRESSION: No active disease. Electronically Signed   By: Lupita Raider M.D.   On: 04/29/2023 17:47   ECHOCARDIOGRAM COMPLETE  Result Date: 04/19/2023    ECHOCARDIOGRAM REPORT   Patient Name:   [facility] Date of Exam: 04/19/2023 Medical Rec #:  [id number]       Height:       61.0 in Accession #:    [id number]      Weight:       147.1 lb Date of Birth:  May 16, 1981       BSA:          1.657 m Patient Age:    42 years        BP:           127/70 mmHg Patient Gender: F               HR:           83 bpm. Exam Location:  [address] Procedure: 2D Echo, Cardiac Doppler, Color Doppler, 3D Echo and Strain Analysis Indications:    Chemo Z09  History:        Patient has prior history of Echocardiogram examinations, most                 recent  10/22/2020. Breast Cancer; Mitral Valve Prolapse.  Sonographer:    Thurman Coyer RDCS Referring Phys: [id number] Serena Croissant IMPRESSIONS  1. Left ventricular ejection fraction, by estimation, is 60 to 65%. Left ventricular ejection fraction by 3D volume is 61 %. The left ventricle has normal function. The left ventricle has no regional wall motion abnormalities. Left ventricular diastolic  parameters were normal. The average left ventricular global longitudinal strain is -18.9 %. The global longitudinal strain is normal.  2. Right ventricular systolic function is normal. The right ventricular size is normal. Tricuspid regurgitation signal is inadequate for assessing PA pressure.  3. The mitral valve is normal in structure. Trivial mitral valve regurgitation. No evidence of mitral  stenosis.  4. The aortic valve is normal in structure. Aortic valve regurgitation is not visualized. No aortic stenosis is present.  5. The inferior vena cava is normal in size with greater than 50% respiratory variability, suggesting right atrial pressure of 3 mmHg. FINDINGS  Left Ventricle: Left ventricular ejection fraction, by estimation, is 60 to 65%. Left ventricular ejection fraction by 3D volume is 61 %. The left ventricle has normal function. The left ventricle has no regional wall motion abnormalities. The average left ventricular global longitudinal strain is -18.9 %. The global longitudinal strain is normal. The left ventricular internal cavity size was normal in size. There is no left ventricular hypertrophy. Left ventricular diastolic parameters were normal. Normal left ventricular filling pressure. Right Ventricle: The right ventricular size is normal. No increase in right ventricular wall thickness. Right ventricular systolic function is normal. Tricuspid regurgitation signal is inadequate for assessing PA pressure. Left Atrium: Left atrial size was normal in size. Right Atrium: Right atrial size was normal in size.  Pericardium: There is no evidence of pericardial effusion. Mitral Valve: The mitral valve is normal in structure. Trivial mitral valve regurgitation. No evidence of mitral valve stenosis. Tricuspid Valve: The tricuspid valve is normal in structure. Tricuspid valve regurgitation is trivial. No evidence of tricuspid stenosis. Aortic Valve: The aortic valve is normal in structure. Aortic valve regurgitation is not visualized. No aortic stenosis is present. Pulmonic Valve: The pulmonic valve was normal in structure. Pulmonic valve regurgitation is trivial. No evidence of pulmonic stenosis. Aorta: The aortic root is normal in size and structure. Venous: The inferior vena cava is normal in size with greater than 50% respiratory variability, suggesting right atrial pressure of 3 mmHg. IAS/Shunts: No atrial level shunt detected by color flow Doppler.  LEFT VENTRICLE PLAX 2D LVIDd:         4.40 cm         Diastology LVIDs:         3.10 cm         LV e' medial:    9.25 cm/s LV PW:         0.90 cm         LV E/e' medial:  4.7 LV IVS:        0.70 cm         LV e' lateral:   9.68 cm/s LVOT diam:     2.10 cm         LV E/e' lateral: 4.5 LV SV:         51 LV SV Index:   31              2D LVOT Area:     3.46 cm        Longitudinal                                Strain                                2D Strain GLS  -19.6 %                                (A2C):  2D Strain GLS  -19.3 %                                (A3C):                                2D Strain GLS  -17.8 %                                (A4C):                                2D Strain GLS  -18.9 %                                Avg:                                 3D Volume EF                                LV 3D EF:    Left                                             ventricul                                             ar                                             ejection                                             fraction                                              by 3D                                             volume is                                             61 %.  3D Volume EF:                                3D EF:        61 %                                LV EDV:       90 ml                                LV ESV:       35 ml                                LV SV:        54 ml RIGHT VENTRICLE RV Basal diam:  2.50 cm RV Mid diam:    2.10 cm RV S prime:     12.70 cm/s TAPSE (M-mode): 2.5 cm LEFT ATRIUM             Index        RIGHT ATRIUM          Index LA diam:        2.80 cm 1.69 cm/m   RA Area:     9.98 cm LA Vol (A2C):   24.6 ml 14.84 ml/m  RA Volume:   18.70 ml 11.28 ml/m LA Vol (A4C):   13.1 ml 7.90 ml/m LA Biplane Vol: 19.1 ml 11.52 ml/m  AORTIC VALVE LVOT Vmax:   89.20 cm/s LVOT Vmean:  56.200 cm/s LVOT VTI:    0.147 m  AORTA Ao Root diam: 2.70 cm Ao Asc diam:  2.80 cm MITRAL VALVE MV Area (PHT): 4.71 cm    SHUNTS MV Decel Time: 161 msec    Systemic VTI:  0.15 m MV E velocity: 43.30 cm/s  Systemic Diam: 2.10 cm MV A velocity: 57.50 cm/s MV E/A ratio:  0.75 Armanda Magic MD Electronically signed by Armanda Magic MD Signature Date/Time: 04/19/2023/11:22:07 AM    Final     Microbiology: Results for orders placed or performed during the hospital encounter of 04/29/23  Blood culture (routine x 2)     Status: None   Collection Time: 04/29/23  7:39 PM   Specimen: Left Antecubital; Blood  Result Value Ref Range Status   Specimen Description   Final    LEFT ANTECUBITAL BOTTLES DRAWN AEROBIC AND ANAEROBIC Performed at [facility], [address], [place], [place] [address]    Special Requests   Final    Blood Culture results may not be optimal due to an excessive volume of blood received in culture bottles Performed at [facility], [address], [place], [place] [address]    Culture   Final    NO GROWTH 5 DAYS Performed at [facility], [address] [place], [place] [address]    Report Status 05/05/2023 FINAL  Final  Blood culture (routine x 2)     Status: None   Collection Time: 04/29/23  7:40 PM   Specimen: BLOOD LEFT ARM  Result Value Ref Range Status   Specimen Description   Final    BLOOD LEFT ARM BOTTLES DRAWN AEROBIC AND ANAEROBIC Performed at [facility], [address] [place], [place] [address]    Special Requests   Final    Blood Culture results may not be optimal due to an inadequate volume of blood received in culture bottles Performed at [facility], [address] [place], [place] [address]    Culture   Final    NO GROWTH 5 DAYS Performed at [facility], [address] [place], [place] [address]    Report Status 05/05/2023 FINAL  Final  Resp panel by RT-PCR (RSV, Flu A&B, Covid)     Status: None   Collection Time: 04/30/23  6:45 AM  Result Value Ref Range Status   SARS Coronavirus 2 by RT PCR NEGATIVE NEGATIVE Final    Comment: (NOTE) SARS-CoV-2 target nucleic acids are NOT DETECTED.  The SARS-CoV-2 RNA is generally detectable in upper respiratory specimens during the acute phase of infection. The lowest concentration of SARS-CoV-2 viral copies this assay can detect is 138 copies/mL. A negative result does not preclude SARS-Cov-2 infection and should not be used as the sole basis for treatment or other patient management decisions. A negative result may occur with  improper specimen collection/handling, submission of specimen other than nasopharyngeal swab, presence of viral mutation(s) within the areas targeted by this assay, and inadequate number of viral copies(<138 copies/mL). A negative result must be combined with clinical observations, patient history, and epidemiological information. The expected result is Negative.  Fact Sheet for Patients:  BloggerCourse.com  Fact Sheet for Healthcare Providers:   SeriousBroker.it  This test is no t yet approved or cleared by the [place] FDA and  has been authorized for detection and/or diagnosis of SARS-CoV-2 by FDA under an Emergency Use Authorization (EUA). This EUA will remain  in effect (meaning this test can be used) for the duration of the COVID-19 declaration under Section 564(b)(1) of the Act, 21 U.S.C.section 360bbb-3(b)(1), unless the authorization is terminated  or revoked sooner.       Influenza A by PCR NEGATIVE NEGATIVE Final   Influenza B by PCR NEGATIVE NEGATIVE Final    Comment: (NOTE) The Xpert Xpress SARS-CoV-2/FLU/RSV plus assay is intended as an aid in the diagnosis of influenza from Nasopharyngeal swab specimens and should not be used as a sole basis for treatment. Nasal washings and aspirates are unacceptable for Xpert Xpress SARS-CoV-2/FLU/RSV testing.  Fact Sheet for Patients: BloggerCourse.com  Fact Sheet for Healthcare Providers: SeriousBroker.it  This test is not yet approved or cleared by the [place] FDA and has been authorized for detection and/or diagnosis of SARS-CoV-2 by FDA under an Emergency Use Authorization (EUA). This EUA will remain in effect (meaning this test can be used) for the duration of the COVID-19 declaration under Section 564(b)(1) of the Act, 21 U.S.C. section 360bbb-3(b)(1), unless the authorization is terminated or revoked.     Resp Syncytial Virus by PCR NEGATIVE NEGATIVE Final    Comment: (NOTE) Fact Sheet for Patients: BloggerCourse.com  Fact Sheet for Healthcare Providers: SeriousBroker.it  This test is not yet approved or cleared by the [place] FDA and has been authorized for detection and/or diagnosis of SARS-CoV-2 by FDA under an Emergency Use Authorization (EUA). This EUA will remain in effect (meaning this test can be used) for  the duration of the COVID-19 declaration under Section 564(b)(1) of the Act, 21 U.S.C. section 360bbb-3(b)(1), unless the authorization is terminated or revoked.  Performed at [facility], [address] [place], [place] [address]     Labs: CBC:  Recent Labs  Lab 05/02/23 0830 05/02/23 2127 05/03/23 0450 05/04/23 0438 05/05/23 0528 05/06/23 0527  WBC DUP 0.5* 0.5* 0.9* 2.0* 6.8  NEUTROABS PENDING  --  0.1* 0.2* 0.6* 4.1  HGB DUP 9.4* 9.4* 9.1* 8.3* 8.7*  HCT DUP 27.6* 27.3* 26.5* 24.7* 25.9*  MCV DUP 88.2 88.9 89.2 89.5 89.9  PLT DUP 22* 23* 29* 50* 91*   Basic Metabolic Panel: Recent Labs  Lab 04/29/23 1930 04/30/23 0828 05/01/23 0747 05/04/23 0438  NA 135 136 137 137  K 3.6 3.6 3.7 3.6  CL 103 104 105 102  CO2 24 21* 23 26  GLUCOSE 103* 122* 98 96  BUN 10 7 9 9   CREATININE 0.53 0.57 0.59 0.58  CALCIUM 9.0 8.7* 8.9 9.1  MG  --  1.9  --   --   PHOS  --  4.2  --   --    Liver Function Tests: Recent Labs  Lab 04/29/23 1930 04/30/23 0828 05/01/23 0747 05/04/23 0438  AST 14* 17 13* 13*  ALT 13 10 11 12   ALKPHOS 60 53 53 54  BILITOT 0.7 1.1 1.0 0.8  PROT 7.6 6.6 7.2 6.8  ALBUMIN 4.1 3.6 3.7 3.3*    Discharge time spent: 35 minutes.  Signed: Jacquelin Hawking, MD [facility] 05/06/2023

## 2023-05-06 NOTE — Telephone Encounter (Signed)
Scheduled appointments per staff message. Left voicemail.

## 2023-05-09 ENCOUNTER — Telehealth: Payer: Self-pay

## 2023-05-09 NOTE — Transitions of Care (Post Inpatient/ED Visit) (Unsigned)
   05/09/2023  Name: Suzanne Tucker MRN: [id number] DOB: 07-27-1981  Today's TOC FU Call Status: Today's TOC FU Call Status:: Unsuccessful Call (1st Attempt) Unsuccessful Call (1st Attempt) Date: 05/09/23  Attempted to reach the patient regarding the most recent Inpatient/ED visit.  Follow Up Plan: Additional outreach attempts will be made to reach the patient to complete the Transitions of Care (Post Inpatient/ED visit) call.   Signature Karena Addison, LPN [facility] Nurse Health Advisor Direct Dial [phone]

## 2023-05-10 ENCOUNTER — Inpatient Hospital Stay (HOSPITAL_BASED_OUTPATIENT_CLINIC_OR_DEPARTMENT_OTHER): Payer: Managed Care, Other (non HMO) | Admitting: Adult Health

## 2023-05-10 ENCOUNTER — Inpatient Hospital Stay: Payer: Managed Care, Other (non HMO) | Attending: Hematology and Oncology

## 2023-05-10 ENCOUNTER — Encounter: Payer: Self-pay | Admitting: Adult Health

## 2023-05-10 VITALS — BP 121/76 | HR 93 | Temp 97.7°F | Resp 17 | Wt 145.8 lb

## 2023-05-10 DIAGNOSIS — Z5111 Encounter for antineoplastic chemotherapy: Secondary | ICD-10-CM | POA: Diagnosis present

## 2023-05-10 DIAGNOSIS — Z79899 Other long term (current) drug therapy: Secondary | ICD-10-CM | POA: Diagnosis not present

## 2023-05-10 DIAGNOSIS — C50311 Malignant neoplasm of lower-inner quadrant of right female breast: Secondary | ICD-10-CM | POA: Insufficient documentation

## 2023-05-10 DIAGNOSIS — Z171 Estrogen receptor negative status [ER-]: Secondary | ICD-10-CM | POA: Diagnosis not present

## 2023-05-10 DIAGNOSIS — Z95828 Presence of other vascular implants and grafts: Secondary | ICD-10-CM

## 2023-05-10 LAB — CMP (CANCER CENTER ONLY)
ALT: 13 U/L (ref 0–44)
AST: 19 U/L (ref 15–41)
Albumin: 4.1 g/dL (ref 3.5–5.0)
Alkaline Phosphatase: 66 U/L (ref 38–126)
Anion gap: 5 (ref 5–15)
BUN: 10 mg/dL (ref 6–20)
CO2: 30 mmol/L (ref 22–32)
Calcium: 9 mg/dL (ref 8.9–10.3)
Chloride: 104 mmol/L (ref 98–111)
Creatinine: 0.58 mg/dL (ref 0.44–1.00)
GFR, Estimated: >60 mL/min (ref >60)
Glucose, Bld: 118 mg/dL — ABNORMAL HIGH (ref 70–99)
Potassium: 4 mmol/L (ref 3.5–5.1)
Sodium: 139 mmol/L (ref 135–145)
Total Bilirubin: 0.3 mg/dL (ref 0.3–1.2)
Total Protein: 7.1 g/dL (ref 6.5–8.1)

## 2023-05-10 LAB — CBC WITH DIFFERENTIAL (CANCER CENTER ONLY)
Abs Immature Granulocytes: 1 10*3/uL — ABNORMAL HIGH (ref 0.00–0.07)
Basophils Absolute: 0.1 10*3/uL (ref 0.0–0.1)
Basophils Relative: 1 %
Eosinophils Absolute: 0 10*3/uL (ref 0.0–0.5)
Eosinophils Relative: 0 %
HCT: 27.2 % — ABNORMAL LOW (ref 36.0–46.0)
Hemoglobin: 9.2 g/dL — ABNORMAL LOW (ref 12.0–15.0)
Immature Granulocytes: 16 %
Lymphocytes Relative: 19 %
Lymphs Abs: 1.2 10*3/uL (ref 0.7–4.0)
MCH: 30.8 pg (ref 26.0–34.0)
MCHC: 33.8 g/dL (ref 30.0–36.0)
MCV: 91 fL (ref 80.0–100.0)
Monocytes Absolute: 1 10*3/uL (ref 0.1–1.0)
Monocytes Relative: 16 %
Neutro Abs: 3.1 10*3/uL (ref 1.7–7.7)
Neutrophils Relative %: 48 %
Platelet Count: 239 10*3/uL (ref 150–400)
RBC: 2.99 MIL/uL — ABNORMAL LOW (ref 3.87–5.11)
RDW: 14 % (ref 11.5–15.5)
Smear Review: NORMAL
WBC Count: 6.4 10*3/uL (ref 4.0–10.5)
nRBC: 0.9 % — ABNORMAL HIGH (ref 0.0–0.2)

## 2023-05-10 LAB — SAMPLE TO BLOOD BANK

## 2023-05-10 MED ORDER — SODIUM CHLORIDE 0.9% FLUSH
10.0000 mL | Freq: Once | INTRAVENOUS | Status: AC
  Administered 2023-05-10: 10 mL

## 2023-05-10 MED ORDER — HEPARIN SOD (PORK) LOCK FLUSH 100 UNIT/ML IV SOLN
500.0000 [IU] | Freq: Once | INTRAVENOUS | Status: AC
  Administered 2023-05-10: 500 [IU]

## 2023-05-10 NOTE — Progress Notes (Signed)
Patient Care Team: Serena Croissant, MD as PCP - General (Hematology and Oncology) Corky Crafts, MD as PCP - Cardiology (Cardiology) Duke Salvia, MD as PCP - Electrophysiology (Cardiology) Serena Croissant, MD as Consulting Physician (Hematology and Oncology) Lonie Peak, MD as Attending Physician (Radiation Oncology) [name], MD as Consulting Physician (General Surgery) Donnelly Angelica, RN as Oncology Nurse Navigator Pershing Proud, RN as Oncology Nurse Navigator  DIAGNOSIS:  Encounter Diagnosis  Name Primary?   Malignant neoplasm of lower-inner quadrant of right breast of female, estrogen receptor negative (HCC) Yes    SUMMARY OF ONCOLOGIC HISTORY: Oncology History  Malignant neoplasm of lower-inner quadrant of right breast of female, estrogen receptor negative (HCC)  01/10/2023 Initial Diagnosis   Palpable lump right axilla x 2 weeks bulky axillary lymphadenopathy ultrasound breast: 2 masses at 4 o'clock position 1.1 cm and 0.6 cm multiple axillary lymph nodes largest 4.5 cm also level 2 and 3 (subpectoral lymph node as well): Biopsy: Grade 3 IDC ER 0%, PR 0%, Ki-67 95%, HER2 1+   01/26/2023 Genetic Testing   Negative [employer] Custom Panel (37 genes).  Report date is 01/26/2023.    The [employer] Custom Cancers + RNA Panel includes sequencing, deletion/duplication, and RNA analysis of the following 37 genes: APC, ATM, AXIN2, BARD1, BMPR1A, BRCA1, BRCA2, BRIP1, CDH1, CDK4*, CDKN2A (p14ARF)*, CDKN2A (p16INK4a), CHEK2, CTNNA1, DICER1, EPCAM*, FH, GREM1*, HOXB13*, MBD4*, MLH1, MSH2, MSH3, MSH6, MUTYH, NF1, NTHL1, PALB2, PMS2, POLD1, POLE, PTEN, RAD51C, RAD51D, SMAD4, SMARCA4, STK11, TP53.  *Genes without RNA analysis.    01/28/2023 -  Chemotherapy   Patient is on Treatment Plan : BREAST Pembrolizumab (200) D1 + Carboplatin (1.5) D1,8,15 + Paclitaxel (80) D1,8,15 q21d X 4 cycles / Pembrolizumab (200) D1 + AC D1 q21d x 4 cycles       CHIEF COMPLIANT:  Cycle 2 Adriamycin  Cytoxan Keytruda    INTERVAL HISTORY: Suzanne Tucker is a 42 y.o. female is here because of recent diagnosis of right breast cancer.  She presents to the clinic for a follow-up for cycle 2 of Adriamycin Cytoxan and Keytruda. After cycle 1 she was admitted to the hospital with neutropenic fever and was treated with antibiotics and she was able to recover and was able to be discharged home.  She was pancytopenic during the hospitalization.  She is experiencing menopausal symptoms with profound night sweats and occasional hot flashes as well.   ALLERGIES:  is allergic to morphine; tessalon [benzonatate]; hydrocodone; latex; antihistamines, loratadine-type; codeine; and tramadol.  MEDICATIONS:  Current Outpatient Medications  Medication Sig Dispense Refill   acetaminophen (TYLENOL) 500 MG tablet Take 1,000 mg by mouth every 6 (six) hours as needed for mild pain or moderate pain.     calcium carbonate (TUMS - DOSED IN MG ELEMENTAL CALCIUM) 500 MG chewable tablet Chew 1,000 mg by mouth as needed for indigestion or heartburn.     fexofenadine (ALLEGRA) 180 MG tablet Take 180 mg by mouth as needed for allergies or rhinitis.     fluticasone (FLONASE) 50 MCG/ACT nasal spray Place 2 sprays into both nostrils daily. (Patient taking differently: Place 2 sprays into both nostrils daily as needed for allergies or rhinitis.) 16 g 0   ondansetron (ZOFRAN) 8 MG tablet Take 1 tablet (8 mg total) by mouth every 8 (eight) hours as needed for nausea or vomiting. Start on the third day after chemotherapy. 30 tablet 1   prochlorperazine (COMPAZINE) 10 MG tablet Take 1 tablet (10 mg total) by mouth  every 6 (six) hours as needed for nausea or vomiting. 30 tablet 1   No current facility-administered medications for this visit.    PHYSICAL EXAMINATION: ECOG PERFORMANCE STATUS: 1 - Symptomatic but completely ambulatory  Vitals:   05/13/23 1139  BP: 125/80  Pulse: 88  Resp: 18  Temp: (!) 97.5 F (36.4 C)   SpO2: 100%   Filed Weights   05/13/23 1139  Weight: 144 lb 9.6 oz (65.6 kg)     LABORATORY DATA:  I have reviewed the data as listed    Latest Ref Rng & Units 05/13/2023   11:03 AM 05/10/2023    1:47 PM 05/04/2023    4:38 AM  CMP  Glucose 70 - 99 mg/dL 161  096  96   BUN 6 - 20 mg/dL 14  10  9    Creatinine 0.44 - 1.00 mg/dL 0.45  4.09  8.11   Sodium 135 - 145 mmol/L 138  139  137   Potassium 3.5 - 5.1 mmol/L 4.0  4.0  3.6   Chloride 98 - 111 mmol/L 100  104  102   CO2 22 - 32 mmol/L 28  30  26    Calcium 8.9 - 10.3 mg/dL 9.9  9.0  9.1   Total Protein 6.5 - 8.1 g/dL 7.4  7.1  6.8   Total Bilirubin 0.3 - 1.2 mg/dL 0.4  0.3  0.8   Alkaline Phos 38 - 126 U/L 63  66  54   AST 15 - 41 U/L 16  19  13    ALT 0 - 44 U/L 12  13  12      Lab Results  Component Value Date   WBC 4.7 05/13/2023   HGB 10.6 (L) 05/13/2023   HCT 31.2 (L) 05/13/2023   MCV 90.2 05/13/2023   PLT 359 05/13/2023   NEUTROABS 2.9 05/13/2023    ASSESSMENT & PLAN:  Malignant neoplasm of lower-inner quadrant of right breast of female, estrogen receptor negative (HCC) 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%, HER2 1+  01/25/2023: PET/CT: Right breast cancer, right axilla, retropectoral and right cervical lymph nodes   Treatment plan: Neoadjuvant chemotherapy with Taxol carbo Keytruda weekly x 12 followed by Adriamycin Cytoxan Keytruda followed by Rande Lawman maintenance Breast conserving surgery with ALND Adjuvant radiation therapy ------------------------------------------------------------------------------------------------------------------------------------------- Current treatment: Completed 12 cycles of Taxol carbo (with Keytruda every 3 weeks), today cycle 2 Adriamycin  Cytoxan Keytruda Chemo toxicities: Anxiety/panic attack: Resolved Mild fatigue Hospitalization: 04/29/2023-05/06/2023: Neutropenic fever.  We reduced the dosage of chemotherapy today. Chemotherapy-induced anemia: Today's hemoglobin has improved to 10.6.  (She received 2 units of PRBC in the hospital)  Follow-up in 1 week for toxicity check with Mardella Layman Return to clinic in 3 weeks for cycle 3 of Adriamycin Cytoxan and Keytruda    No orders of the defined types were placed in this encounter.  The patient has a good understanding of the overall plan. she agrees with it. she will call with any problems that may develop before the next visit here. Total time spent: 30 mins including face to face time and time spent for planning, charting and co-ordination of care   Tamsen Meek, MD 05/13/23    I Janan Ridge am acting as a Neurosurgeon for  Dr.   I have reviewed the above documentation for accuracy and completeness, and I agree with the above.

## 2023-05-10 NOTE — Transitions of Care (Post Inpatient/ED Visit) (Unsigned)
   05/10/2023  Name: Suzanne Tucker MRN: [id number] DOB: [birth year]  Today's TOC FU Call Status: Today's TOC FU Call Status:: Unsuccessful Call (2nd Attempt) Unsuccessful Call (1st Attempt) Date: 05/09/23 Unsuccessful Call (2nd Attempt) Date: 05/10/23  Attempted to reach the patient regarding the most recent Inpatient/ED visit.  Follow Up Plan: Additional outreach attempts will be made to reach the patient to complete the Transitions of Care (Post Inpatient/ED visit) call.   Signature Karena Addison, LPN [facility] Nurse Health Advisor Direct Dial [phone]

## 2023-05-11 ENCOUNTER — Encounter: Payer: Self-pay | Admitting: Hematology and Oncology

## 2023-05-11 NOTE — Transitions of Care (Post Inpatient/ED Visit) (Signed)
   05/11/2023  Name: Suzanne Tucker MRN: [id number] DOB: 1981-03-04  Today's TOC FU Call Status: Today's TOC FU Call Status:: Unsuccessful Call (3rd Attempt) Unsuccessful Call (1st Attempt) Date: 05/09/23 Unsuccessful Call (2nd Attempt) Date: 05/10/23 Unsuccessful Call (3rd Attempt) Date: 05/11/23  Attempted to reach the patient regarding the most recent Inpatient/ED visit.  Follow Up Plan: No further outreach attempts will be made at this time. We have been unable to contact the patient.  Signature  TB,CMA

## 2023-05-12 ENCOUNTER — Encounter: Payer: Self-pay | Admitting: Hematology and Oncology

## 2023-05-12 MED FILL — Dexamethasone Sodium Phosphate Inj 100 MG/10ML: INTRAMUSCULAR | Qty: 1 | Status: AC

## 2023-05-12 MED FILL — Fosaprepitant Dimeglumine For IV Infusion 150 MG (Base Eq): INTRAVENOUS | Qty: 5 | Status: AC

## 2023-05-12 NOTE — Progress Notes (Signed)
[facility] Cancer Follow up:    Suzanne Croissant, MD [address] [place] [address]   DIAGNOSIS:  Cancer Staging  Malignant neoplasm of lower-inner quadrant of right breast of female, estrogen receptor negative (HCC) Staging form: Breast, AJCC 8th Edition - Clinical: Stage IIIC (cT2, cN3c, cM0, G3, ER-, PR-, HER2-) - Unsigned Histologic grading system: 3 grade system   SUMMARY OF ONCOLOGIC HISTORY: Oncology History  Malignant neoplasm of lower-inner quadrant of right breast of female, estrogen receptor negative (HCC)  01/10/2023 Initial Diagnosis   Palpable lump right axilla x 2 weeks bulky axillary lymphadenopathy ultrasound breast: 2 masses at 4 o'clock position 1.1 cm and 0.6 cm multiple axillary lymph nodes largest 4.5 cm also level 2 and 3 (subpectoral lymph node as well): Biopsy: Grade 3 IDC ER 0%, PR 0%, Ki-67 95%, HER2 1+   01/26/2023 Genetic Testing   Negative [employer] Custom Panel (37 genes).  Report date is 01/26/2023.    The [employer] Custom Cancers + RNA Panel includes sequencing, deletion/duplication, and RNA analysis of the following 37 genes: APC, ATM, AXIN2, BARD1, BMPR1A, BRCA1, BRCA2, BRIP1, CDH1, CDK4*, CDKN2A (p14ARF)*, CDKN2A (p16INK4a), CHEK2, CTNNA1, DICER1, EPCAM*, FH, GREM1*, HOXB13*, MBD4*, MLH1, MSH2, MSH3, MSH6, MUTYH, NF1, NTHL1, PALB2, PMS2, POLD1, POLE, PTEN, RAD51C, RAD51D, SMAD4, SMARCA4, STK11, TP53.  *Genes without RNA analysis.    01/28/2023 -  Chemotherapy   Patient is on Treatment Plan : BREAST Pembrolizumab (200) D1 + Carboplatin (1.5) D1,8,15 + Paclitaxel (80) D1,8,15 q21d X 4 cycles / Pembrolizumab (200) D1 + AC D1 q21d x 4 cycles       CURRENT THERAPY: Pembrolizumab, Adriamycin, Cytoxan  INTERVAL HISTORY: Suzanne Tucker 42 y.o. female returns for follow-up and evaluation after being admitted in the hospital for febrile neutropenia and pancytopenia from April 29, 2023 through May 06, 2023.  She has  recovered quite well.  Her labs today show complete resolution of her neutropenia, leukopenia, thrombocytopenia, and an improvement of her hemoglobin to 9.2.  She is starting to feel better.  She does feel little bit tired but otherwise is doing well today.  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   Antineoplastic chemotherapy induced pancytopenia (HCC) 05/02/2023   Neutropenic fever (HCC) 04/30/2023   Neutropenia (HCC) 04/29/2023   Genetic testing 02/08/2023   Port-A-Cath in place 01/28/2023   Malignant neoplasm of lower-inner quadrant of right breast of female, estrogen receptor negative (HCC) 01/17/2023   Axillary mass, right 12/28/2022   Palpitations 11/23/2022   Dizzy spells 03/04/2022   Carpal tunnel syndrome on right 11/02/2017   MVP (mitral valve prolapse) 10/28/2017    is allergic to morphine; tessalon [benzonatate]; hydrocodone; latex; antihistamines, loratadine-type; codeine; and tramadol.  MEDICAL HISTORY: Past Medical History:  Diagnosis Date   Anginal pain (HCC) 02/2019   related to MVP   Back pain    Breast cancer (HCC)    Depression    Dysrhythmia    MVP   Mitral prolapse     SURGICAL HISTORY: Past Surgical History:  Procedure Laterality Date   BREAST SURGERY Right 2024   Breast biopsy x 2   CESAREAN SECTION  03/30/2008   PORTACATH PLACEMENT N/A 01/27/2023   Procedure: INSERTION PORT-A-CATH;  Surgeon: [name], MD;  Location: [facility];  Service: General;  Laterality: N/A;  leaving accessed   ROBOTIC ASSISTED TOTAL HYSTERECTOMY Bilateral 03/15/2019   Procedure: XI ROBOTIC ASSISTED TOTAL HYSTERECTOMY with Bilateral Salpingectomy;  Surgeon: Silverio Lay, MD;  Location: [facility];  Service: Gynecology;  Laterality: Bilateral;   TUBAL LIGATION  2017    SOCIAL HISTORY: Social History   Socioeconomic History   Marital status: Married    Spouse name: Not on file   Number  of children: Not on file   Years of education: Not on file   Highest education level: Master's degree (e.g., MA, MS, MEng, MEd, MSW, MBA)  Occupational History   Not on file  Tobacco Use   Smoking status: Never   Smokeless tobacco: Never  Vaping Use   Vaping status: Never Used  Substance and Sexual Activity   Alcohol use: Not Currently    Comment:     Drug use: No   Sexual activity: Yes    Birth control/protection: Surgical  Other Topics Concern   Not on file  Social History Narrative   Not on file   Social Determinants of Health   Financial Resource Strain: Low Risk  (12/27/2022)   Overall Financial Resource Strain (CARDIA)    Difficulty of Paying Living Expenses: Not very hard  Food Insecurity: No Food Insecurity (04/29/2023)   Hunger Vital Sign    Worried About Running Out of Food in the Last Year: Never true    Ran Out of Food in the Last Year: Never true  Transportation Needs: No Transportation Needs (04/29/2023)   PRAPARE - Administrator, Civil Service (Medical): No    Lack of Transportation (Non-Medical): No  Physical Activity: Insufficiently Active (12/27/2022)   Exercise Vital Sign    Days of Exercise per Week: 4 days    Minutes of Exercise per Session: 20 min  Stress: No Stress Concern Present (12/27/2022)   [employer] of Occupational Health - Occupational Stress Questionnaire    Feeling of Stress : Only a little  Social Connections: Moderately Isolated (12/27/2022)   Social Connection and Isolation Panel [NHANES]    Frequency of Communication with Friends and Family: More than three times a week    Frequency of Social Gatherings with Friends and Family: Once a week    Attends Religious Services: Never    Database administrator or Organizations: No    Attends Engineer, structural: Not on file    Marital Status: Married  Catering manager Violence: Not At Risk (04/29/2023)   Humiliation, Afraid, Rape, and Kick questionnaire    Fear of  Current or Ex-Partner: No    Emotionally Abused: No    Physically Abused: No    Sexually Abused: No    FAMILY HISTORY: Family History  Problem Relation Age of Onset   Stroke Mother    Alzheimer's disease Father    Diabetes Maternal Grandmother    Heart disease Maternal Grandmother    Heart attack Maternal Grandmother    Diabetes Maternal Grandfather    Heart disease Maternal Grandfather    Alzheimer's disease Paternal Grandmother    Dementia Paternal Grandmother    Alzheimer's disease Paternal Grandfather    Dementia Paternal Grandfather    Alcohol abuse Paternal Grandfather     Review of Systems  Constitutional:  Positive for fatigue. Negative for appetite change, chills, fever and unexpected weight change.  HENT:   Negative for hearing loss, lump/mass and trouble swallowing.   Eyes:  Negative for eye problems and icterus.  Respiratory:  Negative for chest tightness, cough and shortness of breath.   Cardiovascular:  Negative for chest pain, leg swelling and palpitations.  Gastrointestinal:  Negative for abdominal distention, abdominal pain, constipation, diarrhea, nausea and vomiting.  Endocrine: Negative for hot flashes.  Genitourinary:  Negative for difficulty urinating.   Musculoskeletal:  Negative for arthralgias.  Skin:  Negative for itching and rash.  Neurological:  Negative for dizziness, extremity weakness, headaches and numbness.  Hematological:  Negative for adenopathy. Does not bruise/bleed easily.  Psychiatric/Behavioral:  Negative for depression. The patient is not nervous/anxious.       PHYSICAL EXAMINATION    Vitals:   05/10/23 1501  BP: 121/76  Pulse: 93  Resp: 17  Temp: 97.7 F (36.5 C)  SpO2: 100%    Physical Exam Constitutional:      General: She is not in acute distress.    Appearance: Normal appearance. She is not toxic-appearing.  HENT:     Head: Normocephalic and atraumatic.     Mouth/Throat:     Mouth: Mucous membranes are moist.      Pharynx: Oropharynx is clear. No oropharyngeal exudate or posterior oropharyngeal erythema.  Eyes:     General: No scleral icterus. Cardiovascular:     Rate and Rhythm: Normal rate and regular rhythm.     Pulses: Normal pulses.     Heart sounds: Normal heart sounds.  Pulmonary:     Effort: Pulmonary effort is normal.     Breath sounds: Normal breath sounds.  Abdominal:     General: Abdomen is flat. Bowel sounds are normal. There is no distension.     Palpations: Abdomen is soft.     Tenderness: There is no abdominal tenderness.  Musculoskeletal:        General: No swelling.     Cervical back: Neck supple.  Lymphadenopathy:     Cervical: No cervical adenopathy.  Skin:    General: Skin is warm and dry.     Findings: No rash.  Neurological:     General: No focal deficit present.     Mental Status: She is alert.  Psychiatric:        Mood and Affect: Mood normal.        Behavior: Behavior normal.     LABORATORY DATA:  CBC    Component Value Date/Time   WBC 6.4 05/10/2023 1347   WBC 6.8 05/06/2023 0527   RBC 2.99 (L) 05/10/2023 1347   HGB 9.2 (L) 05/10/2023 1347   HGB 11.6 04/08/2022 1037   HCT 27.2 (L) 05/10/2023 1347   HCT 35.8 04/08/2022 1037   PLT 239 05/10/2023 1347   PLT 244 04/08/2022 1037   MCV 91.0 05/10/2023 1347   MCV 89 04/08/2022 1037   MCH 30.8 05/10/2023 1347   MCHC 33.8 05/10/2023 1347   RDW 14.0 05/10/2023 1347   RDW 12.1 04/08/2022 1037   LYMPHSABS 1.2 05/10/2023 1347   LYMPHSABS 1.3 04/08/2022 1037   MONOABS 1.0 05/10/2023 1347   EOSABS 0.0 05/10/2023 1347   EOSABS 0.1 04/08/2022 1037   BASOSABS 0.1 05/10/2023 1347   BASOSABS 0.0 04/08/2022 1037    ASSESSMENT and THERAPY PLAN:   Malignant neoplasm of lower-inner quadrant of right breast of female, estrogen receptor negative (HCC) Suzanne Tucker is a 42 year old woman with 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  She is doing well.   I encouraged her to keep her appointment Friday with Dr. [name] to repeat labs and discuss dose reduction of her chemotherapy.  RTC on Friday, 05/13/2023 for labs, f/u with Dr. [name], and treatment.     All questions were answered. The patient  knows to call the clinic with any problems, questions or concerns. We can certainly see the patient much sooner if necessary.  Total encounter time:20 minutes*in face-to-face visit time, chart review, lab review, care coordination, order entry, and documentation of the encounter time.  Lillard Anes, NP 05/12/23 5:22 PM Medical Oncology and Hematology [facility] [address] [place], [place] [address] Tel. [phone]    Fax. [phone]  *Total Encounter Time as defined by the Centers for Medicare and Medicaid Services includes, in addition to the face-to-face time of a patient visit (documented in the note above) non-face-to-face time: obtaining and reviewing outside history, ordering and reviewing medications, tests or procedures, care coordination (communications with other health care professionals or caregivers) and documentation in the medical record.

## 2023-05-12 NOTE — Assessment & Plan Note (Signed)
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  She is doing well.  I encouraged her to keep her appointment Friday with Dr. [name] to repeat labs and discuss dose reduction of her chemotherapy.  RTC on Friday, 05/13/2023 for labs, f/u with Dr. [name], and treatment.

## 2023-05-13 ENCOUNTER — Encounter: Payer: Self-pay | Admitting: Hematology and Oncology

## 2023-05-13 ENCOUNTER — Inpatient Hospital Stay (HOSPITAL_BASED_OUTPATIENT_CLINIC_OR_DEPARTMENT_OTHER): Payer: Managed Care, Other (non HMO) | Admitting: Hematology and Oncology

## 2023-05-13 ENCOUNTER — Inpatient Hospital Stay: Payer: Managed Care, Other (non HMO)

## 2023-05-13 ENCOUNTER — Encounter: Payer: Self-pay | Admitting: General Practice

## 2023-05-13 ENCOUNTER — Other Ambulatory Visit: Payer: Self-pay

## 2023-05-13 VITALS — BP 125/80 | HR 88 | Temp 97.5°F | Resp 18 | Ht 61.0 in | Wt 144.6 lb

## 2023-05-13 DIAGNOSIS — Z171 Estrogen receptor negative status [ER-]: Secondary | ICD-10-CM

## 2023-05-13 DIAGNOSIS — Z95828 Presence of other vascular implants and grafts: Secondary | ICD-10-CM

## 2023-05-13 DIAGNOSIS — C50311 Malignant neoplasm of lower-inner quadrant of right female breast: Secondary | ICD-10-CM | POA: Diagnosis not present

## 2023-05-13 LAB — CMP (CANCER CENTER ONLY)
ALT: 12 U/L (ref 0–44)
AST: 16 U/L (ref 15–41)
Albumin: 4.3 g/dL (ref 3.5–5.0)
Alkaline Phosphatase: 63 U/L (ref 38–126)
Anion gap: 10 (ref 5–15)
BUN: 14 mg/dL (ref 6–20)
CO2: 28 mmol/L (ref 22–32)
Calcium: 9.9 mg/dL (ref 8.9–10.3)
Chloride: 100 mmol/L (ref 98–111)
Creatinine: 0.65 mg/dL (ref 0.44–1.00)
GFR, Estimated: >60 mL/min (ref >60)
Glucose, Bld: 119 mg/dL — ABNORMAL HIGH (ref 70–99)
Potassium: 4 mmol/L (ref 3.5–5.1)
Sodium: 138 mmol/L (ref 135–145)
Total Bilirubin: 0.4 mg/dL (ref 0.3–1.2)
Total Protein: 7.4 g/dL (ref 6.5–8.1)

## 2023-05-13 LAB — CBC WITH DIFFERENTIAL (CANCER CENTER ONLY)
Abs Immature Granulocytes: 0.16 10*3/uL — ABNORMAL HIGH (ref 0.00–0.07)
Basophils Absolute: 0 10*3/uL (ref 0.0–0.1)
Basophils Relative: 0 %
Eosinophils Absolute: 0 10*3/uL (ref 0.0–0.5)
Eosinophils Relative: 0 %
HCT: 31.2 % — ABNORMAL LOW (ref 36.0–46.0)
Hemoglobin: 10.6 g/dL — ABNORMAL LOW (ref 12.0–15.0)
Immature Granulocytes: 3 %
Lymphocytes Relative: 21 %
Lymphs Abs: 1 10*3/uL (ref 0.7–4.0)
MCH: 30.6 pg (ref 26.0–34.0)
MCHC: 34 g/dL (ref 30.0–36.0)
MCV: 90.2 fL (ref 80.0–100.0)
Monocytes Absolute: 0.7 10*3/uL (ref 0.1–1.0)
Monocytes Relative: 15 %
Neutro Abs: 2.9 10*3/uL (ref 1.7–7.7)
Neutrophils Relative %: 61 %
Platelet Count: 359 10*3/uL (ref 150–400)
RBC: 3.46 MIL/uL — ABNORMAL LOW (ref 3.87–5.11)
RDW: 14.6 % (ref 11.5–15.5)
WBC Count: 4.7 10*3/uL (ref 4.0–10.5)
nRBC: 0.8 % — ABNORMAL HIGH (ref 0.0–0.2)

## 2023-05-13 LAB — TSH: TSH: 1.576 u[IU]/mL (ref 0.350–4.500)

## 2023-05-13 MED ORDER — SODIUM CHLORIDE 0.9 % IV SOLN
200.0000 mg | Freq: Once | INTRAVENOUS | Status: AC
  Administered 2023-05-13: 200 mg via INTRAVENOUS
  Filled 2023-05-13: qty 200

## 2023-05-13 MED ORDER — PALONOSETRON HCL INJECTION 0.25 MG/5ML
0.2500 mg | Freq: Once | INTRAVENOUS | Status: AC
  Administered 2023-05-13: 0.25 mg via INTRAVENOUS
  Filled 2023-05-13: qty 5

## 2023-05-13 MED ORDER — HEPARIN SOD (PORK) LOCK FLUSH 100 UNIT/ML IV SOLN
500.0000 [IU] | Freq: Once | INTRAVENOUS | Status: AC | PRN
  Administered 2023-05-13: 500 [IU]

## 2023-05-13 MED ORDER — SODIUM CHLORIDE 0.9 % IV SOLN
150.0000 mg | Freq: Once | INTRAVENOUS | Status: AC
  Administered 2023-05-13: 150 mg via INTRAVENOUS
  Filled 2023-05-13: qty 150

## 2023-05-13 MED ORDER — SODIUM CHLORIDE 0.9 % IV SOLN
400.0000 mg/m2 | Freq: Once | INTRAVENOUS | Status: AC
  Administered 2023-05-13: 680 mg via INTRAVENOUS
  Filled 2023-05-13: qty 34

## 2023-05-13 MED ORDER — SODIUM CHLORIDE 0.9 % IV SOLN
10.0000 mg | Freq: Once | INTRAVENOUS | Status: AC
  Administered 2023-05-13: 10 mg via INTRAVENOUS
  Filled 2023-05-13: qty 10

## 2023-05-13 MED ORDER — SODIUM CHLORIDE 0.9% FLUSH
10.0000 mL | INTRAVENOUS | Status: DC | PRN
  Administered 2023-05-13: 10 mL

## 2023-05-13 MED ORDER — DOXORUBICIN HCL CHEMO IV INJECTION 2 MG/ML
40.0000 mg/m2 | Freq: Once | INTRAVENOUS | Status: AC
  Administered 2023-05-13: 68 mg via INTRAVENOUS
  Filled 2023-05-13: qty 34

## 2023-05-13 MED ORDER — SODIUM CHLORIDE 0.9 % IV SOLN: Freq: Once | INTRAVENOUS | Status: AC

## 2023-05-13 MED ORDER — SODIUM CHLORIDE 0.9% FLUSH
10.0000 mL | Freq: Once | INTRAVENOUS | Status: AC
  Administered 2023-05-13: 10 mL

## 2023-05-13 NOTE — Patient Instructions (Signed)
 [facility]  Discharge Instructions: Thank you for choosing [facility] to provide your oncology and hematology care.   If you have a lab appointment with the Cancer Center, please go directly to the Cancer Center and check in at the registration area.   Wear comfortable clothing and clothing appropriate for easy access to any Portacath or PICC line.   We strive to give you quality time with your provider. You may need to reschedule your appointment if you arrive late (15 or more minutes).  Arriving late affects you and other patients whose appointments are after yours.  Also, if you miss three or more appointments without notifying the office, you may be dismissed from the clinic at the provider's discretion.      For prescription refill requests, have your pharmacy contact our office and allow 72 hours for refills to be completed.    Today you received the following chemotherapy and/or immunotherapy agents Keytruda, Adriamycin, Cytoxan      To help prevent nausea and vomiting after your treatment, we encourage you to take your nausea medication as directed.  BELOW ARE SYMPTOMS THAT SHOULD BE REPORTED IMMEDIATELY: *FEVER GREATER THAN 100.4 F (38 C) OR HIGHER *CHILLS OR SWEATING *NAUSEA AND VOMITING THAT IS NOT CONTROLLED WITH YOUR NAUSEA MEDICATION *UNUSUAL SHORTNESS OF BREATH *UNUSUAL BRUISING OR BLEEDING *URINARY PROBLEMS (pain or burning when urinating, or frequent urination) *BOWEL PROBLEMS (unusual diarrhea, constipation, pain near the anus) TENDERNESS IN MOUTH AND THROAT WITH OR WITHOUT PRESENCE OF ULCERS (sore throat, sores in mouth, or a toothache) UNUSUAL RASH, SWELLING OR PAIN  UNUSUAL VAGINAL DISCHARGE OR ITCHING   Items with * indicate a potential emergency and should be followed up as soon as possible or go to the Emergency Department if any problems should occur.  Please show the CHEMOTHERAPY ALERT CARD or IMMUNOTHERAPY  ALERT CARD at check-in to the Emergency Department and triage nurse.  Should you have questions after your visit or need to cancel or reschedule your appointment, please contact [facility]  Dept: [phone]  and follow the prompts.  Office hours are 8:00 a.m. to 4:30 p.m. Monday - Friday. Please note that voicemails left after 4:00 p.m. may not be returned until the following business day.  We are closed weekends and major holidays. You have access to a nurse at all times for urgent questions. Please call the main number to the clinic Dept: [phone] and follow the prompts.   For any non-urgent questions, you may also contact your provider using MyChart. We now offer e-Visits for anyone 45 and older to request care online for non-urgent symptoms. For details visit [email or web address].   Also download the MyChart app! Go to the app store, search "MyChart", open the app, select , and log in with your MyChart username and password.  Doxorubicin Injection What is this medication? DOXORUBICIN (dox oh ROO bi sin) treats some types of cancer. It works by slowing down the growth of cancer cells. This medicine may be used for other purposes; ask your health care provider or pharmacist if you have questions. COMMON BRAND NAME(S): Adriamycin, Adriamycin PFS, Adriamycin RDF, Rubex What should I tell my care team before I take this medication? They need to know if you have any of these conditions: Heart disease History of low blood cell levels caused by a medication Liver disease Recent or ongoing radiation An unusual or allergic reaction to doxorubicin, other  medications, foods, dyes, or preservatives If you or your partner are pregnant or trying to get pregnant Breast-feeding How should I use this medication? This medication is injected into a vein. It is given by your care team in a hospital or clinic setting. Talk to your care team about the  use of this medication in children. Special care may be needed. Overdosage: If you think you have taken too much of this medicine contact a poison control center or emergency room at once. NOTE: This medicine is only for you. Do not share this medicine with others. What if I miss a dose? Keep appointments for follow-up doses. It is important not to miss your dose. Call your care team if you are unable to keep an appointment. What may interact with this medication? 6-mercaptopurine Paclitaxel Phenytoin St. John's wort Trastuzumab Verapamil This list may not describe all possible interactions. Give your health care provider a list of all the medicines, herbs, non-prescription drugs, or dietary supplements you use. Also tell them if you smoke, drink alcohol, or use illegal drugs. Some items may interact with your medicine. What should I watch for while using this medication? Your condition will be monitored carefully while you are receiving this medication. You may need blood work while taking this medication. This medication may make you feel generally unwell. This is not uncommon as chemotherapy can affect healthy cells as well as cancer cells. Report any side effects. Continue your course of treatment even though you feel ill unless your care team tells you to stop. There is a maximum amount of this medication you should receive throughout your life. The amount depends on the medical condition being treated and your overall health. Your care team will watch how much of this medication you receive. Tell your care team if you have taken this medication before. Your urine may turn red for a few days after your dose. This is not blood. If your urine is dark or brown, call your care team. In some cases, you may be given additional medications to help with side effects. Follow all directions for their use. This medication may increase your risk of getting an infection. Call your care team for advice if  you get a fever, chills, sore throat, or other symptoms of a cold or flu. Do not treat yourself. Try to avoid being around people who are sick. This medication may increase your risk to bruise or bleed. Call your care team if you notice any unusual bleeding. Talk to your care team about your risk of cancer. You may be more at risk for certain types of cancers if you take this medication. Talk to your care team if you or your partner may be pregnant. Serious birth defects can occur if you take this medication during pregnancy and for 6 months after the last dose. Contraception is recommended while taking this medication and for 6 months after the last dose. Your care team can help you find the option that works for you. If your partner can get pregnant, use a condom while taking this medication and for 6 months after the last dose. Do not breastfeed while taking this medication. This medication may cause infertility. Talk to your care team if you are concerned about your fertility. What side effects may I notice from receiving this medication? Side effects that you should report to your care team as soon as possible: Allergic reactions--skin rash, itching, hives, swelling of the face, lips, tongue, or throat Heart failure--shortness of  breath, swelling of the ankles, feet, or hands, sudden weight gain, unusual weakness or fatigue Heart rhythm changes--fast or irregular heartbeat, dizziness, feeling faint or lightheaded, chest pain, trouble breathing Infection--fever, chills, cough, sore throat, wounds that don't heal, pain or trouble when passing urine, general feeling of discomfort or being unwell Low red blood cell level--unusual weakness or fatigue, dizziness, headache, trouble breathing Painful swelling, warmth, or redness of the skin, blisters or sores at the infusion site Unusual bruising or bleeding Side effects that usually do not require medical attention (report to your care team if they  continue or are bothersome): Diarrhea Hair loss Nausea Pain, redness, or swelling with sores inside the mouth or throat Red urine This list may not describe all possible side effects. Call your doctor for medical advice about side effects. You may report side effects to FDA at 1-800-FDA-1088. Where should I keep my medication? This medication is given in a hospital or clinic. It will not be stored at home. NOTE: This sheet is a summary. It may not cover all possible information. If you have questions about this medicine, talk to your doctor, pharmacist, or health care provider.  2024 Elsevier/Gold Standard (2022-12-23 00:00:00)  Cyclophosphamide Injection What is this medication? CYCLOPHOSPHAMIDE (sye kloe FOSS fa mide) treats some types of cancer. It works by slowing down the growth of cancer cells. This medicine may be used for other purposes; ask your health care provider or pharmacist if you have questions. COMMON BRAND NAME(S): Cyclophosphamide, Cytoxan, Neosar What should I tell my care team before I take this medication? They need to know if you have any of these conditions: Heart disease Irregular heartbeat or rhythm Infection Kidney problems Liver disease Low blood cell levels (white cells, platelets, or red blood cells) Lung disease Previous radiation Trouble passing urine An unusual or allergic reaction to cyclophosphamide, other medications, foods, dyes, or preservatives Pregnant or trying to get pregnant Breast-feeding How should I use this medication? This medication is injected into a vein. It is given by your care team in a hospital or clinic setting. Talk to your care team about the use of this medication in children. Special care may be needed. Overdosage: If you think you have taken too much of this medicine contact a poison control center or emergency room at once. NOTE: This medicine is only for you. Do not share this medicine with others. What if I miss a  dose? Keep appointments for follow-up doses. It is important not to miss your dose. Call your care team if you are unable to keep an appointment. What may interact with this medication? Amphotericin B Amiodarone Azathioprine Certain antivirals for HIV or hepatitis Certain medications for blood pressure, such as enalapril, lisinopril, quinapril Cyclosporine Diuretics Etanercept Indomethacin Medications that relax muscles Metronidazole Natalizumab Tamoxifen Warfarin This list may not describe all possible interactions. Give your health care provider a list of all the medicines, herbs, non-prescription drugs, or dietary supplements you use. Also tell them if you smoke, drink alcohol, or use illegal drugs. Some items may interact with your medicine. What should I watch for while using this medication? This medication may make you feel generally unwell. This is not uncommon as chemotherapy can affect healthy cells as well as cancer cells. Report any side effects. Continue your course of treatment even though you feel ill unless your care team tells you to stop. You may need blood work while you are taking this medication. This medication may increase your risk of getting an  infection. Call your care team for advice if you get a fever, chills, sore throat, or other symptoms of a cold or flu. Do not treat yourself. Try to avoid being around people who are sick. Avoid taking medications that contain aspirin, acetaminophen, ibuprofen, naproxen, or ketoprofen unless instructed by your care team. These medications may hide a fever. Be careful brushing or flossing your teeth or using a toothpick because you may get an infection or bleed more easily. If you have any dental work done, tell your dentist you are receiving this medication. Drink water or other fluids as directed. Urinate often, even at night. Some products may contain alcohol. Ask your care team if this medication contains alcohol. Be sure  to tell all care teams you are taking this medicine. Certain medicines, like metronidazole and disulfiram, can cause an unpleasant reaction when taken with alcohol. The reaction includes flushing, headache, nausea, vomiting, sweating, and increased thirst. The reaction can last from 30 minutes to several hours. Talk to your care team if you wish to become pregnant or think you might be pregnant. This medication can cause serious birth defects if taken during pregnancy and for 1 year after the last dose. A negative pregnancy test is required before starting this medication. A reliable form of contraception is recommended while taking this medication and for 1 year after the last dose. Talk to your care team about reliable forms of contraception. Do not father a child while taking this medication and for 4 months after the last dose. Use a condom during this time period. Do not breast-feed while taking this medication or for 1 week after the last dose. This medication may cause infertility. Talk to your care team if you are concerned about your fertility. Talk to your care team about your risk of cancer. You may be more at risk for certain types of cancer if you take this medication. What side effects may I notice from receiving this medication? Side effects that you should report to your care team as soon as possible: Allergic reactions--skin rash, itching, hives, swelling of the face, lips, tongue, or throat Dry cough, shortness of breath or trouble breathing Heart failure--shortness of breath, swelling of the ankles, feet, or hands, sudden weight gain, unusual weakness or fatigue Heart muscle inflammation--unusual weakness or fatigue, shortness of breath, chest pain, fast or irregular heartbeat, dizziness, swelling of the ankles, feet, or hands Heart rhythm changes--fast or irregular heartbeat, dizziness, feeling faint or lightheaded, chest pain, trouble breathing Infection--fever, chills, cough, sore  throat, wounds that don't heal, pain or trouble when passing urine, general feeling of discomfort or being unwell Kidney injury--decrease in the amount of urine, swelling of the ankles, hands, or feet Liver injury--right upper belly pain, loss of appetite, nausea, light-colored stool, dark yellow or brown urine, yellowing skin or eyes, unusual weakness or fatigue Low red blood cell level--unusual weakness or fatigue, dizziness, headache, trouble breathing Low sodium level--muscle weakness, fatigue, dizziness, headache, confusion Red or dark brown urine Unusual bruising or bleeding Side effects that usually do not require medical attention (report to your care team if they continue or are bothersome): Hair loss Irregular menstrual cycles or spotting Loss of appetite Nausea Pain, redness, or swelling with sores inside the mouth or throat Vomiting This list may not describe all possible side effects. Call your doctor for medical advice about side effects. You may report side effects to FDA at 1-800-FDA-1088. Where should I keep my medication? This medication is given in a  hospital or clinic. It will not be stored at home. NOTE: This sheet is a summary. It may not cover all possible information. If you have questions about this medicine, talk to your doctor, pharmacist, or health care provider.  2024 Elsevier/Gold Standard (2022-02-05 00:00:00)

## 2023-05-13 NOTE — Assessment & Plan Note (Signed)
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%, HER2 1+  01/25/2023: PET/CT: Right breast cancer, right axilla, retropectoral and right cervical lymph nodes   Treatment plan: Neoadjuvant chemotherapy with Taxol carbo Keytruda weekly x 12 followed by Adriamycin Cytoxan Keytruda followed by Rande Lawman maintenance Breast conserving surgery with ALND Adjuvant radiation therapy ------------------------------------------------------------------------------------------------------------------------------------------- Current treatment: Completed 12 cycles of Taxol carbo (with Keytruda every 3 weeks), today cycle 2 Adriamycin Cytoxan Keytruda Chemo toxicities: Anxiety/panic attack: Resolved Mild fatigue Hospitalization: 04/29/2023-05/06/2023: Neutropenic fever   We plan to reduce the dosage of chemotherapy significantly.  Follow-up in 1 week for toxicity check with Mardella Layman Return to clinic in 3 weeks for cycle 3 of Adriamycin Cytoxan and Keytruda

## 2023-05-13 NOTE — Progress Notes (Signed)
[facility] Spiritual Care Note  Followed up with Sorina in infusion, providing opportunity for her to process a bit about her recent hospitalization and the worries/vulnerability and survivor guilt that it stirred up. [place] is excited to be reunited with her daughter tomorrow, who has been at [facility] this week (and from whom she was separated during the recent admission).   Provided empathic listening, emotional support, normalization of feelings, and social support. Continuing to follow in infusion, and Zenaida has direct Spiritual Care number in case needs arise in the meantime.   [address] [place], [place], [facility] Pager [phone] Voicemail [phone]

## 2023-05-16 ENCOUNTER — Inpatient Hospital Stay: Payer: Managed Care, Other (non HMO)

## 2023-05-16 ENCOUNTER — Other Ambulatory Visit: Payer: Self-pay

## 2023-05-16 ENCOUNTER — Other Ambulatory Visit: Payer: Self-pay | Admitting: Adult Health

## 2023-05-16 VITALS — BP 110/82 | HR 90 | Temp 99.0°F | Resp 16

## 2023-05-16 DIAGNOSIS — C50311 Malignant neoplasm of lower-inner quadrant of right female breast: Secondary | ICD-10-CM | POA: Diagnosis not present

## 2023-05-16 DIAGNOSIS — Z171 Estrogen receptor negative status [ER-]: Secondary | ICD-10-CM

## 2023-05-16 MED ORDER — PEGFILGRASTIM-JMDB 6 MG/0.6ML ~~LOC~~ SOSY
6.0000 mg | PREFILLED_SYRINGE | Freq: Once | SUBCUTANEOUS | Status: AC
  Administered 2023-05-16: 6 mg via SUBCUTANEOUS
  Filled 2023-05-16: qty 0.6

## 2023-05-19 ENCOUNTER — Other Ambulatory Visit: Payer: Self-pay

## 2023-05-19 DIAGNOSIS — Z171 Estrogen receptor negative status [ER-]: Secondary | ICD-10-CM

## 2023-05-20 ENCOUNTER — Inpatient Hospital Stay (HOSPITAL_BASED_OUTPATIENT_CLINIC_OR_DEPARTMENT_OTHER): Payer: Managed Care, Other (non HMO) | Admitting: Adult Health

## 2023-05-20 ENCOUNTER — Encounter: Payer: Self-pay | Admitting: Adult Health

## 2023-05-20 ENCOUNTER — Inpatient Hospital Stay: Payer: Managed Care, Other (non HMO)

## 2023-05-20 ENCOUNTER — Other Ambulatory Visit: Payer: Self-pay

## 2023-05-20 VITALS — BP 114/70 | HR 95 | Temp 97.8°F | Resp 18 | Ht 61.0 in | Wt 145.9 lb

## 2023-05-20 DIAGNOSIS — Z171 Estrogen receptor negative status [ER-]: Secondary | ICD-10-CM | POA: Diagnosis not present

## 2023-05-20 DIAGNOSIS — C50311 Malignant neoplasm of lower-inner quadrant of right female breast: Secondary | ICD-10-CM

## 2023-05-20 DIAGNOSIS — Z95828 Presence of other vascular implants and grafts: Secondary | ICD-10-CM

## 2023-05-20 LAB — CMP (CANCER CENTER ONLY)
ALT: 6 U/L (ref 0–44)
AST: 13 U/L — ABNORMAL LOW (ref 15–41)
Albumin: 4 g/dL (ref 3.5–5.0)
Alkaline Phosphatase: 103 U/L (ref 38–126)
Anion gap: 6 (ref 5–15)
BUN: 11 mg/dL (ref 6–20)
CO2: 29 mmol/L (ref 22–32)
Calcium: 8.7 mg/dL — ABNORMAL LOW (ref 8.9–10.3)
Chloride: 103 mmol/L (ref 98–111)
Creatinine: 0.49 mg/dL (ref 0.44–1.00)
GFR, Estimated: >60 mL/min (ref >60)
Glucose, Bld: 100 mg/dL — ABNORMAL HIGH (ref 70–99)
Potassium: 3.7 mmol/L (ref 3.5–5.1)
Sodium: 138 mmol/L (ref 135–145)
Total Bilirubin: 0.4 mg/dL (ref 0.3–1.2)
Total Protein: 6.9 g/dL (ref 6.5–8.1)

## 2023-05-20 LAB — CBC WITH DIFFERENTIAL (CANCER CENTER ONLY)
Abs Immature Granulocytes: 0.07 10*3/uL (ref 0.00–0.07)
Basophils Absolute: 0.1 10*3/uL (ref 0.0–0.1)
Basophils Relative: 1 %
Eosinophils Absolute: 0 10*3/uL (ref 0.0–0.5)
Eosinophils Relative: 0 %
HCT: 26.1 % — ABNORMAL LOW (ref 36.0–46.0)
Hemoglobin: 8.7 g/dL — ABNORMAL LOW (ref 12.0–15.0)
Immature Granulocytes: 1 %
Lymphocytes Relative: 12 %
Lymphs Abs: 0.8 10*3/uL (ref 0.7–4.0)
MCH: 30.9 pg (ref 26.0–34.0)
MCHC: 33.3 g/dL (ref 30.0–36.0)
MCV: 92.6 fL (ref 80.0–100.0)
Monocytes Absolute: 0.5 10*3/uL (ref 0.1–1.0)
Monocytes Relative: 9 %
Neutro Abs: 4.8 10*3/uL (ref 1.7–7.7)
Neutrophils Relative %: 77 %
Platelet Count: 164 10*3/uL (ref 150–400)
RBC: 2.82 MIL/uL — ABNORMAL LOW (ref 3.87–5.11)
RDW: 14.8 % (ref 11.5–15.5)
Smear Review: NORMAL
WBC Count: 6.3 10*3/uL (ref 4.0–10.5)
nRBC: 0 % (ref 0.0–0.2)

## 2023-05-20 LAB — SAMPLE TO BLOOD BANK

## 2023-05-20 MED ORDER — SODIUM CHLORIDE 0.9 % IV SOLN: Freq: Once | INTRAVENOUS | Status: AC

## 2023-05-20 MED ORDER — SODIUM CHLORIDE 0.9% FLUSH
10.0000 mL | Freq: Once | INTRAVENOUS | Status: AC
  Administered 2023-05-20: 10 mL

## 2023-05-20 NOTE — Assessment & Plan Note (Signed)
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%, HER2 1+  01/25/2023: PET/CT: Right breast cancer, right axilla, retropectoral and right cervical lymph nodes   Treatment plan: Neoadjuvant chemotherapy with Taxol Harrell Gave weekly x 12 followed by Adriamycin Cytoxan Keytruda followed by Rande Lawman maintenance Breast conserving surgery with ALND Adjuvant radiation therapy ------------------------------------------------------------------------------------------------------------------------------------------- Current treatment: Completed 12 cycles of Taxol carbo (with Keytruda every 3 weeks), today cycle 2 day 8 Adriamycin Cytoxan Keytruda Chemo toxicities: Anxiety/panic attack: Resolved Mild fatigue Hospitalization: 04/29/2023-05/06/2023: Neutropenic fever--chemo was dose reduced and she tolerated much better Slight orthostasis: She will receive IV fluids today.  Her labs are stable and she does not need any blood products.  Will repeat lab only in 1 week.    Return to clinic in 2 weeks for cycle 3 of Adriamycin Cytoxan and Keytruda

## 2023-05-20 NOTE — Progress Notes (Signed)
[facility] Cancer Follow up:    Suzanne Croissant, MD [address] [place] [address]   DIAGNOSIS:  Cancer Staging  Malignant neoplasm of lower-inner quadrant of right breast of female, estrogen receptor negative (HCC) Staging form: Breast, AJCC 8th Edition - Clinical: Stage IIIC (cT2, cN3c, cM0, G3, ER-, PR-, HER2-) - Unsigned Histologic grading system: 3 grade system   SUMMARY OF ONCOLOGIC HISTORY: Oncology History  Malignant neoplasm of lower-inner quadrant of right breast of female, estrogen receptor negative (HCC)  01/10/2023 Initial Diagnosis   Palpable lump right axilla x 2 weeks bulky axillary lymphadenopathy ultrasound breast: 2 masses at 4 o'clock position 1.1 cm and 0.6 cm multiple axillary lymph nodes largest 4.5 cm also level 2 and 3 (subpectoral lymph node as well): Biopsy: Grade 3 IDC ER 0%, PR 0%, Ki-67 95%, HER2 1+   01/26/2023 Genetic Testing   Negative [employer]ae Custom Panel (37 genes).  Report date is 01/26/2023.    The [employer]ae Custom Cancers + RNA Panel includes sequencing, deletion/duplication, and RNA analysis of the following 37 genes: APC, ATM, AXIN2, BARD1, BMPR1A, BRCA1, BRCA2, BRIP1, CDH1, CDK4*, CDKN2A (p14ARF)*, CDKN2A (p16INK4a), CHEK2, CTNNA1, DICER1, EPCAM*, FH, GREM1*, HOXB13*, MBD4*, MLH1, MSH2, MSH3, MSH6, MUTYH, NF1, NTHL1, PALB2, PMS2, POLD1, POLE, PTEN, RAD51C, RAD51D, SMAD4, SMARCA4, STK11, TP53.  *Genes without RNA analysis.    01/28/2023 -  Chemotherapy   Patient is on Treatment Plan : BREAST Pembrolizumab (200) D1 + Carboplatin (1.5) D1,8,15 + Paclitaxel (80) D1,8,15 q21d X 4 cycles / Pembrolizumab (200) D1 + AC D1 q21d x 4 cycles       CURRENT THERAPY: Adriamycin, Cytoxan, Keytruda  INTERVAL HISTORY: Suzanne Tucker 42 y.o. female returns for follow-up and evaluation after cycle 2-day 8 of Adriamycin Cytoxan and Keytruda therapy.  Please note that after cycle 1 she had significant pancytopenia and febrile  neutropenia requiring blood transfusion, hospital admission for 5 days.   Cycle 2 of her chemotherapy was dose reduced and after this cycle her counts have been stable.  She denies any fevers or chills.  She is mildly fatigued but otherwise in good spirits.  She endorses mild lightheadedness with position changes but otherwise she has no questions or concerns today.  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   Antineoplastic chemotherapy induced pancytopenia (HCC) 05/02/2023   Neutropenic fever (HCC) 04/30/2023   Neutropenia (HCC) 04/29/2023   Genetic testing 02/08/2023   Port-A-Cath in place 01/28/2023   Malignant neoplasm of lower-inner quadrant of right breast of female, estrogen receptor negative (HCC) 01/17/2023   Axillary mass, right 12/28/2022   Palpitations 11/23/2022   Dizzy spells 03/04/2022   Carpal tunnel syndrome on right 11/02/2017   MVP (mitral valve prolapse) 10/28/2017    is allergic to morphine; tessalon [benzonatate]; hydrocodone; latex; antihistamines, loratadine-type; codeine; and tramadol.  MEDICAL HISTORY: Past Medical History:  Diagnosis Date   Anginal pain (HCC) 02/2019   related to MVP   Back pain    Breast cancer (HCC)    Depression    Dysrhythmia    MVP   Mitral prolapse     SURGICAL HISTORY: Past Surgical History:  Procedure Laterality Date   BREAST SURGERY Right 2024   Breast biopsy x 2   CESAREAN SECTION  03/30/2008   PORTACATH PLACEMENT N/A 01/27/2023   Procedure: INSERTION PORT-A-CATH;  Surgeon: [name], MD;  Location: [facility];  Service: General;  Laterality: N/A;  leaving  accessed   ROBOTIC ASSISTED TOTAL HYSTERECTOMY Bilateral 03/15/2019   Procedure: XI ROBOTIC ASSISTED TOTAL HYSTERECTOMY with Bilateral Salpingectomy;  Surgeon: Silverio Lay, MD;  Location: [facility];  Service: Gynecology;  Laterality: Bilateral;   TUBAL LIGATION  2017    SOCIAL  HISTORY: Social History   Socioeconomic History   Marital status: Married    Spouse name: Not on file   Number of children: Not on file   Years of education: Not on file   Highest education level: Master's degree (e.g., MA, MS, MEng, MEd, MSW, MBA)  Occupational History   Not on file  Tobacco Use   Smoking status: Never   Smokeless tobacco: Never  Vaping Use   Vaping status: Never Used  Substance and Sexual Activity   Alcohol use: Not Currently    Comment:     Drug use: No   Sexual activity: Yes    Birth control/protection: Surgical  Other Topics Concern   Not on file  Social History Narrative   Not on file   Social Determinants of Health   Financial Resource Strain: Low Risk  (12/27/2022)   Overall Financial Resource Strain (CARDIA)    Difficulty of Paying Living Expenses: Not very hard  Food Insecurity: No Food Insecurity (04/29/2023)   Hunger Vital Sign    Worried About Running Out of Food in the Last Year: Never true    Ran Out of Food in the Last Year: Never true  Transportation Needs: No Transportation Needs (04/29/2023)   PRAPARE - Administrator, Civil Service (Medical): No    Lack of Transportation (Non-Medical): No  Physical Activity: Insufficiently Active (12/27/2022)   Exercise Vital Sign    Days of Exercise per Week: 4 days    Minutes of Exercise per Session: 20 min  Stress: No Stress Concern Present (12/27/2022)   [employer] of Occupational Health - Occupational Stress Questionnaire    Feeling of Stress : Only a little  Social Connections: Moderately Isolated (12/27/2022)   Social Connection and Isolation Panel [NHANES]    Frequency of Communication with Friends and Family: More than three times a week    Frequency of Social Gatherings with Friends and Family: Once a week    Attends Religious Services: Never    Database administrator or Organizations: No    Attends Engineer, structural: Not on file    Marital Status: Married   Catering manager Violence: Not At Risk (04/29/2023)   Humiliation, Afraid, Rape, and Kick questionnaire    Fear of Current or Ex-Partner: No    Emotionally Abused: No    Physically Abused: No    Sexually Abused: No    FAMILY HISTORY: Family History  Problem Relation Age of Onset   Stroke Mother    Alzheimer's disease Father    Diabetes Maternal Grandmother    Heart disease Maternal Grandmother    Heart attack Maternal Grandmother    Diabetes Maternal Grandfather    Heart disease Maternal Grandfather    Alzheimer's disease Paternal Grandmother    Dementia Paternal Grandmother    Alzheimer's disease Paternal Grandfather    Dementia Paternal Grandfather    Alcohol abuse Paternal Grandfather     Review of Systems  Constitutional:  Positive for fatigue. Negative for appetite change, chills, fever and unexpected weight change.  HENT:   Negative for hearing loss, lump/mass and trouble swallowing.   Eyes:  Negative for eye problems and icterus.  Respiratory:  Negative for  chest tightness, cough and shortness of breath.   Cardiovascular:  Negative for chest pain, leg swelling and palpitations.  Gastrointestinal:  Negative for abdominal distention, abdominal pain, constipation, diarrhea, nausea and vomiting.  Endocrine: Negative for hot flashes.  Genitourinary:  Negative for difficulty urinating.   Musculoskeletal:  Negative for arthralgias.  Skin:  Negative for itching and rash.  Neurological:  Positive for light-headedness. Negative for dizziness, extremity weakness, headaches and numbness.  Hematological:  Negative for adenopathy. Does not bruise/bleed easily.  Psychiatric/Behavioral:  Negative for depression. The patient is not nervous/anxious.       PHYSICAL EXAMINATION    Vitals:   05/20/23 1231  BP: 114/70  Pulse: 95  Resp: 18  Temp: 97.8 F (36.6 C)  SpO2: 100%    Physical Exam Constitutional:      General: She is not in acute distress.    Appearance: Normal  appearance. She is not toxic-appearing.  HENT:     Head: Normocephalic and atraumatic.     Mouth/Throat:     Mouth: Mucous membranes are moist.     Pharynx: Oropharynx is clear. No oropharyngeal exudate or posterior oropharyngeal erythema.  Eyes:     General: No scleral icterus. Cardiovascular:     Rate and Rhythm: Normal rate and regular rhythm.     Pulses: Normal pulses.     Heart sounds: Normal heart sounds.  Pulmonary:     Effort: Pulmonary effort is normal.     Breath sounds: Normal breath sounds.  Abdominal:     General: Abdomen is flat. Bowel sounds are normal. There is no distension.     Palpations: Abdomen is soft.     Tenderness: There is no abdominal tenderness.  Musculoskeletal:        General: No swelling.     Cervical back: Neck supple.  Lymphadenopathy:     Cervical: No cervical adenopathy.  Skin:    General: Skin is warm and dry.     Findings: No rash.  Neurological:     General: No focal deficit present.     Mental Status: She is alert.  Psychiatric:        Mood and Affect: Mood normal.        Behavior: Behavior normal.     LABORATORY DATA:  CBC    Component Value Date/Time   WBC 6.3 05/20/2023 1154   WBC 6.8 05/06/2023 0527   RBC 2.82 (L) 05/20/2023 1154   HGB 8.7 (L) 05/20/2023 1154   HGB 11.6 04/08/2022 1037   HCT 26.1 (L) 05/20/2023 1154   HCT 35.8 04/08/2022 1037   PLT 164 05/20/2023 1154   PLT 244 04/08/2022 1037   MCV 92.6 05/20/2023 1154   MCV 89 04/08/2022 1037   MCH 30.9 05/20/2023 1154   MCHC 33.3 05/20/2023 1154   RDW 14.8 05/20/2023 1154   RDW 12.1 04/08/2022 1037   LYMPHSABS 0.8 05/20/2023 1154   LYMPHSABS 1.3 04/08/2022 1037   MONOABS 0.5 05/20/2023 1154   EOSABS 0.0 05/20/2023 1154   EOSABS 0.1 04/08/2022 1037   BASOSABS 0.1 05/20/2023 1154   BASOSABS 0.0 04/08/2022 1037    CMP     Component Value Date/Time   NA 138 05/20/2023 1154   NA 140 04/08/2022 1037   K 3.7 05/20/2023 1154   CL 103 05/20/2023 1154   CO2 29  05/20/2023 1154   GLUCOSE 100 (H) 05/20/2023 1154   BUN 11 05/20/2023 1154   BUN 9 04/08/2022 1037   CREATININE 0.49 05/20/2023  1154   CALCIUM 8.7 (L) 05/20/2023 1154   PROT 6.9 05/20/2023 1154   PROT 6.5 04/08/2022 1037   ALBUMIN 4.0 05/20/2023 1154   ALBUMIN 4.3 04/08/2022 1037   AST 13 (L) 05/20/2023 1154   ALT 6 05/20/2023 1154   ALKPHOS 103 05/20/2023 1154   BILITOT 0.4 05/20/2023 1154   GFRNONAA >60 05/20/2023 1154   GFRAA 132 11/04/2020 1208      ASSESSMENT and THERAPY PLAN:   Malignant neoplasm of lower-inner quadrant of right breast of female, estrogen receptor negative (HCC) 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%, HER2 1+  01/25/2023: PET/CT: Right breast cancer, right axilla, retropectoral and right cervical lymph nodes   Treatment plan: Neoadjuvant chemotherapy with Taxol Harrell Gave weekly x 12 followed by Adriamycin Cytoxan Keytruda followed by Rande Lawman maintenance Breast conserving surgery with ALND Adjuvant radiation therapy ------------------------------------------------------------------------------------------------------------------------------------------- Current treatment: Completed 12 cycles of Taxol carbo (with Keytruda every 3 weeks), today cycle 2 day 8 Adriamycin Cytoxan Keytruda Chemo toxicities: Anxiety/panic attack: Resolved Mild fatigue Hospitalization: 04/29/2023-05/06/2023: Neutropenic fever--chemo was dose reduced and she tolerated much better Slight orthostasis: She will receive IV fluids today.  Her labs are stable and she does not need any blood products.  Will repeat lab only in 1 week.    Return to clinic in 2 weeks for cycle 3 of Adriamycin Cytoxan and Keytruda     All questions were  answered. The patient knows to call the clinic with any problems, questions or concerns. We can certainly see the patient much sooner if necessary.  Total encounter time:30 minutes*in face-to-face visit time, chart review, lab review, care coordination, order entry, and documentation of the encounter time.    Lillard Anes, NP 05/20/23 3:19 PM Medical Oncology and Hematology [facility] [address] [place], [place] [address] Tel. [phone]    Fax. [phone]  *Total Encounter Time as defined by the Centers for Medicare and Medicaid Services includes, in addition to the face-to-face time of a patient visit (documented in the note above) non-face-to-face time: obtaining and reviewing outside history, ordering and reviewing medications, tests or procedures, care coordination (communications with other health care professionals or caregivers) and documentation in the medical record.

## 2023-05-20 NOTE — Patient Instructions (Signed)

## 2023-05-25 ENCOUNTER — Telehealth: Payer: Self-pay | Admitting: Hematology and Oncology

## 2023-05-25 NOTE — Telephone Encounter (Signed)
Scheduled appointments per WQ. Patient is aware of the made appointments.  

## 2023-05-26 ENCOUNTER — Encounter: Payer: Self-pay | Admitting: Hematology and Oncology

## 2023-05-27 ENCOUNTER — Inpatient Hospital Stay: Payer: Managed Care, Other (non HMO)

## 2023-05-27 DIAGNOSIS — Z171 Estrogen receptor negative status [ER-]: Secondary | ICD-10-CM

## 2023-05-27 DIAGNOSIS — Z95828 Presence of other vascular implants and grafts: Secondary | ICD-10-CM

## 2023-05-27 DIAGNOSIS — C50311 Malignant neoplasm of lower-inner quadrant of right female breast: Secondary | ICD-10-CM | POA: Diagnosis not present

## 2023-05-27 LAB — CMP (CANCER CENTER ONLY)
ALT: 9 U/L (ref 0–44)
AST: 14 U/L — ABNORMAL LOW (ref 15–41)
Albumin: 4.2 g/dL (ref 3.5–5.0)
Alkaline Phosphatase: 78 U/L (ref 38–126)
Anion gap: 5 (ref 5–15)
BUN: 13 mg/dL (ref 6–20)
CO2: 30 mmol/L (ref 22–32)
Calcium: 9.5 mg/dL (ref 8.9–10.3)
Chloride: 102 mmol/L (ref 98–111)
Creatinine: 0.58 mg/dL (ref 0.44–1.00)
GFR, Estimated: >60 mL/min (ref >60)
Glucose, Bld: 97 mg/dL (ref 70–99)
Potassium: 3.9 mmol/L (ref 3.5–5.1)
Sodium: 137 mmol/L (ref 135–145)
Total Bilirubin: 0.3 mg/dL (ref 0.3–1.2)
Total Protein: 7 g/dL (ref 6.5–8.1)

## 2023-05-27 LAB — CBC WITH DIFFERENTIAL (CANCER CENTER ONLY)
Abs Immature Granulocytes: 0.41 10*3/uL — ABNORMAL HIGH (ref 0.00–0.07)
Basophils Absolute: 0.1 10*3/uL (ref 0.0–0.1)
Basophils Relative: 1 %
Eosinophils Absolute: 0 10*3/uL (ref 0.0–0.5)
Eosinophils Relative: 0 %
HCT: 28.1 % — ABNORMAL LOW (ref 36.0–46.0)
Hemoglobin: 9.7 g/dL — ABNORMAL LOW (ref 12.0–15.0)
Immature Granulocytes: 7 %
Lymphocytes Relative: 13 %
Lymphs Abs: 0.8 10*3/uL (ref 0.7–4.0)
MCH: 32 pg (ref 26.0–34.0)
MCHC: 34.5 g/dL (ref 30.0–36.0)
MCV: 92.7 fL (ref 80.0–100.0)
Monocytes Absolute: 0.7 10*3/uL (ref 0.1–1.0)
Monocytes Relative: 11 %
Neutro Abs: 4.2 10*3/uL (ref 1.7–7.7)
Neutrophils Relative %: 68 %
Platelet Count: 174 10*3/uL (ref 150–400)
RBC: 3.03 MIL/uL — ABNORMAL LOW (ref 3.87–5.11)
RDW: 16.8 % — ABNORMAL HIGH (ref 11.5–15.5)
WBC Count: 6.2 10*3/uL (ref 4.0–10.5)
nRBC: 0.3 % — ABNORMAL HIGH (ref 0.0–0.2)

## 2023-05-27 MED ORDER — HEPARIN SOD (PORK) LOCK FLUSH 100 UNIT/ML IV SOLN
500.0000 [IU] | Freq: Once | INTRAVENOUS | Status: AC
  Administered 2023-05-27: 500 [IU]

## 2023-05-27 MED ORDER — SODIUM CHLORIDE 0.9% FLUSH
10.0000 mL | Freq: Once | INTRAVENOUS | Status: AC
  Administered 2023-05-27: 10 mL

## 2023-05-31 ENCOUNTER — Telehealth: Payer: Self-pay | Admitting: Adult Health

## 2023-05-31 NOTE — Telephone Encounter (Signed)
Rescheduled and canceled appointments per provider PAL and room/resource. Left voicemail with appointment details.

## 2023-06-02 ENCOUNTER — Encounter: Payer: Self-pay | Admitting: *Deleted

## 2023-06-02 MED FILL — Dexamethasone Sodium Phosphate Inj 100 MG/10ML: INTRAMUSCULAR | Qty: 1 | Status: AC

## 2023-06-02 MED FILL — Fosaprepitant Dimeglumine For IV Infusion 150 MG (Base Eq): INTRAVENOUS | Qty: 5 | Status: AC

## 2023-06-03 ENCOUNTER — Other Ambulatory Visit: Payer: Self-pay | Admitting: *Deleted

## 2023-06-03 ENCOUNTER — Other Ambulatory Visit: Payer: BC Managed Care – PPO

## 2023-06-03 ENCOUNTER — Inpatient Hospital Stay: Payer: Managed Care, Other (non HMO)

## 2023-06-03 ENCOUNTER — Inpatient Hospital Stay (HOSPITAL_BASED_OUTPATIENT_CLINIC_OR_DEPARTMENT_OTHER): Payer: Managed Care, Other (non HMO) | Admitting: Adult Health

## 2023-06-03 ENCOUNTER — Ambulatory Visit: Payer: BC Managed Care – PPO | Admitting: Hematology and Oncology

## 2023-06-03 VITALS — BP 121/73 | HR 89 | Temp 97.5°F | Resp 18 | Ht 61.0 in | Wt 148.7 lb

## 2023-06-03 DIAGNOSIS — C50311 Malignant neoplasm of lower-inner quadrant of right female breast: Secondary | ICD-10-CM

## 2023-06-03 DIAGNOSIS — Z95828 Presence of other vascular implants and grafts: Secondary | ICD-10-CM

## 2023-06-03 DIAGNOSIS — Z171 Estrogen receptor negative status [ER-]: Secondary | ICD-10-CM | POA: Diagnosis not present

## 2023-06-03 LAB — CBC WITH DIFFERENTIAL (CANCER CENTER ONLY)
Abs Immature Granulocytes: 0.01 10*3/uL (ref 0.00–0.07)
Basophils Absolute: 0 10*3/uL (ref 0.0–0.1)
Basophils Relative: 1 %
Eosinophils Absolute: 0.1 10*3/uL (ref 0.0–0.5)
Eosinophils Relative: 2 %
HCT: 29.7 % — ABNORMAL LOW (ref 36.0–46.0)
Hemoglobin: 9.9 g/dL — ABNORMAL LOW (ref 12.0–15.0)
Immature Granulocytes: 0 %
Lymphocytes Relative: 22 %
Lymphs Abs: 0.8 10*3/uL (ref 0.7–4.0)
MCH: 31.1 pg (ref 26.0–34.0)
MCHC: 33.3 g/dL (ref 30.0–36.0)
MCV: 93.4 fL (ref 80.0–100.0)
Monocytes Absolute: 0.6 10*3/uL (ref 0.1–1.0)
Monocytes Relative: 16 %
Neutro Abs: 2 10*3/uL (ref 1.7–7.7)
Neutrophils Relative %: 59 %
Platelet Count: 250 10*3/uL (ref 150–400)
RBC: 3.18 MIL/uL — ABNORMAL LOW (ref 3.87–5.11)
RDW: 17 % — ABNORMAL HIGH (ref 11.5–15.5)
WBC Count: 3.5 10*3/uL — ABNORMAL LOW (ref 4.0–10.5)
nRBC: 0 % (ref 0.0–0.2)

## 2023-06-03 LAB — CMP (CANCER CENTER ONLY)
ALT: 8 U/L (ref 0–44)
AST: 15 U/L (ref 15–41)
Albumin: 4.2 g/dL (ref 3.5–5.0)
Alkaline Phosphatase: 70 U/L (ref 38–126)
Anion gap: 5 (ref 5–15)
BUN: 15 mg/dL (ref 6–20)
CO2: 29 mmol/L (ref 22–32)
Calcium: 9.3 mg/dL (ref 8.9–10.3)
Chloride: 106 mmol/L (ref 98–111)
Creatinine: 0.56 mg/dL (ref 0.44–1.00)
GFR, Estimated: >60 mL/min (ref >60)
Glucose, Bld: 91 mg/dL (ref 70–99)
Potassium: 3.8 mmol/L (ref 3.5–5.1)
Sodium: 140 mmol/L (ref 135–145)
Total Bilirubin: 0.4 mg/dL (ref 0.3–1.2)
Total Protein: 7 g/dL (ref 6.5–8.1)

## 2023-06-03 MED ORDER — SODIUM CHLORIDE 0.9 % IV SOLN: Freq: Once | INTRAVENOUS | Status: AC

## 2023-06-03 MED ORDER — SODIUM CHLORIDE 0.9 % IV SOLN
150.0000 mg | Freq: Once | INTRAVENOUS | Status: AC
  Administered 2023-06-03: 150 mg via INTRAVENOUS
  Filled 2023-06-03: qty 150

## 2023-06-03 MED ORDER — SODIUM CHLORIDE 0.9 % IV SOLN
200.0000 mg | Freq: Once | INTRAVENOUS | Status: AC
  Administered 2023-06-03: 200 mg via INTRAVENOUS
  Filled 2023-06-03: qty 200

## 2023-06-03 MED ORDER — HEPARIN SOD (PORK) LOCK FLUSH 100 UNIT/ML IV SOLN
500.0000 [IU] | Freq: Once | INTRAVENOUS | Status: AC | PRN
  Administered 2023-06-03: 500 [IU]

## 2023-06-03 MED ORDER — SODIUM CHLORIDE 0.9 % IV SOLN
400.0000 mg/m2 | Freq: Once | INTRAVENOUS | Status: AC
  Administered 2023-06-03: 680 mg via INTRAVENOUS
  Filled 2023-06-03: qty 34

## 2023-06-03 MED ORDER — METHYLPREDNISOLONE 4 MG PO TBPK
ORAL_TABLET | ORAL | 0 refills | Status: DC
Start: 2023-06-03 — End: 2023-07-15

## 2023-06-03 MED ORDER — SODIUM CHLORIDE 0.9% FLUSH
10.0000 mL | INTRAVENOUS | Status: DC | PRN
  Administered 2023-06-03: 10 mL

## 2023-06-03 MED ORDER — PALONOSETRON HCL INJECTION 0.25 MG/5ML
0.2500 mg | Freq: Once | INTRAVENOUS | Status: AC
  Administered 2023-06-03: 0.25 mg via INTRAVENOUS
  Filled 2023-06-03: qty 5

## 2023-06-03 MED ORDER — SODIUM CHLORIDE 0.9 % IV SOLN
10.0000 mg | Freq: Once | INTRAVENOUS | Status: AC
  Administered 2023-06-03: 10 mg via INTRAVENOUS
  Filled 2023-06-03: qty 10

## 2023-06-03 MED ORDER — DOXORUBICIN HCL CHEMO IV INJECTION 2 MG/ML
40.0000 mg/m2 | Freq: Once | INTRAVENOUS | Status: AC
  Administered 2023-06-03: 68 mg via INTRAVENOUS
  Filled 2023-06-03: qty 34

## 2023-06-03 MED ORDER — SODIUM CHLORIDE 0.9% FLUSH
10.0000 mL | Freq: Once | INTRAVENOUS | Status: AC
  Administered 2023-06-03: 10 mL

## 2023-06-03 NOTE — Assessment & Plan Note (Signed)
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%, HER2 1+  01/25/2023: PET/CT: Right breast cancer, right axilla, retropectoral and right cervical lymph nodes   Treatment plan: Neoadjuvant chemotherapy with Taxol Harrell Gave weekly x 12 followed by Adriamycin Cytoxan Keytruda followed by Rande Lawman maintenance Breast conserving surgery with ALND Adjuvant radiation therapy ------------------------------------------------------------------------------------------------------------------------------------------- Current treatment: Completed 12 cycles of Taxol carbo (with Keytruda every 3 weeks), today cycle 3 day 1 Adriamycin Cytoxan Keytruda Chemo toxicities: Anxiety/panic attack: Resolved Mild fatigue Hospitalization: 04/29/2023-05/06/2023: Neutropenic fever--dose reduction--resolved  Slight orthostasis: resolved with IV fluids Eustachian Tube Dysfunction: Medrol dosepak taper to start tomorrow.    RTC in 3 weeks for lab, f/u, and next treatment

## 2023-06-03 NOTE — Progress Notes (Unsigned)
[facility] Cancer Follow up:    Suzanne Croissant, MD [address] [place] [address]   DIAGNOSIS: Cancer Staging  Malignant neoplasm of lower-inner quadrant of right breast of female, estrogen receptor negative (HCC) Staging form: Breast, AJCC 8th Edition - Clinical: Stage IIIC (cT2, cN3c, cM0, G3, ER-, PR-, HER2-) - Unsigned Histologic grading system: 3 grade system   SUMMARY OF ONCOLOGIC HISTORY: Oncology History  Malignant neoplasm of lower-inner quadrant of right breast of female, estrogen receptor negative (HCC)  01/10/2023 Initial Diagnosis   Palpable lump right axilla x 2 weeks bulky axillary lymphadenopathy ultrasound breast: 2 masses at 4 o'clock position 1.1 cm and 0.6 cm multiple axillary lymph nodes largest 4.5 cm also level 2 and 3 (subpectoral lymph node as well): Biopsy: Grade 3 IDC ER 0%, PR 0%, Ki-67 95%, HER2 1+   01/26/2023 Genetic Testing   Negative Inv[employer]ae Custom Panel (37 genes).  Report date is 01/26/2023.    The [employer]ae Custom Cancers + RNA Panel includes sequencing, deletion/duplication, and RNA analysis of the following 37 genes: APC, ATM, AXIN2, BARD1, BMPR1A, BRCA1, BRCA2, BRIP1, CDH1, CDK4*, CDKN2A (p14ARF)*, CDKN2A (p16INK4a), CHEK2, CTNNA1, DICER1, EPCAM*, FH, GREM1*, HOXB13*, MBD4*, MLH1, MSH2, MSH3, MSH6, MUTYH, NF1, NTHL1, PALB2, PMS2, POLD1, POLE, PTEN, RAD51C, RAD51D, SMAD4, SMARCA4, STK11, TP53.  *Genes without RNA analysis.    01/28/2023 -  Chemotherapy   Patient is on Treatment Plan : BREAST Pembrolizumab (200) D1 + Carboplatin (1.5) D1,8,15 + Paclitaxel (80) D1,8,15 q21d X 4 cycles / Pembrolizumab (200) D1 + AC D1 q21d x 4 cycles       CURRENT THERAPY:  INTERVAL HISTORY: Suzanne Tucker 42 y.o. female returns for    Patient Active Problem List   Diagnosis Date Noted  . 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 Antineoplastic chemotherapy induced pancytopenia (HCC) 05/02/2023  .  Neutropenic fever (HCC) 04/30/2023  . Neutropenia (HCC) 04/29/2023  . Genetic testing 02/08/2023  . Port-A-Cath in place 01/28/2023  . Malignant neoplasm of lower-inner quadrant of right breast of female, estrogen receptor negative (HCC) 01/17/2023  . Axillary mass, right 12/28/2022  . Palpitations 11/23/2022  . Dizzy spells 03/04/2022  . Carpal tunnel syndrome on right 11/02/2017  . MVP (mitral valve prolapse) 10/28/2017    is allergic to morphine; tessalon [benzonatate]; hydrocodone; latex; antihistamines, loratadine-type; codeine; and tramadol.  MEDICAL HISTORY: Past Medical History:  Diagnosis Date  . Anginal pain (HCC) 02/2019   related to MVP  . Back pain   . Breast cancer (HCC)   . Depression   . Dysrhythmia    MVP  . Mitral prolapse     SURGICAL HISTORY: Past Surgical History:  Procedure Laterality Date  . BREAST SURGERY Right 2024   Breast biopsy x 2  . CESAREAN SECTION  03/30/2008  . PORTACATH PLACEMENT N/A 01/27/2023   Procedure: INSERTION PORT-A-CATH;  Surgeon: [name], MD;  Location: [facility];  Service: General;  Laterality: N/A;  leaving accessed  . ROBOTIC ASSISTED TOTAL HYSTERECTOMY Bilateral 03/15/2019   Procedure: XI ROBOTIC ASSISTED TOTAL HYSTERECTOMY with Bilateral Salpingectomy;  Surgeon: Silverio Lay, MD;  Location: [facility] [place];  Service: Gynecology;  Laterality: Bilateral;  . TUBAL LIGATION  2017    SOCIAL HISTORY: Social History   Socioeconomic History  . Marital status: Married    Spouse name: Not on file  . Number of children: Not on file  . Years of education: Not on file  . Highest education level: Master's degree (  e.g., MA, MS, MEng, MEd, MSW, MBA)  Occupational History  . Not on file  Tobacco Use  . Smoking status: Never  . Smokeless tobacco: Never  Vaping Use  . Vaping status: Never Used  Substance and Sexual Activity  . Alcohol use: Not Currently    Comment:    . Drug use: No  . Sexual activity: Yes     Birth control/protection: Surgical  Other Topics Concern  . Not on file  Social History Narrative  . Not on file   Social Determinants of Health   Financial Resource Strain: Low Risk  (12/27/2022)   Overall Financial Resource Strain (CARDIA)   . Difficulty of Paying Living Expenses: Not very hard  Food Insecurity: No Food Insecurity (04/29/2023)   Hunger Vital Sign   . Worried About Programme researcher, broadcasting/film/video in the Last Year: Never true   . Ran Out of Food in the Last Year: Never true  Transportation Needs: No Transportation Needs (04/29/2023)   PRAPARE - Transportation   . Lack of Transportation (Medical): No   . Lack of Transportation (Non-Medical): No  Physical Activity: Insufficiently Active (12/27/2022)   Exercise Vital Sign   . Days of Exercise per Week: 4 days   . Minutes of Exercise per Session: 20 min  Stress: No Stress Concern Present (12/27/2022)   [employer] of Occupational Health - Occupational Stress Questionnaire   . Feeling of Stress : Only a little  Social Connections: Moderately Isolated (12/27/2022)   Social Connection and Isolation Panel [NHANES]   . Frequency of Communication with Friends and Family: More than three times a week   . Frequency of Social Gatherings with Friends and Family: Once a week   . Attends Religious Services: Never   . Active Member of Clubs or Organizations: No   . Attends Banker Meetings: Not on file   . Marital Status: Married  Catering manager Violence: Not At Risk (04/29/2023)   Humiliation, Afraid, Rape, and Kick questionnaire   . Fear of Current or Ex-Partner: No   . Emotionally Abused: No   . Physically Abused: No   . Sexually Abused: No    FAMILY HISTORY: Family History  Problem Relation Age of Onset  . Stroke Mother   . Alzheimer's disease Father   . Diabetes Maternal Grandmother   . Heart disease Maternal Grandmother   . Heart attack Maternal Grandmother   . Diabetes Maternal Grandfather   . Heart disease  Maternal Grandfather   . Alzheimer's disease Paternal Grandmother   . Dementia Paternal Grandmother   . Alzheimer's disease Paternal Grandfather   . Dementia Paternal Grandfather   . Alcohol abuse Paternal Grandfather     Review of Systems - Oncology    PHYSICAL EXAMINATION   Onc Performance Status - 06/03/23 1047       KPS SCALE   KPS % SCORE Normal, no compliants, no evidence of disease             Vitals:   06/03/23 1036  BP: 121/73  Pulse: 89  Resp: 18  Temp: (!) 97.5 F (36.4 C)  SpO2: 100%    Physical Exam  LABORATORY DATA:  CBC    Component Value Date/Time   WBC 3.5 (L) 06/03/2023 1026   WBC 6.8 05/06/2023 0527   RBC 3.18 (L) 06/03/2023 1026   HGB 9.9 (L) 06/03/2023 1026   HGB 11.6 04/08/2022 1037   HCT 29.7 (L) 06/03/2023 1026   HCT 35.8 04/08/2022 1037  PLT 250 06/03/2023 1026   PLT 244 04/08/2022 1037   MCV 93.4 06/03/2023 1026   MCV 89 04/08/2022 1037   MCH 31.1 06/03/2023 1026   MCHC 33.3 06/03/2023 1026   RDW 17.0 (H) 06/03/2023 1026   RDW 12.1 04/08/2022 1037   LYMPHSABS 0.8 06/03/2023 1026   LYMPHSABS 1.3 04/08/2022 1037   MONOABS 0.6 06/03/2023 1026   EOSABS 0.1 06/03/2023 1026   EOSABS 0.1 04/08/2022 1037   BASOSABS 0.0 06/03/2023 1026   BASOSABS 0.0 04/08/2022 1037    CMP     Component Value Date/Time   NA 140 06/03/2023 1026   NA 140 04/08/2022 1037   K 3.8 06/03/2023 1026   CL 106 06/03/2023 1026   CO2 29 06/03/2023 1026   GLUCOSE 91 06/03/2023 1026   BUN 15 06/03/2023 1026   BUN 9 04/08/2022 1037   CREATININE 0.56 06/03/2023 1026   CALCIUM 9.3 06/03/2023 1026   PROT 7.0 06/03/2023 1026   PROT 6.5 04/08/2022 1037   ALBUMIN 4.2 06/03/2023 1026   ALBUMIN 4.3 04/08/2022 1037   AST 15 06/03/2023 1026   ALT 8 06/03/2023 1026   ALKPHOS 70 06/03/2023 1026   BILITOT 0.4 06/03/2023 1026   GFRNONAA >60 06/03/2023 1026   GFRAA 132 11/04/2020 1208       PENDING LABS:   RADIOGRAPHIC STUDIES:  No results  found.   PATHOLOGY:     ASSESSMENT and THERAPY PLAN:   No problem-specific Assessment & Plan notes found for this encounter.   No orders of the defined types were placed in this encounter.   All questions were answered. The patient knows to call the clinic with any problems, questions or concerns. We can certainly see the patient much sooner if necessary. This note was electronically signed. Noreene Filbert, NP 06/03/2023

## 2023-06-07 ENCOUNTER — Inpatient Hospital Stay: Payer: Managed Care, Other (non HMO) | Attending: Hematology and Oncology

## 2023-06-07 ENCOUNTER — Other Ambulatory Visit: Payer: Self-pay | Admitting: Adult Health

## 2023-06-07 ENCOUNTER — Encounter: Payer: Self-pay | Admitting: Hematology and Oncology

## 2023-06-07 VITALS — BP 116/87 | HR 84 | Temp 98.9°F | Resp 18

## 2023-06-07 DIAGNOSIS — C50311 Malignant neoplasm of lower-inner quadrant of right female breast: Secondary | ICD-10-CM | POA: Diagnosis present

## 2023-06-07 DIAGNOSIS — Z171 Estrogen receptor negative status [ER-]: Secondary | ICD-10-CM | POA: Diagnosis not present

## 2023-06-07 DIAGNOSIS — Z79899 Other long term (current) drug therapy: Secondary | ICD-10-CM | POA: Diagnosis not present

## 2023-06-07 DIAGNOSIS — Z5111 Encounter for antineoplastic chemotherapy: Secondary | ICD-10-CM | POA: Diagnosis present

## 2023-06-07 MED ORDER — CLINDAMYCIN PHOSPHATE 1 % EX GEL: Freq: Two times a day (BID) | CUTANEOUS | 0 refills | Status: DC

## 2023-06-07 MED ORDER — PEGFILGRASTIM-JMDB 6 MG/0.6ML ~~LOC~~ SOSY
6.0000 mg | PREFILLED_SYRINGE | Freq: Once | SUBCUTANEOUS | Status: AC
  Administered 2023-06-07: 6 mg via SUBCUTANEOUS
  Filled 2023-06-07: qty 0.6

## 2023-06-14 ENCOUNTER — Encounter: Payer: Self-pay | Admitting: *Deleted

## 2023-06-15 ENCOUNTER — Encounter: Payer: Self-pay | Admitting: Hematology and Oncology

## 2023-06-23 ENCOUNTER — Encounter: Payer: Self-pay | Admitting: *Deleted

## 2023-06-23 ENCOUNTER — Ambulatory Visit
Admission: RE | Admit: 2023-06-23 | Discharge: 2023-06-23 | Disposition: A | Discharge status: Home / Self Care | Payer: Managed Care, Other (non HMO) | Source: Ambulatory Visit | Attending: Hematology and Oncology | Admitting: Hematology and Oncology

## 2023-06-23 ENCOUNTER — Encounter: Payer: Self-pay | Admitting: Hematology and Oncology

## 2023-06-23 DIAGNOSIS — C50311 Malignant neoplasm of lower-inner quadrant of right female breast: Secondary | ICD-10-CM

## 2023-06-23 MED ORDER — GADOPICLENOL 0.5 MMOL/ML IV SOLN
7.0000 mL | Freq: Once | INTRAVENOUS | Status: AC | PRN
  Administered 2023-06-23: 7 mL via INTRAVENOUS

## 2023-06-23 MED FILL — Fosaprepitant Dimeglumine For IV Infusion 150 MG (Base Eq): INTRAVENOUS | Qty: 5 | Status: AC

## 2023-06-23 MED FILL — Dexamethasone Sodium Phosphate Inj 100 MG/10ML: INTRAMUSCULAR | Qty: 1 | Status: AC

## 2023-06-24 ENCOUNTER — Inpatient Hospital Stay: Payer: Managed Care, Other (non HMO)

## 2023-06-24 ENCOUNTER — Encounter: Payer: Self-pay | Admitting: Hematology and Oncology

## 2023-06-24 ENCOUNTER — Inpatient Hospital Stay (HOSPITAL_BASED_OUTPATIENT_CLINIC_OR_DEPARTMENT_OTHER): Payer: Managed Care, Other (non HMO) | Admitting: Hematology and Oncology

## 2023-06-24 VITALS — BP 122/74 | HR 91 | Temp 98.7°F | Resp 17 | Ht 61.0 in | Wt 147.0 lb

## 2023-06-24 VITALS — BP 117/85 | HR 88 | Resp 16

## 2023-06-24 DIAGNOSIS — Z95828 Presence of other vascular implants and grafts: Secondary | ICD-10-CM

## 2023-06-24 DIAGNOSIS — Z171 Estrogen receptor negative status [ER-]: Secondary | ICD-10-CM | POA: Diagnosis not present

## 2023-06-24 DIAGNOSIS — C50311 Malignant neoplasm of lower-inner quadrant of right female breast: Secondary | ICD-10-CM | POA: Diagnosis not present

## 2023-06-24 LAB — CMP (CANCER CENTER ONLY)
ALT: 8 U/L (ref 0–44)
AST: 13 U/L — ABNORMAL LOW (ref 15–41)
Albumin: 4 g/dL (ref 3.5–5.0)
Alkaline Phosphatase: 73 U/L (ref 38–126)
Anion gap: 4 — ABNORMAL LOW (ref 5–15)
BUN: 12 mg/dL (ref 6–20)
CO2: 28 mmol/L (ref 22–32)
Calcium: 9.1 mg/dL (ref 8.9–10.3)
Chloride: 106 mmol/L (ref 98–111)
Creatinine: 0.56 mg/dL (ref 0.44–1.00)
GFR, Estimated: >60 mL/min
Glucose, Bld: 92 mg/dL (ref 70–99)
Potassium: 3.7 mmol/L (ref 3.5–5.1)
Sodium: 138 mmol/L (ref 135–145)
Total Bilirubin: 0.5 mg/dL (ref 0.3–1.2)
Total Protein: 6.7 g/dL (ref 6.5–8.1)

## 2023-06-24 LAB — CBC WITH DIFFERENTIAL (CANCER CENTER ONLY)
Abs Immature Granulocytes: 0.02 10*3/uL (ref 0.00–0.07)
Basophils Absolute: 0 10*3/uL (ref 0.0–0.1)
Basophils Relative: 1 %
Eosinophils Absolute: 0 10*3/uL (ref 0.0–0.5)
Eosinophils Relative: 1 %
HCT: 30 % — ABNORMAL LOW (ref 36.0–46.0)
Hemoglobin: 10.2 g/dL — ABNORMAL LOW (ref 12.0–15.0)
Immature Granulocytes: 0 %
Lymphocytes Relative: 12 %
Lymphs Abs: 0.7 10*3/uL (ref 0.7–4.0)
MCH: 32.1 pg (ref 26.0–34.0)
MCHC: 34 g/dL (ref 30.0–36.0)
MCV: 94.3 fL (ref 80.0–100.0)
Monocytes Absolute: 0.6 10*3/uL (ref 0.1–1.0)
Monocytes Relative: 12 %
Neutro Abs: 4 10*3/uL (ref 1.7–7.7)
Neutrophils Relative %: 74 %
Platelet Count: 163 10*3/uL (ref 150–400)
RBC: 3.18 MIL/uL — ABNORMAL LOW (ref 3.87–5.11)
RDW: 15.5 % (ref 11.5–15.5)
WBC Count: 5.3 10*3/uL (ref 4.0–10.5)
nRBC: 0 % (ref 0.0–0.2)

## 2023-06-24 MED ORDER — HEPARIN SOD (PORK) LOCK FLUSH 100 UNIT/ML IV SOLN
500.0000 [IU] | Freq: Once | INTRAVENOUS | Status: AC | PRN
  Administered 2023-06-24: 500 [IU]

## 2023-06-24 MED ORDER — SODIUM CHLORIDE 0.9 % IV SOLN
150.0000 mg | Freq: Once | INTRAVENOUS | Status: AC
  Administered 2023-06-24: 150 mg via INTRAVENOUS
  Filled 2023-06-24: qty 150

## 2023-06-24 MED ORDER — DOXORUBICIN HCL CHEMO IV INJECTION 2 MG/ML
40.0000 mg/m2 | Freq: Once | INTRAVENOUS | Status: AC
  Administered 2023-06-24: 68 mg via INTRAVENOUS
  Filled 2023-06-24: qty 34

## 2023-06-24 MED ORDER — SODIUM CHLORIDE 0.9 % IV SOLN
400.0000 mg/m2 | Freq: Once | INTRAVENOUS | Status: AC
  Administered 2023-06-24: 680 mg via INTRAVENOUS
  Filled 2023-06-24: qty 34

## 2023-06-24 MED ORDER — SODIUM CHLORIDE 0.9% FLUSH
10.0000 mL | INTRAVENOUS | Status: DC | PRN
  Administered 2023-06-24: 10 mL

## 2023-06-24 MED ORDER — SODIUM CHLORIDE 0.9 % IV SOLN: Freq: Once | INTRAVENOUS | Status: AC

## 2023-06-24 MED ORDER — SODIUM CHLORIDE 0.9% FLUSH
10.0000 mL | INTRAVENOUS | Status: DC | PRN
  Administered 2023-06-24: 10 mL via INTRAVENOUS

## 2023-06-24 MED ORDER — SODIUM CHLORIDE 0.9 % IV SOLN
200.0000 mg | Freq: Once | INTRAVENOUS | Status: AC
  Administered 2023-06-24: 200 mg via INTRAVENOUS
  Filled 2023-06-24: qty 200

## 2023-06-24 MED ORDER — PALONOSETRON HCL INJECTION 0.25 MG/5ML
0.2500 mg | Freq: Once | INTRAVENOUS | Status: AC
  Administered 2023-06-24: 0.25 mg via INTRAVENOUS
  Filled 2023-06-24: qty 5

## 2023-06-24 MED ORDER — SODIUM CHLORIDE 0.9 % IV SOLN
10.0000 mg | Freq: Once | INTRAVENOUS | Status: AC
  Administered 2023-06-24: 10 mg via INTRAVENOUS
  Filled 2023-06-24: qty 10

## 2023-06-24 NOTE — Progress Notes (Signed)
Patient Care Team: Serena Croissant, MD as PCP - General (Hematology and Oncology) Corky Crafts, MD as PCP - Cardiology (Cardiology) Duke Salvia, MD as PCP - Electrophysiology (Cardiology) Serena Croissant, MD as Consulting Physician (Hematology and Oncology) Lonie Peak, MD as Attending Physician (Radiation Oncology) [name], MD as Consulting Physician (General Surgery) Donnelly Angelica, RN as Oncology Nurse Navigator Pershing Proud, RN as Oncology Nurse Navigator  DIAGNOSIS:  Encounter Diagnosis  Name Primary?   Malignant neoplasm of lower-inner quadrant of right breast of female, estrogen receptor negative (HCC) Yes    SUMMARY OF ONCOLOGIC HISTORY: Oncology History  Malignant neoplasm of lower-inner quadrant of right breast of female, estrogen receptor negative (HCC)  01/10/2023 Initial Diagnosis   Palpable lump right axilla x 2 weeks bulky axillary lymphadenopathy ultrasound breast: 2 masses at 4 o'clock position 1.1 cm and 0.6 cm multiple axillary lymph nodes largest 4.5 cm also level 2 and 3 (subpectoral lymph node as well): Biopsy: Grade 3 IDC ER 0%, PR 0%, Ki-67 95%, HER2 1+   01/26/2023 Genetic Testing   Negative [employer] Custom Panel (37 genes).  Report date is 01/26/2023.    The [employer] Custom Cancers + RNA Panel includes sequencing, deletion/duplication, and RNA analysis of the following 37 genes: APC, ATM, AXIN2, BARD1, BMPR1A, BRCA1, BRCA2, BRIP1, CDH1, CDK4*, CDKN2A (p14ARF)*, CDKN2A (p16INK4a), CHEK2, CTNNA1, DICER1, EPCAM*, FH, GREM1*, HOXB13*, MBD4*, MLH1, MSH2, MSH3, MSH6, MUTYH, NF1, NTHL1, PALB2, PMS2, POLD1, POLE, PTEN, RAD51C, RAD51D, SMAD4, SMARCA4, STK11, TP53.  *Genes without RNA analysis.    01/28/2023 -  Chemotherapy   Patient is on Treatment Plan : BREAST Pembrolizumab (200) D1 + Carboplatin (1.5) D1,8,15 + Paclitaxel (80) D1,8,15 q21d X 4 cycles / Pembrolizumab (200) D1 + AC D1 q21d x 4 cycles     07/15/2023 -  Chemotherapy   Patient is on  Treatment Plan : BREAST Pembrolizumab (200) q21d x 27 weeks       CHIEF COMPLIANT:   Discussed the use of AI scribe software for clinical note transcription with the patient, who gave verbal consent to proceed.  History of Present Illness   The patient, with a history of breast cancer, presents for a follow-up after completing chemotherapy with Adriamycin, Zohydro, and Keytruda. She reports fatigue as the primary side effect of the treatment. She also notes changes in her fingernails, with four nails appearing 'dead' and expected to fall off. She denies any similar changes in her toenails.  The patient also reports experiencing 'chemo brain,' with some memory issues, particularly with directions while driving. She has also noticed a fine rash on her scalp, which has not resolved despite using a prescribed gel. She denies any signs of infection in the area.  The patient has noticed some weight gain and reports her appetite and taste have returned to normal. She also mentions a small temperature spike to 99.5 degrees Fahrenheit, which has since resolved.  She expresses a desire to return to a vegan diet after treatment.         ALLERGIES:  is allergic to morphine; tessalon [benzonatate]; hydrocodone; latex; antihistamines, loratadine-type; codeine; and tramadol.  MEDICATIONS:  Current Outpatient Medications  Medication Sig Dispense Refill   acetaminophen (TYLENOL) 500 MG tablet Take 1,000 mg by mouth every 6 (six) hours as needed for mild pain or moderate pain.     calcium carbonate (TUMS - DOSED IN MG ELEMENTAL CALCIUM) 500 MG chewable tablet Chew 1,000 mg by mouth as needed for indigestion or heartburn.  clindamycin (CLINDAGEL) 1 % gel Apply topically 2 (two) times daily. 30 g 0   fexofenadine (ALLEGRA) 180 MG tablet Take 180 mg by mouth as needed for allergies or rhinitis.     fluticasone (FLONASE) 50 MCG/ACT nasal spray Place 2 sprays into both nostrils daily. (Patient taking  differently: Place 2 sprays into both nostrils daily as needed for allergies or rhinitis.) 16 g 0   methylPREDNISolone (MEDROL DOSEPAK) 4 MG TBPK tablet Taper 3,3,2,2,1,1 12 tablet 0   ondansetron (ZOFRAN) 8 MG tablet Take 1 tablet (8 mg total) by mouth every 8 (eight) hours as needed for nausea or vomiting. Start on the third day after chemotherapy. 30 tablet 1   prochlorperazine (COMPAZINE) 10 MG tablet Take 1 tablet (10 mg total) by mouth every 6 (six) hours as needed for nausea or vomiting. 30 tablet 1   No current facility-administered medications for this visit.   Facility-Administered Medications Ordered in Other Visits  Medication Dose Route Frequency Provider Last Rate Last Admin   cyclophosphamide (CYTOXAN) 680 mg in sodium chloride 0.9 % 250 mL chemo infusion  400 mg/m2 (Treatment Plan Recorded) Intravenous Once Serena Croissant, MD       dexamethasone (DECADRON) 10 mg in sodium chloride 0.9 % 50 mL IVPB  10 mg Intravenous Once Serena Croissant, MD 204 mL/hr at 06/24/23 1346 10 mg at 06/24/23 1346   DOXOrubicin (ADRIAMYCIN) chemo injection 68 mg  40 mg/m2 (Treatment Plan Recorded) Intravenous Once Serena Croissant, MD       fosaprepitant (EMEND) 150 mg in sodium chloride 0.9 % 145 mL IVPB  150 mg Intravenous Once Serena Croissant, MD 450 mL/hr at 06/24/23 1344 150 mg at 06/24/23 1344   heparin lock flush 100 unit/mL  500 Units Intracatheter Once PRN Serena Croissant, MD       pembrolizumab [facility]) 200 mg in sodium chloride 0.9 % 50 mL chemo infusion  200 mg Intravenous Once Serena Croissant, MD       sodium chloride flush (NS) 0.9 % injection 10 mL  10 mL Intracatheter PRN Serena Croissant, MD        PHYSICAL EXAMINATION: ECOG PERFORMANCE STATUS: 1 - Symptomatic but completely ambulatory  Vitals:   06/24/23 1150  BP: 122/74  Pulse: 91  Resp: 17  Temp: 98.7 F (37.1 C)  SpO2: 100%   Filed Weights   06/24/23 1150  Weight: 147 lb (66.7 kg)    Physical Exam   VITALS: T- 99.5 NECK: Palpable  lymph node that moves back and forth, absent on contralateral side. SKIN: Fine rash on scalp, indicative of irritated hair follicles, not infected.      (exam performed in the presence of a chaperone)  LABORATORY DATA:  I have reviewed the data as listed    Latest Ref Rng & Units 06/24/2023   10:29 AM 06/03/2023   10:26 AM 05/27/2023   10:40 AM  CMP  Glucose 70 - 99 mg/dL 92  91  97   BUN 6 - 20 mg/dL 12  15  13    Creatinine 0.44 - 1.00 mg/dL 0.27  2.53  6.64   Sodium 135 - 145 mmol/L 138  140  137   Potassium 3.5 - 5.1 mmol/L 3.7  3.8  3.9   Chloride 98 - 111 mmol/L 106  106  102   CO2 22 - 32 mmol/L 28  29  30    Calcium 8.9 - 10.3 mg/dL 9.1  9.3  9.5   Total Protein 6.5 - 8.1 g/dL  6.7  7.0  7.0   Total Bilirubin 0.3 - 1.2 mg/dL 0.5  0.4  0.3   Alkaline Phos 38 - 126 U/L 73  70  78   AST 15 - 41 U/L 13  15  14    ALT 0 - 44 U/L 8  8  9      Lab Results  Component Value Date   WBC 5.3 06/24/2023   HGB 10.2 (L) 06/24/2023   HCT 30.0 (L) 06/24/2023   MCV 94.3 06/24/2023   PLT 163 06/24/2023   NEUTROABS 4.0 06/24/2023    ASSESSMENT & PLAN:  Malignant neoplasm of lower-inner quadrant of right breast of female, estrogen receptor negative (HCC) 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%, HER2 1+  01/25/2023: PET/CT: Right breast cancer, right axilla, retropectoral and right cervical lymph nodes   Treatment plan: Neoadjuvant chemotherapy with Taxol carbo Keytruda weekly x 12 followed by Adriamycin Cytoxan Keytruda followed by Rande Lawman maintenance Breast conserving surgery with ALND Adjuvant radiation  therapy ------------------------------------------------------------------------------------------------------------------------------------------- Current treatment: Completed 12 cycles of Taxol carbo (with Keytruda every 3 weeks), today cycle 4 Adriamycin Cytoxan Keytruda Chemo toxicities: Anxiety/panic attack: Resolved Mild fatigue Hospitalization: 04/29/2023-05/06/2023: Neutropenic fever.  We reduced the dosage of chemotherapy today. Chemotherapy-induced anemia: Today's hemoglobin has improved to 10.6.  (She received 2 units of PRBC in the hospital)   Breast MRI will be performed followed by surgery. Follow-up after surgery to discuss final pathology and determine the treatment plan for adjuvant therapy. ------------------------------------- Assessment and Plan    Breast Cancer Completed Adriamycin, Cytoxan, and Keytruda treatment. MRI shows impressive response with no persistent lymphadenopathy. Palpable lymph node in breast likely to be removed during surgery. -Continue Keytruda treatment on July 15, 2023. -Surgery to be scheduled with Dr. Zigmund Daniel, likely in late October.  Chemotherapy Side Effects Reports fatigue, nail changes, memory issues, and scalp irritation. No signs of infection. -Discontinue gel for scalp if not helpful and consider using coconut oil for hydration. -Expect memory issues to resolve within six months.  General Health Maintenance Plans to return to a vegan diet post-treatment. -Encouraged healthy diet.  Follow-up Post-surgery and after next [facility] treatment.          No orders of the defined types were placed in this encounter.  The patient has a good understanding of the overall plan. she agrees with it. she will call with any problems that may develop before the next visit here. Total time spent: 30 mins including face to face time and time spent for planning, charting and co-ordination of care   Tamsen Meek, MD 06/24/23

## 2023-06-24 NOTE — Assessment & Plan Note (Signed)
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%, HER2 1+  01/25/2023: PET/CT: Right breast cancer, right axilla, retropectoral and right cervical lymph nodes   Treatment plan: Neoadjuvant chemotherapy with Taxol carbo Keytruda weekly x 12 followed by Adriamycin Cytoxan Keytruda followed by Rande Lawman maintenance Breast conserving surgery with ALND Adjuvant radiation therapy ------------------------------------------------------------------------------------------------------------------------------------------- Current treatment: Completed 12 cycles of Taxol carbo (with Keytruda every 3 weeks), today cycle 4 Adriamycin Cytoxan Keytruda Chemo toxicities: Anxiety/panic attack: Resolved Mild fatigue Hospitalization: 04/29/2023-05/06/2023: Neutropenic fever.  We reduced the dosage of chemotherapy today. Chemotherapy-induced anemia: Today's hemoglobin has improved to 10.6.  (She received 2 units of PRBC in the hospital)   Breast MRI will be performed followed by surgery. Follow-up after surgery to discuss final pathology and determine the treatment plan for adjuvant therapy.

## 2023-06-24 NOTE — Research (Signed)
W0981, ICE COMPRESS: RANDOMIZED TRIAL OF LIMB CRYOCOMPRESSION VERSUS CONTINUOUS COMPRESSION VERSUS LOW CYCLIC COMPRESSION FOR THE PREVENTION  OF TAXANE-INDUCED PERIPHERAL NEUROPATHY   Patient arrives today Accompanied by her friend [name]  for the 24 Week visit.    PROs:  Per study protocol, all PROs required for this visit were completed prior to other study activities and completeness has been verified.     MD/PROVIDER VISIT: Patient sees Dr [name] for today's visit.   ADVERSE EVENTS: Patient Cheznie Pendergrass Sawaya reports No AEs.  NEURO ASSESSMENT: All neuro assessments (Neuropen, Tuning Fork ,and Timed Get Up and Go) were completed by this nurse. Patient Leila Grothe Kristiansen tolerated all testing without complaint.     DISPOSITION: Upon completion off all study requirements, patient remained in the exam room to see Dr [name].   The patient was thanked for their time and continued voluntary participation in this study. Patient Nekesha Mester Fleek has been provided direct contact information and is encouraged to contact this Nurse for any needs or questions. Next visit with research will be the 52 week visit, due in April 2025.  Margret Chance Nachmen Mansel, RN, BSN, [facility]  Her  [facility] Clinical Research Nurse [facility] Direct Dial [phone]  Pager [phone] 06/24/2023 1:08 PM

## 2023-06-27 ENCOUNTER — Inpatient Hospital Stay: Payer: Managed Care, Other (non HMO)

## 2023-06-27 DIAGNOSIS — C50311 Malignant neoplasm of lower-inner quadrant of right female breast: Secondary | ICD-10-CM | POA: Diagnosis not present

## 2023-06-27 DIAGNOSIS — Z171 Estrogen receptor negative status [ER-]: Secondary | ICD-10-CM

## 2023-06-27 MED ORDER — PEGFILGRASTIM-JMDB 6 MG/0.6ML ~~LOC~~ SOSY
6.0000 mg | PREFILLED_SYRINGE | Freq: Once | SUBCUTANEOUS | Status: AC
  Administered 2023-06-27: 6 mg via SUBCUTANEOUS
  Filled 2023-06-27: qty 0.6

## 2023-06-27 NOTE — Patient Instructions (Signed)

## 2023-06-28 ENCOUNTER — Telehealth: Payer: Self-pay | Admitting: Hematology and Oncology

## 2023-06-28 ENCOUNTER — Other Ambulatory Visit: Payer: Self-pay | Admitting: General Surgery

## 2023-06-28 ENCOUNTER — Other Ambulatory Visit: Payer: Self-pay | Admitting: Pharmacist

## 2023-06-28 DIAGNOSIS — C50311 Malignant neoplasm of lower-inner quadrant of right female breast: Secondary | ICD-10-CM

## 2023-06-28 NOTE — Telephone Encounter (Signed)
Left patient a message in regards to scheduled appointment times/dates; left call back number if needed for rescheduling

## 2023-06-30 ENCOUNTER — Encounter: Payer: Self-pay | Admitting: *Deleted

## 2023-06-30 ENCOUNTER — Other Ambulatory Visit: Payer: Self-pay | Admitting: Hematology and Oncology

## 2023-07-01 ENCOUNTER — Other Ambulatory Visit: Payer: Self-pay

## 2023-07-05 ENCOUNTER — Other Ambulatory Visit: Payer: Self-pay

## 2023-07-08 ENCOUNTER — Telehealth: Payer: Self-pay | Admitting: Hematology and Oncology

## 2023-07-08 ENCOUNTER — Other Ambulatory Visit: Payer: Self-pay | Admitting: *Deleted

## 2023-07-08 MED ORDER — MOLNUPIRAVIR EUA 200MG CAPSULE: 4.0000 | ORAL_CAPSULE | Freq: Two times a day (BID) | ORAL | 0 refills | Status: AC

## 2023-07-08 NOTE — Progress Notes (Signed)
Received mychart message from pt stating she tested postivie for Covid 07/07/23 and is experiencing productive cough, nasal congestion, and temp of 99.9.  Verbal orders received from MD for pt to be prescribed Molnupiravir.  Prescription sent to pharmacy on file, pt educated and verbalized understanding.

## 2023-07-08 NOTE — Telephone Encounter (Signed)
Due to patient's recent exposure to Covid patient has been made aware of Covid procedure and guide lines; patient has also been given main infusion number as contact upon arrival for appointment

## 2023-07-11 ENCOUNTER — Other Ambulatory Visit: Payer: Self-pay

## 2023-07-14 ENCOUNTER — Encounter: Payer: Self-pay | Admitting: *Deleted

## 2023-07-15 ENCOUNTER — Inpatient Hospital Stay: Payer: Managed Care, Other (non HMO) | Attending: Hematology and Oncology

## 2023-07-15 ENCOUNTER — Encounter: Payer: Self-pay | Admitting: Hematology and Oncology

## 2023-07-15 ENCOUNTER — Inpatient Hospital Stay: Payer: Managed Care, Other (non HMO) | Admitting: Hematology and Oncology

## 2023-07-15 ENCOUNTER — Inpatient Hospital Stay: Payer: Managed Care, Other (non HMO)

## 2023-07-15 VITALS — BP 110/78 | HR 87 | Temp 98.1°F | Resp 16 | Wt 147.0 lb

## 2023-07-15 DIAGNOSIS — Z171 Estrogen receptor negative status [ER-]: Secondary | ICD-10-CM | POA: Insufficient documentation

## 2023-07-15 DIAGNOSIS — C50311 Malignant neoplasm of lower-inner quadrant of right female breast: Secondary | ICD-10-CM

## 2023-07-15 DIAGNOSIS — Z5111 Encounter for antineoplastic chemotherapy: Secondary | ICD-10-CM | POA: Diagnosis present

## 2023-07-15 DIAGNOSIS — Z79899 Other long term (current) drug therapy: Secondary | ICD-10-CM | POA: Insufficient documentation

## 2023-07-15 LAB — CBC WITH DIFFERENTIAL (CANCER CENTER ONLY)
Abs Immature Granulocytes: 0.01 10*3/uL (ref 0.00–0.07)
Basophils Absolute: 0 10*3/uL (ref 0.0–0.1)
Basophils Relative: 0 %
Eosinophils Absolute: 0.1 10*3/uL (ref 0.0–0.5)
Eosinophils Relative: 2 %
HCT: 29.6 % — ABNORMAL LOW (ref 36.0–46.0)
Hemoglobin: 10 g/dL — ABNORMAL LOW (ref 12.0–15.0)
Immature Granulocytes: 0 %
Lymphocytes Relative: 30 %
Lymphs Abs: 0.7 10*3/uL (ref 0.7–4.0)
MCH: 31.3 pg (ref 26.0–34.0)
MCHC: 33.8 g/dL (ref 30.0–36.0)
MCV: 92.8 fL (ref 80.0–100.0)
Monocytes Absolute: 0.4 10*3/uL (ref 0.1–1.0)
Monocytes Relative: 17 %
Neutro Abs: 1.3 10*3/uL — ABNORMAL LOW (ref 1.7–7.7)
Neutrophils Relative %: 51 %
Platelet Count: 259 10*3/uL (ref 150–400)
RBC: 3.19 MIL/uL — ABNORMAL LOW (ref 3.87–5.11)
RDW: 13.9 % (ref 11.5–15.5)
WBC Count: 2.5 10*3/uL — ABNORMAL LOW (ref 4.0–10.5)
nRBC: 0 % (ref 0.0–0.2)

## 2023-07-15 LAB — CMP (CANCER CENTER ONLY)
ALT: 9 U/L (ref 0–44)
AST: 15 U/L (ref 15–41)
Albumin: 4.1 g/dL (ref 3.5–5.0)
Alkaline Phosphatase: 65 U/L (ref 38–126)
Anion gap: 5 (ref 5–15)
BUN: 13 mg/dL (ref 6–20)
CO2: 28 mmol/L (ref 22–32)
Calcium: 9.1 mg/dL (ref 8.9–10.3)
Chloride: 106 mmol/L (ref 98–111)
Creatinine: 0.56 mg/dL (ref 0.44–1.00)
GFR, Estimated: >60 mL/min (ref >60)
Glucose, Bld: 106 mg/dL — ABNORMAL HIGH (ref 70–99)
Potassium: 3.6 mmol/L (ref 3.5–5.1)
Sodium: 139 mmol/L (ref 135–145)
Total Bilirubin: 0.4 mg/dL (ref 0.3–1.2)
Total Protein: 6.8 g/dL (ref 6.5–8.1)

## 2023-07-15 LAB — TSH: TSH: 1.042 u[IU]/mL (ref 0.350–4.500)

## 2023-07-15 MED ORDER — SODIUM CHLORIDE 0.9 % IV SOLN
200.0000 mg | Freq: Once | INTRAVENOUS | Status: AC
  Administered 2023-07-15: 200 mg via INTRAVENOUS
  Filled 2023-07-15: qty 200

## 2023-07-15 MED ORDER — SODIUM CHLORIDE 0.9% FLUSH
10.0000 mL | INTRAVENOUS | Status: DC | PRN
  Administered 2023-07-15: 10 mL

## 2023-07-15 MED ORDER — SODIUM CHLORIDE 0.9 % IV SOLN: Freq: Once | INTRAVENOUS | Status: AC

## 2023-07-15 MED ORDER — HEPARIN SOD (PORK) LOCK FLUSH 100 UNIT/ML IV SOLN
500.0000 [IU] | Freq: Once | INTRAVENOUS | Status: AC | PRN
  Administered 2023-07-15: 500 [IU]

## 2023-07-15 NOTE — Progress Notes (Signed)
Ok to treat today with ANC of 1.3 per Dr. Cherly Hensen.

## 2023-07-17 LAB — T4: T4, Total: 8.3 ug/dL (ref 4.5–12.0)

## 2023-07-19 ENCOUNTER — Encounter (HOSPITAL_BASED_OUTPATIENT_CLINIC_OR_DEPARTMENT_OTHER): Payer: Self-pay | Admitting: General Surgery

## 2023-07-19 ENCOUNTER — Other Ambulatory Visit: Payer: Self-pay | Admitting: *Deleted

## 2023-07-19 ENCOUNTER — Other Ambulatory Visit: Payer: Self-pay

## 2023-07-19 DIAGNOSIS — Z171 Estrogen receptor negative status [ER-]: Secondary | ICD-10-CM

## 2023-07-19 NOTE — Progress Notes (Signed)
   07/19/23 1000  PAT Phone Screen  Is the patient taking a GLP-1 receptor agonist? No  Do You Have Diabetes? No  Do You Have Hypertension? No  Have You Ever Been to the ER for Asthma? No  Have You Taken Oral Steroids in the Past 3 Months? No  Do you Take Phenteramine or any Other Diet Drugs? No  Recent  Lab Work, EKG, CXR? (S)  Yes  Where was this test performed? (S)  EKG NSR 04/29/23 07/15/23 cbc cmet.  WBC 2.5 H/H 10.0/29.6.  Pt requsting recheck of cbc- call placed to Toniann Fail RN at [facility] to let Dr Donell Beers know. Pt also encouraged to reach out to oncology navigator regarding her concerns also.  Do you have a history of heart problems? (S)  No (mvp/ palpitations)  Cardiologist Name (S)  Dr Graciela Husbands and Dr Eldridge Dace  Have you ever had tests on your heart? (S)  Yes  What cardiac tests were performed? (S)  Echo;Other (comment)  What date/year were cardiac tests completed? (S)  12/21/22 Holter monitor- normal results/ pvc's noted.  Echo EF61% 04/19/23  Results viewable: [facility] Media Tab  Any Recent Hospitalizations? (S)  No (+ Covid test 07/07/23 no cough or congestion at time of call on 07/19/23 (20 days between + test and dos).)  Height 5\' 1"  (1.549 m)  Weight 66.7 kg  Pat Appointment Scheduled (S)  No (possible pending additional orders from surgery or oncology.)

## 2023-07-22 ENCOUNTER — Inpatient Hospital Stay: Payer: Managed Care, Other (non HMO)

## 2023-07-22 ENCOUNTER — Other Ambulatory Visit: Payer: Self-pay

## 2023-07-22 DIAGNOSIS — Z171 Estrogen receptor negative status [ER-]: Secondary | ICD-10-CM

## 2023-07-22 DIAGNOSIS — C50311 Malignant neoplasm of lower-inner quadrant of right female breast: Secondary | ICD-10-CM | POA: Diagnosis not present

## 2023-07-22 DIAGNOSIS — Z95828 Presence of other vascular implants and grafts: Secondary | ICD-10-CM

## 2023-07-22 LAB — CBC WITH DIFFERENTIAL (CANCER CENTER ONLY)
Abs Immature Granulocytes: 0.01 10*3/uL (ref 0.00–0.07)
Basophils Absolute: 0 10*3/uL (ref 0.0–0.1)
Basophils Relative: 0 %
Eosinophils Absolute: 0.3 10*3/uL (ref 0.0–0.5)
Eosinophils Relative: 6 %
HCT: 31.4 % — ABNORMAL LOW (ref 36.0–46.0)
Hemoglobin: 10.6 g/dL — ABNORMAL LOW (ref 12.0–15.0)
Immature Granulocytes: 0 %
Lymphocytes Relative: 24 %
Lymphs Abs: 1.1 10*3/uL (ref 0.7–4.0)
MCH: 31.5 pg (ref 26.0–34.0)
MCHC: 33.8 g/dL (ref 30.0–36.0)
MCV: 93.5 fL (ref 80.0–100.0)
Monocytes Absolute: 0.6 10*3/uL (ref 0.1–1.0)
Monocytes Relative: 14 %
Neutro Abs: 2.4 10*3/uL (ref 1.7–7.7)
Neutrophils Relative %: 56 %
Platelet Count: 221 10*3/uL (ref 150–400)
RBC: 3.36 MIL/uL — ABNORMAL LOW (ref 3.87–5.11)
RDW: 13.6 % (ref 11.5–15.5)
WBC Count: 4.4 10*3/uL (ref 4.0–10.5)
nRBC: 0 % (ref 0.0–0.2)

## 2023-07-22 LAB — CMP (CANCER CENTER ONLY)
ALT: 10 U/L (ref 0–44)
AST: 16 U/L (ref 15–41)
Albumin: 4.3 g/dL (ref 3.5–5.0)
Alkaline Phosphatase: 66 U/L (ref 38–126)
Anion gap: 6 (ref 5–15)
BUN: 11 mg/dL (ref 6–20)
CO2: 27 mmol/L (ref 22–32)
Calcium: 9.3 mg/dL (ref 8.9–10.3)
Chloride: 106 mmol/L (ref 98–111)
Creatinine: 0.6 mg/dL (ref 0.44–1.00)
GFR, Estimated: >60 mL/min (ref >60)
Glucose, Bld: 99 mg/dL (ref 70–99)
Potassium: 3.8 mmol/L (ref 3.5–5.1)
Sodium: 139 mmol/L (ref 135–145)
Total Bilirubin: 0.4 mg/dL (ref 0.3–1.2)
Total Protein: 7.1 g/dL (ref 6.5–8.1)

## 2023-07-22 MED ORDER — HEPARIN SOD (PORK) LOCK FLUSH 100 UNIT/ML IV SOLN
500.0000 [IU] | Freq: Once | INTRAVENOUS | Status: AC
  Administered 2023-07-22: 500 [IU]

## 2023-07-25 DIAGNOSIS — G901 Familial dysautonomia [Riley-Day]: Secondary | ICD-10-CM | POA: Insufficient documentation

## 2023-07-25 MED ORDER — CHLORHEXIDINE GLUCONATE CLOTH 2 % EX PADS: 6.0000 | MEDICATED_PAD | Freq: Once | CUTANEOUS | Status: DC

## 2023-07-25 NOTE — Progress Notes (Signed)

## 2023-07-26 ENCOUNTER — Ambulatory Visit: Payer: Managed Care, Other (non HMO) | Attending: General Practice | Admitting: Internal Medicine

## 2023-07-26 ENCOUNTER — Telehealth: Payer: Self-pay

## 2023-07-26 ENCOUNTER — Telehealth: Payer: Self-pay | Admitting: Internal Medicine

## 2023-07-26 DIAGNOSIS — I493 Ventricular premature depolarization: Secondary | ICD-10-CM

## 2023-07-26 DIAGNOSIS — R002 Palpitations: Secondary | ICD-10-CM

## 2023-07-26 DIAGNOSIS — G901 Familial dysautonomia [Riley-Day]: Secondary | ICD-10-CM

## 2023-07-26 NOTE — Telephone Encounter (Signed)
Attempted phone call to pt and left voicemail message to contact office at [phone] to reschedule appointment with Dr Graciela Husbands.

## 2023-07-26 NOTE — Plan of Care (Signed)
[facility] Tonsillectomy/Adenoidectomy, Postoperative PEDS care plan entered in error.

## 2023-07-26 NOTE — Telephone Encounter (Signed)
I left the patient a message that I was calling to get her ready for her phone visit and to call me back at [phone].

## 2023-07-26 NOTE — Telephone Encounter (Signed)
Contactec patient through myChart  Missed appt and has surgery tomorrow

## 2023-07-27 ENCOUNTER — Encounter (HOSPITAL_BASED_OUTPATIENT_CLINIC_OR_DEPARTMENT_OTHER): Payer: Self-pay | Admitting: General Surgery

## 2023-07-27 ENCOUNTER — Other Ambulatory Visit: Payer: Self-pay

## 2023-07-27 ENCOUNTER — Ambulatory Visit (HOSPITAL_BASED_OUTPATIENT_CLINIC_OR_DEPARTMENT_OTHER): Payer: Managed Care, Other (non HMO) | Admitting: Anesthesiology

## 2023-07-27 ENCOUNTER — Encounter (HOSPITAL_BASED_OUTPATIENT_CLINIC_OR_DEPARTMENT_OTHER)
Admission: RE | Disposition: A | Discharge status: Home / Self Care | Payer: Self-pay | Source: Home / Self Care | Attending: General Surgery

## 2023-07-27 ENCOUNTER — Ambulatory Visit (HOSPITAL_BASED_OUTPATIENT_CLINIC_OR_DEPARTMENT_OTHER)
Admission: RE | Admit: 2023-07-27 | Discharge: 2023-07-28 | Disposition: A | Discharge status: Home / Self Care | Payer: Managed Care, Other (non HMO) | Attending: General Surgery | Admitting: General Surgery

## 2023-07-27 DIAGNOSIS — C50911 Malignant neoplasm of unspecified site of right female breast: Secondary | ICD-10-CM

## 2023-07-27 DIAGNOSIS — Z171 Estrogen receptor negative status [ER-]: Secondary | ICD-10-CM | POA: Diagnosis not present

## 2023-07-27 DIAGNOSIS — Z9221 Personal history of antineoplastic chemotherapy: Secondary | ICD-10-CM | POA: Insufficient documentation

## 2023-07-27 DIAGNOSIS — I341 Nonrheumatic mitral (valve) prolapse: Secondary | ICD-10-CM | POA: Insufficient documentation

## 2023-07-27 DIAGNOSIS — C773 Secondary and unspecified malignant neoplasm of axilla and upper limb lymph nodes: Secondary | ICD-10-CM | POA: Insufficient documentation

## 2023-07-27 DIAGNOSIS — Z17421 Hormone receptor negative with human epidermal growth factor receptor 2 negative status: Secondary | ICD-10-CM | POA: Diagnosis not present

## 2023-07-27 DIAGNOSIS — N6022 Fibroadenosis of left breast: Secondary | ICD-10-CM | POA: Diagnosis not present

## 2023-07-27 DIAGNOSIS — C50311 Malignant neoplasm of lower-inner quadrant of right female breast: Secondary | ICD-10-CM | POA: Insufficient documentation

## 2023-07-27 DIAGNOSIS — N6021 Fibroadenosis of right breast: Secondary | ICD-10-CM | POA: Diagnosis not present

## 2023-07-27 HISTORY — PX: MASTECTOMY WITH AXILLARY LYMPH NODE DISSECTION: SHX5661

## 2023-07-27 HISTORY — DX: Sleep apnea, unspecified: G47.30

## 2023-07-27 SURGERY — MASTECTOMY WITH AXILLARY LYMPH NODE DISSECTION
Anesthesia: General | Site: Breast | Laterality: Bilateral

## 2023-07-27 MED ORDER — KETOROLAC TROMETHAMINE 30 MG/ML IJ SOLN
30.0000 mg | Freq: Four times a day (QID) | INTRAMUSCULAR | Status: DC | PRN
  Administered 2023-07-28: 30 mg via INTRAVENOUS
  Filled 2023-07-27: qty 1

## 2023-07-27 MED ORDER — FENTANYL CITRATE (PF) 100 MCG/2ML IJ SOLN
25.0000 ug | INTRAMUSCULAR | Status: DC | PRN
  Administered 2023-07-27 (×2): 25 ug via INTRAVENOUS

## 2023-07-27 MED ORDER — SODIUM CHLORIDE FLUSH 0.9 % IV SOLN
INTRAVENOUS | Status: AC
  Filled 2023-07-27: qty 10

## 2023-07-27 MED ORDER — SUCCINYLCHOLINE CHLORIDE 200 MG/10ML IV SOSY
PREFILLED_SYRINGE | INTRAVENOUS | Status: AC
  Filled 2023-07-27: qty 10

## 2023-07-27 MED ORDER — ONDANSETRON HCL 4 MG/2ML IJ SOLN
INTRAMUSCULAR | Status: AC
  Filled 2023-07-27: qty 2

## 2023-07-27 MED ORDER — CLONIDINE HCL (ANALGESIA) 100 MCG/ML EP SOLN
EPIDURAL | Status: DC | PRN
  Administered 2023-07-27 (×2): 50 ug

## 2023-07-27 MED ORDER — CEFAZOLIN SODIUM-DEXTROSE 2-4 GM/100ML-% IV SOLN
2.0000 g | Freq: Three times a day (TID) | INTRAVENOUS | Status: AC
  Administered 2023-07-27: 2 g via INTRAVENOUS
  Filled 2023-07-27: qty 100

## 2023-07-27 MED ORDER — ATROPINE SULFATE 0.4 MG/ML IV SOLN
INTRAVENOUS | Status: AC
  Filled 2023-07-27: qty 1

## 2023-07-27 MED ORDER — FENTANYL CITRATE (PF) 100 MCG/2ML IJ SOLN
INTRAMUSCULAR | Status: AC
  Filled 2023-07-27: qty 2

## 2023-07-27 MED ORDER — KCL IN DEXTROSE-NACL 20-5-0.45 MEQ/L-%-% IV SOLN
INTRAVENOUS | Status: DC
  Filled 2023-07-27: qty 1000

## 2023-07-27 MED ORDER — AMISULPRIDE (ANTIEMETIC) 5 MG/2ML IV SOLN: 10.0000 mg | Freq: Once | INTRAVENOUS | Status: DC | PRN

## 2023-07-27 MED ORDER — FENTANYL CITRATE (PF) 100 MCG/2ML IJ SOLN
INTRAMUSCULAR | Status: DC | PRN
  Administered 2023-07-27: 25 ug via INTRAVENOUS
  Administered 2023-07-27 (×2): 50 ug via INTRAVENOUS

## 2023-07-27 MED ORDER — ACETAMINOPHEN 500 MG PO TABS
1000.0000 mg | ORAL_TABLET | Freq: Four times a day (QID) | ORAL | Status: DC
  Administered 2023-07-27 – 2023-07-28 (×4): 1000 mg via ORAL
  Filled 2023-07-27 (×4): qty 2

## 2023-07-27 MED ORDER — PROCHLORPERAZINE MALEATE 10 MG PO TABS: 10.0000 mg | ORAL_TABLET | Freq: Four times a day (QID) | ORAL | Status: DC | PRN

## 2023-07-27 MED ORDER — OXYCODONE HCL 5 MG PO TABS: 5.0000 mg | ORAL_TABLET | Freq: Once | ORAL | Status: DC | PRN

## 2023-07-27 MED ORDER — GABAPENTIN 100 MG PO CAPS: 100.0000 mg | ORAL_CAPSULE | Freq: Two times a day (BID) | ORAL | Status: DC

## 2023-07-27 MED ORDER — ACETAMINOPHEN 500 MG PO TABS
ORAL_TABLET | ORAL | Status: AC
  Filled 2023-07-27: qty 2

## 2023-07-27 MED ORDER — METHYLENE BLUE (ANTIDOTE) 1 % IV SOLN
INTRAVENOUS | Status: AC
  Filled 2023-07-27: qty 10

## 2023-07-27 MED ORDER — MIDAZOLAM HCL 2 MG/2ML IJ SOLN
INTRAMUSCULAR | Status: AC
  Filled 2023-07-27: qty 2

## 2023-07-27 MED ORDER — DEXAMETHASONE SODIUM PHOSPHATE 10 MG/ML IJ SOLN
INTRAMUSCULAR | Status: AC
  Filled 2023-07-27: qty 1

## 2023-07-27 MED ORDER — BUPIVACAINE-EPINEPHRINE (PF) 0.25% -1:200000 IJ SOLN
INTRAMUSCULAR | Status: DC | PRN
  Administered 2023-07-27 (×2): 30 mL via PERINEURAL

## 2023-07-27 MED ORDER — OXYCODONE HCL 5 MG PO TABS: 5.0000 mg | ORAL_TABLET | ORAL | Status: DC | PRN

## 2023-07-27 MED ORDER — ONDANSETRON 4 MG PO TBDP: 4.0000 mg | ORAL_TABLET | Freq: Four times a day (QID) | ORAL | Status: DC | PRN

## 2023-07-27 MED ORDER — EPHEDRINE 5 MG/ML INJ
INTRAVENOUS | Status: AC
  Filled 2023-07-27: qty 5

## 2023-07-27 MED ORDER — DEXAMETHASONE SODIUM PHOSPHATE 4 MG/ML IJ SOLN
INTRAMUSCULAR | Status: DC | PRN
  Administered 2023-07-27: 5 mg via INTRAVENOUS

## 2023-07-27 MED ORDER — CEFAZOLIN SODIUM-DEXTROSE 2-4 GM/100ML-% IV SOLN
INTRAVENOUS | Status: AC
  Filled 2023-07-27: qty 100

## 2023-07-27 MED ORDER — PHENYLEPHRINE HCL (PRESSORS) 10 MG/ML IV SOLN
INTRAVENOUS | Status: DC | PRN
  Administered 2023-07-27 (×3): 80 ug via INTRAVENOUS

## 2023-07-27 MED ORDER — METHOCARBAMOL 500 MG PO TABS: 500.0000 mg | ORAL_TABLET | Freq: Four times a day (QID) | ORAL | Status: DC | PRN

## 2023-07-27 MED ORDER — DIPHENHYDRAMINE HCL 50 MG/ML IJ SOLN: 12.5000 mg | Freq: Four times a day (QID) | INTRAMUSCULAR | Status: DC | PRN

## 2023-07-27 MED ORDER — OXYCODONE HCL 5 MG/5ML PO SOLN: 5.0000 mg | Freq: Once | ORAL | Status: DC | PRN

## 2023-07-27 MED ORDER — MIDAZOLAM HCL 2 MG/2ML IJ SOLN
2.0000 mg | Freq: Once | INTRAMUSCULAR | Status: AC
  Administered 2023-07-27: 2 mg via INTRAVENOUS

## 2023-07-27 MED ORDER — SENNA 8.6 MG PO TABS
1.0000 | ORAL_TABLET | Freq: Two times a day (BID) | ORAL | Status: DC
  Administered 2023-07-27 – 2023-07-28 (×3): 8.6 mg via ORAL
  Filled 2023-07-27 (×3): qty 1

## 2023-07-27 MED ORDER — CEFAZOLIN SODIUM-DEXTROSE 2-4 GM/100ML-% IV SOLN
2.0000 g | INTRAVENOUS | Status: AC
  Administered 2023-07-27: 2 g via INTRAVENOUS

## 2023-07-27 MED ORDER — MELATONIN 3 MG PO TABS
3.0000 mg | ORAL_TABLET | Freq: Every evening | ORAL | Status: DC | PRN
  Filled 2023-07-27: qty 1

## 2023-07-27 MED ORDER — LIDOCAINE 2% (20 MG/ML) 5 ML SYRINGE
INTRAMUSCULAR | Status: AC
  Filled 2023-07-27: qty 5

## 2023-07-27 MED ORDER — FENTANYL CITRATE (PF) 100 MCG/2ML IJ SOLN
100.0000 ug | Freq: Once | INTRAMUSCULAR | Status: AC
  Administered 2023-07-27: 100 ug via INTRAVENOUS

## 2023-07-27 MED ORDER — ACETAMINOPHEN 500 MG PO TABS
1000.0000 mg | ORAL_TABLET | ORAL | Status: AC
  Administered 2023-07-27: 1000 mg via ORAL

## 2023-07-27 MED ORDER — LACTATED RINGERS IV SOLN: INTRAVENOUS | Status: DC

## 2023-07-27 MED ORDER — KETOROLAC TROMETHAMINE 30 MG/ML IJ SOLN
30.0000 mg | Freq: Four times a day (QID) | INTRAMUSCULAR | Status: AC
  Administered 2023-07-27 (×2): 30 mg via INTRAVENOUS
  Filled 2023-07-27 (×2): qty 1

## 2023-07-27 MED ORDER — DIPHENHYDRAMINE HCL 12.5 MG/5ML PO ELIX: 12.5000 mg | ORAL_SOLUTION | Freq: Four times a day (QID) | ORAL | Status: DC | PRN

## 2023-07-27 MED ORDER — CALCIUM CARBONATE ANTACID 500 MG PO CHEW: 1000.0000 mg | CHEWABLE_TABLET | ORAL | Status: DC | PRN

## 2023-07-27 MED ORDER — ONDANSETRON HCL 4 MG/2ML IJ SOLN
INTRAMUSCULAR | Status: DC | PRN
  Administered 2023-07-27: 4 mg via INTRAVENOUS

## 2023-07-27 MED ORDER — PROCHLORPERAZINE EDISYLATE 10 MG/2ML IJ SOLN: 5.0000 mg | Freq: Four times a day (QID) | INTRAMUSCULAR | Status: DC | PRN

## 2023-07-27 MED ORDER — 0.9 % SODIUM CHLORIDE (POUR BTL) OPTIME
TOPICAL | Status: DC | PRN
  Administered 2023-07-27: 600 mL

## 2023-07-27 MED ORDER — FLUTICASONE PROPIONATE 50 MCG/ACT NA SUSP: 2.0000 | Freq: Every day | NASAL | Status: DC | PRN

## 2023-07-27 MED ORDER — SODIUM CHLORIDE (PF) 0.9 % IJ SOLN
INTRAMUSCULAR | Status: AC
  Filled 2023-07-27: qty 10

## 2023-07-27 MED ORDER — PHENYLEPHRINE 80 MCG/ML (10ML) SYRINGE FOR IV PUSH (FOR BLOOD PRESSURE SUPPORT)
PREFILLED_SYRINGE | INTRAVENOUS | Status: AC
  Filled 2023-07-27: qty 10

## 2023-07-27 MED ORDER — PROPOFOL 10 MG/ML IV BOLUS
INTRAVENOUS | Status: DC | PRN
  Administered 2023-07-27: 200 mg via INTRAVENOUS

## 2023-07-27 MED ORDER — ONDANSETRON HCL 4 MG/2ML IJ SOLN: 4.0000 mg | Freq: Four times a day (QID) | INTRAMUSCULAR | Status: DC | PRN

## 2023-07-27 MED ORDER — FENTANYL CITRATE (PF) 100 MCG/2ML IJ SOLN: INTRAMUSCULAR | Status: DC | PRN

## 2023-07-27 MED ORDER — ROCURONIUM BROMIDE 10 MG/ML (PF) SYRINGE
PREFILLED_SYRINGE | INTRAVENOUS | Status: AC
  Filled 2023-07-27: qty 10

## 2023-07-27 MED ORDER — ROCURONIUM BROMIDE 100 MG/10ML IV SOLN
INTRAVENOUS | Status: DC | PRN
  Administered 2023-07-27: 40 mg via INTRAVENOUS

## 2023-07-27 MED ORDER — HYDROMORPHONE HCL 1 MG/ML IJ SOLN: 0.5000 mg | INTRAMUSCULAR | Status: DC | PRN

## 2023-07-27 SURGICAL SUPPLY — 45 items
ADH SKN CLS APL DERMABOND .7 (GAUZE/BANDAGES/DRESSINGS) ×2
APL PRP STRL LF DISP 70% ISPRP (MISCELLANEOUS) ×1
BIOPATCH RED 1 DISK 7.0 (GAUZE/BANDAGES/DRESSINGS) IMPLANT
BLADE HEX COATED 2.75 (ELECTRODE) ×1 IMPLANT
BLADE SURG 10 STRL SS (BLADE) ×1 IMPLANT
BLADE SURG 15 STRL LF DISP TIS (BLADE) ×1 IMPLANT
BLADE SURG 15 STRL SS (BLADE) ×1
CANISTER SUCT 1200ML W/VALVE (MISCELLANEOUS) ×1 IMPLANT
CHLORAPREP W/TINT 26 (MISCELLANEOUS) ×1 IMPLANT
CLIP TI MEDIUM 6 (CLIP) ×2 IMPLANT
COVER MAYO STAND STRL (DRAPES) IMPLANT
DERMABOND ADVANCED .7 DNX12 (GAUZE/BANDAGES/DRESSINGS) ×1 IMPLANT
DRAIN CHANNEL 19F RND (DRAIN) ×1 IMPLANT
DRAPE UTILITY XL STRL (DRAPES) ×1 IMPLANT
ELECT REM PT RETURN 9FT ADLT (ELECTROSURGICAL) ×1
ELECTRODE REM PT RTRN 9FT ADLT (ELECTROSURGICAL) ×1 IMPLANT
EVACUATOR SILICONE 100CC (DRAIN) ×1 IMPLANT
GAUZE PAD ABD 8X10 STRL (GAUZE/BANDAGES/DRESSINGS) ×2 IMPLANT
GAUZE SPONGE 4X4 12PLY STRL (GAUZE/BANDAGES/DRESSINGS) ×1 IMPLANT
GLOVE BIOGEL PI IND STRL 6.5 (GLOVE) ×1 IMPLANT
GLOVE SURG SS PI 6.5 STRL IVOR (GLOVE) IMPLANT
GOWN STRL REUS W/ TWL LRG LVL3 (GOWN DISPOSABLE) ×1 IMPLANT
GOWN STRL REUS W/ TWL XL LVL3 (GOWN DISPOSABLE) ×1 IMPLANT
GOWN STRL REUS W/TWL LRG LVL3 (GOWN DISPOSABLE) ×1
GOWN STRL REUS W/TWL XL LVL3 (GOWN DISPOSABLE) ×1
LIGHT WAVEGUIDE WIDE FLAT (MISCELLANEOUS) IMPLANT
NS IRRIG 1000ML POUR BTL (IV SOLUTION) ×1 IMPLANT
PACK BASIN DAY SURGERY FS (CUSTOM PROCEDURE TRAY) ×1 IMPLANT
PACK UNIVERSAL I (CUSTOM PROCEDURE TRAY) ×1 IMPLANT
PENCIL SMOKE EVACUATOR (MISCELLANEOUS) ×1 IMPLANT
PIN SAFETY STERILE (MISCELLANEOUS) ×1 IMPLANT
SLEEVE SCD COMPRESS KNEE MED (STOCKING) ×1 IMPLANT
SPONGE T-LAP 18X18 ~~LOC~~+RFID (SPONGE) ×1 IMPLANT
STOCKINETTE IMPERVIOUS LG (DRAPES) IMPLANT
STRIP CLOSURE SKIN 1/2X4 (GAUZE/BANDAGES/DRESSINGS) ×1 IMPLANT
SUT ETHILON 2 0 FS 18 (SUTURE) IMPLANT
SUT MNCRL AB 4-0 PS2 18 (SUTURE) ×4 IMPLANT
SUT PROLENE 2 0 SH DA (SUTURE) IMPLANT
SUT SILK 2 0 SH (SUTURE) IMPLANT
SUT VICRYL 3-0 CR8 SH (SUTURE) ×2 IMPLANT
SYR BULB IRRIG 60ML STRL (SYRINGE) ×1 IMPLANT
TOWEL GREEN STERILE FF (TOWEL DISPOSABLE) ×1 IMPLANT
TUBE CONNECTING 20X1/4 (TUBING) ×1 IMPLANT
UNDERPAD 30X36 HEAVY ABSORB (UNDERPADS AND DIAPERS) ×1 IMPLANT
YANKAUER SUCT BULB TIP NO VENT (SUCTIONS) ×1 IMPLANT

## 2023-07-27 NOTE — Anesthesia Procedure Notes (Signed)
Anesthesia Regional Block: Pectoralis block   Pre-Anesthetic Checklist: , timeout performed,  Correct Patient, Correct Site, Correct Laterality,  Correct Procedure, Correct Position, site marked,  Risks and benefits discussed,  Surgical consent,  Pre-op evaluation,  At surgeon's request and post-op pain management  Laterality: Right and Left  Prep: chloraprep       Needles:  Injection technique: Single-shot  Needle Type: Echogenic Needle     Needle Length: 9cm  Needle Gauge: 21     Additional Needles:   Procedures:,,,, ultrasound used (permanent image in chart),,    Narrative:  Start time: 07/27/2023 9:30 AM End time: 07/27/2023 9:41 AM Injection made incrementally with aspirations every 5 mL.  Performed by: Personally  Anesthesiologist: Marcene Duos, MD

## 2023-07-27 NOTE — H&P (Signed)
PROVIDER: Matthias Hughs, MD Patient Care Team: Edwina Barth, MD as PCP - General (Internal Medicine) Matthias Hughs, MD as Consulting Provider (Surgical Oncology) Buckner Malta, MD (Radiation Oncology) Sabas Sous, MD (Hematology and Oncology)  MRN: [id number] DOB: Mar 03, 1981 DATE OF ENCOUNTER: 06/28/2023  Chief Complaint: RE-CHECK   History of Present Illness: Suzanne Tucker is a 42 y.o. female who is seen today for breast cancer follow up.  Initial history:   Pt presented with a new breast cancer dx right side 01/2023. She felt a mass in her axilla. She went to her doctor who also thought there was an additional mass near the areola. Diagnostic imaging was performed. This showed a 1.1 cm mass at 4 o'clock with a 6 mm satellite nodule. There were also > 4 abnormal nodes seen in the axilla. The dominant breast mass and one of the nodes were biopsied. 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   Pt was set up for MR today and had that as well. There was some additional non mass enhancement adjacent to the tumor and the two tumors in the breast did appear contiguous.   Pt has no family history of cancer.  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 She has a 38 yo daughter in the 9th grade.   Interval history:   Pt had negative genetic testing. Patient has been on neoadjuvant chemotherapy. She required admission for neutropenic fever. She has also had some issues with her fingernails.   Pt has had repeat MR and has complete imaging response to tx. She has overall felt pretty good during surgery. She can't feel her breast mass anymore, but can still feel something in her axilla. She can't feel anything in her neck anymore.    MRI breast 06/23/23  IMPRESSION: 1. No evidence of residual enhancing mass in the lower inner right breast at the biopsy-proven site of malignancy.  2. There are no persistent abnormal  appearing right axillary lymph nodes by MRI.  3. No evidence of left breast malignancy.  RECOMMENDATION: Continue treatment plan for right breast cancer with right axillary metastatic lymph nodes.  BI-RADS CATEGORY 6: Known biopsy-proven malignancy.  Review of Systems: A complete review of systems was obtained from the patient. I have reviewed this information and discussed as appropriate with the patient. See HPI as well for other ROS.  ROS  Otherwise negative.   Medical History: Past Medical History:  Diagnosis Date  History of cancer   Patient Active Problem List  Diagnosis  Malignant neoplasm of lower-inner quadrant of right breast of female, estrogen receptor negative (CMS/HHS-HCC)  Breast cancer metastasized to axillary lymph node, right (CMS/HHS-HCC)  Carpal tunnel syndrome on right  Dizzy spells  Chronic pansinusitis  MVP (mitral valve prolapse)  Palpitations  Axillary mass, right  Weight loss   Past Surgical History:  Procedure Laterality Date  .hysterectomy  Unknown date  .tubal ligation  Unknown date  CESAREAN SECTION  Unknown date    Allergies  Allergen Reactions  Benzonatate Hives  Hives, itching, burning all over.  Morphine Anaphylaxis  Latex Rash  Codeine Nausea And Vomiting  Tramadol Other (See Comments)  Tingling in the mouth with swelling   Current Outpatient Medications on File Prior to Visit  Medication Sig Dispense Refill  fluticasone propionate (FLONASE) 50 mcg/actuation nasal spray Place 2 sprays into both nostrils once daily  multivitamin tablet Take 1 tablet by mouth once daily  sodium chloride  1,000 mg soluble tablet Take 1 g by mouth 3 (three) times daily   No current facility-administered medications on file prior to visit.   Family History  Problem Relation Age of Onset  Stroke Mother    Social History   Tobacco Use  Smoking Status Never  Smokeless Tobacco Never    Social History   Socioeconomic History  Marital  status: Married  Tobacco Use  Smoking status: Never  Smokeless tobacco: Never  Vaping Use  Vaping status: Never Used  Substance and Sexual Activity  Alcohol use: Never  Drug use: Never   Social Determinants of Health   Financial Resource Strain: Low Risk (12/27/2022)  Received from [facility]  Overall Financial Resource Strain (CARDIA)  Difficulty of Paying Living Expenses: Not very hard  Food Insecurity: No Food Insecurity (04/29/2023)  [facility] from [facility]  Hunger Vital Sign  Worried About Running Out of Food in the Last Year: Never true  Ran Out of Food in the Last Year: Never true  Transportation Needs: No Transportation Needs (04/29/2023)  Received from [facility] - Transportation  Lack of Transportation (Medical): No  Lack of Transportation (Non-Medical): No  Physical Activity: Insufficiently Active (12/27/2022)  Received from [facility]  Exercise Vital Sign  Days of Exercise per Week: 4 days  Minutes of Exercise per Session: 20 min  Stress: No Stress Concern Present (12/27/2022)  Received from [facility] - Occupational Stress Questionnaire  Feeling of Stress : Only a little  Social Connections: Moderately Isolated (12/27/2022)  Received from [facility]  Social Connection and Isolation Panel [NHANES]  Frequency of Communication with Friends and Family: More than three times a week  Frequency of Social Gatherings with Friends and Family: Once a week  Attends Religious Services: Never  Database administrator or Organizations: No  Marital Status: Married   Objective:   Vitals:  06/28/23 0927  BP: 102/70  Pulse: 101  Temp: 36.9 C (98.4 F)  SpO2: 99%  Weight: 67.4 kg (148 lb 9.6 oz)  Height: 154.9 cm (5\' 1" )  PainSc: 0-No pain  PainLoc: Breast   Body mass index is 28.08 kg/m.  Head: Normocephalic and atraumatic.  Eyes: Conjunctivae are normal. Pupils are equal, round, and reactive to light. No  scleral icterus.  Neck: Normal range of motion. Neck supple. No tracheal deviation present. No thyromegaly present.  Resp: No respiratory distress, normal effort. Breast:no palpable masses. No skin dimpling. No nipple retraction. Still a bit of thickening low axilla on right. No other LAD>  Neurological: Alert and oriented to person, place, and time. Coordination normal.  Skin: Skin is warm and dry. No rash noted. No diaphoretic. No erythema. No pallor.  Psychiatric: Normal mood and affect. Normal behavior. Judgment and thought content normal.   Labs, Imaging and Diagnostic Testing:  06/24/23 HCT 30, CMET essentially nl  Assessment and Plan:   Diagnoses and all orders for this visit:  Malignant neoplasm of lower-inner quadrant of right breast of female, estrogen receptor negative (CMS/HHS-HCC)  Breast cancer metastasized to axillary lymph node, right (CMS/HHS-HCC)  Patient desires bilateral mastectomies. We will also do right axillary lymph node dissection. I have sent a message to Dr. Ave Filter inquiring about her Port-A-Cath. I reviewed the procedure with the patient and her mother. I discussed incisions, reviewed recovery expectations and pain control. I discussed drains.  I reviewed risk of bleeding, infection, chronic numbness, chronic pain, dissatisfaction with scars and possible need  for scar revision, possible return to the operating room or blood transfusion, possible heart or lung issues, possible prolonged recovery time. Reviewed risk of damage to the long thoracic nerve with potential applications of the scapula position.   Patient understands and wishes to proceed. No follow-ups on file.  Matthias Hughs, MD

## 2023-07-27 NOTE — Anesthesia Postprocedure Evaluation (Signed)
Anesthesia Post Note  Patient: Suzanne Tucker  Procedure(s) Performed: BILATERAL MASTECTOMY WITH RIGHT AXILLARY LYMPH NODE DISSECTION (Bilateral: Breast)     Patient location during evaluation: PACU Anesthesia Type: General Level of consciousness: awake and alert Pain management: pain level controlled Vital Signs Assessment: post-procedure vital signs reviewed and stable Respiratory status: spontaneous breathing, nonlabored ventilation, respiratory function stable and patient connected to nasal cannula oxygen Cardiovascular status: blood pressure returned to baseline and stable Postop Assessment: no apparent nausea or vomiting Anesthetic complications: no  No notable events documented.  Last Vitals:  Vitals:   07/27/23 1400 07/27/23 1424  BP: 104/68 114/77  Pulse: 87 88  Resp: 16 18  Temp:    SpO2: 96% 98%    Last Pain:  Vitals:   07/27/23 1424  TempSrc:   PainSc: 3                  Kennieth Rad

## 2023-07-27 NOTE — Anesthesia Preprocedure Evaluation (Signed)
Anesthesia Evaluation  Patient identified by MRN, date of birth, ID band Patient awake    Reviewed: Allergy & Precautions, NPO status , Patient's Chart, lab work & pertinent test results  Airway Mallampati: II  TM Distance: >3 FB Neck ROM: Full    Dental  (+) Dental Advisory Given   Pulmonary sleep apnea    breath sounds clear to auscultation       Cardiovascular negative cardio ROS  Rhythm:Regular Rate:Normal     Neuro/Psych negative neurological ROS     GI/Hepatic negative GI ROS, Neg liver ROS,,,  Endo/Other  negative endocrine ROS    Renal/GU negative Renal ROS     Musculoskeletal   Abdominal   Peds  Hematology  (+) Blood dyscrasia, anemia   Anesthesia Other Findings   Reproductive/Obstetrics                             Anesthesia Physical Anesthesia Plan  ASA: 2  Anesthesia Plan: General   Post-op Pain Management: Regional block*, Tylenol PO (pre-op)* and Toradol IV (intra-op)*   Induction:   PONV Risk Score and Plan: 3 and Midazolam, Dexamethasone and Ondansetron  Airway Management Planned: Oral ETT  Additional Equipment: None  Intra-op Plan:   Post-operative Plan: Extubation in OR  Informed Consent: I have reviewed the patients History and Physical, chart, labs and discussed the procedure including the risks, benefits and alternatives for the proposed anesthesia with the patient or authorized representative who has indicated his/her understanding and acceptance.     Dental advisory given  Plan Discussed with: CRNA  Anesthesia Plan Comments:         Anesthesia Quick Evaluation

## 2023-07-27 NOTE — Op Note (Signed)
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 The risks, benefits, complications, treatment options, and expected outcomes were discussed with the patient. The possibilities of reaction to medication, pulmonary aspiration, bleeding, infection, the need for additional procedures, failure to diagnose a condition, and creating a complication requiring transfusion or operation were discussed with the patient. The patient concurred with the proposed plan, giving informed consent.  The site of surgery properly noted/marked.   The patient was taken to Operating Room # 8 and was placed supine on the OR table.  General anesthesia was induced.  After induction of anesthesia, the right arm, bilateral breast, and chest were prepped and draped in standard fashion.    The patient was reindentified as Multimedia programmer and the procedure verified as bilateral Mastectomy and right sentinel node biopsy. A Time Out was held and the above information confirmed.   The left side was addressed first.  The borders of the breast were identified and marked.  The incision was drawn out to make sure incision lines were equidistant in length.    The superior incision was made with the #10 blade.  Mastectomy hooks were used to provide elevation of the skin edges, and the cautery was used to create the mastectomy flaps.  The dissection was taken to the fascia of the pectoralis major.  The penetrating vessels were clipped as needed.  The superior flap was taken medially to the lateral sternal border, superiorly  to the inferior border of the clavicle.  Care was taken to leave the port in place.  It was resecured to the pectoralis at the lower aspect with two 2-0 prolene sutures.  The inferior flap was similarly created, inferiorly to the inframammary fold and laterally to the border of the latissimus.  The breast was taken off including the pectoralis fascia and the axillary tail marked.    Hemostasis was achieved with cautery.  The wound was irrigated. The wound was reinspected for hemostasis.  One 19 Blake drain was placed laterally and secured with a 2-0 nylon.  The skin was then closed with a 3-0 Vicryl deep dermal interrupted sutures and 4-0 Vicryl subcuticular closure in layers.  The right side was then addressed similarly in terms of the mastectomy.  Care was taken to try to make both incisions symmetric and equidistant in length.  After the breast was removed, an axillary dissection was performed with removal of the associated lymph nodes and surrounding adipose tissue. This included levels I and II. This was accomplished by exposing the axillary vein anteriorly and inferiorly to the level of the pectoralis minor and laterally over the latissimus dorsi muscle. Posteriorly, the dissection continued to the subscapularis.  The long thoracic and thoracodorsal neurovascular bundles were identified and preserved.  Small venous tributaries, lymphatics, and vessels were clipped and ligated or cauterized and divided. The subscapularis muscle was skeletonized. The wound was irrigated and closed with a 3-0 Vicryl deep dermal interrupted and a 4-0 vicryl subcuticular closure in layers.  Hemostasis was achieved with cautery.  The wound was irrigated. The wound was reinspected for hemostasis.  Two 19 Blake drains were placed laterally and secured with a  2-0 nylon.  The lateral most drain is in the right axilla and the medial drain is over the pectoralis.  The skin was then closed with a 3-0 Vicryl deep dermal interrupted  sutures and 4-0 Vicryl subcuticular closure in layers       Sterile dressings were applied. At the end of the operation, all sponge, instrument, and needle counts were correct.  Findings: grossly clear surgical margins, nodes: several palpable nodes in the right axilla. Good visualization of the long thoracic and the thoracodorsal nerve with avoidance of both.   Estimated Blood Loss: 50 mL          Drains: 19 Fr blake drain in left chest wall, 19 fr blake drain right axilla, 10 Fr blake drain right chest wall                Specimens: left breast, right breast and right axillary contents.           Complications:  None; patient tolerated the procedure well.         Disposition: PACU - hemodynamically stable.         Condition: stable

## 2023-07-27 NOTE — Discharge Instructions (Addendum)
[facility], [place] [id number]  MASTECTOMY: POST OP INSTRUCTIONS  Always review your discharge instruction sheet given to you by the facility where your surgery was performed. IF YOU HAVE DISABILITY OR FAMILY LEAVE FORMS, YOU MUST BRING THEM TO THE OFFICE FOR PROCESSING.   DO NOT GIVE THEM TO YOUR DOCTOR. A prescription for pain medication may be given to you upon discharge.  Take your pain medication as prescribed, if needed.  If narcotic pain medicine is not needed, then you may take acetaminophen (Tylenol) or ibuprofen (Advil) as needed. Take your usually prescribed medications unless otherwise directed. If you need a refill on your pain medication, please contact your pharmacy.  They will contact our office to request authorization.  Prescriptions will not be filled after 5pm or on week-ends. You should follow a light diet the first few days after arrival home, such as soup and crackers, etc.  Resume your normal diet the day after surgery. Most patients will experience some swelling and bruising on the chest and underarm.  Ice packs will help.  Swelling and bruising can take several days to resolve.  It is common to experience some constipation if taking pain medication after surgery.  Increasing fluid intake and taking a stool softener (such as Colace) will usually help or prevent this problem from occurring.  A mild laxative (Milk of Magnesia or Miralax) should be taken according to package instructions if there are no bowel movements after 48 hours. Unless discharge instructions indicate otherwise, leave your bandage dry and in place until your next appointment in 3-5 days.    It is ok to take a SHORT shower every 2-3 days after the third day post op.  You may have steri-strips (small skin tapes) in place directly over the incision.  These strips should be left on the skin for 7-10 days.  If your surgeon used skin glue on the incision, you may shower in 24 hours.  The glue will  flake off over the next 2-3 weeks.  Any sutures or staples will be removed at the office during your follow-up visit. DRAINS:  If you have drains in place, it is important to keep a list of the amount of drainage produced each day in your drains.  Before leaving the hospital, you should be instructed on drain care.  Call our office if you have any questions about your drains. ACTIVITIES:  You may resume regular (light) daily activities beginning the next day--such as daily self-care, walking, climbing stairs--gradually increasing activities as tolerated.  You may have sexual intercourse when it is comfortable.  Refrain from any heavy lifting or straining until approved by your doctor. You may drive when you are no longer taking prescription pain medication, you can comfortably wear a seatbelt, and you can safely maneuver your car and apply brakes. RETURN TO WORK:  __________________________________________________________ Suzanne Tucker should see your doctor in the office for a follow-up appointment approximately 3-5 days after your surgery.  Your doctor's nurse will typically make your follow-up appointment when she calls you with your pathology report.  Expect your pathology report 2-3 business days after your surgery.  You may call to check if you do not hear from [place] after three days.   OTHER INSTRUCTIONS: ______________________________________________________________________________________________ ____________________________________________________________________________________________ WHEN TO CALL YOUR DOCTOR: Fever over 101.0 Nausea and/or vomiting Extreme swelling or bruising Continued bleeding from incision. Increased pain, redness, or drainage from the incision. The clinic staff is available to answer your questions during regular business hours.  Please don't  hesitate to call and ask to speak to one of the nurses for clinical concerns.  If you have a medical emergency, go to the nearest emergency  room or call 911.  A surgeon from [facility] is always on call at the hospital. [address], [place], [place]  [address] ? [address] [address], [place], [place]   [address] [phone] ? [phone] ? FAX [phone] Web site: [email or web address]   Post Anesthesia Home Care Instructions  Activity: Get plenty of rest for the remainder of the day. A responsible individual must stay with you for 24 hours following the procedure.  For the next 24 hours, DO NOT: -Drive a car -Advertising copywriter -Drink alcoholic beverages -Take any medication unless instructed by your physician -Make any legal decisions or sign important papers.  Meals: Start with liquid foods such as gelatin or soup. Progress to regular foods as tolerated. Avoid greasy, spicy, heavy foods. If nausea and/or vomiting occur, drink only clear liquids until the nausea and/or vomiting subsides. Call your physician if vomiting continues.  Special Instructions/Symptoms: Your throat may feel dry or sore from the anesthesia or the breathing tube placed in your throat during surgery. If this causes discomfort, gargle with warm salt water. The discomfort should disappear within 24 hours.  If you had a scopolamine patch placed behind your ear for the management of post- operative nausea and/or vomiting:  1. The medication in the patch is effective for 72 hours, after which it should be removed.  Wrap patch in a tissue and discard in the trash. Wash hands thoroughly with soap and water. 2. You may remove the patch earlier than 72 hours if you experience unpleasant side effects which may include dry mouth, dizziness or visual disturbances. 3. Avoid touching the patch. Wash your hands with soap and water after contact with the patch.  Regional Anesthesia Blocks  1. You may not be able to move or feel the "blocked" extremity after a regional anesthetic block. This may last may last from 3-48 hours after placement, but it  will go away. The length of time depends on the medication injected and your individual response to the medication. As the nerves start to wake up, you may experience tingling as the movement and feeling returns to your extremity. If the numbness and inability to move your extremity has not gone away after 48 hours, please call your surgeon.   2. The extremity that is blocked will need to be protected until the numbness is gone and the strength has returned. Because you cannot feel it, you will need to take extra care to avoid injury. Because it may be weak, you may have difficulty moving it or using it. You may not know what position it is in without looking at it while the block is in effect.  3. For blocks in the legs and feet, returning to weight bearing and walking needs to be done carefully. You will need to wait until the numbness is entirely gone and the strength has returned. You should be able to move your leg and foot normally before you try and bear weight or walk. You will need someone to be with you when you first try to ensure you do not fall and possibly risk injury.  4. Bruising and tenderness at the needle site are common side effects and will resolve in a few days.  5. Persistent numbness or new problems with movement should be communicated to the surgeon or the  [facility] [phone] [facility] [phone]).  About my Jackson-Pratt Bulb Drain  What is a Jackson-Pratt bulb? A Jackson-Pratt is a soft, round device used to collect drainage. It is connected to a long, thin drainage catheter, which is held in place by one or two small stiches near your surgical incision site. When the bulb is squeezed, it forms a vacuum, forcing the drainage to empty into the bulb.  Emptying the Jackson-Pratt bulb- To empty the bulb: 1. Release the plug on the top of the bulb. 2. Pour the bulb's contents into a measuring container which your nurse will provide. 3.  Record the time emptied and amount of drainage. Empty the drain(s) as often as your     doctor or nurse recommends.  Date              Time              Amount (Drain 1)           Amount (Drain 2)           Amount (Drain 3)                _____________________________________________________________________                 _____________________________________________________________________                _____________________________________________________________________                 _____________________________________________________________________                 _____________________________________________________________________                 _____________________________________________________________________                  _____________________________________________________________________                  _____________________________________________________________________  Squeezing the Jackson-Pratt Bulb- To squeeze the bulb: 1. Make sure the plug at the top of the bulb is open. 2. Squeeze the bulb tightly in your fist. You will hear air squeezing from the bulb. 3. Replace the plug while the bulb is squeezed. 4. Use a safety pin to attach the bulb to your clothing. This will keep the catheter from     pulling at the bulb insertion site.  When to call your doctor- Call your doctor if: Drain site becomes red, swollen or hot. You have a fever greater than 101 degrees F. There is oozing at the drain site. Drain falls out (apply a guaze bandage over the drain hole and secure it with tape). Drainage increases daily not related to activity patterns. (You will usually have more drainage when you are active than when you are resting.) Drainage has a bad odor.

## 2023-07-27 NOTE — Interval H&P Note (Signed)
History and Physical Interval Note:  07/27/2023 9:16 AM  Suzanne Tucker  has presented today for surgery, with the diagnosis of RIGHT BREAST CANCER, HIGH RISK.  The various methods of treatment have been discussed with the patient and family. After consideration of risks, benefits and other options for treatment, the patient has consented to  Procedure(s) with comments: BILATERAL MASTECTOMY WITH RIGHT AXILLARY LYMPH NODE DISSECTION (Bilateral) - PEC BLOCK as a surgical intervention.  The patient's history has been reviewed, patient examined, no change in status, stable for surgery.  I have reviewed the patient's chart and labs.  Questions were answered to the patient's satisfaction.     [name]

## 2023-07-27 NOTE — Progress Notes (Signed)
Assisted Dr. Casilda Carls with left and right, pectoralis, ultrasound guided block. Side rails up, monitors on throughout procedure. See vital signs in flow sheet. Tolerated Procedure well.

## 2023-07-27 NOTE — Anesthesia Procedure Notes (Signed)
Procedure Name: Intubation Date/Time: 07/27/2023 9:58 AM  Performed by: Ronnette Hila, CRNAPre-anesthesia Checklist: Patient identified, Emergency Drugs available, Suction available and Patient being monitored Patient Re-evaluated:Patient Re-evaluated prior to induction Oxygen Delivery Method: Circle system utilized Preoxygenation: Pre-oxygenation with 100% oxygen Induction Type: IV induction Ventilation: Mask ventilation without difficulty Laryngoscope Size: Mac and 3 Grade View: Grade I Tube type: Oral Tube size: 7.0 mm Number of attempts: 1 Airway Equipment and Method: Stylet and Oral airway Placement Confirmation: ETT inserted through vocal cords under direct vision, positive ETCO2 and breath sounds checked- equal and bilateral Secured at: 22 cm Tube secured with: Tape Dental Injury: Teeth and Oropharynx as per pre-operative assessment

## 2023-07-27 NOTE — Transfer of Care (Signed)
Immediate Anesthesia Transfer of Care Note  Patient: Suzanne Tucker  Procedure(s) Performed: BILATERAL MASTECTOMY WITH RIGHT AXILLARY LYMPH NODE DISSECTION (Bilateral: Breast)  Patient Location: PACU  Anesthesia Type:GA combined with regional for post-op pain  Level of Consciousness: awake, alert , oriented, and patient cooperative  Airway & Oxygen Therapy: Patient Spontanous Breathing and Patient connected to face mask oxygen  Post-op Assessment: Report given to RN and Post -op Vital signs reviewed and stable  Post vital signs: Reviewed and stable  Last Vitals:  Vitals Value Taken Time  BP    Temp    Pulse    Resp    SpO2      Last Pain:  Vitals:   07/27/23 0730  TempSrc: Oral  PainSc: 0-No pain      Patients Stated Pain Goal: 4 (07/27/23 0730)  Complications: No notable events documented.

## 2023-07-28 ENCOUNTER — Encounter (HOSPITAL_BASED_OUTPATIENT_CLINIC_OR_DEPARTMENT_OTHER): Payer: Self-pay | Admitting: General Surgery

## 2023-07-28 DIAGNOSIS — C50311 Malignant neoplasm of lower-inner quadrant of right female breast: Secondary | ICD-10-CM | POA: Diagnosis not present

## 2023-07-28 MED ORDER — IBUPROFEN 600 MG PO TABS
600.0000 mg | ORAL_TABLET | Freq: Four times a day (QID) | ORAL | 1 refills | Status: DC | PRN
Start: 2023-07-28 — End: 2023-09-07

## 2023-07-28 MED ORDER — METHOCARBAMOL 500 MG PO TABS: 500.0000 mg | ORAL_TABLET | Freq: Four times a day (QID) | ORAL | 0 refills | Status: DC | PRN

## 2023-08-01 ENCOUNTER — Telehealth: Payer: Self-pay | Admitting: General Surgery

## 2023-08-01 ENCOUNTER — Encounter: Payer: Self-pay | Admitting: *Deleted

## 2023-08-01 DIAGNOSIS — C50311 Malignant neoplasm of lower-inner quadrant of right female breast: Secondary | ICD-10-CM

## 2023-08-01 NOTE — Telephone Encounter (Signed)
No answer on phone- Sent mychart message

## 2023-08-02 ENCOUNTER — Encounter: Payer: Self-pay | Admitting: *Deleted

## 2023-08-02 ENCOUNTER — Inpatient Hospital Stay: Payer: Managed Care, Other (non HMO) | Attending: Hematology and Oncology | Admitting: Hematology and Oncology

## 2023-08-02 ENCOUNTER — Encounter: Payer: Self-pay | Admitting: Hematology and Oncology

## 2023-08-02 DIAGNOSIS — C50311 Malignant neoplasm of lower-inner quadrant of right female breast: Secondary | ICD-10-CM | POA: Diagnosis not present

## 2023-08-02 DIAGNOSIS — Z171 Estrogen receptor negative status [ER-]: Secondary | ICD-10-CM

## 2023-08-02 LAB — SURGICAL PATHOLOGY

## 2023-08-02 NOTE — Progress Notes (Signed)
HEMATOLOGY-ONCOLOGY TELEPHONE VISIT PROGRESS NOTE  I connected with our patient on 08/02/23 at 11:00 AM EDT by telephone and verified that I am speaking with the correct person using two identifiers.  I discussed the limitations, risks, security and privacy concerns of performing an evaluation and management service by telephone and the availability of in person appointments.  I also discussed with the patient that there may be a patient responsible charge related to this service. The patient expressed understanding and agreed to proceed.   History of Present Illness: Follow-up after surgery to discuss pathology report   History of Present Illness   The patient, with a history of breast cancer, presents for a post-operative follow-up after recent surgery. She reports feeling 'very positive' about the surgery and is scheduled to have drains removed later in the week.   The patient expresses concern about the results of the surgery, feeling 'a little defeated.' She was informed that eight out of twenty lymph nodes had cancer, with some showing extra nodal extension. She was reassured that this does not mean the cancer has spread, but rather that it is coming to the surface of the lymph node. She also had questions about the term 'lymphovascular invasion,' which was explained as indicating invasion into the lymphatic channels and how the cancer made its way to the lymph nodes.  The patient also has concerns about a 16mm lymph node that was not picked up on MRI.  The patient is also dealing with the psychological impact of her diagnosis, expressing feelings of being 'defeated' and concerns about the aggressiveness of her cancer.        Oncology History  Malignant neoplasm of lower-inner quadrant of right breast of female, estrogen receptor negative (HCC)  01/10/2023 Initial Diagnosis   Palpable lump right axilla x 2 weeks bulky axillary lymphadenopathy ultrasound breast: 2 masses at 4 o'clock  position 1.1 cm and 0.6 cm multiple axillary lymph nodes largest 4.5 cm also level 2 and 3 (subpectoral lymph node as well): Biopsy: Grade 3 IDC ER 0%, PR 0%, Ki-67 95%, HER2 1+   01/26/2023 Genetic Testing   Negative [employer] Custom Panel (37 genes).  Report date is 01/26/2023.    The [employer] Custom Cancers + RNA Panel includes sequencing, deletion/duplication, and RNA analysis of the following 37 genes: APC, ATM, AXIN2, BARD1, BMPR1A, BRCA1, BRCA2, BRIP1, CDH1, CDK4*, CDKN2A (p14ARF)*, CDKN2A (p16INK4a), CHEK2, CTNNA1, DICER1, EPCAM*, FH, GREM1*, HOXB13*, MBD4*, MLH1, MSH2, MSH3, MSH6, MUTYH, NF1, NTHL1, PALB2, PMS2, POLD1, POLE, PTEN, RAD51C, RAD51D, SMAD4, SMARCA4, STK11, TP53.  *Genes without RNA analysis.    01/28/2023 - 06/27/2023 Chemotherapy   Patient is on Treatment Plan : BREAST Pembrolizumab (200) D1 + Carboplatin (1.5) D1,8,15 + Paclitaxel (80) D1,8,15 q21d X 4 cycles / Pembrolizumab (200) D1 + AC D1 q21d x 4 cycles     07/15/2023 -  Chemotherapy   Patient is on Treatment Plan : BREAST Pembrolizumab (200) q21d x 27 weeks       REVIEW OF SYSTEMS:   Constitutional: Denies fevers, chills or abnormal weight loss All other systems were reviewed with the patient and are negative. Observations/Objective:     Assessment Plan:  Malignant neoplasm of lower-inner quadrant of right breast of female, estrogen receptor negative (HCC) /05/2023:Palpable lump right axilla x 2 weeks bulky axillary lymphadenopathy ultrasound breast: 2 masses at 4 o'clock position 1.1 cm and 0.6 cm multiple axillary lymph nodes largest 4.5 cm also level 2 and 3 (subpectoral lymph node as well): Biopsy: Grade  3 IDC ER 0%, PR 0%, Ki-67 95%, HER2 1+  01/25/2023: PET/CT: Right breast cancer, right axilla, retropectoral and right cervical lymph nodes   Treatment plan: Neoadjuvant chemotherapy with Taxol carbo Keytruda weekly x 12 followed by Adriamycin Cytoxan Keytruda followed by Rande Lawman maintenance 07/27/2023: Bilateral  mastectomies: Left mastectomy: Benign, right mastectomy: No evidence of residual cancer, 8/20 lymph nodes positive with extranodal extension and 2 lymph nodes Adjuvant radiation therapy -------------------------------------------------------------------------------------------------------- Pathology counseling: I discussed the final pathology report of the patient provided  a copy of this report. I discussed the margins as well as lymph node surgeries. We also discussed the final staging along with previously performed ER/PR and HER-2/neu testing.  Treatment plan: Recommended participating in ASCENT-05 clinical trial for patients are randomized between Keytruda versus Keytruda plus Sacituzumab versus Keytruda plus capecitabine Adjuvant radiation therapy  I discussed with the patient that we will monitor her very closely for recurrence.   I discussed the assessment and treatment plan with the patient. The patient was provided an opportunity to ask questions and all were answered. The patient agreed with the plan and demonstrated an understanding of the instructions. The patient was advised to call back or seek an in-person evaluation if the symptoms worsen or if the condition fails to improve as anticipated.   I provided 12 minutes of non-face-to-face time during this encounter.  This includes time for charting and coordination of care   Tamsen Meek, MD

## 2023-08-02 NOTE — Assessment & Plan Note (Signed)
/  05/2023:Palpable lump right axilla x 2 weeks bulky axillary lymphadenopathy ultrasound breast: 2 masses at 4 o'clock position 1.1 cm and 0.6 cm multiple axillary lymph nodes largest 4.5 cm also level 2 and 3 (subpectoral lymph node as well): Biopsy: Grade 3 IDC ER 0%, PR 0%, Ki-67 95%, HER2 1+  01/25/2023: PET/CT: Right breast cancer, right axilla, retropectoral and right cervical lymph nodes   Treatment plan: Neoadjuvant chemotherapy with Taxol carbo Keytruda weekly x 12 followed by Adriamycin Cytoxan Keytruda followed by Rande Lawman maintenance 07/27/2023: Bilateral mastectomies: Left mastectomy: Benign, right mastectomy: No evidence of residual cancer, 8/20 lymph nodes positive with extranodal extension and 2 lymph nodes Adjuvant radiation therapy -------------------------------------------------------------------------------------------------------- Pathology counseling: I discussed the final pathology report of the patient provided  a copy of this report. I discussed the margins as well as lymph node surgeries. We also discussed the final staging along with previously performed ER/PR and HER-2/neu testing.  Treatment plan: Recommended participating in ASCENT-05 clinical trial for patients are randomized between Keytruda versus Keytruda plus Sacituzumab versus Keytruda plus capecitabine Adjuvant radiation therapy  I discussed with the patient that we will monitor her very closely for recurrence.

## 2023-08-02 NOTE — Progress Notes (Signed)
Study of Sacituzumab Govitecan-hziy and Pembrolizumab Versus Treatment ofPhysician's Choice in Patients With Triple Negative Breast Cancer Who Have Residual Invasive Disease After Surgery and Neoadjuvant Therapy Dr. [name] referred patient for the above clinical trial. This research nurse reviewed patient's medical history for trial eligibility and found that patient is not eligible for the above study due to clinical T and N stage do not meet inclusion criteria. Dr. [name] was notified. Patient was notified by this research nurse and she verbalized understanding.  She plans to follow up with Lillard Anes, NP as scheduled this Friday.  Domenica Reamer, BSN, RN, [employer] Clinical Research Nurse II [phone] 08/02/2023 3:06 PM

## 2023-08-05 ENCOUNTER — Inpatient Hospital Stay: Payer: Managed Care, Other (non HMO)

## 2023-08-05 ENCOUNTER — Inpatient Hospital Stay: Payer: Managed Care, Other (non HMO) | Attending: Hematology and Oncology

## 2023-08-05 ENCOUNTER — Inpatient Hospital Stay (HOSPITAL_BASED_OUTPATIENT_CLINIC_OR_DEPARTMENT_OTHER): Payer: Managed Care, Other (non HMO) | Admitting: Adult Health

## 2023-08-05 DIAGNOSIS — Z79899 Other long term (current) drug therapy: Secondary | ICD-10-CM | POA: Insufficient documentation

## 2023-08-05 DIAGNOSIS — Z5111 Encounter for antineoplastic chemotherapy: Secondary | ICD-10-CM | POA: Diagnosis present

## 2023-08-05 DIAGNOSIS — C50311 Malignant neoplasm of lower-inner quadrant of right female breast: Secondary | ICD-10-CM | POA: Insufficient documentation

## 2023-08-05 DIAGNOSIS — Z171 Estrogen receptor negative status [ER-]: Secondary | ICD-10-CM

## 2023-08-05 DIAGNOSIS — Z95828 Presence of other vascular implants and grafts: Secondary | ICD-10-CM

## 2023-08-05 LAB — CBC WITH DIFFERENTIAL (CANCER CENTER ONLY)
Abs Immature Granulocytes: 0.02 10*3/uL (ref 0.00–0.07)
Basophils Absolute: 0 10*3/uL (ref 0.0–0.1)
Basophils Relative: 0 %
Eosinophils Absolute: 1.5 10*3/uL — ABNORMAL HIGH (ref 0.0–0.5)
Eosinophils Relative: 27 %
HCT: 30.1 % — ABNORMAL LOW (ref 36.0–46.0)
Hemoglobin: 10.6 g/dL — ABNORMAL LOW (ref 12.0–15.0)
Immature Granulocytes: 0 %
Lymphocytes Relative: 14 %
Lymphs Abs: 0.8 10*3/uL (ref 0.7–4.0)
MCH: 32.4 pg (ref 26.0–34.0)
MCHC: 35.2 g/dL (ref 30.0–36.0)
MCV: 92 fL (ref 80.0–100.0)
Monocytes Absolute: 0.4 10*3/uL (ref 0.1–1.0)
Monocytes Relative: 8 %
Neutro Abs: 2.8 10*3/uL (ref 1.7–7.7)
Neutrophils Relative %: 51 %
Platelet Count: 231 10*3/uL (ref 150–400)
RBC: 3.27 MIL/uL — ABNORMAL LOW (ref 3.87–5.11)
RDW: 12.7 % (ref 11.5–15.5)
WBC Count: 5.5 10*3/uL (ref 4.0–10.5)
nRBC: 0 % (ref 0.0–0.2)

## 2023-08-05 LAB — CMP (CANCER CENTER ONLY)
ALT: 9 U/L (ref 0–44)
AST: 15 U/L (ref 15–41)
Albumin: 4.2 g/dL (ref 3.5–5.0)
Alkaline Phosphatase: 78 U/L (ref 38–126)
Anion gap: 7 (ref 5–15)
BUN: 6 mg/dL (ref 6–20)
CO2: 27 mmol/L (ref 22–32)
Calcium: 9.7 mg/dL (ref 8.9–10.3)
Chloride: 106 mmol/L (ref 98–111)
Creatinine: 0.6 mg/dL (ref 0.44–1.00)
GFR, Estimated: >60 mL/min (ref >60)
Glucose, Bld: 87 mg/dL (ref 70–99)
Potassium: 4.1 mmol/L (ref 3.5–5.1)
Sodium: 140 mmol/L (ref 135–145)
Total Bilirubin: 0.4 mg/dL (ref 0.3–1.2)
Total Protein: 7.4 g/dL (ref 6.5–8.1)

## 2023-08-05 MED ORDER — SODIUM CHLORIDE 0.9 % IV SOLN
200.0000 mg | Freq: Once | INTRAVENOUS | Status: AC
  Administered 2023-08-05: 200 mg via INTRAVENOUS
  Filled 2023-08-05: qty 200

## 2023-08-05 MED ORDER — SODIUM CHLORIDE 0.9% FLUSH: 10.0000 mL | Freq: Once | INTRAVENOUS | Status: DC

## 2023-08-05 MED ORDER — SODIUM CHLORIDE 0.9 % IV SOLN: Freq: Once | INTRAVENOUS | Status: AC

## 2023-08-05 NOTE — Progress Notes (Signed)
Patient does not want to use her Port due to recent mastectomy and soreness from surgery. Asked Causey NP if it was ok to start PIV and gave me the ok to use the left arm.

## 2023-08-05 NOTE — Progress Notes (Signed)
[facility] Cancer Follow up:    Suzanne Croissant, MD [address] [place] [address]   DIAGNOSIS:  Cancer Staging  Malignant neoplasm of lower-inner quadrant of right breast of female, estrogen receptor negative (HCC) Staging form: Breast, AJCC 8th Edition - Clinical: Stage IIIC (cT2, cN3c, cM0, G3, ER-, PR-, HER2-) - Unsigned Histologic grading system: 3 grade system   SUMMARY OF ONCOLOGIC HISTORY: Oncology History  Malignant neoplasm of lower-inner quadrant of right breast of female, estrogen receptor negative (HCC)  01/10/2023 Initial Diagnosis   Palpable lump right axilla x 2 weeks bulky axillary lymphadenopathy ultrasound breast: 2 masses at 4 o'clock position 1.1 cm and 0.6 cm multiple axillary lymph nodes largest 4.5 cm also level 2 and 3 (subpectoral lymph node as well): Biopsy: Grade 3 IDC ER 0%, PR 0%, Ki-67 95%, HER2 1+   01/26/2023 Genetic Testing   Negative Invitae Custom Panel (37 genes).  Report date is 01/26/2023.    The [employer]ae Custom Cancers + RNA Panel includes sequencing, deletion/duplication, and RNA analysis of the following 37 genes: APC, ATM, AXIN2, BARD1, BMPR1A, BRCA1, BRCA2, BRIP1, CDH1, CDK4*, CDKN2A (p14ARF)*, CDKN2A (p16INK4a), CHEK2, CTNNA1, DICER1, EPCAM*, FH, GREM1*, HOXB13*, MBD4*, MLH1, MSH2, MSH3, MSH6, MUTYH, NF1, NTHL1, PALB2, PMS2, POLD1, POLE, PTEN, RAD51C, RAD51D, SMAD4, SMARCA4, STK11, TP53.  *Genes without RNA analysis.    01/28/2023 - 06/27/2023 Chemotherapy   Patient is on Treatment Plan : BREAST Pembrolizumab (200) D1 + Carboplatin (1.5) D1,8,15 + Paclitaxel (80) D1,8,15 q21d X 4 cycles / Pembrolizumab (200) D1 + AC D1 q21d x 4 cycles     07/15/2023 -  Chemotherapy   Patient is on Treatment Plan : BREAST Pembrolizumab (200) q21d x 27 weeks       CURRENT THERAPY: Keytruda  INTERVAL HISTORY:  Discussed the use of AI scribe software for clinical note transcription with the patient, who gave verbal consent to  proceed.  Suzanne Tucker 42 y.o. female returns for follow-up of her breast cancer.  She underwent bilateral mastectomies on July 27, 2023 that demonstrated benign breast tissue in the left breast, no residual carcinoma in the right breast, 8 out of 20 lymph nodes positive for metastatic carcinoma, extranodal extension was identified in 2 lymph nodes.  Her biomarkers were repeated and were again triple negative.  She was recommended for participation in the Ascent-05 clinical trial, however she did not meet criteria for the trial.    The patient reports that although her surgery was successful, she feels her pathology report was unfavorable. The patient reports that the cells were grade three and there was 95% growth fraction. Eight lymph nodes were found to be cancerous, which was higher than anticipated. The patient expressed concern that the cancer may have grown after the MRI, as they had always felt a knot in the area. However, the margins were reported to be clear, which the patient found reassuring.  The patient is currently on Keytruda.  She is fearful that with the residual cancer burden that there could be cancer elsewhere and would like to consider PET scan.  She understands the risk of recurrence to be at a rate of 28-40% in the first few years.   The patient reports no current symptoms and their kidney function, liver function, electrolytes, and blood counts are all normal. They are experiencing some vision changes, which they attribute to the chemotherapy. The patient is managing post-surgical pain with ibuprofen. They express a positive outlook but also acknowledge feelings of  fear and uncertainty.  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   Antineoplastic chemotherapy induced pancytopenia (HCC)  05/02/2023   Neutropenic fever (HCC) 04/30/2023   Neutropenia (HCC) 04/29/2023   Genetic testing 02/08/2023   Port-A-Cath in place 01/28/2023   Malignant neoplasm of lower-inner quadrant of right breast of female, estrogen receptor negative (HCC) 01/17/2023   Axillary mass, right 12/28/2022   Palpitations 11/23/2022   Dizzy spells 03/04/2022   Carpal tunnel syndrome on right 11/02/2017   MVP (mitral valve prolapse) 10/28/2017    is allergic to morphine; tessalon [benzonatate]; hydrocodone; latex; antihistamines, loratadine-type; codeine; and tramadol.  MEDICAL HISTORY: Past Medical History:  Diagnosis Date   Anginal pain (HCC) 02/2019   related to MVP   Back pain    Breast cancer (HCC)    Depression    Dysrhythmia    MVP   Mitral prolapse    Sleep apnea    mild no cpap indicated    SURGICAL HISTORY: Past Surgical History:  Procedure Laterality Date   BREAST SURGERY Right 2024   Breast biopsy x 2   CESAREAN SECTION  03/30/2008   MASTECTOMY WITH AXILLARY LYMPH NODE DISSECTION Bilateral 07/27/2023   Procedure: BILATERAL MASTECTOMY WITH RIGHT AXILLARY LYMPH NODE DISSECTION;  Surgeon: [name], MD;  Location: [facility];  Service: General;  Laterality: Bilateral;  PEC BLOCK   PORTACATH PLACEMENT N/A 01/27/2023   Procedure: INSERTION PORT-A-CATH;  Surgeon: [name], MD;  Location: [facility];  Service: General;  Laterality: N/A;  leaving accessed   ROBOTIC ASSISTED TOTAL HYSTERECTOMY Bilateral 03/15/2019   Procedure: XI ROBOTIC ASSISTED TOTAL HYSTERECTOMY with Bilateral Salpingectomy;  Surgeon: Silverio Lay, MD;  Location: [facility];  Service: Gynecology;  Laterality: Bilateral;   TUBAL LIGATION  2017    SOCIAL HISTORY: Social History   Socioeconomic History   Marital status: Married    Spouse name: Not on file   Number of children: Not on file   Years of education: Not on file   Highest education level: Master's degree (e.g., MA,  MS, MEng, MEd, MSW, MBA)  Occupational History   Not on file  Tobacco Use   Smoking status: Never   Smokeless tobacco: Never  Vaping Use   Vaping status: Never Used  Substance and Sexual Activity   Alcohol use: Not Currently    Comment:     Drug use: No   Sexual activity: Yes    Birth control/protection: Surgical  Other Topics Concern   Not on file  Social History Narrative   Not on file   Social Determinants of Health   Financial Resource Strain: Low Risk  (12/27/2022)   Overall Financial Resource Strain (CARDIA)    Difficulty of Paying Living Expenses: Not very hard  Food Insecurity: No Food Insecurity (04/29/2023)   Hunger Vital Sign    Worried About Running Out of Food in the Last Year: Never true    Ran Out of Food in the Last Year: Never true  Transportation Needs: No Transportation Needs (04/29/2023)   PRAPARE - Administrator, Civil Service (Medical): No    Lack of Transportation (Non-Medical): No  Physical Activity: Insufficiently Active (12/27/2022)   Exercise Vital Sign    Days of Exercise per Week: 4 days    Minutes of Exercise per Session: 20  min  Stress: No Stress Concern Present (12/27/2022)   [employer] of Occupational Health - Occupational Stress Questionnaire    Feeling of Stress : Only a little  Social Connections: Moderately Isolated (12/27/2022)   Social Connection and Isolation Panel [NHANES]    Frequency of Communication with Friends and Family: More than three times a week    Frequency of Social Gatherings with Friends and Family: Once a week    Attends Religious Services: Never    Database administrator or Organizations: No    Attends Engineer, structural: Not on file    Marital Status: Married  Catering manager Violence: Not At Risk (04/29/2023)   Humiliation, Afraid, Rape, and Kick questionnaire    Fear of Current or Ex-Partner: No    Emotionally Abused: No    Physically Abused: No    Sexually Abused: No    FAMILY  HISTORY: Family History  Problem Relation Age of Onset   Stroke Mother    Alzheimer's disease Father    Diabetes Maternal Grandmother    Heart disease Maternal Grandmother    Heart attack Maternal Grandmother    Diabetes Maternal Grandfather    Heart disease Maternal Grandfather    Alzheimer's disease Paternal Grandmother    Dementia Paternal Grandmother    Alzheimer's disease Paternal Grandfather    Dementia Paternal Grandfather    Alcohol abuse Paternal Grandfather     Review of Systems  Constitutional:  Negative for appetite change, chills, fatigue, fever and unexpected weight change.  HENT:   Negative for hearing loss, lump/mass and trouble swallowing.   Eyes:  Negative for eye problems and icterus.  Respiratory:  Negative for chest tightness, cough and shortness of breath.   Cardiovascular:  Negative for chest pain, leg swelling and palpitations.  Gastrointestinal:  Negative for abdominal distention, abdominal pain, constipation, diarrhea, nausea and vomiting.  Endocrine: Negative for hot flashes.  Genitourinary:  Negative for difficulty urinating.   Musculoskeletal:  Negative for arthralgias.  Skin:  Negative for itching and rash.  Neurological:  Negative for dizziness, extremity weakness, headaches and numbness.  Hematological:  Negative for adenopathy. Does not bruise/bleed easily.  Psychiatric/Behavioral:  Negative for depression. The patient is nervous/anxious.       PHYSICAL EXAMINATION    Vitals:   08/05/23 1114  BP: 116/86  Pulse: 95  Resp: 18  Temp: 97.7 F (36.5 C)  SpO2: 100%    Physical Exam Constitutional:      General: She is not in acute distress.    Appearance: Normal appearance. She is not toxic-appearing.  HENT:     Head: Normocephalic and atraumatic.     Mouth/Throat:     Mouth: Mucous membranes are moist.     Pharynx: Oropharynx is clear. No oropharyngeal exudate or posterior oropharyngeal erythema.  Eyes:     General: No scleral  icterus. Cardiovascular:     Rate and Rhythm: Normal rate and regular rhythm.     Pulses: Normal pulses.     Heart sounds: Normal heart sounds.  Pulmonary:     Effort: Pulmonary effort is normal.     Breath sounds: Normal breath sounds.  Chest:     Comments: Status post bilateral mastectomies with Steri-Strips in place, these are healing well.  There are 4 drains in place draining serosanguineous drainage. Abdominal:     General: Abdomen is flat. Bowel sounds are normal. There is no distension.     Palpations: Abdomen is soft.     Tenderness:  There is no abdominal tenderness.  Musculoskeletal:        General: No swelling.     Cervical back: Neck supple.  Lymphadenopathy:     Cervical: No cervical adenopathy.  Skin:    General: Skin is warm and dry.     Findings: No rash.  Neurological:     General: No focal deficit present.     Mental Status: She is alert.  Psychiatric:        Mood and Affect: Mood normal.        Behavior: Behavior normal.     LABORATORY DATA:  CBC    Component Value Date/Time   WBC 5.5 08/05/2023 1058   WBC 6.8 05/06/2023 0527   RBC 3.27 (L) 08/05/2023 1058   HGB 10.6 (L) 08/05/2023 1058   HGB 11.6 04/08/2022 1037   HCT 30.1 (L) 08/05/2023 1058   HCT 35.8 04/08/2022 1037   PLT 231 08/05/2023 1058   PLT 244 04/08/2022 1037   MCV 92.0 08/05/2023 1058   MCV 89 04/08/2022 1037   MCH 32.4 08/05/2023 1058   MCHC 35.2 08/05/2023 1058   RDW 12.7 08/05/2023 1058   RDW 12.1 04/08/2022 1037   LYMPHSABS 0.8 08/05/2023 1058   LYMPHSABS 1.3 04/08/2022 1037   MONOABS 0.4 08/05/2023 1058   EOSABS 1.5 (H) 08/05/2023 1058   EOSABS 0.1 04/08/2022 1037   BASOSABS 0.0 08/05/2023 1058   BASOSABS 0.0 04/08/2022 1037    CMP     Component Value Date/Time   NA 140 08/05/2023 1058   NA 140 04/08/2022 1037   K 4.1 08/05/2023 1058   CL 106 08/05/2023 1058   CO2 27 08/05/2023 1058   GLUCOSE 87 08/05/2023 1058   BUN 6 08/05/2023 1058   BUN 9 04/08/2022 1037    CREATININE 0.60 08/05/2023 1058   CALCIUM 9.7 08/05/2023 1058   PROT 7.4 08/05/2023 1058   PROT 6.5 04/08/2022 1037   ALBUMIN 4.2 08/05/2023 1058   ALBUMIN 4.3 04/08/2022 1037   AST 15 08/05/2023 1058   ALT 9 08/05/2023 1058   ALKPHOS 78 08/05/2023 1058   BILITOT 0.4 08/05/2023 1058   GFRNONAA >60 08/05/2023 1058   GFRAA 132 11/04/2020 1208      ASSESSMENT and THERAPY PLAN:   Malignant neoplasm of lower-inner quadrant of right breast of female, estrogen receptor negative (HCC) /05/2023:Palpable lump right axilla x 2 weeks bulky axillary lymphadenopathy ultrasound breast: 2 masses at 4 o'clock position 1.1 cm and 0.6 cm multiple axillary lymph nodes largest 4.5 cm also level 2 and 3 (subpectoral lymph node as well): Biopsy: Grade 3 IDC ER 0%, PR 0%, Ki-67 95%, HER2 1+  01/25/2023: PET/CT: Right breast cancer, right axilla, retropectoral and right cervical lymph nodes   Treatment plan: Neoadjuvant chemotherapy with Taxol carbo Keytruda weekly x 12 followed by Adriamycin Cytoxan Keytruda followed by Rande Lawman maintenance 07/27/2023: Bilateral mastectomies: Left mastectomy: Benign, right mastectomy: No evidence of residual cancer, 8/20 lymph nodes positive with extranodal extension and 2 lymph nodes Adjuvant radiation therapy -------------------------------------------------------------------------------------------------------- Pathology counseling: I discussed pathology report and the fact that she had 8/20 + LN following neoadjuvant treatment, and again triple negative.  Breast Cancer Post-surgical patient with grade 3 cancer cells and 8/20 lymph nodes positive for cancer. Clear surgical margins. Concerns about potential residual disease and risk of recurrence. -Order PET scan within the next two weeks to assess for residual and/or metastatic disease. -Continue Keytruda. -Consider adding capecitabine (oral chemotherapy) post-radiation to reduce risk of recurrence. -Not  a candidate for  ASCENT-05 trial  Post-Surgical Recovery Patient reports good pain control with ibuprofen 400mg  and normal bowel movements. -Continue current pain management regimen. -Follow-up every 3 weeks for labs, evaluation, and Keytruda.    All questions were answered. The patient knows to call the clinic with any problems, questions or concerns. We can certainly see the patient much sooner if necessary.  Total encounter time:30 minutes*in face-to-face visit time, chart review, lab review, care coordination, order entry, and documentation of the encounter time.    Lillard Anes, NP 08/05/23 12:18 PM Medical Oncology and Hematology [facility] [address] [place], [place] [address] Tel. [phone]    Fax. [phone]  *Total Encounter Time as defined by the Centers for Medicare and Medicaid Services includes, in addition to the face-to-face time of a patient visit (documented in the note above) non-face-to-face time: obtaining and reviewing outside history, ordering and reviewing medications, tests or procedures, care coordination (communications with other health care professionals or caregivers) and documentation in the medical record.

## 2023-08-05 NOTE — Assessment & Plan Note (Signed)
/  05/2023:Palpable lump right axilla x 2 weeks bulky axillary lymphadenopathy ultrasound breast: 2 masses at 4 o'clock position 1.1 cm and 0.6 cm multiple axillary lymph nodes largest 4.5 cm also level 2 and 3 (subpectoral lymph node as well): Biopsy: Grade 3 IDC ER 0%, PR 0%, Ki-67 95%, HER2 1+  01/25/2023: PET/CT: Right breast cancer, right axilla, retropectoral and right cervical lymph nodes   Treatment plan: Neoadjuvant chemotherapy with Taxol carbo Keytruda weekly x 12 followed by Adriamycin Cytoxan Keytruda followed by Rande Lawman maintenance 07/27/2023: Bilateral mastectomies: Left mastectomy: Benign, right mastectomy: No evidence of residual cancer, 8/20 lymph nodes positive with extranodal extension and 2 lymph nodes Adjuvant radiation therapy -------------------------------------------------------------------------------------------------------- Pathology counseling: I discussed pathology report and the fact that she had 8/20 + LN following neoadjuvant treatment, and again triple negative.  Breast Cancer Post-surgical patient with grade 3 cancer cells and 8/20 lymph nodes positive for cancer. Clear surgical margins. Concerns about potential residual disease and risk of recurrence. -Order PET scan within the next two weeks to assess for residual and/or metastatic disease. -Continue Keytruda. -Consider adding capecitabine (oral chemotherapy) post-radiation to reduce risk of recurrence. -Not a candidate for ASCENT-05 trial  Post-Surgical Recovery Patient reports good pain control with ibuprofen 400mg  and normal bowel movements. -Continue current pain management regimen. -Follow-up every 3 weeks for labs, evaluation, and Keytruda.

## 2023-08-08 ENCOUNTER — Encounter: Payer: Self-pay | Admitting: *Deleted

## 2023-08-08 ENCOUNTER — Telehealth: Payer: Self-pay

## 2023-08-08 NOTE — Telephone Encounter (Signed)
S/w pt regarding concerns. She is scheduled 08/17/23 for PET scan and MD f/u 08/26/23. Dr [name] is not concerned for eosinophils and states this could be related to sx. She verbalized thanks and understanding.

## 2023-08-09 ENCOUNTER — Encounter (HOSPITAL_COMMUNITY): Payer: Self-pay

## 2023-08-09 ENCOUNTER — Other Ambulatory Visit: Payer: Self-pay

## 2023-08-09 ENCOUNTER — Emergency Department (HOSPITAL_COMMUNITY)
Admission: EM | Admit: 2023-08-09 | Discharge: 2023-08-10 | Disposition: A | Discharge status: Home / Self Care | Payer: Managed Care, Other (non HMO) | Attending: Emergency Medicine | Admitting: Emergency Medicine

## 2023-08-09 DIAGNOSIS — Z91040 Latex allergy status: Secondary | ICD-10-CM | POA: Insufficient documentation

## 2023-08-09 DIAGNOSIS — T8140XA Infection following a procedure, unspecified, initial encounter: Secondary | ICD-10-CM | POA: Insufficient documentation

## 2023-08-09 DIAGNOSIS — Z853 Personal history of malignant neoplasm of breast: Secondary | ICD-10-CM | POA: Insufficient documentation

## 2023-08-09 DIAGNOSIS — D72829 Elevated white blood cell count, unspecified: Secondary | ICD-10-CM | POA: Insufficient documentation

## 2023-08-09 NOTE — ED Triage Notes (Addendum)
Pt reports having a fever of 101 F oral at home tonight. Pt had a double mastectomy 2 weeks ago and her had drains removed today. Pt took Tylenol 1000 mg at 10 pm.

## 2023-08-10 ENCOUNTER — Observation Stay (HOSPITAL_COMMUNITY): Payer: Managed Care, Other (non HMO)

## 2023-08-10 ENCOUNTER — Encounter (HOSPITAL_COMMUNITY): Payer: Self-pay

## 2023-08-10 ENCOUNTER — Other Ambulatory Visit: Payer: Self-pay

## 2023-08-10 ENCOUNTER — Inpatient Hospital Stay (HOSPITAL_COMMUNITY)
Admission: EM | Admit: 2023-08-10 | Discharge: 2023-08-12 | DRG: 863 | Disposition: A | Discharge status: Home / Self Care | Payer: Managed Care, Other (non HMO) | Attending: General Surgery | Admitting: General Surgery

## 2023-08-10 ENCOUNTER — Emergency Department (HOSPITAL_COMMUNITY): Payer: Managed Care, Other (non HMO)

## 2023-08-10 DIAGNOSIS — Z87892 Personal history of anaphylaxis: Secondary | ICD-10-CM

## 2023-08-10 DIAGNOSIS — Y838 Other surgical procedures as the cause of abnormal reaction of the patient, or of later complication, without mention of misadventure at the time of the procedure: Secondary | ICD-10-CM | POA: Diagnosis present

## 2023-08-10 DIAGNOSIS — Z9221 Personal history of antineoplastic chemotherapy: Secondary | ICD-10-CM

## 2023-08-10 DIAGNOSIS — Z833 Family history of diabetes mellitus: Secondary | ICD-10-CM

## 2023-08-10 DIAGNOSIS — I341 Nonrheumatic mitral (valve) prolapse: Secondary | ICD-10-CM | POA: Diagnosis present

## 2023-08-10 DIAGNOSIS — Z823 Family history of stroke: Secondary | ICD-10-CM

## 2023-08-10 DIAGNOSIS — Z8249 Family history of ischemic heart disease and other diseases of the circulatory system: Secondary | ICD-10-CM

## 2023-08-10 DIAGNOSIS — T8149XA Infection following a procedure, other surgical site, initial encounter: Principal | ICD-10-CM | POA: Diagnosis present

## 2023-08-10 DIAGNOSIS — Z888 Allergy status to other drugs, medicaments and biological substances status: Secondary | ICD-10-CM

## 2023-08-10 DIAGNOSIS — Z91048 Other nonmedicinal substance allergy status: Secondary | ICD-10-CM

## 2023-08-10 DIAGNOSIS — T8140XA Infection following a procedure, unspecified, initial encounter: Principal | ICD-10-CM | POA: Diagnosis present

## 2023-08-10 DIAGNOSIS — D849 Immunodeficiency, unspecified: Secondary | ICD-10-CM | POA: Diagnosis present

## 2023-08-10 DIAGNOSIS — Z9013 Acquired absence of bilateral breasts and nipples: Secondary | ICD-10-CM

## 2023-08-10 DIAGNOSIS — Z885 Allergy status to narcotic agent status: Secondary | ICD-10-CM

## 2023-08-10 DIAGNOSIS — Z79620 Long term (current) use of immunosuppressive biologic: Secondary | ICD-10-CM

## 2023-08-10 DIAGNOSIS — Z820 Family history of epilepsy and other diseases of the nervous system: Secondary | ICD-10-CM

## 2023-08-10 DIAGNOSIS — Z90710 Acquired absence of both cervix and uterus: Secondary | ICD-10-CM

## 2023-08-10 DIAGNOSIS — C50911 Malignant neoplasm of unspecified site of right female breast: Secondary | ICD-10-CM | POA: Diagnosis present

## 2023-08-10 DIAGNOSIS — Z91040 Latex allergy status: Secondary | ICD-10-CM

## 2023-08-10 DIAGNOSIS — D649 Anemia, unspecified: Secondary | ICD-10-CM | POA: Diagnosis present

## 2023-08-10 DIAGNOSIS — R6510 Systemic inflammatory response syndrome (SIRS) of non-infectious origin without acute organ dysfunction: Secondary | ICD-10-CM | POA: Diagnosis present

## 2023-08-10 DIAGNOSIS — Z9109 Other allergy status, other than to drugs and biological substances: Secondary | ICD-10-CM

## 2023-08-10 LAB — CBC WITH DIFFERENTIAL/PLATELET
Abs Immature Granulocytes: 0.07 10*3/uL (ref 0.00–0.07)
Abs Immature Granulocytes: 0.05 10*3/uL (ref 0.00–0.07)
Basophils Absolute: 0 10*3/uL (ref 0.0–0.1)
Basophils Absolute: 0 10*3/uL (ref 0.0–0.1)
Basophils Relative: 0 %
Basophils Relative: 0 %
Eosinophils Absolute: 0.6 10*3/uL — ABNORMAL HIGH (ref 0.0–0.5)
Eosinophils Absolute: 0.4 10*3/uL (ref 0.0–0.5)
Eosinophils Relative: 4 %
Eosinophils Relative: 4 %
HCT: 32.5 % — ABNORMAL LOW (ref 36.0–46.0)
HCT: 30.8 % — ABNORMAL LOW (ref 36.0–46.0)
Hemoglobin: 10.6 g/dL — ABNORMAL LOW (ref 12.0–15.0)
Hemoglobin: 10.1 g/dL — ABNORMAL LOW (ref 12.0–15.0)
Immature Granulocytes: 1 %
Immature Granulocytes: 1 %
Lymphocytes Relative: 6 %
Lymphocytes Relative: 7 %
Lymphs Abs: 0.8 10*3/uL (ref 0.7–4.0)
Lymphs Abs: 0.7 10*3/uL (ref 0.7–4.0)
MCH: 30.9 pg (ref 26.0–34.0)
MCH: 31 pg (ref 26.0–34.0)
MCHC: 32.6 g/dL (ref 30.0–36.0)
MCHC: 32.8 g/dL (ref 30.0–36.0)
MCV: 94.8 fL (ref 80.0–100.0)
MCV: 94.5 fL (ref 80.0–100.0)
Monocytes Absolute: 0.6 10*3/uL (ref 0.1–1.0)
Monocytes Absolute: 0.6 10*3/uL (ref 0.1–1.0)
Monocytes Relative: 5 %
Monocytes Relative: 5 %
Neutro Abs: 10.6 10*3/uL — ABNORMAL HIGH (ref 1.7–7.7)
Neutro Abs: 9.1 10*3/uL — ABNORMAL HIGH (ref 1.7–7.7)
Neutrophils Relative %: 84 %
Neutrophils Relative %: 83 %
Platelets: 253 10*3/uL (ref 150–400)
Platelets: 292 10*3/uL (ref 150–400)
RBC: 3.43 MIL/uL — ABNORMAL LOW (ref 3.87–5.11)
RBC: 3.26 MIL/uL — ABNORMAL LOW (ref 3.87–5.11)
RDW: 12.6 % (ref 11.5–15.5)
RDW: 12.6 % (ref 11.5–15.5)
WBC: 12.5 10*3/uL — ABNORMAL HIGH (ref 4.0–10.5)
WBC: 10.9 10*3/uL — ABNORMAL HIGH (ref 4.0–10.5)
nRBC: 0 % (ref 0.0–0.2)
nRBC: 0 % (ref 0.0–0.2)

## 2023-08-10 LAB — COMPREHENSIVE METABOLIC PANEL
ALT: 15 U/L (ref 0–44)
ALT: 12 U/L (ref 0–44)
AST: 18 U/L (ref 15–41)
AST: 16 U/L (ref 15–41)
Albumin: 4 g/dL (ref 3.5–5.0)
Albumin: 3.8 g/dL (ref 3.5–5.0)
Alkaline Phosphatase: 70 U/L (ref 38–126)
Alkaline Phosphatase: 67 U/L (ref 38–126)
Anion gap: 8 (ref 5–15)
Anion gap: 9 (ref 5–15)
BUN: 13 mg/dL (ref 6–20)
BUN: 9 mg/dL (ref 6–20)
CO2: 25 mmol/L (ref 22–32)
CO2: 24 mmol/L (ref 22–32)
Calcium: 9.3 mg/dL (ref 8.9–10.3)
Calcium: 9.2 mg/dL (ref 8.9–10.3)
Chloride: 103 mmol/L (ref 98–111)
Chloride: 104 mmol/L (ref 98–111)
Creatinine, Ser: 0.55 mg/dL (ref 0.44–1.00)
Creatinine, Ser: 0.62 mg/dL (ref 0.44–1.00)
GFR, Estimated: >60 mL/min (ref >60)
GFR, Estimated: >60 mL/min (ref >60)
Glucose, Bld: 105 mg/dL — ABNORMAL HIGH (ref 70–99)
Glucose, Bld: 111 mg/dL — ABNORMAL HIGH (ref 70–99)
Potassium: 3.3 mmol/L — ABNORMAL LOW (ref 3.5–5.1)
Potassium: 3.6 mmol/L (ref 3.5–5.1)
Sodium: 136 mmol/L (ref 135–145)
Sodium: 137 mmol/L (ref 135–145)
Total Bilirubin: 0.6 mg/dL (ref <1.2)
Total Bilirubin: 0.6 mg/dL (ref <1.2)
Total Protein: 7.5 g/dL (ref 6.5–8.1)
Total Protein: 7 g/dL (ref 6.5–8.1)

## 2023-08-10 LAB — URINALYSIS, W/ REFLEX TO CULTURE (INFECTION SUSPECTED)
Bacteria, UA: NONE SEEN
Bilirubin Urine: NEGATIVE
Glucose, UA: NEGATIVE mg/dL
Hgb urine dipstick: NEGATIVE
Ketones, ur: NEGATIVE mg/dL
Leukocytes,Ua: NEGATIVE
Nitrite: NEGATIVE
Protein, ur: NEGATIVE mg/dL
Specific Gravity, Urine: 1.004 — ABNORMAL LOW (ref 1.005–1.030)
pH: 7 (ref 5.0–8.0)

## 2023-08-10 LAB — PROTIME-INR
INR: 1.2 (ref 0.8–1.2)
Prothrombin Time: 15.3 s — ABNORMAL HIGH (ref 11.4–15.2)

## 2023-08-10 LAB — APTT: aPTT: 39 s — ABNORMAL HIGH (ref 24–36)

## 2023-08-10 LAB — I-STAT CG4 LACTIC ACID, ED
Lactic Acid, Venous: 0.6 mmol/L (ref 0.5–1.9)
Lactic Acid, Venous: 0.5 mmol/L (ref 0.5–1.9)

## 2023-08-10 MED ORDER — MELATONIN 3 MG PO TABS: 3.0000 mg | ORAL_TABLET | Freq: Every evening | ORAL | Status: DC | PRN

## 2023-08-10 MED ORDER — VANCOMYCIN HCL 750 MG/150ML IV SOLN
750.0000 mg | Freq: Two times a day (BID) | INTRAVENOUS | Status: DC
  Administered 2023-08-11 (×2): 750 mg via INTRAVENOUS
  Filled 2023-08-10 (×3): qty 150

## 2023-08-10 MED ORDER — DOXYCYCLINE HYCLATE 100 MG PO TABS
100.0000 mg | ORAL_TABLET | Freq: Once | ORAL | Status: AC
  Administered 2023-08-10: 100 mg via ORAL
  Filled 2023-08-10: qty 1

## 2023-08-10 MED ORDER — PIPERACILLIN-TAZOBACTAM 3.375 G IVPB
3.3750 g | Freq: Three times a day (TID) | INTRAVENOUS | Status: DC
  Administered 2023-08-11 – 2023-08-12 (×4): 3.375 g via INTRAVENOUS
  Filled 2023-08-10 (×4): qty 50

## 2023-08-10 MED ORDER — ONDANSETRON HCL 4 MG/2ML IJ SOLN: 4.0000 mg | Freq: Four times a day (QID) | INTRAMUSCULAR | Status: DC | PRN

## 2023-08-10 MED ORDER — DOXYCYCLINE HYCLATE 100 MG PO CAPS: 100.0000 mg | ORAL_CAPSULE | Freq: Two times a day (BID) | ORAL | 0 refills | Status: DC

## 2023-08-10 MED ORDER — SIMETHICONE 80 MG PO CHEW: 40.0000 mg | CHEWABLE_TABLET | Freq: Four times a day (QID) | ORAL | Status: DC | PRN

## 2023-08-10 MED ORDER — PIPERACILLIN-TAZOBACTAM 3.375 G IVPB 30 MIN
3.3750 g | INTRAVENOUS | Status: AC
Start: 2023-08-10 — End: 2023-08-10
  Administered 2023-08-10: 3.375 g via INTRAVENOUS
  Filled 2023-08-10: qty 50

## 2023-08-10 MED ORDER — ACETAMINOPHEN 325 MG PO TABS
650.0000 mg | ORAL_TABLET | Freq: Four times a day (QID) | ORAL | Status: DC | PRN
  Administered 2023-08-10 – 2023-08-11 (×3): 650 mg via ORAL
  Filled 2023-08-10 (×3): qty 2

## 2023-08-10 MED ORDER — METHOCARBAMOL 1000 MG/10ML IJ SOLN: 500.0000 mg | Freq: Three times a day (TID) | INTRAMUSCULAR | Status: DC | PRN

## 2023-08-10 MED ORDER — ONDANSETRON 4 MG PO TBDP: 4.0000 mg | ORAL_TABLET | Freq: Four times a day (QID) | ORAL | Status: DC | PRN

## 2023-08-10 MED ORDER — ENOXAPARIN SODIUM 40 MG/0.4ML IJ SOSY
40.0000 mg | PREFILLED_SYRINGE | Freq: Every day | INTRAMUSCULAR | Status: DC
  Administered 2023-08-10 – 2023-08-11 (×2): 40 mg via SUBCUTANEOUS
  Filled 2023-08-10 (×2): qty 0.4

## 2023-08-10 MED ORDER — METHOCARBAMOL 500 MG PO TABS: 500.0000 mg | ORAL_TABLET | Freq: Three times a day (TID) | ORAL | Status: DC | PRN

## 2023-08-10 MED ORDER — DEXTROSE-SODIUM CHLORIDE 5-0.45 % IV SOLN: INTRAVENOUS | Status: AC

## 2023-08-10 MED ORDER — VANCOMYCIN HCL 1250 MG/250ML IV SOLN
1250.0000 mg | INTRAVENOUS | Status: AC
  Administered 2023-08-10: 1250 mg via INTRAVENOUS
  Filled 2023-08-10: qty 250

## 2023-08-10 MED ORDER — ACETAMINOPHEN 650 MG RE SUPP: 650.0000 mg | Freq: Four times a day (QID) | RECTAL | Status: DC | PRN

## 2023-08-10 NOTE — ED Provider Notes (Signed)
[place] EMERGENCY DEPARTMENT AT [facility] Provider Note   CSN: [id number] Arrival date & time: 08/09/23  2334     History  Chief Complaint  Patient presents with   Fever    Suzanne Tucker is a 42 y.o. female.  The history is provided by the patient.  Patient with history of breast cancer presents with fever.  Patient underwent double mastectomy around October 23.  She has done well postoperatively.  She remove the left drain about 3 days ago.  The right drain was removed earlier in the day, now spiked a fever. No vomiting or diarrhea.  No cough or shortness of breath.  No headache or neck pain. She does not have any other complaints.  She is currently on Keytruda for immunotherapy     Home Medications Prior to Admission medications   Medication Sig Start Date End Date Taking? Authorizing Provider  acetaminophen (TYLENOL) 500 MG tablet Take 1,000 mg by mouth every 6 (six) hours as needed for mild pain or moderate pain.   Yes [provider]  calcium carbonate (TUMS - DOSED IN MG ELEMENTAL CALCIUM) 500 MG chewable tablet Chew 1,000 mg by mouth as needed for indigestion or heartburn.   Yes [provider]  doxycycline (VIBRAMYCIN) 100 MG capsule Take 1 capsule (100 mg total) by mouth 2 (two) times daily. One po bid x 7 days 08/10/23  Yes Zadie Rhine, MD  fluticasone [facility]) 50 MCG/ACT nasal spray Place 2 sprays into both nostrils daily. Patient taking differently: Place 2 sprays into both nostrils daily as needed for allergies or rhinitis. 12/02/22  Yes Leath-Warren, Sadie Haber, NP  ibuprofen (ADVIL) 600 MG tablet Take 1 tablet (600 mg total) by mouth every 6 (six) hours as needed for mild pain (pain score 1-3), moderate pain (pain score 4-6) or headache. 07/28/23  Yes [name], MD  methocarbamol (ROBAXIN) 500 MG tablet Take 1 tablet (500 mg total) by mouth every 6 (six) hours as needed for muscle spasms. Patient not taking:  Reported on 08/10/2023 07/28/23   [name], MD  Multiple Vitamin (MULTI-VITAMIN) tablet Take 1 tablet by mouth daily. Patient not taking: Reported on 08/10/2023    [provider]  ondansetron (ZOFRAN) 8 MG tablet Take 1 tablet (8 mg total) by mouth every 8 (eight) hours as needed for nausea or vomiting. Start on the third day after chemotherapy. Patient not taking: Reported on 08/10/2023 01/19/23   Serena Croissant, MD      Allergies    Morphine; Tessalon [benzonatate]; Hydrocodone; Latex; Antihistamines, loratadine-type; Codeine; and Tramadol    Review of Systems   Review of Systems  Constitutional:  Positive for fever.  Respiratory:  Negative for cough and shortness of breath.   Cardiovascular:  Negative for chest pain.  Gastrointestinal:  Negative for abdominal pain, diarrhea and vomiting.  Genitourinary:  Negative for dysuria.  Skin:  Negative for rash.  Neurological:  Negative for headaches.    Physical Exam Updated Vital Signs BP 112/71 (BP Location: Left Arm)   Pulse 89   Temp 99.3 F (37.4 C)   Resp 17   SpO2 100%  Physical Exam CONSTITUTIONAL: Chronically ill-appearing, no acute distress HEAD: Normocephalic/atraumatic EYES: EOMI/PERRL ENMT: Mucous membranes moist, uvula midline no erythema or exudate NECK: supple no meningeal signs SPINE/BACK:entire spine nontender CV: S1/S2 noted, tachycardia LUNGS: Lungs are clear to auscultation bilaterally, no apparent distress ABDOMEN: soft, nontender, no rebound or guarding, bowel sounds noted throughout abdomen GU:no cva tenderness NEURO:  Pt is awake/alert/appropriate, moves all extremitiesx4.  No facial droop.   EXTREMITIES: pulses normal/equal, full ROM SKIN: See photos Active pus is draining from the wound on the right chest No crepitus, minimal erythema noted PSYCH: Anxious         ED Results / Procedures / Treatments   Labs (all labs ordered are listed, but only abnormal results are displayed) Labs  Reviewed  COMPREHENSIVE METABOLIC PANEL - Abnormal; Notable for the following components:      Result Value   Potassium 3.3 (*)    Glucose, Bld 105 (*)    All other components within normal limits  CBC WITH DIFFERENTIAL/PLATELET - Abnormal; Notable for the following components:   WBC 12.5 (*)    RBC 3.43 (*)    Hemoglobin 10.6 (*)    HCT 32.5 (*)    Neutro Abs 10.6 (*)    Eosinophils Absolute 0.6 (*)    All other components within normal limits  CULTURE, BLOOD (ROUTINE X 2)  CULTURE, BLOOD (ROUTINE X 2)  I-STAT CG4 LACTIC ACID, ED    EKG EKG Interpretation Date/Time:  Wednesday August 10 2023 01:04:10 EST Ventricular Rate:  97 PR Interval:  125 QRS Duration:  87 QT Interval:  371 QTC Calculation: 472 R Axis:   83  Text Interpretation: Sinus rhythm Borderline T abnormalities, anterior leads Confirmed by Zadie Rhine ([id number]) on 08/10/2023 1:09:55 AM  Radiology No results found.  Procedures Procedures    Medications Ordered in ED Medications  doxycycline (VIBRA-TABS) tablet 100 mg (has no administration in time range)    ED Course/ Medical Decision Making/ A&P Clinical Course as of 08/10/23 0142  Wed Aug 10, 2023  0023 Patient overall well-appearing but did have fever up to 101 at home is tachycardic.  She has drainage from the area where the drain was removed.  Strong suspicion this is the source of the fever.  Labs have been ordered [DW]  0113 Discussed the case with on-call surgery Dr. Royanne Foots He will admit and start IV antibiotics [DW]  0140 After discussion with patient and Dr. Merian Capron who has reviewed the labs and imaging, he feels patient can go home if she is feeling improved.  The wound is still draining, but overall patient feels much improved.  She is in no acute distress, no vomiting. Patient was given the option of admission versus discharge with close follow-up.  Patient prefers to be discharged and will start oral antibiotics  Will start  doxycycline and she will call her surgeon in the morning.  [DW]    Clinical Course User Index [DW] Zadie Rhine, MD                                 Medical Decision Making Amount and/or Complexity of Data Reviewed Labs: ordered. ECG/medicine tests: ordered.  Risk Prescription drug management.   This patient presents to the ED for concern of fever, this involves an extensive number of treatment options, and is a complaint that carries with it a high risk of complications and morbidity.  The differential diagnosis includes but is not limited to sepsis, pneumonia, UTI, meningitis, bacteremia, postop infection  Comorbidities that complicate the patient evaluation: Patient's presentation is complicated by their history of breast cancer  Additional history obtained: Records reviewed Care Everywhere/External Records  Lab Tests: I Ordered, and personally interpreted labs.  The pertinent results include: Leukocytosis   Critical Interventions:   antibiotics  Consultations Obtained: I requested consultation with the consultant General Surgery , and discussed  findings as well as pertinent plan - they recommend: Antibiotics and discharge patient improved  Reevaluation: After the interventions noted above, I reevaluated the patient and found that they have :improved  Complexity of problems addressed: Patient's presentation is most consistent with  acute presentation with potential threat to life or bodily function  Disposition: After consideration of the diagnostic results and the patient's response to treatment,  I feel that the patent would benefit from discharge   .           Final Clinical Impression(s) / ED Diagnoses Final diagnoses:  Postoperative infection, unspecified type, initial encounter    Rx / DC Orders ED Discharge Orders          Ordered    doxycycline (VIBRAMYCIN) 100 MG capsule  2 times daily        08/10/23 0142              Zadie Rhine, MD 08/10/23 [phone]

## 2023-08-10 NOTE — ED Notes (Signed)
ED TO INPATIENT HANDOFF REPORT  ED Nurse Name and Phone #: Jolinda Croak Name/Age/Gender Suzanne Tucker 42 y.o. female Room/Bed: WA15/WA15 Code Status   Code Status: Full Code  Home/SNF/Other Home Patient oriented to: self, place, time, and situation Is this baseline? Yes   Triage Complete: Triage complete  Chief Complaint Complication, postoperative infection [T81.40XA]  Triage Note C/o Fever with temp max 100.46F.  Hx of double mastectomy 2 weeks age with drain placed.  Pt reports seen yesterday for same and was placed on abx.  Tylenol @ 1500.     Allergies Allergies  Allergen Reactions   Morphine Anaphylaxis and Shortness Of Breath   Tape Other (See Comments)    NEEDS DRESSINGS FOR SENSITIVE SKIN!!!   Tessalon [Benzonatate] Hives, Itching and Other (See Comments)    Hives, itching, burning all over.    Hydrocodone Hives   Latex Rash   Antihistamines, Loratadine-Type Other (See Comments)    Patient reports numbness in tongue   Codeine Nausea And Vomiting   Kiwi Extract Swelling and Other (See Comments)    Mouth tingles, too   Oxycontin [Oxycodone] Other (See Comments)    RPh has recommended this not be taken  (Oxycontin)   Tramadol Other (See Comments)    Tingling in the mouth with swelling     Level of Care/Admitting Diagnosis ED Disposition     ED Disposition  Admit   Condition  --   Comment  Hospital Area: [facility] [[id number]]  Level of Care: Med-Surg [16]  May place patient in observation at [facility] or [facility] if equivalent level of care is available:: No  Covid Evaluation: Asymptomatic - no recent exposure (last 10 days) testing not required  Diagnosis: Complication, postoperative infection [[id number]]  Admitting Physician: [name] [[id number]]  Attending Physician: [name] [[id number]]          B Medical/Surgery History Past Medical History:  Diagnosis Date   Anginal pain (HCC) 02/2019   related to MVP    Back pain    Breast cancer (HCC)    Depression    Dysrhythmia    MVP   Mitral prolapse    Sleep apnea    mild no cpap indicated   Past Surgical History:  Procedure Laterality Date   BREAST SURGERY Right 2024   Breast biopsy x 2   CESAREAN SECTION  03/30/2008   MASTECTOMY WITH AXILLARY LYMPH NODE DISSECTION Bilateral 07/27/2023   Procedure: BILATERAL MASTECTOMY WITH RIGHT AXILLARY LYMPH NODE DISSECTION;  Surgeon: [name], MD;  Location: [facility];  Service: General;  Laterality: Bilateral;  PEC BLOCK   PORTACATH PLACEMENT N/A 01/27/2023   Procedure: INSERTION PORT-A-CATH;  Surgeon: [name], MD;  Location: [facility];  Service: General;  Laterality: N/A;  leaving accessed   ROBOTIC ASSISTED TOTAL HYSTERECTOMY Bilateral 03/15/2019   Procedure: XI ROBOTIC ASSISTED TOTAL HYSTERECTOMY with Bilateral Salpingectomy;  Surgeon: Silverio Lay, MD;  Location: [facility];  Service: Gynecology;  Laterality: Bilateral;   TUBAL LIGATION  2017     A IV Location/Drains/Wounds Patient Lines/Drains/Airways Status     Active Line/Drains/Airways     Name Placement date Placement time Site Days   Implanted Port 01/27/23 Left Chest 01/27/23  --  Chest  195   Peripheral IV 08/10/23 20 G 1.88" Anterior;Left Forearm 08/10/23  1808  Forearm  less than 1   Closed System Drain 1 Left Breast Bulb (JP) 19 Fr. 07/27/23  1105  Breast  14   Closed System Drain 3 Inferior;Lateral;Right Breast Bulb (JP) 19 Fr. 07/27/23  1222  Breast  14   Closed System Drain 2 Lateral;Right Breast Bulb (JP) 07/27/23  1223  Breast  14            Intake/Output Last 24 hours No intake or output data in the 24 hours ending 08/10/23 2025  Labs/Imaging Results for orders placed or performed during the hospital encounter of 08/10/23 (from the past 48 hour(s))  Urinalysis, w/ Reflex to Culture (Infection Suspected) -Urine, Clean Catch     Status: Abnormal   Collection Time: 08/10/23   5:18 PM  Result Value Ref Range   Specimen Source URINE, CLEAN CATCH    Color, Urine COLORLESS (A) YELLOW   APPearance CLEAR CLEAR   Specific Gravity, Urine 1.004 (L) 1.005 - 1.030   pH 7.0 5.0 - 8.0   Glucose, UA NEGATIVE NEGATIVE mg/dL   Hgb urine dipstick NEGATIVE NEGATIVE   Bilirubin Urine NEGATIVE NEGATIVE   Ketones, ur NEGATIVE NEGATIVE mg/dL   Protein, ur NEGATIVE NEGATIVE mg/dL   Nitrite NEGATIVE NEGATIVE   Leukocytes,Ua NEGATIVE NEGATIVE   RBC / HPF 0-5 0 - 5 RBC/hpf   WBC, UA 0-5 0 - 5 WBC/hpf    Comment:        Reflex urine culture not performed if WBC <=10, OR if Squamous epithelial cells >5. If Squamous epithelial cells >5 suggest recollection.    Bacteria, UA NONE SEEN NONE SEEN   Squamous Epithelial / HPF 0-5 0 - 5 /HPF    Comment: Performed at [facility], [address] [place], [place] [address]  Comprehensive metabolic panel     Status: Abnormal   Collection Time: 08/10/23  6:05 PM  Result Value Ref Range   Sodium 137 135 - 145 mmol/L   Potassium 3.6 3.5 - 5.1 mmol/L   Chloride 104 98 - 111 mmol/L   CO2 24 22 - 32 mmol/L   Glucose, Bld 111 (H) 70 - 99 mg/dL    Comment: Glucose reference range applies only to samples taken after fasting for at least 8 hours.   BUN 9 6 - 20 mg/dL   Creatinine, Ser 9.56 0.44 - 1.00 mg/dL   Calcium 9.2 8.9 - 21.3 mg/dL   Total Protein 7.0 6.5 - 8.1 g/dL   Albumin 3.8 3.5 - 5.0 g/dL   AST 16 15 - 41 U/L   ALT 12 0 - 44 U/L   Alkaline Phosphatase 67 38 - 126 U/L   Total Bilirubin 0.6 <1.2 mg/dL   GFR, Estimated >08 >65 mL/min    Comment: (NOTE) Calculated using the CKD-EPI Creatinine Equation (2021)    Anion gap 9 5 - 15    Comment: Performed at [facility], [address], [place], [place] [address]  CBC with Differential     Status: Abnormal   Collection Time: 08/10/23  6:05 PM  Result Value Ref Range   WBC 10.9 (H) 4.0 - 10.5 K/uL   RBC 3.26 (L) 3.87 - 5.11 MIL/uL    Hemoglobin 10.1 (L) 12.0 - 15.0 g/dL   HCT 62.9 (L) 52.8 - 41.3 %   MCV 94.5 80.0 - 100.0 fL   MCH 31.0 26.0 - 34.0 pg   MCHC 32.8 30.0 - 36.0 g/dL   RDW 24.4 01.0 - 27.2 %   Platelets 292 150 - 400 K/uL   nRBC 0.0 0.0 - 0.2 %   Neutrophils Relative % 83 %  Neutro Abs 9.1 (H) 1.7 - 7.7 K/uL   Lymphocytes Relative 7 %   Lymphs Abs 0.7 0.7 - 4.0 K/uL   Monocytes Relative 5 %   Monocytes Absolute 0.6 0.1 - 1.0 K/uL   Eosinophils Relative 4 %   Eosinophils Absolute 0.4 0.0 - 0.5 K/uL   Basophils Relative 0 %   Basophils Absolute 0.0 0.0 - 0.1 K/uL   Immature Granulocytes 1 %   Abs Immature Granulocytes 0.05 0.00 - 0.07 K/uL    Comment: Performed at [facility], [address], [place], [place] [address]  Protime-INR     Status: Abnormal   Collection Time: 08/10/23  6:05 PM  Result Value Ref Range   Prothrombin Time 15.3 (H) 11.4 - 15.2 seconds   INR 1.2 0.8 - 1.2    Comment: (NOTE) INR goal varies based on device and disease states. Performed at [facility], [address], [place], [place] [address]   APTT     Status: Abnormal   Collection Time: 08/10/23  6:05 PM  Result Value Ref Range   aPTT 39 (H) 24 - 36 seconds    Comment:        IF BASELINE aPTT IS ELEVATED, SUGGEST PATIENT RISK ASSESSMENT BE USED TO DETERMINE APPROPRIATE ANTICOAGULANT THERAPY. Performed at [facility], [address] [place], [place] [address]   I-Stat Lactic Acid     Status: None   Collection Time: 08/10/23  6:21 PM  Result Value Ref Range   Lactic Acid, Venous 0.5 0.5 - 1.9 mmol/L   DG Chest Port 1 View  Result Date: 08/10/2023 CLINICAL DATA:  Fever, tachycardia, recent history of double mastectomy EXAM: PORTABLE CHEST 1 VIEW COMPARISON:  04/29/2023 FINDINGS: Single frontal view of the chest demonstrates stable left chest wall port. The cardiac silhouette is unremarkable. No acute airspace disease, effusion, or pneumothorax.  Postsurgical changes from bilateral mastectomy and right axillary lymph node dissection. No acute bony abnormalities. IMPRESSION: 1. No acute intrathoracic process. Electronically Signed   By: Sharlet Salina M.D.   On: 08/10/2023 19:29    Pending Labs Unresulted Labs (From admission, onward)     Start     Ordered   08/17/23 0500  Creatinine, serum  (enoxaparin (LOVENOX)    CrCl >/= 30 ml/min)  Weekly,   R     Comments: while on enoxaparin therapy    08/10/23 1923   08/11/23 0500  CBC  Tomorrow morning,   R        08/10/23 1923   08/11/23 0500  Basic metabolic panel  Tomorrow morning,   R        08/10/23 1923            Vitals/Pain Today's Vitals   08/10/23 1647 08/10/23 1648 08/10/23 1700 08/10/23 1900  BP:   120/78 109/77  Pulse:  (!) 113 (!) 111 (!) 103  Resp:   20 19  Temp:      SpO2:  97% 100% 100%  Weight: 64 kg     PainSc: 0-No pain       Isolation Precautions No active isolations  Medications Medications  enoxaparin (LOVENOX) injection 40 mg (has no administration in time range)  dextrose 5 % and 0.45 % NaCl infusion (has no administration in time range)  acetaminophen (TYLENOL) tablet 650 mg (has no administration in time range)    Or  acetaminophen (TYLENOL) suppository 650 mg (has no administration in time range)  methocarbamol (  ROBAXIN) tablet 500 mg (has no administration in time range)    Or  methocarbamol (ROBAXIN) injection 500 mg (has no administration in time range)  melatonin tablet 3 mg (has no administration in time range)  ondansetron (ZOFRAN-ODT) disintegrating tablet 4 mg (has no administration in time range)    Or  ondansetron (ZOFRAN) injection 4 mg (has no administration in time range)  simethicone (MYLICON) chewable tablet 40 mg (has no administration in time range)  piperacillin-tazobactam (ZOSYN) IVPB 3.375 g (has no administration in time range)  vancomycin (VANCOREADY) IVPB 1250 mg/250 mL (has no administration in time range)   piperacillin-tazobactam (ZOSYN) IVPB 3.375 g (has no administration in time range)  vancomycin (VANCOREADY) IVPB 750 mg/150 mL (has no administration in time range)    Mobility walks      R Report given to:

## 2023-08-10 NOTE — H&P (Signed)
Suzanne Tucker is an 42 y.o. female.   Chief Complaint: fever HPI: 66 yof s/p bilateral mastectomies and right alnd oct 23 by Dr Donell Beers.  Had last drain out 11/5.  She was in emergency room last night with a fever, normal heart rate it looks like.  She is on [place] now and got that last Friday.  She had some drainage where right sided drain was removed and some redness. This was discussed with on call surgeon and she was sent home with doxy. She was seen in our office today by Dr Andrey Campanile. Bedside US did not show fluid. Her heart rate was 108 when seen in our office with normal temp. Decided to try oral abx. She has taken two doses.  She has continued to have fevers and heart rate now 110-125.  She feels ok when I see her as she does not have fever currently.  She represented as she was not better  Past Medical History:  Diagnosis Date   Anginal pain (HCC) 02/2019   related to MVP   Back pain    Breast cancer (HCC)    Depression    Dysrhythmia    MVP   Mitral prolapse    Sleep apnea    mild no cpap indicated    Past Surgical History:  Procedure Laterality Date   BREAST SURGERY Right 2024   Breast biopsy x 2   CESAREAN SECTION  03/30/2008   MASTECTOMY WITH AXILLARY LYMPH NODE DISSECTION Bilateral 07/27/2023   Procedure: BILATERAL MASTECTOMY WITH RIGHT AXILLARY LYMPH NODE DISSECTION;  Surgeon: [name], MD;  Location:  [facility];  Service: General;  Laterality: Bilateral;  PEC BLOCK   PORTACATH PLACEMENT N/A 01/27/2023   Procedure: INSERTION PORT-A-CATH;  Surgeon: [name], MD;  Location: [facility];  Service: General;  Laterality: N/A;  leaving accessed   ROBOTIC ASSISTED TOTAL HYSTERECTOMY Bilateral 03/15/2019   Procedure: XI ROBOTIC ASSISTED TOTAL HYSTERECTOMY with Bilateral Salpingectomy;  Surgeon: Silverio Lay, MD;  Location: [facility];  Service: Gynecology;  Laterality: Bilateral;   TUBAL LIGATION  2017    Family History  Problem  Relation Age of Onset   Stroke Mother    Alzheimer's disease Father    Diabetes Maternal Grandmother    Heart disease Maternal Grandmother    Heart attack Maternal Grandmother    Diabetes Maternal Grandfather    Heart disease Maternal Grandfather    Alzheimer's disease Paternal Grandmother    Dementia Paternal Grandmother    Alzheimer's disease Paternal Grandfather    Dementia Paternal Grandfather    Alcohol abuse Paternal Grandfather    Social History:  reports that she has never smoked. She has never used smokeless tobacco. She reports that she does not currently use alcohol. She reports that she does not use drugs.  Allergies:  Allergies  Allergen Reactions   Morphine Anaphylaxis and Shortness Of Breath   Tessalon [Benzonatate] Hives    Hives, itching, burning all over.    Hydrocodone Hives   Latex Rash   Antihistamines, Loratadine-Type Other (See Comments)    Patient reports numbness in tongue   Codeine Nausea And Vomiting   Tramadol Other (See Comments)    Tingling in the mouth with swelling       Results for orders placed or performed during the hospital encounter of 08/10/23 (from the past 48 hour(s))  Comprehensive metabolic panel     Status: Abnormal   Collection Time: 08/10/23  6:05 PM  Result Value Ref  Range   Sodium 137 135 - 145 mmol/L   Potassium 3.6 3.5 - 5.1 mmol/L   Chloride 104 98 - 111 mmol/L   CO2 24 22 - 32 mmol/L   Glucose, Bld 111 (H) 70 - 99 mg/dL    Comment: Glucose reference range applies only to samples taken after fasting for at least 8 hours.   BUN 9 6 - 20 mg/dL   Creatinine, Ser 0.86 0.44 - 1.00 mg/dL   Calcium 9.2 8.9 - 57.8 mg/dL   Total Protein 7.0 6.5 - 8.1 g/dL   Albumin 3.8 3.5 - 5.0 g/dL   AST 16 15 - 41 U/L   ALT 12 0 - 44 U/L   Alkaline Phosphatase 67 38 - 126 U/L   Total Bilirubin 0.6 <1.2 mg/dL   GFR, Estimated >46 >96 mL/min    Comment: (NOTE) Calculated using the CKD-EPI Creatinine Equation (2021)    Anion gap 9 5 -  15    Comment: Performed at [facility], [address] [place], [place] [address]  CBC with Differential     Status: Abnormal   Collection Time: 08/10/23  6:05 PM  Result Value Ref Range   WBC 10.9 (H) 4.0 - 10.5 K/uL   RBC 3.26 (L) 3.87 - 5.11 MIL/uL   Hemoglobin 10.1 (L) 12.0 - 15.0 g/dL   HCT 41.3 (L) 24.4 - 01.0 %   MCV 94.5 80.0 - 100.0 fL   MCH 31.0 26.0 - 34.0 pg   MCHC 32.8 30.0 - 36.0 g/dL   RDW 27.2 53.6 - 64.4 %   Platelets 292 150 - 400 K/uL   nRBC 0.0 0.0 - 0.2 %   Neutrophils Relative % 83 %   Neutro Abs 9.1 (H) 1.7 - 7.7 K/uL   Lymphocytes Relative 7 %   Lymphs Abs 0.7 0.7 - 4.0 K/uL   Monocytes Relative 5 %   Monocytes Absolute 0.6 0.1 - 1.0 K/uL   Eosinophils Relative 4 %   Eosinophils Absolute 0.4 0.0 - 0.5 K/uL   Basophils Relative 0 %   Basophils Absolute 0.0 0.0 - 0.1 K/uL   Immature Granulocytes 1 %   Abs Immature Granulocytes 0.05 0.00 - 0.07 K/uL    Comment: Performed at [facility], [address], [place], [place] [address]  I-Stat Lactic Acid     Status: None   Collection Time: 08/10/23  6:21 PM  Result Value Ref Range   Lactic Acid, Venous 0.5 0.5 - 1.9 mmol/L   No results found.  Review of Systems  Constitutional:  Positive for fatigue and fever.  All other systems reviewed and are negative.   Blood pressure 109/77, pulse (!) 103, temperature 99.7 F (37.6 C), resp. rate 19, weight 64 kg, SpO2 100%. Physical Exam Vitals reviewed.  Constitutional:      General: She is not in acute distress.    Appearance: Normal appearance.  Cardiovascular:     Rate and Rhythm: Regular rhythm. Tachycardia present.  Pulmonary:     Effort: Pulmonary effort is normal.     Comments: Left chest with no seroma, no wound infection Right chest with no seroma at mastectomy site Right drain site (no drain in) is mildly tender, erythematous- I dont feel anything specific to drain and nothing is draining now Neurological:      Mental Status: She is alert.      Assessment/Plan Cellulitis at drain site s/p bilateral mastectomy  -I think she has failed outpatient  therapy.  She is now continuing to be febrile with tachycardia. I think she needs admission with iv abx.  I will put her on vanc/zosyn given clinical picture as well as the fact she is on keytruda right now -recheck cbc in am -I will check noncon ct to see if any fluid although [place] at office did not show that- chance could need to open drain site if not better -lovenox, scds, regular diet -will give her a liter of fluid then saline lock  I reviewed prior records, er visit from last night, labs. Discussed case with ER provider.   Emelia Loron, MD 08/10/2023, 7:23 PM

## 2023-08-10 NOTE — Therapy (Deleted)
OUTPATIENT PHYSICAL THERAPY BREAST CANCER POST OP FOLLOW UP   Patient Name: Suzanne Tucker MRN: [id number] DOB:1980-12-08, 42 y.o., female Today's Date: 08/10/2023  END OF SESSION:   Past Medical History:  Diagnosis Date   Anginal pain (HCC) 02/2019   related to MVP   Back pain    Breast cancer (HCC)    Depression    Dysrhythmia    MVP   Mitral prolapse    Sleep apnea    mild no cpap indicated   Past Surgical History:  Procedure Laterality Date   BREAST SURGERY Right 2024   Breast biopsy x 2   CESAREAN SECTION  03/30/2008   MASTECTOMY WITH AXILLARY LYMPH NODE DISSECTION Bilateral 07/27/2023   Procedure: BILATERAL MASTECTOMY WITH RIGHT AXILLARY LYMPH NODE DISSECTION;  Surgeon: [name], MD;  Location: [facility];  Service: General;  Laterality: Bilateral;  PEC BLOCK   PORTACATH PLACEMENT N/A 01/27/2023   Procedure: INSERTION PORT-A-CATH;  Surgeon: [name], MD;  Location: [facility];  Service: General;  Laterality: N/A;  leaving accessed   ROBOTIC ASSISTED TOTAL HYSTERECTOMY Bilateral 03/15/2019   Procedure: XI ROBOTIC ASSISTED TOTAL HYSTERECTOMY with Bilateral Salpingectomy;  Surgeon: Silverio Lay, MD;  Location: [facility];  Service: Gynecology;  Laterality: Bilateral;   TUBAL LIGATION  2017   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 [name]  REFERRING DIAG: Right breast cancer  THERAPY DIAG:  Malignant neoplasm of lower-inner quadrant of right breast of female, estrogen receptor negative (HCC)  Abnormal posture  Aftercare following surgery for neoplasm  Rationale for Evaluation and Treatment: Rehabilitation  ONSET DATE: 07/27/2023  SUBJECTIVE:                                                                                                                                                                                           SUBJECTIVE STATEMENT: Patient reports she underwent neoadjuvant chemotherapy from 01/28/2023 - 06/27/2023 followed by a bilateral mastectomy and right axillary  lymph node dissection (8 of 20 nodes positive) on 07/27/2023. She is triple negative and will undergo radiation on the right side.  PERTINENT HISTORY:  Patient was diagnosed on 01/07/2023 with right grade 3 invasive ductal carcinoma breast cancer. 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 She had a bilateral mastectomy and right axillary lymph node dissection (8 of 20 nodes positive) on 07/27/2023.  PATIENT GOALS:  Reassess how my recovery is going related to arm function, pain, and swelling.  PAIN:  Are you having pain? {OPRCPAIN:27236}  PRECAUTIONS: Recent Surgery, right UE Lymphedema risk  RED FLAGS: {PT Red Flags:29287}   ACTIVITY LEVEL / LEISURE: ***   OBJECTIVE:   PATIENT SURVEYS:  QUICK DASH: ***  OBSERVATIONS: ***  POSTURE:  ***  LYMPHEDEMA ASSESSMENT:   UPPER EXTREMITY AROM/PROM:   A/PROM RIGHT   eval   RIGHT 08/11/2023  Shoulder extension 53   Shoulder flexion 157   Shoulder abduction 159   Shoulder internal rotation 71   Shoulder external rotation 86                           (Blank rows = not tested)   A/PROM LEFT   eval LEFT 08/11/2023  Shoulder extension 43   Shoulder flexion 150    Shoulder abduction 160   Shoulder internal rotation 72   Shoulder external rotation 76                           (Blank rows = not tested)   CERVICAL AROM: All within normal limits   UPPER EXTREMITY STRENGTH: WFL   LYMPHEDEMA ASSESSMENTS:    LANDMARK RIGHT   eval RIGHT 08/11/2023  10 cm proximal to olecranon process 28.5   Olecranon process 24.1   10 cm proximal to ulnar styloid process 23.6   Just proximal to ulnar styloid process 15.6   Across hand at thumb web space 17.7   At base of 2nd digit 5.9   (Blank rows = not tested)   LANDMARK LEFT   eval LEFT 08/11/2023  10 cm proximal to olecranon process 28.6   Olecranon process 23.8   10 cm proximal to ulnar styloid process 21.9   Just proximal to ulnar styloid process 15.4   Across hand at thumb web space 17.7   At base of 2nd digit 5.6   (Blank rows = not tested) Surgery type/Date: Bilateral mastectomy and right ALND 07/27/2023 Number of lymph nodes removed: 20 Current/past treatment (chemo, radiation, hormone therapy): Neoadjuvant chemotherapy Other symptoms:  Heaviness/tightness {yes/no:20286} Pain {yes/no:20286} Pitting edema {yes/no:20286} Infections {yes/no:20286} Decreased scar mobility {yes/no:20286} Stemmer sign {yes/no:20286}  PATIENT EDUCATION:  Education details: *** Person educated: {Person educated:25204} Education method: {Education Method:25205} Education comprehension: {Education Comprehension:25206}  HOME EXERCISE PROGRAM: Reviewed previously given post op HEP. ***  ASSESSMENT:  CLINICAL IMPRESSION: ***  Pt will benefit from skilled therapeutic intervention to improve on the following deficits: Decreased knowledge of precautions, impaired UE functional use, pain, decreased ROM, postural dysfunction.   PT treatment/interventions: ADL/Self care home management, {rehab planned interventions:25118::"97110-Therapeutic exercises","97530- Therapeutic [phone]- Neuromuscular  re-education","97535- Self NGEX","52841- Manual therapy"}   GOALS: Goals reviewed with patient? Yes  LONG TERM GOALS:  (STG=LTG)  GOALS Name Target Date  Goal status  1 Pt will demonstrate she has regained full shoulder ROM and function post operatively compared to baselines.  Baseline: 07/21/2023 INITIAL  2  *** INITIAL  3  ***  INITIAL  4  *** INITIAL     PLAN:  PT FREQUENCY/DURATION: ***  PLAN FOR NEXT SESSION: ***   [facility]  [address]  [place] [address]  [phone]  After Breast Cancer Class It is recommended you attend the ABC class to be educated on lymphedema risk reduction. This class is free of charge and lasts for 1 hour. It is a 1-time class. You will need to download the TEAMS app either on your phone or computer. We will send you a link the night before or the morning of the class. You should be able to click on that link to join the class. This is not a confidential class. You don't have to turn your camera on, but other participants may be able to see your email address.  Scar massage You can begin gentle scar massage to you incision sites. Gently place one hand on the incision and move the skin (without sliding on the skin) in various directions. Do this for a few minutes and then you can gently massage either coconut oil or vitamin E cream into the scars.  Compression garment You should continue wearing your compression bra until you feel like you no longer have swelling.  Home exercise Program Continue doing the exercises you were given until you feel like you can do them without feeling any tightness at the end.   Walking Program Studies show that 30 minutes of walking per day (fast enough to elevate your heart rate) can significantly reduce the risk of a cancer recurrence. If you can't walk due to other medical reasons, we encourage you to find another activity you could do (like a stationary bike or water  exercise).  Posture After breast cancer surgery, people frequently sit with rounded shoulders posture because it puts their incisions on slack and feels better. If you sit like this and scar tissue forms in that position, you can become very tight and have pain sitting or standing with good posture. Try to be aware of your posture and sit and stand up tall to heal properly.  Follow up PT: It is recommended you return every 3 months for the first 3 years following surgery to be assessed on the SOZO machine for an L-Dex score. This helps prevent clinically significant lymphedema in 95% of patients. These follow up screens are 10 minute appointments that you are not billed for.  Yitzel Shasteen,MARTI COOPER, PT 08/10/2023, 1:16 PM

## 2023-08-10 NOTE — Progress Notes (Signed)
Pharmacy Antibiotic Note  Suzanne Tucker is a 42 y.o. female admitted on 08/10/2023 with cellulitis.  Pharmacy has been consulted for Vanco, Zosyn dosing.  Active Problem(s): Fever  s/p bilateral mastectomies and right alnd oct 23 by Dr Donell Beers. Had last drain out 11/5.   - 11/5: ED visits with fever and She had some drainage where right sided drain was removed >>discharged with Doxy. (Has received 2 doses) - 11/6: OP MD visit today and back to ED with fever and tachycardia.   ID: Cellulitis at drain site s/p BL mastectomy. Immunosuppressed.  - WBC 10.9, Scr 0.62,   Doxy PTA Vanco 11/6>> Zosyn 11/6>>  Plan: Vanco 1250mg  IV x 1 Vancomycin 750 mg IV Q 12 hrs. Goal AUC 400-550. Expected AUC: 517 SCr used: 0.8  Zosyn 3.375g IV q8hr    Weight: 64 kg (141 lb 1.5 oz)  Temp (24hrs), Avg:99.5 F (37.5 C), Min:99.3 F (37.4 C), Max:99.7 F (37.6 C)  Recent Labs  Lab 08/05/23 1058 08/10/23 0047 08/10/23 0100 08/10/23 1805 08/10/23 1821  WBC 5.5 12.5*  --  10.9*  --   CREATININE 0.60 0.55  --  0.62  --   LATICACIDVEN  --   --  0.6  --  0.5    Estimated Creatinine Clearance: 80.1 mL/min (by C-G formula based on SCr of 0.62 mg/dL).    Allergies  Allergen Reactions   Morphine Anaphylaxis and Shortness Of Breath   Tessalon [Benzonatate] Hives    Hives, itching, burning all over.    Hydrocodone Hives   Latex Rash   Antihistamines, Loratadine-Type Other (See Comments)    Patient reports numbness in tongue   Codeine Nausea And Vomiting   Tramadol Other (See Comments)    Tingling in the mouth with swelling     Suzanne Tucker S. Suzanne Tucker, PharmD, BCPS Clinical Staff Pharmacist [email or web address]  Suzanne Tucker 08/10/2023 7:51 PM

## 2023-08-10 NOTE — ED Provider Notes (Signed)
[place] EMERGENCY DEPARTMENT AT [facility] Provider Note   CSN: [id number] Arrival date & time: 08/10/23  1635     History  Chief Complaint  Patient presents with   Fever    Suzanne Tucker is a 42 y.o. female.  With history of anxiety, breast cancer with bilateral mastectomy on 07/27/2023 on Keytruda presents to the ED for evaluation of fever.  She had the left drain removed 4 days ago.  She had the right drain removed yesterday.  She developed a fever shortly afterwards.  She presented to the emergency department yesterday shortly afterwards.  The patient was given an option for admission for IV antibiotics versus discharge home with oral antibiotics.  She chose to be discharged home on oral antibiotics and was started on doxycycline.  She has taken 2 doses of this.  She followed up with Dr. Sherril Cong today and was doing well.  When she came home, she developed a fever of 100.8 and called her oncology nurse and surgical nurse who both recommended she come to the emergency department for reevaluation.  She took Tylenol approximately 1 hour prior to arrival.  She reports the right wound has gotten slightly more puffy and erythematous.  Mild drainage.   Fever      Home Medications Prior to Admission medications   Medication Sig Start Date End Date Taking? Authorizing Provider  acetaminophen (TYLENOL) 500 MG tablet Take 1,000 mg by mouth every 6 (six) hours as needed for mild pain or moderate pain.    [provider]  calcium carbonate (TUMS - DOSED IN MG ELEMENTAL CALCIUM) 500 MG chewable tablet Chew 1,000 mg by mouth as needed for indigestion or heartburn.    [provider]  doxycycline (VIBRAMYCIN) 100 MG capsule Take 1 capsule (100 mg total) by mouth 2 (two) times daily. One po bid x 7 days 08/10/23   Zadie Rhine, MD  fluticasone [employer]) 50 MCG/ACT nasal spray Place 2 sprays into both nostrils daily. Patient taking differently:  Place 2 sprays into both nostrils daily as needed for allergies or rhinitis. 12/02/22   Leath-Warren, Sadie Haber, NP  ibuprofen (ADVIL) 600 MG tablet Take 1 tablet (600 mg total) by mouth every 6 (six) hours as needed for mild pain (pain score 1-3), moderate pain (pain score 4-6) or headache. 07/28/23   [name], MD  methocarbamol (ROBAXIN) 500 MG tablet Take 1 tablet (500 mg total) by mouth every 6 (six) hours as needed for muscle spasms. Patient not taking: Reported on 08/10/2023 07/28/23   [name], MD  Multiple Vitamin (MULTI-VITAMIN) tablet Take 1 tablet by mouth daily. Patient not taking: Reported on 08/10/2023    [provider]  ondansetron (ZOFRAN) 8 MG tablet Take 1 tablet (8 mg total) by mouth every 8 (eight) hours as needed for nausea or vomiting. Start on the third day after chemotherapy. Patient not taking: Reported on 08/10/2023 01/19/23   Serena Croissant, MD      Allergies    Morphine; Tessalon [benzonatate]; Hydrocodone; Latex; Antihistamines, loratadine-type; Codeine; and Tramadol    Review of Systems   Review of Systems  Constitutional:  Positive for fever.  All other systems reviewed and are negative.   Physical Exam Updated Vital Signs BP 109/77   Pulse (!) 103   Temp 99.7 F (37.6 C)   Resp 19   Wt 64 kg   SpO2 100%   BMI 26.66 kg/m  Physical Exam Vitals and nursing note reviewed.  Constitutional:  General: She is not in acute distress.    Appearance: She is well-developed.     Comments: Resting comfortably in bed  HENT:     Head: Normocephalic and atraumatic.  Eyes:     Conjunctiva/sclera: Conjunctivae normal.  Cardiovascular:     Rate and Rhythm: Normal rate and regular rhythm.     Heart sounds: No murmur heard. Pulmonary:     Effort: Pulmonary effort is normal. No respiratory distress.     Breath sounds: Normal breath sounds.  Abdominal:     Palpations: Abdomen is soft.     Tenderness: There is no abdominal tenderness.   Musculoskeletal:        General: No swelling.     Cervical back: Neck supple.  Skin:    General: Skin is warm and dry.     Capillary Refill: Capillary refill takes less than 2 seconds.     Comments: Well-healing left chest surgical wounds.  Right mastectomy scar well-healing.  Right drainage scar with no purulent drainage, moderate surrounding erythema, no induration  Neurological:     Mental Status: She is alert.  Psychiatric:        Mood and Affect: Mood normal.     ED Results / Procedures / Treatments   Labs (all labs ordered are listed, but only abnormal results are displayed) Labs Reviewed  COMPREHENSIVE METABOLIC PANEL - Abnormal; Notable for the following components:      Result Value   Glucose, Bld 111 (*)    All other components within normal limits  CBC WITH DIFFERENTIAL/PLATELET - Abnormal; Notable for the following components:   WBC 10.9 (*)    RBC 3.26 (*)    Hemoglobin 10.1 (*)    HCT 30.8 (*)    Neutro Abs 9.1 (*)    All other components within normal limits  PROTIME-INR - Abnormal; Notable for the following components:   Prothrombin Time 15.3 (*)    All other components within normal limits  APTT - Abnormal; Notable for the following components:   aPTT 39 (*)    All other components within normal limits  URINALYSIS, W/ REFLEX TO CULTURE (INFECTION SUSPECTED) - Abnormal; Notable for the following components:   Color, Urine COLORLESS (*)    Specific Gravity, Urine 1.004 (*)    All other components within normal limits  CBC  BASIC METABOLIC PANEL  I-STAT CG4 LACTIC ACID, ED    EKG None  Radiology DG Chest Port 1 View  Result Date: 08/10/2023 CLINICAL DATA:  Fever, tachycardia, recent history of double mastectomy EXAM: PORTABLE CHEST 1 VIEW COMPARISON:  04/29/2023 FINDINGS: Single frontal view of the chest demonstrates stable left chest wall port. The cardiac silhouette is unremarkable. No acute airspace disease, effusion, or pneumothorax. Postsurgical  changes from bilateral mastectomy and right axillary lymph node dissection. No acute bony abnormalities. IMPRESSION: 1. No acute intrathoracic process. Electronically Signed   By: Sharlet Salina M.D.   On: 08/10/2023 19:29    Procedures Procedures    Medications Ordered in ED Medications  enoxaparin (LOVENOX) injection 40 mg (has no administration in time range)  dextrose 5 % and 0.45 % NaCl infusion (has no administration in time range)  acetaminophen (TYLENOL) tablet 650 mg (has no administration in time range)    Or  acetaminophen (TYLENOL) suppository 650 mg (has no administration in time range)  methocarbamol (ROBAXIN) tablet 500 mg (has no administration in time range)    Or  methocarbamol (ROBAXIN) injection 500 mg (has no administration in  time range)  melatonin tablet 3 mg (has no administration in time range)  ondansetron (ZOFRAN-ODT) disintegrating tablet 4 mg (has no administration in time range)    Or  ondansetron (ZOFRAN) injection 4 mg (has no administration in time range)  simethicone (MYLICON) chewable tablet 40 mg (has no administration in time range)  piperacillin-tazobactam (ZOSYN) IVPB 3.375 g (has no administration in time range)  vancomycin (VANCOREADY) IVPB 1250 mg/250 mL (has no administration in time range)  piperacillin-tazobactam (ZOSYN) IVPB 3.375 g (has no administration in time range)  vancomycin (VANCOREADY) IVPB 750 mg/150 mL (has no administration in time range)    ED Course/ Medical Decision Making/ A&P Clinical Course as of 08/10/23 1954  Wed Aug 10, 2023  1847 Spoke with general surgery Dr. Dwain Sarna who will evaluate patient [AS]    Clinical Course User Index [AS] Lula Olszewski Edsel Petrin, PA-C                                 Medical Decision Making This patient presents to the ED for concern of postoperative infection, this involves an extensive number of treatment options, and is a complaint that carries with it a high risk of complications and  morbidity.  The differential diagnosis includes postoperative infection, sepsis, bacteremia, cellulitis  My initial workup includes labs  Additional history obtained from: Nursing notes from this visit.  I ordered, reviewed and interpreted labs which include: CBC, CMP, APTT, INR, lactate, urinalysis.  Leukocytosis of 10.9.  Anemia with a hemoglobin of 10.1.  I ordered imaging studies including chest x-ray I independently visualized and interpreted imaging which showed negative I agree with the radiologist interpretation  Cardiac Monitoring:  The patient was maintained on a cardiac monitor.  I personally viewed and interpreted the cardiac monitored which showed an underlying rhythm of: Sinus tachycardia  Consultations Obtained:  I requested consultation with the general surgery Dr. Dwain Sarna,  and discussed lab and imaging findings as well as pertinent plan - they recommend: Admission, IV antibiotics  Borderline febrile, tachycardic, otherwise hemodynamically stable.  42 year old female presenting to the ED for evaluation of fever at home.  Recently had bilateral mastectomy.  Presented to the emergency department yesterday and after shared decision-making conversation, was discharged home on oral antibiotics.  She states the swelling and erythema has gotten worse despite 2 doses of doxycycline.  Patient does have some erythema to the drain wound to the right breast.  Consulted with general surgery who evaluated the patient in the emergency department.  Recommends admission and IV antibiotics.  Patient is in agreement with this plan.  Stable at the time of admission.  At this time there does not appear to be any evidence of an acute emergency medical condition and the patient appears stable for discharge with appropriate outpatient follow up. Diagnosis was discussed with patient who verbalizes understanding of care plan and is agreeable to discharge. I have discussed return precautions with  patient who verbalizes understanding. Patient encouraged to follow-up with their PCP within 1 week. All questions answered.  Note: Portions of this report may have been transcribed using voice recognition software. Every effort was made to ensure accuracy; however, inadvertent computerized transcription errors may still be present.        Final Clinical Impression(s) / ED Diagnoses Final diagnoses:  Postoperative infection, unspecified type, initial encounter  Immunocompromised state (HCC)    Rx / DC Orders ED Discharge Orders  None         Mora Bellman 08/10/23 1954    Tegeler, Canary Brim, MD 08/10/23 [phone]

## 2023-08-10 NOTE — ED Notes (Signed)
Pt w/ a double mastectomy, restricted to RUE.Pink restricted arm band applied.

## 2023-08-10 NOTE — Discharge Instructions (Addendum)
Please call your general surgeon today for close follow-up.

## 2023-08-10 NOTE — ED Triage Notes (Signed)
C/o Fever with temp max 100.9F.  Hx of double mastectomy 2 weeks age with drain placed.  Pt reports seen yesterday for same and was placed on abx.  Tylenol @ 1500.

## 2023-08-11 ENCOUNTER — Encounter: Payer: Self-pay | Admitting: Physical Therapy

## 2023-08-11 ENCOUNTER — Ambulatory Visit: Payer: Managed Care, Other (non HMO) | Admitting: Physical Therapy

## 2023-08-11 DIAGNOSIS — Z90710 Acquired absence of both cervix and uterus: Secondary | ICD-10-CM | POA: Diagnosis not present

## 2023-08-11 DIAGNOSIS — R293 Abnormal posture: Secondary | ICD-10-CM

## 2023-08-11 DIAGNOSIS — Z9109 Other allergy status, other than to drugs and biological substances: Secondary | ICD-10-CM | POA: Diagnosis not present

## 2023-08-11 DIAGNOSIS — Z885 Allergy status to narcotic agent status: Secondary | ICD-10-CM | POA: Diagnosis not present

## 2023-08-11 DIAGNOSIS — Z91048 Other nonmedicinal substance allergy status: Secondary | ICD-10-CM | POA: Diagnosis not present

## 2023-08-11 DIAGNOSIS — T8149XA Infection following a procedure, other surgical site, initial encounter: Secondary | ICD-10-CM | POA: Diagnosis present

## 2023-08-11 DIAGNOSIS — Z87892 Personal history of anaphylaxis: Secondary | ICD-10-CM | POA: Diagnosis not present

## 2023-08-11 DIAGNOSIS — Z79620 Long term (current) use of immunosuppressive biologic: Secondary | ICD-10-CM | POA: Diagnosis not present

## 2023-08-11 DIAGNOSIS — R6510 Systemic inflammatory response syndrome (SIRS) of non-infectious origin without acute organ dysfunction: Secondary | ICD-10-CM | POA: Diagnosis present

## 2023-08-11 DIAGNOSIS — Z823 Family history of stroke: Secondary | ICD-10-CM | POA: Diagnosis not present

## 2023-08-11 DIAGNOSIS — D849 Immunodeficiency, unspecified: Secondary | ICD-10-CM | POA: Diagnosis present

## 2023-08-11 DIAGNOSIS — Z888 Allergy status to other drugs, medicaments and biological substances status: Secondary | ICD-10-CM | POA: Diagnosis not present

## 2023-08-11 DIAGNOSIS — Z9221 Personal history of antineoplastic chemotherapy: Secondary | ICD-10-CM | POA: Diagnosis not present

## 2023-08-11 DIAGNOSIS — C50911 Malignant neoplasm of unspecified site of right female breast: Secondary | ICD-10-CM | POA: Diagnosis present

## 2023-08-11 DIAGNOSIS — Z833 Family history of diabetes mellitus: Secondary | ICD-10-CM | POA: Diagnosis not present

## 2023-08-11 DIAGNOSIS — Z91040 Latex allergy status: Secondary | ICD-10-CM | POA: Diagnosis not present

## 2023-08-11 DIAGNOSIS — Y838 Other surgical procedures as the cause of abnormal reaction of the patient, or of later complication, without mention of misadventure at the time of the procedure: Secondary | ICD-10-CM | POA: Diagnosis present

## 2023-08-11 DIAGNOSIS — I341 Nonrheumatic mitral (valve) prolapse: Secondary | ICD-10-CM | POA: Diagnosis present

## 2023-08-11 DIAGNOSIS — Z8249 Family history of ischemic heart disease and other diseases of the circulatory system: Secondary | ICD-10-CM | POA: Diagnosis not present

## 2023-08-11 DIAGNOSIS — D649 Anemia, unspecified: Secondary | ICD-10-CM | POA: Diagnosis present

## 2023-08-11 DIAGNOSIS — Z9013 Acquired absence of bilateral breasts and nipples: Secondary | ICD-10-CM | POA: Diagnosis not present

## 2023-08-11 DIAGNOSIS — Z820 Family history of epilepsy and other diseases of the nervous system: Secondary | ICD-10-CM | POA: Diagnosis not present

## 2023-08-11 DIAGNOSIS — Z483 Aftercare following surgery for neoplasm: Secondary | ICD-10-CM

## 2023-08-11 DIAGNOSIS — C50311 Malignant neoplasm of lower-inner quadrant of right female breast: Secondary | ICD-10-CM

## 2023-08-11 LAB — CBC
HCT: 30.5 % — ABNORMAL LOW (ref 36.0–46.0)
Hemoglobin: 9.7 g/dL — ABNORMAL LOW (ref 12.0–15.0)
MCH: 31.2 pg (ref 26.0–34.0)
MCHC: 31.8 g/dL (ref 30.0–36.0)
MCV: 98.1 fL (ref 80.0–100.0)
Platelets: 218 10*3/uL (ref 150–400)
RBC: 3.11 MIL/uL — ABNORMAL LOW (ref 3.87–5.11)
RDW: 12.7 % (ref 11.5–15.5)
WBC: 10 10*3/uL (ref 4.0–10.5)
nRBC: 0 % (ref 0.0–0.2)

## 2023-08-11 LAB — BASIC METABOLIC PANEL
Anion gap: 11 (ref 5–15)
BUN: 7 mg/dL (ref 6–20)
CO2: 21 mmol/L — ABNORMAL LOW (ref 22–32)
Calcium: 8.8 mg/dL — ABNORMAL LOW (ref 8.9–10.3)
Chloride: 105 mmol/L (ref 98–111)
Creatinine, Ser: 0.58 mg/dL (ref 0.44–1.00)
GFR, Estimated: >60 mL/min (ref >60)
Glucose, Bld: 112 mg/dL — ABNORMAL HIGH (ref 70–99)
Potassium: 3.5 mmol/L (ref 3.5–5.1)
Sodium: 137 mmol/L (ref 135–145)

## 2023-08-11 NOTE — Progress Notes (Signed)
Subjective/Chief Complaint: Pt feeling quite a bit better this am but still having purulent drainage from former right drain site.  No n/v. Temperature curve is down.    Objective: Vital signs in last 24 hours: Temp:  [98.7 F (37.1 C)-100.4 F (38 C)] 98.7 F (37.1 C) (11/07 0927) Pulse Rate:  [99-119] 103 (11/07 0927) Resp:  [16-20] 16 (11/07 0927) BP: (109-136)/(65-101) 118/67 (11/07 0927) SpO2:  [96 %-100 %] 99 % (11/07 0927) Weight:  [63.3 kg-64 kg] 63.3 kg (11/06 2253) Last BM Date : 08/10/23  Intake/Output from previous day: 11/06 0701 - 11/07 0700 In: 1055.2 [I.V.:1012.4; IV Piggyback:42.7] Out: -  Intake/Output this shift: Total I/O In: 120 [P.O.:120] Out: -   General appearance: alert, cooperative, and mild distress Resp: breathing comfortably Chest wall: left mastectomy site flat. Some healing bruising around port.  Right side with no superior hematoma, but some swelling inferolaterally.  Purulent drainage present from former drain site.  Only a small amount could be milked out.  Faint erythema.    Lab Results:  Recent Labs    08/10/23 1805 08/11/23 0353  WBC 10.9* 10.0  HGB 10.1* 9.7*  HCT 30.8* 30.5*  PLT 292 218   BMET Recent Labs    08/10/23 1805 08/11/23 0353  NA 137 137  K 3.6 3.5  CL 104 105  CO2 24 21*  GLUCOSE 111* 112*  BUN 9 7  CREATININE 0.62 0.58  CALCIUM 9.2 8.8*   PT/INR Recent Labs    08/10/23 1805  LABPROT 15.3*  INR 1.2   ABG No results for input(s): "PHART", "HCO3" in the last 72 hours.  Invalid input(s): "PCO2", "PO2"  Studies/Results: CT CHEST WO CONTRAST  Result Date: 08/10/2023 CLINICAL DATA:  Bilateral mastectomy, chest wall mass EXAM: CT CHEST WITHOUT CONTRAST TECHNIQUE: Multidetector CT imaging of the chest was performed following the standard protocol without IV contrast. RADIATION DOSE REDUCTION: This exam was performed according to the departmental dose-optimization program which includes automated  exposure control, adjustment of the mA and/or kV according to patient size and/or use of iterative reconstruction technique. COMPARISON:  08/10/2023, 01/30/2023 FINDINGS: Cardiovascular: Unenhanced imaging of the heart is unremarkable without pericardial effusion. Normal caliber of the thoracic aorta. Left chest wall port via subclavian approach, tip within the superior vena cava. Evaluation of the vascular lumen is limited without IV contrast. Mediastinum/Nodes: No enlarged mediastinal or axillary lymph nodes. Thyroid gland, trachea, and esophagus demonstrate no significant findings. Lungs/Pleura: No acute airspace disease, effusion, or pneumothorax. Central airways are patent. Upper Abdomen: No acute abnormality. Musculoskeletal: Postsurgical changes are seen from bilateral mastectomies. Slightly hyperdense fluid within the left mastectomy site, measuring up to 1 cm in thickness, likely representing postoperative hematoma. No evidence of abscess on this limited unenhanced exam. Small amount of subcutaneous gas and fluid are seen along the right chest wall extending to the right axilla, likely related to prior surgical drain. Multiple lymphadenectomy clips are seen within the right axilla. There are no acute or destructive bony abnormalities. Reconstructed images demonstrate no additional findings. IMPRESSION: 1. Postsurgical changes from bilateral mastectomies, with likely residual hematoma along the left mastectomy site. Minimal subcutaneous gas in the right chest wall and axilla likely related to recent surgical drain removal. No evidence of abscess on this limited unenhanced exam. 2. No acute intrathoracic process. Electronically Signed   By: Sharlet Salina M.D.   On: 08/10/2023 20:52   DG Chest Port 1 View  Result Date: 08/10/2023 CLINICAL DATA:  Fever, tachycardia,  recent history of double mastectomy EXAM: PORTABLE CHEST 1 VIEW COMPARISON:  04/29/2023 FINDINGS: Single frontal view of the chest  demonstrates stable left chest wall port. The cardiac silhouette is unremarkable. No acute airspace disease, effusion, or pneumothorax. Postsurgical changes from bilateral mastectomy and right axillary lymph node dissection. No acute bony abnormalities. IMPRESSION: 1. No acute intrathoracic process. Electronically Signed   By: Sharlet Salina M.D.   On: 08/10/2023 19:29    Anti-infectives: Anti-infectives (From admission, onward)    Start     Dose/Rate Route Frequency Ordered Stop   08/11/23 0900  vancomycin (VANCOREADY) IVPB 750 mg/150 mL        750 mg 150 mL/hr over 60 Minutes Intravenous Every 12 hours 08/10/23 1951     08/11/23 0600  piperacillin-tazobactam (ZOSYN) IVPB 3.375 g        3.375 g 12.5 mL/hr over 240 Minutes Intravenous Every 8 hours 08/10/23 1951     08/10/23 2000  piperacillin-tazobactam (ZOSYN) IVPB 3.375 g        3.375 g 100 mL/hr over 30 Minutes Intravenous STAT 08/10/23 1949 08/10/23 2146   08/10/23 2000  vancomycin (VANCOREADY) IVPB 1250 mg/250 mL        1,250 mg 166.7 mL/hr over 90 Minutes Intravenous STAT 08/10/23 1949 08/10/23 2217       Assessment/Plan:  Right breast cancer, ypT 0ypN2a, s/p bilateral mastectomies with right ALND 07/27/23, s/p neoadjuvant chemotherapy, now on keytruda.  CT without evidence of undrained fluid collection.  Right inferior flap infection. Continue zosyn/vanc for broad spectrum coverage.   Still tachycardic. Fever curve improved, but still elevated.  Play d/c tomorrow after another 24 hours of IV antibiotics.  Warm compresses to infection site.    VTE ppx with lovenox.    LOS: 0 days    [name] 08/11/2023

## 2023-08-11 NOTE — Plan of Care (Signed)
  Problem: Education: Goal: Knowledge of General Education information will improve Description: Including pain rating scale, medication(s)/side effects and non-pharmacologic comfort measures Outcome: Progressing   Problem: Activity: Goal: Risk for activity intolerance will decrease Outcome: Progressing   Problem: Nutrition: Goal: Adequate nutrition will be maintained Outcome: Progressing   Problem: Pain Management: Goal: General experience of comfort will improve Outcome: Progressing   Problem: Skin Integrity: Goal: Risk for impaired skin integrity will decrease Outcome: Progressing

## 2023-08-12 NOTE — Discharge Instructions (Signed)
Warm compresses 1-2 times per day.  Cover drain site as needed.

## 2023-08-12 NOTE — Discharge Summary (Signed)
Physician Discharge Summary  Patient ID: Suzanne Tucker MRN: [id number] DOB/AGE: 1981-05-02 42 y.o.  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   Antineoplastic chemotherapy induced pancytopenia (HCC) 05/02/2023   Neutropenic fever (HCC) 04/30/2023   Neutropenia (HCC) 04/29/2023   Genetic testing 02/08/2023   Port-A-Cath in place 01/28/2023   Malignant neoplasm of lower-inner quadrant of right breast of female, estrogen receptor negative (HCC) 01/17/2023   Axillary mass, right 12/28/2022   Palpitations 11/23/2022   Dizzy spells 03/04/2022   Carpal tunnel syndrome on right 11/02/2017   MVP (mitral valve prolapse) 10/28/2017    Discharge Diagnoses:  Principal Problem:   Complication, postoperative infection  And same as above  Discharged Condition: stable  Hospital Course:  Patient was admitted Nov 6 with fevers and tachycardia.  She was postop bilateral mastectomies with right axillary lymph node dissection on October 23.  She had been seen in the clinic and had some redness at the drain site.  She had been placed on oral antibiotics.  Unfortunately over the course of that day, she became increasingly febrile and shaky feeling.  She started having purulent drainage from her previous drain site.  She was admitted for SIRS.  She was placed on IV antibiotics and defervesced.  She continued to have a small amount of purulent drainage, but this did stop.  She had originally had some erythema and this also improved.  She is feeling much better and was discharged to home in stable condition with just local wound care to the former drain site.  Consults: None  Significant Diagnostic  Studies: labs: WBCs 10 prior to d/c  Treatments: antibiotics: vancomycin and Zosyn  Discharge Exam: Blood pressure 101/64, pulse 75, temperature 98.4 F (36.9 C), temperature source Oral, resp. rate 18, height 5\' 1"  (1.549 m), weight 63.3 kg, SpO2 99%. General appearance: alert, cooperative, and no distress Resp: breathing comfortably Chest wall: right inferolateral chest wall tenderness and purulent drainage 11/6 and 11/7.  By 11/8 this had slowed down with just some staining on the dressing.   Disposition: Discharge disposition: 01-Home or Self Care       Discharge Instructions     Call MD for:  difficulty breathing, headache or visual disturbances   Complete by: As directed    Call MD for:  hives   Complete by: As directed    Call MD for:  persistant nausea and vomiting   Complete by: As directed    Call MD for:  redness, tenderness, or signs of infection (pain, swelling, redness, odor or green/yellow discharge around incision site)   Complete by: As directed    Call MD for:  severe uncontrolled pain   Complete by: As directed    Call MD for:  temperature >100.4   Complete by: As directed    Diet - low sodium heart healthy   Complete by: As directed    Increase activity slowly   Complete by: As directed       Allergies as of 08/12/2023       Reactions   Morphine Anaphylaxis, Shortness Of Breath   Tape Other (See Comments)   NEEDS DRESSINGS FOR SENSITIVE SKIN!!!   Tessalon [benzonatate] Hives, Itching, Other (See Comments)   Hives, itching, burning  all over.    Hydrocodone Hives   Latex Rash   Antihistamines, Loratadine-type Other (See Comments)   Patient reports numbness of the tongue   Codeine Nausea And Vomiting   Kiwi Extract Swelling, Other (See Comments)   Mouth tingles, too   Oxycontin [oxycodone] Other (See Comments)   RPh has recommended this not be taken  (Oxycontin)   Tramadol Swelling, Other (See Comments)   Tingling in the mouth with swelling         Medication List     STOP taking these medications    amoxicillin 500 MG capsule Commonly known as: AMOXIL       TAKE these medications    acetaminophen 500 MG tablet Commonly known as: TYLENOL Take 1,000 mg by mouth every 6 (six) hours as needed for fever or mild pain (pain score 1-3).   calcium carbonate 500 MG chewable tablet Commonly known as: TUMS - dosed in mg elemental calcium Chew 1,000 mg by mouth as needed for indigestion or heartburn.   doxycycline 100 MG capsule Commonly known as: VIBRAMYCIN Take 1 capsule (100 mg total) by mouth 2 (two) times daily. One po bid x 7 days   fluticasone 50 MCG/ACT nasal spray Commonly known as: FLONASE Place 2 sprays into both nostrils daily. What changed:  when to take this reasons to take this   Ibuprofen 200 MG Caps Take 200-400 mg by mouth every 6 (six) hours as needed (for mild pain or fever).   ibuprofen 600 MG tablet Commonly known as: ADVIL Take 1 tablet (600 mg total) by mouth every 6 (six) hours as needed for mild pain (pain score 1-3), moderate pain (pain score 4-6) or headache.   methocarbamol 500 MG tablet Commonly known as: ROBAXIN Take 1 tablet (500 mg total) by mouth every 6 (six) hours as needed for muscle spasms.   ondansetron 8 MG tablet Commonly known as: Zofran Take 1 tablet (8 mg total) by mouth every 8 (eight) hours as needed for nausea or vomiting. Start on the third day after chemotherapy.        Follow-up Information     [name], MD Follow up in 1 week(s).   Specialty: General Surgery Contact information: [address] [place] [address] [phone]                 Signed: [name] 08/17/2023, 8:56 AM

## 2023-08-12 NOTE — Plan of Care (Signed)
  Problem: Clinical Measurements: Goal: Ability to maintain clinical measurements within normal limits will improve Outcome: Progressing   Problem: Pain Management: Goal: General experience of comfort will improve Outcome: Progressing   Problem: Safety: Goal: Ability to remain free from injury will improve Outcome: Progressing

## 2023-08-12 NOTE — Progress Notes (Signed)
   08/12/23 0859  TOC Brief Assessment  Insurance and Status Reviewed  Patient has primary care physician No  Home environment has been reviewed Resides with significant other  Prior level of function: Independent at baseline  Prior/Current Home Services No current home services  Social Determinants of Health Reivew SDOH reviewed no interventions necessary  Readmission risk has been reviewed Yes  Transition of care needs no transition of care needs at this time

## 2023-08-13 NOTE — Therapy (Unsigned)
OUTPATIENT PHYSICAL THERAPY BREAST CANCER POST OP FOLLOW UP   Patient Name: Suzanne Tucker MRN: [id number] DOB:April 17, 1981, 42 y.o., female Today's Date: 08/13/2023  END OF SESSION:   Past Medical History:  Diagnosis Date   Anginal pain (HCC) 02/2019   related to MVP   Back pain    Breast cancer (HCC)    Depression    Dysrhythmia    MVP   Mitral prolapse    Sleep apnea    mild no cpap indicated   Past Surgical History:  Procedure Laterality Date   BREAST SURGERY Right 2024   Breast biopsy x 2   CESAREAN SECTION  03/30/2008   MASTECTOMY WITH AXILLARY LYMPH NODE DISSECTION Bilateral 07/27/2023   Procedure: BILATERAL MASTECTOMY WITH RIGHT AXILLARY LYMPH NODE DISSECTION;  Surgeon: [name], MD;  Location: [facility];  Service: General;  Laterality: Bilateral;  PEC BLOCK   PORTACATH PLACEMENT N/A 01/27/2023   Procedure: INSERTION PORT-A-CATH;  Surgeon: [name], MD;  Location: [facility];  Service: General;  Laterality: N/A;  leaving accessed   ROBOTIC ASSISTED TOTAL HYSTERECTOMY Bilateral 03/15/2019   Procedure: XI ROBOTIC ASSISTED TOTAL HYSTERECTOMY with Bilateral Salpingectomy;  Surgeon: Silverio Lay, MD;  Location: [facility];  Service: Gynecology;  Laterality: Bilateral;   TUBAL LIGATION  2017   Patient Active Problem List   Diagnosis Date Noted   Complication, postoperative infection 08/10/2023   Breast cancer of lower-inner quadrant of right female breast (HCC) 07/27/2023   Dysautonomia (HCC) 07/25/2023   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 [name]  REFERRING DIAG: Right breast cancer  THERAPY DIAG:  No diagnosis found.  Rationale for Evaluation and Treatment: Rehabilitation  ONSET DATE: 07/27/2023  SUBJECTIVE:                                                                                                                                                                                           SUBJECTIVE STATEMENT: Patient reports she underwent neoadjuvant chemotherapy from 01/28/2023 - 06/27/2023 followed by a bilateral mastectomy and right axillary lymph node dissection (8 of 20 nodes positive) on 07/27/2023. She is triple  negative and will undergo radiation on the right side. She had a breast infection and was hospitalized last week from 08/10/2023 - 08/11/2023.  PERTINENT HISTORY:  Patient was diagnosed on 01/07/2023 with right grade 3 invasive ductal carcinoma breast cancer. 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 She had a bilateral mastectomy and right axillary lymph node dissection (8 of 20 nodes positive) on 07/27/2023.  PATIENT GOALS:  Reassess how my recovery is going related to arm function, pain, and swelling.  PAIN:  Are you having pain? {OPRCPAIN:27236}  PRECAUTIONS: Recent Surgery, right UE Lymphedema risk  RED FLAGS: {PT Red Flags:29287}   ACTIVITY LEVEL / LEISURE: ***   OBJECTIVE:   PATIENT SURVEYS:  QUICK DASH: ***  OBSERVATIONS: ***  POSTURE:  ***  LYMPHEDEMA ASSESSMENT:   UPPER EXTREMITY AROM/PROM:   A/PROM RIGHT   eval   RIGHT 08/15/2023  Shoulder extension 53   Shoulder flexion 157   Shoulder abduction 159   Shoulder internal rotation 71   Shoulder external rotation 86                           (Blank rows = not tested)   A/PROM LEFT   eval LEFT 08/15/2023  Shoulder extension 43   Shoulder flexion 150   Shoulder  abduction 160   Shoulder internal rotation 72   Shoulder external rotation 76                           (Blank rows = not tested)   CERVICAL AROM: All within normal limits   UPPER EXTREMITY STRENGTH: WFL   LYMPHEDEMA ASSESSMENTS:    LANDMARK RIGHT   eval RIGHT 08/15/2023  10 cm proximal to olecranon process 28.5   Olecranon process 24.1   10 cm proximal to ulnar styloid process 23.6   Just proximal to ulnar styloid process 15.6   Across hand at thumb web space 17.7   At base of 2nd digit 5.9   (Blank rows = not tested)   LANDMARK LEFT   eval LEFT 08/15/2023  10 cm proximal to olecranon process 28.6   Olecranon process 23.8   10 cm proximal to ulnar styloid process 21.9   Just proximal to ulnar styloid process 15.4   Across hand at thumb web space 17.7   At base of 2nd digit 5.6   (Blank rows = not tested) Surgery type/Date: Bilateral mastectomy and right ALND 07/27/2023 Number of lymph nodes removed: 20 Current/past treatment (chemo, radiation, hormone therapy): Neoadjuvant chemotherapy Other symptoms:  Heaviness/tightness {yes/no:20286} Pain {yes/no:20286} Pitting edema {yes/no:20286} Infections {yes/no:20286} Decreased scar mobility {yes/no:20286} Stemmer sign {yes/no:20286}  PATIENT EDUCATION:  Education details: *** Person educated: {Person educated:25204} Education method: {Education Method:25205} Education comprehension: {Education Comprehension:25206}  HOME EXERCISE PROGRAM: Reviewed previously given post op HEP. ***  ASSESSMENT:  CLINICAL IMPRESSION: ***  Pt will benefit from skilled therapeutic intervention to improve on the following deficits: Decreased knowledge of precautions, impaired UE functional use, pain, decreased ROM, postural dysfunction.   PT treatment/interventions: ADL/Self care home management, {rehab planned interventions:25118::"97110-Therapeutic exercises","97530- Therapeutic [phone]- Neuromuscular re-education","97535-  Self TFTD","32202- Manual therapy"}   GOALS: Goals reviewed with patient? Yes  LONG TERM GOALS:  (STG=LTG)  GOALS Name Target Date  Goal status  1 Pt will demonstrate she has regained full shoulder ROM and function post operatively compared to baselines.  Baseline: 07/21/2023 INITIAL  2  *** INITIAL  3  ***  INITIAL  4  *** INITIAL     PLAN:  PT FREQUENCY/DURATION: ***  PLAN FOR NEXT SESSION: ***   [facility]  [address]  [place] [address]  [phone]  After Breast Cancer Class It is recommended you attend the ABC class to be educated on lymphedema risk reduction. This class is free of charge and lasts for 1 hour. It is a 1-time class. You will need to download the TEAMS app either on your phone or computer. We will send you a link the night before or the morning of the class. You should be able to click on that link to join the class. This is not a confidential class. You don't have to turn your camera on, but other participants may be able to see your email address.  Scar massage You can begin gentle scar massage to you incision sites. Gently place one hand on the incision and move the skin (without sliding on the skin) in various directions. Do this for a few minutes and then you can gently massage either coconut oil or vitamin E cream into the scars.  Compression garment You should continue wearing your compression bra until you feel like you no longer have swelling.  Home exercise Program Continue doing the exercises you were given until you feel like you can do them without feeling any tightness at the end.   Walking Program Studies show that 30 minutes of walking per day (fast enough to elevate your heart rate) can significantly reduce the risk of a cancer recurrence. If you can't walk due to other medical reasons, we encourage you to find another activity you could do (like a stationary bike or water  exercise).  Posture After breast cancer surgery, people frequently sit with rounded shoulders posture because it puts their incisions on slack and feels better. If you sit like this and scar tissue forms in that position, you can become very tight and have pain sitting or standing with good posture. Try to be aware of your posture and sit and stand up tall to heal properly.  Follow up PT: It is recommended you return every 3 months for the first 3 years following surgery to be assessed on the SOZO machine for an L-Dex score. This helps prevent clinically significant lymphedema in 95% of patients. These follow up screens are 10 minute appointments that you are not billed for.  Georgie Eduardo,MARTI COOPER, PT 08/13/2023, 2:23 PM

## 2023-08-14 ENCOUNTER — Other Ambulatory Visit: Payer: Self-pay

## 2023-08-15 ENCOUNTER — Ambulatory Visit: Payer: Managed Care, Other (non HMO) | Attending: General Surgery | Admitting: Physical Therapy

## 2023-08-15 ENCOUNTER — Encounter: Payer: Self-pay | Admitting: Physical Therapy

## 2023-08-15 DIAGNOSIS — R293 Abnormal posture: Secondary | ICD-10-CM | POA: Insufficient documentation

## 2023-08-15 DIAGNOSIS — C50311 Malignant neoplasm of lower-inner quadrant of right female breast: Secondary | ICD-10-CM | POA: Insufficient documentation

## 2023-08-15 DIAGNOSIS — Z483 Aftercare following surgery for neoplasm: Secondary | ICD-10-CM | POA: Diagnosis not present

## 2023-08-15 DIAGNOSIS — M25611 Stiffness of right shoulder, not elsewhere classified: Secondary | ICD-10-CM | POA: Diagnosis not present

## 2023-08-15 DIAGNOSIS — Z171 Estrogen receptor negative status [ER-]: Secondary | ICD-10-CM | POA: Diagnosis not present

## 2023-08-15 DIAGNOSIS — M25612 Stiffness of left shoulder, not elsewhere classified: Secondary | ICD-10-CM | POA: Diagnosis not present

## 2023-08-15 LAB — CULTURE, BLOOD (ROUTINE X 2)
Culture: NO GROWTH
Culture: NO GROWTH
Special Requests: ADEQUATE
Special Requests: ADEQUATE

## 2023-08-15 NOTE — Patient Instructions (Signed)
Wall Shoulder Press-Out    With palms flat on wall, shoulder-width apart, and elbows straight, press shoulders back. Return. Repeat __10__ times. Do __2__ sessions per day.  http://cc.exer.us/56   Copyright  VHI. All rights reserved.   [facility]  [address], [address]  [place] [address]  [phone]  After Breast Cancer Class It is recommended you attend the ABC class to be educated on lymphedema risk reduction. This class is free of charge and lasts for 1 hour. It is a 1-time class. You will need to download the TEAMS app either on your phone or computer. We will send you a link the night before or the morning of the class. You should be able to click on that link to join the class. This is not a confidential class. You don't have to turn your camera on, but other participants may be able to see your email address. You were given the info in writing since you can't attend the class.  Scar massage You can begin gentle scar massage to you incision sites. Gently place one hand on the incision and move the skin (without sliding on the skin) in various directions. Do this for a few minutes and then you can gently massage either coconut oil or vitamin E cream into the scars. YOU'LL BE READY TO BEGIN THIS WHEN YOUR STERI-STRIPS ARE OFF AND THE INCISION IS CLOSED.  Compression garment YOU DON'T NEED TO WEAR A GARMENT SINCE YOU DON'T HAVE SWELLING.  Home exercise Program Continue doing the exercises you were given until you feel like you can do them without feeling any tightness at the end.   Walking Program Studies show that 30 minutes of walking per day (fast enough to elevate your heart rate) can significantly reduce the risk of a cancer recurrence. If you can't walk due to other medical reasons, we encourage you to find another activity you could do (like a stationary bike or water exercise).  Posture After breast cancer surgery, people frequently sit with  rounded shoulders posture because it puts their incisions on slack and feels better. If you sit like this and scar tissue forms in that position, you can become very tight and have pain sitting or standing with good posture. Try to be aware of your posture and sit and stand up tall to heal properly.  Follow up PT: It is recommended you return every 3 months for the first 3 years following surgery to be assessed on the SOZO machine for an L-Dex score. This helps prevent clinically significant lymphedema in 95% of patients. These follow up screens are 10 minute appointments that you are not billed for.

## 2023-08-17 ENCOUNTER — Ambulatory Visit (HOSPITAL_COMMUNITY)
Admission: RE | Admit: 2023-08-17 | Discharge: 2023-08-17 | Disposition: A | Discharge status: Home / Self Care | Payer: Managed Care, Other (non HMO) | Source: Ambulatory Visit | Attending: Adult Health | Admitting: Adult Health

## 2023-08-17 DIAGNOSIS — Z171 Estrogen receptor negative status [ER-]: Secondary | ICD-10-CM | POA: Diagnosis present

## 2023-08-17 DIAGNOSIS — C50311 Malignant neoplasm of lower-inner quadrant of right female breast: Secondary | ICD-10-CM | POA: Insufficient documentation

## 2023-08-17 LAB — GLUCOSE, CAPILLARY: Glucose-Capillary: 93 mg/dL (ref 70–99)

## 2023-08-17 MED ORDER — FLUDEOXYGLUCOSE F - 18 (FDG) INJECTION
6.9500 | Freq: Once | INTRAVENOUS | Status: AC
  Administered 2023-08-17: 6.98 via INTRAVENOUS

## 2023-08-18 ENCOUNTER — Encounter: Payer: Self-pay | Admitting: Physical Therapy

## 2023-08-18 ENCOUNTER — Ambulatory Visit: Payer: Managed Care, Other (non HMO) | Admitting: Physical Therapy

## 2023-08-18 DIAGNOSIS — Z171 Estrogen receptor negative status [ER-]: Secondary | ICD-10-CM

## 2023-08-18 DIAGNOSIS — Z483 Aftercare following surgery for neoplasm: Secondary | ICD-10-CM

## 2023-08-18 DIAGNOSIS — M25612 Stiffness of left shoulder, not elsewhere classified: Secondary | ICD-10-CM

## 2023-08-18 DIAGNOSIS — R293 Abnormal posture: Secondary | ICD-10-CM

## 2023-08-18 DIAGNOSIS — C50311 Malignant neoplasm of lower-inner quadrant of right female breast: Secondary | ICD-10-CM | POA: Diagnosis not present

## 2023-08-18 DIAGNOSIS — M25611 Stiffness of right shoulder, not elsewhere classified: Secondary | ICD-10-CM

## 2023-08-18 NOTE — Therapy (Signed)
OUTPATIENT PHYSICAL THERAPY BREAST CANCER POST OP TREATMENT   Patient Name: Suzanne Tucker MRN: [id number] DOB:[birth year], 42 y.o., female Today's Date: 08/18/2023  END OF SESSION:  PT End of Session - 08/18/23 1140     Visit Number 3    Number of Visits 10    Date for PT Re-Evaluation 09/12/23    Authorization Type [employer]    PT Start Time 1104    PT Stop Time 1130   Patient had to leave early to return to work   PT Time Calculation (min) 26 min    Activity Tolerance Patient tolerated treatment well    Behavior During Therapy [facility] for tasks assessed/performed              Past Medical History:  Diagnosis Date   Anginal pain (HCC) 02/2019   related to MVP   Back pain    Breast cancer (HCC)    Depression    Dysrhythmia    MVP   Mitral prolapse    Sleep apnea    mild no cpap indicated   Past Surgical History:  Procedure Laterality Date   BREAST SURGERY Right 2024   Breast biopsy x 2   CESAREAN SECTION  03/30/2008   MASTECTOMY WITH AXILLARY LYMPH NODE DISSECTION Bilateral 07/27/2023   Procedure: BILATERAL MASTECTOMY WITH RIGHT AXILLARY LYMPH NODE DISSECTION;  Surgeon: [name], MD;  Location: [facility];  Service: General;  Laterality: Bilateral;  PEC BLOCK   PORTACATH PLACEMENT N/A 01/27/2023   Procedure: INSERTION PORT-A-CATH;  Surgeon: [name], MD;  Location: [facility];  Service: General;  Laterality: N/A;  leaving accessed   ROBOTIC ASSISTED TOTAL HYSTERECTOMY Bilateral 03/15/2019   Procedure: XI ROBOTIC ASSISTED TOTAL HYSTERECTOMY with Bilateral Salpingectomy;  Surgeon: Silverio Lay, MD;  Location: [facility];  Service: Gynecology;  Laterality: Bilateral;   TUBAL LIGATION  2017   Patient Active Problem List   Diagnosis Date Noted   Complication, postoperative infection 08/10/2023   Breast cancer of lower-inner quadrant of right female breast (HCC) 07/27/2023   Dysautonomia (HCC) 07/25/2023    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 [name]  REFERRING DIAG: Right breast cancer  THERAPY DIAG:  Abnormal posture  Stiffness of right shoulder, not elsewhere classified  Stiffness of left shoulder, not elsewhere classified  Malignant neoplasm of lower-inner quadrant of right breast of female, estrogen receptor negative (HCC)  Aftercare following surgery for neoplasm  Rationale for Evaluation and Treatment: Rehabilitation  ONSET DATE: 07/27/2023  SUBJECTIVE:  SUBJECTIVE STATEMENT: Patient reports she is doing well today. Pain is currently 1/10. She is having a lot of nausea from her PET Scan yesterday.  From eval: Patient reports she underwent neoadjuvant chemotherapy from 01/28/2023 - 06/27/2023 followed by a bilateral mastectomy and right axillary lymph node dissection (8 of 20 nodes positive) on 07/27/2023. She is triple negative and will undergo radiation on the right side. She is having a PET scan to ensure there are no metastases. She had a breast infection and was hospitalized last week from 08/10/2023 - 08/13/2023. She is on antibiotics until the end of this week.  PERTINENT HISTORY:  Patient was diagnosed on 01/07/2023 with right grade 3 invasive ductal carcinoma  breast cancer. 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 She had a bilateral mastectomy and right axillary lymph node dissection (8 of 20 nodes positive) on 07/27/2023.  PATIENT GOALS:  Reassess how my recovery is going related to arm function, pain, and swelling.  PAIN:  Are you having pain? Yes: NPRS scale: 2-3/10 Pain location: chest and sternum Pain description: burning and hypersensitive Aggravating factors: nighttime Relieving factors: Tylenol  PRECAUTIONS: Recent Surgery, right UE Lymphedema risk  RED FLAGS: None   ACTIVITY LEVEL / LEISURE: She has not returned to exercising   OBJECTIVE:  Treatment 08/18/2023 Supine shoulder PROM (flexion, abduction & Rt scapular mobility) B Pulleys (flexion/ abduction) x 2 mins each [facility] on wall x 20  PATIENT SURVEYS:  QUICK DASH:    OBSERVATIONS: Bilateral chest incisions appear to be healing well with steri-strips in place. There is no visible significant swelling present. No redness or signs of infection present. Hypersensitivity present in in chest and along midline at sternum.  POSTURE:  Forward head and rounded shoulders  LYMPHEDEMA ASSESSMENT:   UPPER EXTREMITY AROM/PROM:   A/PROM RIGHT   eval   RIGHT 08/15/2023  Shoulder extension 53 40  Shoulder flexion 157 113  Shoulder abduction 159 132  Shoulder internal rotation 71 74  Shoulder external rotation 86 75                          (Blank rows = not tested)   A/PROM LEFT   eval LEFT 08/15/2023  Shoulder extension 43 48  Shoulder flexion 150 138  Shoulder abduction 160 144  Shoulder internal rotation 72 70  Shoulder external rotation 76 80                          (Blank rows = not tested)   CERVICAL AROM: All within normal limits   UPPER EXTREMITY STRENGTH: WFL   LYMPHEDEMA ASSESSMENTS:    LANDMARK RIGHT   eval RIGHT 08/15/2023  10 cm proximal to olecranon process 28.5 27.2  Olecranon process 24.1 23.1  10 cm proximal to ulnar styloid  process 23.6 21.7  Just proximal to ulnar styloid process 15.6 15  Across hand at thumb web space 17.7 18  At base of 2nd digit 5.9 5.8  (Blank rows = not tested)   LANDMARK LEFT   eval LEFT 08/15/2023  10 cm proximal to olecranon process 28.6 26.8  Olecranon process 23.8 23.3  10 cm proximal to ulnar styloid process 21.9 21.7  Just proximal to ulnar styloid process 15.4 14.6  Across hand at thumb web space 17.7 17.2  At base of 2nd digit 5.6 5.5  (Blank rows = not tested)  Surgery type/Date: Bilateral mastectomy and right  ALND 07/27/2023 Number of lymph nodes removed: 20 Current/past treatment (chemo, radiation, hormone therapy): Neoadjuvant chemotherapy Other symptoms:  Heaviness/tightness Yes Pain Yes Pitting edema No Infections Yes Decreased scar mobility Yes Stemmer sign No  PATIENT EDUCATION:  Education details: post op HEP (bil AAROM flexion with hands clasped together, scapular retraction, ER with hands behind head, and shoulder abduction crawling up wall) and wall scapular protraction push offs Person educated: Patient Education method: Explanation, Demonstration, Tactile cues, Verbal cues, and Handouts Education comprehension: verbalized understanding and returned demonstration  HOME EXERCISE PROGRAM: Reviewed previously given post op HEP.  ASSESSMENT:  CLINICAL IMPRESSION: Today's treatment session focused on bilaterally shoulder and scapular mobility. Patient verbalized compliance with HEP and did not have any questions/concerns. Patient verbalized relief with Rt scapular mobilizations. Patient required verbal and tactile cues for correct exercise performance. Patient will benefit from skilled PT to address the below impairments and improve overall function.   Pt will benefit from skilled therapeutic intervention to improve on the following deficits: Decreased knowledge of precautions, impaired UE functional use, pain, decreased ROM, postural dysfunction.   PT  treatment/interventions: ADL/Self care home management, [phone]- PT Re-evaluation, 97110-Therapeutic exercises, 97530- Therapeutic activity, 97535- Self Care, 29562- Manual therapy, Patient/Family education, Dry Needling, Manual lymph drainage, and Scar mobilization   GOALS: Goals reviewed with patient? Yes  LONG TERM GOALS:  (STG=LTG)  GOALS Name Target Date  Goal status  1 Pt will demonstrate she has regained shoulder active flexion and abduction ROM back to baseline bilaterally (>/= 150 flexion and >/= 160 abduction)  09/12/2023 INITIAL  2 Patient will report >/= 50% reduction in skin hypersensitivity for improved tolerance to wearing shirts and bras. 09/12/2023 INITIAL  3 Patient will verbalize good understanding of self scar massage and lymphedema risk reduction practices. 09/12/2023 INITIAL  4 Patient will improve her DASH score to be back to zero (baseline) for improved UE function. 09/12/2023 INITIAL     PLAN:  PT FREQUENCY/DURATION: 2x/week for 4 weeks  PLAN FOR NEXT SESSION: Continue shoulder PROM, Rt scapular mobility   [facility]  [address], [address]  [place] [address]  [phone]  After Breast Cancer Class It is recommended you attend the ABC class to be educated on lymphedema risk reduction. This class is free of charge and lasts for 1 hour. It is a 1-time class. You will need to download the TEAMS app either on your phone or computer. We will send you a link the night before or the morning of the class. You should be able to click on that link to join the class. This is not a confidential class. You don't have to turn your camera on, but other participants may be able to see your email address. You were given the info in writing since you can't attend the class.  Scar massage You can begin gentle scar massage to you incision sites. Gently place one hand on the incision and move the skin (without sliding on the skin) in various directions. Do  this for a few minutes and then you can gently massage either coconut oil or vitamin E cream into the scars. YOU'LL BE READY TO BEGIN THIS WHEN YOUR STERI-STRIPS ARE OFF AND THE INCISION IS CLOSED.  Compression garment YOU DON'T NEED TO WEAR A GARMENT SINCE YOU DON'T HAVE SWELLING.  Home exercise Program Continue doing the exercises you were given until you feel like you can do them without feeling any tightness at the end.   Walking  Program Studies show that 30 minutes of walking per day (fast enough to elevate your heart rate) can significantly reduce the risk of a cancer recurrence. If you can't walk due to other medical reasons, we encourage you to find another activity you could do (like a stationary bike or water exercise).  Posture After breast cancer surgery, people frequently sit with rounded shoulders posture because it puts their incisions on slack and feels better. If you sit like this and scar tissue forms in that position, you can become very tight and have pain sitting or standing with good posture. Try to be aware of your posture and sit and stand up tall to heal properly.  Follow up PT: It is recommended you return every 3 months for the first 3 years following surgery to be assessed on the SOZO machine for an L-Dex score. This helps prevent clinically significant lymphedema in 95% of patients. These follow up screens are 10 minute appointments that you are not billed for.  Bethann Punches, PT 08/18/23 11:41 AM

## 2023-08-19 ENCOUNTER — Other Ambulatory Visit: Payer: Self-pay

## 2023-08-22 ENCOUNTER — Ambulatory Visit: Payer: Managed Care, Other (non HMO) | Admitting: Physical Therapy

## 2023-08-22 ENCOUNTER — Encounter: Payer: Self-pay | Admitting: Physical Therapy

## 2023-08-22 DIAGNOSIS — C50311 Malignant neoplasm of lower-inner quadrant of right female breast: Secondary | ICD-10-CM

## 2023-08-22 DIAGNOSIS — M25611 Stiffness of right shoulder, not elsewhere classified: Secondary | ICD-10-CM

## 2023-08-22 DIAGNOSIS — Z483 Aftercare following surgery for neoplasm: Secondary | ICD-10-CM

## 2023-08-22 DIAGNOSIS — M25612 Stiffness of left shoulder, not elsewhere classified: Secondary | ICD-10-CM

## 2023-08-22 DIAGNOSIS — R293 Abnormal posture: Secondary | ICD-10-CM

## 2023-08-22 NOTE — Therapy (Signed)
OUTPATIENT PHYSICAL THERAPY BREAST CANCER POST OP TREATMENT   Patient Name: Suzanne Tucker MRN: [id number] DOB:10/10/1980, 42 y.o., female Today's Date: 08/22/2023  END OF SESSION:  PT End of Session - 08/22/23 1136     Visit Number 4    Number of Visits 10    Date for PT Re-Evaluation 09/12/23    Authorization Type [employer]    PT Start Time 1101    PT Stop Time 1130   patient had to leave early to return to work   PT Time Calculation (min) 29 min    Activity Tolerance Patient tolerated treatment well    Behavior During Therapy [facility] for tasks assessed/performed               Past Medical History:  Diagnosis Date   Anginal pain (HCC) 02/2019   related to MVP   Back pain    Breast cancer (HCC)    Depression    Dysrhythmia    MVP   Mitral prolapse    Sleep apnea    mild no cpap indicated   Past Surgical History:  Procedure Laterality Date   BREAST SURGERY Right 2024   Breast biopsy x 2   CESAREAN SECTION  03/30/2008   MASTECTOMY WITH AXILLARY LYMPH NODE DISSECTION Bilateral 07/27/2023   Procedure: BILATERAL MASTECTOMY WITH RIGHT AXILLARY LYMPH NODE DISSECTION;  Surgeon: [name], MD;  Location: [facility];  Service: General;  Laterality: Bilateral;  PEC BLOCK   PORTACATH PLACEMENT N/A 01/27/2023   Procedure: INSERTION PORT-A-CATH;  Surgeon: [name], MD;  Location: [facility];  Service: General;  Laterality: N/A;  leaving accessed   ROBOTIC ASSISTED TOTAL HYSTERECTOMY Bilateral 03/15/2019   Procedure: XI ROBOTIC ASSISTED TOTAL HYSTERECTOMY with Bilateral Salpingectomy;  Surgeon: Silverio Lay, MD;  Location: [facility];  Service: Gynecology;  Laterality: Bilateral;   TUBAL LIGATION  2017   Patient Active Problem List   Diagnosis Date Noted   Complication, postoperative infection 08/10/2023   Breast cancer of lower-inner quadrant of right female breast (HCC) 07/27/2023   Dysautonomia (HCC) 07/25/2023    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 [name]  REFERRING DIAG: Right breast cancer  THERAPY DIAG:  Abnormal posture  Stiffness of right shoulder, not elsewhere classified  Stiffness of left shoulder, not elsewhere classified  Malignant neoplasm of lower-inner quadrant of right breast of female, estrogen receptor negative (HCC)  Aftercare following surgery for neoplasm  Rationale for Evaluation and Treatment: Rehabilitation  ONSET DATE: 07/27/2023  SUBJECTIVE:  SUBJECTIVE STATEMENT: Patient reports she is doing good today. She is currently having back (4/10) and rib pain (3/10) currently  From eval: Patient reports she underwent neoadjuvant chemotherapy from 01/28/2023 - 06/27/2023 followed by a bilateral mastectomy and right axillary lymph node dissection (8 of 20 nodes positive) on 07/27/2023. She is triple negative and will undergo radiation on the right side. She is having a PET scan to ensure there are no metastases. She had a breast infection and was hospitalized last week from 08/10/2023 - 08/13/2023. She is on antibiotics until the end of this week.  PERTINENT HISTORY:  Patient was diagnosed on 01/07/2023 with right grade 3 invasive ductal carcinoma breast cancer. It  is triple negative with a Ki67 of 95%. She had a bilateral mastectomy and right axillary lymph node dissection (8 of 20 nodes positive) on 07/27/2023.  PATIENT GOALS:  Reassess how my recovery is going related to arm function, pain, and swelling.  PAIN:  Are you having pain? Yes: NPRS scale: 2-3/10 Pain location: chest and sternum Pain description: burning and hypersensitive Aggravating factors: nighttime Relieving factors: Tylenol  PRECAUTIONS: Recent Surgery, right UE Lymphedema risk  RED FLAGS: None   ACTIVITY LEVEL / LEISURE: She has not returned to exercising   OBJECTIVE:  Today's Treatment 08/22/2023 Supine shoulder PROM (flexion, abduction & Rt scapular mobility) bilateral x 15 min Pulleys (flexion/ abduction) x 2 mins each [facility] on wall x 20 Seated SB stretch  3 way x 8 each direction Standing bow and arrow stretch x 8 each   08/18/2023 Supine shoulder PROM (flexion, abduction & Rt scapular mobility) B Pulleys (flexion/ abduction) x 2 mins each Ball Rolls on wall x 20  PATIENT SURVEYS:  QUICK DASH:    OBSERVATIONS: Bilateral chest incisions appear to be healing well with steri-strips in place. There is no visible significant swelling present. No redness or signs of infection present. Hypersensitivity present in in chest and along midline at sternum.  POSTURE:  Forward head and rounded shoulders  LYMPHEDEMA ASSESSMENT:   UPPER EXTREMITY AROM/PROM:   A/PROM RIGHT   eval   RIGHT 08/15/2023  Shoulder extension 53 40  Shoulder flexion 157 113  Shoulder abduction 159 132  Shoulder internal rotation 71 74  Shoulder external rotation 86 75                          (Blank rows = not tested)   A/PROM LEFT   eval LEFT 08/15/2023  Shoulder extension 43 48  Shoulder flexion 150 138  Shoulder abduction 160 144  Shoulder internal rotation 72 70  Shoulder external rotation 76 80                          (Blank rows = not tested)   CERVICAL AROM: All  within normal limits   UPPER EXTREMITY STRENGTH: WFL   LYMPHEDEMA ASSESSMENTS:    LANDMARK RIGHT   eval RIGHT 08/15/2023  10 cm proximal to olecranon process 28.5 27.2  Olecranon process 24.1 23.1  10 cm proximal to ulnar styloid process 23.6 21.7  Just proximal to ulnar styloid process 15.6 15  Across hand at thumb web space 17.7 18  At base of 2nd digit 5.9 5.8  (Blank rows = not tested)   LANDMARK LEFT   eval LEFT 08/15/2023  10 cm proximal to olecranon process 28.6 26.8  Olecranon process 23.8 23.3  10 cm proximal to ulnar  styloid process 21.9 21.7  Just proximal to ulnar styloid process 15.4 14.6  Across hand at thumb web space 17.7 17.2  At base of 2nd digit 5.6 5.5  (Blank rows = not tested)  Surgery type/Date: Bilateral mastectomy and right ALND 07/27/2023 Number of lymph nodes removed: 20 Current/past treatment (chemo, radiation, hormone therapy): Neoadjuvant chemotherapy Other symptoms:  Heaviness/tightness Yes Pain Yes Pitting edema No Infections Yes Decreased scar mobility Yes Stemmer sign No  PATIENT EDUCATION:  Education details: post op HEP (bil AAROM flexion with hands clasped together, scapular retraction, ER with hands behind head, and shoulder abduction crawling up wall) and wall scapular protraction push offs Person educated: Patient Education method: Explanation, Demonstration, Tactile cues, Verbal cues, and Handouts Education comprehension: verbalized understanding and returned demonstration  HOME EXERCISE PROGRAM: Reviewed previously given post op HEP.  ASSESSMENT:  CLINICAL IMPRESSION: Tonika presented to therapy today experiencing increased pain in her ribs and back today. Today's treatment session focused on bilaterally shoulder and scapular mobility. Patient has been semi- compliant with HEP. She tolerated treatment session well and verbalized feeling a stretch with exercises. She required verbal and visual cues for correct exercise  performance. Patient enjoyed doing standing bow and arrow stretch. Patient will benefit from skilled PT to address the below impairments and improve overall function.    Pt will benefit from skilled therapeutic intervention to improve on the following deficits: Decreased knowledge of precautions, impaired UE functional use, pain, decreased ROM, postural dysfunction.   PT treatment/interventions: ADL/Self care home management, [phone]- PT Re-evaluation, 97110-Therapeutic exercises, 97530- Therapeutic activity, 97535- Self Care, [id number]- Manual therapy, Patient/Family education, Dry Needling, Manual lymph drainage, and Scar mobilization   GOALS: Goals reviewed with patient? Yes  LONG TERM GOALS:  (STG=LTG)  GOALS Name Target Date  Goal status  1 Pt will demonstrate she has regained shoulder active flexion and abduction ROM back to baseline bilaterally (>/= 150 flexion and >/= 160 abduction)  09/12/2023 INITIAL  2 Patient will report >/= 50% reduction in skin hypersensitivity for improved tolerance to wearing shirts and bras. 09/12/2023 INITIAL  3 Patient will verbalize good understanding of self scar massage and lymphedema risk reduction practices. 09/12/2023 INITIAL  4 Patient will improve her DASH score to be back to zero (baseline) for improved UE function. 09/12/2023 INITIAL     PLAN:  PT FREQUENCY/DURATION: 2x/week for 4 weeks  PLAN FOR NEXT SESSION: Continue shoulder PROM, Rt scapular mobility   [facility]  [address], [address]  [place] [address]  [phone]  After Breast Cancer Class It is recommended you attend the ABC class to be educated on lymphedema risk reduction. This class is free of charge and lasts for 1 hour. It is a 1-time class. You will need to download the TEAMS app either on your phone or computer. We will send you a link the night before or the morning of the class. You should be able to click on that link to join the class. This is not  a confidential class. You don't have to turn your camera on, but other participants may be able to see your email address. You were given the info in writing since you can't attend the class.  Scar massage You can begin gentle scar massage to you incision sites. Gently place one hand on the incision and move the skin (without sliding on the skin) in various directions. Do this for a few minutes and then you can gently massage either coconut oil  or vitamin E cream into the scars. YOU'LL BE READY TO BEGIN THIS WHEN YOUR STERI-STRIPS ARE OFF AND THE INCISION IS CLOSED.  Compression garment YOU DON'T NEED TO WEAR A GARMENT SINCE YOU DON'T HAVE SWELLING.  Home exercise Program Continue doing the exercises you were given until you feel like you can do them without feeling any tightness at the end.   Walking Program Studies show that 30 minutes of walking per day (fast enough to elevate your heart rate) can significantly reduce the risk of a cancer recurrence. If you can't walk due to other medical reasons, we encourage you to find another activity you could do (like a stationary bike or water exercise).  Posture After breast cancer surgery, people frequently sit with rounded shoulders posture because it puts their incisions on slack and feels better. If you sit like this and scar tissue forms in that position, you can become very tight and have pain sitting or standing with good posture. Try to be aware of your posture and sit and stand up tall to heal properly.  Follow up PT: It is recommended you return Tucker 3 months for the first 3 years following surgery to be assessed on the SOZO machine for an L-Dex score. This helps prevent clinically significant lymphedema in 95% of patients. These follow up screens are 10 minute appointments that you are not billed for.  Bethann Punches, [place] 08/22/23 11:37 AM

## 2023-08-23 NOTE — Progress Notes (Signed)
Radiation Oncology         [phone] ________________________________  Name: Suzanne Tucker MRN: [id number]  Date: 08/24/2023  DOB: 1981-06-27  Follow-Up Visit Note  Outpatient  CC: Serena Croissant, MD  Serena Croissant, MD  Diagnosis:      ICD-10-CM   1. 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     On her breast clinic consultation date of 01/19/23, she underwent genetic testing. Results showed no clinically significant variants detected by [facility] genetic testing.  She also presented for a bilateral breast MRI on her consultation date which demonstrated: the biopsy proven malignancy in the lower inner right breast measuring 2.2 cm; an additional linear non mass enhancement extending posterior to the dominant mass measuring 2.1 cm; and bulky right axillary lymphadenopathy involving levels 1-3. MRI otherwise showed no evidence of malignancy in the left breast.   Since her consultation date, she underwent further staging work up consisting of an MRI of the brain on 01/22/23 which showed no evidence of intracranial metastatic disease. A PET scan was also performed on 01/25/23 which showed no signs of tracer avid distant metastatic disease. PET also showed evidence of cervical, right axillary, and retropectoral metastatic lymphadenopathy.   Systemic therapy, if applicable, involved (dates and therapy as follows): She has been treated with neoadjuvant chemotherapy with weekly  Taxol and carboplatin with Keytruda (given every 3 weeks) x 12 cycles from 01/28/23 through 04/15/23, followed by Adriamycin, Cytoxan, and Keytruda from 04/22/23 through 09/24 under the care of Dr. [name]. After her first cycle of Adriamycin and Cytoxan, she had significant pancytopenia and febrile neutropenia requiring her to be hospitalized from 04/29/2023-05/06/2023 and transfused. Her dosing was subsequently reduced starting with cycle 2. She otherwise tolerated the remainder of her chemotherapy relatively well other than fatigue and chemo induced anemia, and began maintenance Keytruda on 07/15/23.   Bilateral breast MRI s/p neoadjuvant chemotherapy on 06/23/23 showed no evidence of residual enhancing mass in the lower inner right breast at the biopsy-proven site of malignancy, no abnormal appearing right axillary lymph nodes, and no evidence of malignancy in the left breast.    She opted to proceed with bilateral mastectomies and right axillary SLN evaluation on 07/27/23 under the care of Dr. Donell Beers.  Pathology from the procedure revealed:  -- Right breast: no residual carcinoma in the breast s/p neoadjuvant chemotherapy, but with 08/21 excised right axillary sentinel lymph nodes positive for metastatic carcinoma (largest metastatic focus measuring 16 mm), two of which are positive for extranodal extension measuring 10 mm and 1.5 mm; positive for LVI. ER status negative; PR status negative; Her2 status negative  -- Left breast: negative for malignancy  Post-operatively, she developed an infection at the surgical site and was placed on oral abx. Despite abx, she developed a fever, tachycardia, and purulent drainage from her previous drain site and was subsequently hospitalized from 08/10/23 through 08/17/23. Hospital course consisted of IV antibiotics with improvement achieved prior to discharge. A chest CT was also performed  during her hospitalization which showed: postsurgical changes s/p bilateral  mastectomies, a likely residual hematoma along the left mastectomy site; and minimal subcutaneous gas in the right chest wall and axilla likely related to recent surgical drain removal. There was no definite evidence of an abscess demonstrated.   Based on her positive nodal status on final pathology, Dr. [name] may consider adding capecitabine (oral chemotherapy) post-radiation to reduce her risk of recurrence. In the mean time, she will continue with maintenance Keytruda.   She also recently presented for a restaging PET scan on 08/17/23 (due to her positive nodes s/p neoadjuvant chemo) which demonstrated: interval resection of multiple hypermetabolic right axillary and sub pectoralis metastatic lymph nodes, mild metabolic activity at the resection site favoring benign inflammatory activity, and mild indeterminate metabolic activity slightly removed from the resection site beneath the right pectoralis muscle, with differential considerations including residual reactive adenopathy vs central metastatic adenopathy. PET otherwise demonstrated no evidence of metastatic adenopathy in the chest, abdomen, or pelvis, and no evidence of visceral metastasis or skeletal metastasis. (Other notable findings included interval resolution of hypermetabolic left adnexal activity).   Symptomatically, the patient reports: ***        ALLERGIES:  is allergic to morphine; tape; tessalon [benzonatate]; hydrocodone; latex; antihistamines, loratadine-type; codeine; kiwi extract; oxycontin [oxycodone]; and tramadol.  Meds: Current Outpatient Medications  Medication Sig Dispense Refill   acetaminophen (TYLENOL) 500 MG tablet Take 1,000 mg by mouth every 6 (six) hours as needed for fever or mild pain (pain score 1-3).     calcium carbonate (TUMS - DOSED IN MG ELEMENTAL CALCIUM) 500 MG chewable tablet Chew 1,000 mg by mouth as needed for indigestion or heartburn.     doxycycline (VIBRAMYCIN) 100 MG capsule Take 1 capsule (100 mg  total) by mouth 2 (two) times daily. One po bid x 7 days 14 capsule 0   fluticasone (FLONASE) 50 MCG/ACT nasal spray Place 2 sprays into both nostrils daily. (Patient taking differently: Place 2 sprays into both nostrils daily as needed for allergies or rhinitis.) 16 g 0   ibuprofen (ADVIL) 600 MG tablet Take 1 tablet (600 mg total) by mouth every 6 (six) hours as needed for mild pain (pain score 1-3), moderate pain (pain score 4-6) or headache. (Patient not taking: Reported on 08/10/2023) 60 tablet 1   Ibuprofen 200 MG CAPS Take 200-400 mg by mouth every 6 (six) hours as needed (for mild pain or fever).     methocarbamol (ROBAXIN) 500 MG tablet Take 1 tablet (500 mg total) by mouth every 6 (six) hours as needed for muscle spasms. (Patient not taking: Reported on 08/10/2023) 10 tablet 0   ondansetron (ZOFRAN) 8 MG tablet Take 1 tablet (8 mg total) by mouth every 8 (eight) hours as needed for nausea or vomiting. Start on the third day after chemotherapy. 30 tablet 1   No current facility-administered medications for this encounter.    Physical Findings:  vitals were not taken for this visit. .     General: Alert and oriented, in no acute distress HEENT: Head is normocephalic. Extraocular movements are intact. Oropharynx is clear. Neck: Neck is supple, no palpable cervical or supraclavicular lymphadenopathy. Heart: Regular in rate and rhythm with no murmurs, rubs, or gallops. Chest: Clear to auscultation bilaterally, with no rhonchi, wheezes, or rales. Abdomen: Soft, nontender, nondistended, with no rigidity or guarding. Extremities: No cyanosis or edema. Lymphatics: see Neck Exam Musculoskeletal: symmetric strength and muscle tone throughout. Neurologic: No obvious focalities. Speech is fluent.  Psychiatric: Judgment and insight are intact. Affect is appropriate. Breast exam reveals ***  Lab Findings: Lab Results  Component Value Date   WBC 10.0 08/11/2023   HGB 9.7 (L) 08/11/2023   HCT  30.5 (L) 08/11/2023   MCV 98.1 08/11/2023   PLT 218 08/11/2023    @LASTCHEMISTRY @  Radiographic Findings: NM PET Image Restag (PS) Skull Base To Thigh  Result Date: 08/19/2023 CLINICAL DATA:  Subsequent treatment strategy for breast carcinoma. EXAM: NUCLEAR MEDICINE PET SKULL BASE TO THIGH TECHNIQUE: 6.9 mCi F-18 FDG was injected intravenously. Full-ring PET imaging was performed from the skull base to thigh after the radiotracer. CT data was obtained and used for attenuation correction and anatomic localization. Fasting blood glucose: 93 mg/dl COMPARISON:  PET-CT scan 01/25/2023 FINDINGS: Mediastinal blood pool activity: SUV max Liver activity: SUV max NA NECK: No hypermetabolic lymph nodes in the neck. Incidental CT findings: None. CHEST: Interval resection multiple hypermetabolic RIGHT axillary and sub pectoralis metastatic lymph nodes. There is mild metabolic activity at the resection site adjacent to the resection clips which is favored benign physiologic inflammatory activity (SUV max equal 3.9 on image 63). \ There is 1 more medial focus slightly removed from the resection site beneath the RIGHT pectoralis muscle with mild metabolic activity (SUV max equal 3.8). No discrete measurable node at this site (image 54). No central hypermetabolic thoracic nodes. No internal mammary nodes. No suspicious nodules on CT portion exam. Incidental CT findings: Port in the anterior chest wall with tip in distal SVC. ABDOMEN/PELVIS: No abnormal hypermetabolic activity within the liver, pancreas, adrenal glands, or spleen. No hypermetabolic lymph nodes in the abdomen or pelvis. Interval resolution of previous seen hypermetabolic lesion in the LEFT adnexa. Hypermetabolic abnormal activity in the pelvis. Small LEFT ovary at site of enlarged round ovoid lesion measures 7 mm compared to 22 mm (image 159/series 4) favor resolution of benign ovarian activity. Incidental CT findings: None. SKELETON: No focal  hypermetabolic activity to suggest skeletal metastasis. Incidental CT findings: None. IMPRESSION: 1. Interval resection of multiple hypermetabolic RIGHT axillary and sub pectoralis metastatic lymph nodes. 2. Mild metabolic activity at the resection site is favored benign inflammatory activity. 3. Mild metabolic activity slightly removed from the resection site beneath the RIGHT pectoralis muscle is indeterminate. Probable residual reactive adenopathy versus central metastatic adenopathy 4. No evidence of metastatic adenopathy in the chest, abdomen, or pelvis. 5. No evidence of visceral metastasis or skeletal metastasis. 6. Interval resolution of hypermetabolic LEFT adnexal activity. Electronically Signed   By: Genevive Bi M.D.   On: 08/19/2023 14:51   CT CHEST WO CONTRAST  Result Date: 08/10/2023 CLINICAL DATA:  Bilateral mastectomy, chest wall mass EXAM: CT CHEST WITHOUT CONTRAST TECHNIQUE: Multidetector CT imaging of the chest was performed following the standard protocol without IV contrast. RADIATION DOSE REDUCTION: This exam was performed according to the departmental dose-optimization program which includes automated exposure control, adjustment of the mA and/or kV according to patient size and/or use of iterative reconstruction technique. COMPARISON:  08/10/2023, 01/30/2023 FINDINGS: Cardiovascular: Unenhanced imaging of the heart is unremarkable without pericardial effusion. Normal caliber of the thoracic aorta. Left chest wall port via subclavian approach, tip within the superior vena cava. Evaluation of the vascular lumen is limited without IV contrast. Mediastinum/Nodes: No enlarged mediastinal or axillary lymph nodes. Thyroid gland, trachea, and esophagus demonstrate no significant findings. Lungs/Pleura: No acute airspace disease, effusion, or pneumothorax. Central airways are patent. Upper Abdomen: No acute abnormality. Musculoskeletal: Postsurgical changes are seen from bilateral  mastectomies. Slightly hyperdense fluid within the left mastectomy site, measuring up to 1 cm in thickness, likely representing postoperative hematoma. No evidence of abscess on this limited unenhanced exam. Small amount of subcutaneous gas and fluid are seen along the right chest wall extending to the right axilla, likely related to prior surgical drain. Multiple lymphadenectomy clips are seen within the right axilla. There are no acute or destructive bony abnormalities. Reconstructed images demonstrate no additional findings. IMPRESSION: 1. Postsurgical changes from bilateral mastectomies, with likely residual hematoma along the left mastectomy site. Minimal subcutaneous gas in the right chest wall and axilla likely related to recent surgical drain removal. No evidence of abscess on this limited unenhanced exam. 2. No acute intrathoracic process. Electronically Signed   By: Sharlet Salina M.D.   On: 08/10/2023 20:52   DG Chest Port 1 View  Result Date: 08/10/2023 CLINICAL DATA:  Fever, tachycardia, recent history of double mastectomy EXAM: PORTABLE CHEST 1 VIEW COMPARISON:  04/29/2023 FINDINGS: Single frontal view of the chest demonstrates stable left chest wall port. The cardiac silhouette is unremarkable. No acute airspace disease, effusion, or pneumothorax. Postsurgical changes from bilateral mastectomy and right axillary lymph node dissection. No acute bony abnormalities. IMPRESSION: 1. No acute intrathoracic process. Electronically Signed   By: Sharlet Salina M.D.   On: 08/10/2023 19:29    Impression/Plan: We discussed adjuvant radiotherapy today.  I recommend *** in order to ***.  I reviewed the logistics, benefits, risks, and potential side effects of this treatment in detail. Risks may include but not necessary be limited to acute and late injury tissue in the radiation fields such as skin irritation (change in color/pigmentation, itching, dryness, pain, peeling). She may experience fatigue. We also  discussed possible risk of long term cosmetic changes or scar tissue. There is also a smaller risk for lung toxicity, ***cardiac toxicity, ***brachial plexopathy, ***lymphedema, ***musculoskeletal changes, ***rib fragility or ***induction of a second malignancy, ***late chronic non-healing soft tissue wound.    The patient asked good questions which I answered to her satisfaction. She is enthusiastic about proceeding with treatment. A consent form has been *** signed and placed in her chart.  A total of *** medically necessary complex treatment devices will be fabricated and supervised by me: *** fields with MLCs for custom blocks to protect heart, and lungs;  and, a Vac-lok. MORE COMPLEX DEVICES MAY BE MADE IN DOSIMETRY FOR FIELD IN FIELD BEAMS FOR DOSE HOMOGENEITY.  I have requested : 3D Simulation which is medically necessary to give adequate dose to at risk tissues while sparing lungs and heart.  I have requested a DVH of the following structures: lungs, heart, *** lumpectomy cavity.    The patient will receive *** Gy in *** fractions to the *** with *** fields.  This will be *** followed by a boost.  On date of service, in total, I spent *** minutes on this encounter. Patient was seen in person.  _____________________________________   Lonie Peak, MD  This document serves as a record of services personally performed by Lonie Peak, MD. It was created on her behalf by Neena Rhymes, a trained medical scribe. The creation of this record is based on the scribe's personal observations and the provider's statements to them. This document has been checked and approved by the attending provider.

## 2023-08-23 NOTE — Progress Notes (Signed)
Location of Breast Cancer: Malignant neoplasm of lower-inner quadrant of right breast of female, estrogen receptor negative  Breast cancer metastasized to axillary lymph node, right   Histology per Pathology Report: 07/27/23  SURGICAL PATHOLOGY THIS IS AN ADDENDUM REPORt CASE: [id number] PATIENT: Suzanne Tucker Surgical Pathology Report Addendum   Reason for Addendum #1:  Breast Biomarker Results  Clinical History: right breast cancer, high risk (cm)     FINAL MICROSCOPIC DIAGNOSIS:  A. BREAST, LEFT, MASTECTOMY:      Benign breast tissue with fibrocystic changes including stromal fibrosis, adenosis and microcysts.      Negative for atypia or malignancy.  B. BREAST, RIGHT, MASTECTOMY:      Benign breast tissue with fibrocystic changes including stromal fibrosis, adenosis and microcysts and treatment effect.      Biopsy site changes and biopsy clip identified.      No evidence of residual carcinoma.      One lymph node, negative for metastatic carcinoma (0/1).      See oncology table.  C. LYMPH NODE, RIGHT AXILLARY, CONTENTS:      Eight out of twenty lymph nodes, positive for metastatic carcinoma (8/20).      Lymphovascular invasion identified.      Largest metastatic focus: 16 mm.      Extranodal extension identified in two lymph nodes.           Size of extranodal extension: 10 mm and 1.5 mm.  ONCOLOGY TABLE:  INVASIVE CARCINOMA OF THE BREAST, STATUS POST NEOADJUVANT TREATMENT: Resection  Procedure: Mastectomy Specimen Laterality: Left Histologic Type: Invasive ductal carcinoma Histologic Grade:      Glandular (Acinar)/Tubular Differentiation: 3      Nuclear Pleomorphism: 3      Mitotic Rate: 3      Overall Grade: 3 Tumor Size: Not residual tumor in the breast, however multiple lymph nodes with metastasis Ductal Carcinoma In Situ: Not identified Tumor Extent: Limited to breast parenchyma Treatment Effect in the Breast: No residual invasive carcinoma  is present in the breast after presurgical therapy Treatment Effect in Lymph Nodes: No definite response to presurgical therapy in metastatic carcinoma Margins: All margins negative for invasive carcinoma      Distance from Closest Margin (mm): NA      Specify Closest Margin (required only if <49mm): NA DCIS Margins: NA      Distance from Closest Margin (mm): NA      Specify Closest Margin (required only if <62mm): NA Regional Lymph Nodes:      Number of Lymph Nodes Examined: 21      Number of Sentinel Nodes Examined: 21      Number of Lymph Nodes with Macrometastases (>2 mm): 5      Number of Lymph Nodes with Micrometastases: 3      Number of Lymph Nodes with Isolated Tumor Cells (=0.2 mm or =200 cells): 0      Size of Largest Metastatic Deposit (millimeters): 16 mm      Extranodal Extension: Identified in two lymph nodes Distant Metastasis:      Distant Site(s) Involved: Not applicable Breast Prognostic Profile (Pre-neoadjuvant case #: [id number])      Estrogen Receptor: 0%, negative      Progesterone Receptor: 0%, negative      Her2: Negative, 1+      Ki-67: 95%      Will be repeated on the current case (Block #: C11) and the results reported in an addendum. Residual Cancer Burden (RCB):  Primary Tumor Bed: 0 mm x 0 mm      Overall Cancer Cellularity: 0      Percentage of Cancer that is in Situ: 0      Number of Positive Lymph Nodes: 8      Diameter of Largest Lymph Node metastasis: 16 mm      Residual Cancer Burden: 1.992      Residual Cancer Burden Class: RCB-II Pathologic Stage Classification (pTNM, AJCC 8th Edition): ypT0, ypN2a Representative Tumor Block: C11 Comment(s): Immunohistochemical stain for CK7 was performed on B13 and does not show evidence of metastatic carcinoma. (v4.5.0.0)   GROSS DESCRIPTION:  A: Specimen: Received fresh and placed in formalin at 1:05 PM on 07/27/2023, and labeled left mastectomy Specimen integrity (intact/disrupted): Intact,  with suture designating the axillary tail Weight: 587 g Size: 18.5 cm medial to lateral by 17.6 cm superior to inferior by 5.3 cm anterior to posterior Skin: There is a 17.5 x 7.0 cm ellipse of tan-pink skin, with a 1.5 x 1.3 cm tan everted nipple.  Skin surface is grossly unremarkable. Parenchyma: Yellow lobulated adipose tissue with tan-white fibrous tissue (approximately 30% fibrous).  No lesions are grossly identified. Prognostic indicators: Obtain from paraffin blocks if needed Lymph nodes: None identified Block summary: 9 blocks submitted 1 and 2 = upper outer quadrant 3 and 4 = lower outer quadrant 5 and 6 = upper inner quadrant 7 and 8 = lower inner quadrant 9 = nipple  B: Specimen: Received fresh and placed in formalin at 1 PM on 07/27/2023, and labeled right mastectomy Specimen integrity (intact/disrupted): Intact, with a suture designating the axillary tail Weight: 592 g Size: 18.2 cm superior to inferior by 17.6 cm medial to lateral by 5.5 cm anterior to posterior Skin: There is a 17.0 x 7.5 cm ellipse of tan-pink skin, with a 1.5 x 1.0 cm tan slightly flattened nipple.  Skin surface is grossly unremarkable. Tumor/cavity: Within the far lower inner quadrant of the breast, a silver metallic ribbon-shaped biopsy clip is identified adjacent to a 3.5 x 1.0 x 1.0 cm band of tan-white fibrous tissue.  No distinct mass or lesion is grossly identified.  Please note patient is status post neoadjuvant therapy.  Biopsy clip measures 3.3 cm from the posterior margin and 1.1 cm to the anterior margin. Uninvolved parenchyma: Yellow lobulated adipose tissue with tan-white fibrous tissue (approximately 30% fibrous). Prognostic indicators: Obtain from paraffin blocks if needed Lymph nodes: Within the upper outer quadrant of the breast, a 0.7 cm tan-yellow possible lymph node is identified. Block summary: 13 blocks submitted 1 = posterior margin closest to area of interest 2 =  area surrounding biopsy clip with anterior margin from lower inner quadrant 3-7 = area surrounding biopsy clip 8 = upper outer quadrant 9 = lower outer quadrant 10 = upper inner quadrant 11 = lower inner quadrant 12 = nipple 13 = 1 possible lymph node, bisected  C: The specimen is received fresh and consists of 24 tan-pink to yellow-orange possible lymph nodes, ranging from 0.3 cm to 2.4 x 1.5 x 1.3 cm.  Multiple possible lymph nodes display tan-white to red-brown solid cut surfaces.  Largest possible lymph node displays a tan-yellow, partially cystic cut surface.  The possible lymph nodes are submitted in 15 cassettes. 1-3 = 4 possible lymph nodes, each 4 = 3 possible lymph nodes 5 = 1 possible lymph node 6 = 1 possible node, bisected 7 = representative sections from 2 possible lymph nodes 8-11 = representative section  from 1 possible lymph node, each 12-15 = 1 possible lymph node, serially section  Lovey Newcomer 07/28/2023)    Receptor Status: Estrogen Receptor: 0%, negative      Progesterone Receptor: 0%, negative      Her2: Negative, 1+      Ki-67: 95%   Did patient present with symptoms (if so, please note symptoms) or was this found on screening mammography?: Patient felt lump to right breast.   Past/Anticipated interventions by surgeon, if any:  07/27/23 BILATERAL MASTECTOMY WITH RIGHT AXILLARY LYMPH NODE DISSECTION  [name], MD  Past/Anticipated interventions by medical oncology, if any:    Lymphedema issues, if any:  no    Pain issues, if any:  no Patient denies pain at this time. However had pain to right chest wall. Patient rates pain at 6/10, pain is relieved with Ibuprofen 600mg . Patient also reports intermittent back pain.   SAFETY ISSUES: Prior radiation? no Pacemaker/ICD? no Possible current pregnancy?no, patient had hysterectomy. Is the patient on methotrexate? no  Current Complaints / other details:    BP 113/78 (BP Location: Left Arm, Patient  Position: Sitting)   Pulse 98   Temp 98 F (36.7 C) (Temporal)   Resp 18   Ht 5\' 1"  (1.549 m)   Wt 140 lb (63.5 kg)   SpO2 100%   BMI 26.45 kg/m

## 2023-08-24 ENCOUNTER — Ambulatory Visit
Admission: RE | Admit: 2023-08-24 | Discharge: 2023-08-24 | Disposition: A | Discharge status: Home / Self Care | Payer: Managed Care, Other (non HMO) | Source: Ambulatory Visit | Attending: Radiation Oncology | Admitting: Radiation Oncology

## 2023-08-24 ENCOUNTER — Encounter: Payer: Self-pay | Admitting: Radiation Oncology

## 2023-08-24 ENCOUNTER — Other Ambulatory Visit: Payer: Self-pay

## 2023-08-24 VITALS — BP 113/78 | HR 98 | Temp 98.0°F | Resp 18 | Ht 61.0 in | Wt 140.0 lb

## 2023-08-24 DIAGNOSIS — Z171 Estrogen receptor negative status [ER-]: Secondary | ICD-10-CM | POA: Insufficient documentation

## 2023-08-24 DIAGNOSIS — Z9221 Personal history of antineoplastic chemotherapy: Secondary | ICD-10-CM | POA: Insufficient documentation

## 2023-08-24 DIAGNOSIS — Z9013 Acquired absence of bilateral breasts and nipples: Secondary | ICD-10-CM | POA: Insufficient documentation

## 2023-08-24 DIAGNOSIS — C773 Secondary and unspecified malignant neoplasm of axilla and upper limb lymph nodes: Secondary | ICD-10-CM | POA: Insufficient documentation

## 2023-08-24 DIAGNOSIS — C50311 Malignant neoplasm of lower-inner quadrant of right female breast: Secondary | ICD-10-CM | POA: Insufficient documentation

## 2023-08-24 DIAGNOSIS — Z79899 Other long term (current) drug therapy: Secondary | ICD-10-CM | POA: Insufficient documentation

## 2023-08-25 ENCOUNTER — Ambulatory Visit: Payer: Managed Care, Other (non HMO) | Admitting: Physical Therapy

## 2023-08-25 ENCOUNTER — Other Ambulatory Visit: Payer: Self-pay

## 2023-08-25 ENCOUNTER — Ambulatory Visit: Payer: Managed Care, Other (non HMO) | Admitting: Hematology and Oncology

## 2023-08-26 ENCOUNTER — Encounter: Payer: Self-pay | Admitting: Hematology and Oncology

## 2023-08-26 ENCOUNTER — Inpatient Hospital Stay: Payer: Managed Care, Other (non HMO)

## 2023-08-26 ENCOUNTER — Other Ambulatory Visit (HOSPITAL_COMMUNITY): Payer: Self-pay

## 2023-08-26 ENCOUNTER — Inpatient Hospital Stay (HOSPITAL_BASED_OUTPATIENT_CLINIC_OR_DEPARTMENT_OTHER): Payer: Managed Care, Other (non HMO) | Admitting: Hematology and Oncology

## 2023-08-26 ENCOUNTER — Encounter: Payer: Self-pay | Admitting: Physical Therapy

## 2023-08-26 ENCOUNTER — Telehealth: Payer: Self-pay | Admitting: Pharmacy Technician

## 2023-08-26 ENCOUNTER — Ambulatory Visit: Payer: Managed Care, Other (non HMO) | Admitting: Physical Therapy

## 2023-08-26 ENCOUNTER — Other Ambulatory Visit: Payer: Self-pay | Admitting: Pharmacist

## 2023-08-26 VITALS — BP 123/74 | HR 83 | Temp 98.2°F | Resp 18 | Ht 61.0 in | Wt 136.4 lb

## 2023-08-26 DIAGNOSIS — Z95828 Presence of other vascular implants and grafts: Secondary | ICD-10-CM

## 2023-08-26 DIAGNOSIS — R293 Abnormal posture: Secondary | ICD-10-CM

## 2023-08-26 DIAGNOSIS — M25612 Stiffness of left shoulder, not elsewhere classified: Secondary | ICD-10-CM

## 2023-08-26 DIAGNOSIS — Z171 Estrogen receptor negative status [ER-]: Secondary | ICD-10-CM

## 2023-08-26 DIAGNOSIS — M25611 Stiffness of right shoulder, not elsewhere classified: Secondary | ICD-10-CM

## 2023-08-26 DIAGNOSIS — C50311 Malignant neoplasm of lower-inner quadrant of right female breast: Secondary | ICD-10-CM | POA: Diagnosis present

## 2023-08-26 DIAGNOSIS — Z5111 Encounter for antineoplastic chemotherapy: Secondary | ICD-10-CM | POA: Diagnosis present

## 2023-08-26 DIAGNOSIS — Z483 Aftercare following surgery for neoplasm: Secondary | ICD-10-CM

## 2023-08-26 DIAGNOSIS — Z79899 Other long term (current) drug therapy: Secondary | ICD-10-CM | POA: Diagnosis not present

## 2023-08-26 LAB — CBC WITH DIFFERENTIAL (CANCER CENTER ONLY)
Abs Immature Granulocytes: 0.01 10*3/uL (ref 0.00–0.07)
Basophils Absolute: 0 10*3/uL (ref 0.0–0.1)
Basophils Relative: 0 %
Eosinophils Absolute: 1 10*3/uL — ABNORMAL HIGH (ref 0.0–0.5)
Eosinophils Relative: 22 %
HCT: 29.3 % — ABNORMAL LOW (ref 36.0–46.0)
Hemoglobin: 9.5 g/dL — ABNORMAL LOW (ref 12.0–15.0)
Immature Granulocytes: 0 %
Lymphocytes Relative: 20 %
Lymphs Abs: 0.9 10*3/uL (ref 0.7–4.0)
MCH: 29.9 pg (ref 26.0–34.0)
MCHC: 32.4 g/dL (ref 30.0–36.0)
MCV: 92.1 fL (ref 80.0–100.0)
Monocytes Absolute: 0.3 10*3/uL (ref 0.1–1.0)
Monocytes Relative: 7 %
Neutro Abs: 2.4 10*3/uL (ref 1.7–7.7)
Neutrophils Relative %: 51 %
Platelet Count: 227 10*3/uL (ref 150–400)
RBC: 3.18 MIL/uL — ABNORMAL LOW (ref 3.87–5.11)
RDW: 12.8 % (ref 11.5–15.5)
WBC Count: 4.7 10*3/uL (ref 4.0–10.5)
nRBC: 0 % (ref 0.0–0.2)

## 2023-08-26 LAB — CMP (CANCER CENTER ONLY)
ALT: 9 U/L (ref 0–44)
AST: 16 U/L (ref 15–41)
Albumin: 4.1 g/dL (ref 3.5–5.0)
Alkaline Phosphatase: 70 U/L (ref 38–126)
Anion gap: 6 (ref 5–15)
BUN: 9 mg/dL (ref 6–20)
CO2: 29 mmol/L (ref 22–32)
Calcium: 9.7 mg/dL (ref 8.9–10.3)
Chloride: 102 mmol/L (ref 98–111)
Creatinine: 0.63 mg/dL (ref 0.44–1.00)
GFR, Estimated: >60 mL/min (ref >60)
Glucose, Bld: 99 mg/dL (ref 70–99)
Potassium: 3.9 mmol/L (ref 3.5–5.1)
Sodium: 137 mmol/L (ref 135–145)
Total Bilirubin: 0.4 mg/dL (ref <1.2)
Total Protein: 7.3 g/dL (ref 6.5–8.1)

## 2023-08-26 LAB — TSH: TSH: 1.067 u[IU]/mL (ref 0.350–4.500)

## 2023-08-26 MED ORDER — CAPECITABINE 500 MG PO TABS: 625.0000 mg/m2 | ORAL_TABLET | Freq: Two times a day (BID) | ORAL | 0 refills | Status: DC

## 2023-08-26 MED ORDER — SODIUM CHLORIDE 0.9 % IV SOLN: Freq: Once | INTRAVENOUS | Status: AC

## 2023-08-26 MED ORDER — SODIUM CHLORIDE 0.9 % IV SOLN
200.0000 mg | Freq: Once | INTRAVENOUS | Status: AC
  Administered 2023-08-26: 200 mg via INTRAVENOUS
  Filled 2023-08-26: qty 200

## 2023-08-26 MED ORDER — CAPECITABINE 500 MG PO TABS: ORAL_TABLET | ORAL | 0 refills | Status: DC

## 2023-08-26 MED ORDER — SODIUM CHLORIDE 0.9% FLUSH
10.0000 mL | INTRAVENOUS | Status: DC | PRN
  Administered 2023-08-26: 10 mL

## 2023-08-26 MED ORDER — HEPARIN SOD (PORK) LOCK FLUSH 100 UNIT/ML IV SOLN
500.0000 [IU] | Freq: Once | INTRAVENOUS | Status: AC | PRN
  Administered 2023-08-26: 500 [IU]

## 2023-08-26 MED ORDER — SODIUM CHLORIDE 0.9% FLUSH
10.0000 mL | Freq: Once | INTRAVENOUS | Status: AC
  Administered 2023-08-26: 10 mL

## 2023-08-26 NOTE — Telephone Encounter (Signed)
Oral Oncology Patient Advocate Encounter  Prior Authorization for capecitabine has been approved.    PA# [id number] Effective dates: 08/26/23 through 08/24/24  Patient is required to fill at [facility].  Patient's plan utilizes [employer].     Jinger Neighbors, CPhT-Adv Oncology Pharmacy Patient Advocate [facility] Direct Number: [phone]  Fax: [phone]

## 2023-08-26 NOTE — Therapy (Signed)
OUTPATIENT PHYSICAL THERAPY BREAST CANCER POST OP TREATMENT   Patient Name: Suzanne Tucker MRN: [id number] DOB:03-Oct-1981, 42 y.o., female Today's Date: 08/26/2023  END OF SESSION:  PT End of Session - 08/26/23 0915     Visit Number 5    Number of Visits 10    Date for PT Re-Evaluation 09/12/23    Authorization Type [employer]    PT Start Time [phone]    PT Stop Time 0908    PT Time Calculation (min) 24 min    Activity Tolerance Patient tolerated treatment well    Behavior During Therapy [facility] for tasks assessed/performed                Past Medical History:  Diagnosis Date   Anginal pain (HCC) 02/2019   related to MVP   Back pain    Breast cancer (HCC)    Depression    Dysrhythmia    MVP   Mitral prolapse    Sleep apnea    mild no cpap indicated   Past Surgical History:  Procedure Laterality Date   BREAST SURGERY Right 2024   Breast biopsy x 2   CESAREAN SECTION  03/30/2008   MASTECTOMY WITH AXILLARY LYMPH NODE DISSECTION Bilateral 07/27/2023   Procedure: BILATERAL MASTECTOMY WITH RIGHT AXILLARY LYMPH NODE DISSECTION;  Surgeon: [name], MD;  Location: [facility];  Service: General;  Laterality: Bilateral;  PEC BLOCK   PORTACATH PLACEMENT N/A 01/27/2023   Procedure: INSERTION PORT-A-CATH;  Surgeon: [name], MD;  Location: [facility];  Service: General;  Laterality: N/A;  leaving accessed   ROBOTIC ASSISTED TOTAL HYSTERECTOMY Bilateral 03/15/2019   Procedure: XI ROBOTIC ASSISTED TOTAL HYSTERECTOMY with Bilateral Salpingectomy;  Surgeon: Silverio Lay, MD;  Location: [facility];  Service: Gynecology;  Laterality: Bilateral;   TUBAL LIGATION  2017   Patient Active Problem List   Diagnosis Date Noted   Complication, postoperative infection 08/10/2023   Breast cancer of lower-inner quadrant of right female breast (HCC) 07/27/2023   Dysautonomia (HCC) 07/25/2023   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 [name]  REFERRING DIAG: Right breast cancer  THERAPY DIAG:  Abnormal posture  Stiffness of right shoulder, not elsewhere classified  Stiffness of left shoulder, not elsewhere classified  Malignant neoplasm of lower-inner quadrant of right breast of female, estrogen receptor negative (HCC)  Aftercare following surgery for neoplasm  Rationale for Evaluation and Treatment: Rehabilitation  ONSET DATE: 07/27/2023  SUBJECTIVE:  SUBJECTIVE STATEMENT: Patient reports she is doing good today. She is currently having 2/10 pain.  From eval: Patient reports she underwent neoadjuvant chemotherapy from 01/28/2023 - 06/27/2023 followed by a bilateral mastectomy and right axillary lymph node dissection (8 of 20 nodes positive) on 07/27/2023. She is triple negative and will undergo radiation on the right side. She is having a PET scan to ensure there are no metastases. She had a breast infection and was hospitalized last week from 08/10/2023 - 08/13/2023. She is on antibiotics until the end of this week.  PERTINENT HISTORY:  Patient was diagnosed on 01/07/2023 with right grade 3 invasive ductal carcinoma breast cancer. 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 She had a bilateral mastectomy and right axillary  lymph node dissection (8 of 20 nodes positive) on 07/27/2023.  PATIENT GOALS:  Reassess how my recovery is going related to arm function, pain, and swelling.  PAIN: 08/26/2023 Are you having pain? Yes: NPRS scale: 2/10 Pain location: chest and sternum Pain description: burning and hypersensitive Aggravating factors: nighttime Relieving factors: Tylenol  PRECAUTIONS: Recent Surgery, right UE Lymphedema risk  RED FLAGS: None   ACTIVITY LEVEL / LEISURE: She has not returned to exercising   OBJECTIVE:  Today's Treatment 08/22/2023 Supine shoulder PROM (flexion, abduction & Rt scapular mobility) bilateral x 12 min Pulleys (flexion/ abduction) x 2 mins each [facility] on wall x 20 Standing bow and arrow stretch x 8 each   08/22/2023 Supine shoulder PROM (flexion, abduction & Rt scapular mobility) bilateral x 15 min Pulleys (flexion/ abduction) x 2 mins each Ball Rolls on wall x 20 Seated SB stretch  3 way x 8 each direction Standing bow and arrow stretch x 8 each   08/18/2023 Supine shoulder PROM (flexion, abduction & Rt scapular mobility) B Pulleys (flexion/ abduction) x 2 mins each Ball Rolls on wall x 20  PATIENT SURVEYS:  QUICK DASH:    OBSERVATIONS: Bilateral chest incisions appear to be healing well with steri-strips in place. There is no visible significant swelling present. No redness or signs of infection present. Hypersensitivity present in in chest and along midline at sternum.  POSTURE:  Forward head and rounded shoulders  LYMPHEDEMA ASSESSMENT:   UPPER EXTREMITY AROM/PROM:   A/PROM RIGHT   eval   RIGHT 08/15/2023  Shoulder extension 53 40  Shoulder flexion 157 113  Shoulder abduction 159 132  Shoulder internal rotation 71 74  Shoulder external rotation 86 75                          (Blank rows = not tested)   A/PROM LEFT   eval LEFT 08/15/2023  Shoulder extension 43 48  Shoulder flexion 150 138  Shoulder abduction 160 144  Shoulder  internal rotation 72 70  Shoulder external rotation 76 80                          (Blank rows = not tested)   CERVICAL AROM: All within normal limits   UPPER EXTREMITY STRENGTH: WFL   LYMPHEDEMA ASSESSMENTS:    LANDMARK RIGHT   eval RIGHT 08/15/2023  10 cm proximal to olecranon process 28.5 27.2  Olecranon process 24.1 23.1  10 cm proximal to ulnar styloid process 23.6 21.7  Just proximal to ulnar styloid process 15.6 15  Across hand at thumb web space 17.7 18  At base of 2nd digit 5.9 5.8  (Blank rows =  not tested)   LANDMARK LEFT   eval LEFT 08/15/2023  10 cm proximal to olecranon process 28.6 26.8  Olecranon process 23.8 23.3  10 cm proximal to ulnar styloid process 21.9 21.7  Just proximal to ulnar styloid process 15.4 14.6  Across hand at thumb web space 17.7 17.2  At base of 2nd digit 5.6 5.5  (Blank rows = not tested)  Surgery type/Date: Bilateral mastectomy and right ALND 07/27/2023 Number of lymph nodes removed: 20 Current/past treatment (chemo, radiation, hormone therapy): Neoadjuvant chemotherapy Other symptoms:  Heaviness/tightness Yes Pain Yes Pitting edema No Infections Yes Decreased scar mobility Yes Stemmer sign No  PATIENT EDUCATION:  Education details: post op HEP (bil AAROM flexion with hands clasped together, scapular retraction, ER with hands behind head, and shoulder abduction crawling up wall) and wall scapular protraction push offs Person educated: Patient Education method: Explanation, Demonstration, Tactile cues, Verbal cues, and Handouts Education comprehension: verbalized understanding and returned demonstration  HOME EXERCISE PROGRAM: Reviewed previously given post op HEP.  ASSESSMENT:  CLINICAL IMPRESSION: Aradhya presented to therapy today with no new complaints. She tolerated treatment session well and did not verbalize any increased pain. She required verbal and visual cues for correct exercise performance. Noted improvements  when Rt shoulder flexion mobility. Patient will benefit from skilled PT to address the below impairments and improve overall function.     Pt will benefit from skilled therapeutic intervention to improve on the following deficits: Decreased knowledge of precautions, impaired UE functional use, pain, decreased ROM, postural dysfunction.   PT treatment/interventions: ADL/Self care home management, [phone]- PT Re-evaluation, 97110-Therapeutic exercises, 97530- Therapeutic activity, 97535- Self Care, 60454- Manual therapy, Patient/Family education, Dry Needling, Manual lymph drainage, and Scar mobilization   GOALS: Goals reviewed with patient? Yes  LONG TERM GOALS:  (STG=LTG)  GOALS Name Target Date  Goal status  1 Pt will demonstrate she has regained shoulder active flexion and abduction ROM back to baseline bilaterally (>/= 150 flexion and >/= 160 abduction)  09/12/2023 INITIAL  2 Patient will report >/= 50% reduction in skin hypersensitivity for improved tolerance to wearing shirts and bras. 09/12/2023 INITIAL  3 Patient will verbalize good understanding of self scar massage and lymphedema risk reduction practices. 09/12/2023 INITIAL  4 Patient will improve her DASH score to be back to zero (baseline) for improved UE function. 09/12/2023 INITIAL     PLAN:  PT FREQUENCY/DURATION: 2x/week for 4 weeks  PLAN FOR NEXT SESSION: Continue shoulder PROM, Rt scapular mobility   [facility]  [address], [address]  [place] [address]  [phone]  After Breast Cancer Class It is recommended you attend the ABC class to be educated on lymphedema risk reduction. This class is free of charge and lasts for 1 hour. It is a 1-time class. You will need to download the TEAMS app either on your phone or computer. We will send you a link the night before or the morning of the class. You should be able to click on that link to join the class. This is not a confidential class. You  don't have to turn your camera on, but other participants may be able to see your email address. You were given the info in writing since you can't attend the class.  Scar massage You can begin gentle scar massage to you incision sites. Gently place one hand on the incision and move the skin (without sliding on the skin) in various directions. Do this for a few minutes  and then you can gently massage either coconut oil or vitamin E cream into the scars. YOU'LL BE READY TO BEGIN THIS WHEN YOUR STERI-STRIPS ARE OFF AND THE INCISION IS CLOSED.  Compression garment YOU DON'T NEED TO WEAR A GARMENT SINCE YOU DON'T HAVE SWELLING.  Home exercise Program Continue doing the exercises you were given until you feel like you can do them without feeling any tightness at the end.   Walking Program Studies show that 30 minutes of walking per day (fast enough to elevate your heart rate) can significantly reduce the risk of a cancer recurrence. If you can't walk due to other medical reasons, we encourage you to find another activity you could do (like a stationary bike or water exercise).  Posture After breast cancer surgery, people frequently sit with rounded shoulders posture because it puts their incisions on slack and feels better. If you sit like this and scar tissue forms in that position, you can become very tight and have pain sitting or standing with good posture. Try to be aware of your posture and sit and stand up tall to heal properly.  Follow up PT: It is recommended you return every 3 months for the first 3 years following surgery to be assessed on the SOZO machine for an L-Dex score. This helps prevent clinically significant lymphedema in 95% of patients. These follow up screens are 10 minute appointments that you are not billed for.  Claude Manges, PT 08/26/23 9:16 AM

## 2023-08-26 NOTE — Progress Notes (Signed)
Patient Care Team: Serena Croissant, MD as PCP - General (Hematology and Oncology) Corky Crafts, MD as PCP - Cardiology (Cardiology) Duke Salvia, MD as PCP - Electrophysiology (Cardiology) Serena Croissant, MD as Consulting Physician (Hematology and Oncology) Lonie Peak, MD as Attending Physician (Radiation Oncology) [name], MD as Consulting Physician (General Surgery) Donnelly Angelica, RN as Oncology Nurse Navigator Pershing Proud, RN as Oncology Nurse Navigator  DIAGNOSIS:  Encounter Diagnosis  Name Primary?   Malignant neoplasm of lower-inner quadrant of right breast of female, estrogen receptor negative (HCC) Yes    SUMMARY OF ONCOLOGIC HISTORY: Oncology History  Malignant neoplasm of lower-inner quadrant of right breast of female, estrogen receptor negative (HCC)  01/10/2023 Initial Diagnosis   Palpable lump right axilla x 2 weeks bulky axillary lymphadenopathy ultrasound breast: 2 masses at 4 o'clock position 1.1 cm and 0.6 cm multiple axillary lymph nodes largest 4.5 cm also level 2 and 3 (subpectoral lymph node as well): Biopsy: Grade 3 IDC ER 0%, PR 0%, Ki-67 95%, HER2 1+   01/26/2023 Genetic Testing   Negative [employer] Custom Panel (37 genes).  Report date is 01/26/2023.    The [employer] Custom Cancers + RNA Panel includes sequencing, deletion/duplication, and RNA analysis of the following 37 genes: APC, ATM, AXIN2, BARD1, BMPR1A, BRCA1, BRCA2, BRIP1, CDH1, CDK4*, CDKN2A (p14ARF)*, CDKN2A (p16INK4a), CHEK2, CTNNA1, DICER1, EPCAM*, FH, GREM1*, HOXB13*, MBD4*, MLH1, MSH2, MSH3, MSH6, MUTYH, NF1, NTHL1, PALB2, PMS2, POLD1, POLE, PTEN, RAD51C, RAD51D, SMAD4, SMARCA4, STK11, TP53.  *Genes without RNA analysis.    01/28/2023 - 06/27/2023 Chemotherapy   Patient is on Treatment Plan : BREAST Pembrolizumab (200) D1 + Carboplatin (1.5) D1,8,15 + Paclitaxel (80) D1,8,15 q21d X 4 cycles / Pembrolizumab (200) D1 + AC D1 q21d x 4 cycles     07/15/2023 -  Chemotherapy   Patient  is on Treatment Plan : BREAST Pembrolizumab (200) q21d x 27 weeks       CHIEF COMPLIANT: Follow-up on Keytruda  HISTORY OF PRESENT ILLNESS:  History of Present Illness   The patient, with a history of cancer, presents post-surgery with some pain in the rib area and back, which she attributes to moving things around. She has also been experiencing gastrointestinal symptoms, including nausea and vomiting, which she believes are related to the surgery and subsequent antibiotic treatment. There is a small spot on a PET scan that may still be cancerous, and there is a discussion about whether this is inflammation or residual cancer. There was also a spot on the ovary that appeared on the first PET scan but has since disappeared.         ALLERGIES:  is allergic to morphine; tape; tessalon [benzonatate]; hydrocodone; latex; antihistamines, loratadine-type; codeine; kiwi extract; oxycontin [oxycodone]; and tramadol.  MEDICATIONS:  Current Outpatient Medications  Medication Sig Dispense Refill   capecitabine (XELODA) 500 MG tablet Take 2 tablets (1,000 mg total) by mouth 2 (two) times daily after a meal. Only on days of radiation 140 tablet 0   acetaminophen (TYLENOL) 500 MG tablet Take 1,000 mg by mouth every 6 (six) hours as needed for fever or mild pain (pain score 1-3).     calcium carbonate (TUMS - DOSED IN MG ELEMENTAL CALCIUM) 500 MG chewable tablet Chew 1,000 mg by mouth as needed for indigestion or heartburn.     fluticasone (FLONASE) 50 MCG/ACT nasal spray Place 2 sprays into both nostrils daily. (Patient taking differently: Place 2 sprays into both nostrils daily as needed for  allergies or rhinitis.) 16 g 0   ibuprofen (ADVIL) 600 MG tablet Take 1 tablet (600 mg total) by mouth every 6 (six) hours as needed for mild pain (pain score 1-3), moderate pain (pain score 4-6) or headache. 60 tablet 1   Ibuprofen 200 MG CAPS Take 200-400 mg by mouth every 6 (six) hours as needed (for mild pain or  fever).     methocarbamol (ROBAXIN) 500 MG tablet Take 1 tablet (500 mg total) by mouth every 6 (six) hours as needed for muscle spasms. (Patient not taking: Reported on 08/10/2023) 10 tablet 0   ondansetron (ZOFRAN) 8 MG tablet Take 1 tablet (8 mg total) by mouth every 8 (eight) hours as needed for nausea or vomiting. Start on the third day after chemotherapy. 30 tablet 1   No current facility-administered medications for this visit.   Facility-Administered Medications Ordered in Other Visits  Medication Dose Route Frequency Provider Last Rate Last Admin   sodium chloride flush (NS) 0.9 % injection 10 mL  10 mL Intracatheter PRN Serena Croissant, MD   10 mL at 08/26/23 1251    PHYSICAL EXAMINATION: ECOG PERFORMANCE STATUS: 1 - Symptomatic but completely ambulatory  Vitals:   08/26/23 1047  BP: 123/74  Pulse: 83  Resp: 18  Temp: 98.2 F (36.8 C)  SpO2: 100%   Filed Weights   08/26/23 1047  Weight: 136 lb 6.4 oz (61.9 kg)    Physical Exam   MEASUREMENTS: WT- 136 pounds CHEST: No issues.      (exam performed in the presence of a chaperone)  LABORATORY DATA:  I have reviewed the data as listed    Latest Ref Rng & Units 08/26/2023   10:20 AM 08/11/2023    3:53 AM 08/10/2023    6:05 PM  CMP  Glucose 70 - 99 mg/dL 99  027  253   BUN 6 - 20 mg/dL 9  7  9    Creatinine 0.44 - 1.00 mg/dL 6.64  4.03  4.74   Sodium 135 - 145 mmol/L 137  137  137   Potassium 3.5 - 5.1 mmol/L 3.9  3.5  3.6   Chloride 98 - 111 mmol/L 102  105  104   CO2 22 - 32 mmol/L 29  21  24    Calcium 8.9 - 10.3 mg/dL 9.7  8.8  9.2   Total Protein 6.5 - 8.1 g/dL 7.3   7.0   Total Bilirubin <1.2 mg/dL 0.4   0.6   Alkaline Phos 38 - 126 U/L 70   67   AST 15 - 41 U/L 16   16   ALT 0 - 44 U/L 9   12     Lab Results  Component Value Date   WBC 4.7 08/26/2023   HGB 9.5 (L) 08/26/2023   HCT 29.3 (L) 08/26/2023   MCV 92.1 08/26/2023   PLT 227 08/26/2023   NEUTROABS 2.4 08/26/2023    ASSESSMENT & PLAN:   Malignant neoplasm of lower-inner quadrant of right breast of female, estrogen receptor negative (HCC) 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%, HER2 1+  01/25/2023: PET/CT: Right breast cancer, right axilla, retropectoral and right cervical lymph nodes   Treatment plan: Neoadjuvant chemotherapy with Taxol Harrell Gave weekly  x 12 followed by Adriamycin Cytoxan Keytruda followed by Rande Lawman maintenance 07/27/2023: Bilateral mastectomies: Left mastectomy: Benign, right mastectomy: No evidence of residual cancer, 8/20 lymph nodes positive with extranodal extension in 2 lymph nodes Adjuvant radiation therapy 09/08/2023-10/24/2022 -------------------------------------------------------------------------------------------------------- Treatment plan: Rande Lawman with capecitabine maintenance therapy Capecitabine counseling: I discussed the risks and benefits of capecitabine including the risk of diarrhea and hand-foot syndrome Patient will have chemo education with Jonny Ruiz  ------------------------------------- Assessment and Plan    Breast Cancer Healing well post-surgery. Noted residual area in subpectoral region on PET scan, possibly residual cancer or inflammation. Plan for radiation therapy starting on December 5th. -Start Capecitabine (Xeloda) 1000mg  AM and PM on days of radiation (Monday through Friday). -After radiation, switch to 2 weeks on, 1 week off schedule. -Continue Keytruda until December 30, 2023.  Ovarian Cyst Noted on initial PET scan, resolved on subsequent scan. History of cysts. -Refer to Dr. Estanislado Pandy for transvaginal ultrasound for further evaluation.  Nausea and Vomiting Likely related to post-surgical recovery and antibiotic use. Improved  with use of Zofran. -Continue Zofran as needed for nausea.  Weight Loss Noted weight loss due to nausea and vomiting. -Monitor weight. Encourage maintenance of current weight and continuation of healthy diet.  General Health Maintenance -Schedule appointment with Jonny Ruiz for medication review and baseline labs. -Plan for strengthening and exercise regimen post-radiation therapy.          Orders Placed This Encounter  Procedures   CBC with Differential (Cancer Center Only)    Standing Status:   Future    Standing Expiration Date:   08/25/2024   CMP (Cancer Center only)    Standing Status:   Future    Standing Expiration Date:   08/25/2024   The patient has a good understanding of the overall plan. she agrees with it. she will call with any problems that may develop before the next visit here. Total time spent: 30 mins including face to face time and time spent for planning, charting and co-ordination of care   Tamsen Meek, MD 08/26/23

## 2023-08-26 NOTE — Assessment & Plan Note (Signed)
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%, HER2 1+  01/25/2023: PET/CT: Right breast cancer, right axilla, retropectoral and right cervical lymph nodes   Treatment plan: Neoadjuvant chemotherapy with Taxol carbo Keytruda weekly x 12 followed by Adriamycin Cytoxan Keytruda followed by Rande Lawman maintenance 07/27/2023: Bilateral mastectomies: Left mastectomy: Benign, right mastectomy: No evidence of residual cancer, 8/20 lymph nodes positive with extranodal extension in 2 lymph nodes Adjuvant radiation therapy 09/08/2023-10/24/2022 -------------------------------------------------------------------------------------------------------- Treatment plan: Rande Lawman with capecitabine maintenance therapy Capecitabine counseling: I discussed the risks and benefits of capecitabine including the risk of diarrhea and hand-foot syndrome Patient will have chemo education with Jonny Ruiz

## 2023-08-26 NOTE — Telephone Encounter (Signed)
Oral Oncology Patient Advocate Encounter   Received notification that prior authorization for capecitabine is required.   PA submitted on 08/26/23 Key [id number] Status is pending     Suzanne Tucker, CPhT-Adv Oncology Pharmacy Patient Advocate [facility] Direct Number: [phone]  Fax: [phone]

## 2023-08-28 LAB — T4: T4, Total: 8.8 ug/dL (ref 4.5–12.0)

## 2023-08-29 ENCOUNTER — Other Ambulatory Visit: Payer: Self-pay | Admitting: Hematology and Oncology

## 2023-08-30 ENCOUNTER — Encounter: Payer: Self-pay | Admitting: Hematology and Oncology

## 2023-08-30 DIAGNOSIS — C50311 Malignant neoplasm of lower-inner quadrant of right female breast: Secondary | ICD-10-CM | POA: Diagnosis not present

## 2023-08-31 ENCOUNTER — Ambulatory Visit: Payer: Managed Care, Other (non HMO)

## 2023-08-31 DIAGNOSIS — R252 Cramp and spasm: Secondary | ICD-10-CM

## 2023-08-31 DIAGNOSIS — Z171 Estrogen receptor negative status [ER-]: Secondary | ICD-10-CM

## 2023-08-31 DIAGNOSIS — M25612 Stiffness of left shoulder, not elsewhere classified: Secondary | ICD-10-CM

## 2023-08-31 DIAGNOSIS — M25611 Stiffness of right shoulder, not elsewhere classified: Secondary | ICD-10-CM

## 2023-08-31 DIAGNOSIS — R293 Abnormal posture: Secondary | ICD-10-CM

## 2023-08-31 DIAGNOSIS — C50311 Malignant neoplasm of lower-inner quadrant of right female breast: Secondary | ICD-10-CM | POA: Diagnosis not present

## 2023-08-31 NOTE — Therapy (Signed)
OUTPATIENT PHYSICAL THERAPY BREAST CANCER POST OP TREATMENT   Patient Name: Suzanne Tucker MRN: [id number] DOB:Aug 21, 1981, 42 y.o., female Today's Date: 08/31/2023  END OF SESSION:  PT End of Session - 08/31/23 1025     Visit Number 6    Number of Visits 10    Date for PT Re-Evaluation 09/12/23    Authorization Type [employer]    PT Start Time 1022    PT Stop Time 1043    PT Time Calculation (min) 21 min    Activity Tolerance Patient tolerated treatment well    Behavior During Therapy [facility] for tasks assessed/performed                Past Medical History:  Diagnosis Date   Anginal pain (HCC) 02/2019   related to MVP   Back pain    Breast cancer (HCC)    Depression    Dysrhythmia    MVP   Mitral prolapse    Sleep apnea    mild no cpap indicated   Past Surgical History:  Procedure Laterality Date   BREAST SURGERY Right 2024   Breast biopsy x 2   CESAREAN SECTION  03/30/2008   MASTECTOMY WITH AXILLARY LYMPH NODE DISSECTION Bilateral 07/27/2023   Procedure: BILATERAL MASTECTOMY WITH RIGHT AXILLARY LYMPH NODE DISSECTION;  Surgeon: [name], MD;  Location: [facility];  Service: General;  Laterality: Bilateral;  PEC BLOCK   PORTACATH PLACEMENT N/A 01/27/2023   Procedure: INSERTION PORT-A-CATH;  Surgeon: [name], MD;  Location: [facility];  Service: General;  Laterality: N/A;  leaving accessed   ROBOTIC ASSISTED TOTAL HYSTERECTOMY Bilateral 03/15/2019   Procedure: XI ROBOTIC ASSISTED TOTAL HYSTERECTOMY with Bilateral Salpingectomy;  Surgeon: Silverio Lay, MD;  Location: [facility];  Service: Gynecology;  Laterality: Bilateral;   TUBAL LIGATION  2017   Patient Active Problem List   Diagnosis Date Noted   Complication, postoperative infection 08/10/2023   Breast cancer of lower-inner quadrant of right female breast (HCC) 07/27/2023   Dysautonomia (HCC) 07/25/2023   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 [name]  REFERRING DIAG: Right breast cancer  THERAPY DIAG:  Abnormal posture  Stiffness of left shoulder, not elsewhere classified  Malignant neoplasm of lower-inner quadrant of right breast of female, estrogen receptor negative (HCC)  Stiffness of right shoulder, not elsewhere classified  Cramp and spasm  Rationale for Evaluation and Treatment: Rehabilitation  ONSET DATE: 07/27/2023  SUBJECTIVE:  SUBJECTIVE STATEMENT: Patient reports she is doing good today. "I only have about 20 min.  I'm so sorry, I have so many errands to run for Thanksgiving"  From eval: Patient reports she underwent neoadjuvant chemotherapy from 01/28/2023 - 06/27/2023 followed by a bilateral mastectomy and right axillary lymph node dissection (8 of 20 nodes positive) on 07/27/2023. She is triple negative and will undergo radiation on the right side. She is having a PET scan to ensure there are no metastases. She had a breast infection and was hospitalized last week from 08/10/2023 - 08/13/2023. She is on antibiotics until the end of this week.  PERTINENT HISTORY:  Patient was diagnosed on 01/07/2023 with right grade 3 invasive ductal carcinoma breast cancer. 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 She had a bilateral  mastectomy and right axillary lymph node dissection (8 of 20 nodes positive) on 07/27/2023.  PATIENT GOALS:  Reassess how my recovery is going related to arm function, pain, and swelling.  PAIN: 08/26/2023 Are you having pain? Yes: NPRS scale: 2/10 Pain location: chest and sternum Pain description: burning and hypersensitive Aggravating factors: nighttime Relieving factors: Tylenol  PRECAUTIONS: Recent Surgery, right UE Lymphedema risk  RED FLAGS: None   ACTIVITY LEVEL / LEISURE: She has not returned to exercising   OBJECTIVE:  Today's Treatment 08/31/2023 Pendulums x 20 each direction Supine shoulder PROM (flexion, abduction & Rt scapular mobility) bilateral x 10 min Shoulder perturbations at 90 degrees flexion supine Shoulder alphabet A-Z Star gazer in supine 10 reps holding 5 sec at stretch point Ball roll outs with green physio ball x 5 each direction holding 5 sec each Seated bilateral shoulder ER with yellow band 2 x 10 Seated shoulder horizontal abduction 2 x 10 Patient states she only had 20 min available today due to busy schedule   08/22/2023 Supine shoulder PROM (flexion, abduction & Rt scapular mobility) bilateral x 15 min Pulleys (flexion/ abduction) x 2 mins each [facility] on wall x 20 Seated SB stretch  3 way x 8 each direction Standing bow and arrow stretch x 8 each   08/18/2023 Supine shoulder PROM (flexion, abduction & Rt scapular mobility) B Pulleys (flexion/ abduction) x 2 mins each [facility] on wall x 20  PATIENT SURVEYS:  QUICK DASH:    OBSERVATIONS: Bilateral chest incisions appear to be healing well with steri-strips in place. There is no visible significant swelling present. No redness or signs of infection present. Hypersensitivity present in in chest and along midline at sternum.  POSTURE:  Forward head and rounded shoulders  LYMPHEDEMA ASSESSMENT:   UPPER EXTREMITY AROM/PROM:   A/PROM RIGHT   eval   RIGHT 08/15/2023   Shoulder extension 53 40  Shoulder flexion 157 113  Shoulder abduction 159 132  Shoulder internal rotation 71 74  Shoulder external rotation 86 75                          (Blank rows = not tested)   A/PROM LEFT   eval LEFT 08/15/2023  Shoulder extension 43 48  Shoulder flexion 150 138  Shoulder abduction 160 144  Shoulder internal rotation 72 70  Shoulder external rotation 76 80                          (Blank rows = not tested)   CERVICAL AROM: All within normal limits   UPPER EXTREMITY STRENGTH: WFL   LYMPHEDEMA  ASSESSMENTS:    LANDMARK RIGHT   eval RIGHT 08/15/2023  10 cm proximal to olecranon process 28.5 27.2  Olecranon process 24.1 23.1  10 cm proximal to ulnar styloid process 23.6 21.7  Just proximal to ulnar styloid process 15.6 15  Across hand at thumb web space 17.7 18  At base of 2nd digit 5.9 5.8  (Blank rows = not tested)   LANDMARK LEFT   eval LEFT 08/15/2023  10 cm proximal to olecranon process 28.6 26.8  Olecranon process 23.8 23.3  10 cm proximal to ulnar styloid process 21.9 21.7  Just proximal to ulnar styloid process 15.4 14.6  Across hand at thumb web space 17.7 17.2  At base of 2nd digit 5.6 5.5  (Blank rows = not tested)  Surgery type/Date: Bilateral mastectomy and right ALND 07/27/2023 Number of lymph nodes removed: 20 Current/past treatment (chemo, radiation, hormone therapy): Neoadjuvant chemotherapy Other symptoms:  Heaviness/tightness Yes Pain Yes Pitting edema No Infections Yes Decreased scar mobility Yes Stemmer sign No  PATIENT EDUCATION:  Education details: post op HEP (bil AAROM flexion with hands clasped together, scapular retraction, ER with hands behind head, and shoulder abduction crawling up wall) and wall scapular protraction push offs Person educated: Patient Education method: Explanation, Demonstration, Tactile cues, Verbal cues, and Handouts Education comprehension: verbalized understanding and returned  demonstration  HOME EXERCISE PROGRAM: Reviewed previously given post op HEP.  ASSESSMENT:  CLINICAL IMPRESSION: Asana is progressing appropriately.  She has full right shoulder ROM but there is some pulling into the distal axillary area.  She was able to tolerate addition of gentle resistance exercises, plyo ball roll outs including laterals, and star gazer stretch.  She should continue to do well.   Patient will benefit from skilled PT to address the below impairments and improve overall function.     Pt will benefit from skilled therapeutic intervention to improve on the following deficits: Decreased knowledge of precautions, impaired UE functional use, pain, decreased ROM, postural dysfunction.   PT treatment/interventions: ADL/Self care home management, [phone]- PT Re-evaluation, 97110-Therapeutic exercises, 97530- Therapeutic activity, 97535- Self Care, [id number]- Manual therapy, Patient/Family education, Dry Needling, Manual lymph drainage, and Scar mobilization   GOALS: Goals reviewed with patient? Yes  LONG TERM GOALS:  (STG=LTG)  GOALS Name Target Date  Goal status  1 Pt will demonstrate she has regained shoulder active flexion and abduction ROM back to baseline bilaterally (>/= 150 flexion and >/= 160 abduction)  09/12/2023 INITIAL  2 Patient will report >/= 50% reduction in skin hypersensitivity for improved tolerance to wearing shirts and bras. 09/12/2023 INITIAL  3 Patient will verbalize good understanding of self scar massage and lymphedema risk reduction practices. 09/12/2023 INITIAL  4 Patient will improve her DASH score to be back to zero (baseline) for improved UE function. 09/12/2023 INITIAL     PLAN:  PT FREQUENCY/DURATION: 2x/week for 4 weeks  PLAN FOR NEXT SESSION: Continue shoulder PROM, Rt scapular mobility   [facility]  [address], [address]  [place] [address]  [phone]  After Breast Cancer Class It is recommended you  attend the ABC class to be educated on lymphedema risk reduction. This class is free of charge and lasts for 1 hour. It is a 1-time class. You will need to download the TEAMS app either on your phone or computer. We will send you a link the night before or the morning of the class. You should be able to click on that link to join  the class. This is not a confidential class. You don't have to turn your camera on, but other participants may be able to see your email address. You were given the info in writing since you can't attend the class.  Scar massage You can begin gentle scar massage to you incision sites. Gently place one hand on the incision and move the skin (without sliding on the skin) in various directions. Do this for a few minutes and then you can gently massage either coconut oil or vitamin E cream into the scars. YOU'LL BE READY TO BEGIN THIS WHEN YOUR STERI-STRIPS ARE OFF AND THE INCISION IS CLOSED.  Compression garment YOU DON'T NEED TO WEAR A GARMENT SINCE YOU DON'T HAVE SWELLING.  Home exercise Program Continue doing the exercises you were given until you feel like you can do them without feeling any tightness at the end.   Walking Program Studies show that 30 minutes of walking per day (fast enough to elevate your heart rate) can significantly reduce the risk of a cancer recurrence. If you can't walk due to other medical reasons, we encourage you to find another activity you could do (like a stationary bike or water exercise).  Posture After breast cancer surgery, people frequently sit with rounded shoulders posture because it puts their incisions on slack and feels better. If you sit like this and scar tissue forms in that position, you can become very tight and have pain sitting or standing with good posture. Try to be aware of your posture and sit and stand up tall to heal properly.  Follow up PT: It is recommended you return every 3 months for the first 3 years following  surgery to be assessed on the SOZO machine for an L-Dex score. This helps prevent clinically significant lymphedema in 95% of patients. These follow up screens are 10 minute appointments that you are not billed for.  Victorino Dike B. Tequilla Cousineau, PT 08/31/23 11:00 AM [facility] [address], [address] [place], [place] [address] Phone # [phone] Fax [phone]

## 2023-09-01 NOTE — Progress Notes (Signed)
No viist

## 2023-09-04 ENCOUNTER — Other Ambulatory Visit: Payer: Self-pay

## 2023-09-05 ENCOUNTER — Ambulatory Visit: Payer: Managed Care, Other (non HMO) | Attending: General Surgery | Admitting: Physical Therapy

## 2023-09-05 ENCOUNTER — Encounter: Payer: Self-pay | Admitting: *Deleted

## 2023-09-05 ENCOUNTER — Telehealth: Payer: Self-pay | Admitting: Hematology and Oncology

## 2023-09-05 ENCOUNTER — Encounter: Payer: Self-pay | Admitting: Physical Therapy

## 2023-09-05 DIAGNOSIS — R252 Cramp and spasm: Secondary | ICD-10-CM | POA: Diagnosis present

## 2023-09-05 DIAGNOSIS — R293 Abnormal posture: Secondary | ICD-10-CM | POA: Insufficient documentation

## 2023-09-05 DIAGNOSIS — Z483 Aftercare following surgery for neoplasm: Secondary | ICD-10-CM | POA: Diagnosis present

## 2023-09-05 DIAGNOSIS — C50311 Malignant neoplasm of lower-inner quadrant of right female breast: Secondary | ICD-10-CM | POA: Diagnosis present

## 2023-09-05 DIAGNOSIS — M25612 Stiffness of left shoulder, not elsewhere classified: Secondary | ICD-10-CM | POA: Diagnosis present

## 2023-09-05 DIAGNOSIS — Z171 Estrogen receptor negative status [ER-]: Secondary | ICD-10-CM | POA: Diagnosis present

## 2023-09-05 DIAGNOSIS — M25611 Stiffness of right shoulder, not elsewhere classified: Secondary | ICD-10-CM | POA: Diagnosis present

## 2023-09-05 NOTE — Therapy (Signed)
OUTPATIENT PHYSICAL THERAPY BREAST CANCER POST OP TREATMENT   Patient Name: Suzanne Tucker MRN: [id number] DOB:10/25/1980, 42 y.o., female Today's Date: 09/05/2023  END OF SESSION:  PT End of Session - 09/05/23 1613     Visit Number 7    Number of Visits 10    Date for PT Re-Evaluation 09/12/23    PT Start Time 1613    PT Stop Time 1657    PT Time Calculation (min) 44 min    Activity Tolerance Patient tolerated treatment well    Behavior During Therapy [facility] for tasks assessed/performed                Past Medical History:  Diagnosis Date   Anginal pain (HCC) 02/2019   related to MVP   Back pain    Breast cancer (HCC)    Depression    Dysrhythmia    MVP   Mitral prolapse    Sleep apnea    mild no cpap indicated   Past Surgical History:  Procedure Laterality Date   BREAST SURGERY Right 2024   Breast biopsy x 2   CESAREAN SECTION  03/30/2008   MASTECTOMY WITH AXILLARY LYMPH NODE DISSECTION Bilateral 07/27/2023   Procedure: BILATERAL MASTECTOMY WITH RIGHT AXILLARY LYMPH NODE DISSECTION;  Surgeon: [name], MD;  Location: [facility];  Service: General;  Laterality: Bilateral;  PEC BLOCK   PORTACATH PLACEMENT N/A 01/27/2023   Procedure: INSERTION PORT-A-CATH;  Surgeon: [name], MD;  Location: [facility];  Service: General;  Laterality: N/A;  leaving accessed   ROBOTIC ASSISTED TOTAL HYSTERECTOMY Bilateral 03/15/2019   Procedure: XI ROBOTIC ASSISTED TOTAL HYSTERECTOMY with Bilateral Salpingectomy;  Surgeon: Silverio Lay, MD;  Location: [facility];  Service: Gynecology;  Laterality: Bilateral;   TUBAL LIGATION  2017   Patient Active Problem List   Diagnosis Date Noted   Complication, postoperative infection 08/10/2023   Breast cancer of lower-inner quadrant of right female breast (HCC) 07/27/2023   Dysautonomia (HCC) 07/25/2023   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 [name]  REFERRING DIAG: Right breast cancer  THERAPY DIAG:  Stiffness of left shoulder, not elsewhere classified  Stiffness of right shoulder, not elsewhere classified  Cramp and spasm  Aftercare following surgery for neoplasm  Abnormal posture  Malignant neoplasm of lower-inner quadrant of right breast of female, estrogen receptor negative (HCC)  Rationale for Evaluation and Treatment: Rehabilitation  ONSET DATE: 07/27/2023  SUBJECTIVE:  SUBJECTIVE STATEMENT: I have been waking up with pain in my back. I have been up from 7am to 10 pm going all day due to the holidays. The shoulder feels like out of socket pain.   From eval: Patient reports she underwent neoadjuvant chemotherapy from 01/28/2023 - 06/27/2023 followed by a bilateral mastectomy and right axillary lymph node dissection (8 of 20 nodes positive) on 07/27/2023. She is triple negative and will undergo radiation on the right side. She is having a PET scan to ensure there are no metastases. She had a breast infection and was hospitalized last week from 08/10/2023 - 08/13/2023. She is on antibiotics until the end of this week.  PERTINENT HISTORY:  Patient was diagnosed on 01/07/2023 with right grade 3 invasive ductal carcinoma breast cancer. It is triple  negative with a Ki67 of 95%. She had a bilateral mastectomy and right axillary lymph node dissection (8 of 20 nodes positive) on 07/27/2023.  PATIENT GOALS:  Reassess how my recovery is going related to arm function, pain, and swelling.  PAIN: 08/26/2023 Are you having pain? Yes: NPRS scale: 1/10 Pain location: chest and sternum Pain description: tender, stabbing Aggravating factors: blowing leaves, lifting heavy objects Relieving factors: heating pad at thoracic area and shoulder  PRECAUTIONS: Recent Surgery, right UE Lymphedema risk  RED FLAGS: None   ACTIVITY LEVEL / LEISURE: She has not returned to exercising   OBJECTIVE:  Today's Treatment 09/05/23:  Remeasured bilateral shoulder ROM Pulleys x 2 min in direction of flexion and abduction  Ball up wall x 10 reps in to flexion and 10 reps in to bilateral abduction  Supine over 1/2 foam roll: snow angels x 10 reps with increased tightness, alternating shoulder flexion x 10 reps each, bilateral scaption x 10 reps  Educated about lymphedema risk reduction practices  Importance of posture to decrease back pain 08/31/2023 Pendulums x 20 each direction Supine shoulder PROM (flexion, abduction & Rt scapular mobility) bilateral x 10 min Shoulder perturbations at 90 degrees flexion supine Shoulder alphabet A-Z Star gazer in supine 10 reps holding 5 sec at stretch point Ball roll outs with green physio ball x 5 each direction holding 5 sec each Seated bilateral shoulder ER with yellow band 2 x 10 Seated shoulder horizontal abduction 2 x 10 Patient states she only had 20 min available today due to busy schedule   08/22/2023 Supine shoulder PROM (flexion, abduction & Rt scapular mobility) bilateral x 15 min Pulleys (flexion/ abduction) x 2 mins each [facility] on wall x 20 Seated SB stretch  3 way x 8 each direction Standing bow and arrow stretch x 8 each   08/18/2023 Supine shoulder PROM (flexion, abduction & Rt scapular  mobility) B Pulleys (flexion/ abduction) x 2 mins each [facility] on wall x 20  PATIENT SURVEYS:  QUICK DASH:    OBSERVATIONS: Bilateral chest incisions appear to be healing well with steri-strips in place. There is no visible significant swelling present. No redness or signs of infection present. Hypersensitivity present in in chest and along midline at sternum.  POSTURE:  Forward head and rounded shoulders  LYMPHEDEMA ASSESSMENT:   UPPER EXTREMITY AROM/PROM:   A/PROM RIGHT   eval   RIGHT 08/15/2023 RIGHT 09/05/23  Shoulder extension 53 40 76  Shoulder flexion 157 113 148  Shoulder abduction 159 132 145  Shoulder internal rotation 71 74 75  Shoulder external rotation 86 75 78                          (  Blank rows = not tested)   A/PROM LEFT   eval LEFT 08/15/2023 LEFT 09/05/23  Shoulder extension 43 48 78  Shoulder flexion 150 138 158  Shoulder abduction 160 144 149  Shoulder internal rotation 72 70 70  Shoulder external rotation 76 80 77                          (Blank rows = not tested)   CERVICAL AROM: All within normal limits   UPPER EXTREMITY STRENGTH: WFL   LYMPHEDEMA ASSESSMENTS:    LANDMARK RIGHT   eval RIGHT 08/15/2023  10 cm proximal to olecranon process 28.5 27.2  Olecranon process 24.1 23.1  10 cm proximal to ulnar styloid process 23.6 21.7  Just proximal to ulnar styloid process 15.6 15  Across hand at thumb web space 17.7 18  At base of 2nd digit 5.9 5.8  (Blank rows = not tested)   LANDMARK LEFT   eval LEFT 08/15/2023  10 cm proximal to olecranon process 28.6 26.8  Olecranon process 23.8 23.3  10 cm proximal to ulnar styloid process 21.9 21.7  Just proximal to ulnar styloid process 15.4 14.6  Across hand at thumb web space 17.7 17.2  At base of 2nd digit 5.6 5.5  (Blank rows = not tested)  Surgery type/Date: Bilateral mastectomy and right ALND 07/27/2023 Number of lymph nodes removed: 20 Current/past treatment (chemo, radiation,  hormone therapy): Neoadjuvant chemotherapy Other symptoms:  Heaviness/tightness Yes Pain Yes Pitting edema No Infections Yes Decreased scar mobility Yes Stemmer sign No  PATIENT EDUCATION:  Education details: post op HEP (bil AAROM flexion with hands clasped together, scapular retraction, ER with hands behind head, and shoulder abduction crawling up wall) and wall scapular protraction push offs Person educated: Patient Education method: Explanation, Demonstration, Tactile cues, Verbal cues, and Handouts Education comprehension: verbalized understanding and returned demonstration  HOME EXERCISE PROGRAM: Reviewed previously given post op HEP.  ASSESSMENT:  CLINICAL IMPRESSION: Pt demonstrates improvement with bilateral shoulder AROM. She was lifting a lot over the weekend and was overdoing it. Educated pt about lymphedema risk and risk reduction practices. Educated pt about not lifting heavy things she is not used to. Began pec stretches over 1/2 foam roll today and pt felt a good stretch with these. Educated pt about importance of shoulder biomechanics to decrease pain and importance of posture to decrease back and shoulder pain.      Pt will benefit from skilled therapeutic intervention to improve on the following deficits: Decreased knowledge of precautions, impaired UE functional use, pain, decreased ROM, postural dysfunction.   PT treatment/interventions: ADL/Self care home management, [id number]- PT Re-evaluation, 97110-Therapeutic exercises, 97530- Therapeutic activity, 97535- Self Care, 16606- Manual therapy, Patient/Family education, Dry Needling, Manual lymph drainage, and Scar mobilization   GOALS: Goals reviewed with patient? Yes  LONG TERM GOALS:  (STG=LTG)  GOALS Name Target Date  Goal status  1 Pt will demonstrate she has regained shoulder active flexion and abduction ROM back to baseline bilaterally (>/= 150 flexion and >/= 160 abduction)  09/12/2023 INITIAL  2 Patient  will report >/= 50% reduction in skin hypersensitivity for improved tolerance to wearing shirts and bras. 09/12/2023 INITIAL  3 Patient will verbalize good understanding of self scar massage and lymphedema risk reduction practices. 09/12/2023 INITIAL  4 Patient will improve her DASH score to be back to zero (baseline) for improved UE function. 09/12/2023 INITIAL     PLAN:  PT FREQUENCY/DURATION: 2x/week for 4  weeks  PLAN FOR NEXT SESSION: Continue shoulder PROM, Rt scapular mobility, pec stretches, measure for compression sleeve   [facility]  [address], [address]  [place] [address]  [phone]  After Breast Cancer Class It is recommended you attend the ABC class to be educated on lymphedema risk reduction. This class is free of charge and lasts for 1 hour. It is a 1-time class. You will need to download the TEAMS app either on your phone or computer. We will send you a link the night before or the morning of the class. You should be able to click on that link to join the class. This is not a confidential class. You don't have to turn your camera on, but other participants may be able to see your email address. You were given the info in writing since you can't attend the class.  Scar massage You can begin gentle scar massage to you incision sites. Gently place one hand on the incision and move the skin (without sliding on the skin) in various directions. Do this for a few minutes and then you can gently massage either coconut oil or vitamin E cream into the scars. YOU'LL BE READY TO BEGIN THIS WHEN YOUR STERI-STRIPS ARE OFF AND THE INCISION IS CLOSED.  Compression garment YOU DON'T NEED TO WEAR A GARMENT SINCE YOU DON'T HAVE SWELLING.  Home exercise Program Continue doing the exercises you were given until you feel like you can do them without feeling any tightness at the end.   Walking Program Studies show that 30 minutes of walking per day (fast enough to  elevate your heart rate) can significantly reduce the risk of a cancer recurrence. If you can't walk due to other medical reasons, we encourage you to find another activity you could do (like a stationary bike or water exercise).  Posture After breast cancer surgery, people frequently sit with rounded shoulders posture because it puts their incisions on slack and feels better. If you sit like this and scar tissue forms in that position, you can become very tight and have pain sitting or standing with good posture. Try to be aware of your posture and sit and stand up tall to heal properly.  Follow up PT: It is recommended you return every 3 months for the first 3 years following surgery to be assessed on the SOZO machine for an L-Dex score. This helps prevent clinically significant lymphedema in 95% of patients. These follow up screens are 10 minute appointments that you are not billed for.  Victorino Dike B. Fields, PT 09/05/23 5:03 PM [facility] [address] [place], [place] [address] Phone # [phone] Fax [phone]

## 2023-09-05 NOTE — Telephone Encounter (Signed)
Per staff message on 09/05/2023 Patient is aware of rescheduled appointment times/dates for Pharmacy Clinic visit

## 2023-09-06 ENCOUNTER — Other Ambulatory Visit: Payer: Managed Care, Other (non HMO)

## 2023-09-06 ENCOUNTER — Encounter: Payer: Self-pay | Admitting: Hematology and Oncology

## 2023-09-06 ENCOUNTER — Inpatient Hospital Stay: Payer: Managed Care, Other (non HMO) | Admitting: Pharmacist

## 2023-09-06 ENCOUNTER — Encounter: Payer: Self-pay | Admitting: *Deleted

## 2023-09-07 ENCOUNTER — Encounter: Payer: Self-pay | Admitting: Physical Therapy

## 2023-09-07 ENCOUNTER — Ambulatory Visit: Payer: Managed Care, Other (non HMO) | Admitting: Radiation Oncology

## 2023-09-07 ENCOUNTER — Ambulatory Visit: Payer: Managed Care, Other (non HMO) | Admitting: Physical Therapy

## 2023-09-07 ENCOUNTER — Other Ambulatory Visit: Payer: Self-pay

## 2023-09-07 ENCOUNTER — Ambulatory Visit
Admission: RE | Admit: 2023-09-07 | Discharge: 2023-09-07 | Disposition: A | Discharge status: Home / Self Care | Payer: Managed Care, Other (non HMO) | Source: Ambulatory Visit | Attending: Radiation Oncology | Admitting: Radiation Oncology

## 2023-09-07 ENCOUNTER — Inpatient Hospital Stay: Payer: Managed Care, Other (non HMO)

## 2023-09-07 ENCOUNTER — Telehealth: Payer: Self-pay

## 2023-09-07 ENCOUNTER — Inpatient Hospital Stay: Payer: Managed Care, Other (non HMO) | Admitting: Pharmacist

## 2023-09-07 ENCOUNTER — Encounter: Payer: Managed Care, Other (non HMO) | Admitting: Physical Therapy

## 2023-09-07 DIAGNOSIS — M25612 Stiffness of left shoulder, not elsewhere classified: Secondary | ICD-10-CM | POA: Diagnosis not present

## 2023-09-07 DIAGNOSIS — M25611 Stiffness of right shoulder, not elsewhere classified: Secondary | ICD-10-CM

## 2023-09-07 DIAGNOSIS — C50311 Malignant neoplasm of lower-inner quadrant of right female breast: Secondary | ICD-10-CM | POA: Insufficient documentation

## 2023-09-07 DIAGNOSIS — Z171 Estrogen receptor negative status [ER-]: Secondary | ICD-10-CM | POA: Insufficient documentation

## 2023-09-07 DIAGNOSIS — R293 Abnormal posture: Secondary | ICD-10-CM

## 2023-09-07 DIAGNOSIS — Z5111 Encounter for antineoplastic chemotherapy: Secondary | ICD-10-CM | POA: Insufficient documentation

## 2023-09-07 DIAGNOSIS — Z79899 Other long term (current) drug therapy: Secondary | ICD-10-CM | POA: Insufficient documentation

## 2023-09-07 DIAGNOSIS — R252 Cramp and spasm: Secondary | ICD-10-CM

## 2023-09-07 DIAGNOSIS — Z483 Aftercare following surgery for neoplasm: Secondary | ICD-10-CM

## 2023-09-07 LAB — CMP (CANCER CENTER ONLY)
ALT: 8 U/L (ref 0–44)
AST: 15 U/L (ref 15–41)
Albumin: 4 g/dL (ref 3.5–5.0)
Alkaline Phosphatase: 76 U/L (ref 38–126)
Anion gap: 4 — ABNORMAL LOW (ref 5–15)
BUN: 7 mg/dL (ref 6–20)
CO2: 30 mmol/L (ref 22–32)
Calcium: 9.6 mg/dL (ref 8.9–10.3)
Chloride: 107 mmol/L (ref 98–111)
Creatinine: 0.59 mg/dL (ref 0.44–1.00)
GFR, Estimated: >60 mL/min (ref >60)
Glucose, Bld: 94 mg/dL (ref 70–99)
Potassium: 4 mmol/L (ref 3.5–5.1)
Sodium: 141 mmol/L (ref 135–145)
Total Bilirubin: 0.3 mg/dL (ref <1.2)
Total Protein: 6.8 g/dL (ref 6.5–8.1)

## 2023-09-07 LAB — CBC WITH DIFFERENTIAL (CANCER CENTER ONLY)
Abs Immature Granulocytes: 0.03 10*3/uL (ref 0.00–0.07)
Basophils Absolute: 0 10*3/uL (ref 0.0–0.1)
Basophils Relative: 0 %
Eosinophils Absolute: 0.7 10*3/uL — ABNORMAL HIGH (ref 0.0–0.5)
Eosinophils Relative: 15 %
HCT: 30.2 % — ABNORMAL LOW (ref 36.0–46.0)
Hemoglobin: 9.6 g/dL — ABNORMAL LOW (ref 12.0–15.0)
Immature Granulocytes: 1 %
Lymphocytes Relative: 17 %
Lymphs Abs: 0.8 10*3/uL (ref 0.7–4.0)
MCH: 29.6 pg (ref 26.0–34.0)
MCHC: 31.8 g/dL (ref 30.0–36.0)
MCV: 93.2 fL (ref 80.0–100.0)
Monocytes Absolute: 0.3 10*3/uL (ref 0.1–1.0)
Monocytes Relative: 6 %
Neutro Abs: 3 10*3/uL (ref 1.7–7.7)
Neutrophils Relative %: 61 %
Platelet Count: 251 10*3/uL (ref 150–400)
RBC: 3.24 MIL/uL — ABNORMAL LOW (ref 3.87–5.11)
RDW: 13 % (ref 11.5–15.5)
WBC Count: 4.8 10*3/uL (ref 4.0–10.5)
nRBC: 0 % (ref 0.0–0.2)

## 2023-09-07 LAB — RAD ONC ARIA SESSION SUMMARY

## 2023-09-07 MED ORDER — LIDOCAINE-PRILOCAINE 2.5-2.5 % EX CREA: TOPICAL_CREAM | CUTANEOUS | 3 refills | Status: DC

## 2023-09-07 NOTE — Telephone Encounter (Signed)
Voicemail left today regarding enrollment for topical diclofenac study (IRB [phone]). Patient instructed to call back to inquire further for additional enrollment details.   Jerry Caras, PharmD PGY2 Oncology Pharmacy Resident  Principal Investigator  [phone]  09/07/2023 2:09 PM

## 2023-09-07 NOTE — Progress Notes (Signed)
[facility]       Telephone: [phone]?Fax: [phone]   Oncology Clinical Pharmacist Practitioner Initial Assessment  Suzanne Tucker is a 42 y.o. female with a diagnosis of breast cancer. They were contacted today via in-person visit.  Indication/Regimen Capecitabine (Xeloda) is being used appropriately for treatment of breast cancer by Dr. Serena Croissant.      Wt Readings from Last 1 Encounters:  08/26/23 136 lb 6.4 oz (61.9 kg)    Estimated body surface area is 1.63 meters squared as calculated from the following:   Height as of 08/26/23: 5\' 1"  (1.549 m).   Weight as of 08/26/23: 136 lb 6.4 oz (61.9 kg).  The dosing regimen is 2 tablets (1000 mg) by mouth in the morning and 2 tablets (1000 mg) in the evening on days of radiation, then 14 days on, 7 days off. This is being given  in combination with pembrolizumab . It is planned to continue until  6 months in the adjuvant setting per the Create-X trial data . Prescription dose and frequency assessed for appropriateness.  Patient has agreed to treatment which is documented in physician note on 08/26/23. Counseled patient on administration, dosing, side effects, monitoring, drug-food interactions, safe handling, storage, and disposal.  [name] was seen today by clinical pharmacy to establish care on capecitabine per Dr. [name]. She started capecitabine today and will take on days of radiation per Dr. [name], then 14 out of 21 days after radiation is complete to finish 6 months of capecitabine therapy in the adjuvant setting. She continues on pembrolizumab in the adjuvant setting every 3 weeks, next due 09/16/23  Dose Modifications Per Dr. [name], 1000 mg BID on days of radiation. Once radiation finishes, 1000 mg BID 14 out of 21 days to finish 6 months of therapy in the adjuvant setting   Access Assessment Mackinzie Taea Delange will be receiving capecitabine through [facility] Insurance Concerns: none Start date if known: 09/07/23  Adherence Assessment Reviewed importance on keeping a med schedule and plan for any missed doses Barriers to adherence identified? No  Allergies Allergies  Allergen Reactions   Morphine Anaphylaxis and Shortness Of Breath   Tape Other (See Comments)    NEEDS DRESSINGS FOR SENSITIVE SKIN!!!   Tessalon [Benzonatate] Hives, Itching and Other (See Comments)    Hives, itching, burning all over.    Hydrocodone Hives   Latex Rash   Antihistamines, Loratadine-Type Other (See Comments)    Patient reports numbness of the tongue   Codeine Nausea And Vomiting   Kiwi Extract Swelling and Other (See Comments)    Mouth tingles, too   Oxycontin [Oxycodone] Other (See Comments)    RPh has recommended this not be taken  (Oxycontin)   Tramadol Swelling and Other (See Comments)    Tingling in the mouth with swelling     Vitals    09/07/2023    9:57 AM 08/26/2023   10:47 AM 08/24/2023   12:56 PM  Oncology Vitals  Height  155 cm 155 cm  Weight 63.322 kg 61.871 kg 63.504 kg  Weight (lbs) 139 lbs 10 oz 136 lbs 6 oz 140 lbs  BMI 26.38 kg/m2 25.77 kg/m2 26.45 kg/m2  Temp 98.3 F (36.8 C) 98.2 F (36.8 C) 98 F (36.7 C)  Pulse Rate 79 83 98  BP 129/75 123/74 113/78  Resp 16 18 18   SpO2 100 % 100 % 100 %  BSA (m2) 1.65 m2 1.63 m2 1.65  m2     Laboratory Data    Latest Ref Rng & Units 09/07/2023    8:54 AM 08/26/2023   10:20 AM 08/11/2023    3:53 AM  CBC EXTENDED  WBC 4.0 - 10.5 K/uL 4.8  4.7  10.0   RBC 3.87 - 5.11 MIL/uL 3.24  3.18  3.11   Hemoglobin 12.0 - 15.0 g/dL 9.6  9.5  9.7   HCT 96.0 - 46.0 % 30.2  29.3  30.5   Platelets 150 - 400 K/uL 251  227  218   NEUT# 1.7 - 7.7 K/uL 3.0  2.4    Lymph# 0.7 - 4.0 K/uL 0.8  0.9         Latest Ref Rng & Units 09/07/2023    8:54 AM 08/26/2023   10:20 AM 08/11/2023    3:53 AM  CMP  Glucose 70 - 99 mg/dL 94  99  454   BUN 6 - 20 mg/dL 7  9  7    Creatinine 0.44 - 1.00  mg/dL 0.98  1.19  1.47   Sodium 135 - 145 mmol/L 141  137  137   Potassium 3.5 - 5.1 mmol/L 4.0  3.9  3.5   Chloride 98 - 111 mmol/L 107  102  105   CO2 22 - 32 mmol/L 30  29  21    Calcium 8.9 - 10.3 mg/dL 9.6  9.7  8.8   Total Protein 6.5 - 8.1 g/dL 6.8  7.3    Total Bilirubin <1.2 mg/dL 0.3  0.4    Alkaline Phos 38 - 126 U/L 76  70    AST 15 - 41 U/L 15  16    ALT 0 - 44 U/L 8  9     Contraindications Contraindications were reviewed? Yes Contraindications to therapy were identified? No   Safety Precautions The following safety precautions for the use of capecitabine were reviewed:  Fever: reviewed the importance of having a thermometer and the Centers for Disease Control and Prevention (CDC) definition of fever which is 100.66F (38C) or higher. Patient should call 24/7 triage at [phone] if experiencing a fever or any other symptoms Decreased white blood cells (WBCs) and increased risk for infection Decreased hemoglobin, part of the red blood cells that carry iron and oxygen Diarrhea Mouth irritation or sores Pain or discomfort in hands and/or feet Changes in liver function Nausea or vomiting Fatigue Abdominal pain Decreased platelet count and increased risk of bleeding Interactions with warfarin Cardiotoxicity Kidney injury Dehydration Alopecia Handling body fluids and waste Pregnancy, sexual activity, and contraception Storage and Handling To be taken within 30 minutes of a meal Missed doses  Medication Reconciliation Current Outpatient Medications  Medication Sig Dispense Refill   acetaminophen (TYLENOL) 500 MG tablet Take 1,000 mg by mouth every 6 (six) hours as needed for fever or mild pain (pain score 1-3).     calcium carbonate (TUMS - DOSED IN MG ELEMENTAL CALCIUM) 500 MG chewable tablet Chew 1,000 mg by mouth as needed for indigestion or heartburn.     capecitabine (XELODA) 500 MG tablet TAKE 2 TABS (1000 MG) BY MOUTH EVERY 12 HOURS ONLY ON DAYS OF  RADATION (M - F). Take within 30 minutes of a meal. 132 tablet 0   fluticasone (FLONASE) 50 MCG/ACT nasal spray Place 2 sprays into both nostrils daily. (Patient taking differently: Place 2 sprays into both nostrils daily as needed for allergies or rhinitis.) 16 g 0   Ibuprofen 200 MG CAPS Take  200-400 mg by mouth every 6 (six) hours as needed (for mild pain or fever).     Multiple Vitamin (MULTIVITAMIN) tablet Take 1 tablet by mouth daily.     lidocaine-prilocaine (EMLA) cream Apply to affected area once 30 g 3   ondansetron (ZOFRAN) 8 MG tablet Take 1 tablet (8 mg total) by mouth every 8 (eight) hours as needed for nausea or vomiting. Start on the third day after chemotherapy. (Patient not taking: Reported on 09/07/2023) 30 tablet 1   No current facility-administered medications for this visit.    Medication reconciliation is based on the patient's most recent medication list in the electronic medical record (EMR) including herbal products and OTC medications.   The patient's medication list was reviewed today with the patient? Yes   Drug-drug interactions (DDIs) DDIs were evaluated? Yes Significant DDIs identified? No   Drug-Food Interactions Drug-food interactions were evaluated? Yes Drug-food interactions identified? No   Follow-up Plan  Patient education handout given to patient Start capecitabine 1000 mg by mouth BID on days of radiation. Started today at 8 am. Then 14 out of 21 days to finish 6 months of therapy in the adjuvant setting once radiation has completed. Continue pembrolizumab 200 mg every 3 weeks per Dr. [name]. Next due 09/16/23 Labs, Lillard Anes, and pembrolizumab scheduled for 09/16/23 and 10/06/22 Ms. Kooi is interested in the diclofenac study. Will send to [facility] for next steps Ms. Creppel can follow up with clinical pharmacy as needed going forward.  Alison Murray Wetherbee participated in the discussion, expressed understanding, and voiced  agreement with the above plan. All questions were answered to her satisfaction. The patient was advised to contact the clinic at [phone] with any questions or concerns prior to her return visit.   I spent 60 minutes assessing the patient.  Vernella Niznik A. Odetta Pink, PharmD, BCOP, CPP  Anselm Lis, RPH-CPP, 09/07/2023 8:57 AM  **Disclaimer: This note was dictated with voice recognition software. Similar sounding words can inadvertently be transcribed and this note may contain transcription errors which may not have been corrected upon publication of note.**

## 2023-09-07 NOTE — Therapy (Signed)
OUTPATIENT PHYSICAL THERAPY BREAST CANCER POST OP TREATMENT   Patient Name: Suzanne Tucker MRN: [id number] DOB:December 31, 1980, 42 y.o., female Today's Date: 09/07/2023  END OF SESSION:  PT End of Session - 09/07/23 1507     Visit Number 8    Number of Visits 10    Date for PT Re-Evaluation 09/12/23    PT Start Time 1506    PT Stop Time 1543    PT Time Calculation (min) 37 min    Activity Tolerance Patient tolerated treatment well    Behavior During Therapy [facility] for tasks assessed/performed                Past Medical History:  Diagnosis Date   Anginal pain (HCC) 02/2019   related to MVP   Back pain    Breast cancer (HCC)    Depression    Dysrhythmia    MVP   Mitral prolapse    Sleep apnea    mild no cpap indicated   Past Surgical History:  Procedure Laterality Date   BREAST SURGERY Right 2024   Breast biopsy x 2   CESAREAN SECTION  03/30/2008   MASTECTOMY WITH AXILLARY LYMPH NODE DISSECTION Bilateral 07/27/2023   Procedure: BILATERAL MASTECTOMY WITH RIGHT AXILLARY LYMPH NODE DISSECTION;  Surgeon: [name], MD;  Location: [facility];  Service: General;  Laterality: Bilateral;  PEC BLOCK   PORTACATH PLACEMENT N/A 01/27/2023   Procedure: INSERTION PORT-A-CATH;  Surgeon: [name], MD;  Location: [facility];  Service: General;  Laterality: N/A;  leaving accessed   ROBOTIC ASSISTED TOTAL HYSTERECTOMY Bilateral 03/15/2019   Procedure: XI ROBOTIC ASSISTED TOTAL HYSTERECTOMY with Bilateral Salpingectomy;  Surgeon: Silverio Lay, MD;  Location: [facility];  Service: Gynecology;  Laterality: Bilateral;   TUBAL LIGATION  2017   Patient Active Problem List   Diagnosis Date Noted   Complication, postoperative infection 08/10/2023   Breast cancer of lower-inner quadrant of right female breast (HCC) 07/27/2023   Dysautonomia (HCC) 07/25/2023   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 [name]  REFERRING DIAG: Right breast cancer  THERAPY DIAG:  Stiffness of left shoulder, not elsewhere classified  Stiffness of right shoulder, not elsewhere classified  Cramp and spasm  Aftercare following surgery for neoplasm  Abnormal posture  Malignant neoplasm of lower-inner quadrant of right breast of female, estrogen receptor negative (HCC)  Rationale for Evaluation and Treatment: Rehabilitation  ONSET DATE: 07/27/2023  SUBJECTIVE:  SUBJECTIVE STATEMENT: The shoulder is really painful today. It is still in the joint.   From eval: Patient reports she underwent neoadjuvant chemotherapy from 01/28/2023 - 06/27/2023 followed by a bilateral mastectomy and right axillary lymph node dissection (8 of 20 nodes positive) on 07/27/2023. She is triple negative and will undergo radiation on the right side. She is having a PET scan to ensure there are no metastases. She had a breast infection and was hospitalized last week from 08/10/2023 - 08/13/2023. She is on antibiotics until the end of this week.  PERTINENT HISTORY:  Patient was diagnosed on 01/07/2023 with right grade 3 invasive ductal carcinoma breast cancer. 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 She had a bilateral mastectomy and right axillary lymph node  dissection (8 of 20 nodes positive) on 07/27/2023.  PATIENT GOALS:  Reassess how my recovery is going related to arm function, pain, and swelling.  PAIN: 08/26/2023 Are you having pain? Yes: NPRS scale: 4/10 Pain location: R shoulder Pain description: aching, stabbing Aggravating factors: nothing Relieving factors: sitting still   PRECAUTIONS: Recent Surgery, right UE Lymphedema risk  RED FLAGS: None   ACTIVITY LEVEL / LEISURE: She has not returned to exercising   OBJECTIVE:  Today's Treatment 09/07/23: In L sidelying to R scapula in to protraction and retraction and then in all directions to improve mobility and decrease pain with pt initially guarded and required v/c to relax In supine: A-P joint mobs grade 2-3 x 5 min to decrease pain In standing: yellow band attached to arm of treadmill: isometric walkouts with yellow theraband in to flexion, extension, ER and IR x 10 reps each with no pain and v/c and t/c for correct posture - issued these as part of an HEP 09/05/23:  Remeasured bilateral shoulder ROM Pulleys x 2 min in direction of flexion and abduction  Ball up wall x 10 reps in to flexion and 10 reps in to bilateral abduction  Supine over 1/2 foam roll: snow angels x 10 reps with increased tightness, alternating shoulder flexion x 10 reps each, bilateral scaption x 10 reps  Educated about lymphedema risk reduction practices  Importance of posture to decrease back pain 08/31/2023 Pendulums x 20 each direction Supine shoulder PROM (flexion, abduction & Rt scapular mobility) bilateral x 10 min Shoulder perturbations at 90 degrees flexion supine Shoulder alphabet A-Z Star gazer in supine 10 reps holding 5 sec at stretch point Ball roll outs with green physio ball x 5 each direction holding 5 sec each Seated bilateral shoulder ER with yellow band 2 x 10 Seated shoulder horizontal abduction 2 x 10 Patient states she only had 20 min available today due to busy  schedule   08/22/2023 Supine shoulder PROM (flexion, abduction & Rt scapular mobility) bilateral x 15 min Pulleys (flexion/ abduction) x 2 mins each [facility] on wall x 20 Seated SB stretch  3 way x 8 each direction Standing bow and arrow stretch x 8 each   08/18/2023 Supine shoulder PROM (flexion, abduction & Rt scapular mobility) B Pulleys (flexion/ abduction) x 2 mins each [facility] on wall x 20  PATIENT SURVEYS:  QUICK DASH:    OBSERVATIONS: Bilateral chest incisions appear to be healing well with steri-strips in place. There is no visible significant swelling present. No redness or signs of infection present. Hypersensitivity present in in chest and along midline at sternum.  POSTURE:  Forward head and rounded shoulders  LYMPHEDEMA ASSESSMENT:   UPPER EXTREMITY AROM/PROM:  A/PROM RIGHT   eval   RIGHT 08/15/2023 RIGHT 09/05/23  Shoulder extension 53 40 76  Shoulder flexion 157 113 148  Shoulder abduction 159 132 145  Shoulder internal rotation 71 74 75  Shoulder external rotation 86 75 78                          (Blank rows = not tested)   A/PROM LEFT   eval LEFT 08/15/2023 LEFT 09/05/23  Shoulder extension 43 48 78  Shoulder flexion 150 138 158  Shoulder abduction 160 144 149  Shoulder internal rotation 72 70 70  Shoulder external rotation 76 80 77                          (Blank rows = not tested)   CERVICAL AROM: All within normal limits   UPPER EXTREMITY STRENGTH: WFL   LYMPHEDEMA ASSESSMENTS:    LANDMARK RIGHT   eval RIGHT 08/15/2023  10 cm proximal to olecranon process 28.5 27.2  Olecranon process 24.1 23.1  10 cm proximal to ulnar styloid process 23.6 21.7  Just proximal to ulnar styloid process 15.6 15  Across hand at thumb web space 17.7 18  At base of 2nd digit 5.9 5.8  (Blank rows = not tested)   LANDMARK LEFT   eval LEFT 08/15/2023  10 cm proximal to olecranon process 28.6 26.8  Olecranon process 23.8 23.3  10 cm proximal  to ulnar styloid process 21.9 21.7  Just proximal to ulnar styloid process 15.4 14.6  Across hand at thumb web space 17.7 17.2  At base of 2nd digit 5.6 5.5  (Blank rows = not tested)  Surgery type/Date: Bilateral mastectomy and right ALND 07/27/2023 Number of lymph nodes removed: 20 Current/past treatment (chemo, radiation, hormone therapy): Neoadjuvant chemotherapy Other symptoms:  Heaviness/tightness Yes Pain Yes Pitting edema No Infections Yes Decreased scar mobility Yes Stemmer sign No  PATIENT EDUCATION:  Education details: post op HEP (bil AAROM flexion with hands clasped together, scapular retraction, ER with hands behind head, and shoulder abduction crawling up wall) and wall scapular protraction push offs Person educated: Patient Education method: Explanation, Demonstration, Tactile cues, Verbal cues, and Handouts Education comprehension: verbalized understanding and returned demonstration  HOME EXERCISE PROGRAM: Reviewed previously given post op HEP. Access Code: [email or web address] URL: [email or web address] Date: 09/07/2023 Prepared by: Leonette Most  Exercises - Shoulder Internal Rotation Reactive Isometrics  - 1 x daily - 7 x weekly - 1-3 sets - 10 reps - Shoulder External Rotation Reactive Isometrics (Mirrored)  - 1 x daily - 7 x weekly - 1-3 sets - 10 reps - Shoulder Extension Reactive Isometrics with Elbow Extended  - 1 x daily - 7 x weekly - 1-3 sets - 10 reps - Standing Shoulder Flexion Reactive Isometrics with Elbow Extended  - 1 x daily - 7 x weekly - 1-3 sets - 10 reps - Scapular Retraction with Resistance  - 1 x daily - 7 x weekly - 1-3 sets - 10 reps ASSESSMENT:  CLINICAL IMPRESSION: Pt reports she is still having R shoulder pain especially when reaching backward or doing IR. Began A-P joint mobs to R shoulder with decreased pain with this. Instructed pt in resisted isometric walkouts today with yellow theraband and pt had decreased pain in  her R shoulder when putting on her jacket at end of session. Added isometrics to her HEP. She had to end session early secondary  to radiation appt.      Pt will benefit from skilled therapeutic intervention to improve on the following deficits: Decreased knowledge of precautions, impaired UE functional use, pain, decreased ROM, postural dysfunction.   PT treatment/interventions: ADL/Self care home management, [id number]- PT Re-evaluation, 97110-Therapeutic exercises, 97530- Therapeutic activity, 97535- Self Care, 27253- Manual therapy, Patient/Family education, Dry Needling, Manual lymph drainage, and Scar mobilization   GOALS: Goals reviewed with patient? Yes  LONG TERM GOALS:  (STG=LTG)  GOALS Name Target Date  Goal status  1 Pt will demonstrate she has regained shoulder active flexion and abduction ROM back to baseline bilaterally (>/= 150 flexion and >/= 160 abduction)  09/12/2023 INITIAL  2 Patient will report >/= 50% reduction in skin hypersensitivity for improved tolerance to wearing shirts and bras. 09/12/2023 INITIAL  3 Patient will verbalize good understanding of self scar massage and lymphedema risk reduction practices. 09/12/2023 INITIAL  4 Patient will improve her DASH score to be back to zero (baseline) for improved UE function. 09/12/2023 INITIAL     PLAN:  PT FREQUENCY/DURATION: 2x/week for 4 weeks  PLAN FOR NEXT SESSION: Continue shoulder PROM, Rt scapular mobility, pec stretches, joint mobs, measure for compression sleeve   [facility]  [address], [address]  [place] [address]  [phone]  After Breast Cancer Class It is recommended you attend the ABC class to be educated on lymphedema risk reduction. This class is free of charge and lasts for 1 hour. It is a 1-time class. You will need to download the TEAMS app either on your phone or computer. We will send you a link the night before or the morning of the class. You should be able to click  on that link to join the class. This is not a confidential class. You don't have to turn your camera on, but other participants may be able to see your email address. You were given the info in writing since you can't attend the class.  Scar massage You can begin gentle scar massage to you incision sites. Gently place one hand on the incision and move the skin (without sliding on the skin) in various directions. Do this for a few minutes and then you can gently massage either coconut oil or vitamin E cream into the scars. YOU'LL BE READY TO BEGIN THIS WHEN YOUR STERI-STRIPS ARE OFF AND THE INCISION IS CLOSED.  Compression garment YOU DON'T NEED TO WEAR A GARMENT SINCE YOU DON'T HAVE SWELLING.  Home exercise Program Continue doing the exercises you were given until you feel like you can do them without feeling any tightness at the end.   Walking Program Studies show that 30 minutes of walking per day (fast enough to elevate your heart rate) can significantly reduce the risk of a cancer recurrence. If you can't walk due to other medical reasons, we encourage you to find another activity you could do (like a stationary bike or water exercise).  Posture After breast cancer surgery, people frequently sit with rounded shoulders posture because it puts their incisions on slack and feels better. If you sit like this and scar tissue forms in that position, you can become very tight and have pain sitting or standing with good posture. Try to be aware of your posture and sit and stand up tall to heal properly.  Follow up PT: It is recommended you return every 3 months for the first 3 years following surgery to be assessed on the SOZO machine for an  L-Dex score. This helps prevent clinically significant lymphedema in 95% of patients. These follow up screens are 10 minute appointments that you are not billed for.  Victorino Dike B. Fields, PT 09/07/23 3:55 PM [facility] [address] [place], [place] [address] Phone # [phone] Fax [phone]

## 2023-09-08 ENCOUNTER — Inpatient Hospital Stay: Payer: Managed Care, Other (non HMO) | Admitting: Pharmacist

## 2023-09-08 ENCOUNTER — Ambulatory Visit: Payer: Managed Care, Other (non HMO)

## 2023-09-08 ENCOUNTER — Other Ambulatory Visit: Payer: Self-pay

## 2023-09-08 ENCOUNTER — Ambulatory Visit
Admission: RE | Admit: 2023-09-08 | Discharge: 2023-09-08 | Disposition: A | Discharge status: Home / Self Care | Payer: Managed Care, Other (non HMO) | Source: Ambulatory Visit | Attending: Radiation Oncology | Admitting: Radiation Oncology

## 2023-09-08 ENCOUNTER — Other Ambulatory Visit (HOSPITAL_COMMUNITY): Payer: Self-pay

## 2023-09-08 ENCOUNTER — Encounter: Payer: Self-pay | Admitting: Hematology and Oncology

## 2023-09-08 DIAGNOSIS — Z171 Estrogen receptor negative status [ER-]: Secondary | ICD-10-CM

## 2023-09-08 DIAGNOSIS — C50311 Malignant neoplasm of lower-inner quadrant of right female breast: Secondary | ICD-10-CM | POA: Diagnosis not present

## 2023-09-08 LAB — RAD ONC ARIA SESSION SUMMARY
Course Elapsed Days: 1
Plan Fractions Treated to Date: 1
Plan Fractions Treated to Date: 2
Plan Prescribed Dose Per Fraction: 1.8 Gy
Plan Prescribed Dose Per Fraction: 1.8 Gy
Plan Total Fractions Prescribed: 14
Plan Total Fractions Prescribed: 28
Plan Total Prescribed Dose: 25.2 Gy
Plan Total Prescribed Dose: 50.4 Gy
Reference Point Dosage Given to Date: 3.6 Gy
Reference Point Dosage Given to Date: 3.6 Gy
Reference Point Session Dosage Given: 1.8 Gy
Reference Point Session Dosage Given: 1.8 Gy
Session Number: 2

## 2023-09-08 MED ORDER — ONDANSETRON HCL 8 MG PO TABS: 8.0000 mg | ORAL_TABLET | Freq: Three times a day (TID) | ORAL | 2 refills | Status: DC | PRN

## 2023-09-08 MED ORDER — DICLOFENAC SODIUM 1 % EX GEL
CUTANEOUS | 0 refills | Status: DC
  Filled 2023-09-08: qty 400, 84d supply, fill #0

## 2023-09-08 MED ORDER — PROCHLORPERAZINE MALEATE 10 MG PO TABS: 10.0000 mg | ORAL_TABLET | Freq: Four times a day (QID) | ORAL | 2 refills | Status: DC | PRN

## 2023-09-08 NOTE — Progress Notes (Signed)
[name]       Telephone: [phone]?Fax: [phone]   Oncology Clinical Pharmacist Practitioner Progress Note  Suzanne Tucker is a 42 y.o. female with a diagnosis of breast cancer currently on adjuvant capecitabine under the care of Dr. Serena Croissant.   I connected with Suzanne Tucker today by telephone and verified that I was speaking with the correct person using two patient identifiers. I discussed the limitations, risks, security and privacy concerns of performing an evaluation and management service by telemedicine and the availability of in-person appointments. The patient/caregiver expressed understanding and agreed to proceed.  Other persons participating in the visit and their role in the encounter: none   Patient's location: home  Provider's location: clinic  Ms. Kiel left patient message via EMR describing some nausea and vomiting this morning at 2:30 am. She took her OTC Nauzene and feels better today. We discussed prescribing ondansetron (Zofran) and prochlorperazine (Compazine) for her should she have nausea again. Instructions on how to take this medication were reviewed and prescriptions were sent to her pharmacy of choice. She sees Suzanne Anes NP on 09/16/23 with labs when she is next due for pembrolizumab.   Suzanne Tucker participated in the discussion, expressed understanding, and voiced agreement with the above plan. All questions were answered to her satisfaction. The patient was advised to contact the clinic at [phone] with any questions or concerns prior to her return visit.  Clinical pharmacy will continue to support [facility] and Dr. Serena Croissant as needed.  Talley Casco A. Odetta Pink, PharmD, BCOP, CPP  Anselm Lis, RPH-CPP,  09/08/2023  12:46 PM   **Disclaimer: This note was dictated with voice recognition software. Similar sounding words can inadvertently be transcribed and this note  may contain transcription errors which may not have been corrected upon publication of note.**

## 2023-09-08 NOTE — Progress Notes (Signed)
Research Note - Consent Authorization   Study Name: Evaluation of Topical Diclofenac Use on the Incidence and Severity of Hand Foot Syndrome in Patients with Breast and Gastrointestinal Malignancies Taking Capecitabine   IRB# [id number]   A summary of the study was presented to the patient, including potential risks and benefits from study participation, alternatives treatment options available other than study participation, how their confidentiality will be maintained, and who to contact if they had questions regarding their rights as a study participant or if they experience an injury associated with their participation. They were told that participation is completely voluntary and choosing not to participate would not impact care they would otherwise have access to. They were also told that even if they choose to participate, they can withdraw their consent to participate at any time in the future. Any questions the patient asked were addressed by a member of the research team.   After having all of their questions answered, the patient agreed to participate in the study by confirming their willingness to consent to participation verbally over the phone on 09/08/2023 at 12:32 PM.     Jerry Caras, PharmD PGY2 Oncology Pharmacy Resident  Principal Investigator  [phone]

## 2023-09-09 ENCOUNTER — Other Ambulatory Visit: Payer: Self-pay

## 2023-09-09 ENCOUNTER — Ambulatory Visit: Payer: Managed Care, Other (non HMO)

## 2023-09-09 ENCOUNTER — Ambulatory Visit
Admission: RE | Admit: 2023-09-09 | Discharge: 2023-09-09 | Disposition: A | Discharge status: Home / Self Care | Payer: Managed Care, Other (non HMO) | Source: Ambulatory Visit | Attending: Radiation Oncology | Admitting: Radiation Oncology

## 2023-09-09 DIAGNOSIS — C50311 Malignant neoplasm of lower-inner quadrant of right female breast: Secondary | ICD-10-CM | POA: Diagnosis not present

## 2023-09-09 LAB — RAD ONC ARIA SESSION SUMMARY
Course Elapsed Days: 2
Plan Fractions Treated to Date: 2
Plan Prescribed Dose Per Fraction: 1.8 Gy
Plan Total Fractions Prescribed: 14
Plan Total Prescribed Dose: 25.2 Gy
Reference Point Dosage Given to Date: 5.4 Gy
Reference Point Session Dosage Given: 1.8 Gy
Session Number: 3

## 2023-09-11 ENCOUNTER — Ambulatory Visit
Admission: RE | Admit: 2023-09-11 | Discharge: 2023-09-11 | Disposition: A | Discharge status: Home / Self Care | Payer: Managed Care, Other (non HMO) | Source: Ambulatory Visit | Attending: Family Medicine | Admitting: Family Medicine

## 2023-09-11 ENCOUNTER — Other Ambulatory Visit: Payer: Self-pay

## 2023-09-11 ENCOUNTER — Ambulatory Visit (HOSPITAL_COMMUNITY): Payer: Managed Care, Other (non HMO)

## 2023-09-11 VITALS — BP 107/76 | HR 98 | Temp 98.8°F

## 2023-09-11 DIAGNOSIS — B349 Viral infection, unspecified: Secondary | ICD-10-CM | POA: Diagnosis not present

## 2023-09-11 DIAGNOSIS — R110 Nausea: Secondary | ICD-10-CM | POA: Diagnosis not present

## 2023-09-11 NOTE — ED Triage Notes (Signed)
Patient presents to Urgent Care with complaints of nausea, vomiting since 4 days ago. Patient reports started a new chemotherapy pill but has noticed her daughter, coworker was having similar symptoms. At night having low grade temp and nasal congestion and fatigued. Took over the counter nausea medication (Nauzeen). Does have anti-nausea meds at home

## 2023-09-11 NOTE — Discharge Instructions (Signed)
Make sure that you are drinking plenty of fluids Take Zofran for any significant nausea and vomiting

## 2023-09-11 NOTE — ED Provider Notes (Signed)
[facility]    CSN: [id number] Arrival date & time: 09/11/23  1057      History   Chief Complaint Chief Complaint  Patient presents with   Nausea    Nausea and vomiting since Wednesday.  Fever 99.1 only when I get stomach pain, thought it was my chemo pill I started Wednesday but several people have same sickness as me like my daughter - Entered by patient    HPI Suzanne Tucker is a 42 y.o. female.   HPI  Patient has had nausea and vomiting intermittently since Wednesday.  It is worse in the evening.  She has not taken any medication.  She did feel like she had a fever, her temperature was 99.1.  When she feels like she is going to throw up she does get some cramping in her abdomen.  Not currently having any nausea or abdominal pain.  She is concerned because there is a lot of illness going through the school.  She is a Runner, broadcasting/film/video.  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   Antineoplastic chemotherapy induced pancytopenia (HCC) 05/02/2023   Neutropenic fever (HCC) 04/30/2023   Neutropenia (HCC) 04/29/2023   Genetic testing 02/08/2023   Port-A-Cath in place 01/28/2023   Malignant neoplasm of lower-inner quadrant of right breast of female, estrogen receptor negative (HCC) 01/17/2023   Axillary mass, right 12/28/2022   Palpitations 11/23/2022   Dizzy spells 03/04/2022   Carpal tunnel  syndrome on right 11/02/2017   MVP (mitral valve prolapse) 10/28/2017    Past Surgical History:  Procedure Laterality Date   BREAST SURGERY Right 2024   Breast biopsy x 2   CESAREAN SECTION  03/30/2008   MASTECTOMY WITH AXILLARY LYMPH NODE DISSECTION Bilateral 07/27/2023   Procedure: BILATERAL MASTECTOMY WITH RIGHT AXILLARY LYMPH NODE DISSECTION;  Surgeon: [name], MD;  Location: [facility];  Service: General;  Laterality: Bilateral;  PEC BLOCK   PORTACATH PLACEMENT N/A 01/27/2023   Procedure: INSERTION PORT-A-CATH;  Surgeon: [name], MD;  Location: [facility];  Service: General;  Laterality: N/A;  leaving accessed   ROBOTIC ASSISTED TOTAL HYSTERECTOMY Bilateral 03/15/2019   Procedure: XI ROBOTIC ASSISTED TOTAL HYSTERECTOMY with Bilateral Salpingectomy;  Surgeon: Silverio Lay, MD;  Location: [facility];  Service: Gynecology;  Laterality: Bilateral;   TUBAL LIGATION  2017    OB History   No obstetric history on file.      Home Medications    Prior to Admission medications   Medication Sig Start Date End Date Taking? Authorizing Provider  acetaminophen (TYLENOL) 500 MG tablet Take 1,000 mg by mouth every 6 (six) hours as needed for fever or mild pain (pain score 1-3).   Yes [provider]  calcium carbonate (TUMS - DOSED IN MG ELEMENTAL CALCIUM) 500 MG chewable tablet Chew 1,000 mg  by mouth as needed for indigestion or heartburn.   Yes [provider]  capecitabine (XELODA) 500 MG tablet TAKE 2 TABS (1000 MG) BY MOUTH EVERY 12 HOURS ONLY ON DAYS OF RADATION (M - F). Take within 30 minutes of a meal. 08/30/23  Yes Serena Croissant, MD  diclofenac Sodium (VOLTAREN) 1 % GEL Research Patient: Apply 0.5 grams (1 fingertip) to each hand and each foot twice daily for up to 12 weeks 09/08/23  Yes Serena Croissant, MD  fluticasone (FLONASE) 50 MCG/ACT nasal spray Place 2 sprays into both nostrils daily. Patient taking differently: Place 2  sprays into both nostrils daily as needed for allergies or rhinitis. 12/02/22  Yes Leath-Warren, Sadie Haber, NP  ondansetron (ZOFRAN) 8 MG tablet Take 1 tablet (8 mg total) by mouth every 8 (eight) hours as needed for nausea or vomiting. 09/08/23  Yes Serena Croissant, MD  Ibuprofen 200 MG CAPS Take 200-400 mg by mouth every 6 (six) hours as needed (for mild pain or fever).    [provider]  lidocaine-prilocaine (EMLA) cream Apply to affected area once 09/07/23   Serena Croissant, MD  Multiple Vitamin (MULTIVITAMIN) tablet Take 1 tablet by mouth daily.    [provider]  prochlorperazine (COMPAZINE) 10 MG tablet Take 1 tablet (10 mg total) by mouth every 6 (six) hours as needed for nausea or vomiting. 09/08/23   Serena Croissant, MD    Family History Family History  Problem Relation Age of Onset   Stroke Mother    Alzheimer's disease Father    Diabetes Maternal Grandmother    Heart disease Maternal Grandmother    Heart attack Maternal Grandmother    Diabetes Maternal Grandfather    Heart disease Maternal Grandfather    Alzheimer's disease Paternal Grandmother    Dementia Paternal Grandmother    Alzheimer's disease Paternal Grandfather    Dementia Paternal Grandfather    Alcohol abuse Paternal Grandfather     Social History Social History   Tobacco Use   Smoking status: Never   Smokeless tobacco: Never  Vaping Use   Vaping status: Never Used  Substance Use Topics   Alcohol use: Not Currently    Comment:     Drug use: No     Allergies   Morphine; Tape; Tessalon [benzonatate]; Hydrocodone; Latex; Antihistamines, loratadine-type; Codeine; Kiwi extract; Oxycontin [oxycodone]; and Tramadol   Review of Systems Review of Systems  See HPI Physical Exam Triage Vital Signs ED Triage Vitals  Encounter Vitals Group     BP 09/11/23 1129 107/76     Systolic BP Percentile --      Diastolic BP Percentile --      Pulse Rate 09/11/23 1129 98     Resp --      Temp  09/11/23 1129 98.8 F (37.1 C)     Temp Source 09/11/23 1129 Oral     SpO2 09/11/23 1129 99 %     Weight --      Height --      Head Circumference --      Peak Flow --      Pain Score 09/11/23 1127 0     Pain Loc --      Pain Education --      Exclude from Growth Chart --    No data found.  Updated Vital Signs BP 107/76 (BP Location: Left Arm)   Pulse 98   Temp 98.8 F (37.1 C) (Oral)   SpO2 99%  Physical Exam Constitutional:      General: She is not in acute distress.    Appearance: She is well-developed. She is not ill-appearing.  HENT:     Head: Normocephalic and atraumatic.     Right Ear: Tympanic membrane and ear canal normal.     Left Ear: Tympanic membrane and ear canal normal.     Nose: Nose normal. No congestion.     Mouth/Throat:     Mouth: Mucous membranes are moist.     Pharynx: No posterior oropharyngeal erythema.  Eyes:     Conjunctiva/sclera: Conjunctivae normal.     Pupils: Pupils are equal, round, and reactive to light.  Cardiovascular:     Rate and Rhythm: Normal rate and regular rhythm.     Heart sounds: Normal heart sounds.  Pulmonary:     Effort: Pulmonary effort is normal. No respiratory distress.     Breath sounds: Normal breath sounds.  Abdominal:     General: Abdomen is flat. There is no distension.     Palpations: Abdomen is soft.     Tenderness: There is no abdominal tenderness.  Musculoskeletal:        General: Normal range of motion.     Cervical back: Normal range of motion.  Skin:    General: Skin is warm and dry.  Neurological:     Mental Status: She is alert.      UC Treatments / Results  Labs (all labs ordered are listed, but only abnormal results are displayed) Labs Reviewed - No data to display  EKG   Radiology No results found.  Procedures Procedures (including critical care time)  Medications Ordered in UC Medications - No data to display  Initial Impression / Assessment and Plan / UC Course  I  have reviewed the triage vital signs and the nursing notes.  Pertinent labs & imaging results that were available during my care of the patient were reviewed by me and considered in my medical decision making (see chart for details).     Physical exam is normal.  She does not appear dehydrated.  She does not have any abdominal tenderness on exam.  I explained this is likely a virus.  I recommended she take the Zofran if she feels nausea.  Needs to work on preventing dehydration.  Return as needed Final Clinical Impressions(s) / UC Diagnoses   Final diagnoses:  Nausea  Viral illness     Discharge Instructions      Make sure that you are drinking plenty of fluids Take Zofran for any significant nausea and vomiting   ED Prescriptions   None    PDMP not reviewed this encounter.   Eustace Moore, MD 09/11/23 [phone]

## 2023-09-12 ENCOUNTER — Other Ambulatory Visit: Payer: Managed Care, Other (non HMO)

## 2023-09-12 ENCOUNTER — Ambulatory Visit: Payer: Managed Care, Other (non HMO)

## 2023-09-12 ENCOUNTER — Ambulatory Visit
Admission: RE | Admit: 2023-09-12 | Discharge: 2023-09-12 | Disposition: A | Discharge status: Home / Self Care | Payer: Managed Care, Other (non HMO) | Source: Ambulatory Visit | Attending: Radiation Oncology

## 2023-09-12 ENCOUNTER — Ambulatory Visit: Payer: Managed Care, Other (non HMO) | Admitting: Pharmacist

## 2023-09-12 ENCOUNTER — Other Ambulatory Visit: Payer: Self-pay

## 2023-09-12 ENCOUNTER — Ambulatory Visit: Payer: Managed Care, Other (non HMO) | Admitting: Physical Therapy

## 2023-09-12 DIAGNOSIS — C50311 Malignant neoplasm of lower-inner quadrant of right female breast: Secondary | ICD-10-CM | POA: Diagnosis not present

## 2023-09-12 LAB — RAD ONC ARIA SESSION SUMMARY
Course Elapsed Days: 5
Plan Fractions Treated to Date: 2
Plan Fractions Treated to Date: 4
Plan Prescribed Dose Per Fraction: 1.8 Gy
Plan Prescribed Dose Per Fraction: 1.8 Gy
Plan Total Fractions Prescribed: 14
Plan Total Fractions Prescribed: 28
Plan Total Prescribed Dose: 25.2 Gy
Plan Total Prescribed Dose: 50.4 Gy
Reference Point Dosage Given to Date: 7.2 Gy
Reference Point Dosage Given to Date: 7.2 Gy
Reference Point Session Dosage Given: 1.8 Gy
Reference Point Session Dosage Given: 1.8 Gy
Session Number: 4

## 2023-09-12 MED ORDER — ALRA NON-METALLIC DEODORANT (RAD-ONC)
1.0000 | Freq: Once | TOPICAL | Status: AC
Start: 2023-09-12 — End: 2023-09-12
  Administered 2023-09-12: 1 via TOPICAL

## 2023-09-12 MED ORDER — RADIAPLEXRX EX GEL
Freq: Once | CUTANEOUS | Status: AC
Start: 2023-09-12 — End: 2023-09-12

## 2023-09-13 ENCOUNTER — Ambulatory Visit
Admission: RE | Admit: 2023-09-13 | Discharge: 2023-09-13 | Disposition: A | Discharge status: Home / Self Care | Payer: Managed Care, Other (non HMO) | Source: Ambulatory Visit | Attending: Radiation Oncology

## 2023-09-13 ENCOUNTER — Ambulatory Visit: Payer: Managed Care, Other (non HMO)

## 2023-09-13 ENCOUNTER — Other Ambulatory Visit: Payer: Self-pay

## 2023-09-13 DIAGNOSIS — C50311 Malignant neoplasm of lower-inner quadrant of right female breast: Secondary | ICD-10-CM | POA: Diagnosis not present

## 2023-09-13 LAB — RAD ONC ARIA SESSION SUMMARY
Course Elapsed Days: 6
Plan Fractions Treated to Date: 3
Plan Fractions Treated to Date: 5
Plan Prescribed Dose Per Fraction: 1.8 Gy
Plan Prescribed Dose Per Fraction: 1.8 Gy
Plan Total Fractions Prescribed: 14
Plan Total Fractions Prescribed: 28
Plan Total Prescribed Dose: 25.2 Gy
Plan Total Prescribed Dose: 50.4 Gy
Reference Point Dosage Given to Date: 9 Gy
Reference Point Dosage Given to Date: 9 Gy
Reference Point Session Dosage Given: 1.8 Gy
Reference Point Session Dosage Given: 1.8 Gy
Session Number: 5

## 2023-09-14 ENCOUNTER — Encounter: Payer: Managed Care, Other (non HMO) | Admitting: Physical Therapy

## 2023-09-14 ENCOUNTER — Ambulatory Visit: Payer: Managed Care, Other (non HMO) | Admitting: Rehabilitation

## 2023-09-14 ENCOUNTER — Ambulatory Visit
Admission: RE | Admit: 2023-09-14 | Discharge: 2023-09-14 | Disposition: A | Discharge status: Home / Self Care | Payer: Managed Care, Other (non HMO) | Source: Ambulatory Visit | Attending: Radiation Oncology | Admitting: Radiation Oncology

## 2023-09-14 ENCOUNTER — Ambulatory Visit: Payer: Managed Care, Other (non HMO)

## 2023-09-14 ENCOUNTER — Other Ambulatory Visit: Payer: Self-pay

## 2023-09-14 DIAGNOSIS — C50311 Malignant neoplasm of lower-inner quadrant of right female breast: Secondary | ICD-10-CM | POA: Diagnosis not present

## 2023-09-14 LAB — RAD ONC ARIA SESSION SUMMARY
Course Elapsed Days: 7
Plan Fractions Treated to Date: 3
Plan Fractions Treated to Date: 6
Plan Prescribed Dose Per Fraction: 1.8 Gy
Plan Prescribed Dose Per Fraction: 1.8 Gy
Plan Total Fractions Prescribed: 14
Plan Total Fractions Prescribed: 28
Plan Total Prescribed Dose: 25.2 Gy
Plan Total Prescribed Dose: 50.4 Gy
Reference Point Dosage Given to Date: 10.8 Gy
Reference Point Dosage Given to Date: 10.8 Gy
Reference Point Session Dosage Given: 1.8 Gy
Reference Point Session Dosage Given: 1.8 Gy
Session Number: 6

## 2023-09-15 ENCOUNTER — Ambulatory Visit
Admission: RE | Admit: 2023-09-15 | Discharge: 2023-09-15 | Disposition: A | Discharge status: Home / Self Care | Payer: Managed Care, Other (non HMO) | Source: Ambulatory Visit | Attending: Radiation Oncology | Admitting: Radiation Oncology

## 2023-09-15 ENCOUNTER — Ambulatory Visit: Payer: Managed Care, Other (non HMO)

## 2023-09-15 ENCOUNTER — Other Ambulatory Visit: Payer: Self-pay

## 2023-09-15 DIAGNOSIS — C50311 Malignant neoplasm of lower-inner quadrant of right female breast: Secondary | ICD-10-CM | POA: Diagnosis not present

## 2023-09-15 LAB — RAD ONC ARIA SESSION SUMMARY
Course Elapsed Days: 8
Plan Fractions Treated to Date: 4
Plan Fractions Treated to Date: 7
Plan Prescribed Dose Per Fraction: 1.8 Gy
Plan Prescribed Dose Per Fraction: 1.8 Gy
Plan Total Fractions Prescribed: 14
Plan Total Fractions Prescribed: 28
Plan Total Prescribed Dose: 25.2 Gy
Plan Total Prescribed Dose: 50.4 Gy
Reference Point Dosage Given to Date: 12.6 Gy
Reference Point Dosage Given to Date: 12.6 Gy
Reference Point Session Dosage Given: 1.8 Gy
Reference Point Session Dosage Given: 1.8 Gy
Session Number: 7

## 2023-09-16 ENCOUNTER — Inpatient Hospital Stay: Payer: Managed Care, Other (non HMO) | Admitting: Dietician

## 2023-09-16 ENCOUNTER — Other Ambulatory Visit: Payer: Self-pay

## 2023-09-16 ENCOUNTER — Other Ambulatory Visit: Payer: Self-pay | Admitting: *Deleted

## 2023-09-16 ENCOUNTER — Inpatient Hospital Stay: Payer: Managed Care, Other (non HMO)

## 2023-09-16 ENCOUNTER — Ambulatory Visit: Payer: Managed Care, Other (non HMO)

## 2023-09-16 ENCOUNTER — Ambulatory Visit
Admission: RE | Admit: 2023-09-16 | Discharge: 2023-09-16 | Disposition: A | Discharge status: Home / Self Care | Payer: Managed Care, Other (non HMO) | Source: Ambulatory Visit | Attending: Radiation Oncology | Admitting: Radiation Oncology

## 2023-09-16 ENCOUNTER — Inpatient Hospital Stay (HOSPITAL_BASED_OUTPATIENT_CLINIC_OR_DEPARTMENT_OTHER): Payer: Managed Care, Other (non HMO) | Admitting: Adult Health

## 2023-09-16 ENCOUNTER — Encounter: Payer: Self-pay | Admitting: Adult Health

## 2023-09-16 VITALS — BP 124/83 | HR 91 | Temp 97.9°F | Resp 16 | Wt 137.2 lb

## 2023-09-16 DIAGNOSIS — Z171 Estrogen receptor negative status [ER-]: Secondary | ICD-10-CM | POA: Diagnosis not present

## 2023-09-16 DIAGNOSIS — C50311 Malignant neoplasm of lower-inner quadrant of right female breast: Secondary | ICD-10-CM

## 2023-09-16 DIAGNOSIS — Z95828 Presence of other vascular implants and grafts: Secondary | ICD-10-CM

## 2023-09-16 LAB — RAD ONC ARIA SESSION SUMMARY
Course Elapsed Days: 9
Plan Fractions Treated to Date: 4
Plan Fractions Treated to Date: 8
Plan Prescribed Dose Per Fraction: 1.8 Gy
Plan Prescribed Dose Per Fraction: 1.8 Gy
Plan Total Fractions Prescribed: 14
Plan Total Fractions Prescribed: 28
Plan Total Prescribed Dose: 25.2 Gy
Plan Total Prescribed Dose: 50.4 Gy
Reference Point Dosage Given to Date: 14.4 Gy
Reference Point Dosage Given to Date: 14.4 Gy
Reference Point Session Dosage Given: 1.8 Gy
Reference Point Session Dosage Given: 1.8 Gy
Session Number: 8

## 2023-09-16 LAB — CBC WITH DIFFERENTIAL (CANCER CENTER ONLY)
Abs Immature Granulocytes: 0.01 10*3/uL (ref 0.00–0.07)
Basophils Absolute: 0 10*3/uL (ref 0.0–0.1)
Basophils Relative: 0 %
Eosinophils Absolute: 0.4 10*3/uL (ref 0.0–0.5)
Eosinophils Relative: 12 %
HCT: 30 % — ABNORMAL LOW (ref 36.0–46.0)
Hemoglobin: 9.9 g/dL — ABNORMAL LOW (ref 12.0–15.0)
Immature Granulocytes: 0 %
Lymphocytes Relative: 21 %
Lymphs Abs: 0.7 10*3/uL (ref 0.7–4.0)
MCH: 29.9 pg (ref 26.0–34.0)
MCHC: 33 g/dL (ref 30.0–36.0)
MCV: 90.6 fL (ref 80.0–100.0)
Monocytes Absolute: 0.3 10*3/uL (ref 0.1–1.0)
Monocytes Relative: 9 %
Neutro Abs: 1.9 10*3/uL (ref 1.7–7.7)
Neutrophils Relative %: 58 %
Platelet Count: 228 10*3/uL (ref 150–400)
RBC: 3.31 MIL/uL — ABNORMAL LOW (ref 3.87–5.11)
RDW: 13.5 % (ref 11.5–15.5)
WBC Count: 3.3 10*3/uL — ABNORMAL LOW (ref 4.0–10.5)
nRBC: 0 % (ref 0.0–0.2)

## 2023-09-16 LAB — CMP (CANCER CENTER ONLY)
ALT: 7 U/L (ref 0–44)
AST: 14 U/L — ABNORMAL LOW (ref 15–41)
Albumin: 4.2 g/dL (ref 3.5–5.0)
Alkaline Phosphatase: 68 U/L (ref 38–126)
Anion gap: 6 (ref 5–15)
BUN: 10 mg/dL (ref 6–20)
CO2: 29 mmol/L (ref 22–32)
Calcium: 9.8 mg/dL (ref 8.9–10.3)
Chloride: 103 mmol/L (ref 98–111)
Creatinine: 0.6 mg/dL (ref 0.44–1.00)
GFR, Estimated: >60 mL/min (ref >60)
Glucose, Bld: 90 mg/dL (ref 70–99)
Potassium: 4 mmol/L (ref 3.5–5.1)
Sodium: 138 mmol/L (ref 135–145)
Total Bilirubin: 0.4 mg/dL (ref <1.2)
Total Protein: 7.3 g/dL (ref 6.5–8.1)

## 2023-09-16 LAB — TSH: TSH: 1.351 u[IU]/mL (ref 0.350–4.500)

## 2023-09-16 LAB — T4, FREE: Free T4: 1.01 ng/dL (ref 0.61–1.12)

## 2023-09-16 MED ORDER — SODIUM CHLORIDE 0.9 % IV SOLN: Freq: Once | INTRAVENOUS | Status: AC

## 2023-09-16 MED ORDER — HEPARIN SOD (PORK) LOCK FLUSH 100 UNIT/ML IV SOLN
500.0000 [IU] | Freq: Once | INTRAVENOUS | Status: AC | PRN
  Administered 2023-09-16: 500 [IU]

## 2023-09-16 MED ORDER — SODIUM CHLORIDE 0.9% FLUSH
10.0000 mL | INTRAVENOUS | Status: DC | PRN
  Administered 2023-09-16: 10 mL

## 2023-09-16 MED ORDER — SODIUM CHLORIDE 0.9 % IV SOLN
200.0000 mg | Freq: Once | INTRAVENOUS | Status: AC
  Administered 2023-09-16: 200 mg via INTRAVENOUS
  Filled 2023-09-16: qty 200

## 2023-09-16 MED ORDER — SODIUM CHLORIDE 0.9% FLUSH
10.0000 mL | Freq: Once | INTRAVENOUS | Status: AC
  Administered 2023-09-16: 10 mL

## 2023-09-16 NOTE — Progress Notes (Unsigned)
[facility] Cancer Follow up:    Suzanne Croissant, MD [address] [place] [address]   DIAGNOSIS: Cancer Staging  Malignant neoplasm of lower-inner quadrant of right breast of female, estrogen receptor negative (HCC) Staging form: Breast, AJCC 8th Edition - Clinical: Stage IIIC (cT2, cN3c, cM0, G3, ER-, PR-, HER2-) - Unsigned Histologic grading system: 3 grade system   SUMMARY OF ONCOLOGIC HISTORY: Oncology History  Malignant neoplasm of lower-inner quadrant of right breast of female, estrogen receptor negative (HCC)  01/10/2023 Initial Diagnosis   Palpable lump right axilla x 2 weeks bulky axillary lymphadenopathy ultrasound breast: 2 masses at 4 o'clock position 1.1 cm and 0.6 cm multiple axillary lymph nodes largest 4.5 cm also level 2 and 3 (subpectoral lymph node as well): Biopsy: Grade 3 IDC ER 0%, PR 0%, Ki-67 95%, HER2 1+   01/26/2023 Genetic Testing   Negative [employer] Custom Panel (37 genes).  Report date is 01/26/2023.    The [employer] Custom Cancers + RNA Panel includes sequencing, deletion/duplication, and RNA analysis of the following 37 genes: APC, ATM, AXIN2, BARD1, BMPR1A, BRCA1, BRCA2, BRIP1, CDH1, CDK4*, CDKN2A (p14ARF)*, CDKN2A (p16INK4a), CHEK2, CTNNA1, DICER1, EPCAM*, FH, GREM1*, HOXB13*, MBD4*, MLH1, MSH2, MSH3, MSH6, MUTYH, NF1, NTHL1, PALB2, PMS2, POLD1, POLE, PTEN, RAD51C, RAD51D, SMAD4, SMARCA4, STK11, TP53.  *Genes without RNA analysis.    01/28/2023 - 06/27/2023 Chemotherapy   Patient is on Treatment Plan : BREAST Pembrolizumab (200) D1 + Carboplatin (1.5) D1,8,15 + Paclitaxel (80) D1,8,15 q21d X 4 cycles / Pembrolizumab (200) D1 + AC D1 q21d x 4 cycles     07/15/2023 -  Chemotherapy   Patient is on Treatment Plan : BREAST Pembrolizumab (200) q21d x 27 weeks       CURRENT THERAPY:  INTERVAL HISTORY:  Discussed the use of AI scribe software for clinical note transcription with the patient, who gave verbal consent to  proceed.   Suzanne Tucker 42 y.o. 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   Antineoplastic chemotherapy induced pancytopenia (HCC) 05/02/2023   Neutropenic fever (HCC) 04/30/2023   Neutropenia (HCC) 04/29/2023   Genetic testing 02/08/2023   Port-A-Cath in place 01/28/2023   Malignant neoplasm of lower-inner quadrant of right breast of female, estrogen receptor negative (HCC) 01/17/2023   Axillary mass, right 12/28/2022   Palpitations 11/23/2022   Dizzy spells 03/04/2022   Carpal tunnel syndrome on right 11/02/2017   MVP (mitral valve prolapse) 10/28/2017    is allergic to morphine; tape; tessalon [benzonatate]; hydrocodone; latex; antihistamines, loratadine-type; codeine; kiwi extract; oxycontin [oxycodone]; and tramadol.  MEDICAL HISTORY: Past Medical History:  Diagnosis Date   Anginal pain (HCC) 02/2019   related to MVP   Back pain    Breast cancer (HCC)    Depression    Dysrhythmia    MVP   Mitral prolapse    Sleep apnea    mild no cpap indicated    SURGICAL HISTORY: Past Surgical History:  Procedure Laterality Date   BREAST SURGERY Right 2024   Breast biopsy x 2   CESAREAN SECTION  03/30/2008   MASTECTOMY WITH AXILLARY LYMPH NODE DISSECTION Bilateral 07/27/2023   Procedure: BILATERAL MASTECTOMY WITH RIGHT AXILLARY LYMPH NODE DISSECTION;  Surgeon: [name], MD;  Location: [facility];  Service: General;  Laterality: Bilateral;  PEC BLOCK   PORTACATH PLACEMENT N/A 01/27/2023   Procedure: INSERTION PORT-A-CATH;  Surgeon: [name], MD;  Location: [facility];  Service: General;  Laterality: N/A;  leaving accessed   ROBOTIC ASSISTED TOTAL HYSTERECTOMY Bilateral 03/15/2019   Procedure: XI  ROBOTIC ASSISTED TOTAL HYSTERECTOMY with Bilateral Salpingectomy;  Surgeon: Silverio Lay, MD;  Location: [facility];  Service: Gynecology;  Laterality: Bilateral;   TUBAL LIGATION  2017    SOCIAL HISTORY: Social History   Socioeconomic History   Marital status: Married    Spouse name: Not on file   Number of children: Not on file   Years of education: Not on file   Highest education level: Master's degree (e.g., MA, MS, MEng, MEd, MSW, MBA)  Occupational History   Not on file  Tobacco Use   Smoking status: Never   Smokeless tobacco: Never  Vaping Use   Vaping status: Never Used  Substance and Sexual Activity   Alcohol use: Not Currently    Comment:     Drug use: No   Sexual activity: Yes    Birth control/protection: Surgical  Other Topics Concern   Not on file  Social History Narrative   Not on file   Social Drivers of Health   Financial Resource Strain: Low Risk  (12/27/2022)   Overall Financial Resource Strain (CARDIA)    Difficulty of Paying Living Expenses: Not very hard  Food Insecurity: No Food Insecurity (08/24/2023)   Hunger Vital Sign    Worried About Running Out of Food in the Last Year: Never true    Ran Out of Food in the Last Year: Never true  Transportation Needs: No Transportation Needs (08/24/2023)   PRAPARE - Administrator, Civil Service (Medical): No    Lack of Transportation (Non-Medical): No  Physical Activity: Insufficiently Active (12/27/2022)   Exercise Vital Sign    Days of Exercise per Week: 4 days    Minutes of Exercise per Session: 20 min  Stress: No Stress Concern Present (12/27/2022)   [employer] of Occupational Health - Occupational Stress Questionnaire    Feeling of Stress : Only a little  Social Connections: Moderately Isolated (12/27/2022)   Social Connection and Isolation Panel [NHANES]    Frequency of Communication with Friends and Family: More than three times a week    Frequency of Social  Gatherings with Friends and Family: Once a week    Attends Religious Services: Never    Database administrator or Organizations: No    Attends Engineer, structural: Not on file    Marital Status: Married  Catering manager Violence: Not At Risk (08/24/2023)   Humiliation, Afraid, Rape, and Kick questionnaire    Fear of Current or Ex-Partner: No    Emotionally Abused: No    Physically Abused: No    Sexually Abused: No    FAMILY HISTORY: Family History  Problem Relation Age of Onset   Stroke Mother    Alzheimer's disease Father    Diabetes Maternal Grandmother    Heart disease Maternal Grandmother    Heart attack Maternal Grandmother    Diabetes Maternal Grandfather    Heart disease Maternal Grandfather    Alzheimer's disease Paternal Grandmother    Dementia Paternal Grandmother    Alzheimer's disease Paternal Grandfather    Dementia Paternal Grandfather    Alcohol abuse Paternal Grandfather     Review of Systems - Oncology    PHYSICAL EXAMINATION  Onc Performance Status - 09/16/23 1207       ECOG Perf Status   ECOG Perf Status Fully active, able to carry on all pre-disease performance without restriction      KPS SCALE   KPS % SCORE Normal, no compliants, no evidence of disease             Vitals:   09/16/23 1201  BP: 124/83  Pulse: 91  Resp: 16  Temp: 97.9 F (36.6 C)  SpO2: 100%    Physical Exam  LABORATORY DATA:  CBC    Component Value Date/Time   WBC 3.3 (L) 09/16/2023 1026   WBC 10.0 08/11/2023 0353   RBC 3.31 (L) 09/16/2023 1026   HGB 9.9 (L) 09/16/2023 1026   HGB 11.6 04/08/2022 1037   HCT 30.0 (L) 09/16/2023 1026   HCT 35.8 04/08/2022 1037   PLT 228 09/16/2023 1026   PLT 244 04/08/2022 1037   MCV 90.6 09/16/2023 1026   MCV 89 04/08/2022 1037   MCH 29.9 09/16/2023 1026   MCHC 33.0 09/16/2023 1026   RDW 13.5 09/16/2023 1026   RDW 12.1 04/08/2022 1037   LYMPHSABS 0.7 09/16/2023 1026   LYMPHSABS 1.3 04/08/2022 1037    MONOABS 0.3 09/16/2023 1026   EOSABS 0.4 09/16/2023 1026   EOSABS 0.1 04/08/2022 1037   BASOSABS 0.0 09/16/2023 1026   BASOSABS 0.0 04/08/2022 1037    CMP     Component Value Date/Time   NA 138 09/16/2023 1026   NA 140 04/08/2022 1037   K 4.0 09/16/2023 1026   CL 103 09/16/2023 1026   CO2 29 09/16/2023 1026   GLUCOSE 90 09/16/2023 1026   BUN 10 09/16/2023 1026   BUN 9 04/08/2022 1037   CREATININE 0.60 09/16/2023 1026   CALCIUM 9.8 09/16/2023 1026   PROT 7.3 09/16/2023 1026   PROT 6.5 04/08/2022 1037   ALBUMIN 4.2 09/16/2023 1026   ALBUMIN 4.3 04/08/2022 1037   AST 14 (L) 09/16/2023 1026   ALT 7 09/16/2023 1026   ALKPHOS 68 09/16/2023 1026   BILITOT 0.4 09/16/2023 1026   GFRNONAA >60 09/16/2023 1026   GFRAA 132 11/04/2020 1208       PENDING LABS:   RADIOGRAPHIC STUDIES:  No results found.   PATHOLOGY:     ASSESSMENT and THERAPY PLAN:   No problem-specific Assessment & Plan notes found for this encounter.   No orders of the defined types were placed in this encounter.   All questions were answered. The patient knows to call the clinic with any problems, questions or concerns. We can certainly see the patient much sooner if necessary. This note was electronically signed. Noreene Filbert, NP 09/16/2023

## 2023-09-16 NOTE — Patient Instructions (Signed)
[facility] - A DEPT OF [facility]   Discharge Instructions: Thank you for choosing [facility] to provide your oncology and hematology care.   If you have a lab appointment with the Cancer Center, please go directly to the Cancer Center and check in at the registration area.   Wear comfortable clothing and clothing appropriate for easy access to any Portacath or PICC line.   We strive to give you quality time with your provider. You may need to reschedule your appointment if you arrive late (15 or more minutes).  Arriving late affects you and other patients whose appointments are after yours.  Also, if you miss three or more appointments without notifying the office, you may be dismissed from the clinic at the provider's discretion.      For prescription refill requests, have your pharmacy contact our office and allow 72 hours for refills to be completed.    Today you received the following chemotherapy and/or immunotherapy agents: Pembrolizumab Suzanne Tucker)      To help prevent nausea and vomiting after your treatment, we encourage you to take your nausea medication as directed.  BELOW ARE SYMPTOMS THAT SHOULD BE REPORTED IMMEDIATELY: *FEVER GREATER THAN 100.4 F (38 C) OR HIGHER *CHILLS OR SWEATING *NAUSEA AND VOMITING THAT IS NOT CONTROLLED WITH YOUR NAUSEA MEDICATION *UNUSUAL SHORTNESS OF BREATH *UNUSUAL BRUISING OR BLEEDING *URINARY PROBLEMS (pain or burning when urinating, or frequent urination) *BOWEL PROBLEMS (unusual diarrhea, constipation, pain near the anus) TENDERNESS IN MOUTH AND THROAT WITH OR WITHOUT PRESENCE OF ULCERS (sore throat, sores in mouth, or a toothache) UNUSUAL RASH, SWELLING OR PAIN  UNUSUAL VAGINAL DISCHARGE OR ITCHING   Items with * indicate a potential emergency and should be followed up as soon as possible or go to the Emergency Department if any problems should occur.  Please show the CHEMOTHERAPY ALERT CARD  or IMMUNOTHERAPY ALERT CARD at check-in to the Emergency Department and triage nurse.  Should you have questions after your visit or need to cancel or reschedule your appointment, please contact [facility] - A DEPT OF [facility]  Dept: [phone]  and follow the prompts.  Office hours are 8:00 a.m. to 4:30 p.m. Monday - Friday. Please note that voicemails left after 4:00 p.m. may not be returned until the following business day.  We are closed weekends and major holidays. You have access to a nurse at all times for urgent questions. Please call the main number to the clinic Dept: [phone] and follow the prompts.   For any non-urgent questions, you may also contact your provider using MyChart. We now offer e-Visits for anyone 39 and older to request care online for non-urgent symptoms. For details visit [email or web address].   Also download the MyChart app! Go to the app store, search "MyChart", open the app, select [facility], and log in with your MyChart username and password.

## 2023-09-19 ENCOUNTER — Encounter: Payer: Self-pay | Admitting: Hematology and Oncology

## 2023-09-19 ENCOUNTER — Encounter: Payer: Self-pay | Admitting: Rehabilitative and Restorative Service Providers"

## 2023-09-19 ENCOUNTER — Ambulatory Visit
Admission: RE | Admit: 2023-09-19 | Discharge: 2023-09-19 | Disposition: A | Discharge status: Home / Self Care | Payer: Managed Care, Other (non HMO) | Source: Ambulatory Visit | Attending: Radiation Oncology

## 2023-09-19 ENCOUNTER — Ambulatory Visit: Payer: Managed Care, Other (non HMO)

## 2023-09-19 ENCOUNTER — Ambulatory Visit: Payer: Managed Care, Other (non HMO) | Admitting: Rehabilitative and Restorative Service Providers"

## 2023-09-19 ENCOUNTER — Other Ambulatory Visit: Payer: Self-pay

## 2023-09-19 DIAGNOSIS — M25611 Stiffness of right shoulder, not elsewhere classified: Secondary | ICD-10-CM

## 2023-09-19 DIAGNOSIS — Z483 Aftercare following surgery for neoplasm: Secondary | ICD-10-CM

## 2023-09-19 DIAGNOSIS — Z171 Estrogen receptor negative status [ER-]: Secondary | ICD-10-CM

## 2023-09-19 DIAGNOSIS — M25612 Stiffness of left shoulder, not elsewhere classified: Secondary | ICD-10-CM

## 2023-09-19 DIAGNOSIS — C50311 Malignant neoplasm of lower-inner quadrant of right female breast: Secondary | ICD-10-CM | POA: Diagnosis not present

## 2023-09-19 DIAGNOSIS — R252 Cramp and spasm: Secondary | ICD-10-CM

## 2023-09-19 DIAGNOSIS — R293 Abnormal posture: Secondary | ICD-10-CM

## 2023-09-19 LAB — RAD ONC ARIA SESSION SUMMARY
Course Elapsed Days: 12
Plan Fractions Treated to Date: 5
Plan Fractions Treated to Date: 9
Plan Prescribed Dose Per Fraction: 1.8 Gy
Plan Prescribed Dose Per Fraction: 1.8 Gy
Plan Total Fractions Prescribed: 14
Plan Total Fractions Prescribed: 28
Plan Total Prescribed Dose: 25.2 Gy
Plan Total Prescribed Dose: 50.4 Gy
Reference Point Dosage Given to Date: 16.2 Gy
Reference Point Dosage Given to Date: 16.2 Gy
Reference Point Session Dosage Given: 1.8 Gy
Reference Point Session Dosage Given: 1.8 Gy
Session Number: 9

## 2023-09-19 NOTE — Therapy (Signed)
OUTPATIENT PHYSICAL THERAPY BREAST CANCER POST OP TREATMENT AND REASSESSMENT NOTE   Patient Name: Suzanne Tucker MRN: [id number] DOB:12/28/1980, 42 y.o., female Today's Date: 09/19/2023  END OF SESSION:  PT End of Session - 09/19/23 1104     Visit Number 9    Date for PT Re-Evaluation 11/11/23    Authorization Type [employer]    PT Start Time 1058    PT Stop Time 1122    PT Time Calculation (min) 24 min    Activity Tolerance Patient tolerated treatment well    Behavior During Therapy [facility] for tasks assessed/performed                Past Medical History:  Diagnosis Date   Anginal pain (HCC) 02/2019   related to MVP   Back pain    Breast cancer (HCC)    Depression    Dysrhythmia    MVP   Mitral prolapse    Sleep apnea    mild no cpap indicated   Past Surgical History:  Procedure Laterality Date   BREAST SURGERY Right 2024   Breast biopsy x 2   CESAREAN SECTION  03/30/2008   MASTECTOMY WITH AXILLARY LYMPH NODE DISSECTION Bilateral 07/27/2023   Procedure: BILATERAL MASTECTOMY WITH RIGHT AXILLARY LYMPH NODE DISSECTION;  Surgeon: [name], MD;  Location: [facility];  Service: General;  Laterality: Bilateral;  PEC BLOCK   PORTACATH PLACEMENT N/A 01/27/2023   Procedure: INSERTION PORT-A-CATH;  Surgeon: [name], MD;  Location: [facility];  Service: General;  Laterality: N/A;  leaving accessed   ROBOTIC ASSISTED TOTAL HYSTERECTOMY Bilateral 03/15/2019   Procedure: XI ROBOTIC ASSISTED TOTAL HYSTERECTOMY with Bilateral Salpingectomy;  Surgeon: Silverio Lay, MD;  Location: [facility];  Service: Gynecology;  Laterality: Bilateral;   TUBAL LIGATION  2017   Patient Active Problem List   Diagnosis Date Noted   Complication, postoperative infection 08/10/2023   Breast cancer of lower-inner quadrant of right female breast (HCC) 07/27/2023   Dysautonomia (HCC) 07/25/2023   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 [name]  REFERRING DIAG: Right breast cancer  THERAPY DIAG:  Malignant neoplasm of lower-inner quadrant of right breast of female, estrogen receptor negative (HCC)  Aftercare following surgery for neoplasm  Abnormal posture  Stiffness of left shoulder, not elsewhere classified  Stiffness of right shoulder, not elsewhere classified  Cramp and spasm  Rationale for Evaluation and Treatment: Rehabilitation  ONSET DATE: 07/27/2023  SUBJECTIVE:  SUBJECTIVE STATEMENT: Patient reports having some stiffness in her upper back/chest region.  From eval: Patient reports she underwent neoadjuvant chemotherapy from 01/28/2023 - 06/27/2023 followed by a bilateral mastectomy and right axillary lymph node dissection (8 of 20 nodes positive) on 07/27/2023. She is triple negative and will undergo radiation on the right side. She is having a PET scan to ensure there are no metastases. She had a breast infection and was hospitalized last week from 08/10/2023 - 08/13/2023. She is on antibiotics until the end of this week.  PERTINENT HISTORY:  Patient was diagnosed on 01/07/2023 with right grade 3 invasive ductal carcinoma breast cancer. 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 She had a bilateral mastectomy and  right axillary lymph node dissection (8 of 20 nodes positive) on 07/27/2023.  PATIENT GOALS:  Reassess how my recovery is going related to arm function, pain, and swelling.  PAIN: 08/26/2023 Are you having pain? Yes: NPRS scale: 1-4/10 Pain location: R shoulder Pain description: aching, stabbing Aggravating factors: nothing Relieving factors: sitting still   PRECAUTIONS: Recent Surgery, right UE Lymphedema risk  RED FLAGS: None   ACTIVITY LEVEL / LEISURE: She has not returned to exercising   OBJECTIVE:  Today's Treatment DATE: 09/19/2023 Overhead shoulder pulleys for flexion and abduction x2 min each Sidelying open books x10 bilat Supine flexion with 2# on dowel rod 2x10 Supine chest press with 2# on dowel rod 2x10 Supine horizontal shoulder abduction with red tband 2x10 Supine shoulder D2 with red tband x10 bilat   09/07/23: In L sidelying to R scapula in to protraction and retraction and then in all directions to improve mobility and decrease pain with pt initially guarded and required v/c to relax In supine: A-P joint mobs grade 2-3 x 5 min to decrease pain In standing: yellow band attached to arm of treadmill: isometric walkouts with yellow theraband in to flexion, extension, ER and IR x 10 reps each with no pain and v/c and t/c for correct posture - issued these as part of an HEP  09/05/23:  Remeasured bilateral shoulder ROM Pulleys x 2 min in direction of flexion and abduction  Ball up wall x 10 reps in to flexion and 10 reps in to bilateral abduction  Supine over 1/2 foam roll: snow angels x 10 reps with increased tightness, alternating shoulder flexion x 10 reps each, bilateral scaption x 10 reps  Educated about lymphedema risk reduction practices  Importance of posture to decrease back pain   PATIENT SURVEYS:  01/19/2023: QUICK DASH: 20.45 08/15/2023:  Quick DASH 36.36    OBSERVATIONS: Bilateral chest incisions appear to be healing well with steri-strips in  place. There is no visible significant swelling present. No redness or signs of infection present. Hypersensitivity present in in chest and along midline at sternum.  POSTURE:  Forward head and rounded shoulders  LYMPHEDEMA ASSESSMENT:   UPPER EXTREMITY AROM/PROM:   A/PROM RIGHT   eval   RIGHT 08/15/2023 RIGHT 09/05/23  Shoulder extension 53 40 76  Shoulder flexion 157 113 148  Shoulder abduction 159 132 145  Shoulder internal rotation 71 74 75  Shoulder external rotation 86 75 78                          (Blank rows = not tested)   A/PROM LEFT   eval LEFT 08/15/2023 LEFT 09/05/23  Shoulder extension 43 48 78  Shoulder flexion 150 138 158  Shoulder abduction 160 144  149  Shoulder internal rotation 72 70 70  Shoulder external rotation 76 80 77                          (Blank rows = not tested)   CERVICAL AROM: All within normal limits   UPPER EXTREMITY STRENGTH: WFL   LYMPHEDEMA ASSESSMENTS:    LANDMARK RIGHT   eval RIGHT 08/15/2023  10 cm proximal to olecranon process 28.5 27.2  Olecranon process 24.1 23.1  10 cm proximal to ulnar styloid process 23.6 21.7  Just proximal to ulnar styloid process 15.6 15  Across hand at thumb web space 17.7 18  At base of 2nd digit 5.9 5.8  (Blank rows = not tested)   LANDMARK LEFT   eval LEFT 08/15/2023  10 cm proximal to olecranon process 28.6 26.8  Olecranon process 23.8 23.3  10 cm proximal to ulnar styloid process 21.9 21.7  Just proximal to ulnar styloid process 15.4 14.6  Across hand at thumb web space 17.7 17.2  At base of 2nd digit 5.6 5.5  (Blank rows = not tested)  Surgery type/Date: Bilateral mastectomy and right ALND 07/27/2023 Number of lymph nodes removed: 20 Current/past treatment (chemo, radiation, hormone therapy): Neoadjuvant chemotherapy Other symptoms:  Heaviness/tightness Yes Pain Yes Pitting edema No Infections Yes Decreased scar mobility Yes Stemmer sign No  PATIENT EDUCATION:  Education  details: post op HEP (bil AAROM flexion with hands clasped together, scapular retraction, ER with hands behind head, and shoulder abduction crawling up wall) and wall scapular protraction push offs Person educated: Patient Education method: Explanation, Demonstration, Tactile cues, Verbal cues, and Handouts Education comprehension: verbalized understanding and returned demonstration  HOME EXERCISE PROGRAM: Access Code: [email or web address] URL: [email or web address] Date: 09/19/2023 Prepared by: Clydie Braun Tanyla Stege  Exercises - Shoulder Internal Rotation Reactive Isometrics  - 1 x daily - 7 x weekly - 1-3 sets - 10 reps - Shoulder External Rotation Reactive Isometrics (Mirrored)  - 1 x daily - 7 x weekly - 1-3 sets - 10 reps - Shoulder Extension Reactive Isometrics with Elbow Extended  - 1 x daily - 7 x weekly - 1-3 sets - 10 reps - Standing Shoulder Flexion Reactive Isometrics with Elbow Extended  - 1 x daily - 7 x weekly - 1-3 sets - 10 reps - Scapular Retraction with Resistance  - 1 x daily - 7 x weekly - 1-3 sets - 10 reps - Supine Shoulder Flexion Extension AAROM with Dowel  - 1 x daily - 7 x weekly - 2 sets - 10 reps - Sidelying Thoracic Rotation with Open Book  - 1 x daily - 7 x weekly - 1-2 sets - 10 reps - Supine Shoulder Horizontal Abduction with Resistance  - 1 x daily - 7 x weekly - 2 sets - 10 reps - Standing Shoulder Single Arm PNF D2 Flexion with Resistance  - 1 x daily - 7 x weekly - 1 sets - 10 reps  ASSESSMENT:  CLINICAL IMPRESSION: Suzanne Tucker presents to skilled PT reporting that she needs to leave early to return to work.  Patient reports that she is having some tightness in her upper back region.  Patient provided with minimal cuing for pacing and technique on new exercises.  Reassessment measurements for A/ROM were taken on 09/05/2023, and patient has been improving with A/ROM.  Patient continues to increase functional strength and able to utilize red theraband during visit  today.  Patient is compliant with HEP and has  theraband for home use.  Patient provided with updated HEP for home with education to only perform and add as she is able/tolerated.  Patient to be measured for a compression garment next visit, so new lymphedema measurements to be taken at that time.  Patient has note yet met her goals and would benefit from continued PT of 2x/week for 8 weeks to progress towards goal related activities.     Pt will benefit from skilled therapeutic intervention to improve on the following deficits: Decreased knowledge of precautions, impaired UE functional use, pain, decreased ROM, postural dysfunction.   PT treatment/interventions: ADL/Self care home management, [phone]- PT Re-evaluation, 97110-Therapeutic exercises, 97530- Therapeutic activity, 97535- Self Care, [id number]- Manual therapy, [phone]- Aquatic Therapy, Patient/Family education, Dry Needling, Manual lymph drainage, and Scar mobilization   GOALS: Goals reviewed with patient? Yes  LONG TERM GOALS:  (STG=LTG)  GOALS Name Target Date  Goal status  1 Pt will demonstrate she has regained shoulder active flexion and abduction ROM back to baseline bilaterally (>/= 150 flexion and >/= 160 abduction)  11/11/2023 Ongoing  2 Patient will report >/= 50% reduction in skin hypersensitivity for improved tolerance to wearing shirts and bras. 11/11/2023 Ongoing  3 Patient will verbalize good understanding of self scar massage and lymphedema risk reduction practices. 11/11/2023 Ongoing  4 Patient will improve her DASH score to be back to zero (baseline) for improved UE function. 11/11/2023 Ongoing     PLAN:  PT FREQUENCY/DURATION: 2x/week for 8 weeks  PLAN FOR NEXT SESSION: Continue shoulder PROM, Rt scapular mobility, pec stretches, joint mobs, measure for compression sleeve      Reather Laurence, PT, DPT 09/19/23, 11:42 AM  [facility] [address], [address] [place], [place] [address] Phone #  [phone] Fax [phone]

## 2023-09-19 NOTE — Patient Instructions (Signed)
After Breast Cancer Class It is recommended you attend the ABC class to be educated on lymphedema risk reduction. This class is free of charge and lasts for 1 hour. It is a 1-time class. You will need to download the TEAMS app either on your phone or computer. We will send you a link the night before or the morning of the class. You should be able to click on that link to join the class. This is not a confidential class. You don't have to turn your camera on, but other participants may be able to see your email address. You were given the info in writing since you can't attend the class.  Scar massage You can begin gentle scar massage to you incision sites. Gently place one hand on the incision and move the skin (without sliding on the skin) in various directions. Do this for a few minutes and then you can gently massage either coconut oil or vitamin E cream into the scars. YOU'LL BE READY TO BEGIN THIS WHEN YOUR STERI-STRIPS ARE OFF AND THE INCISION IS CLOSED.  Compression garment YOU DON'T NEED TO WEAR A GARMENT SINCE YOU DON'T HAVE SWELLING.  Home exercise Program Continue doing the exercises you were given until you feel like you can do them without feeling any tightness at the end.   Walking Program Studies show that 30 minutes of walking per day (fast enough to elevate your heart rate) can significantly reduce the risk of a cancer recurrence. If you can't walk due to other medical reasons, we encourage you to find another activity you could do (like a stationary bike or water exercise).  Posture After breast cancer surgery, people frequently sit with rounded shoulders posture because it puts their incisions on slack and feels better. If you sit like this and scar tissue forms in that position, you can become very tight and have pain sitting or standing with good posture. Try to be aware of your posture and sit and stand up tall to heal properly.  Follow up PT: It is recommended you return  every 3 months for the first 3 years following surgery to be assessed on the SOZO machine for an L-Dex score. This helps prevent clinically significant lymphedema in 95% of patients. These follow up screens are 10 minute appointments that you are not billed for.

## 2023-09-19 NOTE — Progress Notes (Signed)
Nutrition Follow-up:  Patient with estrogen receptor negative breast cancer of right breast. She is currently receiving keytruda q21d.   Met with patient in infusion. She reports decreased appetite and nausea with episodes of vomiting. Patient reports nausea wakes her up. She is taking over the counter nausea medication without relief. Patient reports zofran makes her sleepy. She does not have a prescription for compazine. Patient eating small amounts of food 3-4 times daily. Recalls 1/2 bagel with cream cheese for breakfast, sliced cheese and bread for snack, [employer] frozen meal for lunch, spaghetti, tofu for dinner.    Medications: reviewed   Labs: reviewed   Anthropometrics: Wt 137 lb 3.2 oz today decreased 7% in 6 weeks - severe for time frame  10/15 - 147 lb   INTERVENTION:  Educated on strategies for nausea, foods best tolerated and foods to limit/avoid Suggested trying zofran at bedtime given overnight nausea Encouraged daily ONS     MONITORING, EVALUATION, GOAL: wt trends, intake   NEXT VISIT: Friday January 3 during infusion

## 2023-09-20 ENCOUNTER — Other Ambulatory Visit: Payer: Self-pay

## 2023-09-20 ENCOUNTER — Encounter: Payer: Self-pay | Admitting: Hematology and Oncology

## 2023-09-20 ENCOUNTER — Ambulatory Visit: Payer: Managed Care, Other (non HMO)

## 2023-09-20 ENCOUNTER — Ambulatory Visit
Admission: RE | Admit: 2023-09-20 | Discharge: 2023-09-20 | Disposition: A | Discharge status: Home / Self Care | Payer: Managed Care, Other (non HMO) | Source: Ambulatory Visit | Attending: Radiation Oncology

## 2023-09-20 DIAGNOSIS — C50311 Malignant neoplasm of lower-inner quadrant of right female breast: Secondary | ICD-10-CM | POA: Diagnosis not present

## 2023-09-20 LAB — RAD ONC ARIA SESSION SUMMARY
Course Elapsed Days: 13
Plan Fractions Treated to Date: 10
Plan Fractions Treated to Date: 5
Plan Prescribed Dose Per Fraction: 1.8 Gy
Plan Prescribed Dose Per Fraction: 1.8 Gy
Plan Total Fractions Prescribed: 14
Plan Total Fractions Prescribed: 28
Plan Total Prescribed Dose: 25.2 Gy
Plan Total Prescribed Dose: 50.4 Gy
Reference Point Dosage Given to Date: 18 Gy
Reference Point Dosage Given to Date: 18 Gy
Reference Point Session Dosage Given: 1.8 Gy
Reference Point Session Dosage Given: 1.8 Gy
Session Number: 10

## 2023-09-20 NOTE — Assessment & Plan Note (Signed)
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%, HER2 1+  01/25/2023: PET/CT: Right breast cancer, right axilla, retropectoral and right cervical lymph nodes   Treatment plan: Neoadjuvant chemotherapy with Taxol Harrell Gave weekly x 12 followed by Adriamycin Cytoxan Keytruda followed by Rande Lawman maintenance 07/27/2023: Bilateral mastectomies: Left mastectomy: Benign, right mastectomy: No evidence of residual cancer, 8/20 lymph nodes positive with extranodal extension in 2 lymph nodes Adjuvant radiation therapy 09/08/2023-10/24/2022 -------------------------------------------------------------------------------------------------------- Treatment plan: Rande Lawman with capecitabine maintenance therapy  Chemotherapy-induced Nausea and Vomiting Severe nausea and vomiting (approximately 6 times in the past week) since starting Capecitabine. Stomach bug may have exacerbated symptoms. Anti nausea medication leading to increased sleepiness. -Continue Capecitabine and monitor for improvement as stomach bug resolves. -Consider reducing Capecitabine dose if nausea and vomiting persist. -Start Famotidine (Pepcid) twice daily to manage potential acid-related stomach pain. -Anti nausea medication PRN  Constipation Reports of constipation for the past couple of weeks. -Increase water intake. -Consider stool softeners or laxatives if constipation persists.  Cancer Treatment Ongoing radiation and Keytruda (Pembrolizumab) therapy. Recent PET scan showed possible microscopic cancer or inflammation. -Continue current treatment regimen. -Follow-up 10/07/2023.  Call our office if symptoms do not improve or worsen.

## 2023-09-21 ENCOUNTER — Ambulatory Visit: Payer: Managed Care, Other (non HMO)

## 2023-09-21 ENCOUNTER — Other Ambulatory Visit: Payer: Self-pay

## 2023-09-21 ENCOUNTER — Encounter: Payer: Self-pay | Admitting: *Deleted

## 2023-09-21 ENCOUNTER — Ambulatory Visit
Admission: RE | Admit: 2023-09-21 | Discharge: 2023-09-21 | Disposition: A | Discharge status: Home / Self Care | Payer: Managed Care, Other (non HMO) | Source: Ambulatory Visit | Attending: Radiation Oncology

## 2023-09-21 DIAGNOSIS — C50311 Malignant neoplasm of lower-inner quadrant of right female breast: Secondary | ICD-10-CM | POA: Diagnosis not present

## 2023-09-21 LAB — RAD ONC ARIA SESSION SUMMARY
Course Elapsed Days: 14
Plan Fractions Treated to Date: 11
Plan Fractions Treated to Date: 6
Plan Prescribed Dose Per Fraction: 1.8 Gy
Plan Prescribed Dose Per Fraction: 1.8 Gy
Plan Total Fractions Prescribed: 14
Plan Total Fractions Prescribed: 28
Plan Total Prescribed Dose: 25.2 Gy
Plan Total Prescribed Dose: 50.4 Gy
Reference Point Dosage Given to Date: 19.8 Gy
Reference Point Dosage Given to Date: 19.8 Gy
Reference Point Session Dosage Given: 1.8 Gy
Reference Point Session Dosage Given: 1.8 Gy
Session Number: 11

## 2023-09-22 ENCOUNTER — Other Ambulatory Visit: Payer: Self-pay

## 2023-09-22 ENCOUNTER — Ambulatory Visit: Payer: Managed Care, Other (non HMO)

## 2023-09-22 ENCOUNTER — Ambulatory Visit
Admission: RE | Admit: 2023-09-22 | Discharge: 2023-09-22 | Disposition: A | Discharge status: Home / Self Care | Payer: Managed Care, Other (non HMO) | Source: Ambulatory Visit | Attending: Radiation Oncology | Admitting: Radiation Oncology

## 2023-09-22 DIAGNOSIS — C50311 Malignant neoplasm of lower-inner quadrant of right female breast: Secondary | ICD-10-CM | POA: Diagnosis not present

## 2023-09-22 LAB — RAD ONC ARIA SESSION SUMMARY
Course Elapsed Days: 15
Plan Fractions Treated to Date: 12
Plan Fractions Treated to Date: 6
Plan Prescribed Dose Per Fraction: 1.8 Gy
Plan Prescribed Dose Per Fraction: 1.8 Gy
Plan Total Fractions Prescribed: 14
Plan Total Fractions Prescribed: 28
Plan Total Prescribed Dose: 25.2 Gy
Plan Total Prescribed Dose: 50.4 Gy
Reference Point Dosage Given to Date: 21.6 Gy
Reference Point Dosage Given to Date: 21.6 Gy
Reference Point Session Dosage Given: 1.8 Gy
Reference Point Session Dosage Given: 1.8 Gy
Session Number: 12

## 2023-09-23 ENCOUNTER — Ambulatory Visit
Admission: RE | Admit: 2023-09-23 | Discharge: 2023-09-23 | Disposition: A | Discharge status: Home / Self Care | Payer: Managed Care, Other (non HMO) | Source: Ambulatory Visit | Attending: Radiation Oncology | Admitting: Radiation Oncology

## 2023-09-23 ENCOUNTER — Ambulatory Visit: Payer: Managed Care, Other (non HMO)

## 2023-09-23 ENCOUNTER — Other Ambulatory Visit: Payer: Self-pay

## 2023-09-23 DIAGNOSIS — C50311 Malignant neoplasm of lower-inner quadrant of right female breast: Secondary | ICD-10-CM | POA: Diagnosis not present

## 2023-09-23 LAB — RAD ONC ARIA SESSION SUMMARY
Course Elapsed Days: 16
Plan Fractions Treated to Date: 13
Plan Fractions Treated to Date: 7
Plan Prescribed Dose Per Fraction: 1.8 Gy
Plan Prescribed Dose Per Fraction: 1.8 Gy
Plan Total Fractions Prescribed: 14
Plan Total Fractions Prescribed: 28
Plan Total Prescribed Dose: 25.2 Gy
Plan Total Prescribed Dose: 50.4 Gy
Reference Point Dosage Given to Date: 23.4 Gy
Reference Point Dosage Given to Date: 23.4 Gy
Reference Point Session Dosage Given: 1.8 Gy
Reference Point Session Dosage Given: 1.8 Gy
Session Number: 13

## 2023-09-26 ENCOUNTER — Other Ambulatory Visit: Payer: Self-pay | Admitting: Hematology and Oncology

## 2023-09-26 ENCOUNTER — Ambulatory Visit
Admission: RE | Admit: 2023-09-26 | Discharge: 2023-09-26 | Disposition: A | Discharge status: Home / Self Care | Payer: Managed Care, Other (non HMO) | Source: Ambulatory Visit | Attending: Radiation Oncology

## 2023-09-26 ENCOUNTER — Ambulatory Visit: Payer: Managed Care, Other (non HMO)

## 2023-09-26 ENCOUNTER — Ambulatory Visit
Admission: RE | Admit: 2023-09-26 | Discharge: 2023-09-26 | Discharge status: Home / Self Care | Payer: Managed Care, Other (non HMO) | Source: Ambulatory Visit | Attending: Radiation Oncology

## 2023-09-26 ENCOUNTER — Other Ambulatory Visit: Payer: Self-pay

## 2023-09-26 DIAGNOSIS — C50311 Malignant neoplasm of lower-inner quadrant of right female breast: Secondary | ICD-10-CM | POA: Diagnosis not present

## 2023-09-26 LAB — RAD ONC ARIA SESSION SUMMARY
Course Elapsed Days: 19
Plan Fractions Treated to Date: 14
Plan Fractions Treated to Date: 7
Plan Prescribed Dose Per Fraction: 1.8 Gy
Plan Prescribed Dose Per Fraction: 1.8 Gy
Plan Total Fractions Prescribed: 14
Plan Total Fractions Prescribed: 28
Plan Total Prescribed Dose: 25.2 Gy
Plan Total Prescribed Dose: 50.4 Gy
Reference Point Dosage Given to Date: 25.2 Gy
Reference Point Dosage Given to Date: 25.2 Gy
Reference Point Session Dosage Given: 1.8 Gy
Reference Point Session Dosage Given: 1.8 Gy
Session Number: 14

## 2023-09-27 ENCOUNTER — Ambulatory Visit: Payer: Managed Care, Other (non HMO)

## 2023-09-27 ENCOUNTER — Ambulatory Visit
Admission: RE | Admit: 2023-09-27 | Discharge: 2023-09-27 | Disposition: A | Discharge status: Home / Self Care | Payer: Managed Care, Other (non HMO) | Source: Ambulatory Visit | Attending: Radiation Oncology

## 2023-09-27 ENCOUNTER — Other Ambulatory Visit: Payer: Self-pay

## 2023-09-27 ENCOUNTER — Ambulatory Visit: Payer: Managed Care, Other (non HMO) | Admitting: Rehabilitation

## 2023-09-27 ENCOUNTER — Encounter: Payer: Self-pay | Admitting: Rehabilitation

## 2023-09-27 DIAGNOSIS — R293 Abnormal posture: Secondary | ICD-10-CM

## 2023-09-27 DIAGNOSIS — M25612 Stiffness of left shoulder, not elsewhere classified: Secondary | ICD-10-CM

## 2023-09-27 DIAGNOSIS — C50311 Malignant neoplasm of lower-inner quadrant of right female breast: Secondary | ICD-10-CM | POA: Diagnosis not present

## 2023-09-27 DIAGNOSIS — M25611 Stiffness of right shoulder, not elsewhere classified: Secondary | ICD-10-CM

## 2023-09-27 DIAGNOSIS — Z483 Aftercare following surgery for neoplasm: Secondary | ICD-10-CM

## 2023-09-27 DIAGNOSIS — R252 Cramp and spasm: Secondary | ICD-10-CM

## 2023-09-27 DIAGNOSIS — Z171 Estrogen receptor negative status [ER-]: Secondary | ICD-10-CM

## 2023-09-27 LAB — RAD ONC ARIA SESSION SUMMARY
Course Elapsed Days: 20
Plan Fractions Treated to Date: 15
Plan Fractions Treated to Date: 8
Plan Prescribed Dose Per Fraction: 1.8 Gy
Plan Prescribed Dose Per Fraction: 1.8 Gy
Plan Total Fractions Prescribed: 14
Plan Total Fractions Prescribed: 28
Plan Total Prescribed Dose: 25.2 Gy
Plan Total Prescribed Dose: 50.4 Gy
Reference Point Dosage Given to Date: 27 Gy
Reference Point Dosage Given to Date: 27 Gy
Reference Point Session Dosage Given: 1.8 Gy
Reference Point Session Dosage Given: 1.8 Gy
Session Number: 15

## 2023-09-27 NOTE — Therapy (Signed)
OUTPATIENT PHYSICAL THERAPY BREAST CANCER POST OP TREATMENT    Patient Name: Suzanne Tucker MRN: [id number] DOB:29-Jul-1981, 42 y.o., female Today's Date: 09/27/2023  END OF SESSION:  PT End of Session - 09/27/23 1038     Visit Number 10    Number of Visits 25    Date for PT Re-Evaluation 11/11/23    PT Start Time 1000    PT Stop Time 1030   has to leave for radiation   PT Time Calculation (min) 30 min    Activity Tolerance Patient tolerated treatment well    Behavior During Therapy [facility] for tasks assessed/performed                 Past Medical History:  Diagnosis Date   Anginal pain (HCC) 02/2019   related to MVP   Back pain    Breast cancer (HCC)    Depression    Dysrhythmia    MVP   Mitral prolapse    Sleep apnea    mild no cpap indicated   Past Surgical History:  Procedure Laterality Date   BREAST SURGERY Right 2024   Breast biopsy x 2   CESAREAN SECTION  03/30/2008   MASTECTOMY WITH AXILLARY LYMPH NODE DISSECTION Bilateral 07/27/2023   Procedure: BILATERAL MASTECTOMY WITH RIGHT AXILLARY LYMPH NODE DISSECTION;  Surgeon: [name], MD;  Location: [facility];  Service: General;  Laterality: Bilateral;  PEC BLOCK   PORTACATH PLACEMENT N/A 01/27/2023   Procedure: INSERTION PORT-A-CATH;  Surgeon: [name], MD;  Location: [facility];  Service: General;  Laterality: N/A;  leaving accessed   ROBOTIC ASSISTED TOTAL HYSTERECTOMY Bilateral 03/15/2019   Procedure: XI ROBOTIC ASSISTED TOTAL HYSTERECTOMY with Bilateral Salpingectomy;  Surgeon: Silverio Lay, MD;  Location: [facility];  Service: Gynecology;  Laterality: Bilateral;   TUBAL LIGATION  2017   Patient Active Problem List   Diagnosis Date Noted   Complication, postoperative infection 08/10/2023   Breast cancer of lower-inner quadrant of right female breast (HCC) 07/27/2023   Dysautonomia (HCC) 07/25/2023   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 [name]  REFERRING DIAG: Right breast cancer  THERAPY DIAG:  Malignant neoplasm of lower-inner quadrant of right breast of female, estrogen receptor negative (HCC)  Aftercare following surgery for neoplasm  Abnormal posture  Stiffness of left shoulder, not elsewhere classified  Stiffness of right shoulder, not elsewhere classified  Cramp and spasm  Rationale for Evaluation and Treatment: Rehabilitation  ONSET DATE: 07/27/2023  SUBJECTIVE:  SUBJECTIVE STATEMENT: I am maybe starting to get red.    From eval: Patient reports she underwent neoadjuvant chemotherapy from 01/28/2023 - 06/27/2023 followed by a bilateral mastectomy and right axillary lymph node dissection (8 of 20 nodes positive) on 07/27/2023. She is triple negative and will undergo radiation on the right side. She is having a PET scan to ensure there are no metastases. She had a breast infection and was hospitalized last week from 08/10/2023 - 08/13/2023. She is on antibiotics until the end of this week.  PERTINENT HISTORY:  Patient was diagnosed on 01/07/2023 with right grade 3 invasive ductal carcinoma breast cancer. 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 She had a bilateral mastectomy and right axillary lymph node dissection (8 of 20 nodes positive) on 07/27/2023.  PATIENT GOALS:  Reassess  how my recovery is going related to arm function, pain, and swelling.  PAIN: 08/26/2023 Are you having pain? Yes: NPRS scale: 1-4/10 Pain location: R shoulder Pain description: aching, stabbing Aggravating factors: nothing Relieving factors: sitting still   PRECAUTIONS: Recent Surgery, right UE Lymphedema risk  RED FLAGS: None   ACTIVITY LEVEL / LEISURE: She has not returned to exercising   OBJECTIVE:  Today's Treatment DATE: 09/27/23 Measured for compression sleeve today.  Size 2 max regular: Floral Purple Henna. Will fax order due to special color request.   Overhead shoulder pulleys for flexion and abduction x2 min each Supine snow angel pectoralis stretch 2x60"  Active snow angels x 5  Supine flexion with 2# on dowel rod x10  09/19/2023 Overhead shoulder pulleys for flexion and abduction x2 min each Sidelying open books x10 bilat Supine flexion with 2# on dowel rod 2x10 Supine chest press with 2# on dowel rod 2x10 Supine horizontal shoulder abduction with red tband 2x10 Supine shoulder D2 with red tband x10 bilat  09/07/23: In L sidelying to R scapula in to protraction and retraction and then in all directions to improve mobility and decrease pain with pt initially guarded and required v/c to relax In supine: A-P joint mobs grade 2-3 x 5 min to decrease pain In standing: yellow band attached to arm of treadmill: isometric walkouts with yellow theraband in to flexion, extension, ER and IR x 10 reps each with no pain and v/c and t/c for correct posture - issued these as part of an HEP  09/05/23: Remeasured bilateral shoulder ROM Pulleys x 2 min in direction of flexion and abduction Ball up wall x 10 reps in to flexion and 10 reps in to bilateral abduction Supine over 1/2 foam roll: snow angels x 10 reps with increased tightness, alternating shoulder flexion x 10 reps each, bilateral scaption x 10 reps Educated about lymphedema risk reduction practices Importance of  posture to decrease back pain   PATIENT SURVEYS:  01/19/2023: QUICK DASH: 20.45 08/15/2023:  Quick DASH 36.36  OBSERVATIONS: Bilateral chest incisions appear to be healing well with steri-strips in place. There is no visible significant swelling present. No redness or signs of infection present. Hypersensitivity present in in chest and along midline at sternum.  POSTURE:  Forward head and rounded shoulders  LYMPHEDEMA ASSESSMENT:   UPPER EXTREMITY AROM/PROM:   A/PROM RIGHT   eval   RIGHT 08/15/2023 RIGHT 09/05/23  Shoulder extension 53 40 76  Shoulder flexion 157 113 148  Shoulder abduction 159 132 145  Shoulder internal rotation 71 74 75  Shoulder external rotation 86 75 78                          (  Blank rows = not tested)   A/PROM LEFT   eval LEFT 08/15/2023 LEFT 09/05/23  Shoulder extension 43 48 78  Shoulder flexion 150 138 158  Shoulder abduction 160 144 149  Shoulder internal rotation 72 70 70  Shoulder external rotation 76 80 77                          (Blank rows = not tested)   CERVICAL AROM: All within normal limits   UPPER EXTREMITY STRENGTH: WFL   LYMPHEDEMA ASSESSMENTS:    LANDMARK RIGHT   eval RIGHT 08/15/2023  10 cm proximal to olecranon process 28.5 27.2  Olecranon process 24.1 23.1  10 cm proximal to ulnar styloid process 23.6 21.7  Just proximal to ulnar styloid process 15.6 15  Across hand at thumb web space 17.7 18  At base of 2nd digit 5.9 5.8  (Blank rows = not tested)   LANDMARK LEFT   eval LEFT 08/15/2023  10 cm proximal to olecranon process 28.6 26.8  Olecranon process 23.8 23.3  10 cm proximal to ulnar styloid process 21.9 21.7  Just proximal to ulnar styloid process 15.4 14.6  Across hand at thumb web space 17.7 17.2  At base of 2nd digit 5.6 5.5  (Blank rows = not tested)  Surgery type/Date: Bilateral mastectomy and right ALND 07/27/2023 Number of lymph nodes removed: 20 Current/past treatment (chemo, radiation, hormone  therapy): Neoadjuvant chemotherapy Other symptoms:  Heaviness/tightness Yes Pain Yes Pitting edema No Infections Yes Decreased scar mobility Yes Stemmer sign No  PATIENT EDUCATION:  Education details: post op HEP (bil AAROM flexion with hands clasped together, scapular retraction, ER with hands behind head, and shoulder abduction crawling up wall) and wall scapular protraction push offs Person educated: Patient Education method: Explanation, Demonstration, Tactile cues, Verbal cues, and Handouts Education comprehension: verbalized understanding and returned demonstration  HOME EXERCISE PROGRAM: Access Code: [email or web address] URL: [email or web address] Date: 09/19/2023 Prepared by: Clydie Braun Menke  Exercises - Shoulder Internal Rotation Reactive Isometrics  - 1 x daily - 7 x weekly - 1-3 sets - 10 reps - Shoulder External Rotation Reactive Isometrics (Mirrored)  - 1 x daily - 7 x weekly - 1-3 sets - 10 reps - Shoulder Extension Reactive Isometrics with Elbow Extended  - 1 x daily - 7 x weekly - 1-3 sets - 10 reps - Standing Shoulder Flexion Reactive Isometrics with Elbow Extended  - 1 x daily - 7 x weekly - 1-3 sets - 10 reps - Scapular Retraction with Resistance  - 1 x daily - 7 x weekly - 1-3 sets - 10 reps - Supine Shoulder Flexion Extension AAROM with Dowel  - 1 x daily - 7 x weekly - 2 sets - 10 reps - Sidelying Thoracic Rotation with Open Book  - 1 x daily - 7 x weekly - 1-2 sets - 10 reps - Supine Shoulder Horizontal Abduction with Resistance  - 1 x daily - 7 x weekly - 2 sets - 10 reps - Standing Shoulder Single Arm PNF D2 Flexion with Resistance  - 1 x daily - 7 x weekly - 1 sets - 10 reps  ASSESSMENT:  CLINICAL IMPRESSION:  Measured for compression today and then completed exercise to continue mobility and stretching.  Pt is starting to get a bit red from radiation but tolerated everything well today. She will require an off the shelf circular knit compression sleeve  due to no active lymphedema but at high risk  due to the removal of 20 lymph nodes in the Rt axilla.  She is very active and will need it for hiking and possibly work.    Pt will benefit from skilled therapeutic intervention to improve on the following deficits: Decreased knowledge of precautions, impaired UE functional use, pain, decreased ROM, postural dysfunction.   PT treatment/interventions: ADL/Self care home management, [phone]- PT Re-evaluation, 97110-Therapeutic exercises, 97530- Therapeutic activity, 97535- Self Care, 09811- Manual therapy, [phone]- Aquatic Therapy, Patient/Family education, Dry Needling, Manual lymph drainage, and Scar mobilization   GOALS: Goals reviewed with patient? Yes  LONG TERM GOALS:  (STG=LTG)  GOALS Name Target Date  Goal status  1 Pt will demonstrate she has regained shoulder active flexion and abduction ROM back to baseline bilaterally (>/= 150 flexion and >/= 160 abduction)  11/11/2023 Ongoing  2 Patient will report >/= 50% reduction in skin hypersensitivity for improved tolerance to wearing shirts and bras. 11/11/2023 Ongoing  3 Patient will verbalize good understanding of self scar massage and lymphedema risk reduction practices. 11/11/2023 Ongoing  4 Patient will improve her DASH score to be back to zero (baseline) for improved UE function. 11/11/2023 Ongoing     PLAN:  PT FREQUENCY/DURATION: 2x/week for 8 weeks  PLAN FOR NEXT SESSION: Continue shoulder PROM, Rt scapular mobility, pec stretches, joint mobs,   Garments:  Faxed order to [facility] 09/29/23  Gwenevere Abbot, PT  09/27/23, 10:40 AM  [facility] [address] [place], [place] [address] Phone # [phone] Fax [phone]

## 2023-09-29 ENCOUNTER — Telehealth: Payer: Self-pay | Admitting: *Deleted

## 2023-09-29 ENCOUNTER — Encounter: Payer: Self-pay | Admitting: Adult Health

## 2023-09-29 ENCOUNTER — Ambulatory Visit
Admission: RE | Admit: 2023-09-29 | Discharge: 2023-09-29 | Disposition: A | Discharge status: Home / Self Care | Payer: Managed Care, Other (non HMO) | Source: Ambulatory Visit | Attending: Radiation Oncology | Admitting: Radiation Oncology

## 2023-09-29 ENCOUNTER — Other Ambulatory Visit: Payer: Self-pay

## 2023-09-29 ENCOUNTER — Ambulatory Visit: Payer: Managed Care, Other (non HMO) | Admitting: Rehabilitation

## 2023-09-29 ENCOUNTER — Ambulatory Visit: Payer: Managed Care, Other (non HMO)

## 2023-09-29 DIAGNOSIS — C50311 Malignant neoplasm of lower-inner quadrant of right female breast: Secondary | ICD-10-CM | POA: Diagnosis not present

## 2023-09-29 LAB — RAD ONC ARIA SESSION SUMMARY
Course Elapsed Days: 22
Plan Fractions Treated to Date: 16
Plan Fractions Treated to Date: 8
Plan Prescribed Dose Per Fraction: 1.8 Gy
Plan Prescribed Dose Per Fraction: 1.8 Gy
Plan Total Fractions Prescribed: 14
Plan Total Fractions Prescribed: 28
Plan Total Prescribed Dose: 25.2 Gy
Plan Total Prescribed Dose: 50.4 Gy
Reference Point Dosage Given to Date: 28.8 Gy
Reference Point Dosage Given to Date: 28.8 Gy
Reference Point Session Dosage Given: 1.8 Gy
Reference Point Session Dosage Given: 1.8 Gy
Session Number: 16

## 2023-09-29 MED ORDER — METHYLPREDNISOLONE 4 MG PO TBPK: ORAL_TABLET | ORAL | 0 refills | Status: DC

## 2023-09-29 NOTE — Telephone Encounter (Signed)
Per My Chart message - pt states recurrent vertigo.  She has history of above with last episode in August this year- evaluated by [facility] with noted fluid in left ear. Treated with steroid taper pak with resolution.  Pt reports upper respiratory symptoms as well as her daughter is sick.  Pt had MRI of brain in April this year.  Per MD review - recommended repeat steroids.  Informed pt and verified pharmacy,

## 2023-09-30 ENCOUNTER — Other Ambulatory Visit: Payer: Self-pay | Admitting: Radiation Therapy

## 2023-09-30 ENCOUNTER — Ambulatory Visit
Admission: RE | Admit: 2023-09-30 | Discharge: 2023-09-30 | Disposition: A | Discharge status: Home / Self Care | Payer: Managed Care, Other (non HMO) | Source: Ambulatory Visit | Attending: Radiation Oncology | Admitting: Radiation Oncology

## 2023-09-30 ENCOUNTER — Telehealth: Payer: Self-pay | Admitting: Radiation Therapy

## 2023-09-30 ENCOUNTER — Other Ambulatory Visit: Payer: Self-pay

## 2023-09-30 ENCOUNTER — Ambulatory Visit
Admission: RE | Admit: 2023-09-30 | Discharge: 2023-09-30 | Disposition: A | Discharge status: Home / Self Care | Payer: Managed Care, Other (non HMO) | Source: Ambulatory Visit | Attending: Radiation Oncology

## 2023-09-30 ENCOUNTER — Ambulatory Visit: Payer: Managed Care, Other (non HMO)

## 2023-09-30 DIAGNOSIS — C50311 Malignant neoplasm of lower-inner quadrant of right female breast: Secondary | ICD-10-CM | POA: Diagnosis not present

## 2023-09-30 LAB — RAD ONC ARIA SESSION SUMMARY
Course Elapsed Days: 23
Plan Fractions Treated to Date: 17
Plan Fractions Treated to Date: 9
Plan Prescribed Dose Per Fraction: 1.8 Gy
Plan Prescribed Dose Per Fraction: 1.8 Gy
Plan Total Fractions Prescribed: 14
Plan Total Fractions Prescribed: 28
Plan Total Prescribed Dose: 25.2 Gy
Plan Total Prescribed Dose: 50.4 Gy
Reference Point Dosage Given to Date: 30.6 Gy
Reference Point Dosage Given to Date: 30.6 Gy
Reference Point Session Dosage Given: 1.8 Gy
Reference Point Session Dosage Given: 1.8 Gy
Session Number: 17

## 2023-09-30 NOTE — Telephone Encounter (Signed)
I spoke with [name] about the upcoming brain MRI scheduled for her on 1/8. She asked me to also reach out to [facility] rehab to cancel the appointment she as scheduled on 1/8. I left a voicemail on that office line requesting they reach out to the patient and reschedule her PT visit.   Jalene Mullet R.T.(R)(T) Radiation Special Procedures Navigator

## 2023-10-03 ENCOUNTER — Ambulatory Visit: Payer: Managed Care, Other (non HMO)

## 2023-10-03 ENCOUNTER — Ambulatory Visit
Admission: RE | Admit: 2023-10-03 | Discharge: 2023-10-03 | Disposition: A | Discharge status: Home / Self Care | Payer: Managed Care, Other (non HMO) | Source: Ambulatory Visit | Attending: Radiation Oncology | Admitting: Radiation Oncology

## 2023-10-03 ENCOUNTER — Other Ambulatory Visit: Payer: Self-pay

## 2023-10-03 ENCOUNTER — Ambulatory Visit: Payer: Managed Care, Other (non HMO) | Admitting: Physical Therapy

## 2023-10-03 DIAGNOSIS — C50311 Malignant neoplasm of lower-inner quadrant of right female breast: Secondary | ICD-10-CM | POA: Diagnosis not present

## 2023-10-03 LAB — RAD ONC ARIA SESSION SUMMARY
Course Elapsed Days: 26
Plan Fractions Treated to Date: 18
Plan Fractions Treated to Date: 9
Plan Prescribed Dose Per Fraction: 1.8 Gy
Plan Prescribed Dose Per Fraction: 1.8 Gy
Plan Total Fractions Prescribed: 14
Plan Total Fractions Prescribed: 28
Plan Total Prescribed Dose: 25.2 Gy
Plan Total Prescribed Dose: 50.4 Gy
Reference Point Dosage Given to Date: 32.4 Gy
Reference Point Dosage Given to Date: 32.4 Gy
Reference Point Session Dosage Given: 1.8 Gy
Reference Point Session Dosage Given: 1.8 Gy
Session Number: 18

## 2023-10-04 ENCOUNTER — Other Ambulatory Visit: Payer: Self-pay

## 2023-10-04 ENCOUNTER — Ambulatory Visit: Payer: Managed Care, Other (non HMO)

## 2023-10-04 ENCOUNTER — Ambulatory Visit
Admission: RE | Admit: 2023-10-04 | Discharge: 2023-10-04 | Disposition: A | Discharge status: Home / Self Care | Payer: Managed Care, Other (non HMO) | Source: Ambulatory Visit | Attending: Radiation Oncology

## 2023-10-04 DIAGNOSIS — C50311 Malignant neoplasm of lower-inner quadrant of right female breast: Secondary | ICD-10-CM | POA: Diagnosis not present

## 2023-10-04 LAB — RAD ONC ARIA SESSION SUMMARY
Course Elapsed Days: 27
Plan Fractions Treated to Date: 10
Plan Fractions Treated to Date: 19
Plan Prescribed Dose Per Fraction: 1.8 Gy
Plan Prescribed Dose Per Fraction: 1.8 Gy
Plan Total Fractions Prescribed: 14
Plan Total Fractions Prescribed: 28
Plan Total Prescribed Dose: 25.2 Gy
Plan Total Prescribed Dose: 50.4 Gy
Reference Point Dosage Given to Date: 34.2 Gy
Reference Point Dosage Given to Date: 34.2 Gy
Reference Point Session Dosage Given: 1.8 Gy
Reference Point Session Dosage Given: 1.8 Gy
Session Number: 19

## 2023-10-06 ENCOUNTER — Other Ambulatory Visit: Payer: Self-pay

## 2023-10-06 ENCOUNTER — Ambulatory Visit: Payer: Managed Care, Other (non HMO)

## 2023-10-06 ENCOUNTER — Ambulatory Visit: Payer: Managed Care, Other (non HMO) | Attending: General Surgery

## 2023-10-06 ENCOUNTER — Ambulatory Visit
Admission: RE | Admit: 2023-10-06 | Discharge: 2023-10-06 | Disposition: A | Discharge status: Home / Self Care | Payer: Managed Care, Other (non HMO) | Source: Ambulatory Visit | Attending: Radiation Oncology | Admitting: Radiation Oncology

## 2023-10-06 DIAGNOSIS — Z923 Personal history of irradiation: Secondary | ICD-10-CM | POA: Diagnosis not present

## 2023-10-06 DIAGNOSIS — R252 Cramp and spasm: Secondary | ICD-10-CM | POA: Diagnosis present

## 2023-10-06 DIAGNOSIS — R293 Abnormal posture: Secondary | ICD-10-CM | POA: Diagnosis present

## 2023-10-06 DIAGNOSIS — Z483 Aftercare following surgery for neoplasm: Secondary | ICD-10-CM | POA: Diagnosis present

## 2023-10-06 DIAGNOSIS — M25611 Stiffness of right shoulder, not elsewhere classified: Secondary | ICD-10-CM | POA: Diagnosis present

## 2023-10-06 DIAGNOSIS — M25612 Stiffness of left shoulder, not elsewhere classified: Secondary | ICD-10-CM | POA: Diagnosis present

## 2023-10-06 DIAGNOSIS — C50311 Malignant neoplasm of lower-inner quadrant of right female breast: Secondary | ICD-10-CM | POA: Insufficient documentation

## 2023-10-06 DIAGNOSIS — Z171 Estrogen receptor negative status [ER-]: Secondary | ICD-10-CM | POA: Insufficient documentation

## 2023-10-06 DIAGNOSIS — Z5112 Encounter for antineoplastic immunotherapy: Secondary | ICD-10-CM | POA: Diagnosis present

## 2023-10-06 LAB — RAD ONC ARIA SESSION SUMMARY
Course Elapsed Days: 29
Plan Fractions Treated to Date: 10
Plan Fractions Treated to Date: 20
Plan Prescribed Dose Per Fraction: 1.8 Gy
Plan Prescribed Dose Per Fraction: 1.8 Gy
Plan Total Fractions Prescribed: 14
Plan Total Fractions Prescribed: 28
Plan Total Prescribed Dose: 25.2 Gy
Plan Total Prescribed Dose: 50.4 Gy
Reference Point Dosage Given to Date: 36 Gy
Reference Point Dosage Given to Date: 36 Gy
Reference Point Session Dosage Given: 1.8 Gy
Reference Point Session Dosage Given: 1.8 Gy
Session Number: 20

## 2023-10-06 NOTE — Therapy (Signed)
 OUTPATIENT PHYSICAL THERAPY BREAST CANCER POST OP TREATMENT    Patient Name: Suzanne Tucker MRN: [id number] DOB:08/05/1981, 43 y.o., female Today's Date: 10/06/2023  END OF SESSION:  PT End of Session - 10/06/23 0755     Visit Number 11    Number of Visits 25    Date for PT Re-Evaluation 11/11/23    PT Start Time 0758    PT Stop Time 0854    PT Time Calculation (min) 56 min    Activity Tolerance Patient tolerated treatment well    Behavior During Therapy [facility] for tasks assessed/performed                 Past Medical History:  Diagnosis Date   Anginal pain (HCC) 02/2019   related to MVP   Back pain    Breast cancer (HCC)    Depression    Dysrhythmia    MVP   Mitral prolapse    Sleep apnea    mild no cpap indicated   Past Surgical History:  Procedure Laterality Date   BREAST SURGERY Right 2024   Breast biopsy x 2   CESAREAN SECTION  03/30/2008   MASTECTOMY WITH AXILLARY LYMPH NODE DISSECTION Bilateral 07/27/2023   Procedure: BILATERAL MASTECTOMY WITH RIGHT AXILLARY LYMPH NODE DISSECTION;  Surgeon: Aron Shoulders, MD;  Location: [facility];  Service: General;  Laterality: Bilateral;  PEC BLOCK   PORTACATH PLACEMENT N/A 01/27/2023   Procedure: INSERTION PORT-A-CATH;  Surgeon: Aron Shoulders, MD;  Location: [facility];  Service: General;  Laterality: N/A;  leaving accessed   ROBOTIC ASSISTED TOTAL HYSTERECTOMY Bilateral 03/15/2019   Procedure: XI ROBOTIC ASSISTED TOTAL HYSTERECTOMY with Bilateral Salpingectomy;  Surgeon: Darcel Pool, MD;  Location: [facility];  Service: Gynecology;  Laterality: Bilateral;   TUBAL LIGATION  2017   Patient Active Problem List   Diagnosis Date Noted   Complication, postoperative infection 08/10/2023   Breast cancer of lower-inner quadrant of right female breast (HCC) 07/27/2023   Dysautonomia (HCC) 07/25/2023   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 Shoulders Aron  REFERRING DIAG: Right breast cancer  THERAPY DIAG:  Malignant neoplasm of lower-inner quadrant of right breast of female, estrogen receptor negative (HCC)  Aftercare following surgery for neoplasm  Abnormal posture  Stiffness of left shoulder, not elsewhere classified  Stiffness of right shoulder, not elsewhere classified  Cramp and spasm  Rationale for Evaluation and Treatment: Rehabilitation  ONSET DATE: 07/27/2023  SUBJECTIVE:  SUBJECTIVE STATEMENT: 10/06/2023 Radiation is going well. I am starting to get sensitive skin and some stabbing pains and fatigue also. I feel really tight at my ribs on both side and central Mid back.   From eval: Patient reports she underwent neoadjuvant chemotherapy from 01/28/2023 - 06/27/2023 followed by a bilateral mastectomy and right axillary lymph node dissection (8 of 20 nodes positive) on 07/27/2023. She is triple negative and will undergo radiation on the right side. She is having a PET scan to ensure there are no metastases. She had a breast infection and was hospitalized last week from 08/10/2023 - 08/13/2023. She is on antibiotics until the end of this week.  PERTINENT HISTORY:  Patient was diagnosed on 01/07/2023 with right grade 3 invasive ductal carcinoma breast cancer. 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 She had a bilateral  mastectomy and right axillary lymph node dissection (8 of 20 nodes positive) on 07/27/2023.  PATIENT GOALS:  Reassess how my recovery is going related to arm function, pain, and swelling.  PAIN: 08/26/2023 Are you having pain? Yes: NPRS scale: 1-4/10 Pain location: R shoulder Pain description: aching, stabbing Aggravating factors: nothing Relieving factors: sitting still   PRECAUTIONS: Recent Surgery, right UE Lymphedema risk  RED FLAGS: None   ACTIVITY LEVEL / LEISURE: She has not returned to exercising   OBJECTIVE:  Today's Treatment DATE:  10/06/2023  Overhead pulleys flexion and abduction x 2:30 min ea Ball rolls flexion x 10, abduction x 5 B Wall arc B x 4 ea, x 5sec hold Postural theraband with yellow;scapular retraction, shoulder extension, and bilateral ER x 10 Bilateral AROM shoulder flexion, scaption x 5, horizontal abd x 5 Supine horizontal abd yellow x 10 Snow angels x 5  PROM bilateral shoulders flexion, scaption, abd, ER 09/27/23 Measured for compression sleeve today.  Size 2 max regular: Floral Purple Henna. Will fax order due to special color request.   Overhead shoulder pulleys for flexion and abduction x2 min each Supine snow angel pectoralis stretch 2x60  Active snow angels x 5  Supine flexion with 2# on dowel rod x10  09/19/2023 Overhead shoulder pulleys for flexion and abduction x2 min each Sidelying open books x10 bilat Supine flexion with 2# on dowel rod 2x10 Supine chest press with 2# on dowel rod 2x10 Supine horizontal shoulder abduction with red tband 2x10 Supine shoulder D2 with red tband x10 bilat  09/07/23: In L sidelying to R scapula in to protraction and retraction and then in all directions to improve mobility and decrease pain with pt initially guarded and required v/c to relax In supine: A-P joint mobs grade 2-3 x 5 min to decrease pain In standing: yellow band attached to arm of treadmill: isometric walkouts with yellow theraband in  to flexion, extension, ER and IR x 10 reps each with no pain and v/c and t/c for correct posture - issued these as part of an HEP  09/05/23: Remeasured bilateral shoulder ROM Pulleys x 2 min in direction of flexion and abduction Ball up wall x 10 reps in to flexion and 10 reps in to bilateral abduction Supine over 1/2 foam roll: snow angels x 10 reps with increased tightness, alternating shoulder flexion x 10 reps each, bilateral scaption x 10 reps Educated about lymphedema risk reduction practices Importance of posture to decrease back pain   PATIENT SURVEYS:  01/19/2023: QUICK DASH: 20.45 08/15/2023:  Quick DASH 36.36  OBSERVATIONS: Bilateral chest incisions appear to be healing well with steri-strips in place.  There is no visible significant swelling present. No redness or signs of infection present. Hypersensitivity present in in chest and along midline at sternum.  POSTURE:  Forward head and rounded shoulders  LYMPHEDEMA ASSESSMENT:   UPPER EXTREMITY AROM/PROM:   A/PROM RIGHT   eval   RIGHT 08/15/2023 RIGHT 09/05/23  Shoulder extension 53 40 76  Shoulder flexion 157 113 148  Shoulder abduction 159 132 145  Shoulder internal rotation 71 74 75  Shoulder external rotation 86 75 78                          (Blank rows = not tested)   A/PROM LEFT   eval LEFT 08/15/2023 LEFT 09/05/23  Shoulder extension 43 48 78  Shoulder flexion 150 138 158  Shoulder abduction 160 144 149  Shoulder internal rotation 72 70 70  Shoulder external rotation 76 80 77                          (Blank rows = not tested)   CERVICAL AROM: All within normal limits   UPPER EXTREMITY STRENGTH: WFL   LYMPHEDEMA ASSESSMENTS:    LANDMARK RIGHT   eval RIGHT 08/15/2023  10 cm proximal to olecranon process 28.5 27.2  Olecranon process 24.1 23.1  10 cm proximal to ulnar styloid process 23.6 21.7  Just proximal to ulnar styloid process 15.6 15  Across hand at thumb web space 17.7 18  At base of  2nd digit 5.9 5.8  (Blank rows = not tested)   LANDMARK LEFT   eval LEFT 08/15/2023  10 cm proximal to olecranon process 28.6 26.8  Olecranon process 23.8 23.3  10 cm proximal to ulnar styloid process 21.9 21.7  Just proximal to ulnar styloid process 15.4 14.6  Across hand at thumb web space 17.7 17.2  At base of 2nd digit 5.6 5.5  (Blank rows = not tested)  Surgery type/Date: Bilateral mastectomy and right ALND 07/27/2023 Number of lymph nodes removed: 20 Current/past treatment (chemo, radiation, hormone therapy): Neoadjuvant chemotherapy Other symptoms:  Heaviness/tightness Yes Pain Yes Pitting edema No Infections Yes Decreased scar mobility Yes Stemmer sign No  PATIENT EDUCATION:  Education details: post op HEP (bil AAROM flexion with hands clasped together, scapular retraction, ER with hands behind head, and shoulder abduction crawling up wall) and wall scapular protraction push offs, Postural TB x 10 with yellow SR, ext, bilateral ER Person educated: Patient Education method: Explanation, Demonstration, Tactile cues, Verbal cues, and Handouts Education comprehension: verbalized understanding and returned demonstration  HOME EXERCISE PROGRAM: Access Code: [email or web address] URL: [email or web address] Date: 09/19/2023 Prepared by: Jarrell Menke  Exercises - Shoulder Internal Rotation Reactive Isometrics  - 1 x daily - 7 x weekly - 1-3 sets - 10 reps - Shoulder External Rotation Reactive Isometrics (Mirrored)  - 1 x daily - 7 x weekly - 1-3 sets - 10 reps - Shoulder Extension Reactive Isometrics with Elbow Extended  - 1 x daily - 7 x weekly - 1-3 sets - 10 reps - Standing Shoulder Flexion Reactive Isometrics with Elbow Extended  - 1 x daily - 7 x weekly - 1-3 sets - 10 reps - Scapular Retraction with Resistance  - 1 x daily - 7 x weekly - 1-3 sets - 10 reps - Supine Shoulder Flexion Extension AAROM with Dowel  - 1 x daily - 7 x weekly - 2 sets - 10 reps - Sidelying  Thoracic Rotation  with Open Book  - 1 x daily - 7 x weekly - 1-2 sets - 10 reps - Supine Shoulder Horizontal Abduction with Resistance  - 1 x daily - 7 x weekly - 2 sets - 10 reps - Standing Shoulder Single Arm PNF D2 Flexion with Resistance  - 1 x daily - 7 x weekly - 1 sets - 10 reps  ASSESSMENT:  CLINICAL IMPRESSION: Pt tolerated all activities very well. She required occasional VC's for postural band, but pt said they felt good. Some trouble maintaining arms at side with ER. ROM visibly improved at end of session. Main compliant is bilateral rib discomfort, but felt good stretch with wall arcs.  Pt will benefit from skilled therapeutic intervention to improve on the following deficits: Decreased knowledge of precautions, impaired UE functional use, pain, decreased ROM, postural dysfunction.   PT treatment/interventions: ADL/Self care home management, [phone]- PT Re-evaluation, 97110-Therapeutic exercises, 97530- Therapeutic activity, 97535- Self Care, 02859- Manual therapy, [phone]- Aquatic Therapy, Patient/Family education, Dry Needling, Manual lymph drainage, and Scar mobilization   GOALS: Goals reviewed with patient? Yes  LONG TERM GOALS:  (STG=LTG)  GOALS Name Target Date  Goal status  1 Pt will demonstrate she has regained shoulder active flexion and abduction ROM back to baseline bilaterally (>/= 150 flexion and >/= 160 abduction)  11/11/2023 Ongoing  2 Patient will report >/= 50% reduction in skin hypersensitivity for improved tolerance to wearing shirts and bras. 11/11/2023 Ongoing  3 Patient will verbalize good understanding of self scar massage and lymphedema risk reduction practices. 11/11/2023 Ongoing  4 Patient will improve her DASH score to be back to zero (baseline) for improved UE function. 11/11/2023 Ongoing     PLAN:  PT FREQUENCY/DURATION: 2x/week for 8 weeks  PLAN FOR NEXT SESSION: check shoulder ROM,Continue shoulder PROM, Rt scapular mobility, pec stretches, joint mobs,    Garments:  Faxed order to [facility],   10/06/23, 8:55 AM Grayce Sheldon, PT [facility] [address] [place], [place] [address] Phone # [phone] Fax [phone]

## 2023-10-07 ENCOUNTER — Inpatient Hospital Stay: Payer: Managed Care, Other (non HMO) | Attending: Hematology and Oncology

## 2023-10-07 ENCOUNTER — Ambulatory Visit: Payer: Managed Care, Other (non HMO)

## 2023-10-07 ENCOUNTER — Inpatient Hospital Stay: Payer: Managed Care, Other (non HMO) | Admitting: Dietician

## 2023-10-07 ENCOUNTER — Other Ambulatory Visit: Payer: Self-pay

## 2023-10-07 ENCOUNTER — Inpatient Hospital Stay: Payer: Managed Care, Other (non HMO)

## 2023-10-07 ENCOUNTER — Encounter: Payer: Self-pay | Admitting: Adult Health

## 2023-10-07 ENCOUNTER — Ambulatory Visit
Admission: RE | Admit: 2023-10-07 | Discharge: 2023-10-07 | Disposition: A | Discharge status: Home / Self Care | Payer: Managed Care, Other (non HMO) | Source: Ambulatory Visit | Attending: Radiation Oncology

## 2023-10-07 ENCOUNTER — Inpatient Hospital Stay (HOSPITAL_BASED_OUTPATIENT_CLINIC_OR_DEPARTMENT_OTHER): Payer: Managed Care, Other (non HMO) | Admitting: Adult Health

## 2023-10-07 VITALS — BP 101/54 | HR 86 | Temp 97.6°F | Resp 16 | Wt 138.5 lb

## 2023-10-07 DIAGNOSIS — Z5112 Encounter for antineoplastic immunotherapy: Secondary | ICD-10-CM | POA: Insufficient documentation

## 2023-10-07 DIAGNOSIS — C50311 Malignant neoplasm of lower-inner quadrant of right female breast: Secondary | ICD-10-CM | POA: Diagnosis not present

## 2023-10-07 DIAGNOSIS — Z171 Estrogen receptor negative status [ER-]: Secondary | ICD-10-CM | POA: Diagnosis not present

## 2023-10-07 DIAGNOSIS — Z923 Personal history of irradiation: Secondary | ICD-10-CM | POA: Insufficient documentation

## 2023-10-07 DIAGNOSIS — Z95828 Presence of other vascular implants and grafts: Secondary | ICD-10-CM

## 2023-10-07 LAB — CBC WITH DIFFERENTIAL (CANCER CENTER ONLY)
Abs Immature Granulocytes: 0 10*3/uL (ref 0.00–0.07)
Basophils Absolute: 0 10*3/uL (ref 0.0–0.1)
Basophils Relative: 0 %
Eosinophils Absolute: 0.4 10*3/uL (ref 0.0–0.5)
Eosinophils Relative: 12 %
HCT: 32.4 % — ABNORMAL LOW (ref 36.0–46.0)
Hemoglobin: 10.7 g/dL — ABNORMAL LOW (ref 12.0–15.0)
Immature Granulocytes: 0 %
Lymphocytes Relative: 16 %
Lymphs Abs: 0.5 10*3/uL — ABNORMAL LOW (ref 0.7–4.0)
MCH: 30.5 pg (ref 26.0–34.0)
MCHC: 33 g/dL (ref 30.0–36.0)
MCV: 92.3 fL (ref 80.0–100.0)
Monocytes Absolute: 0.4 10*3/uL (ref 0.1–1.0)
Monocytes Relative: 12 %
Neutro Abs: 1.9 10*3/uL (ref 1.7–7.7)
Neutrophils Relative %: 60 %
Platelet Count: 197 10*3/uL (ref 150–400)
RBC: 3.51 MIL/uL — ABNORMAL LOW (ref 3.87–5.11)
RDW: 17.2 % — ABNORMAL HIGH (ref 11.5–15.5)
WBC Count: 3.1 10*3/uL — ABNORMAL LOW (ref 4.0–10.5)
nRBC: 0 % (ref 0.0–0.2)

## 2023-10-07 LAB — RAD ONC ARIA SESSION SUMMARY
Course Elapsed Days: 30
Plan Fractions Treated to Date: 11
Plan Fractions Treated to Date: 21
Plan Prescribed Dose Per Fraction: 1.8 Gy
Plan Prescribed Dose Per Fraction: 1.8 Gy
Plan Total Fractions Prescribed: 14
Plan Total Fractions Prescribed: 28
Plan Total Prescribed Dose: 25.2 Gy
Plan Total Prescribed Dose: 50.4 Gy
Reference Point Dosage Given to Date: 37.8 Gy
Reference Point Dosage Given to Date: 37.8 Gy
Reference Point Session Dosage Given: 1.8 Gy
Reference Point Session Dosage Given: 1.8 Gy
Session Number: 21

## 2023-10-07 LAB — CMP (CANCER CENTER ONLY)
ALT: 8 U/L (ref 0–44)
AST: 13 U/L — ABNORMAL LOW (ref 15–41)
Albumin: 4 g/dL (ref 3.5–5.0)
Alkaline Phosphatase: 61 U/L (ref 38–126)
Anion gap: 7 (ref 5–15)
BUN: 13 mg/dL (ref 6–20)
CO2: 26 mmol/L (ref 22–32)
Calcium: 9.2 mg/dL (ref 8.9–10.3)
Chloride: 105 mmol/L (ref 98–111)
Creatinine: 0.64 mg/dL (ref 0.44–1.00)
GFR, Estimated: >60 mL/min (ref >60)
Glucose, Bld: 94 mg/dL (ref 70–99)
Potassium: 3.7 mmol/L (ref 3.5–5.1)
Sodium: 138 mmol/L (ref 135–145)
Total Bilirubin: 0.5 mg/dL (ref 0.0–1.2)
Total Protein: 6.8 g/dL (ref 6.5–8.1)

## 2023-10-07 LAB — TSH: TSH: 1.552 u[IU]/mL (ref 0.350–4.500)

## 2023-10-07 MED ORDER — SODIUM CHLORIDE 0.9 % IV SOLN: Freq: Once | INTRAVENOUS | Status: AC

## 2023-10-07 MED ORDER — HEPARIN SOD (PORK) LOCK FLUSH 100 UNIT/ML IV SOLN
500.0000 [IU] | Freq: Once | INTRAVENOUS | Status: AC | PRN
  Administered 2023-10-07: 500 [IU]

## 2023-10-07 MED ORDER — SODIUM CHLORIDE 0.9% FLUSH
10.0000 mL | INTRAVENOUS | Status: DC | PRN
Start: 2023-10-07 — End: 2023-10-07
  Administered 2023-10-07: 10 mL

## 2023-10-07 MED ORDER — SODIUM CHLORIDE 0.9% FLUSH
10.0000 mL | Freq: Once | INTRAVENOUS | Status: AC
Start: 2023-10-07 — End: 2023-10-07
  Administered 2023-10-07: 10 mL

## 2023-10-07 MED ORDER — SODIUM CHLORIDE 0.9 % IV SOLN
200.0000 mg | Freq: Once | INTRAVENOUS | Status: AC
  Administered 2023-10-07: 200 mg via INTRAVENOUS
  Filled 2023-10-07: qty 200

## 2023-10-07 NOTE — Progress Notes (Signed)
 [facility] Cancer Follow up:    [name], MD [address] [place] [address]   DIAGNOSIS:  Cancer Staging  Malignant neoplasm of lower-inner quadrant of right breast of female, estrogen receptor negative (HCC) Staging form: Breast, AJCC 8th Edition - Clinical: Stage IIIC (cT2, cN3c, cM0, G3, ER-, PR-, HER2-) - Unsigned Histologic grading system: 3 grade system   SUMMARY OF ONCOLOGIC HISTORY: Oncology History  Malignant neoplasm of lower-inner quadrant of right breast of female, estrogen receptor negative (HCC)  01/10/2023 Initial Diagnosis   Palpable lump right axilla x 2 weeks bulky axillary lymphadenopathy ultrasound breast: 2 masses at 4 o'clock position 1.1 cm and 0.6 cm multiple axillary lymph nodes largest 4.5 cm also level 2 and 3 (subpectoral lymph node as well): Biopsy: Grade 3 IDC ER 0%, PR 0%, Ki-67 95%, HER2 1+   01/26/2023 Genetic Testing   Negative [employer] Custom Panel (37 genes).  Report date is 01/26/2023.    The [facility] Custom Cancers + RNA Panel includes sequencing, deletion/duplication, and RNA analysis of the following 37 genes: APC, ATM, AXIN2, BARD1, BMPR1A, BRCA1, BRCA2, BRIP1, CDH1, CDK4*, CDKN2A (p14ARF)*, CDKN2A (p16INK4a), CHEK2, CTNNA1, DICER1, EPCAM*, FH, GREM1*, HOXB13*, MBD4*, MLH1, MSH2, MSH3, MSH6, MUTYH, NF1, NTHL1, PALB2, PMS2, POLD1, POLE, PTEN, RAD51C, RAD51D, SMAD4, SMARCA4, STK11, TP53.  *Genes without RNA analysis.    01/28/2023 - 06/27/2023 Chemotherapy   Patient is on Treatment Plan : BREAST Pembrolizumab  (200) D1 + Carboplatin  (1.5) D1,8,15 + Paclitaxel  (80) D1,8,15 q21d X 4 cycles / Pembrolizumab  (200) D1 + AC D1 q21d x 4 cycles     07/15/2023 -  Chemotherapy   Patient is on Treatment Plan : BREAST Pembrolizumab  (200) q21d x 27 weeks       CURRENT THERAPY: Keytruda ; capecitabine   INTERVAL HISTORY:  Discussed the use of AI scribe software for clinical note transcription with the patient, who gave verbal  consent to proceed.  Suzanne Tucker 43 y.o. female with breast cancer currently on treatment with Enhertu  and undergoing adjuvant radiation is here today for follow-up prior to treatment.  She reports experiencing vertigo, particularly when lying down and turning to the left. This symptom prompted a brief period of concern among the care team, leading to an MRI that is scheduled next week and a course of steroids. The patient notes significant improvement since starting the steroids. They deny any associated symptoms such as tingling or weakness.  The patient also mentions a history of rib and back pain, which a physical therapist attributes to posture and radiation-induced tightness. The patient is currently on Keytruda  and a capecitabine  500 mg, 2 tablets twice daily Monday through Friday, both of which are well-tolerated with no reported side effects.  The patient's recent labs show stable white blood cells and neutrophils, improved hemoglobin levels, and normal platelets. Kidney function, liver function, and thyroid  results are pending.  The patient also reports finding relief from vertigo symptoms with Flonase , and is considering a consultation with an ENT specialist. Despite the ongoing treatment and symptoms, the patient maintains an active lifestyle and positive outlook.   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  MEDICAL HISTORY: Past Medical History:  Diagnosis Date   Anginal pain (HCC) 02/2019   related to MVP   Back pain    Breast cancer (HCC)    Depression    Dysrhythmia    MVP   Mitral prolapse    Sleep apnea    mild no cpap indicated    SURGICAL HISTORY: Past Surgical History:  Procedure Laterality Date   BREAST SURGERY Right 2024   Breast biopsy x 2   CESAREAN SECTION  03/30/2008   MASTECTOMY WITH AXILLARY LYMPH NODE DISSECTION Bilateral 07/27/2023   Procedure: BILATERAL MASTECTOMY WITH RIGHT AXILLARY LYMPH NODE DISSECTION;  Surgeon: Aron Shoulders, MD;  Location: [facility];  Service: General;  Laterality: Bilateral;  PEC BLOCK   PORTACATH PLACEMENT N/A 01/27/2023   Procedure: INSERTION PORT-A-CATH;  Surgeon: Aron Shoulders, MD;  Location: [facility];  Service: General;  Laterality: N/A;  leaving accessed   ROBOTIC ASSISTED TOTAL HYSTERECTOMY Bilateral 03/15/2019   Procedure: XI ROBOTIC ASSISTED TOTAL HYSTERECTOMY with Bilateral Salpingectomy;  Surgeon: Darcel Pool, MD;  Location: [facility];  Service: Gynecology;  Laterality: Bilateral;   TUBAL LIGATION  2017    SOCIAL HISTORY: Social History   Socioeconomic History   Marital status: Married    Spouse name: Not on file   Number of children: Not on file   Years of education: Not on file   Highest education level: Master's degree (e.g., MA, MS, MEng, MEd, MSW, MBA)  Occupational History   Not on file  Tobacco Use   Smoking status: Never   Smokeless tobacco: Never  Vaping Use   Vaping status: Never Used  Substance and Sexual Activity   Alcohol use: Not Currently    Comment:     Drug use: No   Sexual activity: Yes    Birth control/protection:  Surgical  Other Topics Concern   Not on file  Social History Narrative   Not on file   Social Drivers of Health   Financial Resource Strain: Low Risk  (12/27/2022)   Overall Financial Resource Strain (CARDIA)    Difficulty of Paying Living Expenses: Not very hard  Food Insecurity: No Food Insecurity (08/24/2023)   Hunger Vital Sign    Worried About Running Out of Food in the Last Year: Never true    Ran Out of Food in the Last Year: Never true  Transportation Needs: No Transportation Needs (08/24/2023)   PRAPARE - Administrator, Civil Service (Medical): No    Lack of Transportation (Non-Medical): No  Physical Activity: Insufficiently Active (12/27/2022)   Exercise Vital Sign    Days of Exercise per Week: 4 days    Minutes of Exercise per Session: 20 min  Stress: No Stress Concern Present (12/27/2022)   [employer] of Occupational Health - Occupational Stress Questionnaire    Feeling of Stress : Only a little  Social Connections: Moderately Isolated (12/27/2022)   Social Connection and Isolation Panel [NHANES]    Frequency of Communication with Friends and Family: More than three times a week    Frequency of Social Gatherings with Friends and Family: Once a  week    Attends Religious Services: Never    Active Member of Clubs or Organizations: No    Attends Engineer, Structural: Not on file    Marital Status: Married  Catering Manager Violence: Not At Risk (08/24/2023)   Humiliation, Afraid, Rape, and Kick questionnaire    Fear of Current or Ex-Partner: No    Emotionally Abused: No    Physically Abused: No    Sexually Abused: No    FAMILY HISTORY: Family History  Problem Relation Age of Onset   Stroke Mother    Alzheimer's disease Father    Diabetes Maternal Grandmother    Heart disease Maternal Grandmother    Heart attack Maternal Grandmother    Diabetes Maternal Grandfather    Heart disease Maternal Grandfather    Alzheimer's disease Paternal  Grandmother    Dementia Paternal Grandmother    Alzheimer's disease Paternal Grandfather    Dementia Paternal Grandfather    Alcohol abuse Paternal Grandfather     Review of Systems  Constitutional:  Negative for appetite change, chills, fatigue, fever and unexpected weight change.  HENT:   Negative for hearing loss, lump/mass and trouble swallowing.   Eyes:  Negative for eye problems and icterus.  Respiratory:  Negative for chest tightness, cough and shortness of breath.   Cardiovascular:  Negative for chest pain, leg swelling and palpitations.  Gastrointestinal:  Negative for abdominal distention, abdominal pain, constipation, diarrhea, nausea and vomiting.  Endocrine: Negative for hot flashes.  Genitourinary:  Negative for difficulty urinating.   Musculoskeletal:  Negative for arthralgias.  Skin:  Negative for itching and rash.  Neurological:  Negative for dizziness, extremity weakness, headaches and numbness.  Hematological:  Negative for adenopathy. Does not bruise/bleed easily.  Psychiatric/Behavioral:  Negative for depression. The patient is not nervous/anxious.       PHYSICAL EXAMINATION    Vitals:   10/07/23 1051  BP: (!) 101/54  Pulse: 86  Resp: 16  Temp: 97.6 F (36.4 C)  SpO2: 100%    Physical Exam Constitutional:      General: She is not in acute distress.    Appearance: Normal appearance. She is not toxic-appearing.  HENT:     Head: Normocephalic and atraumatic.     Mouth/Throat:     Mouth: Mucous membranes are moist.     Pharynx: Oropharynx is clear. No oropharyngeal exudate or posterior oropharyngeal erythema.  Eyes:     General: No scleral icterus. Cardiovascular:     Rate and Rhythm: Normal rate and regular rhythm.     Pulses: Normal pulses.     Heart sounds: Normal heart sounds.  Pulmonary:     Effort: Pulmonary effort is normal.     Breath sounds: Normal breath sounds.  Abdominal:     General: Abdomen is flat. Bowel sounds are normal. There  is no distension.     Palpations: Abdomen is soft.     Tenderness: There is no abdominal tenderness.  Musculoskeletal:        General: No swelling.     Cervical back: Neck supple.  Lymphadenopathy:     Cervical: No cervical adenopathy.  Skin:    General: Skin is warm and dry.     Findings: No rash.  Neurological:     General: No focal deficit present.     Mental Status: She is alert.  Psychiatric:        Mood and Affect: Mood normal.        Behavior: Behavior normal.  LABORATORY DATA:  CBC    Component Value Date/Time   WBC 3.1 (L) 10/07/2023 1022   WBC 10.0 08/11/2023 0353   RBC 3.51 (L) 10/07/2023 1022   HGB 10.7 (L) 10/07/2023 1022   HGB 11.6 04/08/2022 1037   HCT 32.4 (L) 10/07/2023 1022   HCT 35.8 04/08/2022 1037   PLT 197 10/07/2023 1022   PLT 244 04/08/2022 1037   MCV 92.3 10/07/2023 1022   MCV 89 04/08/2022 1037   MCH 30.5 10/07/2023 1022   MCHC 33.0 10/07/2023 1022   RDW 17.2 (H) 10/07/2023 1022   RDW 12.1 04/08/2022 1037   LYMPHSABS 0.5 (L) 10/07/2023 1022   LYMPHSABS 1.3 04/08/2022 1037   MONOABS 0.4 10/07/2023 1022   EOSABS 0.4 10/07/2023 1022   EOSABS 0.1 04/08/2022 1037   BASOSABS 0.0 10/07/2023 1022   BASOSABS 0.0 04/08/2022 1037    CMP     Component Value Date/Time   NA 138 10/07/2023 1022   NA 140 04/08/2022 1037   K 3.7 10/07/2023 1022   CL 105 10/07/2023 1022   CO2 26 10/07/2023 1022   GLUCOSE 94 10/07/2023 1022   BUN 13 10/07/2023 1022   BUN 9 04/08/2022 1037   CREATININE 0.64 10/07/2023 1022   CALCIUM  9.2 10/07/2023 1022   PROT 6.8 10/07/2023 1022   PROT 6.5 04/08/2022 1037   ALBUMIN 4.0 10/07/2023 1022   ALBUMIN 4.3 04/08/2022 1037   AST 13 (L) 10/07/2023 1022   ALT 8 10/07/2023 1022   ALKPHOS 61 10/07/2023 1022   BILITOT 0.5 10/07/2023 1022   GFRNONAA >60 10/07/2023 1022   GFRAA 132 11/04/2020 1208        ASSESSMENT and THERAPY PLAN:   Malignant neoplasm of lower-inner quadrant of right breast of female,  estrogen receptor negative (HCC) 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%, HER2 1+  01/25/2023: PET/CT: Right breast cancer, right axilla, retropectoral and right cervical lymph nodes   Treatment plan: Neoadjuvant chemotherapy with Taxol  carbo Keytruda  weekly x 12 followed by Adriamycin  Cytoxan  Keytruda  followed by Keytruda  maintenance 07/27/2023: Bilateral mastectomies: Left mastectomy: Benign, right mastectomy: No evidence of residual cancer, 8/20 lymph nodes positive with extranodal extension in 2 lymph nodes Adjuvant radiation therapy 09/08/2023-10/24/2022 -------------------------------------------------------------------------------------------------------- Treatment plan: Keytruda  with capecitabine  maintenance therapy On Keytruda  and capecitabine  and is tolerating medications well.  Will continue treatment without any dose modifications.  Vertigo Recent onset of vertigo when turning to the left. No other neurological symptoms reported. Currently on a course of steroids which has improved symptoms. -Continue current course of steroids. -Patient to proceed with MRI for further evaluation -Consider ENT referral if symptoms persist after completion of steroid course.  Radiation Therapy Currently undergoing radiation therapy. No significant side effects reported. -Continue current radiation therapy regimen.  Musculoskeletal Pain Reports of rib and back pain. Physical therapist attributes this to posture and radiation-induced tightness. -Continue physical therapy.  General Health Maintenance -Plan MRI to further evaluate vertigo symptoms. -Review MRI results at next appointment on 10/28/2023.     All questions were answered. The patient knows to  call the clinic with any problems, questions or concerns. We can certainly see the patient much sooner if necessary.  Total encounter time:30 minutes*in face-to-face visit time, chart review, lab review,  care coordination, order entry, and documentation of the encounter time.    Morna Kendall, NP 10/07/23 3:55 PM Medical Oncology and Hematology [facility] [address] [place], [place] [address] Tel. [phone]    Fax. [phone]  *Total Encounter Time as defined by the Centers for Medicare and Medicaid Services includes, in addition to the face-to-face time of a patient visit (documented in the note above) non-face-to-face time: obtaining and reviewing outside history, ordering and reviewing medications, tests or procedures, care coordination (communications with other health care professionals or caregivers) and documentation in the medical record.

## 2023-10-07 NOTE — Progress Notes (Signed)
 Nutrition Follow-up:  Patient with estrogen receptor negative breast cancer of right breast. She is currently receiving keytruda  q21d.   Met with patient in infusion. Patient reports nausea has resolved and feeling well overall. Patient is eating 3 meals daily. Recently snacking on oranges as her daughter has been sick. She denies nutrition impact symptoms at this time.   Medications: reviewed   Labs: reviewed   Anthropometrics: Wt 138 lb 8 oz today    NUTRITION DIAGNOSIS: Food and nutrition related knowledge deficit - improved     INTERVENTION:  Encouraged good sources of protein at every meal     MONITORING, EVALUATION, GOAL: wt trends, intake   NEXT VISIT: To be scheduled as needed. Patient has contact information and encouraged to call with nutrition questions/concerns

## 2023-10-07 NOTE — Patient Instructions (Signed)

## 2023-10-07 NOTE — Assessment & Plan Note (Signed)
 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%, HER2 1+  01/25/2023: PET/CT: Right breast cancer, right axilla, retropectoral and right cervical lymph nodes   Treatment plan: Neoadjuvant chemotherapy with Taxol  carbo Keytruda  weekly x 12 followed by Adriamycin  Cytoxan  Keytruda  followed by Keytruda  maintenance 07/27/2023: Bilateral mastectomies: Left mastectomy: Benign, right mastectomy: No evidence of residual cancer, 8/20 lymph nodes positive with extranodal extension in 2 lymph nodes Adjuvant radiation therapy 09/08/2023-10/24/2022 -------------------------------------------------------------------------------------------------------- Treatment plan: Keytruda  with capecitabine  maintenance therapy On Keytruda  and capecitabine  and is tolerating medications well.  Will continue treatment without any dose modifications.  Vertigo Recent onset of vertigo when turning to the left. No other neurological symptoms reported. Currently on a course of steroids which has improved symptoms. -Continue current course of steroids. -Patient to proceed with MRI for further evaluation -Consider ENT referral if symptoms persist after completion of steroid course.  Radiation Therapy Currently undergoing radiation therapy. No significant side effects reported. -Continue current radiation therapy regimen.  Musculoskeletal Pain Reports of rib and back pain. Physical therapist attributes this to posture and radiation-induced tightness. -Continue physical therapy.  General Health Maintenance -Plan MRI to further evaluate vertigo symptoms. -Review MRI results at next appointment on 10/28/2023.

## 2023-10-08 LAB — T4: T4, Total: 9.5 ug/dL (ref 4.5–12.0)

## 2023-10-10 ENCOUNTER — Ambulatory Visit
Admission: RE | Admit: 2023-10-10 | Discharge: 2023-10-10 | Disposition: A | Discharge status: Home / Self Care | Payer: Managed Care, Other (non HMO) | Source: Ambulatory Visit | Attending: Radiation Oncology | Admitting: Radiation Oncology

## 2023-10-10 ENCOUNTER — Ambulatory Visit: Payer: Managed Care, Other (non HMO)

## 2023-10-10 ENCOUNTER — Encounter: Payer: Self-pay | Admitting: Rehabilitation

## 2023-10-10 ENCOUNTER — Other Ambulatory Visit: Payer: Self-pay

## 2023-10-10 ENCOUNTER — Ambulatory Visit: Payer: Managed Care, Other (non HMO) | Admitting: Rehabilitation

## 2023-10-10 DIAGNOSIS — C50311 Malignant neoplasm of lower-inner quadrant of right female breast: Secondary | ICD-10-CM | POA: Diagnosis not present

## 2023-10-10 DIAGNOSIS — Z483 Aftercare following surgery for neoplasm: Secondary | ICD-10-CM

## 2023-10-10 DIAGNOSIS — R293 Abnormal posture: Secondary | ICD-10-CM

## 2023-10-10 DIAGNOSIS — R252 Cramp and spasm: Secondary | ICD-10-CM

## 2023-10-10 DIAGNOSIS — M25611 Stiffness of right shoulder, not elsewhere classified: Secondary | ICD-10-CM

## 2023-10-10 DIAGNOSIS — M25612 Stiffness of left shoulder, not elsewhere classified: Secondary | ICD-10-CM

## 2023-10-10 LAB — RAD ONC ARIA SESSION SUMMARY
Course Elapsed Days: 33
Plan Fractions Treated to Date: 11
Plan Fractions Treated to Date: 22
Plan Prescribed Dose Per Fraction: 1.8 Gy
Plan Prescribed Dose Per Fraction: 1.8 Gy
Plan Total Fractions Prescribed: 14
Plan Total Fractions Prescribed: 28
Plan Total Prescribed Dose: 25.2 Gy
Plan Total Prescribed Dose: 50.4 Gy
Reference Point Dosage Given to Date: 39.6 Gy
Reference Point Dosage Given to Date: 39.6 Gy
Reference Point Session Dosage Given: 1.8 Gy
Reference Point Session Dosage Given: 1.8 Gy
Session Number: 22

## 2023-10-10 NOTE — Therapy (Signed)
 OUTPATIENT PHYSICAL THERAPY BREAST CANCER POST OP TREATMENT    Patient Name: Suzanne Tucker MRN: [id number] DOB:1981/09/18, 43 y.o., female Today's Date: 10/10/2023  END OF SESSION:  PT End of Session - 10/10/23 1054     Visit Number 12    Number of Visits 25    Date for PT Re-Evaluation 11/11/23    PT Start Time 1100    PT Stop Time 1143    PT Time Calculation (min) 43 min    Activity Tolerance Patient tolerated treatment well    Behavior During Therapy [facility] for tasks assessed/performed                 Past Medical History:  Diagnosis Date   Anginal pain (HCC) 02/2019   related to MVP   Back pain    Breast cancer (HCC)    Depression    Dysrhythmia    MVP   Mitral prolapse    Sleep apnea    mild no cpap indicated   Past Surgical History:  Procedure Laterality Date   BREAST SURGERY Right 2024   Breast biopsy x 2   CESAREAN SECTION  03/30/2008   MASTECTOMY WITH AXILLARY LYMPH NODE DISSECTION Bilateral 07/27/2023   Procedure: BILATERAL MASTECTOMY WITH RIGHT AXILLARY LYMPH NODE DISSECTION;  Surgeon: Aron Shoulders, MD;  Location: [facility];  Service: General;  Laterality: Bilateral;  PEC BLOCK   PORTACATH PLACEMENT N/A 01/27/2023   Procedure: INSERTION PORT-A-CATH;  Surgeon: Aron Shoulders, MD;  Location: [facility];  Service: General;  Laterality: N/A;  leaving accessed   ROBOTIC ASSISTED TOTAL HYSTERECTOMY Bilateral 03/15/2019   Procedure: XI ROBOTIC ASSISTED TOTAL HYSTERECTOMY with Bilateral Salpingectomy;  Surgeon: Darcel Pool, MD;  Location: [facility];  Service: Gynecology;  Laterality: Bilateral;   TUBAL LIGATION  2017   Patient Active Problem List   Diagnosis Date Noted   Complication, postoperative infection 08/10/2023   Breast cancer of lower-inner quadrant of right female breast (HCC) 07/27/2023   Dysautonomia (HCC) 07/25/2023   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 Shoulders Aron  REFERRING DIAG: Right breast cancer  THERAPY DIAG:  Malignant neoplasm of lower-inner quadrant of right breast of female, estrogen receptor negative (HCC)  Aftercare following surgery for neoplasm  Abnormal posture  Stiffness of left shoulder, not elsewhere classified  Stiffness of right shoulder, not elsewhere classified  Cramp and spasm  Rationale for Evaluation and Treatment: Rehabilitation  ONSET DATE: 07/27/2023  SUBJECTIVE:  SUBJECTIVE STATEMENT: It is just tight and burned a little.  Overall I feel good    From eval: Patient reports she underwent neoadjuvant chemotherapy from 01/28/2023 - 06/27/2023 followed by a bilateral mastectomy and right axillary lymph node dissection (8 of 20 nodes positive) on 07/27/2023. She is triple negative and will undergo radiation on the right side. She is having a PET scan to ensure there are no metastases. She had a breast infection and was hospitalized last week from 08/10/2023 - 08/13/2023. She is on antibiotics until the end of this week.  PERTINENT HISTORY:  Patient was diagnosed on 01/07/2023 with right grade 3 invasive ductal carcinoma breast cancer. 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 She had a bilateral mastectomy and right axillary lymph node dissection (8 of 20 nodes positive) on 07/27/2023.  PATIENT GOALS:  Reassess how  my recovery is going related to arm function, pain, and swelling.  PAIN: 08/26/2023 Are you having pain? Yes: NPRS scale: 1/10 Pain location: R shoulder - axilla Pain description: aching, stabbing Aggravating factors: nothing Relieving factors: sitting still   PRECAUTIONS: Recent Surgery, right UE Lymphedema risk  RED FLAGS: None   ACTIVITY LEVEL / LEISURE: She has not returned to exercising   OBJECTIVE:  Today's Treatment DATE: 10/10/23 Overhead pulleys flexion and abduction x 2:30 min ea Ball rolls flexion x 10, abduction x 5 B theraband with yellow supine ;scapular retraction, shoulder extension, and bilateral ER x 10 Snow angels x 5  Reviewed final HEP and goals Completed QDASH Completed SOZO   10/06/2023 Overhead pulleys flexion and abduction x 2:30 min ea Ball rolls flexion x 10, abduction x 5 B Wall arc B x 4 ea, x 5sec hold Postural theraband with yellow;scapular retraction, shoulder extension, and bilateral ER x 10 Bilateral AROM shoulder flexion, scaption x 5, horizontal abd x 5 Supine horizontal abd yellow x 10 Snow angels x 5  PROM bilateral shoulders flexion, scaption, abd, ER  09/27/23 Measured for compression sleeve today.  Size 2 max regular: Floral Purple Henna. Will fax order due to special color request.   Overhead shoulder pulleys for flexion and abduction x2 min each Supine snow angel pectoralis stretch 2x60  Active snow angels x 5  Supine flexion with 2# on dowel rod x10  PATIENT SURVEYS:  01/19/2023: QUICK DASH: 20.45 08/15/2023:  Quick DASH 36.36 10/10/23: 4.55%   Quick Dash - 10/10/23 0001     Open a tight or new jar No difficulty    Do heavy household chores (wash walls, wash floors) No difficulty    Carry a shopping bag or briefcase No difficulty    Wash your back No difficulty    Use a knife to cut food No difficulty    Recreational activities in which you take some force or impact through your arm, shoulder, or hand (golf, hammering,  tennis) Mild difficulty    During the past week, to what extent has your arm, shoulder or hand problem interfered with your normal social activities with family, friends, neighbors, or groups? Not at all    During the past week, to what extent has your arm, shoulder or hand problem limited your work or other regular daily activities Not at all    Arm, shoulder, or hand pain. Mild    Tingling (pins and needles) in your arm, shoulder, or hand None    Difficulty Sleeping No difficulty    DASH Score 4.55 %  OBSERVATIONS: Bilateral chest incisions appear to be healing well with steri-strips in place. There is no visible significant swelling present. No redness or signs of infection present. Hypersensitivity present in in chest and along midline at sternum.  POSTURE:  Forward head and rounded shoulders  LYMPHEDEMA ASSESSMENT:   UPPER EXTREMITY AROM/PROM:   A/PROM RIGHT   eval   RIGHT 08/15/2023 RIGHT 09/05/23 10/10/23  Shoulder extension 53 40 76   Shoulder flexion 157 113 148 153  Shoulder abduction 159 132 145 170  Shoulder internal rotation 71 74 75   Shoulder external rotation 86 75 78 85                          (Blank rows = not tested)   A/PROM LEFT   eval LEFT 08/15/2023 LEFT 09/05/23 10/10/23  Shoulder extension 43 48 78   Shoulder flexion 150 138 158 160  Shoulder abduction 160 144 149 170  Shoulder internal rotation 72 70 70   Shoulder external rotation 76 80 77                           (Blank rows = not tested)   CERVICAL AROM: All within normal limits   UPPER EXTREMITY STRENGTH: WFL   LYMPHEDEMA ASSESSMENTS:    LANDMARK RIGHT   eval RIGHT 08/15/2023  10 cm proximal to olecranon process 28.5 27.2  Olecranon process 24.1 23.1  10 cm proximal to ulnar styloid process 23.6 21.7  Just proximal to ulnar styloid process 15.6 15  Across hand at thumb web space 17.7 18  At base of 2nd digit 5.9 5.8  (Blank rows = not tested)   LANDMARK LEFT   eval  LEFT 08/15/2023  10 cm proximal to olecranon process 28.6 26.8  Olecranon process 23.8 23.3  10 cm proximal to ulnar styloid process 21.9 21.7  Just proximal to ulnar styloid process 15.4 14.6  Across hand at thumb web space 17.7 17.2  At base of 2nd digit 5.6 5.5  (Blank rows = not tested)  Surgery type/Date: Bilateral mastectomy and right ALND 07/27/2023 Number of lymph nodes removed: 20 Current/past treatment (chemo, radiation, hormone therapy): Neoadjuvant chemotherapy Other symptoms:  Heaviness/tightness Yes Pain Yes Pitting edema No Infections Yes Decreased scar mobility Yes Stemmer sign No  PATIENT EDUCATION:  Education details: post op HEP (bil AAROM flexion with hands clasped together, scapular retraction, ER with hands behind head, and shoulder abduction crawling up wall) and wall scapular protraction push offs, Postural TB x 10 with yellow SR, ext, bilateral ER Person educated: Patient Education method: Explanation, Demonstration, Tactile cues, Verbal cues, and Handouts Education comprehension: verbalized understanding and returned demonstration  HOME EXERCISE PROGRAM: Access Code: [email or web address] URL: [email or web address] Date: 10/10/2023 Prepared by: Saddie Raw  Exercises - Scapular Retraction with Resistance  - 1 x daily - 7 x weekly - 1-3 sets - 10 reps - Supine Shoulder Flexion Extension AAROM with Dowel  - 1 x daily - 7 x weekly - 2 sets - 10 reps - Sidelying Thoracic Rotation with Open Book  - 1 x daily - 7 x weekly - 1-2 sets - 10 reps - Supine Shoulder Horizontal Abduction with Resistance  - 1 x daily - 2-3 x weekly - 2 sets - 10 reps - Standing Shoulder Single Arm PNF D2 Flexion with Resistance  - 1 x daily - 2-3 x weekly - 1 sets - 10 reps -  Supine Shoulder External Rotation with Resistance  - 1 x daily - 2-3 x weekly - 1 sets - 10 reps - 2-3 second hold - Single Arm Doorway Pec Stretch at 90 Degrees Abduction  - 1 x daily - 3 reps - 20-30 second  hold  ASSESSMENT:  CLINICAL IMPRESSION:  Pt has met all goals - last goal was modified to reflect baseline QDASH vs 0 which was noted.    She is completing radiation and starting to get some burn/redness.  She feels comfortable with her HEP at this point and knows she can return at any time.  Will continue with SOZO screens.   Pt will benefit from skilled therapeutic intervention to improve on the following deficits: Decreased knowledge of precautions, impaired UE functional use, pain, decreased ROM, postural dysfunction.   PT treatment/interventions: ADL/Self care home management, [phone]- PT Re-evaluation, 97110-Therapeutic exercises, 97530- Therapeutic activity, 97535- Self Care, [id number]- Manual therapy, [phone]- Aquatic Therapy, Patient/Family education, Dry Needling, Manual lymph drainage, and Scar mobilization   GOALS: Goals reviewed with patient? Yes  LONG TERM GOALS:  (STG=LTG)  GOALS Name Target Date  Goal status  1 Pt will demonstrate she has regained shoulder active flexion and abduction ROM back to baseline bilaterally (>/= 150 flexion and >/= 160 abduction)  11/11/2023 MET  2 Patient will report >/= 50% reduction in skin hypersensitivity for improved tolerance to wearing shirts and bras. 11/11/2023 MET  3 Patient will verbalize good understanding of self scar massage and lymphedema risk reduction practices. 11/11/2023 MET  4 Patient will improve her DASH score to be back to zero (baseline) for improved UE function. 11/11/2023 MET     PLAN:  PT FREQUENCY/DURATION: 2x/week for 8 weeks  PLAN FOR NEXT SESSION: check shoulder ROM,Continue shoulder PROM, Rt scapular mobility, pec stretches, joint mobs,   Garments:  Faxed order to comfort care,   10/10/23, 12:24 PM Saddie Raw, PT   PHYSICAL THERAPY DISCHARGE SUMMARY  Visits from Start of Care: 12  Current functional level related to goals / functional outcomes: Goals met   Remaining deficits: Lymphedema risk , Rt sided risk  for post radiation stiffness   Education / Equipment: HEP, lymphedema, compression  Plan: Patient agrees to discharge.   Patient is being discharged due to meeting the stated rehab goals.

## 2023-10-11 ENCOUNTER — Other Ambulatory Visit: Payer: Self-pay

## 2023-10-11 ENCOUNTER — Ambulatory Visit
Admission: RE | Admit: 2023-10-11 | Discharge: 2023-10-11 | Disposition: A | Discharge status: Home / Self Care | Payer: Managed Care, Other (non HMO) | Source: Ambulatory Visit | Attending: Radiation Oncology | Admitting: Radiation Oncology

## 2023-10-11 ENCOUNTER — Ambulatory Visit: Payer: Managed Care, Other (non HMO)

## 2023-10-11 DIAGNOSIS — C50311 Malignant neoplasm of lower-inner quadrant of right female breast: Secondary | ICD-10-CM | POA: Diagnosis not present

## 2023-10-11 LAB — RAD ONC ARIA SESSION SUMMARY
Course Elapsed Days: 34
Plan Fractions Treated to Date: 12
Plan Fractions Treated to Date: 23
Plan Prescribed Dose Per Fraction: 1.8 Gy
Plan Prescribed Dose Per Fraction: 1.8 Gy
Plan Total Fractions Prescribed: 14
Plan Total Fractions Prescribed: 28
Plan Total Prescribed Dose: 25.2 Gy
Plan Total Prescribed Dose: 50.4 Gy
Reference Point Dosage Given to Date: 41.4 Gy
Reference Point Dosage Given to Date: 41.4 Gy
Reference Point Session Dosage Given: 1.8 Gy
Reference Point Session Dosage Given: 1.8 Gy
Session Number: 23

## 2023-10-12 ENCOUNTER — Ambulatory Visit: Payer: Managed Care, Other (non HMO)

## 2023-10-12 ENCOUNTER — Ambulatory Visit
Admission: RE | Admit: 2023-10-12 | Discharge: 2023-10-12 | Disposition: A | Discharge status: Home / Self Care | Payer: Managed Care, Other (non HMO) | Source: Ambulatory Visit | Attending: Radiation Oncology | Admitting: Radiation Oncology

## 2023-10-12 ENCOUNTER — Other Ambulatory Visit: Payer: Self-pay

## 2023-10-12 ENCOUNTER — Ambulatory Visit: Payer: Managed Care, Other (non HMO) | Admitting: Rehabilitation

## 2023-10-12 ENCOUNTER — Encounter: Payer: Managed Care, Other (non HMO) | Admitting: Physical Therapy

## 2023-10-12 DIAGNOSIS — C50311 Malignant neoplasm of lower-inner quadrant of right female breast: Secondary | ICD-10-CM

## 2023-10-12 LAB — RAD ONC ARIA SESSION SUMMARY
Course Elapsed Days: 35
Plan Fractions Treated to Date: 12
Plan Fractions Treated to Date: 24
Plan Prescribed Dose Per Fraction: 1.8 Gy
Plan Prescribed Dose Per Fraction: 1.8 Gy
Plan Total Fractions Prescribed: 14
Plan Total Fractions Prescribed: 28
Plan Total Prescribed Dose: 25.2 Gy
Plan Total Prescribed Dose: 50.4 Gy
Reference Point Dosage Given to Date: 43.2 Gy
Reference Point Dosage Given to Date: 43.2 Gy
Reference Point Session Dosage Given: 1.8 Gy
Reference Point Session Dosage Given: 1.8 Gy
Session Number: 24

## 2023-10-12 MED ORDER — GADOPICLENOL 0.5 MMOL/ML IV SOLN
7.0000 mL | Freq: Once | INTRAVENOUS | Status: AC | PRN
  Administered 2023-10-12: 7 mL via INTRAVENOUS

## 2023-10-13 ENCOUNTER — Ambulatory Visit: Payer: Managed Care, Other (non HMO)

## 2023-10-13 ENCOUNTER — Other Ambulatory Visit: Payer: Self-pay

## 2023-10-13 ENCOUNTER — Ambulatory Visit
Admission: RE | Admit: 2023-10-13 | Discharge: 2023-10-13 | Disposition: A | Discharge status: Home / Self Care | Payer: Managed Care, Other (non HMO) | Source: Ambulatory Visit | Attending: Radiation Oncology | Admitting: Radiation Oncology

## 2023-10-13 DIAGNOSIS — C50311 Malignant neoplasm of lower-inner quadrant of right female breast: Secondary | ICD-10-CM | POA: Diagnosis not present

## 2023-10-13 LAB — RAD ONC ARIA SESSION SUMMARY
Course Elapsed Days: 36
Plan Fractions Treated to Date: 13
Plan Fractions Treated to Date: 25
Plan Prescribed Dose Per Fraction: 1.8 Gy
Plan Prescribed Dose Per Fraction: 1.8 Gy
Plan Total Fractions Prescribed: 14
Plan Total Fractions Prescribed: 28
Plan Total Prescribed Dose: 25.2 Gy
Plan Total Prescribed Dose: 50.4 Gy
Reference Point Dosage Given to Date: 45 Gy
Reference Point Dosage Given to Date: 45 Gy
Reference Point Session Dosage Given: 1.8 Gy
Reference Point Session Dosage Given: 1.8 Gy
Session Number: 25

## 2023-10-14 ENCOUNTER — Encounter: Payer: Self-pay | Admitting: Radiation Oncology

## 2023-10-14 ENCOUNTER — Ambulatory Visit: Payer: Managed Care, Other (non HMO)

## 2023-10-17 ENCOUNTER — Other Ambulatory Visit: Payer: Self-pay

## 2023-10-17 ENCOUNTER — Encounter: Payer: Self-pay | Admitting: Physical Therapy

## 2023-10-17 ENCOUNTER — Ambulatory Visit: Payer: Managed Care, Other (non HMO)

## 2023-10-17 ENCOUNTER — Ambulatory Visit
Admission: RE | Admit: 2023-10-17 | Discharge: 2023-10-17 | Disposition: A | Discharge status: Home / Self Care | Payer: Managed Care, Other (non HMO) | Source: Ambulatory Visit | Attending: Radiation Oncology | Admitting: Radiation Oncology

## 2023-10-17 ENCOUNTER — Ambulatory Visit: Payer: Managed Care, Other (non HMO) | Admitting: Radiation Oncology

## 2023-10-17 DIAGNOSIS — C50311 Malignant neoplasm of lower-inner quadrant of right female breast: Secondary | ICD-10-CM | POA: Diagnosis not present

## 2023-10-17 DIAGNOSIS — H938X2 Other specified disorders of left ear: Secondary | ICD-10-CM

## 2023-10-17 LAB — RAD ONC ARIA SESSION SUMMARY
Course Elapsed Days: 40
Plan Fractions Treated to Date: 13
Plan Fractions Treated to Date: 26
Plan Prescribed Dose Per Fraction: 1.8 Gy
Plan Prescribed Dose Per Fraction: 1.8 Gy
Plan Total Fractions Prescribed: 14
Plan Total Fractions Prescribed: 28
Plan Total Prescribed Dose: 25.2 Gy
Plan Total Prescribed Dose: 50.4 Gy
Reference Point Dosage Given to Date: 46.8 Gy
Reference Point Dosage Given to Date: 46.8 Gy
Reference Point Session Dosage Given: 1.8 Gy
Reference Point Session Dosage Given: 1.8 Gy
Session Number: 26

## 2023-10-18 ENCOUNTER — Ambulatory Visit: Payer: Managed Care, Other (non HMO)

## 2023-10-18 ENCOUNTER — Ambulatory Visit
Admission: RE | Admit: 2023-10-18 | Discharge: 2023-10-18 | Disposition: A | Discharge status: Home / Self Care | Payer: Managed Care, Other (non HMO) | Source: Ambulatory Visit | Attending: Radiation Oncology | Admitting: Radiation Oncology

## 2023-10-18 ENCOUNTER — Other Ambulatory Visit: Payer: Self-pay

## 2023-10-18 DIAGNOSIS — C50311 Malignant neoplasm of lower-inner quadrant of right female breast: Secondary | ICD-10-CM | POA: Diagnosis not present

## 2023-10-18 LAB — RAD ONC ARIA SESSION SUMMARY
Course Elapsed Days: 41
Plan Fractions Treated to Date: 14
Plan Fractions Treated to Date: 27
Plan Prescribed Dose Per Fraction: 1.8 Gy
Plan Prescribed Dose Per Fraction: 1.8 Gy
Plan Total Fractions Prescribed: 14
Plan Total Fractions Prescribed: 28
Plan Total Prescribed Dose: 25.2 Gy
Plan Total Prescribed Dose: 50.4 Gy
Reference Point Dosage Given to Date: 48.6 Gy
Reference Point Dosage Given to Date: 48.6 Gy
Reference Point Session Dosage Given: 1.8 Gy
Reference Point Session Dosage Given: 1.8 Gy
Session Number: 27

## 2023-10-19 ENCOUNTER — Ambulatory Visit
Admission: RE | Admit: 2023-10-19 | Discharge: 2023-10-19 | Discharge status: Home / Self Care | Payer: Managed Care, Other (non HMO) | Source: Ambulatory Visit | Attending: Radiation Oncology

## 2023-10-19 ENCOUNTER — Ambulatory Visit: Payer: Managed Care, Other (non HMO)

## 2023-10-19 ENCOUNTER — Other Ambulatory Visit: Payer: Self-pay

## 2023-10-19 DIAGNOSIS — C50311 Malignant neoplasm of lower-inner quadrant of right female breast: Secondary | ICD-10-CM | POA: Diagnosis not present

## 2023-10-19 LAB — RAD ONC ARIA SESSION SUMMARY
Course Elapsed Days: 42
Plan Fractions Treated to Date: 14
Plan Fractions Treated to Date: 28
Plan Prescribed Dose Per Fraction: 1.8 Gy
Plan Prescribed Dose Per Fraction: 1.8 Gy
Plan Total Fractions Prescribed: 14
Plan Total Fractions Prescribed: 28
Plan Total Prescribed Dose: 25.2 Gy
Plan Total Prescribed Dose: 50.4 Gy
Reference Point Dosage Given to Date: 50.4 Gy
Reference Point Dosage Given to Date: 50.4 Gy
Reference Point Session Dosage Given: 1.8 Gy
Reference Point Session Dosage Given: 1.8 Gy
Session Number: 28

## 2023-10-20 ENCOUNTER — Ambulatory Visit: Payer: Managed Care, Other (non HMO)

## 2023-10-20 ENCOUNTER — Encounter: Payer: Self-pay | Admitting: Hematology and Oncology

## 2023-10-20 ENCOUNTER — Ambulatory Visit
Admission: RE | Admit: 2023-10-20 | Discharge: 2023-10-20 | Disposition: A | Discharge status: Home / Self Care | Payer: Managed Care, Other (non HMO) | Source: Ambulatory Visit | Attending: Radiation Oncology | Admitting: Radiation Oncology

## 2023-10-20 ENCOUNTER — Other Ambulatory Visit: Payer: Self-pay

## 2023-10-20 ENCOUNTER — Other Ambulatory Visit: Payer: Self-pay | Admitting: *Deleted

## 2023-10-20 DIAGNOSIS — C50311 Malignant neoplasm of lower-inner quadrant of right female breast: Secondary | ICD-10-CM

## 2023-10-20 DIAGNOSIS — H8110 Benign paroxysmal vertigo, unspecified ear: Secondary | ICD-10-CM

## 2023-10-20 LAB — RAD ONC ARIA SESSION SUMMARY
Course Elapsed Days: 43
Plan Fractions Treated to Date: 1
Plan Prescribed Dose Per Fraction: 1.8 Gy
Plan Total Fractions Prescribed: 5
Plan Total Prescribed Dose: 9 Gy
Reference Point Dosage Given to Date: 1.8 Gy
Reference Point Session Dosage Given: 1.8 Gy
Session Number: 29

## 2023-10-20 NOTE — Progress Notes (Signed)
Per NP request, RN placed order to PT for vestibular training due to pt ongoing vertigo symptoms.

## 2023-10-21 ENCOUNTER — Ambulatory Visit: Payer: Managed Care, Other (non HMO)

## 2023-10-21 ENCOUNTER — Other Ambulatory Visit: Payer: Self-pay

## 2023-10-21 ENCOUNTER — Telehealth: Payer: Self-pay

## 2023-10-21 ENCOUNTER — Ambulatory Visit
Admission: RE | Admit: 2023-10-21 | Discharge: 2023-10-21 | Disposition: A | Discharge status: Home / Self Care | Payer: Managed Care, Other (non HMO) | Source: Ambulatory Visit | Attending: Radiation Oncology

## 2023-10-21 DIAGNOSIS — C50311 Malignant neoplasm of lower-inner quadrant of right female breast: Secondary | ICD-10-CM | POA: Diagnosis not present

## 2023-10-21 LAB — RAD ONC ARIA SESSION SUMMARY
Course Elapsed Days: 44
Plan Fractions Treated to Date: 2
Plan Prescribed Dose Per Fraction: 1.8 Gy
Plan Total Fractions Prescribed: 5
Plan Total Prescribed Dose: 9 Gy
Reference Point Dosage Given to Date: 3.6 Gy
Reference Point Session Dosage Given: 1.8 Gy
Session Number: 30

## 2023-10-21 NOTE — Telephone Encounter (Signed)
Called and spoke with patient to relay update that per Dr. Wyline Copas patient's most recent MRI did not show anything that would explain patient's vertigo. Patient expressed appreciation of call and confirmed that she did have an upcoming appointment with ENT as well as PT sessions for vestibular therapy. Patient denied any other needs at this time.

## 2023-10-21 NOTE — Telephone Encounter (Signed)
-----   Message from Lonie Peak sent at 10/17/2023  4:56 PM EST ----- Hi! Can you let pt know (no rush) that I spoke w/ Radiologist and got further confirmation that nothing was grossly abnormal on her MRI to explain her vertigo? Hopefully ENT can see her soon.    Thanks, Maralyn Sago

## 2023-10-24 ENCOUNTER — Ambulatory Visit
Admission: RE | Admit: 2023-10-24 | Discharge: 2023-10-24 | Disposition: A | Discharge status: Home / Self Care | Payer: Managed Care, Other (non HMO) | Source: Ambulatory Visit | Attending: Radiation Oncology | Admitting: Radiation Oncology

## 2023-10-24 ENCOUNTER — Encounter: Payer: Self-pay | Admitting: Rehabilitation

## 2023-10-24 ENCOUNTER — Other Ambulatory Visit: Payer: Self-pay

## 2023-10-24 ENCOUNTER — Ambulatory Visit: Payer: Managed Care, Other (non HMO)

## 2023-10-24 ENCOUNTER — Inpatient Hospital Stay (HOSPITAL_BASED_OUTPATIENT_CLINIC_OR_DEPARTMENT_OTHER): Payer: Managed Care, Other (non HMO) | Admitting: Physician Assistant

## 2023-10-24 VITALS — BP 114/85 | HR 91 | Temp 97.5°F | Resp 16 | Wt 143.6 lb

## 2023-10-24 DIAGNOSIS — H8110 Benign paroxysmal vertigo, unspecified ear: Secondary | ICD-10-CM

## 2023-10-24 DIAGNOSIS — Z171 Estrogen receptor negative status [ER-]: Secondary | ICD-10-CM | POA: Diagnosis not present

## 2023-10-24 DIAGNOSIS — C50311 Malignant neoplasm of lower-inner quadrant of right female breast: Secondary | ICD-10-CM

## 2023-10-24 LAB — RAD ONC ARIA SESSION SUMMARY
Course Elapsed Days: 47
Plan Fractions Treated to Date: 3
Plan Prescribed Dose Per Fraction: 1.8 Gy
Plan Total Fractions Prescribed: 5
Plan Total Prescribed Dose: 9 Gy
Reference Point Dosage Given to Date: 5.4 Gy
Reference Point Session Dosage Given: 1.8 Gy
Session Number: 31

## 2023-10-24 NOTE — Progress Notes (Signed)
Symptom Management Consult Note [facility]    Patient Care Team: Serena Croissant, MD as PCP - General (Hematology and Oncology) Corky Crafts, MD as PCP - Cardiology (Cardiology) Duke Salvia, MD as PCP - Electrophysiology (Cardiology) Serena Croissant, MD as Consulting Physician (Hematology and Oncology) Lonie Peak, MD as Attending Physician (Radiation Oncology) [name], MD as Consulting Physician (General Surgery) Donnelly Angelica, RN as Oncology Nurse Navigator Pershing Proud, RN as Oncology Nurse Navigator    Name / MRN / [id number] Tucker  [id number]  1980-12-13   Date of visit: 10/24/2023   Chief Complaint/Reason for visit: vertigo   Current Therapy: maintenance Keytruda q21d x 27 weeks  and capecitabine as well as adjuvant radiation  Last treatment:  Day 1   Cycle 5 on 10/07/23   ASSESSMENT & PLAN: Patient is a 43 y.o. female with oncologic history of malignant neoplasm of lower-inner quadrant of right breast of female, estrogen receptor negativefollowed by Dr. [name].  I have viewed most recent oncology note and lab work.    #Malignant neoplasm of lower-inner quadrant of right breast of female, estrogen receptor negative  - Final radiation treatment is this week.. - Next appointment with oncologist is 10/28/23   #Dizziness -HPI suggestive of 9 paroxysmal positional vertigo. -MRI brain 10/17/23 without abnormality to explain her vertigo - No signs of infection in the inner or outer ear. ENT appointment at the end of February for further evaluation and likely hearing test. - Proceed with vestibular physical therapy starting tomorrow.  Will hold off on additional steroids at this time. -Patient scheduled for ENT at the end of February.  If no improvement in symptoms with vestibular physical therapy I will attempt to move up her ENT appointment to sooner.   Strict ED precautions discussed should symptoms worsen.   Heme/Onc  History: Oncology History  Malignant neoplasm of lower-inner quadrant of right breast of female, estrogen receptor negative (HCC)  01/10/2023 Initial Diagnosis   Palpable lump right axilla x 2 weeks bulky axillary lymphadenopathy ultrasound breast: 2 masses at 4 o'clock position 1.1 cm and 0.6 cm multiple axillary lymph nodes largest 4.5 cm also level 2 and 3 (subpectoral lymph node as well): Biopsy: Grade 3 IDC ER 0%, PR 0%, Ki-67 95%, HER2 1+   01/26/2023 Genetic Testing   Negative [employer] Custom Panel (37 genes).  Report date is 01/26/2023.    The [employer] Custom Cancers + RNA Panel includes sequencing, deletion/duplication, and RNA analysis of the following 37 genes: APC, ATM, AXIN2, BARD1, BMPR1A, BRCA1, BRCA2, BRIP1, CDH1, CDK4*, CDKN2A (p14ARF)*, CDKN2A (p16INK4a), CHEK2, CTNNA1, DICER1, EPCAM*, FH, GREM1*, HOXB13*, MBD4*, MLH1, MSH2, MSH3, MSH6, MUTYH, NF1, NTHL1, PALB2, PMS2, POLD1, POLE, PTEN, RAD51C, RAD51D, SMAD4, SMARCA4, STK11, TP53.  *Genes without RNA analysis.    01/28/2023 - 06/27/2023 Chemotherapy   Patient is on Treatment Plan : BREAST Pembrolizumab (200) D1 + Carboplatin (1.5) D1,8,15 + Paclitaxel (80) D1,8,15 q21d X 4 cycles / Pembrolizumab (200) D1 + AC D1 q21d x 4 cycles     07/15/2023 -  Chemotherapy   Patient is on Treatment Plan : BREAST Pembrolizumab (200) q21d x 27 weeks         Interval history-: Discussed the use of AI scribe software for clinical note transcription with the patient, who gave verbal consent to proceed.   Suzanne Tucker is a 43 y.o. female with oncologic history as above presenting to [facility] today with chief complaint of dizziness.  Patient presents unaccompanied to visit today.   Patient presents with a complaint of vertigo and ear pain. The vertigo, which started approximately 3.5 weeks ago. It was triggered by lying down and turning to the left, lasting for about thirty seconds. The patient also reported experiencing soreness in the outer  ear x 2 days. There was no swelling, and the pain has nearly resolved. An MRI of her brain was completed on 10/12/23 and results were normal. She has been scheduled to follow up with ENT on 11/24/23. Patient is currently undergoing radiation, started 09/08/2023 and her last treatment is this week 10/25/2023. Patient reports vertigo symptoms are present when she is lying flat and turns her head to the left. Symptom lasts less than 30 seconds. This has been happening during almost every   Last week she referred by medical oncologist to vestibular therapy and her first appointment is scheduled for tomorrow.  The patient had a similar episode of vertigo in August, which was resolved with steroids. A recent course of steroids slightly improved the symptoms but did not completely alleviate them. The patient also reported a history of ear infections carrying into adulthood, but never with associated vertigo. The patient had been using Flonase for sinus issues and is unable to tell if it is helping.  The patient also reported a slight hearing loss, which was present before the cancer diagnosis. 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 The patient denied any significant hearing loss or constant ringing in the ears. She denies any fevers or recent URI symptoms.    ROS  All other systems are reviewed and are negative for acute change except as noted in the HPI.    Allergies  Allergen Reactions   Morphine Anaphylaxis and Shortness Of Breath   Tape Other (See Comments)    NEEDS DRESSINGS FOR SENSITIVE SKIN!!!   Tessalon [Benzonatate] Hives, Itching and Other (See Comments)    Hives, itching, burning all over.    Hydrocodone Hives   Latex Rash   Antihistamines, Loratadine-Type Other (See Comments)    Patient reports numbness of the tongue   Codeine Nausea And Vomiting   Kiwi Extract Swelling and Other (See Comments)    Mouth tingles, too   Oxycontin [Oxycodone] Other  (See Comments)    RPh has recommended this not be taken  (Oxycontin)   Tramadol Swelling and Other (See Comments)    Tingling in the mouth with swelling      Past Medical History:  Diagnosis Date   Anginal pain (HCC) 02/2019   related to MVP   Back pain    Breast cancer (HCC)    Depression    Dysrhythmia    MVP   Mitral prolapse    Sleep apnea    mild no cpap indicated     Past Surgical History:  Procedure Laterality Date   BREAST SURGERY Right 2024   Breast biopsy x 2   CESAREAN SECTION  03/30/2008   MASTECTOMY WITH AXILLARY LYMPH NODE DISSECTION Bilateral 07/27/2023   Procedure: BILATERAL MASTECTOMY WITH RIGHT AXILLARY LYMPH NODE DISSECTION;  Surgeon: [name], MD;  Location: [facility];  Service: General;  Laterality: Bilateral;  PEC BLOCK   PORTACATH PLACEMENT N/A 01/27/2023   Procedure: INSERTION PORT-A-CATH;  Surgeon: [name], MD;  Location: [facility];  Service: General;  Laterality: N/A;  leaving accessed   ROBOTIC ASSISTED TOTAL HYSTERECTOMY Bilateral 03/15/2019   Procedure: XI ROBOTIC ASSISTED TOTAL HYSTERECTOMY with Bilateral  Salpingectomy;  Surgeon: Silverio Lay, MD;  Location: [facility];  Service: Gynecology;  Laterality: Bilateral;   TUBAL LIGATION  2017    Social History   Socioeconomic History   Marital status: Married    Spouse name: Not on file   Number of children: Not on file   Years of education: Not on file   Highest education level: Master's degree (e.g., MA, MS, MEng, MEd, MSW, MBA)  Occupational History   Not on file  Tobacco Use   Smoking status: Never   Smokeless tobacco: Never  Vaping Use   Vaping status: Never Used  Substance and Sexual Activity   Alcohol use: Not Currently    Comment:     Drug use: No   Sexual activity: Yes    Birth control/protection: Surgical  Other Topics Concern   Not on file  Social History Narrative   Not on file   Social Drivers of Health   Financial Resource  Strain: Low Risk  (12/27/2022)   Overall Financial Resource Strain (CARDIA)    Difficulty of Paying Living Expenses: Not very hard  Food Insecurity: No Food Insecurity (08/24/2023)   Hunger Vital Sign    Worried About Running Out of Food in the Last Year: Never true    Ran Out of Food in the Last Year: Never true  Transportation Needs: No Transportation Needs (08/24/2023)   PRAPARE - Administrator, Civil Service (Medical): No    Lack of Transportation (Non-Medical): No  Physical Activity: Insufficiently Active (12/27/2022)   Exercise Vital Sign    Days of Exercise per Week: 4 days    Minutes of Exercise per Session: 20 min  Stress: No Stress Concern Present (12/27/2022)   [employer] of Occupational Health - Occupational Stress Questionnaire    Feeling of Stress : Only a little  Social Connections: Moderately Isolated (12/27/2022)   Social Connection and Isolation Panel [NHANES]    Frequency of Communication with Friends and Family: More than three times a week    Frequency of Social Gatherings with Friends and Family: Once a week    Attends Religious Services: Never    Database administrator or Organizations: No    Attends Engineer, structural: Not on file    Marital Status: Married  Catering manager Violence: Not At Risk (08/24/2023)   Humiliation, Afraid, Rape, and Kick questionnaire    Fear of Current or Ex-Partner: No    Emotionally Abused: No    Physically Abused: No    Sexually Abused: No    Family History  Problem Relation Age of Onset   Stroke Mother    Alzheimer's disease Father    Diabetes Maternal Grandmother    Heart disease Maternal Grandmother    Heart attack Maternal Grandmother    Diabetes Maternal Grandfather    Heart disease Maternal Grandfather    Alzheimer's disease Paternal Grandmother    Dementia Paternal Grandmother    Alzheimer's disease Paternal Grandfather    Dementia Paternal Grandfather    Alcohol abuse Paternal  Grandfather      Current Outpatient Medications:    acetaminophen (TYLENOL) 500 MG tablet, Take 1,000 mg by mouth every 6 (six) hours as needed for fever or mild pain (pain score 1-3)., Disp: , Rfl:    calcium carbonate (TUMS - DOSED IN MG ELEMENTAL CALCIUM) 500 MG chewable tablet, Chew 1,000 mg by mouth as needed for indigestion or heartburn., Disp: , Rfl:    capecitabine (XELODA)  500 MG tablet, TAKE 2 TABLETS BY MOUTH EVERY 12 HOURS ONLY ON DAYS OF RADATION (MONDAY - FRIDAY). TAKE WITHIN 30 MINUTES OF A MEAL., Disp: 132 tablet, Rfl: 0   diclofenac Sodium (VOLTAREN) 1 % GEL, Research Patient: Apply 0.5 grams (1 fingertip) to each hand and each foot twice daily for up to 12 weeks, Disp: 400 g, Rfl: 0   fluticasone (FLONASE) 50 MCG/ACT nasal spray, Place 2 sprays into both nostrils daily. (Patient taking differently: Place 2 sprays into both nostrils daily as needed for allergies or rhinitis.), Disp: 16 g, Rfl: 0   Ibuprofen 200 MG CAPS, Take 200-400 mg by mouth every 6 (six) hours as needed (for mild pain or fever)., Disp: , Rfl:    lidocaine-prilocaine (EMLA) cream, Apply to affected area once, Disp: 30 g, Rfl: 3   methylPREDNISolone (MEDROL DOSEPAK) 4 MG TBPK tablet, Take as directed, Disp: 21 tablet, Rfl: 0   Multiple Vitamin (MULTIVITAMIN) tablet, Take 1 tablet by mouth daily., Disp: , Rfl:    ondansetron (ZOFRAN) 8 MG tablet, Take 1 tablet (8 mg total) by mouth every 8 (eight) hours as needed for nausea or vomiting., Disp: 30 tablet, Rfl: 2   prochlorperazine (COMPAZINE) 10 MG tablet, Take 1 tablet (10 mg total) by mouth every 6 (six) hours as needed for nausea or vomiting., Disp: 30 tablet, Rfl: 2  PHYSICAL EXAM: ECOG FS:1 - Symptomatic but completely ambulatory    Vitals:   10/24/23 1322  BP: 114/85  Pulse: 91  Resp: 16  Temp: (!) 97.5 F (36.4 C)  TempSrc: Temporal  SpO2: 100%  Weight: 143 lb 9.6 oz (65.1 kg)   Physical Exam Vitals and nursing note reviewed.  Constitutional:       Appearance: She is not ill-appearing or toxic-appearing.  HENT:     Head: Normocephalic.     Right Ear: Tympanic membrane and external ear normal. There is no impacted cerumen.     Left Ear: Tympanic membrane and external ear normal. There is no impacted cerumen.  Eyes:     Conjunctiva/sclera: Conjunctivae normal.  Cardiovascular:     Rate and Rhythm: Normal rate and regular rhythm.     Pulses: Normal pulses.     Heart sounds: Normal heart sounds.  Pulmonary:     Effort: Pulmonary effort is normal.     Breath sounds: Normal breath sounds.  Abdominal:     General: There is no distension.  Musculoskeletal:     Cervical back: Normal range of motion.  Skin:    General: Skin is warm and dry.  Neurological:     Mental Status: She is alert.     Comments: Gait normal.        LABORATORY DATA: I have reviewed the data as listed    Latest Ref Rng & Units 10/07/2023   10:22 AM 09/16/2023   10:26 AM 09/07/2023    8:54 AM  CBC  WBC 4.0 - 10.5 K/uL 3.1  3.3  4.8   Hemoglobin 12.0 - 15.0 g/dL 21.3  9.9  9.6   Hematocrit 36.0 - 46.0 % 32.4  30.0  30.2   Platelets 150 - 400 K/uL 197  228  251         Latest Ref Rng & Units 10/07/2023   10:22 AM 09/16/2023   10:26 AM 09/07/2023    8:54 AM  CMP  Glucose 70 - 99 mg/dL 94  90  94   BUN 6 - 20 mg/dL 13  10  7   Creatinine 0.44 - 1.00 mg/dL 4.01  0.27  2.53   Sodium 135 - 145 mmol/L 138  138  141   Potassium 3.5 - 5.1 mmol/L 3.7  4.0  4.0   Chloride 98 - 111 mmol/L 105  103  107   CO2 22 - 32 mmol/L 26  29  30    Calcium 8.9 - 10.3 mg/dL 9.2  9.8  9.6   Total Protein 6.5 - 8.1 g/dL 6.8  7.3  6.8   Total Bilirubin 0.0 - 1.2 mg/dL 0.5  0.4  0.3   Alkaline Phos 38 - 126 U/L 61  68  76   AST 15 - 41 U/L 13  14  15    ALT 0 - 44 U/L 8  7  8         RADIOGRAPHIC STUDIES (from last 24 hours if applicable) I have personally reviewed the radiological images as listed and agreed with the findings in the report. No results found.       Visit Diagnosis: 1. Benign paroxysmal positional vertigo, unspecified laterality   2. Malignant neoplasm of lower-inner quadrant of right breast of female, estrogen receptor negative (HCC)      No orders of the defined types were placed in this encounter.   All questions were answered. The patient knows to call the clinic with any problems, questions or concerns. No barriers to learning was detected.  A total of more than 30 minutes were spent on this encounter with face-to-face time and non-face-to-face time, including preparing to see the patient, counseling the patient and coordination of care as outlined above.    Thank you for allowing me to participate in the care of this patient.    Shanon Ace, PA-C Department of Hematology/Oncology [facility] Phone: [phone]  [phone]    10/24/2023 3:31 PM

## 2023-10-25 ENCOUNTER — Ambulatory Visit: Payer: Managed Care, Other (non HMO)

## 2023-10-25 ENCOUNTER — Encounter: Payer: Self-pay | Admitting: Rehabilitative and Restorative Service Providers"

## 2023-10-25 ENCOUNTER — Other Ambulatory Visit: Payer: Self-pay

## 2023-10-25 ENCOUNTER — Ambulatory Visit
Payer: Managed Care, Other (non HMO) | Attending: Adult Health | Admitting: Rehabilitative and Restorative Service Providers"

## 2023-10-25 ENCOUNTER — Ambulatory Visit
Admission: RE | Admit: 2023-10-25 | Discharge: 2023-10-25 | Disposition: A | Discharge status: Home / Self Care | Payer: Managed Care, Other (non HMO) | Source: Ambulatory Visit | Attending: Radiation Oncology | Admitting: Radiation Oncology

## 2023-10-25 DIAGNOSIS — M25612 Stiffness of left shoulder, not elsewhere classified: Secondary | ICD-10-CM | POA: Diagnosis present

## 2023-10-25 DIAGNOSIS — R42 Dizziness and giddiness: Secondary | ICD-10-CM | POA: Insufficient documentation

## 2023-10-25 DIAGNOSIS — Z483 Aftercare following surgery for neoplasm: Secondary | ICD-10-CM | POA: Insufficient documentation

## 2023-10-25 DIAGNOSIS — Z171 Estrogen receptor negative status [ER-]: Secondary | ICD-10-CM | POA: Insufficient documentation

## 2023-10-25 DIAGNOSIS — M25611 Stiffness of right shoulder, not elsewhere classified: Secondary | ICD-10-CM | POA: Insufficient documentation

## 2023-10-25 DIAGNOSIS — H8110 Benign paroxysmal vertigo, unspecified ear: Secondary | ICD-10-CM | POA: Insufficient documentation

## 2023-10-25 DIAGNOSIS — R252 Cramp and spasm: Secondary | ICD-10-CM | POA: Diagnosis present

## 2023-10-25 DIAGNOSIS — C50311 Malignant neoplasm of lower-inner quadrant of right female breast: Secondary | ICD-10-CM | POA: Insufficient documentation

## 2023-10-25 DIAGNOSIS — R293 Abnormal posture: Secondary | ICD-10-CM | POA: Insufficient documentation

## 2023-10-25 LAB — RAD ONC ARIA SESSION SUMMARY
Course Elapsed Days: 48
Plan Fractions Treated to Date: 4
Plan Prescribed Dose Per Fraction: 1.8 Gy
Plan Total Fractions Prescribed: 5
Plan Total Prescribed Dose: 9 Gy
Reference Point Dosage Given to Date: 7.2 Gy
Reference Point Session Dosage Given: 1.8 Gy
Session Number: 32

## 2023-10-25 NOTE — Therapy (Addendum)
 OUTPATIENT PHYSICAL THERAPY VESTIBULAR EVALUATION AND LATE DISCHARGE SUMMARY     Patient Name: Suzanne Tucker MRN: [id number] DOB:[birth year], 43 y.o., female Today's Date: 10/25/2023  END OF SESSION:  PT End of Session - 10/25/23 0853     Visit Number 1    Date for PT Re-Evaluation 12/16/23    Authorization Type [employer]    PT Start Time [phone]    PT Stop Time 0925    PT Time Calculation (min) 42 min    Activity Tolerance Patient tolerated treatment well    Behavior During Therapy [facility] for tasks assessed/performed             Past Medical History:  Diagnosis Date   Anginal pain (HCC) 02/2019   related to MVP   Back pain    Breast cancer (HCC)    Depression    Dysrhythmia    MVP   Mitral prolapse    Sleep apnea    mild no cpap indicated   Past Surgical History:  Procedure Laterality Date   BREAST SURGERY Right 2024   Breast biopsy x 2   CESAREAN SECTION  03/30/2008   MASTECTOMY WITH AXILLARY LYMPH NODE DISSECTION Bilateral 07/27/2023   Procedure: BILATERAL MASTECTOMY WITH RIGHT AXILLARY LYMPH NODE DISSECTION;  Surgeon: [name], MD;  Location: [facility];  Service: General;  Laterality: Bilateral;  PEC BLOCK   PORTACATH PLACEMENT N/A 01/27/2023   Procedure: INSERTION PORT-A-CATH;  Surgeon: [name], MD;  Location: [facility];  Service: General;  Laterality: N/A;  leaving accessed   ROBOTIC ASSISTED TOTAL HYSTERECTOMY Bilateral 03/15/2019   Procedure: XI ROBOTIC ASSISTED TOTAL HYSTERECTOMY with Bilateral Salpingectomy;  Surgeon: Silverio Lay, MD;  Location: [facility];  Service: Gynecology;  Laterality: Bilateral;   TUBAL LIGATION  2017   Patient Active Problem List   Diagnosis Date Noted   Complication, postoperative infection 08/10/2023   Breast cancer of lower-inner quadrant of right female breast (HCC) 07/27/2023   Dysautonomia (HCC) 07/25/2023   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) 05/03/2023    Antineoplastic chemotherapy induced pancytopenia (HCC) 05/02/2023   Genetic testing 02/08/2023   Port-A-Cath in place 01/28/2023   Malignant neoplasm of lower-inner quadrant of right breast of female, estrogen receptor negative (HCC) 01/17/2023   Axillary mass, right 12/28/2022   Palpitations 11/23/2022   Dizzy spells 03/04/2022   Carpal tunnel syndrome on right 11/02/2017   MVP (mitral valve prolapse) 10/28/2017    PCP: Maxwell Caul, MD  REFERRING PROVIDER: Loa Socks, NP  REFERRING DIAG: C50.311,Z17.1 (ICD-10-CM) - Malignant neoplasm of lower-inner quadrant of right breast of female, estrogen receptor negative (HCC) H81.10 (ICD-10-CM) - Benign paroxysmal positional vertigo, unspecified laterality  THERAPY DIAG:  Dizziness and giddiness  Malignant neoplasm of lower-inner quadrant of right breast of female, estrogen receptor negative (HCC)  Aftercare following surgery for neoplasm  Abnormal posture  Stiffness of left shoulder, not elsewhere classified  Stiffness of right shoulder, not elsewhere classified  Cramp and spasm  ONSET DATE: August 2024, but got better after the steroids.  Returned 3 weeks ago.  Rationale for Evaluation and Treatment: Rehabilitation  SUBJECTIVE:   SUBJECTIVE STATEMENT: Patient began having dizziness.  Underwent a brain MRI to ensure no metastasis or other cause to her dizziness, with MRI being unremarkable.  Patient has a consultation scheduled with a ENT at the end of February.  Patient states that she initially had dizziness in August 2024, but the steroids helped.  States that this current bought of  dizziness started about 3 weeks ago, but not as severe as last time.  Pt accompanied by: self  PERTINENT HISTORY:  Patient was diagnosed on 01/07/2023 with right grade 3 invasive ductal carcinoma breast cancer. 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 She had a bilateral mastectomy and right axillary lymph node dissection (8 of 20  nodes positive) on 07/27/2023.   PAIN:  Are you having pain? Yes: NPRS scale: 0-3/10 shoulder pain, 6/10 cervical pain Pain location: right shoulder and cervical Pain description: burn pain from Radiation Aggravating factors: Radiation Relieving factors: lotion for radiation  PRECAUTIONS: Other: Bilateral mastectomy, Right UE lymphedema risk  RED FLAGS: None   WEIGHT BEARING RESTRICTIONS: No  FALLS: Has patient fallen in last 6 months? No  LIVING ENVIRONMENT: Lives with: lives with their family Lives in: House/apartment Stairs:  2 level home Has following equipment at home: None  PLOF: Independent, Vocation/Vocational requirements: Education officer, museum, and Leisure: going to [employer], gardening, caring for Koi/frog pond  PATIENT GOALS: To no longer have vertigo.  OBJECTIVE:  Note: Objective measures were completed at Evaluation unless otherwise noted.  DIAGNOSTIC FINDINGS:  Brain MRI on 10/12/2023: IMPRESSION: Unremarkable MRI of the brain. No evidence of intracranial metastatic disease.  COGNITION: Overall cognitive status: Within functional limits for tasks assessed   SENSATION: Denies numbness and tingling  POSTURE:  No Significant postural limitations  Cervical ROM:    WFL  STRENGTH:  Eval:  WFL   GAIT: Patient reports when she first gets up in the morning, she has some instability.  FUNCTIONAL TESTS:  MCTSIB: Condition 1: Avg of 3 trials: 30 sec, Condition 2: Avg of 3 trials: 30 sec, Condition 3: Avg of 3 trials: 30 sec, Condition 4: Avg of 3 trials: 30 sec, and Total Score: 120/120  PATIENT SURVEYS:  Eval:  ABC scale 1560 / 1600 = 97.5 %  VESTIBULAR ASSESSMENT:   SYMPTOM BEHAVIOR:  Subjective history: Patient states that she had dizziness in August, but it improved, now it is coming back some.  Non-Vestibular symptoms: neck pain  Type of dizziness: Spinning/Vertigo  Frequency: daily  Duration: short lasting once position changed  Aggravating  factors: Induced by position change: rolling to the left and Induced by motion: looking up at the ceiling  Relieving factors: head stationary and slow movements  Progression of symptoms: better  OCULOMOTOR EXAM:  Ocular Alignment: normal  Ocular ROM: No Limitations  Spontaneous Nystagmus: absent  Gaze-Induced Nystagmus: absent  Smooth Pursuits: intact  Saccades: intact   VESTIBULAR - OCULAR REFLEX:   Head Thrust: intact   POSITIONAL TESTING: Right Dix-Hallpike: no nystagmus Left Dix-Hallpike: 1 minute of nystagmus noted    OTHOSTATICS: not done                                                                                                                            TODAY'S TREATMENT   DATE: 10/25/2023 Canalith Repositioning:  Epley Left: Number of Reps: 2  and Response to Treatment: symptoms resolved  Habituation:  Brandt-Daroff: comment: provided education for HEP.   PATIENT EDUCATION: Education details: Issued HEP Person educated: Patient Education method: Explanation, Facilities manager, and Handouts Education comprehension: verbalized understanding  HOME EXERCISE PROGRAM: Access Code: [email or web address] URL: [email or web address] Date: 10/25/2023 Prepared by: Reather Laurence  Exercises - Brandt-Daroff Vestibular Exercise  - 1 x daily - 7 x weekly - 5 reps - Self-Epley Maneuver Left Ear  - 1 x daily - 7 x weekly - 1-2 reps  GOALS: Goals reviewed with patient? Yes  SHORT TERM GOALS: Target date: 11/11/2023  Patient will be independent with initial HEP. Baseline: Goal status: INITIAL  2.  Patient will report a 30% improvement in dizziness since starting PT. Baseline:  Goal status: INITIAL   LONG TERM GOALS: Target date: 12/16/2023  Patient will be independent with advanced HEP to allow for self progression after discharge. Baseline:  Goal status: INITIAL  2.  Patient will have negative Gilberto Better by discharge visit. Baseline:  Goal status:  INITIAL  3.  Patient to report ability to perform all desired tasks without increased dizziness. Baseline:  Goal status: INITIAL  4.  Patient to report ability to lie down in bed without dizziness. Baseline:  Goal status: INITIAL   ASSESSMENT:  CLINICAL IMPRESSION: Patient is a 43 y.o. female who was seen today for physical therapy evaluation and treatment for BPPV. Patient states that she has been having difficulty in bed rolling over onto her left side and that she has been having dizziness when she lying down for her radiation treatment.  Patient is known to this clinic for recent treatment of her shoulders secondary to her cancer treatment, but she reports that she is feeling better from that and has been doing her exercises.  Patient presents with positive left side [employer].  Proceeded to perform canalith repositioning with the Epley Maneuver x2.  On second maneuver, patient did not have any dizziness reported.  Patient would benefit from skilled PT to assist with her dizziness and allow her to perform tasks without increased dizziness.  OBJECTIVE IMPAIRMENTS: dizziness and pain.   ACTIVITY LIMITATIONS: bed mobility  PARTICIPATION LIMITATIONS: yard work  PERSONAL FACTORS: Past/current experiences and 1 comorbidity: Breast Cancer s/p bilateral mastectomy  are also affecting patient's functional outcome.   REHAB POTENTIAL: Good  CLINICAL DECISION MAKING: Stable/uncomplicated  EVALUATION COMPLEXITY: Low   PLAN:  PT FREQUENCY: 1x/week  PT DURATION: 8 weeks  PLANNED INTERVENTIONS: 97164- PT Re-evaluation, 97110-Therapeutic exercises, 97530- Therapeutic activity, [id number]- Neuromuscular re-education, 97535- Self Care, 16109- Manual therapy, [id number]- Gait training, [phone]- Canalith repositioning, [id number]- Aquatic Therapy, 97014- Electrical stimulation (unattended), [id number]- Electrical stimulation (manual), [id number]- Ultrasound, 09811- Traction (mechanical), [id number]- Ionotophoresis 4mg /ml  Dexamethasone, Patient/Family education, Balance training, Stair training, Taping, Dry Needling, Joint mobilization, Joint manipulation, Spinal manipulation, Spinal mobilization, Vestibular training, Cryotherapy, and Moist heat  PLAN FOR NEXT SESSION: Assess and progress HEP as indicated, strengthening, vestibular rehab/balance, canalith repositioning as indicated, manual/dry needling as indicated    Reather Laurence, PT, DPT 10/25/23, 8:54 AM  [facility] [address], [address] [place], [place] [address] Phone # [phone] Fax [phone]     PHYSICAL THERAPY DISCHARGE SUMMARY  EVAL ONLY  Patient agrees to discharge. Patient goals were  Met per patient . Patient is being discharged due to being pleased with the current functional level.  On 11/27/2023, patient sent PT a message on MyChart to state that she is feeling better and no longer having dizziness.  Patient discharged from Vestibular Rehab secondary to meeting goals, per her report, and no longer requiring PT.  Clydie Braun Taiquan Campanaro, PT, DPT 11/28/23, 7:52 AM

## 2023-10-26 ENCOUNTER — Encounter: Payer: Self-pay | Admitting: Hematology and Oncology

## 2023-10-26 ENCOUNTER — Other Ambulatory Visit: Payer: Self-pay | Admitting: Hematology and Oncology

## 2023-10-26 ENCOUNTER — Ambulatory Visit
Admission: RE | Admit: 2023-10-26 | Discharge: 2023-10-26 | Disposition: A | Discharge status: Home / Self Care | Payer: Managed Care, Other (non HMO) | Source: Ambulatory Visit | Attending: Radiation Oncology

## 2023-10-26 ENCOUNTER — Other Ambulatory Visit: Payer: Self-pay

## 2023-10-26 DIAGNOSIS — C50311 Malignant neoplasm of lower-inner quadrant of right female breast: Secondary | ICD-10-CM | POA: Diagnosis not present

## 2023-10-26 LAB — RAD ONC ARIA SESSION SUMMARY
Course Elapsed Days: 49
Plan Fractions Treated to Date: 5
Plan Prescribed Dose Per Fraction: 1.8 Gy
Plan Total Fractions Prescribed: 5
Plan Total Prescribed Dose: 9 Gy
Reference Point Dosage Given to Date: 9 Gy
Reference Point Session Dosage Given: 1.8 Gy
Session Number: 33

## 2023-10-26 MED ORDER — CAPECITABINE 500 MG PO TABS: ORAL_TABLET | ORAL | 0 refills | Status: DC

## 2023-10-27 ENCOUNTER — Other Ambulatory Visit: Payer: Self-pay

## 2023-10-27 NOTE — Radiation Completion Notes (Signed)
Patient Name: Suzanne Tucker, Suzanne Tucker MRN: [id number] Date of Birth: May 07, 1981 Referring Physician: Serena Croissant, M.D. Date of Service: 2023-10-27 Radiation Oncologist: Lonie Peak, M.D. [facility]                             RADIATION ONCOLOGY END OF TREATMENT NOTE     Diagnosis: C50.311 Malignant neoplasm of lower-inner quadrant of right female breast Staging on 2023-01-19: Malignant neoplasm of lower-inner quadrant of right breast of female, estrogen receptor negative (HCC) T=cT2, N=cN3c, M=cM0 Intent: Curative     ==========DELIVERED PLANS==========  First Treatment Date: 2023-09-07 Last Treatment Date: 2023-10-26   Plan Name: CW_R_BO Site: Chest Wall, Right Technique: 3D Mode: Photon Dose Per Fraction: 1.8 Gy Prescribed Dose (Delivered / Prescribed): 25.2 Gy / 25.2 Gy Prescribed Fxs (Delivered / Prescribed): 14 / 14   Plan Name: CW_R_Neck_Ax Site: Chest Wall, Right Technique: 3D Mode: Photon Dose Per Fraction: 1.8 Gy Prescribed Dose (Delivered / Prescribed): 50.4 Gy / 50.4 Gy Prescribed Fxs (Delivered / Prescribed): 28 / 28   Plan Name: CW_R_Bst Site: Chest Wall, Right Technique: 3D Mode: Photon Dose Per Fraction: 1.8 Gy Prescribed Dose (Delivered / Prescribed): 9 Gy / 9 Gy Prescribed Fxs (Delivered / Prescribed): 5 / 5   Plan Name: CW_R Site: Chest Wall, Right Technique: 3D Mode: Photon Dose Per Fraction: 1.8 Gy Prescribed Dose (Delivered / Prescribed): 25.2 Gy / 25.2 Gy Prescribed Fxs (Delivered / Prescribed): 14 / 14     ==========ON TREATMENT VISIT DATES========== 2023-09-12, 2023-09-19, 2023-09-26, 2023-09-30, 2023-10-10, 2023-10-17, 2023-10-25     ==========UPCOMING VISITS==========       ==========APPENDIX - ON TREATMENT VISIT NOTES==========   See weekly On Treatment Notes in Epic for details in the Media tab (listed as Progress notes on the On Treatment Visit Dates listed above).

## 2023-10-28 ENCOUNTER — Encounter: Payer: Self-pay | Admitting: Adult Health

## 2023-10-28 ENCOUNTER — Inpatient Hospital Stay: Payer: Managed Care, Other (non HMO)

## 2023-10-28 ENCOUNTER — Inpatient Hospital Stay (HOSPITAL_BASED_OUTPATIENT_CLINIC_OR_DEPARTMENT_OTHER): Payer: Managed Care, Other (non HMO) | Admitting: Adult Health

## 2023-10-28 ENCOUNTER — Encounter: Payer: Self-pay | Admitting: *Deleted

## 2023-10-28 VITALS — BP 104/70 | HR 88 | Temp 98.4°F | Resp 16

## 2023-10-28 VITALS — BP 107/70 | HR 97 | Temp 99.5°F | Resp 18 | Ht 61.0 in | Wt 143.2 lb

## 2023-10-28 DIAGNOSIS — Z171 Estrogen receptor negative status [ER-]: Secondary | ICD-10-CM

## 2023-10-28 DIAGNOSIS — Z95828 Presence of other vascular implants and grafts: Secondary | ICD-10-CM

## 2023-10-28 DIAGNOSIS — C50311 Malignant neoplasm of lower-inner quadrant of right female breast: Secondary | ICD-10-CM | POA: Diagnosis not present

## 2023-10-28 LAB — CBC WITH DIFFERENTIAL (CANCER CENTER ONLY)
Abs Immature Granulocytes: 0.01 10*3/uL (ref 0.00–0.07)
Basophils Absolute: 0 10*3/uL (ref 0.0–0.1)
Basophils Relative: 0 %
Eosinophils Absolute: 0.2 10*3/uL (ref 0.0–0.5)
Eosinophils Relative: 7 %
HCT: 30.5 % — ABNORMAL LOW (ref 36.0–46.0)
Hemoglobin: 10.4 g/dL — ABNORMAL LOW (ref 12.0–15.0)
Immature Granulocytes: 0 %
Lymphocytes Relative: 10 %
Lymphs Abs: 0.3 10*3/uL — ABNORMAL LOW (ref 0.7–4.0)
MCH: 31 pg (ref 26.0–34.0)
MCHC: 34.1 g/dL (ref 30.0–36.0)
MCV: 90.8 fL (ref 80.0–100.0)
Monocytes Absolute: 0.3 10*3/uL (ref 0.1–1.0)
Monocytes Relative: 10 %
Neutro Abs: 2.2 10*3/uL (ref 1.7–7.7)
Neutrophils Relative %: 73 %
Platelet Count: 172 10*3/uL (ref 150–400)
RBC: 3.36 MIL/uL — ABNORMAL LOW (ref 3.87–5.11)
RDW: 17.8 % — ABNORMAL HIGH (ref 11.5–15.5)
WBC Count: 3 10*3/uL — ABNORMAL LOW (ref 4.0–10.5)
nRBC: 0 % (ref 0.0–0.2)

## 2023-10-28 LAB — CMP (CANCER CENTER ONLY)
ALT: 8 U/L (ref 0–44)
AST: 15 U/L (ref 15–41)
Albumin: 4 g/dL (ref 3.5–5.0)
Alkaline Phosphatase: 62 U/L (ref 38–126)
Anion gap: 6 (ref 5–15)
BUN: 8 mg/dL (ref 6–20)
CO2: 27 mmol/L (ref 22–32)
Calcium: 9.2 mg/dL (ref 8.9–10.3)
Chloride: 105 mmol/L (ref 98–111)
Creatinine: 0.64 mg/dL (ref 0.44–1.00)
GFR, Estimated: >60 mL/min (ref >60)
Glucose, Bld: 99 mg/dL (ref 70–99)
Potassium: 3.8 mmol/L (ref 3.5–5.1)
Sodium: 138 mmol/L (ref 135–145)
Total Bilirubin: 0.5 mg/dL (ref 0.0–1.2)
Total Protein: 6.5 g/dL (ref 6.5–8.1)

## 2023-10-28 LAB — TSH: TSH: 1.552 u[IU]/mL (ref 0.350–4.500)

## 2023-10-28 MED ORDER — SODIUM CHLORIDE 0.9 % IV SOLN: Freq: Once | INTRAVENOUS | Status: AC

## 2023-10-28 MED ORDER — SODIUM CHLORIDE 0.9 % IV SOLN
200.0000 mg | Freq: Once | INTRAVENOUS | Status: AC
  Administered 2023-10-28: 200 mg via INTRAVENOUS
  Filled 2023-10-28: qty 200

## 2023-10-28 MED ORDER — SODIUM CHLORIDE 0.9% FLUSH: 10.0000 mL | Freq: Once | INTRAVENOUS | Status: DC

## 2023-10-28 MED ORDER — SODIUM CHLORIDE 0.9% FLUSH
10.0000 mL | INTRAVENOUS | Status: DC | PRN
Start: 2023-10-28 — End: 2023-10-28
  Administered 2023-10-28: 10 mL

## 2023-10-28 MED ORDER — HEPARIN SOD (PORK) LOCK FLUSH 100 UNIT/ML IV SOLN
500.0000 [IU] | Freq: Once | INTRAVENOUS | Status: AC | PRN
  Administered 2023-10-28: 500 [IU]

## 2023-10-28 MED ORDER — CAPECITABINE 500 MG PO TABS: ORAL_TABLET | ORAL | 0 refills | Status: DC

## 2023-10-28 NOTE — Progress Notes (Unsigned)
Cancer Center Cancer Follow up:    Suzanne Croissant, MD [address] [place] [address]   DIAGNOSIS: Cancer Staging  Malignant neoplasm of lower-inner quadrant of right breast of female, estrogen receptor negative (HCC) Staging form: Breast, AJCC 8th Edition - Clinical: Stage IIIC (cT2, cN3c, cM0, G3, ER-, PR-, HER2-) - Unsigned Histologic grading system: 3 grade system   SUMMARY OF ONCOLOGIC HISTORY: Oncology History  Malignant neoplasm of lower-inner quadrant of right breast of female, estrogen receptor negative (HCC)  01/10/2023 Initial Diagnosis   Palpable lump right axilla x 2 weeks bulky axillary lymphadenopathy ultrasound breast: 2 masses at 4 o'clock position 1.1 cm and 0.6 cm multiple axillary lymph nodes largest 4.5 cm also level 2 and 3 (subpectoral lymph node as well): Biopsy: Grade 3 IDC ER 0%, PR 0%, Ki-67 95%, HER2 1+   01/26/2023 Genetic Testing   Negative [employer] Custom Panel (37 genes).  Report date is 01/26/2023.    The [employer] Custom Cancers + RNA Panel includes sequencing, deletion/duplication, and RNA analysis of the following 37 genes: APC, ATM, AXIN2, BARD1, BMPR1A, BRCA1, BRCA2, BRIP1, CDH1, CDK4*, CDKN2A (p14ARF)*, CDKN2A (p16INK4a), CHEK2, CTNNA1, DICER1, EPCAM*, FH, GREM1*, HOXB13*, MBD4*, MLH1, MSH2, MSH3, MSH6, MUTYH, NF1, NTHL1, PALB2, PMS2, POLD1, POLE, PTEN, RAD51C, RAD51D, SMAD4, SMARCA4, STK11, TP53.  *Genes without RNA analysis.    01/28/2023 - 06/27/2023 Chemotherapy   Patient is on Treatment Plan : BREAST Pembrolizumab (200) D1 + Carboplatin (1.5) D1,8,15 + Paclitaxel (80) D1,8,15 q21d X 4 cycles / Pembrolizumab (200) D1 + AC D1 q21d x 4 cycles     07/15/2023 -  Chemotherapy   Patient is on Treatment Plan : BREAST Pembrolizumab (200) q21d x 27 weeks       CURRENT THERAPY:  INTERVAL HISTORY:  Discussed the use of AI scribe software for clinical note transcription with the patient, who gave verbal consent to  proceed.  Suzanne Tucker 43 y.o. female returns for    Patient Active Problem List   Diagnosis Date Noted  . Complication, postoperative infection 08/10/2023  . Breast cancer of lower-inner quadrant of right female breast (HCC) 07/27/2023  . Dysautonomia (HCC) 07/25/2023  . 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 Antineoplastic chemotherapy induced pancytopenia (HCC) 05/02/2023  . Genetic testing 02/08/2023  . Port-A-Cath in place 01/28/2023  . Malignant neoplasm of lower-inner quadrant of right breast of female, estrogen receptor negative (HCC) 01/17/2023  . Axillary mass, right 12/28/2022  . Palpitations 11/23/2022  . Dizzy spells 03/04/2022  . Carpal tunnel syndrome on right 11/02/2017  . MVP (mitral valve prolapse) 10/28/2017    is allergic to morphine; tape; tessalon [benzonatate]; hydrocodone; latex; antihistamines, loratadine-type; codeine; kiwi extract; oxycontin [oxycodone]; and tramadol.  MEDICAL HISTORY: Past Medical History:  Diagnosis Date  . Anginal pain (HCC) 02/2019   related to MVP  . Back pain   . Breast cancer (HCC)   . Depression   . Dysrhythmia    MVP  . Mitral prolapse   . Sleep apnea    mild no cpap indicated    SURGICAL HISTORY: Past Surgical History:  Procedure Laterality Date  . BREAST SURGERY Right 2024   Breast biopsy x 2  . CESAREAN SECTION  03/30/2008  . MASTECTOMY WITH AXILLARY LYMPH NODE DISSECTION Bilateral 07/27/2023   Procedure: BILATERAL MASTECTOMY WITH RIGHT AXILLARY LYMPH NODE DISSECTION;  Surgeon: [name], MD;  Location: [facility];  Service: General;  Laterality: Bilateral;  PEC BLOCK  . PORTACATH  PLACEMENT N/A 01/27/2023   Procedure: INSERTION PORT-A-CATH;  Surgeon: [name], MD;  Location: [facility];  Service: General;  Laterality: N/A;  leaving accessed  . ROBOTIC ASSISTED TOTAL HYSTERECTOMY Bilateral 03/15/2019   Procedure: XI ROBOTIC ASSISTED TOTAL HYSTERECTOMY with  Bilateral Salpingectomy;  Surgeon: Silverio Lay, MD;  Location[facility];  Service: Gynecology;  Laterality: Bilateral;  . TUBAL LIGATION  2017    SOCIAL HISTORY: Social History   Socioeconomic History  . Marital status: Married    Spouse name: Not on file  . Number of children: Not on file  . Years of education: Not on file  . Highest education level: Master's degree (e.g., MA, MS, MEng, MEd, MSW, MBA)  Occupational History  . Not on file  Tobacco Use  . Smoking status: Never  . Smokeless tobacco: Never  Vaping Use  . Vaping status: Never Used  Substance and Sexual Activity  . Alcohol use: Not Currently    Comment:    . Drug use: No  . Sexual activity: Yes    Birth control/protection: Surgical  Other Topics Concern  . Not on file  Social History Narrative  . Not on file   Social Drivers of Health   Financial Resource Strain: Low Risk  (12/27/2022)   Overall Financial Resource Strain (CARDIA)   . Difficulty of Paying Living Expenses: Not very hard  Food Insecurity: No Food Insecurity (08/24/2023)   Hunger Vital Sign   . Worried About Programme researcher, broadcasting/film/video in the Last Year: Never true   . Ran Out of Food in the Last Year: Never true  Transportation Needs: No Transportation Needs (08/24/2023)   PRAPARE - Transportation   . Lack of Transportation (Medical): No   . Lack of Transportation (Non-Medical): No  Physical Activity: Insufficiently Active (12/27/2022)   Exercise Vital Sign   . Days of Exercise per Week: 4 days   . Minutes of Exercise per Session: 20 min  Stress: No Stress Concern Present (12/27/2022)   [employer] of Occupational Health - Occupational Stress Questionnaire   . Feeling of Stress : Only a little  Social Connections: Moderately Isolated (12/27/2022)   Social Connection and Isolation Panel [NHANES]   . Frequency of Communication with Friends and Family: More than three times a week   . Frequency of Social Gatherings with  Friends and Family: Once a week   . Attends Religious Services: Never   . Active Member of Clubs or Organizations: No   . Attends Banker Meetings: Not on file   . Marital Status: Married  Catering manager Violence: Not At Risk (08/24/2023)   Humiliation, Afraid, Rape, and Kick questionnaire   . Fear of Current or Ex-Partner: No   . Emotionally Abused: No   . Physically Abused: No   . Sexually Abused: No    FAMILY HISTORY: Family History  Problem Relation Age of Onset  . Stroke Mother   . Alzheimer's disease Father   . Diabetes Maternal Grandmother   . Heart disease Maternal Grandmother   . Heart attack Maternal Grandmother   . Diabetes Maternal Grandfather   . Heart disease Maternal Grandfather   . Alzheimer's disease Paternal Grandmother   . Dementia Paternal Grandmother   . Alzheimer's disease Paternal Grandfather   . Dementia Paternal Grandfather   . Alcohol abuse Paternal Grandfather     Review of Systems - Oncology    PHYSICAL EXAMINATION    Vitals:   10/28/23 0926  BP: 107/70  Pulse: 97  Resp: 18  Temp: 99.5 F (37.5 C)  SpO2: 100%    Physical Exam  LABORATORY DATA:  CBC    Component Value Date/Time   WBC 3.0 (L) 10/28/2023 0902   WBC 10.0 08/11/2023 0353   RBC 3.36 (L) 10/28/2023 0902   HGB 10.4 (L) 10/28/2023 0902   HGB 11.6 04/08/2022 1037   HCT 30.5 (L) 10/28/2023 0902   HCT 35.8 04/08/2022 1037   PLT 172 10/28/2023 0902   PLT 244 04/08/2022 1037   MCV 90.8 10/28/2023 0902   MCV 89 04/08/2022 1037   MCH 31.0 10/28/2023 0902   MCHC 34.1 10/28/2023 0902   RDW 17.8 (H) 10/28/2023 0902   RDW 12.1 04/08/2022 1037   LYMPHSABS 0.3 (L) 10/28/2023 0902   LYMPHSABS 1.3 04/08/2022 1037   MONOABS 0.3 10/28/2023 0902   EOSABS 0.2 10/28/2023 0902   EOSABS 0.1 04/08/2022 1037   BASOSABS 0.0 10/28/2023 0902   BASOSABS 0.0 04/08/2022 1037    CMP     Component Value Date/Time   NA 138 10/07/2023 1022   NA 140 04/08/2022 1037   K  3.7 10/07/2023 1022   CL 105 10/07/2023 1022   CO2 26 10/07/2023 1022   GLUCOSE 94 10/07/2023 1022   BUN 13 10/07/2023 1022   BUN 9 04/08/2022 1037   CREATININE 0.64 10/07/2023 1022   CALCIUM 9.2 10/07/2023 1022   PROT 6.8 10/07/2023 1022   PROT 6.5 04/08/2022 1037   ALBUMIN 4.0 10/07/2023 1022   ALBUMIN 4.3 04/08/2022 1037   AST 13 (L) 10/07/2023 1022   ALT 8 10/07/2023 1022   ALKPHOS 61 10/07/2023 1022   BILITOT 0.5 10/07/2023 1022   GFRNONAA >60 10/07/2023 1022   GFRAA 132 11/04/2020 1208       PENDING LABS:   RADIOGRAPHIC STUDIES:  No results found.   PATHOLOGY:     ASSESSMENT and THERAPY PLAN:   No problem-specific Assessment & Plan notes found for this encounter.   No orders of the defined types were placed in this encounter.   All questions were answered. The patient knows to call the clinic with any problems, questions or concerns. We can certainly see the patient much sooner if necessary. This note was electronically signed. Noreene Filbert, NP 10/28/2023

## 2023-10-28 NOTE — Patient Instructions (Signed)
[facility] - A DEPT OF [facility]  Discharge Instructions: Thank you for choosing [facility] to provide your oncology and hematology care.   If you have a lab appointment with the Cancer Center, please go directly to the Cancer Center and check in at the registration area.   Wear comfortable clothing and clothing appropriate for easy access to any Portacath or PICC line.   We strive to give you quality time with your provider. You may need to reschedule your appointment if you arrive late (15 or more minutes).  Arriving late affects you and other patients whose appointments are after yours.  Also, if you miss three or more appointments without notifying the office, you may be dismissed from the clinic at the provider's discretion.      For prescription refill requests, have your pharmacy contact our office and allow 72 hours for refills to be completed.    Today you received the following chemotherapy and/or immunotherapy agent: Pembrolizumab (Keytruda)   To help prevent nausea and vomiting after your treatment, we encourage you to take your nausea medication as directed.  BELOW ARE SYMPTOMS THAT SHOULD BE REPORTED IMMEDIATELY: *FEVER GREATER THAN 100.4 F (38 C) OR HIGHER *CHILLS OR SWEATING *NAUSEA AND VOMITING THAT IS NOT CONTROLLED WITH YOUR NAUSEA MEDICATION *UNUSUAL SHORTNESS OF BREATH *UNUSUAL BRUISING OR BLEEDING *URINARY PROBLEMS (pain or burning when urinating, or frequent urination) *BOWEL PROBLEMS (unusual diarrhea, constipation, pain near the anus) TENDERNESS IN MOUTH AND THROAT WITH OR WITHOUT PRESENCE OF ULCERS (sore throat, sores in mouth, or a toothache) UNUSUAL RASH, SWELLING OR PAIN  UNUSUAL VAGINAL DISCHARGE OR ITCHING   Items with * indicate a potential emergency and should be followed up as soon as possible or go to the Emergency Department if any problems should occur.  Please show the CHEMOTHERAPY ALERT CARD or  IMMUNOTHERAPY ALERT CARD at check-in to the Emergency Department and triage nurse.  Should you have questions after your visit or need to cancel or reschedule your appointment, please contact [facility] - A DEPT OF [facility]  Dept: [phone]  and follow the prompts.  Office hours are 8:00 a.m. to 4:30 p.m. Monday - Friday. Please note that voicemails left after 4:00 p.m. may not be returned until the following business day.  We are closed weekends and major holidays. You have access to a nurse at all times for urgent questions. Please call the main number to the clinic Dept: [phone] and follow the prompts.   For any non-urgent questions, you may also contact your provider using MyChart. We now offer e-Visits for anyone 6 and older to request care online for non-urgent symptoms. For details visit [email or web address].   Also download the MyChart app! Go to the app store, search "MyChart", open the app, select , and log in with your MyChart username and password.

## 2023-10-28 NOTE — Progress Notes (Signed)
Per MD request, RN successfully faxed recent office notes to pt GYN with [facility] at [phone].

## 2023-10-29 LAB — T4: T4, Total: 9.2 ug/dL (ref 4.5–12.0)

## 2023-10-31 ENCOUNTER — Encounter: Payer: Self-pay | Admitting: Hematology and Oncology

## 2023-10-31 ENCOUNTER — Encounter: Payer: Self-pay | Admitting: Radiation Oncology

## 2023-10-31 NOTE — Assessment & Plan Note (Signed)
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%, HER2 1+  01/25/2023: PET/CT: Right breast cancer, right axilla, retropectoral and right cervical lymph nodes   Treatment plan: Neoadjuvant chemotherapy with Taxol Harrell Gave weekly x 12 followed by Adriamycin Cytoxan Keytruda followed by Rande Lawman maintenance 07/27/2023: Bilateral mastectomies: Left mastectomy: Benign, right mastectomy: No evidence of residual cancer, 8/20 lymph nodes positive with extranodal extension in 2 lymph nodes Adjuvant radiation therapy 09/08/2023-10/24/2022 -------------------------------------------------------------------------------------------------------- Treatment plan: Rande Lawman with capecitabine maintenance therapy On Keytruda and capecitabine and is tolerating medications well.  Will continue treatment without any dose modifications.  Breast Cancer Completed 33 rounds of radiation therapy. Currently on capecitabine with minimal side effects (dry hands, dry mouth). -Continue capecitabine as prescribed--to begin a 14 day cycle when she receives the medication.  -Three more infusions scheduled after today's session. -Port removal to be scheduled with Dr. Donell Beers after completion of infusions.  Vertigo Resolved after one physical therapy session. -Attend one more physical therapy session for final check.  General Health Maintenance -Continue monitoring for any signs of infection or illness. -Report any fever or other concerning symptoms promptly.

## 2023-11-01 ENCOUNTER — Other Ambulatory Visit (HOSPITAL_COMMUNITY): Payer: Self-pay

## 2023-11-01 ENCOUNTER — Other Ambulatory Visit: Payer: Self-pay

## 2023-11-01 ENCOUNTER — Other Ambulatory Visit: Payer: Self-pay | Admitting: Adult Health

## 2023-11-01 DIAGNOSIS — C50311 Malignant neoplasm of lower-inner quadrant of right female breast: Secondary | ICD-10-CM

## 2023-11-01 MED ORDER — CAPECITABINE 500 MG PO TABS: ORAL_TABLET | ORAL | 1 refills | Status: DC

## 2023-11-02 ENCOUNTER — Other Ambulatory Visit: Payer: Self-pay

## 2023-11-02 ENCOUNTER — Inpatient Hospital Stay: Payer: Managed Care, Other (non HMO) | Admitting: Pharmacist

## 2023-11-02 DIAGNOSIS — C50311 Malignant neoplasm of lower-inner quadrant of right female breast: Secondary | ICD-10-CM

## 2023-11-02 NOTE — Progress Notes (Signed)
[facility]       Telephone: [phone]?Fax: [phone]   Oncology Clinical Pharmacist Practitioner Progress Note  Suzanne Tucker is a 43 y.o. female with a diagnosis of breast cancer currently on capecitabine + pembrolizumab under the care of Dr. Serena Croissant.   I connected with Suzanne Tucker today by telephone and verified that I was speaking with the correct person using two patient identifiers. I discussed the limitations, risks, security and privacy concerns of performing an evaluation and management service by telemedicine and the availability of in-person appointments. The patient/caregiver expressed understanding and agreed to proceed.  Other persons participating in the visit and their role in the encounter: none   Patient's location: home  Provider's location: clinic  Suzanne Tucker was contacted today by clinical pharmacy to review her capecitabine prescription. She finished radiation last Wednesday and Dr. [name] would now like her to transition to capecitabine dosing of 2 tablets (1000 mg) every 12 hours for 14 days, followed by a 7 day rest period. This prescription was sent by Lillard Anes NP on 11/01/23 and Suzanne Tucker states the medication should be arriving Friday (11/04/23) from [facility]. She will start the capecitabine once she receives it.   She has a follow up appointment with Lillard Anes NP on 11/18/23 with labs. She will also be do for pembrolizumab that day.   Suzanne Tucker participated in the discussion, expressed understanding, and voiced agreement with the above plan. All questions were answered to her satisfaction. The patient was advised to contact the clinic at [phone] with any questions or concerns prior to her return visit.  Clinical pharmacy will continue to support Suzanne Tucker and Dr. Serena Croissant as needed.  Suzanne Tucker A. Odetta Pink, PharmD, BCOP, CPP  Anselm Lis,  RPH-CPP,  11/02/2023  10:39 AM   **Disclaimer: This note was dictated with voice recognition software. Similar sounding words can inadvertently be transcribed and this note may contain transcription errors which may not have been corrected upon publication of note.**

## 2023-11-04 ENCOUNTER — Encounter: Payer: Self-pay | Admitting: Hematology and Oncology

## 2023-11-06 ENCOUNTER — Encounter: Payer: Self-pay | Admitting: Hematology and Oncology

## 2023-11-10 ENCOUNTER — Encounter: Payer: Self-pay | Admitting: *Deleted

## 2023-11-11 ENCOUNTER — Ambulatory Visit: Payer: Self-pay | Admitting: Rehabilitative and Restorative Service Providers"

## 2023-11-14 ENCOUNTER — Telehealth: Payer: Self-pay | Admitting: Rehabilitative and Restorative Service Providers"

## 2023-11-14 NOTE — Telephone Encounter (Signed)
 Patient cancelled next 2 PT sessions via MyChart without reason listed.  This PT performed a chart review and based on last MD note, it appears that the dizziness has resolved.  Left message with patient to please call back the office to update if she is feeling better and would like to cancel all appointments or if she feels that she needs to return.

## 2023-11-17 NOTE — Progress Notes (Signed)
  Suzanne Tucker presents today for a telephone follow-up after completing radiation to her right breast on 10/26/2023. Pain: Patient had severe 7 out of 10 pain on the surgical site during last night that was intermittent. She also has nerve pain on that site. Skin: Skin discoloration is healing up and color is starting to return to normal. Encouraged patient to use a thick lotion with vitamin E oil. ROM: No issues with range of motion, she has started to return to her normal activities. Lymphedema: Patient denies any swelling MedOnc F/U: Follow up with Dr. [name] on Thursday December 08 2023. Other issues of note:  None Pt reports Yes No Comments  Tamoxifen []  []    Letrozole []  []    Anastrazole []  []    Mammogram [x]  Date: March 2024 []      Patient is recovering really well post surgery, her only complaints is the intermittent sharp pain on right breast. Encouraged patient to go her appointments with medical oncology and any future mammograms.

## 2023-11-18 ENCOUNTER — Inpatient Hospital Stay: Payer: Managed Care, Other (non HMO) | Admitting: Adult Health

## 2023-11-18 ENCOUNTER — Inpatient Hospital Stay: Payer: Managed Care, Other (non HMO) | Attending: Hematology and Oncology

## 2023-11-18 ENCOUNTER — Other Ambulatory Visit: Payer: Self-pay | Admitting: Adult Health

## 2023-11-18 ENCOUNTER — Inpatient Hospital Stay: Payer: Managed Care, Other (non HMO)

## 2023-11-18 VITALS — BP 121/77 | HR 92 | Temp 98.7°F | Resp 19 | Ht 61.0 in | Wt 139.2 lb

## 2023-11-18 DIAGNOSIS — C50311 Malignant neoplasm of lower-inner quadrant of right female breast: Secondary | ICD-10-CM

## 2023-11-18 DIAGNOSIS — Z79899 Other long term (current) drug therapy: Secondary | ICD-10-CM | POA: Diagnosis not present

## 2023-11-18 DIAGNOSIS — Z171 Estrogen receptor negative status [ER-]: Secondary | ICD-10-CM | POA: Insufficient documentation

## 2023-11-18 DIAGNOSIS — Z5112 Encounter for antineoplastic immunotherapy: Secondary | ICD-10-CM | POA: Insufficient documentation

## 2023-11-18 DIAGNOSIS — Z95828 Presence of other vascular implants and grafts: Secondary | ICD-10-CM

## 2023-11-18 LAB — CBC WITH DIFFERENTIAL (CANCER CENTER ONLY)
Abs Immature Granulocytes: 0.01 10*3/uL (ref 0.00–0.07)
Basophils Absolute: 0 10*3/uL (ref 0.0–0.1)
Basophils Relative: 0 %
Eosinophils Absolute: 0.2 10*3/uL (ref 0.0–0.5)
Eosinophils Relative: 4 %
HCT: 31.5 % — ABNORMAL LOW (ref 36.0–46.0)
Hemoglobin: 10.7 g/dL — ABNORMAL LOW (ref 12.0–15.0)
Immature Granulocytes: 0 %
Lymphocytes Relative: 12 %
Lymphs Abs: 0.4 10*3/uL — ABNORMAL LOW (ref 0.7–4.0)
MCH: 31.5 pg (ref 26.0–34.0)
MCHC: 34 g/dL (ref 30.0–36.0)
MCV: 92.6 fL (ref 80.0–100.0)
Monocytes Absolute: 0.4 10*3/uL (ref 0.1–1.0)
Monocytes Relative: 11 %
Neutro Abs: 2.6 10*3/uL (ref 1.7–7.7)
Neutrophils Relative %: 73 %
Platelet Count: 166 10*3/uL (ref 150–400)
RBC: 3.4 MIL/uL — ABNORMAL LOW (ref 3.87–5.11)
RDW: 16.4 % — ABNORMAL HIGH (ref 11.5–15.5)
WBC Count: 3.5 10*3/uL — ABNORMAL LOW (ref 4.0–10.5)
nRBC: 0 % (ref 0.0–0.2)

## 2023-11-18 LAB — CMP (CANCER CENTER ONLY)
ALT: 7 U/L (ref 0–44)
AST: 13 U/L — ABNORMAL LOW (ref 15–41)
Albumin: 4.3 g/dL (ref 3.5–5.0)
Alkaline Phosphatase: 62 U/L (ref 38–126)
Anion gap: 5 (ref 5–15)
BUN: 12 mg/dL (ref 6–20)
CO2: 28 mmol/L (ref 22–32)
Calcium: 9.3 mg/dL (ref 8.9–10.3)
Chloride: 103 mmol/L (ref 98–111)
Creatinine: 0.61 mg/dL (ref 0.44–1.00)
GFR, Estimated: >60 mL/min (ref >60)
Glucose, Bld: 88 mg/dL (ref 70–99)
Potassium: 4.2 mmol/L (ref 3.5–5.1)
Sodium: 136 mmol/L (ref 135–145)
Total Bilirubin: 0.5 mg/dL (ref 0.0–1.2)
Total Protein: 7 g/dL (ref 6.5–8.1)

## 2023-11-18 MED ORDER — HEPARIN SOD (PORK) LOCK FLUSH 100 UNIT/ML IV SOLN
500.0000 [IU] | Freq: Once | INTRAVENOUS | Status: AC | PRN
Start: 2023-11-18 — End: 2023-11-18
  Administered 2023-11-18: 500 [IU]

## 2023-11-18 MED ORDER — SODIUM CHLORIDE 0.9% FLUSH
10.0000 mL | INTRAVENOUS | Status: DC | PRN
Start: 2023-11-18 — End: 2023-11-18
  Administered 2023-11-18: 10 mL

## 2023-11-18 MED ORDER — SODIUM CHLORIDE 0.9 % IV SOLN: Freq: Once | INTRAVENOUS | Status: AC

## 2023-11-18 MED ORDER — SODIUM CHLORIDE 0.9 % IV SOLN
200.0000 mg | Freq: Once | INTRAVENOUS | Status: AC
  Administered 2023-11-18: 200 mg via INTRAVENOUS
  Filled 2023-11-18: qty 200

## 2023-11-18 MED ORDER — SODIUM CHLORIDE 0.9% FLUSH
10.0000 mL | Freq: Once | INTRAVENOUS | Status: AC
  Administered 2023-11-18: 10 mL

## 2023-11-18 NOTE — Progress Notes (Signed)
 [facility] Cancer Follow up:    Suzanne Croissant, MD [address] [place] [address]   DIAGNOSIS:  Cancer Staging  Malignant neoplasm of lower-inner quadrant of right breast of female, estrogen receptor negative (HCC) Staging form: Breast, AJCC 8th Edition - Clinical: Stage IIIC (cT2, cN3c, cM0, G3, ER-, PR-, HER2-) - Unsigned Histologic grading system: 3 grade system   SUMMARY OF ONCOLOGIC HISTORY: Oncology History  Malignant neoplasm of lower-inner quadrant of right breast of female, estrogen receptor negative (HCC)  01/10/2023 Initial Diagnosis   Palpable lump right axilla x 2 weeks bulky axillary lymphadenopathy ultrasound breast: 2 masses at 4 o'clock position 1.1 cm and 0.6 cm multiple axillary lymph nodes largest 4.5 cm also level 2 and 3 (subpectoral lymph node as well): Biopsy: Grade 3 IDC ER 0%, PR 0%, Ki-67 95%, HER2 1+   01/26/2023 Genetic Testing   Negative [employer] Custom Panel (37 genes).  Report date is 01/26/2023.    The [employer] Custom Cancers + RNA Panel includes sequencing, deletion/duplication, and RNA analysis of the following 37 genes: APC, ATM, AXIN2, BARD1, BMPR1A, BRCA1, BRCA2, BRIP1, CDH1, CDK4*, CDKN2A (p14ARF)*, CDKN2A (p16INK4a), CHEK2, CTNNA1, DICER1, EPCAM*, FH, GREM1*, HOXB13*, MBD4*, MLH1, MSH2, MSH3, MSH6, MUTYH, NF1, NTHL1, PALB2, PMS2, POLD1, POLE, PTEN, RAD51C, RAD51D, SMAD4, SMARCA4, STK11, TP53.  *Genes without RNA analysis.    01/28/2023 - 06/27/2023 Chemotherapy   Patient is on Treatment Plan : BREAST Pembrolizumab (200) D1 + Carboplatin (1.5) D1,8,15 + Paclitaxel (80) D1,8,15 q21d X 4 cycles / Pembrolizumab (200) D1 + AC D1 q21d x 4 cycles     07/15/2023 -  Chemotherapy   Patient is on Treatment Plan : BREAST Pembrolizumab (200) q21d x 27 weeks     09/07/2023 - 10/26/2023 Radiation Therapy   First Treatment Date: 2023-09-07 Last Treatment Date: 2023-10-26   Plan Name: CW_R_BO Site: Chest Wall, Right Technique:  3D Mode: Photon Dose Per Fraction: 1.8 Gy Prescribed Dose (Delivered / Prescribed): 25.2 Gy / 25.2 Gy Prescribed Fxs (Delivered / Prescribed): 14 / 14   Plan Name: CW_R_Neck_Ax Site: Chest Wall, Right Technique: 3D Mode: Photon Dose Per Fraction: 1.8 Gy Prescribed Dose (Delivered / Prescribed): 50.4 Gy / 50.4 Gy Prescribed Fxs (Delivered / Prescribed): 28 / 28   Plan Name: CW_R_Bst Site: Chest Wall, Right Technique: 3D Mode: Photon Dose Per Fraction: 1.8 Gy Prescribed Dose (Delivered / Prescribed): 9 Gy / 9 Gy Prescribed Fxs (Delivered / Prescribed): 5 / 5   Plan Name: CW_R Site: Chest Wall, Right Technique: 3D Mode: Photon Dose Per Fraction: 1.8 Gy Prescribed Dose (Delivered / Prescribed): 25.2 Gy / 25.2 Gy Prescribed Fxs (Delivered / Prescribed): 14 / 14       CURRENT THERAPY: Keytruda/Capecitabine  INTERVAL HISTORY:  Discussed the use of AI scribe software for clinical note transcription with the patient, who gave verbal consent to proceed.  Suzanne Tucker 43 y.o. female returns for f/u on treatment with Keytruda and Capecitabine.  She is tolerating treatment well.  She mentions experiencing severe back and rib pain a few days prior, which she attributes to the medication. She also mentions that she has not yet received her new prescription for capecitabine, but it is not due for another seven days. Otherwise she is doing well and has no questions or concerns.       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  MEDICAL HISTORY: Past Medical History:  Diagnosis Date   Anginal pain (HCC) 02/2019   related to MVP   Back pain    Breast cancer (HCC)    Depression    Dysrhythmia    MVP   Mitral prolapse    Sleep apnea    mild no cpap indicated    SURGICAL HISTORY: Past Surgical History:  Procedure Laterality Date   BREAST SURGERY Right 2024   Breast biopsy x 2   CESAREAN SECTION  03/30/2008   MASTECTOMY WITH AXILLARY LYMPH NODE DISSECTION Bilateral 07/27/2023   Procedure: BILATERAL MASTECTOMY WITH RIGHT AXILLARY LYMPH NODE DISSECTION;  Surgeon: [name], MD;  Location: [facility];  Service: General;  Laterality: Bilateral;  PEC BLOCK   PORTACATH PLACEMENT N/A 01/27/2023   Procedure: INSERTION PORT-A-CATH;  Surgeon: [name], MD;  Location: [facility];  Service: General;  Laterality: N/A;  leaving accessed   ROBOTIC ASSISTED TOTAL HYSTERECTOMY Bilateral 03/15/2019   Procedure: XI ROBOTIC ASSISTED TOTAL HYSTERECTOMY with Bilateral Salpingectomy;  Surgeon: Silverio Lay, MD;  Location: [facility];  Service: Gynecology;  Laterality: Bilateral;   TUBAL LIGATION  2017    SOCIAL HISTORY: Social History   Socioeconomic History   Marital status: Married    Spouse name: Not on file   Number of children: Not on file   Years of education: Not on file   Highest education level: Master's degree (e.g., MA, MS, MEng, MEd, MSW, MBA)  Occupational History   Not on file  Tobacco Use   Smoking status: Never   Smokeless tobacco: Never  Vaping Use   Vaping status: Never Used  Substance and Sexual  Activity   Alcohol use: Not Currently    Comment:     Drug use: No   Sexual activity: Yes    Birth control/protection: Surgical  Other Topics Concern   Not on file  Social History Narrative   Not on file   Social Drivers of Health   Financial Resource Strain: Low Risk  (12/27/2022)   Overall Financial Resource Strain (CARDIA)    Difficulty of Paying Living Expenses: Not very hard  Food Insecurity: No Food Insecurity (08/24/2023)   Hunger Vital Sign    Worried About Running Out of Food in the Last Year: Never true    Ran Out of Food in the Last Year: Never true  Transportation Needs: No Transportation Needs (08/24/2023)   PRAPARE - Administrator, Civil Service (Medical): No    Lack of Transportation (Non-Medical): No  Physical Activity: Insufficiently Active (12/27/2022)   Exercise Vital Sign    Days of Exercise per Week: 4 days    Minutes of Exercise per Session: 20 min  Stress: No Stress Concern Present (12/27/2022)   [employer] of Occupational Health - Occupational Stress Questionnaire    Feeling of Stress : Only a little  Social Connections: Moderately Isolated (12/27/2022)   Social Connection and Isolation Panel [NHANES]    Frequency of Communication with Friends and Family: More than three times a week    Frequency of Social Gatherings with Friends and Family: Once a week    Attends Religious Services: Never    Database administrator or Organizations: No    Attends Engineer, structural: Not on file    Marital Status: Married  Catering manager Violence: Not At Risk (08/24/2023)   Humiliation, Afraid, Rape, and Kick questionnaire    Fear of Current or Ex-Partner: No    Emotionally Abused: No    Physically Abused: No    Sexually Abused: No    FAMILY HISTORY: Family History  Problem Relation Age of Onset   Stroke Mother    Alzheimer's disease Father    Diabetes Maternal Grandmother    Heart disease Maternal Grandmother    Heart attack  Maternal Grandmother    Diabetes Maternal Grandfather    Heart disease Maternal Grandfather    Alzheimer's disease Paternal Grandmother    Dementia Paternal Grandmother    Alzheimer's disease Paternal Grandfather    Dementia Paternal Grandfather    Alcohol abuse Paternal Grandfather     Review of Systems  Constitutional:  Negative for appetite change, chills, fatigue, fever and unexpected weight change.  HENT:   Negative for hearing loss, lump/mass and trouble swallowing.   Eyes:  Negative for eye problems and icterus.  Respiratory:  Negative for chest tightness, cough and shortness of breath.   Cardiovascular:  Negative for chest pain, leg swelling and palpitations.  Gastrointestinal:  Negative for abdominal distention, abdominal pain, constipation, diarrhea, nausea and vomiting.  Endocrine: Negative for hot flashes.  Genitourinary:  Negative for difficulty urinating.   Musculoskeletal:  Negative for arthralgias.  Skin:  Negative for itching and rash.  Neurological:  Negative for dizziness, extremity weakness, headaches and numbness.  Hematological:  Negative for adenopathy. Does not bruise/bleed easily.  Psychiatric/Behavioral:  Negative for depression. The patient is not nervous/anxious.       PHYSICAL EXAMINATION    Vitals:   11/18/23 0936  BP: 121/77  Pulse: 92  Resp: 19  Temp: 98.7 F (37.1 C)  SpO2: 100%    Physical Exam Constitutional:      General: She is not in acute distress.    Appearance: Normal appearance. She is not toxic-appearing.  HENT:     Head: Normocephalic and atraumatic.     Mouth/Throat:     Mouth: Mucous membranes are moist.     Pharynx: Oropharynx is clear. No oropharyngeal exudate or posterior oropharyngeal erythema.  Eyes:     General: No scleral icterus. Cardiovascular:     Rate and Rhythm: Normal rate and regular rhythm.     Pulses: Normal pulses.     Heart sounds: Normal heart sounds.  Pulmonary:     Effort: Pulmonary effort is  normal.     Breath sounds: Normal breath sounds.  Abdominal:     General: Abdomen is flat. Bowel sounds are normal. There is no distension.     Palpations: Abdomen is soft.     Tenderness: There is no abdominal tenderness.  Musculoskeletal:        General: No swelling.     Cervical back: Neck supple.  Lymphadenopathy:     Cervical: No cervical adenopathy.  Skin:    General: Skin is warm and dry.     Findings: No rash.  Neurological:  General: No focal deficit present.     Mental Status: She is alert.  Psychiatric:        Mood and Affect: Mood normal.        Behavior: Behavior normal.     LABORATORY DATA:  CBC    Component Value Date/Time   WBC 3.5 (L) 11/18/2023 0911   WBC 10.0 08/11/2023 0353   RBC 3.40 (L) 11/18/2023 0911   HGB 10.7 (L) 11/18/2023 0911   HGB 11.6 04/08/2022 1037   HCT 31.5 (L) 11/18/2023 0911   HCT 35.8 04/08/2022 1037   PLT 166 11/18/2023 0911   PLT 244 04/08/2022 1037   MCV 92.6 11/18/2023 0911   MCV 89 04/08/2022 1037   MCH 31.5 11/18/2023 0911   MCHC 34.0 11/18/2023 0911   RDW 16.4 (H) 11/18/2023 0911   RDW 12.1 04/08/2022 1037   LYMPHSABS 0.4 (L) 11/18/2023 0911   LYMPHSABS 1.3 04/08/2022 1037   MONOABS 0.4 11/18/2023 0911   EOSABS 0.2 11/18/2023 0911   EOSABS 0.1 04/08/2022 1037   BASOSABS 0.0 11/18/2023 0911   BASOSABS 0.0 04/08/2022 1037    CMP     Component Value Date/Time   NA 136 11/18/2023 0911   NA 140 04/08/2022 1037   K 4.2 11/18/2023 0911   CL 103 11/18/2023 0911   CO2 28 11/18/2023 0911   GLUCOSE 88 11/18/2023 0911   BUN 12 11/18/2023 0911   BUN 9 04/08/2022 1037   CREATININE 0.61 11/18/2023 0911   CALCIUM 9.3 11/18/2023 0911   PROT 7.0 11/18/2023 0911   PROT 6.5 04/08/2022 1037   ALBUMIN 4.3 11/18/2023 0911   ALBUMIN 4.3 04/08/2022 1037   AST 13 (L) 11/18/2023 0911   ALT 7 11/18/2023 0911   ALKPHOS 62 11/18/2023 0911   BILITOT 0.5 11/18/2023 0911   GFRNONAA >60 11/18/2023 0911   GFRAA 132 11/04/2020 1208       ASSESSMENT and THERAPY PLAN:   Malignant neoplasm of lower-inner quadrant of right breast of female, estrogen receptor negative (HCC) 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%, HER2 1+  01/25/2023: PET/CT: Right breast cancer, right axilla, retropectoral and right cervical lymph nodes   Treatment plan: Neoadjuvant chemotherapy with Taxol Harrell Gave weekly x 12 followed by Adriamycin Cytoxan Keytruda followed by Rande Lawman maintenance 07/27/2023: Bilateral mastectomies: Left mastectomy: Benign, right mastectomy: No evidence of residual cancer, 8/20 lymph nodes positive with extranodal extension in 2 lymph nodes Adjuvant radiation therapy 09/08/2023-10/24/2022 -------------------------------------------------------------------------------------------------------- Treatment plan: Rande Lawman with capecitabine maintenance therapy On Keytruda and capecitabine and is tolerating medications well.  Will continue treatment without any dose modifications.  Cancer treatment Patient is currently on Keytruda and Capecitabine. No new issues reported with Keytruda. Patient reported severe back and rib pain, possibly related to treatment. -Continue Keytruda and Capecitabine as prescribed. -Consider pain management strategies for back and rib pain.  Follow-up and monitoring Patient is due for a screening to assess treatment progress. -Plan for potential PET scan around May 2025. -Advise patient to discuss with Dr. [name] during March appointment.    All questions were answered. The patient knows to call the clinic with any problems, questions or concerns. We can certainly see the patient much sooner if necessary.  Total encounter time:20 minutes*in  face-to-face  visit time, chart review, lab review, care coordination, order entry, and documentation of the encounter time.    Lillard Anes, NP 11/18/23 12:44 PM Medical Oncology and Hematology [facility] [address] [place], [place] [address] Tel. [phone]    Fax. [phone]  *Total Encounter Time as defined by the Centers for Medicare and Medicaid Services includes, in addition to the face-to-face time of a patient visit (documented in the note above) non-face-to-face time: obtaining and reviewing outside history, ordering and reviewing medications, tests or procedures, care coordination (communications with other health care professionals or caregivers) and documentation in the medical record.

## 2023-11-18 NOTE — Telephone Encounter (Signed)
You are seeing the pt today. Please review and refill after her visit, thanks!

## 2023-11-18 NOTE — Assessment & Plan Note (Signed)
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%, HER2 1+  01/25/2023: PET/CT: Right breast cancer, right axilla, retropectoral and right cervical lymph nodes   Treatment plan: Neoadjuvant chemotherapy with Taxol Harrell Gave weekly x 12 followed by Adriamycin Cytoxan Keytruda followed by Rande Lawman maintenance 07/27/2023: Bilateral mastectomies: Left mastectomy: Benign, right mastectomy: No evidence of residual cancer, 8/20 lymph nodes positive with extranodal extension in 2 lymph nodes Adjuvant radiation therapy 09/08/2023-10/24/2022 -------------------------------------------------------------------------------------------------------- Treatment plan: Rande Lawman with capecitabine maintenance therapy On Keytruda and capecitabine and is tolerating medications well.  Will continue treatment without any dose modifications.  Cancer treatment Patient is currently on Keytruda and Capecitabine. No new issues reported with Keytruda. Patient reported severe back and rib pain, possibly related to treatment. -Continue Keytruda and Capecitabine as prescribed. -Consider pain management strategies for back and rib pain.  Follow-up and monitoring Patient is due for a screening to assess treatment progress. -Plan for potential PET scan around May 2025. -Advise patient to discuss with Dr. [name] during March appointment.

## 2023-11-24 ENCOUNTER — Encounter: Payer: Managed Care, Other (non HMO) | Admitting: Rehabilitative and Restorative Service Providers"

## 2023-11-24 ENCOUNTER — Institutional Professional Consult (permissible substitution) (INDEPENDENT_AMBULATORY_CARE_PROVIDER_SITE_OTHER): Payer: Managed Care, Other (non HMO)

## 2023-11-24 ENCOUNTER — Inpatient Hospital Stay: Payer: Managed Care, Other (non HMO) | Admitting: Licensed Clinical Social Worker

## 2023-11-24 NOTE — Progress Notes (Signed)
 [facility] Clinical Social Work  Visual merchandiser received message from patient requesting support for her husband and daughter.  CSW called pt back to assess for needs.  Pt reports that she is doing very well but that her husband and daughter Alma Friendly) are struggling now.  Daughter is connected with [facility]. Pt is interested in groups and/or counseling for both her daughter and husband.  CSW discussed some options available and sent to pt via MyChart ([place] [facility] & [facility] for daughter;  [facility] caregiver support group and therapy options for husband).  Pt expressed gratitude and no other needs at this time.      Makenize Messman E Lacora Folmer, LCSW  Clinical Social Worker [facility]

## 2023-11-26 ENCOUNTER — Other Ambulatory Visit: Payer: Self-pay

## 2023-11-27 ENCOUNTER — Encounter: Payer: Self-pay | Admitting: Rehabilitative and Restorative Service Providers"

## 2023-11-28 ENCOUNTER — Encounter: Payer: Self-pay | Admitting: Adult Health

## 2023-11-29 ENCOUNTER — Encounter: Payer: Managed Care, Other (non HMO) | Admitting: Rehabilitative and Restorative Service Providers"

## 2023-11-30 ENCOUNTER — Telehealth: Payer: Self-pay | Admitting: Adult Health

## 2023-11-30 NOTE — Telephone Encounter (Signed)
 Rescheduled appointments per modified orders. Left VM with appointment details.

## 2023-12-01 ENCOUNTER — Ambulatory Visit
Admission: RE | Admit: 2023-12-01 | Discharge: 2023-12-01 | Disposition: A | Discharge status: Home / Self Care | Payer: Managed Care, Other (non HMO) | Source: Ambulatory Visit | Attending: Radiation Oncology | Admitting: Radiation Oncology

## 2023-12-01 DIAGNOSIS — C50311 Malignant neoplasm of lower-inner quadrant of right female breast: Secondary | ICD-10-CM

## 2023-12-02 ENCOUNTER — Other Ambulatory Visit: Payer: Self-pay | Admitting: Radiation Oncology

## 2023-12-02 DIAGNOSIS — C50311 Malignant neoplasm of lower-inner quadrant of right female breast: Secondary | ICD-10-CM

## 2023-12-02 MED ORDER — GABAPENTIN 300 MG PO CAPS: ORAL_CAPSULE | ORAL | 0 refills | Status: DC

## 2023-12-07 ENCOUNTER — Encounter: Payer: Self-pay | Admitting: *Deleted

## 2023-12-07 ENCOUNTER — Encounter: Payer: Managed Care, Other (non HMO) | Admitting: Rehabilitative and Restorative Service Providers"

## 2023-12-07 ENCOUNTER — Encounter: Payer: Self-pay | Admitting: Hematology and Oncology

## 2023-12-08 ENCOUNTER — Inpatient Hospital Stay: Payer: Managed Care, Other (non HMO)

## 2023-12-08 ENCOUNTER — Inpatient Hospital Stay: Payer: Managed Care, Other (non HMO) | Admitting: Hematology and Oncology

## 2023-12-08 ENCOUNTER — Inpatient Hospital Stay: Payer: Managed Care, Other (non HMO) | Attending: Hematology and Oncology

## 2023-12-08 VITALS — BP 122/72 | HR 88 | Temp 98.3°F | Resp 18 | Wt 139.2 lb

## 2023-12-08 DIAGNOSIS — C50311 Malignant neoplasm of lower-inner quadrant of right female breast: Secondary | ICD-10-CM

## 2023-12-08 DIAGNOSIS — Z171 Estrogen receptor negative status [ER-]: Secondary | ICD-10-CM | POA: Insufficient documentation

## 2023-12-08 DIAGNOSIS — Z5112 Encounter for antineoplastic immunotherapy: Secondary | ICD-10-CM | POA: Diagnosis present

## 2023-12-08 DIAGNOSIS — Z79899 Other long term (current) drug therapy: Secondary | ICD-10-CM | POA: Diagnosis not present

## 2023-12-08 DIAGNOSIS — Z95828 Presence of other vascular implants and grafts: Secondary | ICD-10-CM

## 2023-12-08 DIAGNOSIS — G5600 Carpal tunnel syndrome, unspecified upper limb: Secondary | ICD-10-CM | POA: Diagnosis not present

## 2023-12-08 DIAGNOSIS — Z923 Personal history of irradiation: Secondary | ICD-10-CM | POA: Diagnosis not present

## 2023-12-08 DIAGNOSIS — Z9221 Personal history of antineoplastic chemotherapy: Secondary | ICD-10-CM | POA: Insufficient documentation

## 2023-12-08 LAB — CMP (CANCER CENTER ONLY)
ALT: 7 U/L (ref 0–44)
AST: 15 U/L (ref 15–41)
Albumin: 4.2 g/dL (ref 3.5–5.0)
Alkaline Phosphatase: 67 U/L (ref 38–126)
Anion gap: 4 — ABNORMAL LOW (ref 5–15)
BUN: 9 mg/dL (ref 6–20)
CO2: 28 mmol/L (ref 22–32)
Calcium: 9 mg/dL (ref 8.9–10.3)
Chloride: 105 mmol/L (ref 98–111)
Creatinine: 0.6 mg/dL (ref 0.44–1.00)
GFR, Estimated: >60 mL/min (ref >60)
Glucose, Bld: 93 mg/dL (ref 70–99)
Potassium: 3.9 mmol/L (ref 3.5–5.1)
Sodium: 137 mmol/L (ref 135–145)
Total Bilirubin: 0.6 mg/dL (ref 0.0–1.2)
Total Protein: 6.9 g/dL (ref 6.5–8.1)

## 2023-12-08 LAB — CBC WITH DIFFERENTIAL (CANCER CENTER ONLY)
Abs Immature Granulocytes: 0.02 10*3/uL (ref 0.00–0.07)
Basophils Absolute: 0 10*3/uL (ref 0.0–0.1)
Basophils Relative: 0 %
Eosinophils Absolute: 0.1 10*3/uL (ref 0.0–0.5)
Eosinophils Relative: 4 %
HCT: 30.3 % — ABNORMAL LOW (ref 36.0–46.0)
Hemoglobin: 10.5 g/dL — ABNORMAL LOW (ref 12.0–15.0)
Immature Granulocytes: 1 %
Lymphocytes Relative: 16 %
Lymphs Abs: 0.6 10*3/uL — ABNORMAL LOW (ref 0.7–4.0)
MCH: 32.5 pg (ref 26.0–34.0)
MCHC: 34.7 g/dL (ref 30.0–36.0)
MCV: 93.8 fL (ref 80.0–100.0)
Monocytes Absolute: 0.4 10*3/uL (ref 0.1–1.0)
Monocytes Relative: 11 %
Neutro Abs: 2.5 10*3/uL (ref 1.7–7.7)
Neutrophils Relative %: 68 %
Platelet Count: 201 10*3/uL (ref 150–400)
RBC: 3.23 MIL/uL — ABNORMAL LOW (ref 3.87–5.11)
RDW: 16.1 % — ABNORMAL HIGH (ref 11.5–15.5)
WBC Count: 3.6 10*3/uL — ABNORMAL LOW (ref 4.0–10.5)
nRBC: 0 % (ref 0.0–0.2)

## 2023-12-08 MED ORDER — SODIUM CHLORIDE 0.9% FLUSH
10.0000 mL | Freq: Once | INTRAVENOUS | Status: AC
  Administered 2023-12-08: 10 mL

## 2023-12-08 MED ORDER — SODIUM CHLORIDE 0.9 % IV SOLN: Freq: Once | INTRAVENOUS | Status: AC

## 2023-12-08 MED ORDER — SODIUM CHLORIDE 0.9 % IV SOLN
200.0000 mg | Freq: Once | INTRAVENOUS | Status: AC
  Administered 2023-12-08: 200 mg via INTRAVENOUS
  Filled 2023-12-08: qty 200

## 2023-12-08 NOTE — Progress Notes (Signed)
 Patient Care Team: Serena Croissant, MD as PCP - General (Hematology and Oncology) Corky Crafts, MD as PCP - Cardiology (Cardiology) Duke Salvia, MD as PCP - Electrophysiology (Cardiology) Serena Croissant, MD as Consulting Physician (Hematology and Oncology) Lonie Peak, MD as Attending Physician (Radiation Oncology) [name], MD as Consulting Physician (General Surgery) Donnelly Angelica, RN as Oncology Nurse Navigator Pershing Proud, RN as Oncology Nurse Navigator  DIAGNOSIS:  Encounter Diagnosis  Name Primary?   Malignant neoplasm of lower-inner quadrant of right breast of female, estrogen receptor negative (HCC) Yes    SUMMARY OF ONCOLOGIC HISTORY: Oncology History  Malignant neoplasm of lower-inner quadrant of right breast of female, estrogen receptor negative (HCC)  01/10/2023 Initial Diagnosis   Palpable lump right axilla x 2 weeks bulky axillary lymphadenopathy ultrasound breast: 2 masses at 4 o'clock position 1.1 cm and 0.6 cm multiple axillary lymph nodes largest 4.5 cm also level 2 and 3 (subpectoral lymph node as well): Biopsy: Grade 3 IDC ER 0%, PR 0%, Ki-67 95%, HER2 1+   01/26/2023 Genetic Testing   Negative [employer] Custom Panel (37 genes).  Report date is 01/26/2023.    The [employer] Custom Cancers + RNA Panel includes sequencing, deletion/duplication, and RNA analysis of the following 37 genes: APC, ATM, AXIN2, BARD1, BMPR1A, BRCA1, BRCA2, BRIP1, CDH1, CDK4*, CDKN2A (p14ARF)*, CDKN2A (p16INK4a), CHEK2, CTNNA1, DICER1, EPCAM*, FH, GREM1*, HOXB13*, MBD4*, MLH1, MSH2, MSH3, MSH6, MUTYH, NF1, NTHL1, PALB2, PMS2, POLD1, POLE, PTEN, RAD51C, RAD51D, SMAD4, SMARCA4, STK11, TP53.  *Genes without RNA analysis.    01/28/2023 - 06/27/2023 Chemotherapy   Patient is on Treatment Plan : BREAST Pembrolizumab (200) D1 + Carboplatin (1.5) D1,8,15 + Paclitaxel (80) D1,8,15 q21d X 4 cycles / Pembrolizumab (200) D1 + AC D1 q21d x 4 cycles     07/15/2023 -  Chemotherapy   Patient  is on Treatment Plan : BREAST Pembrolizumab (200) q21d x 27 weeks     09/07/2023 - 10/26/2023 Radiation Therapy   First Treatment Date: 2023-09-07 Last Treatment Date: 2023-10-26   Plan Name: CW_R_BO Site: Chest Wall, Right Technique: 3D Mode: Photon Dose Per Fraction: 1.8 Gy Prescribed Dose (Delivered / Prescribed): 25.2 Gy / 25.2 Gy Prescribed Fxs (Delivered / Prescribed): 14 / 14   Plan Name: CW_R_Neck_Ax Site: Chest Wall, Right Technique: 3D Mode: Photon Dose Per Fraction: 1.8 Gy Prescribed Dose (Delivered / Prescribed): 50.4 Gy / 50.4 Gy Prescribed Fxs (Delivered / Prescribed): 28 / 28   Plan Name: CW_R_Bst Site: Chest Wall, Right Technique: 3D Mode: Photon Dose Per Fraction: 1.8 Gy Prescribed Dose (Delivered / Prescribed): 9 Gy / 9 Gy Prescribed Fxs (Delivered / Prescribed): 5 / 5   Plan Name: CW_R Site: Chest Wall, Right Technique: 3D Mode: Photon Dose Per Fraction: 1.8 Gy Prescribed Dose (Delivered / Prescribed): 25.2 Gy / 25.2 Gy Prescribed Fxs (Delivered / Prescribed): 14 / 14       CHIEF COMPLIANT: Follow-up of capecitabine and Keytruda  HISTORY OF PRESENT ILLNESS:   History of Present Illness Suzanne Tucker, a patient with a history of cancer, presents for a follow-up visit. She reports that her treatment is going well, despite a recent illness which she contracted from her child. The illness was not flu, COVID, or strep, and she recovered from it fairly quickly. She was given an antibiotic for a persistent sore throat but chose not to take it.  Suzanne Tucker also reports experiencing numbness in her thumb and a finger, which has been ongoing for about three weeks. She  also has some numbness in another finger. She does not experience pins and needles sensations but does have occasional shooting nerve pain. She is unsure if this is due to neuropathy or carpal tunnel syndrome.  Suzanne Tucker is currently taking Xeloda (capecitabine), a chemotherapy drug, and Keytruda, an immunotherapy  drug. She reports that the main side effect she experiences from the Xeloda is dry skin, which she manages by moisturizing regularly. She is due to complete her treatment with Keytruda soon.  Suzanne Tucker also mentions having a port, which is likely used for her chemotherapy infusions. She is looking forward to having it removed once her treatment is complete.     ALLERGIES:  is allergic to morphine; tape; tessalon [benzonatate]; hydrocodone; latex; antihistamines, loratadine-type; codeine; kiwi extract; oxycontin [oxycodone]; and tramadol.  MEDICATIONS:  Current Outpatient Medications  Medication Sig Dispense Refill   diclofenac Sodium (VOLTAREN) 1 % GEL Research Patient: Apply 0.5 grams (1 fingertip) to each hand and each foot twice daily for up to 12 weeks 400 g 0   lidocaine-prilocaine (EMLA) cream Apply to affected area once 30 g 3   methylPREDNISolone (MEDROL DOSEPAK) 4 MG TBPK tablet Take as directed 21 tablet 0   Multiple Vitamin (MULTIVITAMIN) tablet Take 1 tablet by mouth daily.     acetaminophen (TYLENOL) 500 MG tablet Take 1,000 mg by mouth every 6 (six) hours as needed for fever or mild pain (pain score 1-3). (Patient not taking: Reported on 12/08/2023)     calcium carbonate (TUMS - DOSED IN MG ELEMENTAL CALCIUM) 500 MG chewable tablet Chew 1,000 mg by mouth as needed for indigestion or heartburn. (Patient not taking: Reported on 12/08/2023)     capecitabine (XELODA) 500 MG tablet TAKE 2 TABLETS BY MOUTH EVERY 12 HOURS DAYS 1-14, AND OFF FOR 7 DAYS, ON A 21 DAY CYCLE (Patient not taking: Reported on 12/08/2023) 56 tablet 1   fluticasone (FLONASE) 50 MCG/ACT nasal spray Place 2 sprays into both nostrils daily. (Patient not taking: Reported on 12/08/2023) 16 g 0   ondansetron (ZOFRAN) 8 MG tablet Take 1 tablet (8 mg total) by mouth every 8 (eight) hours as needed for nausea or vomiting. (Patient not taking: Reported on 12/08/2023) 30 tablet 2   No current facility-administered medications for this  visit.    PHYSICAL EXAMINATION: ECOG PERFORMANCE STATUS: 1 - Symptomatic but completely ambulatory    LABORATORY DATA:  I have reviewed the data as listed    Latest Ref Rng & Units 12/08/2023   10:47 AM 11/18/2023    9:11 AM 10/28/2023    9:02 AM  CMP  Glucose 70 - 99 mg/dL 93  88  99   BUN 6 - 20 mg/dL 9  12  8    Creatinine 0.44 - 1.00 mg/dL 1.61  0.96  0.45   Sodium 135 - 145 mmol/L 137  136  138   Potassium 3.5 - 5.1 mmol/L 3.9  4.2  3.8   Chloride 98 - 111 mmol/L 105  103  105   CO2 22 - 32 mmol/L 28  28  27    Calcium 8.9 - 10.3 mg/dL 9.0  9.3  9.2   Total Protein 6.5 - 8.1 g/dL 6.9  7.0  6.5   Total Bilirubin 0.0 - 1.2 mg/dL 0.6  0.5  0.5   Alkaline Phos 38 - 126 U/L 67  62  62   AST 15 - 41 U/L 15  13  15    ALT 0 - 44 U/L 7  7  8     Lab Results  Component Value Date   WBC 3.6 (L) 12/08/2023   HGB 10.5 (L) 12/08/2023   HCT 30.3 (L) 12/08/2023   MCV 93.8 12/08/2023   PLT 201 12/08/2023   NEUTROABS 2.5 12/08/2023    ASSESSMENT & PLAN:  Malignant neoplasm of lower-inner quadrant of right breast of female, estrogen receptor negative (HCC) 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%, HER2 1+  01/25/2023: PET/CT: Right breast cancer, right axilla, retropectoral and right cervical lymph nodes   Treatment plan: Neoadjuvant chemotherapy with Taxol Harrell Gave weekly x 12 followed by Adriamycin Cytoxan Keytruda followed by Keytruda maintenance until 12/30/2023 07/27/2023: Bilateral mastectomies: Left mastectomy: Benign, right mastectomy: No evidence of residual cancer, 8/20 lymph nodes positive with extranodal extension in 2 lymph nodes Adjuvant radiation therapy 09/08/2023-10/24/2022 Capecitabine (to complete June 2025) with Keytruda (to  complete 12/29/2023) -------------------------------------------------------------------------------------------------------- Treatment plan: Keytruda with capecitabine maintenance therapy Capecitabine toxicities:  Keytruda toxicities: Negative  08/19/2023: PET/CT: Benign 10/17/2023: Brain MRI: Benign Back and rib pain: Plan to obtain restaging scans ------------------------------------- Assessment and Plan Assessment & Plan Breast Cancer Undergoing capecitabine and Keytruda. Completed radiation therapy. Experiencing hand-foot syndrome from capecitabine, managed with moisturizing. PET scan planned post-treatment. Gardant test planned post-chemotherapy for recurrence monitoring. - Continue capecitabine until June 2025. - Schedule PET scan three months post-treatment. - Perform Gardant test after chemotherapy completion.  Carpal Tunnel Syndrome Numbness in thumb and hand for three weeks, suggestive of carpal tunnel syndrome. No neuropathy or pinched nerve indicated. - Consider further evaluation if symptoms persist or worsen.  Thyroid Function Monitoring Thyroid function normal in January 2025. Follow-up test drawn today as precaution. - Review thyroid function test results when available.  Follow-up Last Keytruda infusion scheduled for December 29, 2023. Discussed port removal post-treatment. - Schedule port removal after last Keytruda infusion.      Orders Placed This Encounter  Procedures   NM PET Image Restag (PS) Skull Base To Thigh    Standing Status:   Future    Expected Date:   03/09/2024    Expiration Date:   12/07/2024    If indicated for the ordered procedure, I authorize the administration of a radiopharmaceutical per Radiology protocol:   Yes    Is the patient pregnant?:   No    Preferred imaging location?:   [facility]    Release to patient:   Immediate   The patient has a good understanding of the overall plan. she agrees with it. she will call with any problems that  may develop before the next visit here. Total time spent: 30 mins including face to face time and time spent for planning, charting and co-ordination of care   Tamsen Meek, MD 12/08/23

## 2023-12-08 NOTE — Assessment & Plan Note (Signed)
 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%, HER2 1+  01/25/2023: PET/CT: Right breast cancer, right axilla, retropectoral and right cervical lymph nodes   Treatment plan: Neoadjuvant chemotherapy with Taxol Harrell Gave weekly x 12 followed by Adriamycin Cytoxan Keytruda followed by Keytruda maintenance until 12/30/2023 07/27/2023: Bilateral mastectomies: Left mastectomy: Benign, right mastectomy: No evidence of residual cancer, 8/20 lymph nodes positive with extranodal extension in 2 lymph nodes Adjuvant radiation therapy 09/08/2023-10/24/2022 -------------------------------------------------------------------------------------------------------- Treatment plan: Rande Lawman with capecitabine maintenance therapy Capecitabine toxicities:  Keytruda toxicities: Negative  08/19/2023: PET/CT: Benign 10/17/2023: Brain MRI: Benign Back and rib pain: Plan to obtain restaging scans

## 2023-12-13 ENCOUNTER — Other Ambulatory Visit: Payer: Self-pay | Admitting: General Surgery

## 2023-12-13 ENCOUNTER — Encounter: Payer: Managed Care, Other (non HMO) | Admitting: Rehabilitative and Restorative Service Providers"

## 2023-12-19 ENCOUNTER — Encounter: Payer: Self-pay | Admitting: *Deleted

## 2023-12-19 DIAGNOSIS — C50311 Malignant neoplasm of lower-inner quadrant of right female breast: Secondary | ICD-10-CM

## 2023-12-21 ENCOUNTER — Other Ambulatory Visit: Payer: Self-pay | Admitting: Adult Health

## 2023-12-21 DIAGNOSIS — C50311 Malignant neoplasm of lower-inner quadrant of right female breast: Secondary | ICD-10-CM

## 2023-12-22 ENCOUNTER — Other Ambulatory Visit: Payer: Self-pay | Admitting: *Deleted

## 2023-12-22 ENCOUNTER — Other Ambulatory Visit: Payer: Self-pay

## 2023-12-22 DIAGNOSIS — Z171 Estrogen receptor negative status [ER-]: Secondary | ICD-10-CM

## 2023-12-22 NOTE — Progress Notes (Signed)
 Received mycahrt message from pt with complaint of new back pain and rib pain. Reviewed with MD and verbal orders received to obtain CT CAP and Bone Scan.  Pt notified and verbalized understanding.

## 2023-12-23 ENCOUNTER — Encounter (HOSPITAL_COMMUNITY)
Admission: RE | Admit: 2023-12-23 | Discharge: 2023-12-23 | Disposition: A | Discharge status: Home / Self Care | Source: Ambulatory Visit | Attending: Hematology and Oncology | Admitting: Hematology and Oncology

## 2023-12-23 ENCOUNTER — Ambulatory Visit (HOSPITAL_COMMUNITY)
Admission: RE | Admit: 2023-12-23 | Discharge: 2023-12-23 | Disposition: A | Discharge status: Home / Self Care | Source: Ambulatory Visit | Attending: Hematology and Oncology | Admitting: Hematology and Oncology

## 2023-12-23 DIAGNOSIS — Z171 Estrogen receptor negative status [ER-]: Secondary | ICD-10-CM | POA: Insufficient documentation

## 2023-12-23 DIAGNOSIS — C50311 Malignant neoplasm of lower-inner quadrant of right female breast: Secondary | ICD-10-CM | POA: Insufficient documentation

## 2023-12-23 MED ORDER — TECHNETIUM TC 99M MEDRONATE IV KIT
20.0000 | PACK | Freq: Once | INTRAVENOUS | Status: AC | PRN
  Administered 2023-12-23: 22 via INTRAVENOUS

## 2023-12-23 MED ORDER — IOHEXOL 300 MG/ML  SOLN
100.0000 mL | Freq: Once | INTRAMUSCULAR | Status: AC | PRN
Start: 2023-12-23 — End: 2023-12-23
  Administered 2023-12-23: 100 mL via INTRAVENOUS

## 2023-12-29 ENCOUNTER — Inpatient Hospital Stay (HOSPITAL_BASED_OUTPATIENT_CLINIC_OR_DEPARTMENT_OTHER): Payer: Managed Care, Other (non HMO) | Admitting: Hematology and Oncology

## 2023-12-29 ENCOUNTER — Encounter: Payer: Self-pay | Admitting: *Deleted

## 2023-12-29 ENCOUNTER — Inpatient Hospital Stay: Payer: Managed Care, Other (non HMO)

## 2023-12-29 VITALS — BP 112/68 | HR 98 | Temp 98.4°F | Resp 18 | Ht 61.0 in | Wt 142.2 lb

## 2023-12-29 DIAGNOSIS — C50311 Malignant neoplasm of lower-inner quadrant of right female breast: Secondary | ICD-10-CM

## 2023-12-29 DIAGNOSIS — Z171 Estrogen receptor negative status [ER-]: Secondary | ICD-10-CM | POA: Diagnosis not present

## 2023-12-29 DIAGNOSIS — Z95828 Presence of other vascular implants and grafts: Secondary | ICD-10-CM

## 2023-12-29 LAB — CBC WITH DIFFERENTIAL (CANCER CENTER ONLY)
Abs Immature Granulocytes: 0.01 10*3/uL (ref 0.00–0.07)
Basophils Absolute: 0 10*3/uL (ref 0.0–0.1)
Basophils Relative: 0 %
Eosinophils Absolute: 0.2 10*3/uL (ref 0.0–0.5)
Eosinophils Relative: 5 %
HCT: 29.2 % — ABNORMAL LOW (ref 36.0–46.0)
Hemoglobin: 10 g/dL — ABNORMAL LOW (ref 12.0–15.0)
Immature Granulocytes: 0 %
Lymphocytes Relative: 13 %
Lymphs Abs: 0.5 10*3/uL — ABNORMAL LOW (ref 0.7–4.0)
MCH: 33.8 pg (ref 26.0–34.0)
MCHC: 34.2 g/dL (ref 30.0–36.0)
MCV: 98.6 fL (ref 80.0–100.0)
Monocytes Absolute: 0.4 10*3/uL (ref 0.1–1.0)
Monocytes Relative: 10 %
Neutro Abs: 2.8 10*3/uL (ref 1.7–7.7)
Neutrophils Relative %: 72 %
Platelet Count: 211 10*3/uL (ref 150–400)
RBC: 2.96 MIL/uL — ABNORMAL LOW (ref 3.87–5.11)
RDW: 15.3 % (ref 11.5–15.5)
WBC Count: 3.9 10*3/uL — ABNORMAL LOW (ref 4.0–10.5)
nRBC: 0 % (ref 0.0–0.2)

## 2023-12-29 LAB — TSH: TSH: 1.599 u[IU]/mL (ref 0.350–4.500)

## 2023-12-29 LAB — CMP (CANCER CENTER ONLY)
ALT: 8 U/L (ref 0–44)
AST: 14 U/L — ABNORMAL LOW (ref 15–41)
Albumin: 4.2 g/dL (ref 3.5–5.0)
Alkaline Phosphatase: 75 U/L (ref 38–126)
Anion gap: 5 (ref 5–15)
BUN: 11 mg/dL (ref 6–20)
CO2: 29 mmol/L (ref 22–32)
Calcium: 9 mg/dL (ref 8.9–10.3)
Chloride: 105 mmol/L (ref 98–111)
Creatinine: 0.58 mg/dL (ref 0.44–1.00)
GFR, Estimated: >60 mL/min (ref >60)
Glucose, Bld: 108 mg/dL — ABNORMAL HIGH (ref 70–99)
Potassium: 3.5 mmol/L (ref 3.5–5.1)
Sodium: 139 mmol/L (ref 135–145)
Total Bilirubin: 0.6 mg/dL (ref 0.0–1.2)
Total Protein: 6.9 g/dL (ref 6.5–8.1)

## 2023-12-29 MED ORDER — PEMBROLIZUMAB CHEMO INJECTION 100 MG/4ML
200.0000 mg | Freq: Once | INTRAVENOUS | Status: AC
  Administered 2023-12-29: 200 mg via INTRAVENOUS
  Filled 2023-12-29: qty 200

## 2023-12-29 MED ORDER — SODIUM CHLORIDE 0.9% FLUSH
10.0000 mL | Freq: Once | INTRAVENOUS | Status: AC
  Administered 2023-12-29: 10 mL

## 2023-12-29 MED ORDER — SODIUM CHLORIDE 0.9 % IV SOLN: Freq: Once | INTRAVENOUS | Status: AC

## 2023-12-29 NOTE — Progress Notes (Signed)
 Patient Care Team: Serena Croissant, MD as PCP - General (Hematology and Oncology) Corky Crafts, MD as PCP - Cardiology (Cardiology) Duke Salvia, MD as PCP - Electrophysiology (Cardiology) Serena Croissant, MD as Consulting Physician (Hematology and Oncology) Lonie Peak, MD as Attending Physician (Radiation Oncology) [name], MD as Consulting Physician (General Surgery) Donnelly Angelica, RN as Oncology Nurse Navigator Pershing Proud, RN as Oncology Nurse Navigator  DIAGNOSIS:  Encounter Diagnosis  Name Primary?   Malignant neoplasm of lower-inner quadrant of right breast of female, estrogen receptor negative (HCC) Yes    SUMMARY OF ONCOLOGIC HISTORY: Oncology History  Malignant neoplasm of lower-inner quadrant of right breast of female, estrogen receptor negative (HCC)  01/10/2023 Initial Diagnosis   Palpable lump right axilla x 2 weeks bulky axillary lymphadenopathy ultrasound breast: 2 masses at 4 o'clock position 1.1 cm and 0.6 cm multiple axillary lymph nodes largest 4.5 cm also level 2 and 3 (subpectoral lymph node as well): Biopsy: Grade 3 IDC ER 0%, PR 0%, Ki-67 95%, HER2 1+   01/26/2023 Genetic Testing   Negative [employer] Custom Panel (37 genes).  Report date is 01/26/2023.    The [employer] Custom Cancers + RNA Panel includes sequencing, deletion/duplication, and RNA analysis of the following 37 genes: APC, ATM, AXIN2, BARD1, BMPR1A, BRCA1, BRCA2, BRIP1, CDH1, CDK4*, CDKN2A (p14ARF)*, CDKN2A (p16INK4a), CHEK2, CTNNA1, DICER1, EPCAM*, FH, GREM1*, HOXB13*, MBD4*, MLH1, MSH2, MSH3, MSH6, MUTYH, NF1, NTHL1, PALB2, PMS2, POLD1, POLE, PTEN, RAD51C, RAD51D, SMAD4, SMARCA4, STK11, TP53.  *Genes without RNA analysis.    01/28/2023 - 06/27/2023 Chemotherapy   Patient is on Treatment Plan : BREAST Pembrolizumab (200) D1 + Carboplatin (1.5) D1,8,15 + Paclitaxel (80) D1,8,15 q21d X 4 cycles / Pembrolizumab (200) D1 + AC D1 q21d x 4 cycles     07/15/2023 -  Chemotherapy   Patient  is on Treatment Plan : BREAST Pembrolizumab (200) q21d x 27 weeks     09/07/2023 - 10/26/2023 Radiation Therapy   First Treatment Date: 2023-09-07 Last Treatment Date: 2023-10-26   Plan Name: CW_R_BO Site: Chest Wall, Right Technique: 3D Mode: Photon Dose Per Fraction: 1.8 Gy Prescribed Dose (Delivered / Prescribed): 25.2 Gy / 25.2 Gy Prescribed Fxs (Delivered / Prescribed): 14 / 14   Plan Name: CW_R_Neck_Ax Site: Chest Wall, Right Technique: 3D Mode: Photon Dose Per Fraction: 1.8 Gy Prescribed Dose (Delivered / Prescribed): 50.4 Gy / 50.4 Gy Prescribed Fxs (Delivered / Prescribed): 28 / 28   Plan Name: CW_R_Bst Site: Chest Wall, Right Technique: 3D Mode: Photon Dose Per Fraction: 1.8 Gy Prescribed Dose (Delivered / Prescribed): 9 Gy / 9 Gy Prescribed Fxs (Delivered / Prescribed): 5 / 5   Plan Name: CW_R Site: Chest Wall, Right Technique: 3D Mode: Photon Dose Per Fraction: 1.8 Gy Prescribed Dose (Delivered / Prescribed): 25.2 Gy / 25.2 Gy Prescribed Fxs (Delivered / Prescribed): 14 / 14       CHIEF COMPLIANT: Follow-up on her last treatment of Keytruda  HISTORY OF PRESENT ILLNESS:  History of Present Illness The patient, with a history of cancer, presents for a follow-up after treatment. She reports seeing her recent reports and is not stressed about them. She mentions a linear finding on the rib, which she believes could be due to a minor fracture. She also mentions heavy lifting, which could potentially cause such a fracture. She has noticed some puffiness, which she attributes to radiation. She also mentions a 'tummy thing' in the duodenum, which she believes could be due to medication.  She denies any pain associated with this. She also mentions having gallstones but has not experienced any symptoms related to them. She is due to finish her Xeloda treatment in June.     ALLERGIES:  is allergic to morphine; tape; tessalon [benzonatate]; hydrocodone; latex;  antihistamines, loratadine-type; codeine; kiwi extract; oxycontin [oxycodone]; and tramadol.  MEDICATIONS:  Current Outpatient Medications  Medication Sig Dispense Refill   acetaminophen (TYLENOL) 500 MG tablet Take 1,000 mg by mouth every 6 (six) hours as needed for fever or mild pain (pain score 1-3). (Patient not taking: Reported on 12/08/2023)     calcium carbonate (TUMS - DOSED IN MG ELEMENTAL CALCIUM) 500 MG chewable tablet Chew 1,000 mg by mouth as needed for indigestion or heartburn. (Patient not taking: Reported on 12/08/2023)     capecitabine (XELODA) 500 MG tablet TAKE 2 TABLETS BY MOUTH EVERY 12 HOURS DAYS 1-14, AND OFF FOR 7 DAYS, ON A 21 DAY CYCLE 56 tablet 1   diclofenac Sodium (VOLTAREN) 1 % GEL Research Patient: Apply 0.5 grams (1 fingertip) to each hand and each foot twice daily for up to 12 weeks 400 g 0   fluticasone (FLONASE) 50 MCG/ACT nasal spray Place 2 sprays into both nostrils daily. (Patient not taking: Reported on 12/08/2023) 16 g 0   lidocaine-prilocaine (EMLA) cream Apply to affected area once 30 g 3   methylPREDNISolone (MEDROL DOSEPAK) 4 MG TBPK tablet Take as directed 21 tablet 0   Multiple Vitamin (MULTIVITAMIN) tablet Take 1 tablet by mouth daily.     ondansetron (ZOFRAN) 8 MG tablet Take 1 tablet (8 mg total) by mouth every 8 (eight) hours as needed for nausea or vomiting. (Patient not taking: Reported on 12/08/2023) 30 tablet 2   No current facility-administered medications for this visit.    PHYSICAL EXAMINATION: ECOG PERFORMANCE STATUS: 1 - Symptomatic but completely ambulatory  Vitals:   12/29/23 1338  BP: 112/68  Pulse: 98  Resp: 18  Temp: 98.4 F (36.9 C)  SpO2: 100%   Filed Weights   12/29/23 1338  Weight: 142 lb 3.2 oz (64.5 kg)    Physical Exam   (exam performed in the presence of a chaperone)  LABORATORY DATA:  I have reviewed the data as listed    Latest Ref Rng & Units 12/08/2023   10:47 AM 11/18/2023    9:11 AM 10/28/2023    9:02 AM   CMP  Glucose 70 - 99 mg/dL 93  88  99   BUN 6 - 20 mg/dL 9  12  8    Creatinine 0.44 - 1.00 mg/dL 1.02  7.25  3.66   Sodium 135 - 145 mmol/L 137  136  138   Potassium 3.5 - 5.1 mmol/L 3.9  4.2  3.8   Chloride 98 - 111 mmol/L 105  103  105   CO2 22 - 32 mmol/L 28  28  27    Calcium 8.9 - 10.3 mg/dL 9.0  9.3  9.2   Total Protein 6.5 - 8.1 g/dL 6.9  7.0  6.5   Total Bilirubin 0.0 - 1.2 mg/dL 0.6  0.5  0.5   Alkaline Phos 38 - 126 U/L 67  62  62   AST 15 - 41 U/L 15  13  15    ALT 0 - 44 U/L 7  7  8      Lab Results  Component Value Date   WBC 3.9 (L) 12/29/2023   HGB 10.0 (L) 12/29/2023   HCT 29.2 (L) 12/29/2023  MCV 98.6 12/29/2023   PLT 211 12/29/2023   NEUTROABS 2.8 12/29/2023    ASSESSMENT & PLAN:  Malignant neoplasm of lower-inner quadrant of right breast of female, estrogen receptor negative (HCC) 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%, HER2 1+  01/25/2023: PET/CT: Right breast cancer, right axilla, retropectoral and right cervical lymph nodes   Treatment plan: Neoadjuvant chemotherapy with Taxol Harrell Gave weekly x 12 followed by Adriamycin Cytoxan Keytruda followed by Keytruda maintenance until 12/30/2023 07/27/2023: Bilateral mastectomies: Left mastectomy: Benign, right mastectomy: No evidence of residual cancer, 8/20 lymph nodes positive with extranodal extension in 2 lymph nodes Adjuvant radiation therapy 09/08/2023-10/24/2022 Capecitabine (to complete June 2025) with Keytruda (to complete 12/29/2023) -------------------------------------------------------------------------------------------------------- Treatment plan: Keytruda (final Keytruda infusion is today) with capecitabine maintenance therapy Capecitabine toxicities:  Tolerating it extremely well Keytruda toxicities: Negative   08/19/2023: PET/CT: Benign 10/17/2023: Brain MRI: Benign Back and rib pain: CT CAP 12/23/2023: Right upper lobe nodularity from radiation, right mastectomy changes, duodenitis, cholelithiasis Bone scan 12/28/2023: Faint activity left posterior ribs possibly traumatic.  No bone metastasis ------------------------------------- Assessment and Plan Assessment & Plan Malignant neoplasm of lower-inner quadrant of right breast, estrogen receptor negative (HCC) In remission with no evidence of cancer on recent scans. Bone scan clear, CT scan showed expected radiation changes. Linear rib finding nonspecific, likely due to minor trauma or heavy lifting. Currently on Xeloda, treatment concludes in June. - Continue Xeloda until June. - Plan port removal based on clear scan results. - Schedule home blood test. - Schedule follow-up appointment at end of Xeloda treatment in June.  Thyroid function monitoring Active condition to be monitored.  Follow-up Follow-up appointment to conclude Xeloda treatment, then every six months and annually. - Plan subsequent follow-ups every six months and then annually.      No orders of the defined types were placed in this encounter.  The patient has a good understanding of the overall plan. she agrees with it. she will call with any problems that may develop before the next visit here. Total time spent: 30 mins including face to face time and time spent for planning, charting and co-ordination of care   Tamsen Meek, MD 12/29/23

## 2023-12-29 NOTE — Progress Notes (Signed)
 Per MD requst RN successfully faxed [facility] [employer] orders.

## 2023-12-29 NOTE — Patient Instructions (Signed)
 [facility] - A DEPT OF [facility]  Discharge Instructions: Thank you for choosing [facility] to provide your oncology and hematology care.   If you have a lab appointment with the Cancer Center, please go directly to the Cancer Center and check in at the registration area.   Wear comfortable clothing and clothing appropriate for easy access to any Portacath or PICC line.   We strive to give you quality time with your provider. You may need to reschedule your appointment if you arrive late (15 or more minutes).  Arriving late affects you and other patients whose appointments are after yours.  Also, if you miss three or more appointments without notifying the office, you may be dismissed from the clinic at the provider's discretion.      For prescription refill requests, have your pharmacy contact our office and allow 72 hours for refills to be completed.    Today you received the following chemotherapy and/or immunotherapy agents Suzanne Tucker      To help prevent nausea and vomiting after your treatment, we encourage you to take your nausea medication as directed.  BELOW ARE SYMPTOMS THAT SHOULD BE REPORTED IMMEDIATELY: *FEVER GREATER THAN 100.4 F (38 C) OR HIGHER *CHILLS OR SWEATING *NAUSEA AND VOMITING THAT IS NOT CONTROLLED WITH YOUR NAUSEA MEDICATION *UNUSUAL SHORTNESS OF BREATH *UNUSUAL BRUISING OR BLEEDING *URINARY PROBLEMS (pain or burning when urinating, or frequent urination) *BOWEL PROBLEMS (unusual diarrhea, constipation, pain near the anus) TENDERNESS IN MOUTH AND THROAT WITH OR WITHOUT PRESENCE OF ULCERS (sore throat, sores in mouth, or a toothache) UNUSUAL RASH, SWELLING OR PAIN  UNUSUAL VAGINAL DISCHARGE OR ITCHING   Items with * indicate a potential emergency and should be followed up as soon as possible or go to the Emergency Department if any problems should occur.  Please show the CHEMOTHERAPY ALERT CARD or IMMUNOTHERAPY  ALERT CARD at check-in to the Emergency Department and triage nurse.  Should you have questions after your visit or need to cancel or reschedule your appointment, please contact [facility] - A DEPT OF [facility]  Dept: [phone]  and follow the prompts.  Office hours are 8:00 a.m. to 4:30 p.m. Monday - Friday. Please note that voicemails left after 4:00 p.m. may not be returned until the following business day.  We are closed weekends and major holidays. You have access to a nurse at all times for urgent questions. Please call the main number to the clinic Dept: [phone] and follow the prompts.   For any non-urgent questions, you may also contact your provider using MyChart. We now offer e-Visits for anyone 85 and older to request care online for non-urgent symptoms. For details visit [email or web address].   Also download the MyChart app! Go to the app store, search "MyChart", open the app, select , and log in with your MyChart username and password.

## 2023-12-29 NOTE — Assessment & Plan Note (Signed)
 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%, HER2 1+  01/25/2023: PET/CT: Right breast cancer, right axilla, retropectoral and right cervical lymph nodes   Treatment plan: Neoadjuvant chemotherapy with Taxol Harrell Gave weekly x 12 followed by Adriamycin Cytoxan Keytruda followed by Keytruda maintenance until 12/30/2023 07/27/2023: Bilateral mastectomies: Left mastectomy: Benign, right mastectomy: No evidence of residual cancer, 8/20 lymph nodes positive with extranodal extension in 2 lymph nodes Adjuvant radiation therapy 09/08/2023-10/24/2022 Capecitabine (to complete June 2025) with Keytruda (to complete 12/29/2023) -------------------------------------------------------------------------------------------------------- Treatment plan: Keytruda (final Keytruda infusion is today) with capecitabine maintenance therapy Capecitabine toxicities: Tolerating it extremely well Keytruda toxicities: Negative   08/19/2023: PET/CT: Benign 10/17/2023: Brain MRI: Benign Back and rib pain: CT CAP 12/23/2023: Right upper lobe nodularity from radiation, right mastectomy changes, duodenitis, cholelithiasis Bone scan 12/28/2023: Faint activity left posterior ribs possibly traumatic.  No bone metastasis

## 2023-12-30 ENCOUNTER — Telehealth: Payer: Self-pay | Admitting: Hematology and Oncology

## 2023-12-30 ENCOUNTER — Ambulatory Visit: Payer: Managed Care, Other (non HMO) | Admitting: Adult Health

## 2023-12-30 ENCOUNTER — Other Ambulatory Visit: Payer: Managed Care, Other (non HMO)

## 2023-12-30 ENCOUNTER — Ambulatory Visit: Payer: Managed Care, Other (non HMO)

## 2023-12-30 ENCOUNTER — Other Ambulatory Visit: Payer: Self-pay | Admitting: General Surgery

## 2023-12-30 DIAGNOSIS — C50311 Malignant neoplasm of lower-inner quadrant of right female breast: Secondary | ICD-10-CM

## 2023-12-30 LAB — T4: T4, Total: 9.1 ug/dL (ref 4.5–12.0)

## 2023-12-30 NOTE — Telephone Encounter (Signed)
 Scheduled and rescheduled appointment per 3/27 los. Left VM for the patient with appointment details.

## 2023-12-30 NOTE — Research (Signed)
 TRIAL S2205, ICE COMPRESS: RANDOMIZED TRIAL OF LIMB CRYOCOMPRESSION VERSUS CONTINUOUS COMPRESSION VERSUS LOW CYCLIC COMPRESSION FOR THE PREVENTION  OF TAXANE-INDUCED PERIPHERAL NEUROPATHY   Patient arrives today Unaccompanied for the 52 week visit.    PROs: Per study protocol, all PROs required for this visit were completed prior to other study activities and completeness has been verified.     LABS: No labs due.   ADVERSE EVENTS: Patient Stayce Delancy Lesiak reports no AEs.  NEURO ASSESSMENT: The neuro assessment was completed by this nurse. Patient Fatisha Rabalais Twichell tolerated all testing without complaint.   GIFT CARD: This study does not provide visit compensation.   DISPOSITION: Upon completion off all study requirements, patient was escorted to the exit.   The patient was thanked for their time and voluntary participation in this study. This is the last visit for this protocol; patient verbalized understanding and will contact me if she has any needs.  Margret Chance Monzerrath Mcburney, RN, BSN, [facility]  Her  [facility] Clinical Research Nurse [facility] Direct Dial [phone] 12/30/2023 9:36 AM

## 2024-01-02 ENCOUNTER — Institutional Professional Consult (permissible substitution) (INDEPENDENT_AMBULATORY_CARE_PROVIDER_SITE_OTHER): Payer: Managed Care, Other (non HMO)

## 2024-01-06 ENCOUNTER — Other Ambulatory Visit: Payer: Self-pay | Admitting: *Deleted

## 2024-01-06 DIAGNOSIS — C50311 Malignant neoplasm of lower-inner quadrant of right female breast: Secondary | ICD-10-CM

## 2024-01-09 ENCOUNTER — Telehealth: Payer: Self-pay | Admitting: Pharmacy Technician

## 2024-01-09 ENCOUNTER — Other Ambulatory Visit (HOSPITAL_COMMUNITY): Payer: Self-pay

## 2024-01-09 ENCOUNTER — Encounter: Payer: Self-pay | Admitting: Hematology and Oncology

## 2024-01-09 NOTE — Telephone Encounter (Signed)
 Oral Oncology Patient Advocate Encounter   Received notification that prior authorization for capecitabine is required.   PA submitted on 01/09/24 Key [id number] Status is pending     Omer Jack, CPhT-Adv Oncology Pharmacy Patient Advocate [facility]  Direct Number: [phone]  Fax: [phone]

## 2024-01-09 NOTE — Telephone Encounter (Signed)
 Oral Oncology Patient Advocate Encounter  Prior Authorization for capecitabine has been approved.    PA# [id number] Effective dates: 01/09/24 through 01/08/25  Patients co-pay is $200.    Omer Jack, CPhT-Adv Oncology Pharmacy Patient Advocate [facility]  Direct Number: [phone]  Fax: [phone]

## 2024-01-10 ENCOUNTER — Encounter: Payer: Self-pay | Admitting: Hematology and Oncology

## 2024-01-11 ENCOUNTER — Encounter: Payer: Self-pay | Admitting: Hematology and Oncology

## 2024-01-12 ENCOUNTER — Encounter: Admitting: Adult Health

## 2024-01-13 ENCOUNTER — Inpatient Hospital Stay: Payer: Self-pay | Attending: Hematology and Oncology | Admitting: Adult Health

## 2024-01-13 ENCOUNTER — Ambulatory Visit (HOSPITAL_COMMUNITY)
Admission: RE | Admit: 2024-01-13 | Discharge: 2024-01-13 | Disposition: A | Discharge status: Home / Self Care | Source: Ambulatory Visit | Attending: Adult Health | Admitting: Adult Health

## 2024-01-13 ENCOUNTER — Encounter: Payer: Self-pay | Admitting: Adult Health

## 2024-01-13 VITALS — BP 115/68 | HR 88 | Temp 98.2°F | Resp 17 | Wt 145.0 lb

## 2024-01-13 DIAGNOSIS — M7989 Other specified soft tissue disorders: Secondary | ICD-10-CM

## 2024-01-13 DIAGNOSIS — C50311 Malignant neoplasm of lower-inner quadrant of right female breast: Secondary | ICD-10-CM | POA: Diagnosis not present

## 2024-01-13 DIAGNOSIS — Z171 Estrogen receptor negative status [ER-]: Secondary | ICD-10-CM

## 2024-01-13 NOTE — Assessment & Plan Note (Signed)
 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%, HER2 1+  01/25/2023: PET/CT: Right breast cancer, right axilla, retropectoral and right cervical lymph nodes   Treatment plan: Neoadjuvant chemotherapy with Taxol Harrell Gave weekly x 12 followed by Adriamycin Cytoxan Keytruda followed by Keytruda maintenance until 12/30/2023 07/27/2023: Bilateral mastectomies: Left mastectomy: Benign, right mastectomy: No evidence of residual cancer, 8/20 lymph nodes positive with extranodal extension in 2 lymph nodes Adjuvant radiation therapy 09/08/2023-10/24/2022 Capecitabine (to complete June 2025) with Keytruda (to complete 12/29/2023) -------------------------------------------------------------------------------------------------------- Treatment plan: capecitabine maintenance therapy  08/19/2023: PET/CT: Benign 10/17/2023: Brain MRI: Benign Back and rib pain: CT CAP 12/23/2023: Right upper lobe nodularity from radiation, right mastectomy changes, duodenitis, cholelithiasis Bone scan 12/28/2023: Faint activity left posterior ribs possibly traumatic.  No bone metastasis  Right arm swelling and tenderness Swelling and tenderness possibly due to carrying a heavy dog. Differential includes lymphedema or thrombosis. Previous lymph node removal increases lymphedema risk. Doppler ultrasound recommended to rule out thrombosis. - Order Doppler ultrasound of the right arm to rule out thrombosis. - Advise wearing a compression sleeve frequently. - Continue with physical therapy appointment on April 21. - Attempt to complete Doppler ultrasound before travel.  Post-chemotherapy musculoskeletal pain Musculoskeletal pain likely related to chemotherapy-induced genetic aging and hormonal  changes. Symptoms consistent with post-chemotherapy effects. - Reassure that symptoms are consistent with post-chemotherapy effects. - Manage symptoms with supportive care.  Port removal Eligible for port removal with no current issues at the port site. - Proceed with port removal as scheduled.  Travel to [place] Concern about complications during travel, especially related to arm swelling. Reassured that thrombosis treatment is straightforward if needed. - Provide reassurance regarding travel safety.

## 2024-01-13 NOTE — Progress Notes (Signed)
 [facility] Cancer Follow up:    Suzanne Croissant, MD [address] [place] [address]   DIAGNOSIS:  Cancer Staging  Malignant neoplasm of lower-inner quadrant of right breast of female, estrogen receptor negative (HCC) Staging form: Breast, AJCC 8th Edition - Clinical: Stage IIIC (cT2, cN3c, cM0, G3, ER-, PR-, HER2-) - Unsigned Histologic grading system: 3 grade system    SUMMARY OF ONCOLOGIC HISTORY: Oncology History  Malignant neoplasm of lower-inner quadrant of right breast of female, estrogen receptor negative (HCC)  01/10/2023 Initial Diagnosis   Palpable lump right axilla x 2 weeks bulky axillary lymphadenopathy ultrasound breast: 2 masses at 4 o'clock position 1.1 cm and 0.6 cm multiple axillary lymph nodes largest 4.5 cm also level 2 and 3 (subpectoral lymph node as well): Biopsy: Grade 3 IDC ER 0%, PR 0%, Ki-67 95%, HER2 1+   01/26/2023 Genetic Testing   Negative [employer] Custom Panel (37 genes).  Report date is 01/26/2023.    The [employer] Custom Cancers + RNA Panel includes sequencing, deletion/duplication, and RNA analysis of the following 37 genes: APC, ATM, AXIN2, BARD1, BMPR1A, BRCA1, BRCA2, BRIP1, CDH1, CDK4*, CDKN2A (p14ARF)*, CDKN2A (p16INK4a), CHEK2, CTNNA1, DICER1, EPCAM*, FH, GREM1*, HOXB13*, MBD4*, MLH1, MSH2, MSH3, MSH6, MUTYH, NF1, NTHL1, PALB2, PMS2, POLD1, POLE, PTEN, RAD51C, RAD51D, SMAD4, SMARCA4, STK11, TP53.  *Genes without RNA analysis.    01/28/2023 - 06/27/2023 Chemotherapy   Patient is on Treatment Plan : BREAST Pembrolizumab (200) D1 + Carboplatin (1.5) D1,8,15 + Paclitaxel (80) D1,8,15 q21d X 4 cycles / Pembrolizumab (200) D1 + AC D1 q21d x 4 cycles     07/15/2023 -  Chemotherapy   Patient is on Treatment Plan : BREAST Pembrolizumab (200) q21d x 27 weeks     09/07/2023 - 10/26/2023 Radiation Therapy   First Treatment Date: 2023-09-07 Last Treatment Date: 2023-10-26   Plan Name: CW_R_BO Site: Chest Wall, Right Technique:  3D Mode: Photon Dose Per Fraction: 1.8 Gy Prescribed Dose (Delivered / Prescribed): 25.2 Gy / 25.2 Gy Prescribed Fxs (Delivered / Prescribed): 14 / 14   Plan Name: CW_R_Neck_Ax Site: Chest Wall, Right Technique: 3D Mode: Photon Dose Per Fraction: 1.8 Gy Prescribed Dose (Delivered / Prescribed): 50.4 Gy / 50.4 Gy Prescribed Fxs (Delivered / Prescribed): 28 / 28   Plan Name: CW_R_Bst Site: Chest Wall, Right Technique: 3D Mode: Photon Dose Per Fraction: 1.8 Gy Prescribed Dose (Delivered / Prescribed): 9 Gy / 9 Gy Prescribed Fxs (Delivered / Prescribed): 5 / 5   Plan Name: CW_R Site: Chest Wall, Right Technique: 3D Mode: Photon Dose Per Fraction: 1.8 Gy Prescribed Dose (Delivered / Prescribed): 25.2 Gy / 25.2 Gy Prescribed Fxs (Delivered / Prescribed): 14 / 14       CURRENT THERAPY:  INTERVAL HISTORY:  Discussed the use of AI scribe software for clinical note transcription with the patient, who gave verbal consent to proceed.  Suzanne Tucker 43 y.o. female with a history of right sided breast cancer presents with right arm pain and swelling, which she attributes to carrying her 17-pound Dachshund up and down stairs for several days. The pain runs from the shoulder to the hand. She has a history of lymph gland removal on the right side. She is concerned about the possibility of a blood clot. She also reports tenderness and puffiness in the area of her port, and is scheduled for port removal. She has occasional aches and pains in her ankles and her left arm frequently falls asleep while she is sleeping. She  has been told these symptoms may be due to carpal tunnel syndrome and previous injuries.   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  MEDICAL HISTORY: Past Medical History:  Diagnosis Date   Anginal pain (HCC) 02/2019   related to MVP   Back pain    Breast cancer (HCC)    Depression    Dysrhythmia    MVP   Mitral prolapse    Sleep apnea    mild no cpap indicated    SURGICAL HISTORY: Past Surgical History:  Procedure Laterality Date   BREAST SURGERY Right 2024   Breast biopsy x 2   CESAREAN SECTION  03/30/2008   MASTECTOMY WITH AXILLARY LYMPH NODE DISSECTION Bilateral 07/27/2023   Procedure: BILATERAL MASTECTOMY WITH RIGHT AXILLARY LYMPH NODE DISSECTION;  Surgeon: [name], MD;  Location: [facility];  Service: General;  Laterality: Bilateral;  PEC BLOCK   PORTACATH PLACEMENT N/A 01/27/2023   Procedure: INSERTION PORT-A-CATH;  Surgeon: [name], MD;  Location: [facility];  Service: General;  Laterality: N/A;  leaving accessed   ROBOTIC ASSISTED TOTAL HYSTERECTOMY Bilateral 03/15/2019   Procedure: XI ROBOTIC ASSISTED TOTAL HYSTERECTOMY with Bilateral Salpingectomy;  Surgeon: Silverio Lay, MD;  Location: [facility];  Service: Gynecology;  Laterality: Bilateral;   TUBAL LIGATION  2017    SOCIAL HISTORY: Social History   Socioeconomic History   Marital status: Married    Spouse name: Not on file   Number of children: Not on file   Years of education: Not on file   Highest education level:  Master's degree (e.g., MA, MS, MEng, MEd, MSW, MBA)  Occupational History   Not on file  Tobacco Use   Smoking status: Never   Smokeless tobacco: Never  Vaping Use   Vaping status: Never Used  Substance and Sexual Activity   Alcohol use: Not Currently    Comment:     Drug use: No   Sexual activity: Yes    Birth control/protection: Surgical  Other Topics Concern   Not on file  Social History Narrative   Not on file   Social Drivers of Health   Financial Resource Strain: Low Risk  (12/27/2022)   Overall Financial Resource Strain (CARDIA)    Difficulty of Paying Living Expenses: Not very hard  Food Insecurity: No Food Insecurity (08/24/2023)   Hunger Vital Sign    Worried About Running Out of Food in the Last Year: Never true    Ran Out of Food in the Last Year: Never true  Transportation Needs: No Transportation Needs (08/24/2023)   PRAPARE - Administrator, Civil Service (Medical): No    Lack of Transportation (Non-Medical): No  Physical Activity: Insufficiently Active (12/27/2022)   Exercise Vital Sign    Days of Exercise  per Week: 4 days    Minutes of Exercise per Session: 20 min  Stress: No Stress Concern Present (12/27/2022)   [employer] of Occupational Health - Occupational Stress Questionnaire    Feeling of Stress : Only a little  Social Connections: Moderately Isolated (12/27/2022)   Social Connection and Isolation Panel [NHANES]    Frequency of Communication with Friends and Family: More than three times a week    Frequency of Social Gatherings with Friends and Family: Once a week    Attends Religious Services: Never    Database administrator or Organizations: No    Attends Engineer, structural: Not on file    Marital Status: Married  Catering manager Violence: Not At Risk (08/24/2023)   Humiliation, Afraid, Rape, and Kick questionnaire    Fear of Current or Ex-Partner: No    Emotionally Abused: No    Physically Abused: No    Sexually  Abused: No    FAMILY HISTORY: Family History  Problem Relation Age of Onset   Stroke Mother    Alzheimer's disease Father    Diabetes Maternal Grandmother    Heart disease Maternal Grandmother    Heart attack Maternal Grandmother    Diabetes Maternal Grandfather    Heart disease Maternal Grandfather    Alzheimer's disease Paternal Grandmother    Dementia Paternal Grandmother    Alzheimer's disease Paternal Grandfather    Dementia Paternal Grandfather    Alcohol abuse Paternal Grandfather     Review of Systems  Constitutional:  Negative for appetite change, chills, fatigue, fever and unexpected weight change.  HENT:   Negative for hearing loss, lump/mass and trouble swallowing.   Eyes:  Negative for eye problems and icterus.  Respiratory:  Negative for chest tightness, cough and shortness of breath.   Cardiovascular:  Negative for chest pain, leg swelling and palpitations.  Gastrointestinal:  Negative for abdominal distention, abdominal pain, constipation, diarrhea, nausea and vomiting.  Endocrine: Negative for hot flashes.  Genitourinary:  Negative for difficulty urinating.   Musculoskeletal:  Negative for arthralgias.  Skin:  Negative for itching and rash.  Neurological:  Negative for dizziness, extremity weakness, headaches and numbness.  Hematological:  Negative for adenopathy. Does not bruise/bleed easily.  Psychiatric/Behavioral:  Negative for depression. The patient is not nervous/anxious.       PHYSICAL EXAMINATION    Vitals:   01/13/24 1345  BP: 115/68  Pulse: 88  Resp: 17  Temp: 98.2 F (36.8 C)  SpO2: 100%    Physical Exam Constitutional:      General: She is not in acute distress.    Appearance: Normal appearance. She is not toxic-appearing.  HENT:     Head: Normocephalic and atraumatic.     Mouth/Throat:     Mouth: Mucous membranes are moist.     Pharynx: Oropharynx is clear. No oropharyngeal exudate or posterior oropharyngeal erythema.  Eyes:      General: No scleral icterus. Cardiovascular:     Rate and Rhythm: Normal rate and regular rhythm.     Pulses: Normal pulses.     Heart sounds: Normal heart sounds.  Pulmonary:     Effort: Pulmonary effort is normal.     Breath sounds: Normal breath sounds.  Abdominal:     General: Abdomen is flat. Bowel sounds are normal. There is no distension.     Palpations: Abdomen is soft.     Tenderness: There is no abdominal tenderness.  Musculoskeletal:  General: Swelling (+ slight swelling in right upper arm, and lower arm, no swelling in hand) present.     Cervical back: Neck supple.  Lymphadenopathy:     Cervical: No cervical adenopathy.  Skin:    General: Skin is warm and dry.     Findings: No rash.     Comments: Port is intact, no erythema or swelling noted around port side, in left subclavian area or in left upper arm.    Neurological:     General: No focal deficit present.     Mental Status: She is alert.  Psychiatric:        Mood and Affect: Mood normal.        Behavior: Behavior normal.      ASSESSMENT and THERAPY PLAN:   Malignant neoplasm of lower-inner quadrant of right breast of female, estrogen receptor negative (HCC) 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%, HER2 1+  01/25/2023: PET/CT: Right breast cancer, right axilla, retropectoral and right cervical lymph nodes   Treatment plan: Neoadjuvant chemotherapy with Taxol Harrell Gave weekly x 12 followed by Adriamycin Cytoxan Keytruda followed by Keytruda maintenance until 12/30/2023 07/27/2023: Bilateral mastectomies: Left mastectomy: Benign, right mastectomy: No evidence of residual cancer, 8/20 lymph nodes positive with extranodal extension in 2 lymph nodes Adjuvant  radiation therapy 09/08/2023-10/24/2022 Capecitabine (to complete June 2025) with Keytruda (to complete 12/29/2023) -------------------------------------------------------------------------------------------------------- Treatment plan: capecitabine maintenance therapy  08/19/2023: PET/CT: Benign 10/17/2023: Brain MRI: Benign Back and rib pain: CT CAP 12/23/2023: Right upper lobe nodularity from radiation, right mastectomy changes, duodenitis, cholelithiasis Bone scan 12/28/2023: Faint activity left posterior ribs possibly traumatic.  No bone metastasis  Right arm swelling and tenderness Swelling and tenderness possibly due to carrying a heavy dog. Differential includes lymphedema or thrombosis. Previous lymph node removal increases lymphedema risk. Doppler ultrasound recommended to rule out thrombosis. - Order Doppler ultrasound of the right arm to rule out thrombosis. - Advise wearing a compression sleeve frequently. - Continue with physical therapy appointment on April 21. - Attempt to complete Doppler ultrasound before travel.  Post-chemotherapy musculoskeletal pain Musculoskeletal pain likely related to chemotherapy-induced genetic aging and hormonal changes. Symptoms consistent with post-chemotherapy effects. - Reassure that symptoms are consistent with post-chemotherapy effects. - Manage symptoms with supportive care.  Port removal Eligible for port removal with no current issues at the port site. - Proceed with port removal as scheduled.  Travel to [place] Concern about complications during travel, especially related to arm swelling. Reassured that thrombosis treatment is straightforward if needed. - Provide reassurance regarding travel safety.     All questions were answered. The patient knows to call the clinic with any problems, questions or concerns. We can certainly see the patient much sooner if necessary.  Total encounter time:20 minutes*in face-to-face visit time, chart  review, lab review, care coordination, order entry, and documentation of the encounter time.    Lillard Anes, NP 01/13/24 2:41 PM Medical Oncology and Hematology [facility] [address] [place], [place] [address] Tel. [phone]    Fax. [phone]  *Total Encounter Time as defined by the Centers for Medicare and Medicaid Services includes, in addition to the face-to-face time of a patient visit (documented in the note above) non-face-to-face time: obtaining and reviewing  outside history, ordering and reviewing medications, tests or procedures, care coordination (communications with other health care professionals or caregivers) and documentation in the medical record.

## 2024-01-23 ENCOUNTER — Ambulatory Visit

## 2024-01-25 ENCOUNTER — Encounter (HOSPITAL_BASED_OUTPATIENT_CLINIC_OR_DEPARTMENT_OTHER): Payer: Self-pay | Admitting: General Surgery

## 2024-01-25 ENCOUNTER — Other Ambulatory Visit: Payer: Self-pay

## 2024-01-26 NOTE — Therapy (Signed)
 OUTPATIENT PHYSICAL THERAPY  UPPER EXTREMITY ONCOLOGY EVALUATION  Patient Name: Suzanne Tucker MRN: [id number] DOB:26-Sep-1981, 43 y.o., female Today's Date: 01/27/2024  END OF SESSION:  PT End of Session - 01/27/24 0853     Visit Number 1    Number of Visits 6    Date for PT Re-Evaluation 03/09/24    Authorization Type aetna    PT Start Time 0900    PT Stop Time 0950    PT Time Calculation (min) 50 min    Activity Tolerance Patient tolerated treatment well    Behavior During Therapy [facility] for tasks assessed/performed             Past Medical History:  Diagnosis Date   Anginal pain (HCC) 02/2019   related to MVP   Back pain    Breast cancer (HCC)    Depression    Dysrhythmia    MVP   Mitral prolapse    PONV (postoperative nausea and vomiting)    Sleep apnea    mild no cpap indicated   Past Surgical History:  Procedure Laterality Date   BREAST SURGERY Right 2024   Breast biopsy x 2   CESAREAN SECTION  03/30/2008   MASTECTOMY WITH AXILLARY LYMPH NODE DISSECTION Bilateral 07/27/2023   Procedure: BILATERAL MASTECTOMY WITH RIGHT AXILLARY LYMPH NODE DISSECTION;  Surgeon: Lockie Rima, MD;  Location: [facility];  Service: General;  Laterality: Bilateral;  PEC BLOCK   PORTACATH PLACEMENT N/A 01/27/2023   Procedure: INSERTION PORT-A-CATH;  Surgeon: Lockie Rima, MD;  Location: [facility];  Service: General;  Laterality: N/A;  leaving accessed   ROBOTIC ASSISTED TOTAL HYSTERECTOMY Bilateral 03/15/2019   Procedure: XI ROBOTIC ASSISTED TOTAL HYSTERECTOMY with Bilateral Salpingectomy;  Surgeon: Ona Bidding, MD;  Location: [facility];  Service: Gynecology;  Laterality: Bilateral;   TUBAL LIGATION  2017   Patient Active Problem List   Diagnosis Date Noted   Complication, postoperative infection 08/10/2023   Breast cancer of lower-inner quadrant of right female breast (HCC) 07/27/2023   Dysautonomia (HCC) 07/25/2023   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) 05/03/2023   Antineoplastic chemotherapy induced pancytopenia (HCC) 05/02/2023   Genetic testing 02/08/2023   Port-A-Cath in place 01/28/2023   Malignant neoplasm of lower-inner quadrant of right breast of female, estrogen receptor negative (HCC) 01/17/2023   Axillary mass, right 12/28/2022   Palpitations 11/23/2022   Dizzy spells 03/04/2022   Carpal tunnel syndrome on right 11/02/2017   MVP (mitral valve prolapse) 10/28/2017    PCP:   REFERRING PROVIDER: Cameron Cea  REFERRING DIAG:Right UE  Lymphedema  THERAPY DIAG:  Malignant neoplasm of lower-inner quadrant of right breast of female, estrogen receptor negative (HCC)  Stiffness of right shoulder, not elsewhere classified  Abnormal posture  ONSET DATE: 01/13/2024  Rationale for Evaluation and Treatment: Rehabilitation  SUBJECTIVE:  SUBJECTIVE STATEMENT:  I felt like my chest was puffy and then it went down. I saw Alwin Baars on 01/13/2024 and had a doppler because I was concerned about swelling in my arm. I measured my arm Wednesday and it was increased by 2 cm, and yesterday when I measured it it was fine. When I lay on the left side my arm is numb in the whole arm. I have numbness and pain in the posterior arm since the surgery. It is achy. Its very tender at the lateral breast. My joints have all been really achy, and my anterior lateral leg is numb. My dad is a Land so He has mobilized my joints. They gave me a prescription for gabapentin , but I haven't taken it.I get some shooting pain in the right chest but it is improving.  PERTINENT HISTORY:  Patient was diagnosed on 01/07/2023 with right grade 3 invasive ductal carcinoma breast cancer. 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 She had a bilateral  mastectomy and right axillary lymph node dissection (8 of 20 nodes positive) on 07/27/2023.  PAIN:  Are you having pain? Yes NPRS scale: 1/10, more tenderness than pain Pain location: back of right arm, Pain orientation: Right and Left  PAIN TYPE: aching and tight Pain description:   Aggravating factors: nothing Relieving factors: Nothing really helps, but its very mild.  PRECAUTIONS: Right lymphedema risk  RED FLAGS: None   WEIGHT BEARING RESTRICTIONS: No  FALLS:  Has patient fallen in last 6 months? No  LIVING ENVIRONMENT: Lives with: lives with their family, husband and her daughter Lives in: House/apartment   OCCUPATION: teacher  LEISURE: gardening, mini hikes, cleaning  HAND DOMINANCE: right   PRIOR LEVEL OF FUNCTION: Independent  PATIENT GOALS: Decrease swelling   OBJECTIVE: Note: Objective measures were completed at Evaluation unless otherwise noted.  COGNITION: Overall cognitive status: Within functional limits for tasks assessed   PALPATION: Thin axillary cord running into the forearm  OBSERVATIONS / OTHER ASSESSMENTS: incisions healed., No significant swelling noted. ?possible Mild swelling  inferior to right axilla. She will try wearing a sports bra.  SENSATION: Light touch: Deficits     POSTURE: forward head rounded shoulders  UPPER EXTREMITY AROM/PROM:  A/PROM RIGHT   eval   Shoulder extension   Shoulder flexion 146  Shoulder abduction 136  Shoulder internal rotation   Shoulder external rotation     (Blank rows = not tested)  A/PROM LEFT   eval  Shoulder extension   Shoulder flexion 162  Shoulder abduction 159  Shoulder internal rotation   Shoulder external rotation     (Blank rows = not tested)  CERVICAL AROM: All functional  limits:    UPPER EXTREMITY STRENGTH:   LYMPHEDEMA ASSESSMENTS:   SURGERY TYPE/DATE: 06/27/2023 Bilateral Mastectomy for triple Negative  NUMBER OF LYMPH NODES REMOVED: 8+/20  CHEMOTHERAPY:  YES  RADIATION:YES  HORMONE TREATMENT: NO  INFECTIONS: Yes, breast hospitalized 08/10/23- 08/13/2023   LYMPHEDEMA ASSESSMENTS:   LANDMARK RIGHT  eval  At axilla  32.9  15 cm proximal to olecranon process   10 cm proximal to olecranon process 28.3  Olecranon process 23.7  15 cm proximal to ulnar styloid process   10 cm proximal to ulnar styloid process 22.3  Just proximal to ulnar styloid process 15.3  Across hand at thumb web space 18.4  At base of 2nd digit 5.9  (Blank rows = not tested)  LANDMARK LEFT  eval  At axilla  31.8  15 cm  proximal to olecranon process   10 cm proximal to olecranon process 28.2  Olecranon process 23.8  15 cm proximal to ulnar styloid process   10 cm proximal to ulnar styloid process 21.8  Just proximal to ulnar styloid process 14.8  Across hand at thumb web space 17.9  At base of 2nd digit 5.7  (Blank rows = not tested)   FUNCTIONAL TESTS:    GAIT:WNL  L-DEX LYMPHEDEMA SCREENING: The patient was assessed using the L-Dex machine today to produce a lymphedema index baseline score. The patient will be reassessed on a regular basis (typically every 3 months) to obtain new L-Dex scores. If the score is > 6.5 points away from his/her baseline score indicating onset of subclinical lymphedema, it will be recommended to wear a compression garment for 4 weeks, 12 hours per day and then be reassessed. If the score continues to be > 6.5 points from baseline at reassessment, we will initiate lymphedema treatment. Assessing in this manner has a 95% rate of preventing clinically significant lymphedema.  L-DEX FLOWSHEETS - 01/27/24 0900       L-DEX LYMPHEDEMA SCREENING   Measurement Type Unilateral    L-DEX MEASUREMENT EXTREMITY Upper Extremity    POSITION  Standing    DOMINANT SIDE Right    At Risk Side Right    BASELINE SCORE (UNILATERAL) 4.6    L-DEX SCORE (UNILATERAL) 5.2    VALUE CHANGE (UNILAT) 0.6              QUICK DASH SURVEY:   Lymphedema Life Impact Scale: 9                                                                                                                            TREATMENT DATE:  01/27/2024 supine wand flexion and scaption, reviewed stargazer, wall slide, shoulder ext with wrist ext for cording al x 4-5 reps and updated HEP with pictures. Gave information on axillary cording. Discussed POC/treatment interventions. She will return May 8 for reassessment prn, or may cancel if she is doing well   PATIENT EDUCATION:  Education details: supine wand flexion and scaption, reviewed stargazer, wall slide, shoulder ext with wrist ext for cording al x 4-5 reps,cording Person educated: Patient Education method: Programmer, multimedia, Demonstration, and Handouts Education comprehension: verbalized understanding and returned demonstration  HOME EXERCISE PROGRAM: supine wand flexion and scaption, reviewed stargazer, wall slide, shoulder ext with wrist ext for cording al x 4-5 reps,  ASSESSMENT:  CLINICAL IMPRESSION: Patient is a 43 y.o. female who was seen today for physical therapy evaluation and treatment for concerns of .lymphedema and lateral breast tenderness. There was no significant difference noted with circumference measures and her SOZO screen was negative. She completed radiation in January and she has developed some limitations in right shoulder ROM, and some mild cording. These can likely be addressed with her HEP which was instructed today. She was given information on axillary cording and we reviewed and had her  perform her home exercises as noted above.  There is no significant swelling at the lateral trunk but it is possible there is a little. She will try wearing her sports bra. I was planning to make a foam pad for her to place in it but this was forgotten today. She will work on her exercises and will return on May 8 for reassessment If she requires further visits at that time she will be scheduled. If she  is doing well she was advised she may cancel.   OBJECTIVE IMPAIRMENTS: decreased knowledge of condition, decreased mobility, decreased ROM, impaired UE functional use, postural dysfunction, and pain.   ACTIVITY LIMITATIONS: sleeping and reach over head  PARTICIPATION LIMITATIONS: Doing all but mainly limited with reaching  PERSONAL FACTORS: 3+ comorbidities: Right triple Negative breast cancer s/p chemo and radiation  are also affecting patient's functional outcome.   REHAB POTENTIAL: Excellent  CLINICAL DECISION MAKING: Stable/uncomplicated  EVALUATION COMPLEXITY: Low  GOALS: Goals reviewed with patient? Yes  SHORT TERM GOALS=Long Term Goals: Target date:   Pt will be independent and compliant with a HEP for shoulder ROM, progressing to strength when ready Baseline: Goal status: INITIAL  2.  Pt will improve right shoulder flexion by 8-10 degrees for improved reaching Baseline:  Goal status: INITIAL  3.  Pt will improve right shoulder abd to 160 degrees for improved reaching ability Baseline:  Goal status: INITIAL  4.  Pt will not be bothered by axillary cording symptoms Baseline:  Goal status: INITIAL    PLAN:  PT FREQUENCY: 1x/week  PT DURATION: 6 weeks  PLANNED INTERVENTIONS: 97164- PT Re-evaluation, 97750- Physical Performance Testing, 97110-Therapeutic exercises, 97530- Therapeutic activity, 97112- Neuromuscular re-education, 97535- Self Care, 16109- Manual therapy, Patient/Family education, Joint mobilization, Therapeutic exercises, Therapeutic activity, Neuromuscular re-education, Gait training, and Self Care  PLANNED INTERVENTIONS:   PLAN FOR NEXT SESSION: pulleys,Reassess ROM, MFR for cording prn,PROM, update HEP prn  Latisha Poland, PT 01/27/2024, 12:04 PM

## 2024-01-27 ENCOUNTER — Other Ambulatory Visit: Payer: Self-pay

## 2024-01-27 ENCOUNTER — Ambulatory Visit: Attending: Hematology and Oncology

## 2024-01-27 DIAGNOSIS — M25611 Stiffness of right shoulder, not elsewhere classified: Secondary | ICD-10-CM | POA: Diagnosis present

## 2024-01-27 DIAGNOSIS — C50311 Malignant neoplasm of lower-inner quadrant of right female breast: Secondary | ICD-10-CM | POA: Insufficient documentation

## 2024-01-27 DIAGNOSIS — Z171 Estrogen receptor negative status [ER-]: Secondary | ICD-10-CM | POA: Insufficient documentation

## 2024-01-27 DIAGNOSIS — R293 Abnormal posture: Secondary | ICD-10-CM | POA: Insufficient documentation

## 2024-01-27 NOTE — Patient Instructions (Signed)
 SHOULDER: Flexion - Supine (Cane)        Cancer Rehab [phone]    Hold cane in both hands. Raise arms up overhead. Do not allow back to arch. Hold _5__ seconds. Do __5__ times; __2__ times a day.    Hands shoulder width  Hands wider than shoulder width     Axillary web syndrome (also called cording) can happen after having breast cancer surgery when lymph nodes in the armpit are removed. It presents as if you have a thin cord in your arm and can run from the armpit all the way down into the forearm. If you've had a sentinel node biopsy, the risk is 1-20% and if you've had an axillary lymph node dissection (more than 7 nodes removed), the risk is 36-72%. The ranges vary depending on the research study.  It most often happens 3-4 weeks post-op but can happen sooner or later. There are several possibilities for what cording actually is. Although no one knows for sure as of yet, it may be related to lymphatics, veins, or other tissue. Sometimes cording resolves on its own but other times it requires physical therapy with a therapist who specializes in lymphedema and/or cancer rehab. Treatment typically involves stretching, manual techniques, and exercise. Sometimes cords get "released" while stretching or during manual treatment and the patient may experience the sensation of a "pop." This may feel strange but it is not dangerous and is a sign that the cord has released; range of motion may be improved in the process.

## 2024-01-29 ENCOUNTER — Other Ambulatory Visit: Payer: Self-pay

## 2024-01-31 ENCOUNTER — Other Ambulatory Visit: Payer: Self-pay

## 2024-01-31 ENCOUNTER — Ambulatory Visit (HOSPITAL_BASED_OUTPATIENT_CLINIC_OR_DEPARTMENT_OTHER): Admitting: Anesthesiology

## 2024-01-31 ENCOUNTER — Encounter (HOSPITAL_BASED_OUTPATIENT_CLINIC_OR_DEPARTMENT_OTHER)
Admission: RE | Disposition: A | Discharge status: Home / Self Care | Payer: Self-pay | Source: Home / Self Care | Attending: General Surgery

## 2024-01-31 ENCOUNTER — Ambulatory Visit (HOSPITAL_BASED_OUTPATIENT_CLINIC_OR_DEPARTMENT_OTHER)
Admission: RE | Admit: 2024-01-31 | Discharge: 2024-01-31 | Disposition: A | Discharge status: Home / Self Care | Attending: General Surgery | Admitting: General Surgery

## 2024-01-31 ENCOUNTER — Encounter (HOSPITAL_BASED_OUTPATIENT_CLINIC_OR_DEPARTMENT_OTHER): Payer: Self-pay | Admitting: General Surgery

## 2024-01-31 DIAGNOSIS — Z853 Personal history of malignant neoplasm of breast: Secondary | ICD-10-CM | POA: Insufficient documentation

## 2024-01-31 DIAGNOSIS — Z452 Encounter for adjustment and management of vascular access device: Secondary | ICD-10-CM | POA: Insufficient documentation

## 2024-01-31 DIAGNOSIS — G4730 Sleep apnea, unspecified: Secondary | ICD-10-CM | POA: Diagnosis not present

## 2024-01-31 DIAGNOSIS — Z95828 Presence of other vascular implants and grafts: Secondary | ICD-10-CM

## 2024-01-31 DIAGNOSIS — F32A Depression, unspecified: Secondary | ICD-10-CM | POA: Diagnosis not present

## 2024-01-31 DIAGNOSIS — C50911 Malignant neoplasm of unspecified site of right female breast: Secondary | ICD-10-CM | POA: Diagnosis not present

## 2024-01-31 HISTORY — DX: Other specified postprocedural states: R11.2

## 2024-01-31 HISTORY — PX: PORT-A-CATH REMOVAL: SHX5289

## 2024-01-31 HISTORY — DX: Nausea with vomiting, unspecified: Z98.890

## 2024-01-31 SURGERY — REMOVAL PORT-A-CATH
Anesthesia: Monitor Anesthesia Care | Site: Chest | Laterality: Left

## 2024-01-31 MED ORDER — ONDANSETRON HCL 4 MG/2ML IJ SOLN
INTRAMUSCULAR | Status: DC | PRN
  Administered 2024-01-31: 4 mg via INTRAVENOUS

## 2024-01-31 MED ORDER — LIDOCAINE 2% (20 MG/ML) 5 ML SYRINGE
INTRAMUSCULAR | Status: AC
  Filled 2024-01-31: qty 5

## 2024-01-31 MED ORDER — LACTATED RINGERS IV SOLN: INTRAVENOUS | Status: DC

## 2024-01-31 MED ORDER — LIDOCAINE-EPINEPHRINE (PF) 1 %-1:200000 IJ SOLN
INTRAMUSCULAR | Status: AC
  Filled 2024-01-31: qty 30

## 2024-01-31 MED ORDER — PHENYLEPHRINE 80 MCG/ML (10ML) SYRINGE FOR IV PUSH (FOR BLOOD PRESSURE SUPPORT)
PREFILLED_SYRINGE | INTRAVENOUS | Status: AC
  Filled 2024-01-31: qty 10

## 2024-01-31 MED ORDER — EPHEDRINE 5 MG/ML INJ
INTRAVENOUS | Status: AC
  Filled 2024-01-31: qty 5

## 2024-01-31 MED ORDER — CEFAZOLIN SODIUM-DEXTROSE 2-4 GM/100ML-% IV SOLN
INTRAVENOUS | Status: AC
  Filled 2024-01-31: qty 100

## 2024-01-31 MED ORDER — SUCCINYLCHOLINE CHLORIDE 200 MG/10ML IV SOSY
PREFILLED_SYRINGE | INTRAVENOUS | Status: AC
  Filled 2024-01-31: qty 10

## 2024-01-31 MED ORDER — SODIUM CHLORIDE 0.9 % IV SOLN
12.5000 mg | INTRAVENOUS | Status: DC | PRN
  Filled 2024-01-31: qty 0.5

## 2024-01-31 MED ORDER — CHLORHEXIDINE GLUCONATE CLOTH 2 % EX PADS: 6.0000 | MEDICATED_PAD | Freq: Once | CUTANEOUS | Status: DC

## 2024-01-31 MED ORDER — MIDAZOLAM HCL 2 MG/2ML IJ SOLN
INTRAMUSCULAR | Status: AC
  Filled 2024-01-31: qty 2

## 2024-01-31 MED ORDER — PROPOFOL 500 MG/50ML IV EMUL
INTRAVENOUS | Status: DC | PRN
  Administered 2024-01-31: 125 ug/kg/min via INTRAVENOUS

## 2024-01-31 MED ORDER — LIDOCAINE-EPINEPHRINE (PF) 1 %-1:200000 IJ SOLN
INTRAMUSCULAR | Status: DC | PRN
  Administered 2024-01-31: 10 mL via INTRAMUSCULAR

## 2024-01-31 MED ORDER — FENTANYL CITRATE (PF) 100 MCG/2ML IJ SOLN
INTRAMUSCULAR | Status: AC
  Filled 2024-01-31: qty 2

## 2024-01-31 MED ORDER — ATROPINE SULFATE 0.4 MG/ML IV SOLN
INTRAVENOUS | Status: AC
  Filled 2024-01-31: qty 1

## 2024-01-31 MED ORDER — BUPIVACAINE HCL (PF) 0.25 % IJ SOLN
INTRAMUSCULAR | Status: AC
  Filled 2024-01-31: qty 30

## 2024-01-31 MED ORDER — ACETAMINOPHEN 500 MG PO TABS
1000.0000 mg | ORAL_TABLET | ORAL | Status: AC
  Administered 2024-01-31: 1000 mg via ORAL

## 2024-01-31 MED ORDER — ACETAMINOPHEN 500 MG PO TABS
ORAL_TABLET | ORAL | Status: AC
  Filled 2024-01-31: qty 2

## 2024-01-31 MED ORDER — FENTANYL CITRATE (PF) 100 MCG/2ML IJ SOLN
INTRAMUSCULAR | Status: DC | PRN
  Administered 2024-01-31: 50 ug via INTRAVENOUS

## 2024-01-31 MED ORDER — MIDAZOLAM HCL 5 MG/5ML IJ SOLN
INTRAMUSCULAR | Status: DC | PRN
  Administered 2024-01-31: 1 mg via INTRAVENOUS

## 2024-01-31 MED ORDER — FENTANYL CITRATE (PF) 100 MCG/2ML IJ SOLN: 25.0000 ug | INTRAMUSCULAR | Status: DC | PRN

## 2024-01-31 MED ORDER — DEXAMETHASONE SODIUM PHOSPHATE 10 MG/ML IJ SOLN
INTRAMUSCULAR | Status: AC
  Filled 2024-01-31: qty 1

## 2024-01-31 MED ORDER — CEFAZOLIN SODIUM-DEXTROSE 2-4 GM/100ML-% IV SOLN
2.0000 g | INTRAVENOUS | Status: AC
  Administered 2024-01-31: 2 g via INTRAVENOUS

## 2024-01-31 MED ORDER — ONDANSETRON HCL 4 MG/2ML IJ SOLN
INTRAMUSCULAR | Status: AC
  Filled 2024-01-31: qty 2

## 2024-01-31 SURGICAL SUPPLY — 28 items
BLADE HEX COATED 2.75 (ELECTRODE) ×1 IMPLANT
BLADE SURG 15 STRL LF DISP TIS (BLADE) ×1 IMPLANT
CANISTER SUCT 1200ML W/VALVE (MISCELLANEOUS) IMPLANT
CHLORAPREP W/TINT 26 (MISCELLANEOUS) ×1 IMPLANT
COVER BACK TABLE 60X90IN (DRAPES) ×1 IMPLANT
COVER MAYO STAND STRL (DRAPES) ×1 IMPLANT
DERMABOND ADVANCED .7 DNX12 (GAUZE/BANDAGES/DRESSINGS) ×1 IMPLANT
DRAPE LAPAROTOMY 100X72 PEDS (DRAPES) ×1 IMPLANT
DRAPE UTILITY XL STRL (DRAPES) ×1 IMPLANT
ELECTRODE REM PT RTRN 9FT ADLT (ELECTROSURGICAL) ×1 IMPLANT
GLOVE BIOGEL PI IND STRL 6.5 (GLOVE) ×1 IMPLANT
GLOVE BIOGEL PI IND STRL 7.0 (GLOVE) IMPLANT
GLOVE SURG SYN 7.0 (GLOVE) ×1 IMPLANT
GLOVE SURG SYN 7.0 PF PI (GLOVE) IMPLANT
GOWN STRL REUS W/ TWL LRG LVL3 (GOWN DISPOSABLE) ×1 IMPLANT
GOWN STRL REUS W/ TWL XL LVL3 (GOWN DISPOSABLE) ×1 IMPLANT
NDL HYPO 25X1 1.5 SAFETY (NEEDLE) ×1 IMPLANT
NEEDLE HYPO 25X1 1.5 SAFETY (NEEDLE) ×1 IMPLANT
NS IRRIG 1000ML POUR BTL (IV SOLUTION) IMPLANT
PACK BASIN DAY SURGERY FS (CUSTOM PROCEDURE TRAY) ×1 IMPLANT
PENCIL SMOKE EVACUATOR (MISCELLANEOUS) ×1 IMPLANT
SPIKE FLUID TRANSFER (MISCELLANEOUS) IMPLANT
SUT MNCRL AB 4-0 PS2 18 (SUTURE) ×1 IMPLANT
SUT VIC AB 3-0 SH 27X BRD (SUTURE) ×1 IMPLANT
SYR CONTROL 10ML LL (SYRINGE) ×1 IMPLANT
TOWEL GREEN STERILE FF (TOWEL DISPOSABLE) ×1 IMPLANT
TUBE CONNECTING 20X1/4 (TUBING) IMPLANT
YANKAUER SUCT BULB TIP NO VENT (SUCTIONS) IMPLANT

## 2024-01-31 NOTE — Anesthesia Preprocedure Evaluation (Signed)
 Anesthesia Evaluation  Patient identified by MRN, date of birth, ID band Patient awake    Reviewed: Allergy & Precautions, NPO status , Patient's Chart, lab work & pertinent test results  History of Anesthesia Complications (+) PONV and history of anesthetic complications  Airway Mallampati: II  TM Distance: >3 FB Neck ROM: Full    Dental  (+) Dental Advisory Given   Pulmonary sleep apnea    breath sounds clear to auscultation       Cardiovascular negative cardio ROS  Rhythm:Regular Rate:Normal     Neuro/Psych    Depression    negative neurological ROS     GI/Hepatic negative GI ROS, Neg liver ROS,,,  Endo/Other  negative endocrine ROS    Renal/GU negative Renal ROS     Musculoskeletal   Abdominal   Peds  Hematology  (+) Blood dyscrasia, anemia   Anesthesia Other Findings Breast Cancer  Reproductive/Obstetrics                             Anesthesia Physical Anesthesia Plan  ASA: 3  Anesthesia Plan: MAC   Post-op Pain Management: Minimal or no pain anticipated   Induction: Intravenous  PONV Risk Score and Plan: 3 and Midazolam , Ondansetron , Propofol  infusion and Treatment may vary due to age or medical condition  Airway Management Planned: Simple Face Mask  Additional Equipment: None  Intra-op Plan:   Post-operative Plan:   Informed Consent: I have reviewed the patients History and Physical, chart, labs and discussed the procedure including the risks, benefits and alternatives for the proposed anesthesia with the patient or authorized representative who has indicated his/her understanding and acceptance.     Dental advisory given  Plan Discussed with: CRNA  Anesthesia Plan Comments:         Anesthesia Quick Evaluation

## 2024-01-31 NOTE — Discharge Instructions (Addendum)
 [facility] Office Phone Number [phone]   POST OP INSTRUCTIONS  Always review your discharge instruction sheet given to you by the facility where your surgery was performed.  IF YOU HAVE DISABILITY OR FAMILY LEAVE FORMS, YOU MUST BRING THEM TO THE OFFICE FOR PROCESSING.  DO NOT GIVE THEM TO YOUR DOCTOR.  Take 2 tylenol  (acetominophen) three times a day for 3 days.  If you still have pain, add ibuprofen  with food in between if able to take this (if you have kidney issues or stomach issues, do not take ibuprofen ).  If both of those are not enough, add the narcotic pain pill.  If you find you are needing a lot of this overnight after surgery, call the next morning for a refill.   Take your usually prescribed medications unless otherwise directed If you need a refill on your pain medication, please contact your pharmacy.  They will contact our office to request authorization.  Prescriptions will not be filled after 5pm or on week-ends. You should eat very light the first 24 hours after surgery, such as soup, crackers, pudding, etc.  Resume your normal diet the day after surgery It is common to experience some constipation if taking pain medication after surgery.  Increasing fluid intake and taking a stool softener will usually help or prevent this problem from occurring.  A mild laxative (Milk of Magnesia or Miralax ) should be taken according to package directions if there are no bowel movements after 48 hours. You may shower in 48 hours.  The surgical glue will flake off in 2-3 weeks.   ACTIVITIES:  No strenuous activity or heavy lifting for 1 week.   You may drive when you no longer are taking prescription pain medication, you can comfortably wear a seatbelt, and you can safely maneuver your car and apply brakes. RETURN TO WORK:  __________as tolerated_______________ Suzanne Tucker should see your doctor in the office for a follow-up appointment approximately three-four weeks after your  surgery.    WHEN TO CALL YOUR DOCTOR: Fever over 101.0 Nausea and/or vomiting. Extreme swelling or bruising. Continued bleeding from incision. Increased pain, redness, or drainage from the incision.  The clinic staff is available to answer your questions during regular business hours.  Please don't hesitate to call and ask to speak to one of the nurses for clinical concerns.  If you have a medical emergency, go to the nearest emergency room or call 911.  A surgeon from [facility] is always on call at the hospital.  For further questions, please visit [email or web address]     Post Anesthesia Home Care Instructions  Activity: Get plenty of rest for the remainder of the day. A responsible individual must stay with you for 24 hours following the procedure.  For the next 24 hours, DO NOT: -Drive a car -Advertising copywriter -Drink alcoholic beverages -Take any medication unless instructed by your physician -Make any legal decisions or sign important papers.  Meals: Start with liquid foods such as gelatin or soup. Progress to regular foods as tolerated. Avoid greasy, spicy, heavy foods. If nausea and/or vomiting occur, drink only clear liquids until the nausea and/or vomiting subsides. Call your physician if vomiting continues.  Special Instructions/Symptoms: Your throat may feel dry or sore from the anesthesia or the breathing tube placed in your throat during surgery. If this causes discomfort, gargle with warm salt water . The discomfort should disappear within 24 hours.  Next dose of Tylenol  may be given at 12:47pm if needed.

## 2024-01-31 NOTE — Transfer of Care (Signed)
 Immediate Anesthesia Transfer of Care Note  Patient: Ellah Alexandra Tarquinio  Procedure(s) Performed: REMOVAL PORT-A-CATH (Left: Chest)  Patient Location: PACU  Anesthesia Type:MAC  Level of Consciousness: awake, alert , and oriented  Airway & Oxygen Therapy: Patient Spontanous Breathing and Patient connected to nasal cannula oxygen  Post-op Assessment: Report given to RN and Post -op Vital signs reviewed and stable  Post vital signs: Reviewed and stable  Last Vitals:  Vitals Value Taken Time  BP    Temp    Pulse 92 01/31/24 0930  Resp    SpO2 98 % 01/31/24 0930  Vitals shown include unfiled device data.  Last Pain:  Vitals:   01/31/24 0641  TempSrc: Temporal  PainSc: 0-No pain         Complications: No notable events documented.

## 2024-01-31 NOTE — H&P (Signed)
 Suzanne Tucker is an 43 y.o. female.   Chief Complaint: port in place HPI:  Pt is s/p bilateral mastectomies and chemo for right breast cancer.  She desires port removal.   Past Medical History:  Diagnosis Date   Anginal pain (HCC) 02/2019   related to MVP   Back pain    Breast cancer (HCC)    Depression    Dysrhythmia    MVP   Mitral prolapse    PONV (postoperative nausea and vomiting)    Sleep apnea    mild no cpap indicated    Past Surgical History:  Procedure Laterality Date   BREAST SURGERY Right 2024   Breast biopsy x 2   CESAREAN SECTION  03/30/2008   MASTECTOMY WITH AXILLARY LYMPH NODE DISSECTION Bilateral 07/27/2023   Procedure: BILATERAL MASTECTOMY WITH RIGHT AXILLARY LYMPH NODE DISSECTION;  Surgeon: Lockie Rima, MD;  Location: [facility];  Service: General;  Laterality: Bilateral;  PEC BLOCK   PORTACATH PLACEMENT N/A 01/27/2023   Procedure: INSERTION PORT-A-CATH;  Surgeon: Lockie Rima, MD;  Location: [facility];  Service: General;  Laterality: N/A;  leaving accessed   ROBOTIC ASSISTED TOTAL HYSTERECTOMY Bilateral 03/15/2019   Procedure: XI ROBOTIC ASSISTED TOTAL HYSTERECTOMY with Bilateral Salpingectomy;  Surgeon: Ona Bidding, MD;  Location: [facility];  Service: Gynecology;  Laterality: Bilateral;   TUBAL LIGATION  2017    Family History  Problem Relation Age of Onset   Stroke Mother    Alzheimer's disease Father    Diabetes Maternal Grandmother    Heart disease Maternal Grandmother    Heart attack Maternal Grandmother    Diabetes Maternal Grandfather    Heart disease Maternal Grandfather    Alzheimer's disease Paternal Grandmother    Dementia Paternal Grandmother    Alzheimer's disease Paternal Grandfather    Dementia Paternal Grandfather    Alcohol abuse Paternal Grandfather    Social History:  reports that she has never smoked. She has never used smokeless tobacco. She reports that she does not currently use  alcohol. She reports that she does not use drugs.  Allergies:  Allergies  Allergen Reactions   Morphine Anaphylaxis and Shortness Of Breath   Tape Other (See Comments)    NEEDS DRESSINGS FOR SENSITIVE SKIN!!!   Tessalon [Benzonatate] Hives, Itching and Other (See Comments)    Hives, itching, burning all over.    Hydrocodone Hives   Latex Rash   Antihistamines, Loratadine-Type Other (See Comments)    Patient reports numbness of the tongue   Codeine Nausea And Vomiting   Kiwi Extract Swelling and Other (See Comments)    Mouth tingles, too   Oxycontin  [Oxycodone ] Other (See Comments)    RPh has recommended this not be taken  (Oxycontin )   Tramadol  Swelling and Other (See Comments)    Tingling in the mouth with swelling     Medications Prior to Admission  Medication Sig Dispense Refill   acetaminophen  (TYLENOL ) 500 MG tablet Take 1,000 mg by mouth every 6 (six) hours as needed for fever or mild pain (pain score 1-3).     calcium  carbonate (TUMS - DOSED IN MG ELEMENTAL CALCIUM ) 500 MG chewable tablet Chew 1,000 mg by mouth as needed for indigestion or heartburn.     capecitabine  (XELODA ) 500 MG tablet TAKE 2 TABLETS BY MOUTH EVERY 12 HOURS DAYS 1-14, AND OFF FOR 7 DAYS, ON A 21 DAY CYCLE 56 tablet 1   diclofenac  Sodium (VOLTAREN ) 1 % GEL Research Patient: Apply  0.5 grams (1 fingertip) to each hand and each foot twice daily for up to 12 weeks 400 g 0   fluticasone  (FLONASE ) 50 MCG/ACT nasal spray Place 2 sprays into both nostrils daily. 16 g 0   lidocaine -prilocaine  (EMLA ) cream Apply to affected area once 30 g 3   Multiple Vitamin (MULTIVITAMIN) tablet Take 1 tablet by mouth daily.     methylPREDNISolone  (MEDROL  DOSEPAK) 4 MG TBPK tablet Take as directed 21 tablet 0   ondansetron  (ZOFRAN ) 8 MG tablet Take 1 tablet (8 mg total) by mouth every 8 (eight) hours as needed for nausea or vomiting. (Patient not taking: Reported on 12/08/2023) 30 tablet 2    No results found for this or any  previous visit (from the past 48 hours). No results found.  Review of Systems  All other systems reviewed and are negative.   Blood pressure 116/72, pulse 83, temperature 98.2 F (36.8 C), temperature source Temporal, resp. rate 17, height 5\' 1"  (1.549 m), weight 65.2 kg, SpO2 100%. Physical Exam Vitals reviewed.  Constitutional:      General: She is not in acute distress.    Appearance: Normal appearance.  HENT:     Head: Normocephalic.     Nose: Nose normal.  Eyes:     General: No scleral icterus.    Conjunctiva/sclera: Conjunctivae normal.     Pupils: Pupils are equal, round, and reactive to light.  Cardiovascular:     Rate and Rhythm: Normal rate.     Pulses: Normal pulses.  Pulmonary:     Effort: Pulmonary effort is normal.     Comments: Left port in place. Abdominal:     General: Abdomen is flat.  Skin:    General: Skin is warm and dry.     Capillary Refill: Capillary refill takes 2 to 3 seconds.  Neurological:     Mental Status: She is alert.  Psychiatric:        Mood and Affect: Mood normal.        Behavior: Behavior normal.        Thought Content: Thought content normal.        Judgment: Judgment normal.      Assessment/Plan Left p[ort in place. No evidence of cancer Plan port removal Reviewed surgery.  Patient wishes to proceed.    Lockie Rima, MD 01/31/2024, 8:32 AM

## 2024-01-31 NOTE — Anesthesia Postprocedure Evaluation (Signed)
 Anesthesia Post Note  Patient: Eudelia Alexandra Pester  Procedure(s) Performed: REMOVAL PORT-A-CATH (Left: Chest)     Patient location during evaluation: PACU Anesthesia Type: MAC Level of consciousness: awake and alert Pain management: pain level controlled Vital Signs Assessment: post-procedure vital signs reviewed and stable Respiratory status: spontaneous breathing, nonlabored ventilation and respiratory function stable Cardiovascular status: blood pressure returned to baseline and stable Postop Assessment: no apparent nausea or vomiting Anesthetic complications: no   No notable events documented.  Last Vitals:  Vitals:   01/31/24 0945 01/31/24 1001  BP: 90/63 92/63  Pulse: 75 65  Resp: 19 16  Temp:  36.5 C  SpO2: 97% 98%    Last Pain:  Vitals:   01/31/24 1001  TempSrc: Temporal  PainSc: 0-No pain                 Earvin Goldberg

## 2024-01-31 NOTE — Op Note (Signed)
  PRE-OPERATIVE DIAGNOSIS:  un-needed Port-A-Cath for right breast cancer  POST-OPERATIVE DIAGNOSIS:  Same   PROCEDURE:  Procedure(s):  REMOVAL PORT-A-CATH  SURGEON:  Surgeon(s):  Stark Klein, MD  ANESTHESIA:   MAC + local  EBL:   Minimal  SPECIMEN:  None  Complications : none known  Procedure:   Pt was  identified in the holding area and taken to the operating room where she was placed supine on the operating room table.  MAC anesthesia was induced.  The left upper chest was prepped and draped.  The prior incision was anesthetized with local anesthetic.  The incision was opened with a #15 blade.  The subcutaneous tissue was divided with the cautery.  The port was identified and the capsule opened.  The four 2-0 prolene sutures were removed.  The port was then removed and pressure held on the tract.  The catheter appeared intact without evidence of breakage, length was 20 cm.  The wound was inspected for hemostasis, which was achieved with cautery.  The wound was closed with 3-0 vicryl deep dermal interrupted sutures and 4-0 Monocryl running subcuticular suture.  The wound was cleaned, dried, and dressed with dermabond.  The patient was awakened from anesthesia and taken to the PACU in stable condition.  Needle, sponge, and instrument counts are correct.

## 2024-02-02 ENCOUNTER — Encounter: Payer: Self-pay | Admitting: Hematology and Oncology

## 2024-02-07 ENCOUNTER — Other Ambulatory Visit: Payer: Self-pay | Admitting: Adult Health

## 2024-02-07 DIAGNOSIS — C50311 Malignant neoplasm of lower-inner quadrant of right female breast: Secondary | ICD-10-CM

## 2024-02-08 ENCOUNTER — Encounter: Payer: Self-pay | Admitting: Hematology and Oncology

## 2024-02-09 ENCOUNTER — Ambulatory Visit

## 2024-02-20 ENCOUNTER — Other Ambulatory Visit: Payer: Self-pay | Admitting: *Deleted

## 2024-02-20 ENCOUNTER — Encounter: Payer: Self-pay | Admitting: Hematology and Oncology

## 2024-02-20 DIAGNOSIS — D84821 Immunodeficiency due to drugs: Secondary | ICD-10-CM

## 2024-02-20 DIAGNOSIS — C50311 Malignant neoplasm of lower-inner quadrant of right female breast: Secondary | ICD-10-CM

## 2024-02-23 ENCOUNTER — Encounter: Payer: Self-pay | Admitting: Hematology and Oncology

## 2024-03-07 ENCOUNTER — Inpatient Hospital Stay: Attending: Hematology and Oncology | Admitting: Hematology and Oncology

## 2024-03-07 VITALS — BP 102/66 | HR 73 | Temp 98.1°F | Resp 18 | Ht 61.0 in | Wt 148.1 lb

## 2024-03-07 DIAGNOSIS — Z5112 Encounter for antineoplastic immunotherapy: Secondary | ICD-10-CM | POA: Insufficient documentation

## 2024-03-07 DIAGNOSIS — I341 Nonrheumatic mitral (valve) prolapse: Secondary | ICD-10-CM | POA: Insufficient documentation

## 2024-03-07 DIAGNOSIS — Z171 Estrogen receptor negative status [ER-]: Secondary | ICD-10-CM | POA: Diagnosis not present

## 2024-03-07 DIAGNOSIS — Z1722 Progesterone receptor negative status: Secondary | ICD-10-CM | POA: Diagnosis not present

## 2024-03-07 DIAGNOSIS — C50311 Malignant neoplasm of lower-inner quadrant of right female breast: Secondary | ICD-10-CM | POA: Diagnosis not present

## 2024-03-07 DIAGNOSIS — Z79899 Other long term (current) drug therapy: Secondary | ICD-10-CM | POA: Diagnosis not present

## 2024-03-07 NOTE — Assessment & Plan Note (Signed)
 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%, HER2 1+  01/25/2023: PET/CT: Right breast cancer, right axilla, retropectoral and right cervical lymph nodes   Treatment plan: Neoadjuvant chemotherapy with Taxol  carbo Keytruda  weekly x 12 followed by Adriamycin  Cytoxan  Keytruda  followed by Keytruda  maintenance until 12/30/2023 07/27/2023: Bilateral mastectomies: Left mastectomy: Benign, right mastectomy: No evidence of residual cancer, 8/20 lymph nodes positive with extranodal extension in 2 lymph nodes Adjuvant radiation therapy 09/08/2023-10/24/2022 Capecitabine  (to complete June 2025) with Keytruda  (to complete 12/29/2023) -------------------------------------------------------------------------------------------------------- Treatment plan: capecitabine  maintenance therapy (to complete June 2025)   08/19/2023: PET/CT: Benign 10/17/2023: Brain MRI: Benign Back and rib pain: CT CAP 12/23/2023: Right upper lobe nodularity from radiation, right mastectomy changes, duodenitis, cholelithiasis Bone scan 12/28/2023: Faint activity left posterior ribs possibly traumatic.  No bone metastasis  Capecitabine  toxicities:

## 2024-03-07 NOTE — Progress Notes (Signed)
 Patient Care Team: Cameron Cea, MD as PCP - General (Hematology and Oncology) Lucendia Rusk, MD as PCP - Cardiology (Cardiology) Verona Goodwill, MD as PCP - Electrophysiology (Cardiology) Cameron Cea, MD as Consulting Physician (Hematology and Oncology) Colie Dawes, MD as Attending Physician (Radiation Oncology) Lockie Rima, MD as Consulting Physician (General Surgery) Alane Hsu, RN as Oncology Nurse Navigator Auther Bo, RN as Oncology Nurse Navigator  DIAGNOSIS:  Encounter Diagnosis  Name Primary?   Malignant neoplasm of lower-inner quadrant of right breast of female, estrogen receptor negative (HCC) Yes    SUMMARY OF ONCOLOGIC HISTORY: Oncology History  Malignant neoplasm of lower-inner quadrant of right breast of female, estrogen receptor negative (HCC)  01/10/2023 Initial Diagnosis   Palpable lump right axilla x 2 weeks bulky axillary lymphadenopathy ultrasound breast: 2 masses at 4 o'clock position 1.1 cm and 0.6 cm multiple axillary lymph nodes largest 4.5 cm also level 2 and 3 (subpectoral lymph node as well): Biopsy: Grade 3 IDC ER 0%, PR 0%, Ki-67 95%, HER2 1+   01/26/2023 Genetic Testing   Negative [employer] Custom Panel (37 genes).  Report date is 01/26/2023.    The [employer] Custom Cancers + RNA Panel includes sequencing, deletion/duplication, and RNA analysis of the following 37 genes: APC, ATM, AXIN2, BARD1, BMPR1A, BRCA1, BRCA2, BRIP1, CDH1, CDK4*, CDKN2A (p14ARF)*, CDKN2A (p16INK4a), CHEK2, CTNNA1, DICER1, EPCAM*, FH, GREM1*, HOXB13*, MBD4*, MLH1, MSH2, MSH3, MSH6, MUTYH, NF1, NTHL1, PALB2, PMS2, POLD1, POLE, PTEN, RAD51C, RAD51D, SMAD4, SMARCA4, STK11, TP53.  *Genes without RNA analysis.    01/28/2023 - 06/27/2023 Chemotherapy   Patient is on Treatment Plan : BREAST Pembrolizumab  (200) D1 + Carboplatin  (1.5) D1,8,15 + Paclitaxel  (80) D1,8,15 q21d X 4 cycles / Pembrolizumab  (200) D1 + AC D1 q21d x 4 cycles     07/15/2023 -  Chemotherapy   Patient  is on Treatment Plan : BREAST Pembrolizumab  (200) q21d x 27 weeks     09/07/2023 - 10/26/2023 Radiation Therapy   First Treatment Date: 2023-09-07 Last Treatment Date: 2023-10-26   Plan Name: CW_R_BO Site: Chest Wall, Right Technique: 3D Mode: Photon Dose Per Fraction: 1.8 Gy Prescribed Dose (Delivered / Prescribed): 25.2 Gy / 25.2 Gy Prescribed Fxs (Delivered / Prescribed): 14 / 14   Plan Name: CW_R_Neck_Ax Site: Chest Wall, Right Technique: 3D Mode: Photon Dose Per Fraction: 1.8 Gy Prescribed Dose (Delivered / Prescribed): 50.4 Gy / 50.4 Gy Prescribed Fxs (Delivered / Prescribed): 28 / 28   Plan Name: CW_R_Bst Site: Chest Wall, Right Technique: 3D Mode: Photon Dose Per Fraction: 1.8 Gy Prescribed Dose (Delivered / Prescribed): 9 Gy / 9 Gy Prescribed Fxs (Delivered / Prescribed): 5 / 5   Plan Name: CW_R Site: Chest Wall, Right Technique: 3D Mode: Photon Dose Per Fraction: 1.8 Gy Prescribed Dose (Delivered / Prescribed): 25.2 Gy / 25.2 Gy Prescribed Fxs (Delivered / Prescribed): 14 / 14       CHIEF COMPLIANT: Cuts of the hands from Xeloda , sores on the bottom of the feet  HISTORY OF PRESENT ILLNESS:  History of Present Illness Suzanne Tucker is a 43 year old female who presents for follow-up after completing her cancer treatment regimen.  She completed her cancer treatment on June 5th, 2025, which included radiation therapy starting December 5th, 2024. She experiences deeper cuts on her hands and feet, described as 'little cuts' that are more open and deeper, affecting her active lifestyle. Towards the end of her treatment, she experienced vomiting at 2 AM for three consecutive days  and felt slightly sweaty without significant fatigue. She uses lotions, including Aquaphor, for her skin, and Veltin for pain management.  A PET scan was performed in March 2025, with a follow-up scan scheduled for June 6th, 2025. She has been in remission since her surgery on  October 23rd, 2024. She is participating in a research study involving a saliva test to explore connections between immune systems and cancer, as her aunt has multiple sclerosis.  She is currently on Delona, which she has completed, and has unopened bottles to return. She also has an unopened bottle of gabapentin , which she plans to dispose of properly.     ALLERGIES:  is allergic to morphine; tape; tessalon [benzonatate]; hydrocodone; latex; antihistamines, loratadine-type; codeine; kiwi extract; oxycontin  [oxycodone ]; and tramadol .  MEDICATIONS:  Current Outpatient Medications  Medication Sig Dispense Refill   acetaminophen  (TYLENOL ) 500 MG tablet Take 1,000 mg by mouth every 6 (six) hours as needed for fever or mild pain (pain score 1-3).     Multiple Vitamin (MULTIVITAMIN) tablet Take 1 tablet by mouth daily.     calcium  carbonate (TUMS - DOSED IN MG ELEMENTAL CALCIUM ) 500 MG chewable tablet Chew 1,000 mg by mouth as needed for indigestion or heartburn. (Patient not taking: Reported on 03/07/2024)     capecitabine  (XELODA ) 500 MG tablet TAKE 2 TABLETS BY MOUTH EVERY 12 HOURS DAYS 1-14, AND OFF FOR 7 DAYS, ON A 21 DAY CYCLE (Patient not taking: Reported on 03/07/2024) 56 tablet 1   diclofenac  Sodium (VOLTAREN ) 1 % GEL Research Patient: Apply 0.5 grams (1 fingertip) to each hand and each foot twice daily for up to 12 weeks (Patient not taking: Reported on 03/07/2024) 400 g 0   fluticasone  (FLONASE ) 50 MCG/ACT nasal spray Place 2 sprays into both nostrils daily. (Patient not taking: Reported on 03/07/2024) 16 g 0   lidocaine -prilocaine  (EMLA ) cream Apply to affected area once (Patient not taking: Reported on 03/07/2024) 30 g 3   ondansetron  (ZOFRAN ) 8 MG tablet Take 1 tablet (8 mg total) by mouth every 8 (eight) hours as needed for nausea or vomiting. (Patient not taking: Reported on 03/07/2024) 30 tablet 2   No current facility-administered medications for this visit.    PHYSICAL EXAMINATION: ECOG  PERFORMANCE STATUS: 1 - Symptomatic but completely ambulatory  Vitals:   03/07/24 1359  BP: 102/66  Pulse: 73  Resp: 18  Temp: 98.1 F (36.7 C)  SpO2: 100%   Filed Weights   03/07/24 1359  Weight: 148 lb 1.6 oz (67.2 kg)    Physical Exam   (exam performed in the presence of a chaperone)  LABORATORY DATA:  I have reviewed the data as listed    Latest Ref Rng & Units 12/29/2023    1:05 PM 12/08/2023   10:47 AM 11/18/2023    9:11 AM  CMP  Glucose 70 - 99 mg/dL 161  93  88   BUN 6 - 20 mg/dL 11  9  12    Creatinine 0.44 - 1.00 mg/dL 0.96  0.45  4.09   Sodium 135 - 145 mmol/L 139  137  136   Potassium 3.5 - 5.1 mmol/L 3.5  3.9  4.2   Chloride 98 - 111 mmol/L 105  105  103   CO2 22 - 32 mmol/L 29  28  28    Calcium  8.9 - 10.3 mg/dL 9.0  9.0  9.3   Total Protein 6.5 - 8.1 g/dL 6.9  6.9  7.0   Total Bilirubin 0.0 - 1.2  mg/dL 0.6  0.6  0.5   Alkaline Phos 38 - 126 U/L 75  67  62   AST 15 - 41 U/L 14  15  13    ALT 0 - 44 U/L 8  7  7      Lab Results  Component Value Date   WBC 3.9 (L) 12/29/2023   HGB 10.0 (L) 12/29/2023   HCT 29.2 (L) 12/29/2023   MCV 98.6 12/29/2023   PLT 211 12/29/2023   NEUTROABS 2.8 12/29/2023    ASSESSMENT & PLAN:  Malignant neoplasm of lower-inner quadrant of right breast of female, estrogen receptor negative (HCC) 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%, HER2 1+  01/25/2023: PET/CT: Right breast cancer, right axilla, retropectoral and right cervical lymph nodes   Treatment plan: Neoadjuvant chemotherapy with Taxol  carbo Keytruda  weekly x 12 followed by Adriamycin  Cytoxan  Keytruda  followed by Keytruda  maintenance until 12/30/2023 07/27/2023: Bilateral mastectomies: Left mastectomy: Benign, right mastectomy: No  evidence of residual cancer, 8/20 lymph nodes positive with extranodal extension in 2 lymph nodes Adjuvant radiation therapy 09/08/2023-10/24/2022 Capecitabine  (completed June 2025) with Keytruda  (completed 12/29/2023) -------------------------------------------------------------------------------------------------------- Treatment plan: capecitabine  maintenance therapy (to complete June 2025)   08/19/2023: PET/CT: Benign 10/17/2023: Brain MRI: Benign Back and rib pain: CT CAP 12/23/2023: Right upper lobe nodularity from radiation, right mastectomy changes, duodenitis, cholelithiasis Bone scan 12/28/2023: Faint activity left posterior ribs possibly traumatic.  No bone metastasis  Capecitabine  toxicities: Skin breakdown Hands and feet soreness She had completed 6 months of capecitabine  and therefore I recommended that she discontinue it at this time. Guardant reveal testing has been ordered. PET scan is going to be done soon and we will follow-up after that with a telephone visit. ------------------------------------- Assessment and Plan Assessment & Plan Malignant neoplasm of lower-inner quadrant of right breast Completed capecitabine  treatment. Side effects include palmar-plantar erythrodysesthesia and emesis. PET scan scheduled to assess remission status. Capecitabine  discontinued due to side effects and treatment completion. - Discontinue capecitabine . - Return unopened medication for redistribution. - Undergo PET scan on Friday. - Change follow-up to phone call for PET results discussion.       No orders of the defined types were placed in this encounter.  The patient has a good understanding of the overall plan. she agrees with it. she will call with any problems that may develop before the next visit here. Total time spent: 30 mins including face to face time and time spent for planning, charting and co-ordination of care   Margert Sheerer, MD 03/07/24

## 2024-03-09 ENCOUNTER — Encounter: Payer: Self-pay | Admitting: Pharmacist

## 2024-03-09 ENCOUNTER — Encounter: Payer: Self-pay | Admitting: Adult Health

## 2024-03-09 ENCOUNTER — Encounter (HOSPITAL_COMMUNITY)
Admission: RE | Admit: 2024-03-09 | Discharge: 2024-03-09 | Disposition: A | Discharge status: Home / Self Care | Source: Ambulatory Visit | Attending: Hematology and Oncology | Admitting: Hematology and Oncology

## 2024-03-09 ENCOUNTER — Encounter: Payer: Self-pay | Admitting: Hematology and Oncology

## 2024-03-09 ENCOUNTER — Encounter (INDEPENDENT_AMBULATORY_CARE_PROVIDER_SITE_OTHER): Payer: Self-pay | Admitting: Otolaryngology

## 2024-03-09 ENCOUNTER — Encounter: Payer: Self-pay | Admitting: Physician Assistant

## 2024-03-09 ENCOUNTER — Ambulatory Visit (INDEPENDENT_AMBULATORY_CARE_PROVIDER_SITE_OTHER): Admitting: Otolaryngology

## 2024-03-09 VITALS — BP 127/83 | HR 90 | Ht 61.0 in | Wt 143.0 lb

## 2024-03-09 DIAGNOSIS — Z171 Estrogen receptor negative status [ER-]: Secondary | ICD-10-CM | POA: Diagnosis present

## 2024-03-09 DIAGNOSIS — H8112 Benign paroxysmal vertigo, left ear: Secondary | ICD-10-CM | POA: Diagnosis not present

## 2024-03-09 DIAGNOSIS — H9312 Tinnitus, left ear: Secondary | ICD-10-CM | POA: Diagnosis not present

## 2024-03-09 DIAGNOSIS — C50311 Malignant neoplasm of lower-inner quadrant of right female breast: Secondary | ICD-10-CM | POA: Diagnosis present

## 2024-03-09 DIAGNOSIS — H9190 Unspecified hearing loss, unspecified ear: Secondary | ICD-10-CM

## 2024-03-09 LAB — GLUCOSE, CAPILLARY: Glucose-Capillary: 92 mg/dL (ref 70–99)

## 2024-03-09 MED ORDER — FLUDEOXYGLUCOSE F - 18 (FDG) INJECTION
7.0800 | Freq: Once | INTRAVENOUS | Status: AC
  Administered 2024-03-09: 7.08 via INTRAVENOUS

## 2024-03-09 NOTE — Progress Notes (Signed)
 Dear Dr. Lurena Sally, Here is my assessment for our mutual patient, Suzanne Tucker. Thank you for allowing me the opportunity to care for your patient. Please do not hesitate to contact me should you have any other questions. Sincerely, Dr. Milon Aloe  Otolaryngology Clinic Note Referring provider: Dr. Lurena Sally HPI:  Suzanne Tucker is a 43 y.o. female kindly referred by Dr. Lurena Sally for evaluation of left sided vertigo  Initial visit (03/2024): Patient reports: she has vertigo x2. First in September 2024 after completing chemotherapy, and then second time this spring. No prior URI or sickness. If she sat up or laid down, she would have vertigo - lasted about 20 sec- 45 seconds. If she turned her head to left she'd have vertigo. Other activities no issue. Fall lasted about a week, second time about two weeks. Got steroids both times, and did vestibular PT -- vestibular PT helped, no recurrence since. No vertigo currently Of note, she does report she has had tinnitus on left since chemo - very rare. Hearing decline since chemo only on left for breast cancer. Her chemo regimen was carboplatin , paclitaxel . Has not tried anything for this except prescribed steroids which may have helped some. No ASA use. Does have neck pain.  Patient currently denies: ear pain, fullness, vertigo, drainage, tinnitus Patient additionally denies: deep pain in ear canal, eustachian tube symptoms such as popping/crackling, sensitive to pressure changes Patient also denies barotrauma, vestibular suppressant use Prior ear surgery: no No frequent ear infections. No recent hearing test.  H&N Surgery: no Personal or FHx of bleeding dz or anesthesia difficulty: no  GLP-1: no AP/AC: no  Tobacco: no Occupation: Runner, broadcasting/film/video (History)  PMHx: Breast cancer, MVP  Independent Review of Additional Tests or Records:  Dr. Lurena Sally (10/2023) noted vertigo returned, left ear popping MRI Brain 10/12/2023 independently interpreted with respect  to ears (10/12/2023): no dedicated IAC cuts but no mastoid effusion or retrocochlear lesions noted Vestibular rehab (10/25/2023):    Dr. Lee Public (12/29/2023): noted breast cancer, treated with CRT and pembro -- prior on paclitaxel  and carboplatin  Labs: CBC and CMP 12/08/2023: WBC 3.9, Hgb 10; BUN/Cr wnl  PMH/Meds/All/SocHx/FamHx/ROS:   Past Medical History:  Diagnosis Date   Anginal pain (HCC) 02/2019   related to MVP   Back pain    Breast cancer (HCC)    Depression    Dysrhythmia    MVP   Mitral prolapse    PONV (postoperative nausea and vomiting)    Sleep apnea    mild no cpap indicated     Past Surgical History:  Procedure Laterality Date   BREAST SURGERY Right 2024   Breast biopsy x 2   CESAREAN SECTION  03/30/2008   MASTECTOMY WITH AXILLARY LYMPH NODE DISSECTION Bilateral 07/27/2023   Procedure: BILATERAL MASTECTOMY WITH RIGHT AXILLARY LYMPH NODE DISSECTION;  Surgeon: Lockie Rima, MD;  Location: [facility];  Service: General;  Laterality: Bilateral;  PEC BLOCK   PORT-A-CATH REMOVAL Left 01/31/2024   Procedure: REMOVAL PORT-A-CATH;  Surgeon: Lockie Rima, MD;  Location: [facility];  Service: General;  Laterality: Left;   PORTACATH PLACEMENT N/A 01/27/2023   Procedure: INSERTION PORT-A-CATH;  Surgeon: Lockie Rima, MD;  Location: [facility];  Service: General;  Laterality: N/A;  leaving accessed   ROBOTIC ASSISTED TOTAL HYSTERECTOMY Bilateral 03/15/2019   Procedure: XI ROBOTIC ASSISTED TOTAL HYSTERECTOMY with Bilateral Salpingectomy;  Surgeon: Ona Bidding, MD;  Location: [facility];  Service: Gynecology;  Laterality: Bilateral;   TUBAL LIGATION  2017  Family History  Problem Relation Age of Onset   Stroke Mother    Alzheimer's disease Father    Diabetes Maternal Grandmother    Heart disease Maternal Grandmother    Heart attack Maternal Grandmother    Diabetes Maternal Grandfather    Heart disease Maternal Grandfather     Alzheimer's disease Paternal Grandmother    Dementia Paternal Grandmother    Alzheimer's disease Paternal Grandfather    Dementia Paternal Grandfather    Alcohol abuse Paternal Grandfather      Social Connections: Moderately Isolated (12/27/2022)   Social Connection and Isolation Panel    Frequency of Communication with Friends and Family: More than three times a week    Frequency of Social Gatherings with Friends and Family: Once a week    Attends Religious Services: Never    Database administrator or Organizations: No    Attends Engineer, structural: Not on file    Marital Status: Married      Current Outpatient Medications:    acetaminophen  (TYLENOL ) 500 MG tablet, Take 1,000 mg by mouth every 6 (six) hours as needed for fever or mild pain (pain score 1-3)., Disp: , Rfl:    calcium  carbonate (TUMS - DOSED IN MG ELEMENTAL CALCIUM ) 500 MG chewable tablet, Chew 1,000 mg by mouth as needed for indigestion or heartburn., Disp: , Rfl:    diclofenac  Sodium (VOLTAREN ) 1 % GEL, Research Patient: Apply 0.5 grams (1 fingertip) to each hand and each foot twice daily for up to 12 weeks (Patient not taking: Reported on 03/14/2024), Disp: 400 g, Rfl: 0   fluticasone  (FLONASE ) 50 MCG/ACT nasal spray, Place 2 sprays into both nostrils daily. (Patient taking differently: Place 2 sprays into both nostrils daily as needed for allergies.), Disp: 16 g, Rfl: 0   lidocaine -prilocaine  (EMLA ) cream, Apply to affected area once (Patient not taking: Reported on 03/14/2024), Disp: 30 g, Rfl: 3   ondansetron  (ZOFRAN ) 8 MG tablet, Take 1 tablet (8 mg total) by mouth every 8 (eight) hours as needed for nausea or vomiting. (Patient not taking: Reported on 03/14/2024), Disp: 30 tablet, Rfl: 2   ALPRAZolam  (XANAX ) 0.5 MG tablet, Take 1 tablet (0.5 mg total) by mouth 3 (three) times daily as needed for anxiety., Disp: 30 tablet, Rfl: 1   Dextrose -Fructose-Sod Citrate (NAUZENE) [id number] MG CHEW, Chew 1 tablet by  mouth daily as needed (Nausea)., Disp: , Rfl:    pantoprazole  (PROTONIX ) 40 MG tablet, Take 1 tablet (40 mg total) by mouth 2 (two) times daily. Protonix  40mg  PO BID for 8 weeks then reduce to 40 mg daily, Disp: 180 tablet, Rfl: 3   sucralfate  (CARAFATE ) 1 GM/10ML suspension, Take 10 mLs (1 g total) by mouth 4 (four) times daily for 14 days., Disp: 560 mL, Rfl: 0   Physical Exam:   BP 127/83 (BP Location: Left Arm, Patient Position: Sitting, Cuff Size: Normal)   Pulse 90   Ht 5' 1 (1.549 m)   Wt 143 lb (64.9 kg)   SpO2 98%   BMI 27.02 kg/m   Salient findings:  CN II-XII intact Given history and complaints, ear microscopy was indicated and performed for evaluation with findings as below in physical exam section and in procedures; Bilateral EAC clear and TM intact with well pneumatized middle ear spaces Weber 512: mid Rinne 512: AC > BC b/l  Anterior rhinoscopy: Septum intact; bilateral inferior turbinates without significant hypertrophy No respiratory distress or stridor Gait normal, DH neg   Seprately Identifiable  Procedures:  Prior to initiating any procedures, risks/benefits/alternatives were explained to the patient and verbal consent obtained. Procedure: Bilateral ear microscopy using microscope (CPT [id number]) Pre-procedure diagnosis: vertigo, hearing loss Post-procedure diagnosis: same Indication: see above; given patient's otologic complaints and history, for improved and comprehensive examination of external ear and tympanic membrane, bilateral otologic examination using microscope was performed. Prior to proceeding, verbal consent was obtained after discussion of R/B/A  Procedure: Patient was placed semi-recumbent. Both ear canals were examined using the microscope with findings above. Patient tolerated the procedure well.   Impression & Plans:  Tayia Oliff is a 43 y.o. female with:  1. Subjective hearing loss   2. Benign paroxysmal positional vertigo of left ear   3.  Tinnitus of left ear    We discussed etiology and DDX for her vertigo - given prior testing and hx, most consistent with BPPV. D/w pt options and she opted for observation given no sx currently HL and Tinnitus: could be due to chemo regimen, she thinks maybe worsening; will get HT  HT next available; will call with results; if episodes occur again, advised to call back  See below regarding exact medications prescribed this encounter including dosages and route: No orders of the defined types were placed in this encounter.     Thank you for allowing me the opportunity to care for your patient. Please do not hesitate to contact me should you have any other questions.  Sincerely, Milon Aloe, MD Otolaryngologist (ENT), [facility] Phone: [phone] Fax: [phone]  03/18/2024, 3:25 PM   MDM:  Level 4 - 99204 Complexity/Problems addressed: mod - chronic problem, worsening; prior acute problem Data complexity: mod - independent interpretation of imaging; review of notes, labs - Morbidity: low currently  - Prescription Drug prescribed or managed: no

## 2024-03-12 ENCOUNTER — Other Ambulatory Visit: Payer: Self-pay | Admitting: General Surgery

## 2024-03-12 ENCOUNTER — Encounter: Payer: Self-pay | Admitting: Hematology and Oncology

## 2024-03-12 ENCOUNTER — Encounter: Payer: Self-pay | Admitting: Gastroenterology

## 2024-03-12 ENCOUNTER — Telehealth: Payer: Self-pay | Admitting: *Deleted

## 2024-03-12 ENCOUNTER — Encounter: Payer: Self-pay | Admitting: General Practice

## 2024-03-12 ENCOUNTER — Other Ambulatory Visit: Payer: Self-pay | Admitting: *Deleted

## 2024-03-12 ENCOUNTER — Encounter: Payer: Self-pay | Admitting: Adult Health

## 2024-03-12 ENCOUNTER — Inpatient Hospital Stay

## 2024-03-12 ENCOUNTER — Inpatient Hospital Stay (HOSPITAL_BASED_OUTPATIENT_CLINIC_OR_DEPARTMENT_OTHER): Admitting: Hematology and Oncology

## 2024-03-12 ENCOUNTER — Other Ambulatory Visit: Payer: Self-pay | Admitting: Hematology and Oncology

## 2024-03-12 ENCOUNTER — Ambulatory Visit (HOSPITAL_COMMUNITY)
Admission: RE | Admit: 2024-03-12 | Discharge: 2024-03-12 | Disposition: A | Discharge status: Home / Self Care | Source: Ambulatory Visit | Attending: Hematology and Oncology | Admitting: Hematology and Oncology

## 2024-03-12 ENCOUNTER — Telehealth: Payer: Self-pay

## 2024-03-12 VITALS — BP 119/71 | HR 98 | Temp 98.3°F | Resp 17 | Ht 61.0 in | Wt 141.8 lb

## 2024-03-12 DIAGNOSIS — R519 Headache, unspecified: Secondary | ICD-10-CM

## 2024-03-12 DIAGNOSIS — C50311 Malignant neoplasm of lower-inner quadrant of right female breast: Secondary | ICD-10-CM

## 2024-03-12 DIAGNOSIS — R911 Solitary pulmonary nodule: Secondary | ICD-10-CM | POA: Diagnosis not present

## 2024-03-12 DIAGNOSIS — Z171 Estrogen receptor negative status [ER-]: Secondary | ICD-10-CM

## 2024-03-12 DIAGNOSIS — R948 Abnormal results of function studies of other organs and systems: Secondary | ICD-10-CM

## 2024-03-12 LAB — CBC WITH DIFFERENTIAL (CANCER CENTER ONLY)
Abs Immature Granulocytes: 0.01 10*3/uL (ref 0.00–0.07)
Basophils Absolute: 0 10*3/uL (ref 0.0–0.1)
Basophils Relative: 0 %
Eosinophils Absolute: 0 10*3/uL (ref 0.0–0.5)
Eosinophils Relative: 1 %
HCT: 33.4 % — ABNORMAL LOW (ref 36.0–46.0)
Hemoglobin: 11.7 g/dL — ABNORMAL LOW (ref 12.0–15.0)
Immature Granulocytes: 0 %
Lymphocytes Relative: 11 %
Lymphs Abs: 0.5 10*3/uL — ABNORMAL LOW (ref 0.7–4.0)
MCH: 34.6 pg — ABNORMAL HIGH (ref 26.0–34.0)
MCHC: 35 g/dL (ref 30.0–36.0)
MCV: 98.8 fL (ref 80.0–100.0)
Monocytes Absolute: 0.5 10*3/uL (ref 0.1–1.0)
Monocytes Relative: 10 %
Neutro Abs: 3.8 10*3/uL (ref 1.7–7.7)
Neutrophils Relative %: 78 %
Platelet Count: 206 10*3/uL (ref 150–400)
RBC: 3.38 MIL/uL — ABNORMAL LOW (ref 3.87–5.11)
RDW: 15 % (ref 11.5–15.5)
WBC Count: 4.9 10*3/uL (ref 4.0–10.5)
nRBC: 0 % (ref 0.0–0.2)

## 2024-03-12 LAB — CMP (CANCER CENTER ONLY)
ALT: 7 U/L (ref 0–44)
AST: 18 U/L (ref 15–41)
Albumin: 4.6 g/dL (ref 3.5–5.0)
Alkaline Phosphatase: 100 U/L (ref 38–126)
Anion gap: 8 (ref 5–15)
BUN: 13 mg/dL (ref 6–20)
CO2: 24 mmol/L (ref 22–32)
Calcium: 9.3 mg/dL (ref 8.9–10.3)
Chloride: 105 mmol/L (ref 98–111)
Creatinine: 0.54 mg/dL (ref 0.44–1.00)
GFR, Estimated: >60 mL/min (ref >60)
Glucose, Bld: 93 mg/dL (ref 70–99)
Potassium: 3.8 mmol/L (ref 3.5–5.1)
Sodium: 137 mmol/L (ref 135–145)
Total Bilirubin: 1 mg/dL (ref 0.0–1.2)
Total Protein: 7.4 g/dL (ref 6.5–8.1)

## 2024-03-12 MED ORDER — ALPRAZOLAM 0.5 MG PO TABS: 0.5000 mg | ORAL_TABLET | Freq: Three times a day (TID) | ORAL | 1 refills | Status: DC | PRN

## 2024-03-12 MED ORDER — GADOBUTROL 1 MMOL/ML IV SOLN
6.0000 mL | Freq: Once | INTRAVENOUS | Status: AC | PRN
  Administered 2024-03-12: 6 mL via INTRAVENOUS

## 2024-03-12 NOTE — Assessment & Plan Note (Signed)
 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%, HER2 1+  01/25/2023: PET/CT: Right breast cancer, right axilla, retropectoral and right cervical lymph nodes   Treatment plan: Neoadjuvant chemotherapy with Taxol  carbo Keytruda  weekly x 12 followed by Adriamycin  Cytoxan  Keytruda  followed by Keytruda  maintenance until 12/30/2023 07/27/2023: Bilateral mastectomies: Left mastectomy: Benign, right mastectomy: No evidence of residual cancer, 8/20 lymph nodes positive with extranodal extension in 2 lymph nodes Adjuvant radiation therapy 09/08/2023-10/24/2022 Capecitabine  (completed June 2025) with Keytruda  (completed 12/29/2023) --------------------------------------------------------------------------------------- 03/09/2024: PET/CT: 4 mm node right axilla SUV 3.7, right subpectoral activity SUV 3.4 (possible 5 mm node) right apical 7 mm nodule is new compared to 12/23/2023 with an SUV of 2.3, right upper lobe 1 cm nodule new with a SUV of 3.6, left upper lobe nodule 7 mm SUV 4.2, soft tissue fullness proximal duodenum SUV 8.7 (possible duodenitis)  Recommendation: Pulmonary consultation to evaluate a biopsy of the lung findings to confirm if this is infection versus metastatic disease Guardant 360  Counseling: I discussed with the patient extensively that the presence of a nodularity

## 2024-03-12 NOTE — Progress Notes (Signed)
 [facility] Spiritual Care Note  Spoke with Suzanne Tucker in [facility] lobby this morning. She shared tearfully about the spread to her lungs and welcomes follow-up support. We plan to speak by phone later this week.   [address] [name], [place], [facility] Pager [phone] Voicemail [phone]

## 2024-03-12 NOTE — Telephone Encounter (Signed)
  Patient advised that she is scheduled for an EGD in the [facility] on 03-14-24 at 8am with Dr Karene Oto.  Patient agreed to plan and verbalized understanding.

## 2024-03-12 NOTE — Progress Notes (Signed)
 Patient Care Team: Cameron Cea, MD as PCP - General (Hematology and Oncology) Lucendia Rusk, MD as PCP - Cardiology (Cardiology) Verona Goodwill, MD as PCP - Electrophysiology (Cardiology) Cameron Cea, MD as Consulting Physician (Hematology and Oncology) Colie Dawes, MD as Attending Physician (Radiation Oncology) Lockie Rima, MD as Consulting Physician (General Surgery) Alane Hsu, RN as Oncology Nurse Navigator Auther Bo, RN as Oncology Nurse Navigator  DIAGNOSIS:  Encounter Diagnoses  Name Primary?   Malignant neoplasm of lower-inner quadrant of right breast of female, estrogen receptor negative (HCC) Yes   Nonintractable headache, unspecified chronicity pattern, unspecified headache type    Lung nodule     SUMMARY OF ONCOLOGIC HISTORY: Oncology History  Malignant neoplasm of lower-inner quadrant of right breast of female, estrogen receptor negative (HCC)  01/10/2023 Initial Diagnosis   Palpable lump right axilla x 2 weeks bulky axillary lymphadenopathy ultrasound breast: 2 masses at 4 o'clock position 1.1 cm and 0.6 cm multiple axillary lymph nodes largest 4.5 cm also level 2 and 3 (subpectoral lymph node as well): Biopsy: Grade 3 IDC ER 0%, PR 0%, Ki-67 95%, HER2 1+   01/26/2023 Genetic Testing   Negative [employer] Custom Panel (37 genes).  Report date is 01/26/2023.    The [employer] Custom Cancers + RNA Panel includes sequencing, deletion/duplication, and RNA analysis of the following 37 genes: APC, ATM, AXIN2, BARD1, BMPR1A, BRCA1, BRCA2, BRIP1, CDH1, CDK4*, CDKN2A (p14ARF)*, CDKN2A (p16INK4a), CHEK2, CTNNA1, DICER1, EPCAM*, FH, GREM1*, HOXB13*, MBD4*, MLH1, MSH2, MSH3, MSH6, MUTYH, NF1, NTHL1, PALB2, PMS2, POLD1, POLE, PTEN, RAD51C, RAD51D, SMAD4, SMARCA4, STK11, TP53.  *Genes without RNA analysis.    01/28/2023 - 06/27/2023 Chemotherapy   Patient is on Treatment Plan : BREAST Pembrolizumab  (200) D1 + Carboplatin  (1.5) D1,8,15 + Paclitaxel  (80) D1,8,15 q21d X  4 cycles / Pembrolizumab  (200) D1 + AC D1 q21d x 4 cycles     07/15/2023 -  Chemotherapy   Patient is on Treatment Plan : BREAST Pembrolizumab  (200) q21d x 27 weeks     09/07/2023 - 10/26/2023 Radiation Therapy   First Treatment Date: 2023-09-07 Last Treatment Date: 2023-10-26   Plan Name: CW_R_BO Site: Chest Wall, Right Technique: 3D Mode: Photon Dose Per Fraction: 1.8 Gy Prescribed Dose (Delivered / Prescribed): 25.2 Gy / 25.2 Gy Prescribed Fxs (Delivered / Prescribed): 14 / 14   Plan Name: CW_R_Neck_Ax Site: Chest Wall, Right Technique: 3D Mode: Photon Dose Per Fraction: 1.8 Gy Prescribed Dose (Delivered / Prescribed): 50.4 Gy / 50.4 Gy Prescribed Fxs (Delivered / Prescribed): 28 / 28   Plan Name: CW_R_Bst Site: Chest Wall, Right Technique: 3D Mode: Photon Dose Per Fraction: 1.8 Gy Prescribed Dose (Delivered / Prescribed): 9 Gy / 9 Gy Prescribed Fxs (Delivered / Prescribed): 5 / 5   Plan Name: CW_R Site: Chest Wall, Right Technique: 3D Mode: Photon Dose Per Fraction: 1.8 Gy Prescribed Dose (Delivered / Prescribed): 25.2 Gy / 25.2 Gy Prescribed Fxs (Delivered / Prescribed): 14 / 14       CHIEF COMPLIANT: F/U to discuss PET findings  HISTORY OF PRESENT ILLNESS:  History of Present Illness Suzanne Tucker is a 43 year old female with breast cancer who presents with findings on a recent PET scan suggestive of possible metastatic disease.  The recent PET scan shows several areas of concern with elevated SUV scores, including a left chest wall area (SUV 2.9), a right subpectoral area (SUV 3.4), and a left lung nodule (SUV 2.8). The duodenum shows the highest SUV at  8.7. These findings were not present in the March CT scan.  She was previously on Xeloda , which was discontinued. During the end of this treatment, she experienced vomiting and severe stomach pain for three days, which have since resolved.  She recently had a cold, treated with antibiotics, and  recovered quickly. She expresses concern about the potential for metastatic disease and its impact on her life expectancy and quality of life. She is asymptomatic for brain involvement and is not experiencing significant symptoms affecting daily functioning.     ALLERGIES:  is allergic to morphine; tape; tessalon [benzonatate]; hydrocodone; latex; antihistamines, loratadine-type; codeine; kiwi extract; oxycontin  [oxycodone ]; and tramadol .  MEDICATIONS:  Current Outpatient Medications  Medication Sig Dispense Refill   acetaminophen  (TYLENOL ) 500 MG tablet Take 1,000 mg by mouth every 6 (six) hours as needed for fever or mild pain (pain score 1-3).     calcium  carbonate (TUMS - DOSED IN MG ELEMENTAL CALCIUM ) 500 MG chewable tablet Chew 1,000 mg by mouth as needed for indigestion or heartburn.     diclofenac  Sodium (VOLTAREN ) 1 % GEL Research Patient: Apply 0.5 grams (1 fingertip) to each hand and each foot twice daily for up to 12 weeks 400 g 0   fluticasone  (FLONASE ) 50 MCG/ACT nasal spray Place 2 sprays into both nostrils daily. 16 g 0   lidocaine -prilocaine  (EMLA ) cream Apply to affected area once 30 g 3   Multiple Vitamin (MULTIVITAMIN) tablet Take 1 tablet by mouth daily.     ondansetron  (ZOFRAN ) 8 MG tablet Take 1 tablet (8 mg total) by mouth every 8 (eight) hours as needed for nausea or vomiting. 30 tablet 2   No current facility-administered medications for this visit.    PHYSICAL EXAMINATION: ECOG PERFORMANCE STATUS: 1 - Symptomatic but completely ambulatory  Vitals:   03/12/24 1144  BP: 119/71  Pulse: 98  Resp: 17  Temp: 98.3 F (36.8 C)  SpO2: 100%   Filed Weights   03/12/24 1144  Weight: 141 lb 12.8 oz (64.3 kg)       LABORATORY DATA:  I have reviewed the data as listed    Latest Ref Rng & Units 12/29/2023    1:05 PM 12/08/2023   10:47 AM 11/18/2023    9:11 AM  CMP  Glucose 70 - 99 mg/dL 295  93  88   BUN 6 - 20 mg/dL 11  9  12    Creatinine 0.44 - 1.00 mg/dL 2.84   1.32  4.40   Sodium 135 - 145 mmol/L 139  137  136   Potassium 3.5 - 5.1 mmol/L 3.5  3.9  4.2   Chloride 98 - 111 mmol/L 105  105  103   CO2 22 - 32 mmol/L 29  28  28    Calcium  8.9 - 10.3 mg/dL 9.0  9.0  9.3   Total Protein 6.5 - 8.1 g/dL 6.9  6.9  7.0   Total Bilirubin 0.0 - 1.2 mg/dL 0.6  0.6  0.5   Alkaline Phos 38 - 126 U/L 75  67  62   AST 15 - 41 U/L 14  15  13    ALT 0 - 44 U/L 8  7  7      Lab Results  Component Value Date   WBC 4.9 03/12/2024   HGB 11.7 (L) 03/12/2024   HCT 33.4 (L) 03/12/2024   MCV 98.8 03/12/2024   PLT 206 03/12/2024   NEUTROABS 3.8 03/12/2024    ASSESSMENT & PLAN:  Malignant neoplasm of  lower-inner quadrant of right breast of female, estrogen receptor negative (HCC) 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%, HER2 1+  01/25/2023: PET/CT: Right breast cancer, right axilla, retropectoral and right cervical lymph nodes   Treatment plan: Neoadjuvant chemotherapy with Taxol  carbo Keytruda  weekly x 12 followed by Adriamycin  Cytoxan  Keytruda  followed by Keytruda  maintenance until 12/30/2023 07/27/2023: Bilateral mastectomies: Left mastectomy: Benign, right mastectomy: No evidence of residual cancer, 8/20 lymph nodes positive with extranodal extension in 2 lymph nodes Adjuvant radiation therapy 09/08/2023-10/24/2022 Capecitabine  (completed June 2025) with Keytruda  (completed 12/29/2023) --------------------------------------------------------------------------------------- 03/09/2024: PET/CT: 4 mm node right axilla SUV 3.7, right subpectoral activity SUV 3.4 (possible 5 mm node) right apical 7 mm nodule is new compared to 12/23/2023 with an SUV of 2.3, right upper lobe 1 cm nodule new with a SUV of 3.6, left upper lobe nodule 7 mm  SUV 4.2, soft tissue fullness proximal duodenum SUV 8.7 (possible duodenitis)  Recommendation: Pulmonary consultation to evaluate a biopsy   GI consult: requested Guardant 360 Headache: Brain MRI today  Prognosis will be determined down the line  Counseling: I discussed with the patient extensively that until we have a more definitive diagnosis, we cannot do any treatment (discussed Trodelvy and Enhertu) ------------------------------------- Assessment and Plan Assessment & Plan Possible metastatic breast cancer PET scan suggests possible metastasis with lung and chest wall nodules. SUV levels indicate a gray zone. Largest lung nodule SUV 3.6 suggests possible cancer. Differential includes post-surgical changes and inflammation. Biopsy needed for diagnosis. Discussed treatment options Trodelvy and Enhertu, considering HER2 status and side effects. Emphasized disease extent and treatment response for prognosis. Young age and triple-negative status are unfavorable; good functional status and no brain metastases are favorable. - Order lung nodule biopsy via bronchoscopy. - Consult pulmonologist for biopsy. - Order stat brain MRI for metastases assessment. - Order Gardent 360 blood test. - Discuss Chaya Cord and Enhertu based on biopsy. - Consider palliative care referral.  Duodenitis High SUV of 8.7 in duodenum suggests duodenitis or inflammation. Possible Xeloda  side effect. Differential includes rare metastatic disease. - Refer to gastroenterologist for endoscopy.  RTC in 2 weeks   Orders Placed This Encounter  Procedures   MR Brain W Wo Contrast    Standing Status:   Future    Expected Date:   03/12/2024    Expiration Date:   03/12/2025    If indicated for the ordered procedure, I authorize the administration of contrast media per Radiology protocol:   Yes    What is the patient's sedation requirement?:   No Sedation    Does the patient have a pacemaker or implanted devices?:   No     Use SRS Protocol?:   No    Preferred imaging location?:   [facility] (table limit - 550 lbs)    Release to patient:   Immediate   Guardant 360    Standing Status:   Future    Expiration Date:   03/12/2025   Ambulatory referral to Pulmonology    Referral Priority:   Routine    Referral Type:   Consultation    Referral Reason:   Specialty Services Required    Requested Specialty:   Pulmonary Disease    Number of Visits Requested:  1   The patient has a good understanding of the overall plan. she agrees with it. she will call with any problems that may develop before the next visit here. Total time spent: 45 mins including face to face time and time spent for planning, charting and co-ordination of care   Margert Sheerer, MD 03/12/24

## 2024-03-12 NOTE — Telephone Encounter (Signed)
 Per Dr. Gudena, recommended that pt come in to discuss options for treatment. Pt agreed to come in at noon.

## 2024-03-12 NOTE — Telephone Encounter (Signed)
 Per Dr. Lee Public, called pt to make aware that her MRI of the brain was normal. Pt verbalized understanding and was appreciative. Reminded pt to pick up rx from pharm. Pt verbalized understanding.

## 2024-03-13 ENCOUNTER — Telehealth: Payer: Self-pay | Admitting: Emergency Medicine

## 2024-03-13 ENCOUNTER — Encounter: Payer: Self-pay | Admitting: Emergency Medicine

## 2024-03-13 DIAGNOSIS — R918 Other nonspecific abnormal finding of lung field: Secondary | ICD-10-CM | POA: Insufficient documentation

## 2024-03-13 NOTE — Telephone Encounter (Signed)
 Need to call to discuss possible Nav Bronch to eval pulm nodules

## 2024-03-13 NOTE — Telephone Encounter (Signed)
 I spoke with the patient by phone today.  Reviewed her PET scan and the recommendation that we obtain pulmonary nodule biopsies.  Explained navigational bronchoscopy, rationale, risks, benefits.  She understands and agrees.  I would like to try to get this scheduled on 03/19/2024.  Will place case request now.    Please schedule the following:  Provider performing procedure: Alexis Reber Diagnosis: Bilateral pulmonary nodules Which side if for nodule / mass?  Bilateral Procedure: Robotic assisted navigational bronchoscopy Has patient been spoken to by Provider and given informed consent?  Yes Anesthesia: Enteral Do you need Fluro?  Yes Duration of procedure: 60 minutes Date: 03/19/2024 Alternate Date: 03/20/2024 Time: Any Location: [facility] Does patient have OSA?  No DM?  No or Latex allergy?  Yes (and to tape) Medication Restriction/ Anticoagulate/Antiplatelet: None Pre-op Labs Ordered:determined by Anesthesia Imaging request: She needs a super D CT chest.  Will order now (If, SuperDimension CT Chest, please have STAT courier sent to ENDO)

## 2024-03-13 NOTE — Telephone Encounter (Signed)
 Patient is aware of procedure and ct appt

## 2024-03-14 ENCOUNTER — Other Ambulatory Visit: Payer: Self-pay

## 2024-03-14 ENCOUNTER — Ambulatory Visit: Admitting: Student in an Organized Health Care Education/Training Program

## 2024-03-14 ENCOUNTER — Other Ambulatory Visit (HOSPITAL_COMMUNITY): Payer: Self-pay

## 2024-03-14 ENCOUNTER — Telehealth: Admitting: Hematology and Oncology

## 2024-03-14 ENCOUNTER — Other Ambulatory Visit: Payer: Self-pay | Admitting: Gastroenterology

## 2024-03-14 ENCOUNTER — Encounter: Payer: Self-pay | Admitting: Hematology and Oncology

## 2024-03-14 ENCOUNTER — Telehealth: Payer: Self-pay

## 2024-03-14 ENCOUNTER — Encounter: Payer: Self-pay | Admitting: Gastroenterology

## 2024-03-14 ENCOUNTER — Encounter: Payer: Self-pay | Admitting: Adult Health

## 2024-03-14 ENCOUNTER — Ambulatory Visit: Admitting: Gastroenterology

## 2024-03-14 ENCOUNTER — Encounter (HOSPITAL_COMMUNITY): Payer: Self-pay | Admitting: General Surgery

## 2024-03-14 VITALS — BP 145/86 | HR 86 | Temp 97.9°F | Resp 12 | Ht 61.0 in | Wt 141.0 lb

## 2024-03-14 DIAGNOSIS — K31A19 Gastric intestinal metaplasia without dysplasia, unspecified site: Secondary | ICD-10-CM | POA: Diagnosis present

## 2024-03-14 DIAGNOSIS — K269 Duodenal ulcer, unspecified as acute or chronic, without hemorrhage or perforation: Secondary | ICD-10-CM

## 2024-03-14 DIAGNOSIS — K3189 Other diseases of stomach and duodenum: Secondary | ICD-10-CM | POA: Diagnosis not present

## 2024-03-14 DIAGNOSIS — R948 Abnormal results of function studies of other organs and systems: Secondary | ICD-10-CM

## 2024-03-14 MED ORDER — SUCRALFATE 1 GM/10ML PO SUSP: 1.0000 g | Freq: Four times a day (QID) | ORAL | 0 refills | Status: DC

## 2024-03-14 MED ORDER — PANTOPRAZOLE SODIUM 40 MG PO TBEC: 40.0000 mg | DELAYED_RELEASE_TABLET | Freq: Two times a day (BID) | ORAL | 3 refills | Status: AC

## 2024-03-14 MED ORDER — SODIUM CHLORIDE 0.9 % IV SOLN: 500.0000 mL | INTRAVENOUS | Status: DC

## 2024-03-14 NOTE — Telephone Encounter (Signed)
 PA request has been Started. New Encounter has been or will be created for follow up. For additional info see Pharmacy Prior Auth telephone encounter from 03-14-2024.

## 2024-03-14 NOTE — Progress Notes (Signed)
 SDW CALL  Patient was given pre-op instructions over the phone. The opportunity was given for the patient to ask questions. No further questions asked. Patient verbalized understanding of instructions given.   PCP - Alwin Baars Oncologist - Dr. Lee Public Cardiologist - Dr. Richardo Chandler  PPM/ICD - denies   Chest x-ray - chest ct -12/23/23 EKG - 08/15/23 Stress Test -  ECHO - 04/19/23 Cardiac Cath -   Sleep Study - mild OSA but does not wear CPAP  No DM  Last dose of GLP1 agonist-  n/a GLP1 instructions:  n/a  Blood Thinner Instructions: n/a Aspirin Instructions: n/a  ERAS Protcol - clears until 0900 PRE-SURGERY Ensure or G2-  n/a  COVID TEST-  n/a   Anesthesia review:  yes - mitral valve prolapse  Patient denies shortness of breath, fever, cough and chest pain over the phone call   All instructions explained to the patient, with a verbal understanding of the material. Patient agrees to go over the instructions while at home for a better understanding.

## 2024-03-14 NOTE — Progress Notes (Signed)
 GASTROENTEROLOGY PROCEDURE H&P NOTE   Primary Care Physician: Cameron Cea, MD    Reason for Procedure:  Abnormal PET scan  Plan:    EGD  Patient is appropriate for endoscopic procedure(s) in the ambulatory ([facility]) setting.  The nature of the procedure, as well as the risks, benefits, and alternatives were carefully and thoroughly reviewed with the patient. Ample time for discussion and questions allowed. The patient understood, was satisfied, and agreed to proceed.     HPI: Suzanne Tucker is a 43 y.o. female who presents for EGD for evaluation of recent abnormal PET scan findings.  She has a history of breast cancer and was seen by Dr. Samuel Crock in the Oncology Clinic on 03/12/2024 and referred for expedited EGD for evaluation of abnormal findings in the duodenum on her recent PET as below.  Previously treated with neoadjuvant chemotherapy, bilateral mastectomies, adjuvant radiation, capecitabine  with Keytruda .  At the end of Xeloda  treatment did have nausea/vomiting and abdominal pain x 3 days, which has since resolved.  Otherwise without any GI symptoms now.  Recent PET scan shows several areas of concern with elevated SUV scores, including a left chest wall area (SUV 2.9), a right subpectoral area (SUV 3.4), and a left lung nodule (SUV 2.8). The duodenum shows the highest SUV at 8.7.   Scheduled for bronchoscopy with Dr. Baldwin Levee as well.  Brain MRI without any evidence of metastatic disease. Past Medical History:  Diagnosis Date   Anginal pain (HCC) 02/2019   related to MVP   Back pain    Breast cancer (HCC)    Depression    Dysrhythmia    MVP   Mitral prolapse    PONV (postoperative nausea and vomiting)    Sleep apnea    mild no cpap indicated    Past Surgical History:  Procedure Laterality Date   BREAST SURGERY Right 2024   Breast biopsy x 2   CESAREAN SECTION  03/30/2008   MASTECTOMY WITH AXILLARY LYMPH NODE DISSECTION Bilateral 07/27/2023   Procedure:  BILATERAL MASTECTOMY WITH RIGHT AXILLARY LYMPH NODE DISSECTION;  Surgeon: Lockie Rima, MD;  Location: [facility];  Service: General;  Laterality: Bilateral;  PEC BLOCK   PORT-A-CATH REMOVAL Left 01/31/2024   Procedure: REMOVAL PORT-A-CATH;  Surgeon: Lockie Rima, MD;  Location: [facility];  Service: General;  Laterality: Left;   PORTACATH PLACEMENT N/A 01/27/2023   Procedure: INSERTION PORT-A-CATH;  Surgeon: Lockie Rima, MD;  Location: [facility];  Service: General;  Laterality: N/A;  leaving accessed   ROBOTIC ASSISTED TOTAL HYSTERECTOMY Bilateral 03/15/2019   Procedure: XI ROBOTIC ASSISTED TOTAL HYSTERECTOMY with Bilateral Salpingectomy;  Surgeon: Ona Bidding, MD;  Location: [facility];  Service: Gynecology;  Laterality: Bilateral;   TUBAL LIGATION  2017    Prior to Admission medications   Medication Sig Start Date End Date Taking? Authorizing Provider  acetaminophen  (TYLENOL ) 500 MG tablet Take 1,000 mg by mouth every 6 (six) hours as needed for fever or mild pain (pain score 1-3).   Yes [provider]  calcium  carbonate (TUMS - DOSED IN MG ELEMENTAL CALCIUM ) 500 MG chewable tablet Chew 1,000 mg by mouth as needed for indigestion or heartburn.   Yes [provider]  ALPRAZolam  (XANAX ) 0.5 MG tablet Take 1 tablet (0.5 mg total) by mouth 3 (three) times daily as needed for anxiety. 03/12/24   Gudena, Vinay, MD  diclofenac  Sodium (VOLTAREN ) 1 % GEL Research Patient: Apply 0.5 grams (1 fingertip) to each  hand and each foot twice daily for up to 12 weeks Patient not taking: Reported on 03/14/2024 09/08/23   Cameron Cea, MD  fluticasone  (FLONASE ) 50 MCG/ACT nasal spray Place 2 sprays into both nostrils daily. 12/02/22   Leath-Warren, Belen Bowers, NP  lidocaine -prilocaine  (EMLA ) cream Apply to affected area once 09/07/23   Gudena, Vinay, MD  Multiple Vitamin (MULTIVITAMIN) tablet Take 1 tablet by mouth daily.    [provider]   ondansetron  (ZOFRAN ) 8 MG tablet Take 1 tablet (8 mg total) by mouth every 8 (eight) hours as needed for nausea or vomiting. 09/08/23   Gudena, Vinay, MD    Current Outpatient Medications  Medication Sig Dispense Refill   acetaminophen  (TYLENOL ) 500 MG tablet Take 1,000 mg by mouth every 6 (six) hours as needed for fever or mild pain (pain score 1-3).     calcium  carbonate (TUMS - DOSED IN MG ELEMENTAL CALCIUM ) 500 MG chewable tablet Chew 1,000 mg by mouth as needed for indigestion or heartburn.     ALPRAZolam  (XANAX ) 0.5 MG tablet Take 1 tablet (0.5 mg total) by mouth 3 (three) times daily as needed for anxiety. 30 tablet 1   diclofenac  Sodium (VOLTAREN ) 1 % GEL Research Patient: Apply 0.5 grams (1 fingertip) to each hand and each foot twice daily for up to 12 weeks (Patient not taking: Reported on 03/14/2024) 400 g 0   fluticasone  (FLONASE ) 50 MCG/ACT nasal spray Place 2 sprays into both nostrils daily. 16 g 0   lidocaine -prilocaine  (EMLA ) cream Apply to affected area once 30 g 3   Multiple Vitamin (MULTIVITAMIN) tablet Take 1 tablet by mouth daily.     ondansetron  (ZOFRAN ) 8 MG tablet Take 1 tablet (8 mg total) by mouth every 8 (eight) hours as needed for nausea or vomiting. 30 tablet 2   Current Facility-Administered Medications  Medication Dose Route Frequency Provider Last Rate Last Admin   0.9 %  sodium chloride  infusion  500 mL Intravenous Continuous Shavy Beachem V, DO        Allergies as of 03/14/2024 - Review Complete 03/14/2024  Allergen Reaction Noted   Antihistamines, loratadine-type Other (See Comments) 01/26/2023   Kiwi extract Swelling and Other (See Comments) 08/10/2023   Morphine Anaphylaxis and Shortness Of Breath 08/16/2016   Oxycontin  [oxycodone ] Other (See Comments) 08/10/2023   Tape Other (See Comments) 08/10/2023   Tessalon [benzonatate] Hives, Itching, and Other (See Comments) 04/23/2019   Tramadol  Swelling and Other (See Comments) 06/18/2019   Codeine Nausea  And Vomiting 03/08/2019   Hydrocodone Hives 08/16/2016   Latex Rash 08/16/2016    Family History  Problem Relation Age of Onset   Stroke Mother    Alzheimer's disease Father    Diabetes Maternal Grandmother    Heart disease Maternal Grandmother    Heart attack Maternal Grandmother    Diabetes Maternal Grandfather    Heart disease Maternal Grandfather    Alzheimer's disease Paternal Grandmother    Dementia Paternal Grandmother    Alzheimer's disease Paternal Grandfather    Dementia Paternal Grandfather    Alcohol abuse Paternal Grandfather     Social History   Socioeconomic History   Marital status: Married    Spouse name: Not on file   Number of children: Not on file   Years of education: Not on file   Highest education level: Master's degree (e.g., MA, MS, MEng, MEd, MSW, MBA)  Occupational History   Not on file  Tobacco Use   Smoking status: Never   Smokeless  tobacco: Never  Vaping Use   Vaping status: Never Used  Substance and Sexual Activity   Alcohol use: Not Currently    Comment:     Drug use: No   Sexual activity: Yes    Birth control/protection: Surgical  Other Topics Concern   Not on file  Social History Narrative   Not on file   Social Drivers of Health   Financial Resource Strain: Low Risk  (12/27/2022)   Overall Financial Resource Strain (CARDIA)    Difficulty of Paying Living Expenses: Not very hard  Food Insecurity: No Food Insecurity (08/24/2023)   Hunger Vital Sign    Worried About Running Out of Food in the Last Year: Never true    Ran Out of Food in the Last Year: Never true  Transportation Needs: No Transportation Needs (08/24/2023)   PRAPARE - Administrator, Civil Service (Medical): No    Lack of Transportation (Non-Medical): No  Physical Activity: Insufficiently Active (12/27/2022)   Exercise Vital Sign    Days of Exercise per Week: 4 days    Minutes of Exercise per Session: 20 min  Stress: No Stress Concern Present  (12/27/2022)   [employer] of Occupational Health - Occupational Stress Questionnaire    Feeling of Stress : Only a little  Social Connections: Moderately Isolated (12/27/2022)   Social Connection and Isolation Panel [NHANES]    Frequency of Communication with Friends and Family: More than three times a week    Frequency of Social Gatherings with Friends and Family: Once a week    Attends Religious Services: Never    Database administrator or Organizations: No    Attends Engineer, structural: Not on file    Marital Status: Married  Catering manager Violence: Not At Risk (08/24/2023)   Humiliation, Afraid, Rape, and Kick questionnaire    Fear of Current or Ex-Partner: No    Emotionally Abused: No    Physically Abused: No    Sexually Abused: No    Physical Exam: Vital signs in last 24 hours: @BP  120/79   Pulse 88   Temp 97.9 F (36.6 C)   Ht 5' 1 (1.549 m)   Wt 141 lb (64 kg)   SpO2 100%   BMI 26.64 kg/m  GEN: NAD EYE: Sclerae anicteric ENT: MMM CV: Non-tachycardic Pulm: CTA b/l GI: Soft, NT/ND NEURO:  Alert & Oriented x 3   Harry Lindau, DO [facility]   03/14/2024 9:12 AM

## 2024-03-14 NOTE — Op Note (Signed)
 [facility] Patient Name: Suzanne Tucker Procedure Date: 03/14/2024 8:59 AM MRN: [id number] Endoscopist: Harry Lindau , MD, [id number] Age: 43 Referring MD:  Date of Birth: [birth year] Gender: Female Account #: [id number] Procedure:                Upper GI endoscopy Indications:              Abnormal PET scan of the GI tract Medicines:                Monitored Anesthesia Care Procedure:                Pre-Anesthesia Assessment:                           - Prior to the procedure, a History and Physical                            was performed, and patient medications and                            allergies were reviewed. The patient's tolerance of                            previous anesthesia was also reviewed. The risks                            and benefits of the procedure and the sedation                            options and risks were discussed with the patient.                            All questions were answered, and informed consent                            was obtained. Prior Anticoagulants: The patient has                            taken no anticoagulant or antiplatelet agents. ASA                            Grade Assessment: III - A patient with severe                            systemic disease. After reviewing the risks and                            benefits, the patient was deemed in satisfactory                            condition to undergo the procedure.                           After obtaining informed consent, the endoscope was  passed under direct vision. Throughout the                            procedure, the patient's blood pressure, pulse, and                            oxygen saturations were monitored continuously. The                            GIF [id number] #[id number] was introduced through the                            mouth, and advanced to the third part of duodenum.                            The upper GI  endoscopy was accomplished without                            difficulty. The patient tolerated the procedure                            well. Scope In: Scope Out: Findings:                 The examined esophagus was normal.                           The entire examined stomach was normal.                           The mucosa was moderately erythematous with a few                            non-bleeding cratered ulcers localized to the                            distal duodenal bulb. Biopsies were taken with a                            cold forceps for histology. Estimated blood loss                            was minimal.                           The second portion of the duodenum and third                            portion of the duodenum were normal. Complications:            No immediate complications. Estimated Blood Loss:     Estimated blood loss was minimal. Impression:               - Normal esophagus.                           -  Normal stomach.                           - Non-bleeding duodenal ulcers. Biopsied.                           - Normal second portion of the duodenum and third                            portion of the duodenum. Recommendation:           - Patient has a contact number available for                            emergencies. The signs and symptoms of potential                            delayed complications were discussed with the                            patient. Return to normal activities tomorrow.                            Written discharge instructions were provided to the                            patient.                           - Resume previous diet.                           - Use Protonix  (pantoprazole ) 40 mg PO BID for 8                            weeks, then reduce to 40 mg daily.                           - Use sucralfate tablets 1 gram PO QID for 2 weeks.                            If needed, can dissolve tablet in 10 mL of water                              and drink as a solution/slurry.                           - Await pathology results.                           - Depending on pathology results and upcoming                            evaluation with Pulmonary and Oncology follow-up,  tentatively plan for repeat EGD in 8 weeks to                            re-evaluate.                           - Follow-up in the GI Clinic in 4-8 weeks or sooner                            as needed. Harry Lindau, MD 03/14/2024 9:47:54 AM

## 2024-03-14 NOTE — Telephone Encounter (Signed)
 Please see note below and advise

## 2024-03-14 NOTE — Patient Instructions (Signed)
  Resume previous diet  Continue present medications  Awaiting pathology results   YOU HAD AN ENDOSCOPIC PROCEDURE TODAY AT THE [facility]:   Refer to the procedure report that was given to you for any specific questions about what was found during the examination.  If the procedure report does not answer your questions, please call your gastroenterologist to clarify.  If you requested that your care partner not be given the details of your procedure findings, then the procedure report has been included in a sealed envelope for you to review at your convenience later.  YOU SHOULD EXPECT: Some feelings of bloating in the abdomen. Passage of more gas than usual.  Walking can help get rid of the air that was put into your GI tract during the procedure and reduce the bloating. If you had a lower endoscopy (such as a colonoscopy or flexible sigmoidoscopy) you may notice spotting of blood in your stool or on the toilet paper. If you underwent a bowel prep for your procedure, you may not have a normal bowel movement for a few days.  Please Note:  You might notice some irritation and congestion in your nose or some drainage.  This is from the oxygen used during your procedure.  There is no need for concern and it should clear up in a day or so.  SYMPTOMS TO REPORT IMMEDIATELY:  Following upper endoscopy (EGD)  Vomiting of blood or coffee ground material  New chest pain or pain under the shoulder blades  Painful or persistently difficult swallowing  New shortness of breath  Fever of 100F or higher  Black, tarry-looking stools  For urgent or emergent issues, a gastroenterologist can be reached at any hour by calling [phone]. Do not use MyChart messaging for urgent concerns.    DIET:  We do recommend a small meal at first, but then you may proceed to your regular diet.  Drink plenty of fluids but you should avoid alcoholic beverages for 24 hours.  ACTIVITY:  You should plan to  take it easy for the rest of today and you should NOT DRIVE or use heavy machinery until tomorrow (because of the sedation medicines used during the test).    FOLLOW UP: Our staff will call the number listed on your records the next business day following your procedure.  We will call around 7:15- 8:00 am to check on you and address any questions or concerns that you may have regarding the information given to you following your procedure. If we do not reach you, we will leave a message.     If any biopsies were taken you will be contacted by phone or by letter within the next 1-3 weeks.  Please call us at [phone] if you have not heard about the biopsies in 3 weeks.    SIGNATURES/CONFIDENTIALITY: You and/or your care partner have signed paperwork which will be entered into your electronic medical record.  These signatures attest to the fact that that the information above on your After Visit Summary has been reviewed and is understood.  Full responsibility of the confidentiality of this discharge information lies with you and/or your care-partner.

## 2024-03-14 NOTE — Progress Notes (Signed)
 Pre-op call completed for both procedures on 6/12 and 6/16.  Patient aware that for procedure on 6/16 she will need to be NPO at midnight.

## 2024-03-14 NOTE — Progress Notes (Signed)
 Sedate, gd SR, tolerated procedure well, VSS, report to RN

## 2024-03-14 NOTE — Telephone Encounter (Signed)
 Pharmacy Patient Advocate Encounter   Received notification from RX Request Messages that prior authorization for sucralfate (CARAFATE) 1 GM/10ML suspension is required/requested.   Insurance verification completed.   The patient is insured through [facility] ADVANT[employer]/RX ADVANCE .   Per test claim:  Sucralfate TABLET is preferred by the insurance.  If suggested medication is appropriate, Please send in a new RX and discontinue this one. If not, please advise as to why it's not appropriate so that we may request a Prior Authorization. Please note, some preferred medications may still require a PA.  If the suggested medications have not been trialed and there are no contraindications to their use, the PA will not be submitted, as it will not be approved.

## 2024-03-15 ENCOUNTER — Ambulatory Visit
Admission: RE | Admit: 2024-03-15 | Discharge: 2024-03-15 | Disposition: A | Discharge status: Home / Self Care | Source: Ambulatory Visit | Attending: Emergency Medicine | Admitting: Emergency Medicine

## 2024-03-15 ENCOUNTER — Ambulatory Visit (HOSPITAL_COMMUNITY): Admit: 2024-03-15 | Admitting: General Surgery

## 2024-03-15 ENCOUNTER — Telehealth: Payer: Self-pay

## 2024-03-15 DIAGNOSIS — R918 Other nonspecific abnormal finding of lung field: Secondary | ICD-10-CM

## 2024-03-15 SURGERY — INSERTION, TUNNELED CENTRAL VENOUS DEVICE, WITH PORT
Anesthesia: General

## 2024-03-15 NOTE — Telephone Encounter (Signed)
 Refer back to [employer] from yesterday. Medication not needed at this time.

## 2024-03-15 NOTE — Telephone Encounter (Signed)
  Follow up Call-     03/14/2024    8:03 AM  Call back number  Post procedure Call Back phone  # [phone]  Permission to leave phone message Yes     Patient questions:  Do you have a fever, pain , or abdominal swelling? No. Pain Score  0 *  Have you tolerated food without any problems? Yes.    Have you been able to return to your normal activities? Yes.    Do you have any questions about your discharge instructions: Diet   No. Medications  Yes.  Pt. Sent msg to Dr. concerning potential Rx allergy per pharmacist Follow up visit  No.  Do you have questions or concerns about your Care? No.  Actions: * If pain score is 4 or above: No action needed, pain <4.

## 2024-03-16 ENCOUNTER — Other Ambulatory Visit: Payer: Self-pay

## 2024-03-16 ENCOUNTER — Ambulatory Visit: Payer: Self-pay | Admitting: Gastroenterology

## 2024-03-16 ENCOUNTER — Encounter: Payer: Self-pay | Admitting: *Deleted

## 2024-03-16 NOTE — Progress Notes (Signed)
 Received message from Guardant Reveal team stating pt will be scheduled after 04/03/24 for Reveal test.  States that pt recently had Guardant 360 drawn and for insurance purposes, tests must be 28 days apart.

## 2024-03-18 NOTE — Anesthesia Preprocedure Evaluation (Signed)
 Anesthesia Evaluation  Patient identified by MRN, date of birth, ID band Patient awake    Reviewed: Allergy & Precautions, NPO status , Patient's Chart, lab work & pertinent test results  History of Anesthesia Complications (+) PONV and history of anesthetic complications  Airway Mallampati: II  TM Distance: >3 FB Neck ROM: Full    Dental  (+) Dental Advisory Given, Teeth Intact   Pulmonary sleep apnea    Pulmonary exam normal        Cardiovascular Normal cardiovascular exam+ Valvular Problems/Murmurs MVP      Neuro/Psych  PSYCHIATRIC DISORDERS  Depression     Neuromuscular disease    GI/Hepatic negative GI ROS, Neg liver ROS,,,  Endo/Other  negative endocrine ROS    Renal/GU negative Renal ROS     Musculoskeletal negative musculoskeletal ROS (+)    Abdominal   Peds  Hematology  (+) Blood dyscrasia, anemia   Anesthesia Other Findings   Reproductive/Obstetrics  s/p hysterectomy Breast cancer                              Anesthesia Physical Anesthesia Plan  ASA: 2  Anesthesia Plan: General   Post-op Pain Management: Tylenol  PO (pre-op)* and Minimal or no pain anticipated   Induction: Intravenous  PONV Risk Score and Plan: 4 or greater and Treatment may vary due to age or medical condition, Ondansetron , Dexamethasone , Midazolam  and Scopolamine  patch - Pre-op  Airway Management Planned: Oral ETT  Additional Equipment: None  Intra-op Plan:   Post-operative Plan: Extubation in OR  Informed Consent: I have reviewed the patients History and Physical, chart, labs and discussed the procedure including the risks, benefits and alternatives for the proposed anesthesia with the patient or authorized representative who has indicated his/her understanding and acceptance.     Dental advisory given  Plan Discussed with: CRNA and Anesthesiologist  Anesthesia Plan Comments:         Anesthesia Quick Evaluation

## 2024-03-19 ENCOUNTER — Ambulatory Visit (HOSPITAL_COMMUNITY)
Admission: RE | Admit: 2024-03-19 | Discharge: 2024-03-19 | Disposition: A | Discharge status: Home / Self Care | Attending: Emergency Medicine | Admitting: Emergency Medicine

## 2024-03-19 ENCOUNTER — Encounter (HOSPITAL_COMMUNITY): Payer: Self-pay | Admitting: Emergency Medicine

## 2024-03-19 ENCOUNTER — Encounter (HOSPITAL_COMMUNITY)
Admission: RE | Disposition: A | Discharge status: Home / Self Care | Payer: Self-pay | Source: Home / Self Care | Attending: Emergency Medicine

## 2024-03-19 ENCOUNTER — Ambulatory Visit (HOSPITAL_COMMUNITY): Payer: Self-pay | Admitting: Anesthesiology

## 2024-03-19 ENCOUNTER — Ambulatory Visit (HOSPITAL_COMMUNITY)

## 2024-03-19 ENCOUNTER — Other Ambulatory Visit: Payer: Self-pay

## 2024-03-19 ENCOUNTER — Telehealth: Payer: Self-pay | Admitting: Emergency Medicine

## 2024-03-19 DIAGNOSIS — R918 Other nonspecific abnormal finding of lung field: Secondary | ICD-10-CM | POA: Diagnosis present

## 2024-03-19 DIAGNOSIS — F32A Depression, unspecified: Secondary | ICD-10-CM | POA: Diagnosis not present

## 2024-03-19 DIAGNOSIS — G4730 Sleep apnea, unspecified: Secondary | ICD-10-CM | POA: Diagnosis not present

## 2024-03-19 DIAGNOSIS — Z853 Personal history of malignant neoplasm of breast: Secondary | ICD-10-CM | POA: Insufficient documentation

## 2024-03-19 DIAGNOSIS — J069 Acute upper respiratory infection, unspecified: Secondary | ICD-10-CM

## 2024-03-19 HISTORY — PX: BRONCHOSCOPY, WITH BIOPSY USING ELECTROMAGNETIC NAVIGATION: SHX7536

## 2024-03-19 HISTORY — PX: BRONCHIAL BIOPSY: SHX5109

## 2024-03-19 HISTORY — PX: BRONCHIAL NEEDLE ASPIRATION BIOPSY: SHX5106

## 2024-03-19 HISTORY — PX: BRONCHIAL BRUSHINGS: SHX5108

## 2024-03-19 SURGERY — BRONCHOSCOPY, WITH BIOPSY USING ELECTROMAGNETIC NAVIGATION
Anesthesia: General | Laterality: Bilateral

## 2024-03-19 MED ORDER — FENTANYL CITRATE (PF) 100 MCG/2ML IJ SOLN
INTRAMUSCULAR | Status: AC
  Filled 2024-03-19: qty 2

## 2024-03-19 MED ORDER — PROPOFOL 10 MG/ML IV BOLUS: INTRAVENOUS | Status: DC | PRN

## 2024-03-19 MED ORDER — CHLORHEXIDINE GLUCONATE 0.12 % MT SOLN
15.0000 mL | Freq: Once | OROMUCOSAL | Status: AC
  Administered 2024-03-19: 15 mL via OROMUCOSAL
  Filled 2024-03-19: qty 15

## 2024-03-19 MED ORDER — DEXAMETHASONE SODIUM PHOSPHATE 10 MG/ML IJ SOLN: INTRAMUSCULAR | Status: DC | PRN

## 2024-03-19 MED ORDER — MIDAZOLAM HCL 2 MG/2ML IJ SOLN
INTRAMUSCULAR | Status: DC | PRN
Start: 2024-03-19 — End: 2024-03-19

## 2024-03-19 MED ORDER — FENTANYL CITRATE (PF) 250 MCG/5ML IJ SOLN: INTRAMUSCULAR | Status: DC | PRN

## 2024-03-19 MED ORDER — SCOPOLAMINE 1 MG/3DAYS TD PT72
1.0000 | MEDICATED_PATCH | TRANSDERMAL | Status: DC
  Administered 2024-03-19: 1.5 mg via TRANSDERMAL
  Filled 2024-03-19: qty 1

## 2024-03-19 MED ORDER — MIDAZOLAM HCL 2 MG/2ML IJ SOLN
INTRAMUSCULAR | Status: AC
  Filled 2024-03-19: qty 2

## 2024-03-19 MED ORDER — PHENYLEPHRINE HCL-NACL 20-0.9 MG/250ML-% IV SOLN: INTRAVENOUS | Status: DC | PRN

## 2024-03-19 MED ORDER — AMISULPRIDE (ANTIEMETIC) 5 MG/2ML IV SOLN: 10.0000 mg | Freq: Once | INTRAVENOUS | Status: DC | PRN

## 2024-03-19 MED ORDER — FLUTICASONE PROPIONATE 50 MCG/ACT NA SUSP
2.0000 | Freq: Every day | NASAL | Status: AC | PRN
Start: 2024-03-19 — End: ?

## 2024-03-19 MED ORDER — ONDANSETRON HCL 4 MG/2ML IJ SOLN: INTRAMUSCULAR | Status: DC | PRN

## 2024-03-19 MED ORDER — SODIUM CHLORIDE 0.9 % IV SOLN: 12.5000 mg | INTRAVENOUS | Status: DC | PRN

## 2024-03-19 MED ORDER — ROCURONIUM BROMIDE 10 MG/ML (PF) SYRINGE: PREFILLED_SYRINGE | INTRAVENOUS | Status: DC | PRN

## 2024-03-19 MED ORDER — LIDOCAINE 2% (20 MG/ML) 5 ML SYRINGE
INTRAMUSCULAR | Status: DC | PRN
Start: 2024-03-19 — End: 2024-03-19

## 2024-03-19 MED ORDER — SUGAMMADEX SODIUM 200 MG/2ML IV SOLN: INTRAVENOUS | Status: DC | PRN

## 2024-03-19 MED ORDER — ACETAMINOPHEN 500 MG PO TABS
1000.0000 mg | ORAL_TABLET | Freq: Once | ORAL | Status: AC
  Administered 2024-03-19: 1000 mg via ORAL
  Filled 2024-03-19: qty 2

## 2024-03-19 MED ORDER — LACTATED RINGERS IV SOLN: INTRAVENOUS | Status: DC

## 2024-03-19 MED ORDER — FENTANYL CITRATE (PF) 100 MCG/2ML IJ SOLN: 25.0000 ug | INTRAMUSCULAR | Status: DC | PRN

## 2024-03-19 NOTE — H&P (Signed)
 Suzanne Tucker is an 43 y.o. female.   Chief Complaint: Bilateral pulmonary nodules HPI: 43 year old woman with a history of breast cancer.  Surveillance PET scan done 03/12/2024 revealed possible metastatic disease including left chest wall, right subpectoral lesions.  There were also pulmonary nodules present with mild hypermetabolism including 2 in the right upper lobe, 1 in the left upper lobe.  Recommendation was made to achieve tissue diagnosis via robotic assisted navigational bronchoscopy.  The patient understands the risk, benefits, rationale and agrees to proceed.  Questions answered today.  She denies any new issues, no chest pain, no cough, no dyspnea.  Past Medical History:  Diagnosis Date   Anginal pain (HCC) 02/2019   related to MVP   Back pain    Breast cancer (HCC)    Depression    Dysrhythmia    MVP   Mitral prolapse    PONV (postoperative nausea and vomiting)    Sleep apnea    mild no cpap indicated    Past Surgical History:  Procedure Laterality Date   BREAST SURGERY Right 2024   Breast biopsy x 2   CESAREAN SECTION  03/30/2008   MASTECTOMY WITH AXILLARY LYMPH NODE DISSECTION Bilateral 07/27/2023   Procedure: BILATERAL MASTECTOMY WITH RIGHT AXILLARY LYMPH NODE DISSECTION;  Surgeon: Lockie Rima, MD;  Location: [facility];  Service: General;  Laterality: Bilateral;  PEC BLOCK   PORT-A-CATH REMOVAL Left 01/31/2024   Procedure: REMOVAL PORT-A-CATH;  Surgeon: Lockie Rima, MD;  Location: [facility];  Service: General;  Laterality: Left;   PORTACATH PLACEMENT N/A 01/27/2023   Procedure: INSERTION PORT-A-CATH;  Surgeon: Lockie Rima, MD;  Location: [facility];  Service: General;  Laterality: N/A;  leaving accessed   ROBOTIC ASSISTED TOTAL HYSTERECTOMY Bilateral 03/15/2019   Procedure: XI ROBOTIC ASSISTED TOTAL HYSTERECTOMY with Bilateral Salpingectomy;  Surgeon: Ona Bidding, MD;  Location: [facility];  Service:  Gynecology;  Laterality: Bilateral;   TUBAL LIGATION  2017    Family History  Problem Relation Age of Onset   Stroke Mother    Alzheimer's disease Father    Diabetes Maternal Grandmother    Heart disease Maternal Grandmother    Heart attack Maternal Grandmother    Diabetes Maternal Grandfather    Heart disease Maternal Grandfather    Alzheimer's disease Paternal Grandmother    Dementia Paternal Grandmother    Alzheimer's disease Paternal Grandfather    Dementia Paternal Grandfather    Alcohol abuse Paternal Grandfather    Social History:  reports that she has never smoked. She has never used smokeless tobacco. She reports that she does not currently use alcohol. She reports that she does not use drugs.  Allergies:  Allergies  Allergen Reactions   Antihistamines, Loratadine-Type Other (See Comments)    Patient reports numbness of the tongue   Kiwi Extract Swelling and Other (See Comments)    Mouth tingles, too   Morphine Anaphylaxis and Shortness Of Breath   Oxycontin  [Oxycodone ] Other (See Comments)    RPh has recommended this not be taken  (Oxycontin )   Tape Other (See Comments)    NEEDS DRESSINGS FOR SENSITIVE SKIN!!!   Tessalon [Benzonatate] Hives, Itching and Other (See Comments)    Hives, itching, burning all over.    Tramadol  Swelling and Other (See Comments)    Tingling in the mouth with swelling    Codeine Nausea And Vomiting   Hydrocodone Hives   Latex Rash    Medications Prior to Admission  Medication  Sig Dispense Refill   acetaminophen  (TYLENOL ) 500 MG tablet Take 1,000 mg by mouth every 6 (six) hours as needed for fever or mild pain (pain score 1-3).     calcium  carbonate (TUMS - DOSED IN MG ELEMENTAL CALCIUM ) 500 MG chewable tablet Chew 1,000 mg by mouth as needed for indigestion or heartburn.     Dextrose -Fructose-Sod Citrate (NAUZENE) 968-175-230 MG CHEW Chew 1 tablet by mouth daily as needed (Nausea).     fluticasone  (FLONASE ) 50 MCG/ACT nasal spray  Place 2 sprays into both nostrils daily. (Patient taking differently: Place 2 sprays into both nostrils daily as needed for allergies.) 16 g 0   ALPRAZolam  (XANAX ) 0.5 MG tablet Take 1 tablet (0.5 mg total) by mouth 3 (three) times daily as needed for anxiety. 30 tablet 1   diclofenac  Sodium (VOLTAREN ) 1 % GEL Research Patient: Apply 0.5 grams (1 fingertip) to each hand and each foot twice daily for up to 12 weeks (Patient not taking: Reported on 03/14/2024) 400 g 0   lidocaine -prilocaine  (EMLA ) cream Apply to affected area once (Patient not taking: Reported on 03/14/2024) 30 g 3   ondansetron  (ZOFRAN ) 8 MG tablet Take 1 tablet (8 mg total) by mouth every 8 (eight) hours as needed for nausea or vomiting. (Patient not taking: Reported on 03/14/2024) 30 tablet 2   pantoprazole  (PROTONIX ) 40 MG tablet Take 1 tablet (40 mg total) by mouth 2 (two) times daily. Protonix  40mg  PO BID for 8 weeks then reduce to 40 mg daily 180 tablet 3   sucralfate  (CARAFATE ) 1 GM/10ML suspension Take 10 mLs (1 g total) by mouth 4 (four) times daily for 14 days. 560 mL 0    No results found for this or any previous visit (from the past 48 hours). No results found.  Review of Systems As per HPI Blood pressure 109/73, pulse 83, temperature 98.3 F (36.8 C), temperature source Oral, resp. rate 16, height 5' 1 (1.549 m), weight 64 kg, SpO2 99%. Physical Exam  Gen: Pleasant, well-nourished, in no distress,  normal affect  ENT: No lesions,  mouth clear,  oropharynx clear, no postnasal drip  Neck: No JVD, no stridor  Lungs: No use of accessory muscles, no crackles or wheezing on normal respiration, no wheeze on forced expiration  Cardiovascular: RRR, heart sounds normal, no murmur or gallops, no peripheral edema  Abdomen: soft and NT, no HSM,  BS normal  Musculoskeletal: No deformities, no cyanosis or clubbing  Neuro: alert, awake, non focal  Skin: Warm, no lesions or rashes    Assessment/Plan Bilateral pulmonary  nodules in a patient with a history of breast cancer.  Plan to perform navigational bronchoscopy to obtain tissue diagnosis.  Patient understands the risks and benefits and elected proceed.  No barriers identified.  Denson Flake, MD 03/19/2024, 7:23 AM

## 2024-03-19 NOTE — Anesthesia Postprocedure Evaluation (Signed)
 Anesthesia Post Note  Patient: Kyrstin Alexandra Adelson  Procedure(s) Performed: BRONCHOSCOPY, WITH BIOPSY USING ELECTROMAGNETIC NAVIGATION (Bilateral) BRONCHOSCOPY, WITH BIOPSY BRONCHOSCOPY, WITH NEEDLE ASPIRATION BIOPSY BRONCHOSCOPY, WITH BRUSH BIOPSY     Patient location during evaluation: PACU Anesthesia Type: General Level of consciousness: awake and alert Pain management: pain level controlled Vital Signs Assessment: post-procedure vital signs reviewed and stable Respiratory status: spontaneous breathing, nonlabored ventilation and respiratory function stable Cardiovascular status: stable and blood pressure returned to baseline Anesthetic complications: no   No notable events documented.  Last Vitals:  Vitals:   03/19/24 0900 03/19/24 0915  BP: 111/65 112/70  Pulse: 82 80  Resp: 14 (!) 22  Temp:    SpO2: 100% 100%    Last Pain:  Vitals:   03/19/24 0915  TempSrc:   PainSc: 0-No pain                 Juventino Oppenheim

## 2024-03-19 NOTE — Discharge Instructions (Addendum)

## 2024-03-19 NOTE — Op Note (Signed)
 Video Bronchoscopy with Robotic Assisted Bronchoscopic Navigation   Date of Operation: 03/19/2024   Pre-op Diagnosis: Bilateral pulmonary nodules  Post-op Diagnosis: Same  Surgeon: Racheal Buddle  Assistants: None  Anesthesia: General endotracheal anesthesia  Operation: Flexible video fiberoptic bronchoscopy with robotic assistance and biopsies.  Estimated Blood Loss: Minimal  Complications: None  Indications and History: Suzanne Tucker is a 43 y.o. female with history of breast cancer.  A surveillance PET scan showed suspicious bilateral pulmonary nodules.  Recommendation made to achieve a tissue diagnosis via robotic assisted navigational bronchoscopy.  The risks, benefits, complications, treatment options and expected outcomes were discussed with the patient.  The possibilities of pneumothorax, pneumonia, reaction to medication, pulmonary aspiration, perforation of a viscus, bleeding, failure to diagnose a condition and creating a complication requiring transfusion or operation were discussed with the patient who freely signed the consent.    Description of Procedure: The patient was seen in the Preoperative Area, was examined and was deemed appropriate to proceed.  The patient was taken to [facility]oscopy room 3, identified as Suzanne Tucker and the procedure verified as Flexible Video Fiberoptic Bronchoscopy.  A Time Out was held and the above information confirmed.   Prior to the date of the procedure a high-resolution CT scan of the chest was performed. Utilizing ION software program a virtual tracheobronchial tree was generated to allow the creation of distinct navigation pathways to the patient's parenchymal abnormalities. After being taken to the operating room general anesthesia was initiated and the patient  was orally intubated. The video fiberoptic bronchoscope was introduced via the endotracheal tube and a general inspection was performed which showed normal  right and left lung anatomy. Aspiration of the bilateral mainstems was completed to remove any remaining secretions. Robotic catheter inserted into patient's endotracheal tube.   Target #1 right upper lobe medial pulmonary nodule: The distinct navigation pathways prepared prior to this procedure were then utilized to navigate to patient's lesion identified on CT scan. The robotic catheter was secured into place and the vision probe was withdrawn.  Lesion location was approximated using fluoroscopy.  Local registration and targeting was performed using [employer] Cios mobile C-arm three-dimensional imaging. Under fluoroscopic guidance transbronchial needle brushings, transbronchial needle biopsies, and transbronchial forceps biopsies were performed to be sent for cytology and pathology.  Needle-in-lesion was confirmed using Cios mobile C-arm.    Target #2 right upper lobe superior pulmonary nodule: The distinct navigation pathways prepared prior to this procedure were then utilized to navigate to patient's lesion identified on CT scan. The robotic catheter was secured into place and the vision probe was withdrawn.  Lesion location was approximated using fluoroscopy.  Local registration and targeting was performed using [employer] Cios mobile C-arm three-dimensional imaging. Under fluoroscopic guidance transbronchial needle brushings, transbronchial needle biopsies were performed to be sent for cytology and pathology.    At the end of the procedure a general airway inspection was performed and there was no evidence of active bleeding. The bronchoscope was removed.  The patient tolerated the procedure well. There was no significant blood loss and there were no obvious complications. A post-procedural chest x-ray is pending.  Samples Target #1: 1. Transbronchial needle brushings from right upper lobe nodule (medial) 2. Transbronchial Wang needle biopsies from right upper lobe nodule 3.  Transbronchial forceps biopsies from right upper lobe nodule  Samples Target #2: 1. Transbronchial needle brushings from right upper lobe nodule (superior) 2. Transbronchial Wang needle biopsies from right upper lobe nodule  Plans:  The patient will be discharged from the PACU to home when recovered from anesthesia and after chest x-ray is reviewed. We will review the cytology, pathology and microbiology results with the patient when they become available. Outpatient followup will be with Dr Gudena, Dr Baldwin Levee.   Racheal Buddle, MD, PhD 03/19/2024, 8:41 AM [facility] [phone] or if no answer before 7:00PM call [phone] For any issues after 7:00PM please call eLink [phone]

## 2024-03-19 NOTE — Transfer of Care (Signed)
 Immediate Anesthesia Transfer of Care Note  Patient: Suzanne Tucker  Procedure(s) Performed: BRONCHOSCOPY, WITH BIOPSY USING ELECTROMAGNETIC NAVIGATION (Bilateral) BRONCHOSCOPY, WITH BIOPSY BRONCHOSCOPY, WITH NEEDLE ASPIRATION BIOPSY BRONCHOSCOPY, WITH BRUSH BIOPSY  Patient Location: PACU  Anesthesia Type:General  Level of Consciousness: awake and patient cooperative  Airway & Oxygen Therapy: Patient Spontanous Breathing and Patient connected to face mask oxygen  Post-op Assessment: Report given to RN, Post -op Vital signs reviewed and stable, and Patient moving all extremities X 4  Post vital signs: Reviewed and stable  Last Vitals:  Vitals Value Taken Time  BP 110/62 03/19/24 08:49  Temp    Pulse 73 03/19/24 08:52  Resp 11 03/19/24 08:52  SpO2 95 % 03/19/24 08:52  Vitals shown include unfiled device data.  Last Pain:  Vitals:   03/19/24 0611  TempSrc:   PainSc: 0-No pain         Complications: No notable events documented.

## 2024-03-19 NOTE — Telephone Encounter (Signed)
 Called and spoke with Suzanne Tucker. Pt has been cancelled and is aware. Nfn

## 2024-03-19 NOTE — Anesthesia Procedure Notes (Signed)
 Procedure Name: Intubation Date/Time: 03/19/2024 7:43 AM  Performed by: Linard Reno, CRNAPre-anesthesia Checklist: Patient identified, Emergency Drugs available, Suction available and Patient being monitored Patient Re-evaluated:Patient Re-evaluated prior to induction Oxygen Delivery Method: Circle System Utilized Preoxygenation: Pre-oxygenation with 100% oxygen Induction Type: IV induction Ventilation: Mask ventilation without difficulty and Oral airway inserted - appropriate to patient size Laryngoscope Size: 2 Grade View: Grade II Tube type: Oral Number of attempts: 1 Airway Equipment and Method: Stylet and Oral airway Placement Confirmation: ETT inserted through vocal cords under direct vision, positive ETCO2 and breath sounds checked- equal and bilateral Secured at: 21 cm Tube secured with: Tape Dental Injury: Teeth and Oropharynx as per pre-operative assessment

## 2024-03-19 NOTE — Telephone Encounter (Signed)
 This patient has a follow-up visit with Dr. Lee Public on 6/23.  We can cancel her office visit with Braden Caddy on 6/24.

## 2024-03-20 ENCOUNTER — Other Ambulatory Visit: Payer: Self-pay

## 2024-03-20 ENCOUNTER — Encounter (HOSPITAL_BASED_OUTPATIENT_CLINIC_OR_DEPARTMENT_OTHER): Payer: Self-pay | Admitting: General Surgery

## 2024-03-20 NOTE — Progress Notes (Signed)
   03/20/24 1149  PAT Phone Screen  Is the patient taking a GLP-1 receptor agonist? No  Do You Have Diabetes? No  Do You Have Hypertension? No  Have You Ever Been to the ER for Asthma? No  Have You Taken Oral Steroids in the Past 3 Months? No  Do you Take Phenteramine or any Other Diet Drugs? No  Recent  Lab Work, EKG, CXR? Yes  Where was this test performed? 03/12/24 [facility]  EKG 08/15/23  Cardiologist Name Dr Rodolfo Clan  Have you ever had tests on your heart? Yes  What date/year were cardiac tests completed? 2024 EF 60-65%  Results viewable: [facility] Media Tab  Any Recent Hospitalizations? No  Height 5' 1 (1.549 m)  Weight 64 kg  Pat Appointment Scheduled No  Reason for No Appointment Not Needed

## 2024-03-21 ENCOUNTER — Encounter (HOSPITAL_COMMUNITY): Payer: Self-pay | Admitting: Emergency Medicine

## 2024-03-25 NOTE — Assessment & Plan Note (Signed)
 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%, HER2 1+  01/25/2023: PET/CT: Right breast cancer, right axilla, retropectoral and right cervical lymph nodes   Treatment plan: Neoadjuvant chemotherapy with Taxol  carbo Keytruda  weekly x 12 followed by Adriamycin  Cytoxan  Keytruda  followed by Keytruda  maintenance until 12/30/2023 07/27/2023: Bilateral mastectomies: Left mastectomy: Benign, right mastectomy: No evidence of residual cancer, 8/20 lymph nodes positive with extranodal extension in 2 lymph nodes Adjuvant radiation therapy 09/08/2023-10/24/2022 Capecitabine  (completed June 2025) with Keytruda  (completed 12/29/2023) --------------------------------------------------------------------------------------- 03/09/2024: PET/CT: 4 mm node right axilla SUV 3.7, right subpectoral activity SUV 3.4 (possible 5 mm node) right apical 7 mm nodule is new compared to 12/23/2023 with an SUV of 2.3, right upper lobe 1 cm nodule new with a SUV of 3.6, left upper lobe nodule 7 mm SUV 4.2, soft tissue fullness proximal duodenum SUV 8.7 (possible duodenitis)  EGD: Bx: Duodenitis Lung FNA: malignant cells consistent with breast primary (Prog panel and Caris pending) Brain MRI: Neg  Treatment plan: Would be based on the prog panel Since previously tumor was Her 2 1+, we will consider Enhertu

## 2024-03-26 ENCOUNTER — Inpatient Hospital Stay (HOSPITAL_BASED_OUTPATIENT_CLINIC_OR_DEPARTMENT_OTHER): Admitting: Hematology and Oncology

## 2024-03-26 ENCOUNTER — Encounter: Payer: Self-pay | Admitting: General Practice

## 2024-03-26 ENCOUNTER — Telehealth: Payer: Self-pay

## 2024-03-26 VITALS — BP 120/78 | HR 78 | Temp 98.3°F | Resp 8 | Ht 61.0 in | Wt 145.0 lb

## 2024-03-26 DIAGNOSIS — Z171 Estrogen receptor negative status [ER-]: Secondary | ICD-10-CM

## 2024-03-26 DIAGNOSIS — C50311 Malignant neoplasm of lower-inner quadrant of right female breast: Secondary | ICD-10-CM

## 2024-03-26 MED ORDER — LIDOCAINE-PRILOCAINE 2.5-2.5 % EX CREA: TOPICAL_CREAM | CUTANEOUS | 3 refills | Status: AC

## 2024-03-26 MED ORDER — DEXAMETHASONE 4 MG PO TABS: ORAL_TABLET | ORAL | 1 refills | Status: DC

## 2024-03-26 MED ORDER — ONDANSETRON HCL 8 MG PO TABS: 8.0000 mg | ORAL_TABLET | Freq: Three times a day (TID) | ORAL | 1 refills | Status: DC | PRN

## 2024-03-26 MED ORDER — PROCHLORPERAZINE MALEATE 10 MG PO TABS: 10.0000 mg | ORAL_TABLET | Freq: Four times a day (QID) | ORAL | 1 refills | Status: DC | PRN

## 2024-03-26 NOTE — Progress Notes (Signed)
 [facility] Spiritual Care Note  Followed up briefly with Ayse for care and encouragement after her appointment with Dr Odean per her request. She may phone to follow up, and we also plan to visit at a future treatment.   [address] [name], [place], [facility] Pager [phone] Voicemail [phone]

## 2024-03-26 NOTE — Progress Notes (Signed)
 Patient Care Team: [name], MD as PCP - General (Hematology and Oncology) Dann Candyce RAMAN, MD as PCP - Cardiology (Cardiology) Fernande Elspeth BROCKS, MD as PCP - Electrophysiology (Cardiology) [name], MD as Consulting Physician (Hematology and Oncology) Izell Domino, MD as Attending Physician (Radiation Oncology) Aron Shoulders, MD as Consulting Physician (General Surgery) Tyree Nanetta SAILOR, RN as Oncology Nurse Navigator Glean, Stephane BROCKS, RN (Inactive) as Oncology Nurse Navigator  DIAGNOSIS:  Encounter Diagnosis  Name Primary?   Malignant neoplasm of lower-inner quadrant of right breast of female, estrogen receptor negative (HCC) Yes    SUMMARY OF ONCOLOGIC HISTORY: Oncology History  Malignant neoplasm of lower-inner quadrant of right breast of female, estrogen receptor negative (HCC)  01/10/2023 Initial Diagnosis   Palpable lump right axilla x 2 weeks bulky axillary lymphadenopathy ultrasound breast: 2 masses at 4 o'clock position 1.1 cm and 0.6 cm multiple axillary lymph nodes largest 4.5 cm also level 2 and 3 (subpectoral lymph node as well): Biopsy: Grade 3 IDC ER 0%, PR 0%, Ki-67 95%, HER2 1+   01/26/2023 Genetic Testing   Negative [employer] Custom Panel (37 genes).  Report date is 01/26/2023.    The [employer] Custom Cancers + RNA Panel includes sequencing, deletion/duplication, and RNA analysis of the following 37 genes: APC, ATM, AXIN2, BARD1, BMPR1A, BRCA1, BRCA2, BRIP1, CDH1, CDK4*, CDKN2A (p14ARF)*, CDKN2A (p16INK4a), CHEK2, CTNNA1, DICER1, EPCAM*, FH, GREM1*, HOXB13*, MBD4*, MLH1, MSH2, MSH3, MSH6, MUTYH, NF1, NTHL1, PALB2, PMS2, POLD1, POLE, PTEN, RAD51C, RAD51D, SMAD4, SMARCA4, STK11, TP53.  *Genes without RNA analysis.    01/28/2023 - 06/27/2023 Chemotherapy   Patient is on Treatment Plan : BREAST Pembrolizumab  (200) D1 + Carboplatin  (1.5) D1,8,15 + Paclitaxel  (80) D1,8,15 q21d X 4 cycles / Pembrolizumab  (200) D1 + AC D1 q21d x 4 cycles     07/15/2023 -  Chemotherapy    Patient is on Treatment Plan : BREAST Pembrolizumab  (200) q21d x 27 weeks     09/07/2023 - 10/26/2023 Radiation Therapy   First Treatment Date: 2023-09-07 Last Treatment Date: 2023-10-26   Plan Name: CW_R_BO Site: Chest Wall, Right Technique: 3D Mode: Photon Dose Per Fraction: 1.8 Gy Prescribed Dose (Delivered / Prescribed): 25.2 Gy / 25.2 Gy Prescribed Fxs (Delivered / Prescribed): 14 / 14   Plan Name: CW_R_Neck_Ax Site: Chest Wall, Right Technique: 3D Mode: Photon Dose Per Fraction: 1.8 Gy Prescribed Dose (Delivered / Prescribed): 50.4 Gy / 50.4 Gy Prescribed Fxs (Delivered / Prescribed): 28 / 28   Plan Name: CW_R_Bst Site: Chest Wall, Right Technique: 3D Mode: Photon Dose Per Fraction: 1.8 Gy Prescribed Dose (Delivered / Prescribed): 9 Gy / 9 Gy Prescribed Fxs (Delivered / Prescribed): 5 / 5   Plan Name: CW_R Site: Chest Wall, Right Technique: 3D Mode: Photon Dose Per Fraction: 1.8 Gy Prescribed Dose (Delivered / Prescribed): 25.2 Gy / 25.2 Gy Prescribed Fxs (Delivered / Prescribed): 14 / 14       CHIEF COMPLIANT: F/U to discuss treatment plan  HISTORY OF PRESENT ILLNESS:   History of Present Illness Suzanne Tucker is a 43 year old female with pulmonary nodules who presents for follow-up on recent imaging findings.  Recent CT imaging shows seven new pulmonary nodules in the mid to upper lungs, predominantly on the right side, with growth since the last PET scan in June. The largest nodule in the right upper lobe has increased from 1.8 cm to 2 cm.  A port was set up approximately two weeks ago, and she is eager to begin treatment.  She has a mild, non-productive cough occurring twice a day, which is improving. CT scan indicates mild patchy ground glass opacity in the left lower lobe, suggestive of mild infectious or inflammatory changes.  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 She is awaiting further pathology results to confirm her current HER2 status.     ALLERGIES:  is allergic to antihistamines, loratadine-type; kiwi extract; morphine; oxycontin  [oxycodone ]; tape; tessalon [benzonatate]; tramadol ; codeine; hydrocodone; and latex.  MEDICATIONS:  Current Outpatient Medications  Medication Sig Dispense Refill   acetaminophen  (TYLENOL ) 500 MG tablet Take 1,000 mg by mouth every 6 (six) hours as needed for fever or mild pain (pain score 1-3).     ALPRAZolam  (XANAX ) 0.5 MG tablet Take 1 tablet (0.5 mg total) by mouth 3 (three) times daily as needed for anxiety. 30 tablet 1   calcium  carbonate (TUMS - DOSED IN MG ELEMENTAL CALCIUM ) 500 MG chewable tablet Chew 1,000 mg by mouth as needed for indigestion or heartburn.     Dextrose -Fructose-Sod Citrate (NAUZENE) [phone] MG CHEW Chew 1 tablet by mouth daily as needed (Nausea).     diclofenac  Sodium (VOLTAREN ) 1 % GEL Research Patient: Apply 0.5 grams (1 fingertip) to each hand and each foot twice daily for up to 12 weeks 400 g 0   fluticasone  (FLONASE ) 50 MCG/ACT nasal spray Place 2 sprays into both nostrils daily as needed for allergies.     lidocaine -prilocaine  (EMLA ) cream Apply to affected area once 30 g 3   ondansetron  (ZOFRAN ) 8 MG tablet Take 1 tablet (8 mg total) by mouth every 8 (eight) hours as needed for nausea or vomiting. 30 tablet 2   pantoprazole  (PROTONIX ) 40 MG tablet Take 1 tablet (40 mg total) by mouth 2 (two) times daily. Protonix  40mg  PO BID for 8 weeks then reduce to 40 mg daily 180 tablet 3   No current facility-administered medications for this visit.    PHYSICAL EXAMINATION: ECOG PERFORMANCE STATUS: 1 - Symptomatic but completely ambulatory  Vitals:   03/26/24 1100  BP: 120/78  Pulse: 78  Resp: (!) 8  Temp: 98.3 F (36.8 C)  SpO2: 100%   Filed Weights   03/26/24 1100  Weight: 145 lb (65.8 kg)    Physical Exam   (exam performed in the presence of a chaperone)  LABORATORY  DATA:  I have reviewed the data as listed    Latest Ref Rng & Units 03/12/2024   12:45 PM 12/29/2023    1:05 PM 12/08/2023   10:47 AM  CMP  Glucose 70 - 99 mg/dL 93  891  93   BUN 6 - 20 mg/dL 13  11  9    Creatinine 0.44 - 1.00 mg/dL 9.45  9.41  9.39   Sodium 135 - 145 mmol/L 137  139  137   Potassium 3.5 - 5.1 mmol/L 3.8  3.5  3.9   Chloride 98 - 111 mmol/L 105  105  105   CO2 22 - 32 mmol/L 24  29  28    Calcium  8.9 - 10.3 mg/dL 9.3  9.0  9.0   Total Protein 6.5 - 8.1 g/dL 7.4  6.9  6.9   Total Bilirubin 0.0 - 1.2 mg/dL 1.0  0.6  0.6   Alkaline Phos 38 - 126 U/L 100  75  67   AST 15 - 41 U/L 18  14  15    ALT 0 - 44 U/L 7  8  7  Lab Results  Component Value Date   WBC 4.9 03/12/2024   HGB 11.7 (L) 03/12/2024   HCT 33.4 (L) 03/12/2024   MCV 98.8 03/12/2024   PLT 206 03/12/2024   NEUTROABS 3.8 03/12/2024    ASSESSMENT & PLAN:  Malignant neoplasm of lower-inner quadrant of right breast of female, estrogen receptor negative (HCC) 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%, HER2 1+  01/25/2023: PET/CT: Right breast cancer, right axilla, retropectoral and right cervical lymph nodes   Treatment plan: Neoadjuvant chemotherapy with Taxol  carbo Keytruda  weekly x 12 followed by Adriamycin  Cytoxan  Keytruda  followed by Keytruda  maintenance until 12/30/2023 07/27/2023: Bilateral mastectomies: Left mastectomy: Benign, right mastectomy: No evidence of residual cancer, 8/20 lymph nodes positive with extranodal extension in 2 lymph nodes Adjuvant radiation therapy 09/08/2023-10/24/2022 Capecitabine  (completed June 2025) with Keytruda  (completed 12/29/2023) --------------------------------------------------------------------------------------- 03/09/2024:  PET/CT: 4 mm node right axilla SUV 3.7, right subpectoral activity SUV 3.4 (possible 5 mm node) right apical 7 mm nodule is new compared to 12/23/2023 with an SUV of 2.3, right upper lobe 1 cm nodule new with a SUV of 3.6, left upper lobe nodule 7 mm SUV 4.2, soft tissue fullness proximal duodenum SUV 8.7 (possible duodenitis)  EGD: Bx: Duodenitis Lung FNA: malignant cells consistent with breast primary (Prog panel and Caris pending) Brain MRI: Neg  Treatment plan: Would be based on the prog panel Since previously tumor was Her 2 1+, we will consider Enhertu  ------------------------------------- Assessment and Plan Assessment & Plan Malignant neoplasm of right breast HER2 low population identified. Enhertu proposed, combining Herceptin with chemotherapy targeting HER2 positive cells. Major side effects include nausea, blood count changes, potential lung inflammation, and fatigue. Echocardiogram needed for cardiac monitoring. Kairis molecular test ordered for additional mutations, including PD-L1. - Start Enhertu treatment every three weeks. - Monitor CBC and CMP with each treatment. - Order echocardiogram every three months to monitor cardiac ejection fraction. - Await Kairis molecular test results for additional treatment options.  Pulmonary nodules CT scan shows seven pulmonary nodules, right greater than left, with some increase in size. Enhertu treatment planned as part of overall cancer strategy. Growth rate monitored using largest dimension. - Proceed with Enhertu treatment as planned for malignant neoplasm of right breast.  Mild infectious inflammatory lung condition CT scan indicates mild patchy ground glass opacity in left lower lobe, suggestive of mild infectious or inflammatory process. No significant symptoms, only occasional cough. Possible aspiration during procedure considered. Interstitial lung disease monitored due to Enhertu risk. - Monitor for symptoms such as increasing  shortness of breath or cough. - Consider antibiotics if symptoms worsen.      No orders of the defined types were placed in this encounter.  The patient has a good understanding of the overall plan. she agrees with it. she will call with any problems that may develop before the next visit here. Total time spent: 45 mins including face to face time and time spent for planning, charting and co-ordination of care   Naomi MARLA Chad, MD 03/26/24

## 2024-03-26 NOTE — Progress Notes (Signed)
 DISCONTINUE ON PATHWAY REGIMEN - Breast     Cycles 1 through 4: A cycle is every 21 days:     Pembrolizumab       Paclitaxel       Carboplatin       Filgrastim -xxxx    Cycles 5 through 8: A cycle is every 21 days:     Pembrolizumab       Doxorubicin       Cyclophosphamide       Pegfilgrastim -xxxx   **Always confirm dose/schedule in your pharmacy ordering system**  PRIOR TREATMENT: [id number]: Pembrolizumab  200 mg D1 + Paclitaxel  80 mg/m2 D1, 8, 15 + Carboplatin  AUC=5 D1 q21 Days x 12 Weeks, Followed by Pembrolizumab  200 mg + Doxorubicin  + Cyclophosphamide  q21 Days x 12 Weeks, Followed by Surgery  START ON PATHWAY REGIMEN - Breast     A cycle is every 21 days:     Fam-trastuzumab deruxtecan-nxki   **Always confirm dose/schedule in your pharmacy ordering system**  Patient Characteristics: Distant Metastases or Locoregional Recurrent Disease - Unresected, M0 or Locally Advanced Unresectable Disease Progressing after Neoadjuvant and Local Therapies, M0, HER2 Negative/Ultralow/Low, ER Negative, Chemotherapy, HER2 Low, Second Line, PD-L1  Expression Negative/Unknown or Not a Candidate for Immunotherapy, and gBRCA Wildtype Therapeutic Status: Distant Metastases HER2 Status: Low ER Status: Negative (-) PR Status: Negative (-) Therapy Approach Indicated: Standard Chemotherapy/Endocrine Therapy Line of Therapy: Second Line Intent of Therapy: Curative Intent, Discussed with Patient

## 2024-03-26 NOTE — Telephone Encounter (Signed)
 Email sent to [facility], Tonya Pendell, Cydney Frazier, Kelsey DeSota, RN and Dr Odean regarding path [phone]; requesting prognostic panel and Caris to be completed.

## 2024-03-26 NOTE — Progress Notes (Signed)

## 2024-03-27 ENCOUNTER — Encounter (HOSPITAL_BASED_OUTPATIENT_CLINIC_OR_DEPARTMENT_OTHER): Payer: Self-pay | Admitting: General Surgery

## 2024-03-27 ENCOUNTER — Encounter (HOSPITAL_BASED_OUTPATIENT_CLINIC_OR_DEPARTMENT_OTHER)
Admission: RE | Disposition: A | Discharge status: Home / Self Care | Payer: Self-pay | Source: Home / Self Care | Attending: General Surgery

## 2024-03-27 ENCOUNTER — Ambulatory Visit (HOSPITAL_COMMUNITY)

## 2024-03-27 ENCOUNTER — Ambulatory Visit (HOSPITAL_BASED_OUTPATIENT_CLINIC_OR_DEPARTMENT_OTHER): Payer: Self-pay | Admitting: Anesthesiology

## 2024-03-27 ENCOUNTER — Ambulatory Visit: Admitting: Acute Care

## 2024-03-27 ENCOUNTER — Encounter: Payer: Self-pay | Admitting: *Deleted

## 2024-03-27 ENCOUNTER — Ambulatory Visit (HOSPITAL_BASED_OUTPATIENT_CLINIC_OR_DEPARTMENT_OTHER)
Admission: RE | Admit: 2024-03-27 | Discharge: 2024-03-27 | Disposition: A | Discharge status: Home / Self Care | Attending: General Surgery | Admitting: General Surgery

## 2024-03-27 ENCOUNTER — Other Ambulatory Visit: Payer: Self-pay

## 2024-03-27 DIAGNOSIS — K219 Gastro-esophageal reflux disease without esophagitis: Secondary | ICD-10-CM | POA: Insufficient documentation

## 2024-03-27 DIAGNOSIS — C50311 Malignant neoplasm of lower-inner quadrant of right female breast: Secondary | ICD-10-CM | POA: Diagnosis present

## 2024-03-27 DIAGNOSIS — Z79899 Other long term (current) drug therapy: Secondary | ICD-10-CM | POA: Insufficient documentation

## 2024-03-27 DIAGNOSIS — C50919 Malignant neoplasm of unspecified site of unspecified female breast: Secondary | ICD-10-CM | POA: Insufficient documentation

## 2024-03-27 DIAGNOSIS — G4730 Sleep apnea, unspecified: Secondary | ICD-10-CM | POA: Diagnosis not present

## 2024-03-27 HISTORY — PX: PORTACATH PLACEMENT: SHX2246

## 2024-03-27 SURGERY — INSERTION, TUNNELED CENTRAL VENOUS DEVICE, WITH PORT
Anesthesia: General | Laterality: Left

## 2024-03-27 MED ORDER — LIDOCAINE-EPINEPHRINE (PF) 1 %-1:200000 IJ SOLN
INTRAMUSCULAR | Status: AC
Start: 2024-03-27 — End: 2024-03-27
  Filled 2024-03-27: qty 30

## 2024-03-27 MED ORDER — FENTANYL CITRATE (PF) 100 MCG/2ML IJ SOLN: 25.0000 ug | INTRAMUSCULAR | Status: DC | PRN

## 2024-03-27 MED ORDER — MIDAZOLAM HCL 2 MG/2ML IJ SOLN
INTRAMUSCULAR | Status: AC
  Filled 2024-03-27: qty 2

## 2024-03-27 MED ORDER — FENTANYL CITRATE (PF) 100 MCG/2ML IJ SOLN
INTRAMUSCULAR | Status: DC | PRN
  Administered 2024-03-27 (×2): 50 ug via INTRAVENOUS

## 2024-03-27 MED ORDER — ACETAMINOPHEN 500 MG PO TABS
ORAL_TABLET | ORAL | Status: AC
  Filled 2024-03-27: qty 2

## 2024-03-27 MED ORDER — MIDAZOLAM HCL 5 MG/5ML IJ SOLN
INTRAMUSCULAR | Status: DC | PRN
  Administered 2024-03-27: 2 mg via INTRAVENOUS

## 2024-03-27 MED ORDER — PHENYLEPHRINE HCL (PRESSORS) 10 MG/ML IV SOLN
INTRAVENOUS | Status: DC | PRN
Start: 2024-03-27 — End: 2024-03-27
  Administered 2024-03-27 (×2): 80 ug via INTRAVENOUS

## 2024-03-27 MED ORDER — CEFAZOLIN SODIUM-DEXTROSE 2-4 GM/100ML-% IV SOLN
INTRAVENOUS | Status: AC
Start: 2024-03-27 — End: 2024-03-27
  Filled 2024-03-27: qty 100

## 2024-03-27 MED ORDER — PROPOFOL 500 MG/50ML IV EMUL
INTRAVENOUS | Status: AC
  Filled 2024-03-27: qty 50

## 2024-03-27 MED ORDER — PROPOFOL 10 MG/ML IV BOLUS
INTRAVENOUS | Status: DC | PRN
  Administered 2024-03-27: 120 mg via INTRAVENOUS

## 2024-03-27 MED ORDER — DEXAMETHASONE SODIUM PHOSPHATE 4 MG/ML IJ SOLN
INTRAMUSCULAR | Status: DC | PRN
  Administered 2024-03-27: 5 mg via INTRAVENOUS

## 2024-03-27 MED ORDER — FENTANYL CITRATE (PF) 100 MCG/2ML IJ SOLN
INTRAMUSCULAR | Status: AC
  Filled 2024-03-27: qty 2

## 2024-03-27 MED ORDER — DEXAMETHASONE SODIUM PHOSPHATE 10 MG/ML IJ SOLN
INTRAMUSCULAR | Status: AC
  Filled 2024-03-27: qty 1

## 2024-03-27 MED ORDER — HEPARIN SOD (PORK) LOCK FLUSH 100 UNIT/ML IV SOLN
INTRAVENOUS | Status: DC | PRN
  Administered 2024-03-27: 500 [IU] via INTRAVENOUS

## 2024-03-27 MED ORDER — ONDANSETRON HCL 4 MG/2ML IJ SOLN
INTRAMUSCULAR | Status: DC | PRN
  Administered 2024-03-27: 4 mg via INTRAVENOUS

## 2024-03-27 MED ORDER — ONDANSETRON HCL 4 MG/2ML IJ SOLN
INTRAMUSCULAR | Status: AC
  Filled 2024-03-27: qty 2

## 2024-03-27 MED ORDER — LIDOCAINE 2% (20 MG/ML) 5 ML SYRINGE
INTRAMUSCULAR | Status: DC | PRN
  Administered 2024-03-27: 60 mg via INTRAVENOUS

## 2024-03-27 MED ORDER — EPHEDRINE SULFATE (PRESSORS) 50 MG/ML IJ SOLN
INTRAMUSCULAR | Status: DC | PRN
  Administered 2024-03-27: 10 mg via INTRAVENOUS
  Administered 2024-03-27 (×2): 5 mg via INTRAVENOUS

## 2024-03-27 MED ORDER — BUPIVACAINE HCL (PF) 0.25 % IJ SOLN
INTRAMUSCULAR | Status: AC
Start: 2024-03-27 — End: 2024-03-27
  Filled 2024-03-27: qty 30

## 2024-03-27 MED ORDER — BUPIVACAINE-EPINEPHRINE (PF) 0.5% -1:200000 IJ SOLN
INTRAMUSCULAR | Status: AC
  Filled 2024-03-27: qty 30

## 2024-03-27 MED ORDER — LACTATED RINGERS IV SOLN: INTRAVENOUS | Status: DC

## 2024-03-27 MED ORDER — HEPARIN (PORCINE) IN NACL 2-0.9 UNITS/ML
INTRAMUSCULAR | Status: AC | PRN
  Administered 2024-03-27: 500 mL

## 2024-03-27 MED ORDER — EPHEDRINE 5 MG/ML INJ
INTRAVENOUS | Status: AC
  Filled 2024-03-27: qty 5

## 2024-03-27 MED ORDER — LIDOCAINE 2% (20 MG/ML) 5 ML SYRINGE
INTRAMUSCULAR | Status: AC
Start: 2024-03-27 — End: 2024-03-27
  Filled 2024-03-27: qty 5

## 2024-03-27 MED ORDER — CHLORHEXIDINE GLUCONATE CLOTH 2 % EX PADS: 6.0000 | MEDICATED_PAD | Freq: Once | CUTANEOUS | Status: DC

## 2024-03-27 MED ORDER — ACETAMINOPHEN 500 MG PO TABS
1000.0000 mg | ORAL_TABLET | ORAL | Status: AC
  Administered 2024-03-27: 1000 mg via ORAL

## 2024-03-27 MED ORDER — FENTANYL CITRATE (PF) 100 MCG/2ML IJ SOLN
INTRAMUSCULAR | Status: AC
Start: 2024-03-27 — End: 2024-03-27
  Filled 2024-03-27: qty 2

## 2024-03-27 MED ORDER — CEFAZOLIN SODIUM-DEXTROSE 2-4 GM/100ML-% IV SOLN
2.0000 g | INTRAVENOUS | Status: AC
  Administered 2024-03-27: 2 g via INTRAVENOUS

## 2024-03-27 MED ORDER — LIDOCAINE-EPINEPHRINE (PF) 1 %-1:200000 IJ SOLN
INTRAMUSCULAR | Status: DC | PRN
  Administered 2024-03-27: 10 mL

## 2024-03-27 MED ORDER — AMISULPRIDE (ANTIEMETIC) 5 MG/2ML IV SOLN: 10.0000 mg | Freq: Once | INTRAVENOUS | Status: DC | PRN

## 2024-03-27 MED ORDER — PROPOFOL 10 MG/ML IV BOLUS
INTRAVENOUS | Status: AC
  Filled 2024-03-27: qty 20

## 2024-03-27 SURGICAL SUPPLY — 36 items
BAG DECANTER FOR FLEXI CONT (MISCELLANEOUS) ×1 IMPLANT
BLADE HEX COATED 2.75 (ELECTRODE) ×1 IMPLANT
BLADE SURG 11 STRL SS (BLADE) ×1 IMPLANT
BLADE SURG 15 STRL LF DISP TIS (BLADE) ×1 IMPLANT
CHLORAPREP W/TINT 26 (MISCELLANEOUS) ×1 IMPLANT
COVER BACK TABLE 60X90IN (DRAPES) ×1 IMPLANT
COVER MAYO STAND STRL (DRAPES) ×1 IMPLANT
DERMABOND ADVANCED .7 DNX12 (GAUZE/BANDAGES/DRESSINGS) ×1 IMPLANT
DRAPE C-ARM 42X72 X-RAY (DRAPES) ×1 IMPLANT
DRAPE LAPAROTOMY TRNSV 102X78 (DRAPES) ×1 IMPLANT
DRAPE UTILITY XL STRL (DRAPES) ×1 IMPLANT
DRSG TEGADERM 4X4.75 (GAUZE/BANDAGES/DRESSINGS) IMPLANT
ELECTRODE REM PT RTRN 9FT ADLT (ELECTROSURGICAL) ×1 IMPLANT
GAUZE 4X4 16PLY ~~LOC~~+RFID DBL (SPONGE) ×1 IMPLANT
GAUZE SPONGE 4X4 12PLY STRL LF (GAUZE/BANDAGES/DRESSINGS) IMPLANT
GLOVE BIO SURGEON STRL SZ 6 (GLOVE) ×1 IMPLANT
GLOVE BIOGEL PI IND STRL 6.5 (GLOVE) ×1 IMPLANT
GOWN STRL REUS W/ TWL LRG LVL3 (GOWN DISPOSABLE) ×1 IMPLANT
GOWN STRL REUS W/ TWL XL LVL3 (GOWN DISPOSABLE) ×1 IMPLANT
IV CONNECTOR ONE LINK NDLESS (IV SETS) IMPLANT
KIT CVR 48X5XPRB PLUP LF (MISCELLANEOUS) ×1 IMPLANT
KIT PORT POWER 8FR ISP CVUE (Port) IMPLANT
NDL HYPO 25X1 1.5 SAFETY (NEEDLE) ×1 IMPLANT
NEEDLE HYPO 25X1 1.5 SAFETY (NEEDLE) ×1 IMPLANT
PACK BASIN DAY SURGERY FS (CUSTOM PROCEDURE TRAY) ×1 IMPLANT
PENCIL SMOKE EVACUATOR (MISCELLANEOUS) ×1 IMPLANT
SLEEVE SCD COMPRESS KNEE MED (STOCKING) ×1 IMPLANT
SPIKE FLUID TRANSFER (MISCELLANEOUS) IMPLANT
SUT MNCRL AB 4-0 PS2 18 (SUTURE) ×1 IMPLANT
SUT PROLENE 2 0 SH DA (SUTURE) ×2 IMPLANT
SUT VIC AB 3-0 SH 27X BRD (SUTURE) ×1 IMPLANT
SUT VICRYL 3-0 CR8 SH (SUTURE) IMPLANT
SYR 10ML LL (SYRINGE) ×1 IMPLANT
SYR 5ML LUER SLIP (SYRINGE) ×1 IMPLANT
SYR CONTROL 10ML LL (SYRINGE) ×1 IMPLANT
TOWEL GREEN STERILE FF (TOWEL DISPOSABLE) ×1 IMPLANT

## 2024-03-27 NOTE — Discharge Instructions (Addendum)
 [facility] Office Phone Number [phone]   POST OP INSTRUCTIONS  Always review your discharge instruction sheet given to you by the facility where your surgery was performed.  IF YOU HAVE DISABILITY OR FAMILY LEAVE FORMS, YOU MUST BRING THEM TO THE OFFICE FOR PROCESSING.  DO NOT GIVE THEM TO YOUR DOCTOR.  Take 2 tylenol  (acetominophen) three times a day for 3 days.  If you still have pain, add ibuprofen  with food in between if able to take this (if you have kidney issues or stomach issues, do not take ibuprofen ).  If both of those are not enough, add the narcotic pain pill.  If you find you are needing a lot of this overnight after surgery, call the next morning for a refill.   Take your usually prescribed medications unless otherwise directed If you need a refill on your pain medication, please contact your pharmacy.  They will contact our office to request authorization.  Prescriptions will not be filled after 5pm or on week-ends. You should eat very light the first 24 hours after surgery, such as soup, crackers, pudding, etc.  Resume your normal diet the day after surgery It is common to experience some constipation if taking pain medication after surgery.  Increasing fluid intake and taking a stool softener will usually help or prevent this problem from occurring.  A mild laxative (Milk of Magnesia or Miralax ) should be taken according to package directions if there are no bowel movements after 48 hours. You may shower in 48 hours.  The surgical glue will flake off in 2-3 weeks.   ACTIVITIES:  No strenuous activity or heavy lifting for 1 week.   You may drive when you no longer are taking prescription pain medication, you can comfortably wear a seatbelt, and you can safely maneuver your car and apply brakes. RETURN TO WORK:  __________as tolerated if no lifting for 1 week.__ You should see your doctor in the office for a follow-up appointment approximately three-four weeks  after your surgery.    WHEN TO CALL YOUR DOCTOR: Fever over 101.0 Nausea and/or vomiting. Extreme swelling or bruising. Continued bleeding from incision. Increased pain, redness, or drainage from the incision.  The clinic staff is available to answer your questions during regular business hours.  Please don't hesitate to call and ask to speak to one of the nurses for clinical concerns.  If you have a medical emergency, go to the nearest emergency room or call 911.  A surgeon from [facility] is always on call at the hospital.  For further questions, please visit [email or web address]   No Tylenol  until 4:39pm, if needed

## 2024-03-27 NOTE — Anesthesia Preprocedure Evaluation (Addendum)
 Anesthesia Evaluation  Patient identified by MRN, date of birth, ID band Patient awake    Reviewed: Allergy & Precautions, NPO status , Patient's Chart, lab work & pertinent test results  History of Anesthesia Complications (+) PONV and history of anesthetic complications  Airway Mallampati: II  TM Distance: >3 FB Neck ROM: Full   Comment: Previous grade II view with Miller 2, easy mask with OPA Dental  (+) Dental Advisory Given   Pulmonary neg shortness of breath, sleep apnea (does not use CPAP) , neg COPD, neg recent URI Nodules    Pulmonary exam normal breath sounds clear to auscultation       Cardiovascular (-) hypertension(-) angina (-) Past MI, (-) Cardiac Stents and (-) CABG (-) dysrhythmias + Valvular Problems/Murmurs MVP  Rhythm:Regular Rate:Normal  TTE 04/19/2023: IMPRESSIONS    1. Left ventricular ejection fraction, by estimation, is 60 to 65%. Left  ventricular ejection fraction by 3D volume is 61 %. The left ventricle has  normal function. The left ventricle has no regional wall motion  abnormalities. Left ventricular diastolic   parameters were normal. The average left ventricular global longitudinal  strain is -18.9 %. The global longitudinal strain is normal.   2. Right ventricular systolic function is normal. The right ventricular  size is normal. Tricuspid regurgitation signal is inadequate for assessing  PA pressure.   3. The mitral valve is normal in structure. Trivial mitral valve  regurgitation. No evidence of mitral stenosis.   4. The aortic valve is normal in structure. Aortic valve regurgitation is  not visualized. No aortic stenosis is present.   5. The inferior vena cava is normal in size with greater than 50%  respiratory variability, suggesting right atrial pressure of 3 mmHg.     Neuro/Psych  PSYCHIATRIC DISORDERS  Depression    negative neurological ROS     GI/Hepatic Neg liver ROS,GERD   Medicated,,  Endo/Other  negative endocrine ROS    Renal/GU negative Renal ROS     Musculoskeletal   Abdominal   Peds  Hematology  (+) Blood dyscrasia, anemia Lab Results      Component                Value               Date                      WBC                      4.9                 03/12/2024                HGB                      11.7 (L)            03/12/2024                HCT                      33.4 (L)            03/12/2024                MCV                      98.8  03/12/2024                PLT                      206                 03/12/2024              Anesthesia Other Findings   Reproductive/Obstetrics Breast cancer                             Anesthesia Physical Anesthesia Plan  ASA: 3  Anesthesia Plan: General   Post-op Pain Management: Tylenol  PO (pre-op)*   Induction: Intravenous  PONV Risk Score and Plan: 4 or greater and Ondansetron , Dexamethasone , Midazolam  and Treatment may vary due to age or medical condition  Airway Management Planned: LMA  Additional Equipment:   Intra-op Plan:   Post-operative Plan: Extubation in OR  Informed Consent: I have reviewed the patients History and Physical, chart, labs and discussed the procedure including the risks, benefits and alternatives for the proposed anesthesia with the patient or authorized representative who has indicated his/her understanding and acceptance.     Dental advisory given  Plan Discussed with: CRNA and Anesthesiologist  Anesthesia Plan Comments: (Risks of general anesthesia discussed including, but not limited to, sore throat, hoarse voice, chipped/damaged teeth, injury to vocal cords, nausea and vomiting, allergic reactions, lung infection, heart attack, stroke, and death. All questions answered. )        Anesthesia Quick Evaluation

## 2024-03-27 NOTE — Progress Notes (Signed)
 Per MD request, RN successfully faxed Caris request on recent tissue.

## 2024-03-27 NOTE — Anesthesia Procedure Notes (Signed)
 Procedure Name: LMA Insertion Date/Time: 03/27/2024 2:44 PM  Performed by: Emilio Rock BIRCH, CRNAPre-anesthesia Checklist: Patient identified, Emergency Drugs available, Suction available and Patient being monitored Patient Re-evaluated:Patient Re-evaluated prior to induction Oxygen Delivery Method: Circle System Utilized Preoxygenation: Pre-oxygenation with 100% oxygen Induction Type: IV induction Ventilation: Mask ventilation without difficulty LMA: LMA inserted LMA Size: 4.0 Number of attempts: 1 Airway Equipment and Method: bite block Placement Confirmation: positive ETCO2 Tube secured with: Tape Dental Injury: Teeth and Oropharynx as per pre-operative assessment

## 2024-03-27 NOTE — Anesthesia Postprocedure Evaluation (Signed)
 Anesthesia Post Note  Patient: Suzanne Tucker  Procedure(s) Performed: INSERTION, TUNNELED CENTRAL VENOUS DEVICE, WITH PORT (Left)     Patient location during evaluation: PACU Anesthesia Type: General Level of consciousness: awake and alert Pain management: pain level controlled Vital Signs Assessment: post-procedure vital signs reviewed and stable Respiratory status: spontaneous breathing, nonlabored ventilation, respiratory function stable and patient connected to nasal cannula oxygen Cardiovascular status: blood pressure returned to baseline and stable Postop Assessment: no apparent nausea or vomiting Anesthetic complications: no   No notable events documented.  Last Vitals:  Vitals:   03/27/24 1545 03/27/24 1600  BP: (!) 124/58 126/61  Pulse: 96 77  Resp: (!) 22 17  Temp: (!) 36.2 C   SpO2: 100% 99%    Last Pain:  Vitals:   03/27/24 1545  TempSrc:   PainSc: 0-No pain                 Thom JONELLE Peoples

## 2024-03-27 NOTE — H&P (Signed)
 Suzanne Tucker is an 43 y.o. female.   Chief Complaint: recurrent breast cancer HPI:  Patient with recurrent breast cancer, desires port removal.   Past Medical History:  Diagnosis Date   Anginal pain (HCC) 02/2019   related to MVP   Back pain    Breast cancer (HCC)    Depression    Dysrhythmia    MVP   Mitral prolapse    PONV (postoperative nausea and vomiting)    Sleep apnea    mild no cpap indicated    Past Surgical History:  Procedure Laterality Date   BREAST SURGERY Right 2024   Breast biopsy x 2   BRONCHIAL BIOPSY  03/19/2024   Procedure: BRONCHOSCOPY, WITH BIOPSY;  Surgeon: Shelah Lamar RAMAN, MD;  Location: [facility];  Service: Pulmonary;;   BRONCHIAL BRUSHINGS  03/19/2024   Procedure: BRONCHOSCOPY, WITH BRUSH BIOPSY;  Surgeon: Shelah Lamar RAMAN, MD;  Location: [facility];  Service: Pulmonary;;   BRONCHIAL NEEDLE ASPIRATION BIOPSY  03/19/2024   Procedure: BRONCHOSCOPY, WITH NEEDLE ASPIRATION BIOPSY;  Surgeon: Shelah Lamar RAMAN, MD;  Location: [facility];  Service: Pulmonary;;   BRONCHOSCOPY, WITH BIOPSY USING ELECTROMAGNETIC NAVIGATION Bilateral 03/19/2024   Procedure: BRONCHOSCOPY, WITH BIOPSY USING ELECTROMAGNETIC NAVIGATION;  Surgeon: Shelah Lamar RAMAN, MD;  Location: [facility];  Service: Pulmonary;  Laterality: Bilateral;  WITH FLURO   CESAREAN SECTION  03/30/2008   MASTECTOMY WITH AXILLARY LYMPH NODE DISSECTION Bilateral 07/27/2023   Procedure: BILATERAL MASTECTOMY WITH RIGHT AXILLARY LYMPH NODE DISSECTION;  Surgeon: Aron Shoulders, MD;  Location: [facility];  Service: General;  Laterality: Bilateral;  PEC BLOCK   PORT-A-CATH REMOVAL Left 01/31/2024   Procedure: REMOVAL PORT-A-CATH;  Surgeon: Aron Shoulders, MD;  Location: [facility];  Service: General;  Laterality: Left;   PORTACATH PLACEMENT N/A 01/27/2023   Procedure: INSERTION PORT-A-CATH;  Surgeon: Aron Shoulders, MD;  Location: [facility];  Service: General;  Laterality: N/A;   leaving accessed   ROBOTIC ASSISTED TOTAL HYSTERECTOMY Bilateral 03/15/2019   Procedure: XI ROBOTIC ASSISTED TOTAL HYSTERECTOMY with Bilateral Salpingectomy;  Surgeon: Darcel Pool, MD;  Location: [facility];  Service: Gynecology;  Laterality: Bilateral;   TUBAL LIGATION  2017    Family History  Problem Relation Age of Onset   Stroke Mother    Alzheimer's disease Father    Diabetes Maternal Grandmother    Heart disease Maternal Grandmother    Heart attack Maternal Grandmother    Diabetes Maternal Grandfather    Heart disease Maternal Grandfather    Alzheimer's disease Paternal Grandmother    Dementia Paternal Grandmother    Alzheimer's disease Paternal Grandfather    Dementia Paternal Grandfather    Alcohol abuse Paternal Grandfather    Social History:  reports that she has never smoked. She has never used smokeless tobacco. She reports that she does not currently use alcohol. She reports that she does not use drugs.  Allergies:  Allergies  Allergen Reactions   Antihistamines, Loratadine-Type Other (See Comments)    Patient reports numbness of the tongue   Kiwi Extract Swelling and Other (See Comments)    Mouth tingles, too   Morphine Anaphylaxis and Shortness Of Breath   Oxycontin  [Oxycodone ] Other (See Comments)    RPh has recommended this not be taken  (Oxycontin )   Tape Other (See Comments)    NEEDS DRESSINGS FOR SENSITIVE SKIN!!!   Tessalon [Benzonatate] Hives, Itching and Other (See Comments)    Hives, itching, burning all over.    Tramadol   Swelling and Other (See Comments)    Tingling in the mouth with swelling    Codeine Nausea And Vomiting   Hydrocodone Hives   Latex Rash    Medications Prior to Admission  Medication Sig Dispense Refill   acetaminophen  (TYLENOL ) 500 MG tablet Take 1,000 mg by mouth every 6 (six) hours as needed for fever or mild pain (pain score 1-3).     calcium  carbonate (TUMS - DOSED IN MG ELEMENTAL CALCIUM ) 500 MG  chewable tablet Chew 1,000 mg by mouth as needed for indigestion or heartburn.     Dextrose -Fructose-Sod Citrate (NAUZENE) 968-175-230 MG CHEW Chew 1 tablet by mouth daily as needed (Nausea).     fluticasone  (FLONASE ) 50 MCG/ACT nasal spray Place 2 sprays into both nostrils daily as needed for allergies.     pantoprazole  (PROTONIX ) 40 MG tablet Take 1 tablet (40 mg total) by mouth 2 (two) times daily. Protonix  40mg  PO BID for 8 weeks then reduce to 40 mg daily 180 tablet 3   ALPRAZolam  (XANAX ) 0.5 MG tablet Take 1 tablet (0.5 mg total) by mouth 3 (three) times daily as needed for anxiety. 30 tablet 1   dexamethasone  (DECADRON ) 4 MG tablet Take 2 tablets (8 mg) by mouth daily for 3 days starting the day after chemotherapy. Take with food. 30 tablet 1   diclofenac  Sodium (VOLTAREN ) 1 % GEL Research Patient: Apply 0.5 grams (1 fingertip) to each hand and each foot twice daily for up to 12 weeks 400 g 0   lidocaine -prilocaine  (EMLA ) cream Apply to affected area once 30 g 3   lidocaine -prilocaine  (EMLA ) cream Apply to affected area once 30 g 3   ondansetron  (ZOFRAN ) 8 MG tablet Take 1 tablet (8 mg total) by mouth every 8 (eight) hours as needed for nausea or vomiting. 30 tablet 2   ondansetron  (ZOFRAN ) 8 MG tablet Take 1 tablet (8 mg total) by mouth every 8 (eight) hours as needed for nausea or vomiting. Start on the third day after chemotherapy. 30 tablet 1   prochlorperazine  (COMPAZINE ) 10 MG tablet Take 1 tablet (10 mg total) by mouth every 6 (six) hours as needed for nausea or vomiting. 30 tablet 1    No results found for this or any previous visit (from the past 48 hours). No results found.  Review of Systems  All other systems reviewed and are negative.   Blood pressure 102/72, pulse 96, temperature (!) 97.3 F (36.3 C), temperature source Temporal, resp. rate 20, height 5' 1 (1.549 m), weight 64.7 kg, SpO2 100%. Physical Exam Vitals reviewed.  Constitutional:      Appearance: Normal  appearance.  HENT:     Head: Normocephalic.     Right Ear: External ear normal.     Left Ear: External ear normal.   Eyes:     General: No scleral icterus.    Extraocular Movements: Extraocular movements intact.     Pupils: Pupils are equal, round, and reactive to light.    Cardiovascular:     Rate and Rhythm: Normal rate.     Pulses: Normal pulses.  Pulmonary:     Effort: Pulmonary effort is normal.   Skin:    General: Skin is warm and dry.     Capillary Refill: Capillary refill takes 2 to 3 seconds.   Neurological:     General: No focal deficit present.     Mental Status: She is alert.     Motor: No weakness.     Coordination: Coordination  normal.   Psychiatric:        Mood and Affect: Mood normal.        Behavior: Behavior normal.        Thought Content: Thought content normal.        Judgment: Judgment normal.      Assessment/Plan Recurrent breast cancer. Plan replacement of port.   Reviewed procedure and risks.   Jina Nephew, MD 03/27/2024, 12:10 PM

## 2024-03-27 NOTE — Op Note (Signed)
 PREOPERATIVE DIAGNOSIS:  recurrent breast cancer     POSTOPERATIVE DIAGNOSIS:  Same     PROCEDURE: Left subclavian port placement, Bard ClearVue Power Port, MRI safe, 8-French.      SURGEON:  Jina Nephew, MD      ANESTHESIA:  General   FINDINGS:  Good venous return, easy flush, and tip of the catheter and   SVC 20.5 cm.      SPECIMEN:  None.      ESTIMATED BLOOD LOSS:  Minimal.      COMPLICATIONS:  None known.      PROCEDURE:  Pt was identified in the holding area and taken to   the operating room, where patient was placed supine on the operating room   table.  General anesthesia was induced.  Patient's arms were tucked and the upper   chest and neck were prepped and draped in sterile fashion.  Time-out was   performed according to the surgical safety check list.  When all was   correct, we continued.   Local anesthetic was administered just under the angle of the left clavicle.  The vein was accessed with 1 pass(es) of the needle. There was good venous return and the wire passed easily with no ectopy.   Fluoroscopy was used to confirm that the wire was in the vena cava.      The patient was placed back level and the area for the pocket was anethetized   with local anesthetic.  A 3-cm transverse incision was made at the prior port site with a #15   blade.  Cautery was used to divide the subcutaneous tissues down to the   pectoralis muscle.  An [employer] retractor was used to elevate the skin   while a pocket was created on top of the pectoralis fascia.  The port   was placed into the pocket to confirm that it was of adequate size.  The   catheter was preattached to the port.  The port was then secured to the   pectoralis fascia with four 2-0 Prolene sutures.  These were clamped and   not tied down yet.    The catheter was tunneled through to the wire exit   site.  The catheter was placed along the wire to determine what length it should be to be in the SVC.  The catheter was  cut at 20.5 cm.  The tunneler sheath and dilator were passed over the wire and the dilator and wire were removed.  The catheter was advanced through the tunneler sheath and the tunneler sheath was pulled away.  Care was taken to keep the catheter in the tunneler sheath as this occurred. This was advanced and the tunneler sheath was removed.  There was good venous   return and easy flush of the catheter.  The Prolene sutures were tied   down to the pectoral fascia.  The skin was reapproximated using 3-0   Vicryl interrupted deep dermal sutures.    Fluoroscopy was used to re-confirm good position of the catheter.  The skin   was then closed using 4-0 Monocryl in a subcuticular fashion.  The patient's port was left accessed.  A biopatch was placed just around the needle and the catheter was then dressed with 4x4s and tegaderms.   The port was flushed with concentrated heparin  flush as well.  The wounds were then cleaned, dried, and dressed with Dermabond.  The patient was awakened from anesthesia and taken to the PACU in stable  condition.  Needle, sponge, and instrument counts were correct.               Jina Nephew, MD

## 2024-03-27 NOTE — Transfer of Care (Signed)
 Immediate Anesthesia Transfer of Care Note  Patient: Suzanne Tucker  Procedure(s) Performed: INSERTION, TUNNELED CENTRAL VENOUS DEVICE, WITH PORT (Left)  Patient Location: PACU  Anesthesia Type:General  Level of Consciousness: awake, alert , and oriented  Airway & Oxygen Therapy: Patient Spontanous Breathing and Patient connected to face mask oxygen  Post-op Assessment: Report given to RN and Post -op Vital signs reviewed and stable  Post vital signs: Reviewed and stable  Last Vitals:  Vitals Value Taken Time  BP    Temp 36.2 C 03/27/24 15:45  Pulse 94 03/27/24 15:47  Resp 28 03/27/24 15:47  SpO2 100 % 03/27/24 15:47  Vitals shown include unfiled device data.  Last Pain:  Vitals:   03/27/24 1036  TempSrc: Temporal  PainSc: 0-No pain      Patients Stated Pain Goal: 3 (03/27/24 1036)  Complications: No notable events documented.

## 2024-03-28 ENCOUNTER — Inpatient Hospital Stay

## 2024-03-28 ENCOUNTER — Ambulatory Visit (HOSPITAL_COMMUNITY)
Admission: RE | Admit: 2024-03-28 | Discharge: 2024-03-28 | Disposition: A | Discharge status: Home / Self Care | Source: Ambulatory Visit | Attending: Cardiology | Admitting: Cardiology

## 2024-03-28 ENCOUNTER — Encounter (HOSPITAL_COMMUNITY): Payer: Self-pay

## 2024-03-28 ENCOUNTER — Other Ambulatory Visit: Payer: Self-pay | Admitting: Pharmacist

## 2024-03-28 ENCOUNTER — Telehealth: Payer: Self-pay | Admitting: Hematology and Oncology

## 2024-03-28 ENCOUNTER — Encounter (HOSPITAL_BASED_OUTPATIENT_CLINIC_OR_DEPARTMENT_OTHER): Payer: Self-pay | Admitting: General Surgery

## 2024-03-28 ENCOUNTER — Encounter: Payer: Self-pay | Admitting: Hematology and Oncology

## 2024-03-28 ENCOUNTER — Encounter: Payer: Self-pay | Admitting: General Practice

## 2024-03-28 ENCOUNTER — Encounter: Payer: Self-pay | Admitting: Pharmacist

## 2024-03-28 ENCOUNTER — Other Ambulatory Visit: Payer: Self-pay | Admitting: *Deleted

## 2024-03-28 VITALS — BP 105/64 | HR 70 | Temp 97.6°F | Resp 20

## 2024-03-28 DIAGNOSIS — C50311 Malignant neoplasm of lower-inner quadrant of right female breast: Secondary | ICD-10-CM | POA: Insufficient documentation

## 2024-03-28 DIAGNOSIS — I341 Nonrheumatic mitral (valve) prolapse: Secondary | ICD-10-CM | POA: Diagnosis not present

## 2024-03-28 DIAGNOSIS — Z0189 Encounter for other specified special examinations: Secondary | ICD-10-CM | POA: Diagnosis not present

## 2024-03-28 DIAGNOSIS — Z171 Estrogen receptor negative status [ER-]: Secondary | ICD-10-CM

## 2024-03-28 DIAGNOSIS — Z95828 Presence of other vascular implants and grafts: Secondary | ICD-10-CM

## 2024-03-28 LAB — CMP (CANCER CENTER ONLY)
ALT: 6 U/L (ref 0–44)
AST: 13 U/L — ABNORMAL LOW (ref 15–41)
Albumin: 4.2 g/dL (ref 3.5–5.0)
Alkaline Phosphatase: 70 U/L (ref 38–126)
Anion gap: 7 (ref 5–15)
BUN: 8 mg/dL (ref 6–20)
CO2: 24 mmol/L (ref 22–32)
Calcium: 9.1 mg/dL (ref 8.9–10.3)
Chloride: 105 mmol/L (ref 98–111)
Creatinine: 0.55 mg/dL (ref 0.44–1.00)
GFR, Estimated: >60 mL/min (ref >60)
Glucose, Bld: 94 mg/dL (ref 70–99)
Potassium: 3.9 mmol/L (ref 3.5–5.1)
Sodium: 136 mmol/L (ref 135–145)
Total Bilirubin: 0.5 mg/dL (ref 0.0–1.2)
Total Protein: 6.9 g/dL (ref 6.5–8.1)

## 2024-03-28 LAB — ECHOCARDIOGRAM COMPLETE
Area-P 1/2: 3.64 cm2
S' Lateral: 2.3 cm

## 2024-03-28 LAB — CBC WITH DIFFERENTIAL (CANCER CENTER ONLY)
Abs Immature Granulocytes: 0.02 10*3/uL (ref 0.00–0.07)
Basophils Absolute: 0 10*3/uL (ref 0.0–0.1)
Basophils Relative: 0 %
Eosinophils Absolute: 0 10*3/uL (ref 0.0–0.5)
Eosinophils Relative: 0 %
HCT: 30.8 % — ABNORMAL LOW (ref 36.0–46.0)
Hemoglobin: 10.6 g/dL — ABNORMAL LOW (ref 12.0–15.0)
Immature Granulocytes: 0 %
Lymphocytes Relative: 8 %
Lymphs Abs: 0.5 10*3/uL — ABNORMAL LOW (ref 0.7–4.0)
MCH: 33.1 pg (ref 26.0–34.0)
MCHC: 34.4 g/dL (ref 30.0–36.0)
MCV: 96.3 fL (ref 80.0–100.0)
Monocytes Absolute: 0.5 10*3/uL (ref 0.1–1.0)
Monocytes Relative: 9 %
Neutro Abs: 4.8 10*3/uL (ref 1.7–7.7)
Neutrophils Relative %: 83 %
Platelet Count: 179 10*3/uL (ref 150–400)
RBC: 3.2 MIL/uL — ABNORMAL LOW (ref 3.87–5.11)
RDW: 13.2 % (ref 11.5–15.5)
WBC Count: 5.8 10*3/uL (ref 4.0–10.5)
nRBC: 0 % (ref 0.0–0.2)

## 2024-03-28 LAB — CYTOLOGY - NON PAP

## 2024-03-28 MED ORDER — DEXAMETHASONE SODIUM PHOSPHATE 10 MG/ML IJ SOLN
10.0000 mg | Freq: Once | INTRAMUSCULAR | Status: AC
  Administered 2024-03-28: 10 mg via INTRAVENOUS
  Filled 2024-03-28: qty 1

## 2024-03-28 MED ORDER — DEXTROSE 5 % IV SOLN: INTRAVENOUS | Status: DC

## 2024-03-28 MED ORDER — PALONOSETRON HCL INJECTION 0.25 MG/5ML
0.2500 mg | Freq: Once | INTRAVENOUS | Status: AC
  Administered 2024-03-28: 0.25 mg via INTRAVENOUS
  Filled 2024-03-28: qty 5

## 2024-03-28 MED ORDER — SODIUM CHLORIDE 0.9% FLUSH
10.0000 mL | INTRAVENOUS | Status: DC | PRN
Start: 2024-03-28 — End: 2024-03-28
  Administered 2024-03-28: 10 mL

## 2024-03-28 MED ORDER — SODIUM CHLORIDE 0.9% FLUSH
10.0000 mL | Freq: Once | INTRAVENOUS | Status: AC
  Administered 2024-03-28: 10 mL

## 2024-03-28 MED ORDER — DIPHENHYDRAMINE HCL 25 MG PO CAPS
25.0000 mg | ORAL_CAPSULE | Freq: Once | ORAL | Status: AC
  Administered 2024-03-28: 25 mg via ORAL
  Filled 2024-03-28: qty 1

## 2024-03-28 MED ORDER — ACETAMINOPHEN 325 MG PO TABS
650.0000 mg | ORAL_TABLET | Freq: Once | ORAL | Status: AC
  Administered 2024-03-28: 650 mg via ORAL
  Filled 2024-03-28: qty 2

## 2024-03-28 MED ORDER — APREPITANT 130 MG/18ML IV EMUL
130.0000 mg | Freq: Once | INTRAVENOUS | Status: AC
  Administered 2024-03-28: 130 mg via INTRAVENOUS
  Filled 2024-03-28: qty 18

## 2024-03-28 MED ORDER — FAM-TRASTUZUMAB DERUXTECAN-NXKI CHEMO 100 MG IV SOLR
5.4000 mg/kg | Freq: Once | INTRAVENOUS | Status: AC
  Administered 2024-03-28: 360 mg via INTRAVENOUS
  Filled 2024-03-28: qty 18

## 2024-03-28 MED ORDER — HEPARIN SOD (PORK) LOCK FLUSH 100 UNIT/ML IV SOLN
500.0000 [IU] | Freq: Once | INTRAVENOUS | Status: AC | PRN
  Administered 2024-03-28: 500 [IU]

## 2024-03-28 NOTE — Progress Notes (Signed)
 [facility] Spiritual Care Note  Followed up with Suzanne Tucker in infusion while she had multiple staff visitors, so kept the encounter brief. She notes that her daughter is understandably having a hard time with her prognosis and is receiving counseling support via [facility]. Suzanne Tucker describes herself as adjusting to her prognosis and hoping to be one of the five-year survivors so that she can see her child through high school graduation.   Suzanne Tucker values social/emotional check-ins. We plan to continue to visit at future treatments.   [address] Suzanne Tucker, [place], [facility] Pager [phone] Voicemail [phone]

## 2024-03-28 NOTE — Telephone Encounter (Signed)
 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  It does not have any targeted therapy. 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 I discussed with her the ultralow HER2 population still would benefit from [facility] and therefore we are to continue with the same treatment plan. It appears that she tolerated today's treatment well.

## 2024-03-28 NOTE — Patient Instructions (Signed)
 [facility] - A DEPT OF [facility]  Discharge Instructions: Thank you for choosing [facility] to provide your oncology and hematology care.   If you have a lab appointment with the Cancer Center, please go directly to the Cancer Center and check in at the registration area.   Wear comfortable clothing and clothing appropriate for easy access to any Portacath or PICC line.   We strive to give you quality time with your provider. You may need to reschedule your appointment if you arrive late (15 or more minutes).  Arriving late affects you and other patients whose appointments are after yours.  Also, if you miss three or more appointments without notifying the office, you may be dismissed from the clinic at the provider's discretion.      For prescription refill requests, have your pharmacy contact our office and allow 72 hours for refills to be completed.    Today you received the following chemotherapy and/or immunotherapy agents Enhertu      To help prevent nausea and vomiting after your treatment, we encourage you to take your nausea medication as directed.  BELOW ARE SYMPTOMS THAT SHOULD BE REPORTED IMMEDIATELY: *FEVER GREATER THAN 100.4 F (38 C) OR HIGHER *CHILLS OR SWEATING *NAUSEA AND VOMITING THAT IS NOT CONTROLLED WITH YOUR NAUSEA MEDICATION *UNUSUAL SHORTNESS OF BREATH *UNUSUAL BRUISING OR BLEEDING *URINARY PROBLEMS (pain or burning when urinating, or frequent urination) *BOWEL PROBLEMS (unusual diarrhea, constipation, pain near the anus) TENDERNESS IN MOUTH AND THROAT WITH OR WITHOUT PRESENCE OF ULCERS (sore throat, sores in mouth, or a toothache) UNUSUAL RASH, SWELLING OR PAIN  UNUSUAL VAGINAL DISCHARGE OR ITCHING   Items with * indicate a potential emergency and should be followed up as soon as possible or go to the Emergency Department if any problems should occur.  Please show the CHEMOTHERAPY ALERT CARD or IMMUNOTHERAPY  ALERT CARD at check-in to the Emergency Department and triage nurse.  Should you have questions after your visit or need to cancel or reschedule your appointment, please contact [facility] - [facility]  Dept: [phone]  and follow the prompts.  Office hours are 8:00 a.m. to 4:30 p.m. Monday - Friday. Please note that voicemails left after 4:00 p.m. may not be returned until the following business day.  We are closed weekends and major holidays. You have access to a nurse at all times for urgent questions. Please call the main number to the clinic Dept: [phone] and follow the prompts.   For any non-urgent questions, you may also contact your provider using MyChart. We now offer e-Visits for anyone 58 and older to request care online for non-urgent symptoms. For details visit [email or web address].   Also download the MyChart app! Go to the app store, search "MyChart", open the app, select [facility], and log in with your MyChart username and password.  Fam-Trastuzumab Deruxtecan Injection What is this medication? FAM-TRASTUZUMAB DERUXTECAN (fam-tras TOOZ eu mab DER ux TEE kan) treats some types of cancer. It works by blocking a protein that causes cancer cells to grow and multiply. This helps to slow or stop the spread of cancer cells. This medicine may be used for other purposes; ask your health care provider or pharmacist if you have questions. COMMON BRAND NAME(S): ENHERTU What should I tell my care team before I take this medication? They need to know if you have any of these  conditions: Heart disease Heart failure Infection, especially a viral infection, such as chickenpox, cold sores, or herpes Liver disease Lung or breathing disease, such as asthma or COPD An unusual or allergic reaction to fam-trastuzumab deruxtecan, other medications, foods, dyes, or preservatives Pregnant or trying to get pregnant Breast-feeding How should I use  this medication? This medication is injected into a vein. It is given by your care team in a hospital or clinic setting. A special MedGuide will be given to you before each treatment. Be sure to read this information carefully each time. Talk to your care team about the use of this medication in children. Special care may be needed. Overdosage: If you think you have taken too much of this medicine contact a poison control center or emergency room at once. NOTE: This medicine is only for you. Do not share this medicine with others. What if I miss a dose? It is important not to miss your dose. Call your care team if you are unable to keep an appointment. What may interact with this medication? Interactions are not expected. This list may not describe all possible interactions. Give your health care provider a list of all the medicines, herbs, non-prescription drugs, or dietary supplements you use. Also tell them if you smoke, drink alcohol, or use illegal drugs. Some items may interact with your medicine. What should I watch for while using this medication? Visit your care team for regular checks on your progress. Tell your care team if your symptoms do not start to get better or if they get worse. This medication may increase your risk of getting an infection. Call your care team for advice if you get a fever, chills, sore throat, or other symptoms of a cold or flu. Do not treat yourself. Try to avoid being around people who are sick. Avoid taking medications that contain aspirin, acetaminophen, ibuprofen, naproxen, or ketoprofen unless instructed by your care team. These medications may hide a fever. Be careful brushing or flossing your teeth or using a toothpick because you may get an infection or bleed more easily. If you have any dental work done, tell your dentist you are receiving this medication. This medication may cause dry eyes and blurred vision. If you wear contact lenses, you may feel  some discomfort. Lubricating eye drops may help. See your care team if the problem does not go away or is severe. Talk to your care team if you may be pregnant. Serious birth defects can occur if you take this medication during pregnancy and for 7 months after the last dose. If your partner can get pregnant, use a condom during sex while taking this medication and for 4 months after the last dose. Do not breastfeed while taking this medication and for 7 months after the last dose. This medication may cause infertility. Talk to your care team if you are concerned about your fertility. What side effects may I notice from receiving this medication? Side effects that you should report to your care team as soon as possible: Allergic reactions--skin rash, itching, hives, swelling of the face, lips, tongue, or throat Dry cough, shortness of breath or trouble breathing Infection--fever, chills, cough, sore throat, wounds that don't heal, pain or trouble when passing urine, general feeling of discomfort or being unwell Heart failure--shortness of breath, swelling of the ankles, feet, or hands, sudden weight gain, unusual weakness or fatigue Unusual bruising or bleeding Side effects that usually do not require medical attention (report these to your care  team if they continue or are bothersome): Constipation Diarrhea Hair loss Muscle pain Nausea Vomiting This list may not describe all possible side effects. Call your doctor for medical advice about side effects. You may report side effects to FDA at 1-800-FDA-1088. Where should I keep my medication? This medication is given in a hospital or clinic. It will not be stored at home. NOTE: This sheet is a summary. It may not cover all possible information. If you have questions about this medicine, talk to your doctor, pharmacist, or health care provider.  2024 Elsevier/Gold Standard (2023-05-20 00:00:00)

## 2024-03-29 ENCOUNTER — Telehealth: Payer: Self-pay

## 2024-03-29 ENCOUNTER — Other Ambulatory Visit

## 2024-03-29 ENCOUNTER — Encounter: Admitting: Adult Health

## 2024-03-29 NOTE — Telephone Encounter (Signed)
 Suzanne Tucker states that she is doing fine. She is eating, drinking, and urinating well. She knows to call the office at [phone] if  she has any questions or concerns.

## 2024-03-29 NOTE — Telephone Encounter (Signed)
 Dr. Gudena - first time Enhertu f/u call - pt tolerated well Received: Nilsa Metro Katrinka DELENA, RN  P Onc Triage Nurse [facility] Caller: Unspecified (Yesterday, 12:03 PM)

## 2024-03-30 ENCOUNTER — Telehealth: Payer: Self-pay

## 2024-03-30 LAB — GUARDANT 360

## 2024-03-30 NOTE — Telephone Encounter (Signed)
 Suzanne Tucker states she is fine now, but still has questions about Enhertu  efficacy. She was scheduled a phone visit with Dr Odean 6/30 at 0915 to discuss. MD aware.

## 2024-04-01 NOTE — Assessment & Plan Note (Signed)
 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%, HER2 1+  01/25/2023: PET/CT: Right breast cancer, right axilla, retropectoral and right cervical lymph nodes   Treatment plan: Neoadjuvant chemotherapy with Taxol  carbo Keytruda  weekly x 12 followed by Adriamycin  Cytoxan  Keytruda  followed by Keytruda  maintenance until 12/30/2023 07/27/2023: Bilateral mastectomies: Left mastectomy: Benign, right mastectomy: No evidence of residual cancer, 8/20 lymph nodes positive with extranodal extension in 2 lymph nodes Adjuvant radiation therapy 09/08/2023-10/24/2022 Capecitabine  (completed June 2025) with Keytruda  (completed 12/29/2023) --------------------------------------------------------------------------------------- 03/09/2024: PET/CT: 4 mm node right axilla SUV 3.7, right subpectoral activity SUV 3.4 (possible 5 mm node) right apical 7 mm nodule is new compared to 12/23/2023 with an SUV of 2.3, right upper lobe 1 cm nodule new with a SUV of 3.6, left upper lobe nodule 7 mm SUV 4.2, soft tissue fullness proximal duodenum SUV 8.7 (possible duodenitis)   EGD: Bx: Duodenitis Lung FNA: malignant cells consistent with breast primary (Prog panel and Caris pending) Brain MRI: Neg   Treatment plan: Enhertu 

## 2024-04-02 ENCOUNTER — Inpatient Hospital Stay (HOSPITAL_BASED_OUTPATIENT_CLINIC_OR_DEPARTMENT_OTHER): Admitting: Hematology and Oncology

## 2024-04-02 DIAGNOSIS — Z171 Estrogen receptor negative status [ER-]: Secondary | ICD-10-CM | POA: Diagnosis not present

## 2024-04-02 DIAGNOSIS — C50311 Malignant neoplasm of lower-inner quadrant of right female breast: Secondary | ICD-10-CM | POA: Diagnosis not present

## 2024-04-02 MED ORDER — NYSTATIN 100000 UNIT/ML MT SUSP
5.0000 mL | Freq: Four times a day (QID) | OROMUCOSAL | 0 refills | Status: DC
Start: 2024-04-02 — End: 2024-07-12

## 2024-04-02 NOTE — Progress Notes (Signed)
 HEMATOLOGY-ONCOLOGY TELEPHONE VISIT PROGRESS NOTE  I connected with our patient on 04/02/24 at  9:15 AM EDT by telephone and verified that I am speaking with the correct person using two identifiers.  I discussed the limitations, risks, security and privacy concerns of performing an evaluation and management service by telephone and the availability of in person appointments.  I also discussed with the patient that there may be a patient responsible charge related to this service. The patient expressed understanding and agreed to proceed.   History of Present Illness: Telephone follow-up to discuss side effects to Enhertu   History of Present Illness   Carolyn Alexandra Worland is a 43 year old female with breast cancer who presents with lightheadedness and throat discomfort.  She experiences significant lightheadedness, particularly over the weekend, requiring her to sit down when transitioning from the car to different environments. She feels weak and tired but reports improvement today.  She has 'really big red splotches' in the back of her throat, specifically on the left tonsil, and a small white area on the right cheek. Swallowing is sore, but not severe. No fever and only a slight stuffy ear on the left side.  She is currently receiving the full dose of Enhertu  for breast cancer with distant metastases and is tolerating the treatment well with no nausea.    Oncology History  Malignant neoplasm of lower-inner quadrant of right breast of female, estrogen receptor negative (HCC)  01/10/2023 Initial Diagnosis   Palpable lump right axilla x 2 weeks bulky axillary lymphadenopathy ultrasound breast: 2 masses at 4 o'clock position 1.1 cm and 0.6 cm multiple axillary lymph nodes largest 4.5 cm also level 2 and 3 (subpectoral lymph node as well): Biopsy: Grade 3 IDC ER 0%, PR 0%, Ki-67 95%, HER2 1+   01/26/2023 Genetic Testing   Negative [employer] Custom Panel (37 genes).  Report date is 01/26/2023.     The [employer] Custom Cancers + RNA Panel includes sequencing, deletion/duplication, and RNA analysis of the following 37 genes: APC, ATM, AXIN2, BARD1, BMPR1A, BRCA1, BRCA2, BRIP1, CDH1, CDK4*, CDKN2A (p14ARF)*, CDKN2A (p16INK4a), CHEK2, CTNNA1, DICER1, EPCAM*, FH, GREM1*, HOXB13*, MBD4*, MLH1, MSH2, MSH3, MSH6, MUTYH, NF1, NTHL1, PALB2, PMS2, POLD1, POLE, PTEN, RAD51C, RAD51D, SMAD4, SMARCA4, STK11, TP53.  *Genes without RNA analysis.    01/28/2023 - 06/27/2023 Chemotherapy   Patient is on Treatment Plan : BREAST Pembrolizumab  (200) D1 + Carboplatin  (1.5) D1,8,15 + Paclitaxel  (80) D1,8,15 q21d X 4 cycles / Pembrolizumab  (200) D1 + AC D1 q21d x 4 cycles     07/15/2023 - 12/29/2023 Chemotherapy   Patient is on Treatment Plan : BREAST Pembrolizumab  (200) q21d x 27 weeks     09/07/2023 - 10/26/2023 Radiation Therapy   First Treatment Date: 2023-09-07 Last Treatment Date: 2023-10-26   Plan Name: CW_R_BO Site: Chest Wall, Right Technique: 3D Mode: Photon Dose Per Fraction: 1.8 Gy Prescribed Dose (Delivered / Prescribed): 25.2 Gy / 25.2 Gy Prescribed Fxs (Delivered / Prescribed): 14 / 14   Plan Name: CW_R_Neck_Ax Site: Chest Wall, Right Technique: 3D Mode: Photon Dose Per Fraction: 1.8 Gy Prescribed Dose (Delivered / Prescribed): 50.4 Gy / 50.4 Gy Prescribed Fxs (Delivered / Prescribed): 28 / 28   Plan Name: CW_R_Bst Site: Chest Wall, Right Technique: 3D Mode: Photon Dose Per Fraction: 1.8 Gy Prescribed Dose (Delivered / Prescribed): 9 Gy / 9 Gy Prescribed Fxs (Delivered / Prescribed): 5 / 5   Plan Name: CW_R Site: Chest Wall, Right Technique: 3D Mode: Photon Dose Per Fraction: 1.8 Gy  Prescribed Dose (Delivered / Prescribed): 25.2 Gy / 25.2 Gy Prescribed Fxs (Delivered / Prescribed): 14 / 14     03/28/2024 -  Chemotherapy   Patient is on Treatment Plan : BREAST Fam-Trastuzumab Deruxtecan-nxki  (Enhertu ) (5.4) q21d       REVIEW OF SYSTEMS:   Constitutional: Denies fevers,  chills or abnormal weight loss All other systems were reviewed with the patient and are negative. Observations/Objective:     Assessment Plan:  Malignant neoplasm of lower-inner quadrant of right breast of female, estrogen receptor negative (HCC) 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%, HER2 1+  01/25/2023: PET/CT: Right breast cancer, right axilla, retropectoral and right cervical lymph nodes   Treatment plan: Neoadjuvant chemotherapy with Taxol  carbo Keytruda  weekly x 12 followed by Adriamycin  Cytoxan  Keytruda  followed by Keytruda  maintenance until 12/30/2023 07/27/2023: Bilateral mastectomies: Left mastectomy: Benign, right mastectomy: No evidence of residual cancer, 8/20 lymph nodes positive with extranodal extension in 2 lymph nodes Adjuvant radiation therapy 09/08/2023-10/24/2022 Capecitabine  (completed June 2025) with Keytruda  (completed 12/29/2023) --------------------------------------------------------------------------------------- 03/09/2024: PET/CT: 4 mm node right axilla SUV 3.7, right subpectoral activity SUV 3.4 (possible 5 mm node) right apical 7 mm nodule is new compared to 12/23/2023 with an SUV of 2.3, right upper lobe 1 cm nodule new with a SUV of 3.6, left upper lobe nodule 7 mm SUV 4.2, soft tissue fullness proximal duodenum SUV 8.7 (possible duodenitis)   EGD: Bx: Duodenitis Lung FNA: malignant cells consistent with breast primary (triple negative) [facility] molecular testing: T p53, TMB 10.3 Brain MRI: Neg   Treatment plan: Enhertu  started 03/28/24 Toxicities: Fatigue Throat redness: Suspect thrush I sent a prescription for nystatin.  No nausea After 3 cycles of Enhertu  we will plan to do scans Assessment & Plan Breast cancer with  distant metastases Undergoing Enhertu  treatment, tolerating well. TP53 mutation and tumor mutation burden of 10.03 noted, indicating potential for future immunotherapy. - Administer Enhertu  as scheduled. - Perform scan on August 20th post-third dose. - Consider future immunotherapy based on tumor mutation burden.  Oral thrush Red splotches on left tonsil, white patches on right cheek, mild throat soreness. - Prescribe nystatin swish and swallow.      I discussed the assessment and treatment plan with the patient. The patient was provided an opportunity to ask questions and all were answered. The patient agreed with the plan and demonstrated an understanding of the instructions. The patient was advised to call back or seek an in-person evaluation if the symptoms worsen or if the condition fails to improve as anticipated.   I provided 20 minutes of non-face-to-face time during this encounter.  This includes time for charting and coordination of care   Naomi MARLA Chad, MD

## 2024-04-03 ENCOUNTER — Other Ambulatory Visit: Payer: Self-pay | Admitting: *Deleted

## 2024-04-03 MED ORDER — CIPROFLOXACIN HCL 500 MG PO TABS: 500.0000 mg | ORAL_TABLET | Freq: Two times a day (BID) | ORAL | 0 refills | Status: DC

## 2024-04-03 NOTE — Progress Notes (Signed)
 Per Dr Odean prescribe Cipro  500 mg BID quantity 14 for a total of 7 days.  Sent to [facility] on file.  Patient was instructed to keep neutropenic precautions in mind while she is away and to report back if she does not see any improvement.

## 2024-04-09 ENCOUNTER — Encounter: Payer: Self-pay | Admitting: General Practice

## 2024-04-09 NOTE — Progress Notes (Signed)
 [facility] Spiritual Care Note  Followed up with Neenah by phone for Spiritual Care check-in and opportunity for 1:1 reflection. She is processing a big change in family needs/communication/support and valued the opportunity to speak about it, as well as chaplain's affirmation of her strengths as she navigates this challenge.  We plan to follow up by phone periodically for more private conversations than infusion visits generally allow.   04/09/24 1600  Spiritual Encounters  Type of Visit Follow up  Care provided to: Patient  Reason for visit Routine spiritual support  Spiritual Framework  Presenting Themes Values and beliefs;Significant life change;Impactful experiences and emotions;Courage hope and growth;Community and relationships  Community/Connection Family;Friend(s)  Strengths self-awareness, self-reflection, supportive attitude, use of tools  Patient Stress Factors Family relationships;Major life changes  Family Stress Factors Family relationships;Health changes;Major life changes  Interventions  Spiritual Care Interventions Made Compassionate presence;Reflective listening;Normalization of emotions;Explored values/beliefs/practices/strengths;Encouragement    Chaplain Olam Filiberto Lemming, [facility] Pager [phone] Voicemail [phone]

## 2024-04-13 ENCOUNTER — Encounter (HOSPITAL_COMMUNITY): Payer: Self-pay

## 2024-04-13 ENCOUNTER — Ambulatory Visit (INDEPENDENT_AMBULATORY_CARE_PROVIDER_SITE_OTHER): Admitting: Audiology

## 2024-04-16 ENCOUNTER — Telehealth: Payer: Self-pay | Admitting: *Deleted

## 2024-04-16 MED ORDER — FLUCONAZOLE 100 MG PO TABS: 100.0000 mg | ORAL_TABLET | Freq: Every day | ORAL | 1 refills | Status: DC

## 2024-04-16 NOTE — Telephone Encounter (Signed)
 This RN obtained prescription for diflucan  to start with decadron  for prevention of thrush.  Discussed daily use - then if thrush does not occur may stop 7 days post starting med.  If thrush occurs and is controllable she is to continue until resolved + 2 days.  Discussed benefit of use of salt water  rinses as well as coconut oil .

## 2024-04-17 NOTE — Assessment & Plan Note (Signed)
 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%, HER2 1+  01/25/2023: PET/CT: Right breast cancer, right axilla, retropectoral and right cervical lymph nodes   Treatment plan: Neoadjuvant chemotherapy with Taxol  carbo Keytruda  weekly x 12 followed by Adriamycin  Cytoxan  Keytruda  followed by Keytruda  maintenance until 12/30/2023 07/27/2023: Bilateral mastectomies: Left mastectomy: Benign, right mastectomy: No evidence of residual cancer, 8/20 lymph nodes positive with extranodal extension in 2 lymph nodes Adjuvant radiation therapy 09/08/2023-10/24/2022 Capecitabine  (completed June 2025) with Keytruda  (completed 12/29/2023) --------------------------------------------------------------------------------------- 03/09/2024: PET/CT: 4 mm node right axilla SUV 3.7, right subpectoral activity SUV 3.4 (possible 5 mm node) right apical 7 mm nodule is new compared to 12/23/2023 with an SUV of 2.3, right upper lobe 1 cm nodule new with a SUV of 3.6, left upper lobe nodule 7 mm SUV 4.2, soft tissue fullness proximal duodenum SUV 8.7 (possible duodenitis)   EGD: Bx: Duodenitis Lung FNA: malignant cells consistent with breast primary (triple negative) [facility] molecular testing: T p53, TMB 10.3 Brain MRI: Neg   Treatment plan: Enhertu  started 03/28/24, today is cycle 2 Toxicities: Fatigue Throat redness: Suspect thrush I sent a prescription for nystatin .   No nausea After 3 cycles of Enhertu  we will plan to do scans

## 2024-04-18 ENCOUNTER — Encounter: Payer: Self-pay | Admitting: Hematology and Oncology

## 2024-04-18 ENCOUNTER — Encounter: Payer: Self-pay | Admitting: General Practice

## 2024-04-18 ENCOUNTER — Inpatient Hospital Stay

## 2024-04-18 ENCOUNTER — Inpatient Hospital Stay: Attending: Hematology and Oncology

## 2024-04-18 ENCOUNTER — Inpatient Hospital Stay (HOSPITAL_BASED_OUTPATIENT_CLINIC_OR_DEPARTMENT_OTHER): Admitting: Hematology and Oncology

## 2024-04-18 VITALS — BP 110/72 | HR 130 | Temp 98.5°F | Resp 18 | Ht 61.0 in | Wt 148.5 lb

## 2024-04-18 VITALS — HR 64

## 2024-04-18 DIAGNOSIS — Z171 Estrogen receptor negative status [ER-]: Secondary | ICD-10-CM | POA: Diagnosis not present

## 2024-04-18 DIAGNOSIS — Z5112 Encounter for antineoplastic immunotherapy: Secondary | ICD-10-CM | POA: Insufficient documentation

## 2024-04-18 DIAGNOSIS — Z923 Personal history of irradiation: Secondary | ICD-10-CM | POA: Diagnosis not present

## 2024-04-18 DIAGNOSIS — Z9221 Personal history of antineoplastic chemotherapy: Secondary | ICD-10-CM | POA: Insufficient documentation

## 2024-04-18 DIAGNOSIS — C50311 Malignant neoplasm of lower-inner quadrant of right female breast: Secondary | ICD-10-CM | POA: Diagnosis present

## 2024-04-18 DIAGNOSIS — Z95828 Presence of other vascular implants and grafts: Secondary | ICD-10-CM

## 2024-04-18 DIAGNOSIS — Z79899 Other long term (current) drug therapy: Secondary | ICD-10-CM | POA: Diagnosis not present

## 2024-04-18 LAB — CBC WITH DIFFERENTIAL (CANCER CENTER ONLY)
Abs Immature Granulocytes: 0.01 K/uL (ref 0.00–0.07)
Basophils Absolute: 0 K/uL (ref 0.0–0.1)
Basophils Relative: 0 %
Eosinophils Absolute: 0.6 K/uL — ABNORMAL HIGH (ref 0.0–0.5)
Eosinophils Relative: 25 %
HCT: 27.7 % — ABNORMAL LOW (ref 36.0–46.0)
Hemoglobin: 9.6 g/dL — ABNORMAL LOW (ref 12.0–15.0)
Immature Granulocytes: 0 %
Lymphocytes Relative: 19 %
Lymphs Abs: 0.5 K/uL — ABNORMAL LOW (ref 0.7–4.0)
MCH: 32.5 pg (ref 26.0–34.0)
MCHC: 34.7 g/dL (ref 30.0–36.0)
MCV: 93.9 fL (ref 80.0–100.0)
Monocytes Absolute: 0.3 K/uL (ref 0.1–1.0)
Monocytes Relative: 12 %
Neutro Abs: 1.1 K/uL — ABNORMAL LOW (ref 1.7–7.7)
Neutrophils Relative %: 44 %
Platelet Count: 139 K/uL — ABNORMAL LOW (ref 150–400)
RBC: 2.95 MIL/uL — ABNORMAL LOW (ref 3.87–5.11)
RDW: 14 % (ref 11.5–15.5)
WBC Count: 2.4 K/uL — ABNORMAL LOW (ref 4.0–10.5)
nRBC: 0 % (ref 0.0–0.2)

## 2024-04-18 LAB — CMP (CANCER CENTER ONLY)
ALT: 20 U/L (ref 0–44)
AST: 24 U/L (ref 15–41)
Albumin: 3.8 g/dL (ref 3.5–5.0)
Alkaline Phosphatase: 72 U/L (ref 38–126)
Anion gap: 6 (ref 5–15)
BUN: 12 mg/dL (ref 6–20)
CO2: 27 mmol/L (ref 22–32)
Calcium: 8.6 mg/dL — ABNORMAL LOW (ref 8.9–10.3)
Chloride: 108 mmol/L (ref 98–111)
Creatinine: 0.58 mg/dL (ref 0.44–1.00)
GFR, Estimated: >60 mL/min (ref >60)
Glucose, Bld: 89 mg/dL (ref 70–99)
Potassium: 3.1 mmol/L — ABNORMAL LOW (ref 3.5–5.1)
Sodium: 141 mmol/L (ref 135–145)
Total Bilirubin: 0.5 mg/dL (ref 0.0–1.2)
Total Protein: 6.4 g/dL — ABNORMAL LOW (ref 6.5–8.1)

## 2024-04-18 MED ORDER — SODIUM CHLORIDE 0.9% FLUSH
10.0000 mL | INTRAVENOUS | Status: DC | PRN
Start: 2024-04-18 — End: 2024-04-18
  Administered 2024-04-18: 10 mL

## 2024-04-18 MED ORDER — PALONOSETRON HCL INJECTION 0.25 MG/5ML
0.2500 mg | Freq: Once | INTRAVENOUS | Status: AC
  Administered 2024-04-18: 0.25 mg via INTRAVENOUS
  Filled 2024-04-18: qty 5

## 2024-04-18 MED ORDER — DIPHENHYDRAMINE HCL 25 MG PO CAPS
25.0000 mg | ORAL_CAPSULE | Freq: Once | ORAL | Status: AC
  Administered 2024-04-18: 25 mg via ORAL
  Filled 2024-04-18: qty 1

## 2024-04-18 MED ORDER — APREPITANT 130 MG/18ML IV EMUL
130.0000 mg | Freq: Once | INTRAVENOUS | Status: AC
  Administered 2024-04-18: 130 mg via INTRAVENOUS
  Filled 2024-04-18: qty 18

## 2024-04-18 MED ORDER — SODIUM CHLORIDE 0.9% FLUSH
10.0000 mL | Freq: Once | INTRAVENOUS | Status: AC
  Administered 2024-04-18: 10 mL

## 2024-04-18 MED ORDER — FAM-TRASTUZUMAB DERUXTECAN-NXKI CHEMO 100 MG IV SOLR
4.4000 mg/kg | Freq: Once | INTRAVENOUS | Status: AC
  Administered 2024-04-18: 300 mg via INTRAVENOUS
  Filled 2024-04-18: qty 15

## 2024-04-18 MED ORDER — PEGFILGRASTIM 6 MG/0.6ML ~~LOC~~ PSKT
6.0000 mg | PREFILLED_SYRINGE | Freq: Once | SUBCUTANEOUS | Status: AC
  Administered 2024-04-18: 6 mg via SUBCUTANEOUS
  Filled 2024-04-18: qty 0.6

## 2024-04-18 MED ORDER — ACETAMINOPHEN 325 MG PO TABS
650.0000 mg | ORAL_TABLET | Freq: Once | ORAL | Status: AC
  Administered 2024-04-18: 650 mg via ORAL
  Filled 2024-04-18: qty 2

## 2024-04-18 MED ORDER — DEXAMETHASONE SODIUM PHOSPHATE 10 MG/ML IJ SOLN
10.0000 mg | Freq: Once | INTRAMUSCULAR | Status: AC
  Administered 2024-04-18: 10 mg via INTRAVENOUS
  Filled 2024-04-18: qty 1

## 2024-04-18 MED ORDER — HEPARIN SOD (PORK) LOCK FLUSH 100 UNIT/ML IV SOLN
500.0000 [IU] | Freq: Once | INTRAVENOUS | Status: AC | PRN
Start: 2024-04-18 — End: 2024-04-18
  Administered 2024-04-18: 500 [IU]

## 2024-04-18 MED ORDER — DEXTROSE 5 % IV SOLN: INTRAVENOUS | Status: DC

## 2024-04-18 NOTE — Progress Notes (Signed)
 [facility] Spiritual Care Note  Visited briefly with Chasty in infusion today to provide social and emotional support. She continues to value pastoral check-ins, especially phone calls, when we can speak in more detail without interruption, so we will focus on those.  [address] [name], [place], [facility] Pager [phone] Voicemail [phone]

## 2024-04-18 NOTE — Patient Instructions (Signed)

## 2024-04-18 NOTE — Progress Notes (Signed)
 Patient Care Team: [name], MD as PCP - General (Hematology and Oncology) Dann Candyce RAMAN, MD as PCP - Cardiology (Cardiology) Fernande Elspeth BROCKS, MD as PCP - Electrophysiology (Cardiology) [name], MD as Consulting Physician (Hematology and Oncology) Izell Domino, MD as Attending Physician (Radiation Oncology) Aron Shoulders, MD as Consulting Physician (General Surgery) Tyree Nanetta SAILOR, RN as Oncology Nurse Navigator Glean, Stephane BROCKS, RN (Inactive) as Oncology Nurse Navigator  DIAGNOSIS:  Encounter Diagnosis  Name Primary?   Malignant neoplasm of lower-inner quadrant of right breast of female, estrogen receptor negative (HCC) Yes    SUMMARY OF ONCOLOGIC HISTORY: Oncology History  Malignant neoplasm of lower-inner quadrant of right breast of female, estrogen receptor negative (HCC)  01/10/2023 Initial Diagnosis   Palpable lump right axilla x 2 weeks bulky axillary lymphadenopathy ultrasound breast: 2 masses at 4 o'clock position 1.1 cm and 0.6 cm multiple axillary lymph nodes largest 4.5 cm also level 2 and 3 (subpectoral lymph node as well): Biopsy: Grade 3 IDC ER 0%, PR 0%, Ki-67 95%, HER2 1+   01/26/2023 Genetic Testing   Negative [employer] Custom Panel (37 genes).  Report date is 01/26/2023.    The [employer] Custom Cancers + RNA Panel includes sequencing, deletion/duplication, and RNA analysis of the following 37 genes: APC, ATM, AXIN2, BARD1, BMPR1A, BRCA1, BRCA2, BRIP1, CDH1, CDK4*, CDKN2A (p14ARF)*, CDKN2A (p16INK4a), CHEK2, CTNNA1, DICER1, EPCAM*, FH, GREM1*, HOXB13*, MBD4*, MLH1, MSH2, MSH3, MSH6, MUTYH, NF1, NTHL1, PALB2, PMS2, POLD1, POLE, PTEN, RAD51C, RAD51D, SMAD4, SMARCA4, STK11, TP53.  *Genes without RNA analysis.    01/28/2023 - 06/27/2023 Chemotherapy   Patient is on Treatment Plan : BREAST Pembrolizumab  (200) D1 + Carboplatin  (1.5) D1,8,15 + Paclitaxel  (80) D1,8,15 q21d X 4 cycles / Pembrolizumab  (200) D1 + AC D1 q21d x 4 cycles     07/15/2023 - 12/29/2023  Chemotherapy   Patient is on Treatment Plan : BREAST Pembrolizumab  (200) q21d x 27 weeks     09/07/2023 - 10/26/2023 Radiation Therapy   First Treatment Date: 2023-09-07 Last Treatment Date: 2023-10-26   Plan Name: CW_R_BO Site: Chest Wall, Right Technique: 3D Mode: Photon Dose Per Fraction: 1.8 Gy Prescribed Dose (Delivered / Prescribed): 25.2 Gy / 25.2 Gy Prescribed Fxs (Delivered / Prescribed): 14 / 14   Plan Name: CW_R_Neck_Ax Site: Chest Wall, Right Technique: 3D Mode: Photon Dose Per Fraction: 1.8 Gy Prescribed Dose (Delivered / Prescribed): 50.4 Gy / 50.4 Gy Prescribed Fxs (Delivered / Prescribed): 28 / 28   Plan Name: CW_R_Bst Site: Chest Wall, Right Technique: 3D Mode: Photon Dose Per Fraction: 1.8 Gy Prescribed Dose (Delivered / Prescribed): 9 Gy / 9 Gy Prescribed Fxs (Delivered / Prescribed): 5 / 5   Plan Name: CW_R Site: Chest Wall, Right Technique: 3D Mode: Photon Dose Per Fraction: 1.8 Gy Prescribed Dose (Delivered / Prescribed): 25.2 Gy / 25.2 Gy Prescribed Fxs (Delivered / Prescribed): 14 / 14     03/28/2024 -  Chemotherapy   Patient is on Treatment Plan : BREAST Fam-Trastuzumab Deruxtecan-nxki  (Enhertu ) (5.4) q21d       CHIEF COMPLIANT: Cycle 2 Enhertu   HISTORY OF PRESENT ILLNESS:  History of Present Illness Suzanne Tucker is a 43 year old female with a history of chemotherapy treatment who presents for follow-up regarding her treatment and associated symptoms.  She is undergoing chemotherapy treatment and is scheduled for her third infusion on August 8th. She reports a recent MRI and a drop in blood counts. She is concerned about hair thinning around the crown.  Throat  symptoms have improved after taking medication this morning. She experienced a headache for four to five days following a toothache and gum swelling, which has since subsided. She plans to consult her dentist for further evaluation.  She expresses concern about a bump  near her surgical scar, which has been evaluated by another medical professional.     ALLERGIES:  is allergic to antihistamines, loratadine-type; kiwi extract; morphine; oxycontin  [oxycodone ]; tape; tessalon [benzonatate]; tramadol ; codeine; hydrocodone; and latex.  MEDICATIONS:  Current Outpatient Medications  Medication Sig Dispense Refill   acetaminophen  (TYLENOL ) 500 MG tablet Take 1,000 mg by mouth every 6 (six) hours as needed for fever or mild pain (pain score 1-3).     ALPRAZolam  (XANAX ) 0.5 MG tablet Take 1 tablet (0.5 mg total) by mouth 3 (three) times daily as needed for anxiety. 30 tablet 1   calcium  carbonate (TUMS - DOSED IN MG ELEMENTAL CALCIUM ) 500 MG chewable tablet Chew 1,000 mg by mouth as needed for indigestion or heartburn.     ciprofloxacin  (CIPRO ) 500 MG tablet Take 1 tablet (500 mg total) by mouth 2 (two) times daily. 14 tablet 0   dexamethasone  (DECADRON ) 4 MG tablet Take 2 tablets (8 mg) by mouth daily for 3 days starting the day after chemotherapy. Take with food. 30 tablet 1   Dextrose -Fructose-Sod Citrate (NAUZENE) [id number] MG CHEW Chew 1 tablet by mouth daily as needed (Nausea).     diclofenac  Sodium (VOLTAREN ) 1 % GEL Research Patient: Apply 0.5 grams (1 fingertip) to each hand and each foot twice daily for up to 12 weeks 400 g 0   fluconazole  (DIFLUCAN ) 100 MG tablet Take 1 tablet (100 mg total) by mouth daily. Use as discussed with each chemo treatment 30 tablet 1   fluticasone  (FLONASE ) 50 MCG/ACT nasal spray Place 2 sprays into both nostrils daily as needed for allergies.     lidocaine -prilocaine  (EMLA ) cream Apply to affected area once 30 g 3   nystatin  (MYCOSTATIN ) 100000 UNIT/ML suspension Take 5 mLs (500,000 Units total) by mouth 4 (four) times daily. 60 mL 0   ondansetron  (ZOFRAN ) 8 MG tablet Take 1 tablet (8 mg total) by mouth every 8 (eight) hours as needed for nausea or vomiting. Start on the third day after chemotherapy. 30 tablet 1   pantoprazole   (PROTONIX ) 40 MG tablet Take 1 tablet (40 mg total) by mouth 2 (two) times daily. Protonix  40mg  PO BID for 8 weeks then reduce to 40 mg daily 180 tablet 3   prochlorperazine  (COMPAZINE ) 10 MG tablet Take 1 tablet (10 mg total) by mouth every 6 (six) hours as needed for nausea or vomiting. 30 tablet 1   No current facility-administered medications for this visit.    PHYSICAL EXAMINATION: ECOG PERFORMANCE STATUS: 1 - Symptomatic but completely ambulatory  There were no vitals filed for this visit. There were no vitals filed for this visit.  Physical Exam BREAST: Bump on the sides of the scar.  (exam performed in the presence of a chaperone)  LABORATORY DATA:  I have reviewed the data as listed    Latest Ref Rng & Units 03/28/2024    7:38 AM 03/12/2024   12:45 PM 12/29/2023    1:05 PM  CMP  Glucose 70 - 99 mg/dL 94  93  891   BUN 6 - 20 mg/dL 8  13  11    Creatinine 0.44 - 1.00 mg/dL 9.44  9.45  9.41   Sodium 135 - 145 mmol/L 136  137  139  Potassium 3.5 - 5.1 mmol/L 3.9  3.8  3.5   Chloride 98 - 111 mmol/L 105  105  105   CO2 22 - 32 mmol/L 24  24  29    Calcium  8.9 - 10.3 mg/dL 9.1  9.3  9.0   Total Protein 6.5 - 8.1 g/dL 6.9  7.4  6.9   Total Bilirubin 0.0 - 1.2 mg/dL 0.5  1.0  0.6   Alkaline Phos 38 - 126 U/L 70  100  75   AST 15 - 41 U/L 13  18  14    ALT 0 - 44 U/L 6  7  8      Lab Results  Component Value Date   WBC 5.8 03/28/2024   HGB 10.6 (L) 03/28/2024   HCT 30.8 (L) 03/28/2024   MCV 96.3 03/28/2024   PLT 179 03/28/2024   NEUTROABS 4.8 03/28/2024    ASSESSMENT & PLAN:  Malignant neoplasm of lower-inner quadrant of right breast of female, estrogen receptor negative (HCC) 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%, HER2 1+   01/25/2023: PET/CT: Right breast cancer, right axilla, retropectoral and right cervical lymph nodes   Treatment plan: Neoadjuvant chemotherapy with Taxol  carbo Keytruda  weekly x 12 followed by Adriamycin  Cytoxan  Keytruda  followed by Keytruda  maintenance until 12/30/2023 07/27/2023: Bilateral mastectomies: Left mastectomy: Benign, right mastectomy: No evidence of residual cancer, 8/20 lymph nodes positive with extranodal extension in 2 lymph nodes Adjuvant radiation therapy 09/08/2023-10/24/2022 Capecitabine  (completed June 2025) with Keytruda  (completed 12/29/2023) --------------------------------------------------------------------------------------- 03/09/2024: PET/CT: 4 mm node right axilla SUV 3.7, right subpectoral activity SUV 3.4 (possible 5 mm node) right apical 7 mm nodule is new compared to 12/23/2023 with an SUV of 2.3, right upper lobe 1 cm nodule new with a SUV of 3.6, left upper lobe nodule 7 mm SUV 4.2, soft tissue fullness proximal duodenum SUV 8.7 (possible duodenitis)   EGD: Bx: Duodenitis Lung FNA: malignant cells consistent with breast primary (triple negative) [facility] molecular testing: T p53, TMB 10.3 Brain MRI: Neg   Treatment plan: Enhertu  started 03/28/24, today is cycle 2 Toxicities: Fatigue Throat redness: Thrush: Diflucan  was prescribed and it is better Neutropenia: I recommended adding Neulasta  injection on day 3 and reducing the dosage of her chemotherapy. Chemo induced anemia: Monitoring closely.  Proceed with today's treatment with ANC of 1.1. CT scans have been ordered after 3 cycles of chemo.   ------------------------------------- Assessment and Plan Assessment & Plan Malignant neoplasm of lower-inner quadrant of right breast Undergoing Enhertu  treatment with associated hematologic toxicity. Current dose may be excessive, causing hemoglobin and platelet decline. Hair loss noted, consistent with treatment side effects. Bump near surgical scar requires  evaluation. - Adjust Enhertu  dose to middle range. - Administer Neulasta  to support WBC counts. - Schedule pre-cycle scans to evaluate treatment response. - Examine bump near surgical scar.      No orders of the defined types were placed in this encounter.  The patient has a good understanding of the overall plan. she agrees with it. she will call with any problems that may develop before the next visit here. Total time spent: 30 mins including face to face time and time spent for planning, charting and co-ordination of care   Viinay K Dontrelle Mazon, MD 04/18/24

## 2024-04-19 ENCOUNTER — Encounter: Admitting: Adult Health

## 2024-04-19 ENCOUNTER — Other Ambulatory Visit: Payer: Self-pay

## 2024-04-20 ENCOUNTER — Encounter: Payer: Self-pay | Admitting: Hematology and Oncology

## 2024-04-23 ENCOUNTER — Inpatient Hospital Stay

## 2024-04-23 ENCOUNTER — Other Ambulatory Visit: Payer: Self-pay

## 2024-04-23 ENCOUNTER — Telehealth: Payer: Self-pay

## 2024-04-23 DIAGNOSIS — Z171 Estrogen receptor negative status [ER-]: Secondary | ICD-10-CM

## 2024-04-23 DIAGNOSIS — R040 Epistaxis: Secondary | ICD-10-CM

## 2024-04-23 DIAGNOSIS — Z95828 Presence of other vascular implants and grafts: Secondary | ICD-10-CM

## 2024-04-23 DIAGNOSIS — C50311 Malignant neoplasm of lower-inner quadrant of right female breast: Secondary | ICD-10-CM | POA: Diagnosis not present

## 2024-04-23 LAB — CMP (CANCER CENTER ONLY)
ALT: 25 U/L (ref 0–44)
AST: 19 U/L (ref 15–41)
Albumin: 4.1 g/dL (ref 3.5–5.0)
Alkaline Phosphatase: 125 U/L (ref 38–126)
Anion gap: 6 (ref 5–15)
BUN: 11 mg/dL (ref 6–20)
CO2: 28 mmol/L (ref 22–32)
Calcium: 9.4 mg/dL (ref 8.9–10.3)
Chloride: 104 mmol/L (ref 98–111)
Creatinine: 0.62 mg/dL (ref 0.44–1.00)
GFR, Estimated: >60 mL/min (ref >60)
Glucose, Bld: 91 mg/dL (ref 70–99)
Potassium: 3.8 mmol/L (ref 3.5–5.1)
Sodium: 138 mmol/L (ref 135–145)
Total Bilirubin: 0.7 mg/dL (ref 0.0–1.2)
Total Protein: 6.9 g/dL (ref 6.5–8.1)

## 2024-04-23 LAB — CBC WITH DIFFERENTIAL (CANCER CENTER ONLY)
Abs Immature Granulocytes: 0.25 K/uL — ABNORMAL HIGH (ref 0.00–0.07)
Basophils Absolute: 0 K/uL (ref 0.0–0.1)
Basophils Relative: 0 %
Eosinophils Absolute: 0.4 K/uL (ref 0.0–0.5)
Eosinophils Relative: 3 %
HCT: 30.1 % — ABNORMAL LOW (ref 36.0–46.0)
Hemoglobin: 10.2 g/dL — ABNORMAL LOW (ref 12.0–15.0)
Immature Granulocytes: 2 %
Lymphocytes Relative: 5 %
Lymphs Abs: 0.8 K/uL (ref 0.7–4.0)
MCH: 31.9 pg (ref 26.0–34.0)
MCHC: 33.9 g/dL (ref 30.0–36.0)
MCV: 94.1 fL (ref 80.0–100.0)
Monocytes Absolute: 0.5 K/uL (ref 0.1–1.0)
Monocytes Relative: 4 %
Neutro Abs: 12.3 K/uL — ABNORMAL HIGH (ref 1.7–7.7)
Neutrophils Relative %: 86 %
Platelet Count: 100 K/uL — ABNORMAL LOW (ref 150–400)
RBC: 3.2 MIL/uL — ABNORMAL LOW (ref 3.87–5.11)
RDW: 14 % (ref 11.5–15.5)
Smear Review: NORMAL
WBC Count: 14.2 K/uL — ABNORMAL HIGH (ref 4.0–10.5)
nRBC: 0 % (ref 0.0–0.2)

## 2024-04-23 LAB — SAMPLE TO BLOOD BANK

## 2024-04-23 MED ORDER — SODIUM CHLORIDE 0.9% FLUSH
10.0000 mL | Freq: Once | INTRAVENOUS | Status: AC
  Administered 2024-04-23: 10 mL

## 2024-04-23 NOTE — Telephone Encounter (Signed)
 Pt does not need platelets. She was educated on managing epistaxis and knows to call with any further concerns.

## 2024-04-23 NOTE — Progress Notes (Signed)
 Per Odean MD, pt does not need PLT transfusion today. Pt advised to call back with any changes.

## 2024-04-23 NOTE — Telephone Encounter (Signed)
 S/w pt regarding MyChart message about epistaxis. She reports she is not actively bleeding at this time; however, when she wipes her nose there is still pink. Per MD, pt needs to come in for labs to check platelet count and tentatively schedule platelet transfusion if needed. Orders placed for CBC and sample to BB. Pt is agreeable to this plan and is scheduled for 11 AM labs. She knows to wait in the lobby for results.

## 2024-04-30 ENCOUNTER — Encounter (INDEPENDENT_AMBULATORY_CARE_PROVIDER_SITE_OTHER): Payer: Self-pay

## 2024-04-30 ENCOUNTER — Encounter: Payer: Self-pay | Admitting: General Practice

## 2024-04-30 NOTE — Progress Notes (Signed)
 [facility] Spiritual Care Note  Ayeza values follow-up phone calls. Left voicemail with direct number, encouraging return call, and will try again later in week if needed.  [address] [name], [place], [facility] Pager [phone] Voicemail [phone]

## 2024-05-01 ENCOUNTER — Encounter: Admitting: Gastroenterology

## 2024-05-02 ENCOUNTER — Encounter: Payer: Self-pay | Admitting: General Practice

## 2024-05-02 ENCOUNTER — Other Ambulatory Visit: Payer: Self-pay

## 2024-05-02 NOTE — Progress Notes (Signed)
 [facility] Spiritual Care Note  Left follow-up check-in voicemail with encouragement to return call.  [address] [name], [place], [facility] Pager [phone] Voicemail [phone]

## 2024-05-06 ENCOUNTER — Encounter: Payer: Self-pay | Admitting: Hematology and Oncology

## 2024-05-09 ENCOUNTER — Other Ambulatory Visit

## 2024-05-09 ENCOUNTER — Ambulatory Visit: Admitting: Hematology and Oncology

## 2024-05-09 ENCOUNTER — Ambulatory Visit: Admitting: Physician Assistant

## 2024-05-09 ENCOUNTER — Ambulatory Visit

## 2024-05-11 ENCOUNTER — Inpatient Hospital Stay

## 2024-05-11 ENCOUNTER — Inpatient Hospital Stay (HOSPITAL_BASED_OUTPATIENT_CLINIC_OR_DEPARTMENT_OTHER): Admitting: Adult Health

## 2024-05-11 ENCOUNTER — Encounter: Payer: Self-pay | Admitting: Adult Health

## 2024-05-11 ENCOUNTER — Inpatient Hospital Stay: Attending: Hematology and Oncology

## 2024-05-11 VITALS — BP 122/75 | HR 74 | Temp 98.6°F | Resp 17 | Wt 144.0 lb

## 2024-05-11 DIAGNOSIS — Z923 Personal history of irradiation: Secondary | ICD-10-CM | POA: Insufficient documentation

## 2024-05-11 DIAGNOSIS — R002 Palpitations: Secondary | ICD-10-CM | POA: Diagnosis not present

## 2024-05-11 DIAGNOSIS — Z79899 Other long term (current) drug therapy: Secondary | ICD-10-CM | POA: Diagnosis not present

## 2024-05-11 DIAGNOSIS — Z95828 Presence of other vascular implants and grafts: Secondary | ICD-10-CM

## 2024-05-11 DIAGNOSIS — D72819 Decreased white blood cell count, unspecified: Secondary | ICD-10-CM | POA: Diagnosis not present

## 2024-05-11 DIAGNOSIS — Z5112 Encounter for antineoplastic immunotherapy: Secondary | ICD-10-CM | POA: Insufficient documentation

## 2024-05-11 DIAGNOSIS — Z171 Estrogen receptor negative status [ER-]: Secondary | ICD-10-CM

## 2024-05-11 DIAGNOSIS — C50311 Malignant neoplasm of lower-inner quadrant of right female breast: Secondary | ICD-10-CM | POA: Insufficient documentation

## 2024-05-11 DIAGNOSIS — C7800 Secondary malignant neoplasm of unspecified lung: Secondary | ICD-10-CM | POA: Insufficient documentation

## 2024-05-11 DIAGNOSIS — Z9013 Acquired absence of bilateral breasts and nipples: Secondary | ICD-10-CM | POA: Insufficient documentation

## 2024-05-11 LAB — CMP (CANCER CENTER ONLY)
ALT: 10 U/L (ref 0–44)
AST: 19 U/L (ref 15–41)
Albumin: 4.2 g/dL (ref 3.5–5.0)
Alkaline Phosphatase: 89 U/L (ref 38–126)
Anion gap: 5 (ref 5–15)
BUN: 8 mg/dL (ref 6–20)
CO2: 27 mmol/L (ref 22–32)
Calcium: 9.2 mg/dL (ref 8.9–10.3)
Chloride: 106 mmol/L (ref 98–111)
Creatinine: 0.58 mg/dL (ref 0.44–1.00)
GFR, Estimated: >60 mL/min (ref >60)
Glucose, Bld: 96 mg/dL (ref 70–99)
Potassium: 3.6 mmol/L (ref 3.5–5.1)
Sodium: 138 mmol/L (ref 135–145)
Total Bilirubin: 0.5 mg/dL (ref 0.0–1.2)
Total Protein: 6.8 g/dL (ref 6.5–8.1)

## 2024-05-11 LAB — CBC WITH DIFFERENTIAL (CANCER CENTER ONLY)
Abs Immature Granulocytes: 0.01 K/uL (ref 0.00–0.07)
Basophils Absolute: 0 K/uL (ref 0.0–0.1)
Basophils Relative: 0 %
Eosinophils Absolute: 0.8 K/uL — ABNORMAL HIGH (ref 0.0–0.5)
Eosinophils Relative: 22 %
HCT: 33.3 % — ABNORMAL LOW (ref 36.0–46.0)
Hemoglobin: 11.5 g/dL — ABNORMAL LOW (ref 12.0–15.0)
Immature Granulocytes: 0 %
Lymphocytes Relative: 16 %
Lymphs Abs: 0.6 K/uL — ABNORMAL LOW (ref 0.7–4.0)
MCH: 31.9 pg (ref 26.0–34.0)
MCHC: 34.5 g/dL (ref 30.0–36.0)
MCV: 92.5 fL (ref 80.0–100.0)
Monocytes Absolute: 0.4 K/uL (ref 0.1–1.0)
Monocytes Relative: 10 %
Neutro Abs: 2 K/uL (ref 1.7–7.7)
Neutrophils Relative %: 52 %
Platelet Count: 198 K/uL (ref 150–400)
RBC: 3.6 MIL/uL — ABNORMAL LOW (ref 3.87–5.11)
RDW: 14.8 % (ref 11.5–15.5)
WBC Count: 3.8 K/uL — ABNORMAL LOW (ref 4.0–10.5)
nRBC: 0 % (ref 0.0–0.2)

## 2024-05-11 MED ORDER — SODIUM CHLORIDE 0.9% FLUSH: 10.0000 mL | INTRAVENOUS | Status: DC | PRN

## 2024-05-11 MED ORDER — SODIUM CHLORIDE 0.9% FLUSH
10.0000 mL | Freq: Once | INTRAVENOUS | Status: AC
Start: 2024-05-11 — End: 2024-05-11
  Administered 2024-05-11: 10 mL

## 2024-05-11 MED ORDER — DEXAMETHASONE SODIUM PHOSPHATE 10 MG/ML IJ SOLN
10.0000 mg | Freq: Once | INTRAMUSCULAR | Status: AC
  Administered 2024-05-11: 10 mg via INTRAVENOUS
  Filled 2024-05-11: qty 1

## 2024-05-11 MED ORDER — ACETAMINOPHEN 325 MG PO TABS
650.0000 mg | ORAL_TABLET | Freq: Once | ORAL | Status: AC
  Administered 2024-05-11: 650 mg via ORAL
  Filled 2024-05-11: qty 2

## 2024-05-11 MED ORDER — APREPITANT 130 MG/18ML IV EMUL
130.0000 mg | Freq: Once | INTRAVENOUS | Status: AC
  Administered 2024-05-11: 130 mg via INTRAVENOUS
  Filled 2024-05-11: qty 18

## 2024-05-11 MED ORDER — PEGFILGRASTIM 6 MG/0.6ML ~~LOC~~ PSKT
6.0000 mg | PREFILLED_SYRINGE | Freq: Once | SUBCUTANEOUS | Status: AC
  Administered 2024-05-11: 6 mg via SUBCUTANEOUS
  Filled 2024-05-11: qty 0.6

## 2024-05-11 MED ORDER — DIPHENHYDRAMINE HCL 25 MG PO CAPS
25.0000 mg | ORAL_CAPSULE | Freq: Once | ORAL | Status: AC
  Administered 2024-05-11: 25 mg via ORAL
  Filled 2024-05-11: qty 1

## 2024-05-11 MED ORDER — PALONOSETRON HCL INJECTION 0.25 MG/5ML
0.2500 mg | Freq: Once | INTRAVENOUS | Status: AC
  Administered 2024-05-11: 0.25 mg via INTRAVENOUS
  Filled 2024-05-11: qty 5

## 2024-05-11 MED ORDER — DEXTROSE 5 % IV SOLN
INTRAVENOUS | Status: DC
Start: 2024-05-11 — End: 2024-05-11

## 2024-05-11 MED ORDER — FAM-TRASTUZUMAB DERUXTECAN-NXKI CHEMO 100 MG IV SOLR
4.4000 mg/kg | Freq: Once | INTRAVENOUS | Status: AC
  Administered 2024-05-11: 300 mg via INTRAVENOUS
  Filled 2024-05-11: qty 15

## 2024-05-11 NOTE — Patient Instructions (Addendum)
 [facility] - A DEPT OF [facility]  Discharge Instructions: Thank you for choosing [facility] to provide your oncology and hematology care.   If you have a lab appointment with the Cancer Center, please go directly to the Cancer Center and check in at the registration area.   Wear comfortable clothing and clothing appropriate for easy access to any Portacath or PICC line.   We strive to give you quality time with your provider. You may need to reschedule your appointment if you arrive late (15 or more minutes).  Arriving late affects you and other patients whose appointments are after yours.  Also, if you miss three or more appointments without notifying the office, you may be dismissed from the clinic at the provider's discretion.      For prescription refill requests, have your pharmacy contact our office and allow 72 hours for refills to be completed.    Today you received the following chemotherapy and/or immunotherapy agent: Fam-Trastuzumab  (Enhertu )   To help prevent nausea and vomiting after your treatment, we encourage you to take your nausea medication as directed.  BELOW ARE SYMPTOMS THAT SHOULD BE REPORTED IMMEDIATELY: *FEVER GREATER THAN 100.4 F (38 C) OR HIGHER *CHILLS OR SWEATING *NAUSEA AND VOMITING THAT IS NOT CONTROLLED WITH YOUR NAUSEA MEDICATION *UNUSUAL SHORTNESS OF BREATH *UNUSUAL BRUISING OR BLEEDING *URINARY PROBLEMS (pain or burning when urinating, or frequent urination) *BOWEL PROBLEMS (unusual diarrhea, constipation, pain near the anus) TENDERNESS IN MOUTH AND THROAT WITH OR WITHOUT PRESENCE OF ULCERS (sore throat, sores in mouth, or a toothache) UNUSUAL RASH, SWELLING OR PAIN  UNUSUAL VAGINAL DISCHARGE OR ITCHING   Items with * indicate a potential emergency and should be followed up as soon as possible or go to the Emergency Department if any problems should occur.  Please show the CHEMOTHERAPY ALERT CARD or  IMMUNOTHERAPY ALERT CARD at check-in to the Emergency Department and triage nurse.  Should you have questions after your visit or need to cancel or reschedule your appointment, please contact [facility] - A DEPT OF [facility]  Dept: [phone]  and follow the prompts.  Office hours are 8:00 a.m. to 4:30 p.m. Monday - Friday. Please note that voicemails left after 4:00 p.m. may not be returned until the following business day.  We are closed weekends and major holidays. You have access to a nurse at all times for urgent questions. Please call the main number to the clinic Dept: [phone] and follow the prompts.   For any non-urgent questions, you may also contact your provider using MyChart. We now offer e-Visits for anyone 60 and older to request care online for non-urgent symptoms. For details visit [email or web address].   Also download the MyChart app! Go to the app store, search MyChart, open the app, select [facility], and log in with your MyChart username and password.  Pegfilgrastim  Injection What is this medication? PEGFILGRASTIM  (PEG fil gra stim) lowers the risk of infection in people who are receiving chemotherapy. It works by Systems analyst make more white blood cells, which protects your body from infection. It may also be used to help people who have been exposed to high doses of radiation. This medicine may be used for other purposes; ask your health care provider or pharmacist if you have questions. COMMON BRAND NAME(S): Fulphila , Fylnetra , Neulasta , Nyvepria , Stimufend , UDENYCA , UDENYCA  ONBODY, Ziextenzo  What should I tell my care team  before I take this medication? They need to know if you have any of these conditions: Kidney disease Latex allergy Ongoing radiation therapy Sickle cell disease Skin reactions to acrylic adhesives (On-Body Injector only) An unusual or allergic reaction to pegfilgrastim , filgrastim , other medications,  foods, dyes, or preservatives Pregnant or trying to get pregnant Breast-feeding How should I use this medication? This medication is for injection under the skin. If you get this medication at home, you will be taught how to prepare and give the pre-filled syringe or how to use the On-body Injector. Refer to the patient Instructions for Use for detailed instructions. Use exactly as directed. Tell your care team immediately if you suspect that the On-body Injector may not have performed as intended or if you suspect the use of the On-body Injector resulted in a missed or partial dose. It is important that you put your used needles and syringes in a special sharps container. Do not put them in a trash can. If you do not have a sharps container, call your pharmacist or care team to get one. Talk to your care team about the use of this medication in children. While this medication may be prescribed for selected conditions, precautions do apply. Overdosage: If you think you have taken too much of this medicine contact a poison control center or emergency room at once. NOTE: This medicine is only for you. Do not share this medicine with others. What if I miss a dose? It is important not to miss your dose. Call your care team if you miss your dose. If you miss a dose due to an On-body Injector failure or leakage, a new dose should be administered as soon as possible using a single prefilled syringe for manual use. What may interact with this medication? Interactions have not been studied. This list may not describe all possible interactions. Give your health care provider a list of all the medicines, herbs, non-prescription drugs, or dietary supplements you use. Also tell them if you smoke, drink alcohol, or use illegal drugs. Some items may interact with your medicine. What should I watch for while using this medication? Your condition will be monitored carefully while you are receiving this  medication. You may need blood work done while you are taking this medication. Talk to your care team about your risk of cancer. You may be more at risk for certain types of cancer if you take this medication. If you are going to need a MRI, CT scan, or other procedure, tell your care team that you are using this medication (On-Body Injector only). What side effects may I notice from receiving this medication? Side effects that you should report to your care team as soon as possible: Allergic reactions--skin rash, itching, hives, swelling of the face, lips, tongue, or throat Capillary leak syndrome--stomach or muscle pain, unusual weakness or fatigue, feeling faint or lightheaded, decrease in the amount of urine, swelling of the ankles, hands, or feet, trouble breathing High white blood cell level--fever, fatigue, trouble breathing, night sweats, change in vision, weight loss Inflammation of the aorta--fever, fatigue, back, chest, or stomach pain, severe headache Kidney injury (glomerulonephritis)--decrease in the amount of urine, red or dark brown urine, foamy or bubbly urine, swelling of the ankles, hands, or feet Shortness of breath or trouble breathing Spleen injury--pain in upper left stomach or shoulder Unusual bruising or bleeding Side effects that usually do not require medical attention (report to your care team if they continue or are bothersome): Bone  pain Pain in the hands or feet This list may not describe all possible side effects. Call your doctor for medical advice about side effects. You may report side effects to FDA at 1-800-FDA-1088. Where should I keep my medication? Keep out of the reach of children. If you are using this medication at home, you will be instructed on how to store it. Throw away any unused medication after the expiration date on the label. NOTE: This sheet is a summary. It may not cover all possible information. If you have questions about this medicine,  talk to your doctor, pharmacist, or health care provider.  2024 Elsevier/Gold Standard (2021-08-21 00:00:00)

## 2024-05-11 NOTE — Progress Notes (Deleted)
 SURVIVORSHIP VISIT:  BRIEF ONCOLOGIC HISTORY:  Oncology History  Malignant neoplasm of lower-inner quadrant of right breast of female, estrogen receptor negative (HCC)  01/10/2023 Initial Diagnosis   Palpable lump right axilla x 2 weeks bulky axillary lymphadenopathy ultrasound breast: 2 masses at 4 o'clock position 1.1 cm and 0.6 cm multiple axillary lymph nodes largest 4.5 cm also level 2 and 3 (subpectoral lymph node as well): Biopsy: Grade 3 IDC ER 0%, PR 0%, Ki-67 95%, HER2 1+   01/26/2023 Genetic Testing   Negative [employer] Custom Panel (37 genes).  Report date is 01/26/2023.    The [employer] Custom Cancers + RNA Panel includes sequencing, deletion/duplication, and RNA analysis of the following 37 genes: APC, ATM, AXIN2, BARD1, BMPR1A, BRCA1, BRCA2, BRIP1, CDH1, CDK4*, CDKN2A (p14ARF)*, CDKN2A (p16INK4a), CHEK2, CTNNA1, DICER1, EPCAM*, FH, GREM1*, HOXB13*, MBD4*, MLH1, MSH2, MSH3, MSH6, MUTYH, NF1, NTHL1, PALB2, PMS2, POLD1, POLE, PTEN, RAD51C, RAD51D, SMAD4, SMARCA4, STK11, TP53.  *Genes without RNA analysis.    01/28/2023 - 06/27/2023 Chemotherapy   Patient is on Treatment Plan : BREAST Pembrolizumab  (200) D1 + Carboplatin  (1.5) D1,8,15 + Paclitaxel  (80) D1,8,15 q21d X 4 cycles / Pembrolizumab  (200) D1 + AC D1 q21d x 4 cycles     07/15/2023 - 12/29/2023 Chemotherapy   Patient is on Treatment Plan : BREAST Pembrolizumab  (200) q21d x 27 weeks     09/07/2023 - 10/26/2023 Radiation Therapy   First Treatment Date: 2023-09-07 Last Treatment Date: 2023-10-26   Plan Name: CW_R_BO Site: Chest Wall, Right Technique: 3D Mode: Photon Dose Per Fraction: 1.8 Gy Prescribed Dose (Delivered / Prescribed): 25.2 Gy / 25.2 Gy Prescribed Fxs (Delivered / Prescribed): 14 / 14   Plan Name: CW_R_Neck_Ax Site: Chest Wall, Right Technique: 3D Mode: Photon Dose Per Fraction: 1.8 Gy Prescribed Dose (Delivered / Prescribed): 50.4 Gy / 50.4 Gy Prescribed Fxs (Delivered / Prescribed): 28 / 28   Plan Name:  CW_R_Bst Site: Chest Wall, Right Technique: 3D Mode: Photon Dose Per Fraction: 1.8 Gy Prescribed Dose (Delivered / Prescribed): 9 Gy / 9 Gy Prescribed Fxs (Delivered / Prescribed): 5 / 5   Plan Name: CW_R Site: Chest Wall, Right Technique: 3D Mode: Photon Dose Per Fraction: 1.8 Gy Prescribed Dose (Delivered / Prescribed): 25.2 Gy / 25.2 Gy Prescribed Fxs (Delivered / Prescribed): 14 / 14     03/28/2024 -  Chemotherapy   Patient is on Treatment Plan : BREAST Fam-Trastuzumab Deruxtecan-nxki  (Enhertu ) (5.4) q21d       INTERVAL HISTORY:  Suzanne Tucker to review her survivorship care plan detailing her treatment course for breast cancer, as well as monitoring long-term side effects of that treatment, education regarding health maintenance, screening, and overall wellness and health promotion.     Overall, Suzanne Tucker reports feeling quite well   REVIEW OF SYSTEMS:  Review of Systems - Oncology Breast: Denies any new nodularity, masses, tenderness, nipple changes, or nipple discharge.       PAST MEDICAL/SURGICAL HISTORY:  Past Medical History:  Diagnosis Date  . Anginal pain (HCC) 02/2019   related to MVP  . Back pain   . Breast cancer (HCC)   . Depression   . Dysrhythmia    MVP  . Mitral prolapse   . PONV (postoperative nausea and vomiting)   . Sleep apnea    mild no cpap indicated   Past Surgical History:  Procedure Laterality Date  . BREAST SURGERY Right 2024   Breast biopsy x 2  . BRONCHIAL BIOPSY  03/19/2024   Procedure: BRONCHOSCOPY,  WITH BIOPSY;  Surgeon: Shelah Lamar RAMAN, MD;  Location: [facility] ENDOSCOPY;  Service: Pulmonary;;  . BRONCHIAL BRUSHINGS  03/19/2024   Procedure: BRONCHOSCOPY, WITH BRUSH BIOPSY;  Surgeon: Shelah Lamar RAMAN, MD;  Location: [facility] ENDOSCOPY;  Service: Pulmonary;;  . BRONCHIAL NEEDLE ASPIRATION BIOPSY  03/19/2024   Procedure: BRONCHOSCOPY, WITH NEEDLE ASPIRATION BIOPSY;  Surgeon: Shelah Lamar RAMAN, MD;  Location: [facility] ENDOSCOPY;  Service: Pulmonary;;  .  BRONCHOSCOPY, WITH BIOPSY USING ELECTROMAGNETIC NAVIGATION Bilateral 03/19/2024   Procedure: BRONCHOSCOPY, WITH BIOPSY USING ELECTROMAGNETIC NAVIGATION;  Surgeon: Shelah Lamar RAMAN, MD;  Location: [facility] ENDOSCOPY;  Service: Pulmonary;  Laterality: Bilateral;  WITH FLURO  . CESAREAN SECTION  03/30/2008  . MASTECTOMY WITH AXILLARY LYMPH NODE DISSECTION Bilateral 07/27/2023   Procedure: BILATERAL MASTECTOMY WITH RIGHT AXILLARY LYMPH NODE DISSECTION;  Surgeon: Aron Shoulders, MD;  Location: [facility];  Service: General;  Laterality: Bilateral;  PEC BLOCK  . PORT-A-CATH REMOVAL Left 01/31/2024   Procedure: REMOVAL PORT-A-CATH;  Surgeon: Aron Shoulders, MD;  Location: [facility];  Service: General;  Laterality: Left;  . PORTACATH PLACEMENT N/A 01/27/2023   Procedure: INSERTION PORT-A-CATH;  Surgeon: Aron Shoulders, MD;  Location: [facility];  Service: General;  Laterality: N/A;  leaving accessed  . PORTACATH PLACEMENT Left 03/27/2024   Procedure: INSERTION, TUNNELED CENTRAL VENOUS DEVICE, WITH PORT;  Surgeon: Aron Shoulders, MD;  Location: [facility];  Service: General;  Laterality: Left;  . ROBOTIC ASSISTED TOTAL HYSTERECTOMY Bilateral 03/15/2019   Procedure: XI ROBOTIC ASSISTED TOTAL HYSTERECTOMY with Bilateral Salpingectomy;  Surgeon: Darcel Pool, MD;  Location: [facility];  Service: Gynecology;  Laterality: Bilateral;  . TUBAL LIGATION  2017     ALLERGIES:  Allergies  Allergen Reactions  . Antihistamines, Loratadine-Type Other (See Comments)    Patient reports numbness of the tongue  . Kiwi Extract Swelling and Other (See Comments)    Mouth tingles, too  . Morphine Anaphylaxis and Shortness Of Breath  . Oxycontin  [Oxycodone ] Other (See Comments)    RPh has recommended this not be taken  (Oxycontin )  . Tape Other (See Comments)    NEEDS DRESSINGS FOR SENSITIVE SKIN!!!  . Scharlene [Benzonatate] Hives, Itching and Other (See Comments)    Hives,  itching, burning all over.   . Tramadol  Swelling and Other (See Comments)    Tingling in the mouth with swelling   . Codeine Nausea And Vomiting  . Hydrocodone Hives  . Latex Rash     CURRENT MEDICATIONS:  Outpatient Encounter Medications as of 05/11/2024  Medication Sig Note  . acetaminophen  (TYLENOL ) 500 MG tablet Take 1,000 mg by mouth every 6 (six) hours as needed for fever or mild pain (pain score 1-3).   . ALPRAZolam  (XANAX ) 0.5 MG tablet Take 1 tablet (0.5 mg total) by mouth 3 (three) times daily as needed for anxiety. 03/14/2024: Has not started  . calcium  carbonate (TUMS - DOSED IN MG ELEMENTAL CALCIUM ) 500 MG chewable tablet Chew 1,000 mg by mouth as needed for indigestion or heartburn.   . ciprofloxacin  (CIPRO ) 500 MG tablet Take 1 tablet (500 mg total) by mouth 2 (two) times daily.   . Dextrose -Fructose-Sod Citrate (NAUZENE) 968-175-230 MG CHEW Chew 1 tablet by mouth daily as needed (Nausea).   . diclofenac  Sodium (VOLTAREN ) 1 % GEL Research Patient: Apply 0.5 grams (1 fingertip) to each hand and each foot twice daily for up to 12 weeks   . fluconazole  (DIFLUCAN ) 100 MG tablet Take 1 tablet (100 mg total)  by mouth daily. Use as discussed with each chemo treatment   . fluticasone  (FLONASE ) 50 MCG/ACT nasal spray Place 2 sprays into both nostrils daily as needed for allergies.   . lidocaine -prilocaine  (EMLA ) cream Apply to affected area once   . Multiple Vitamins-Minerals (WOMENS MULTI VITAMIN & MINERAL PO)    . nystatin  (MYCOSTATIN ) 100000 UNIT/ML suspension Take 5 mLs (500,000 Units total) by mouth 4 (four) times daily.   . ondansetron  (ZOFRAN ) 8 MG tablet Take 1 tablet (8 mg total) by mouth every 8 (eight) hours as needed for nausea or vomiting. Start on the third day after chemotherapy.   . pantoprazole  (PROTONIX ) 40 MG tablet Take 1 tablet (40 mg total) by mouth 2 (two) times daily. Protonix  40mg  PO BID for 8 weeks then reduce to 40 mg daily   . prochlorperazine  (COMPAZINE ) 10  MG tablet Take 1 tablet (10 mg total) by mouth every 6 (six) hours as needed for nausea or vomiting.   . dexamethasone  (DECADRON ) 4 MG tablet Take 2 tablets (8 mg) by mouth daily for 3 days starting the day after chemotherapy. Take with food. (Patient not taking: Reported on 05/11/2024)    No facility-administered encounter medications on file as of 05/11/2024.     ONCOLOGIC FAMILY HISTORY:  Family History  Problem Relation Age of Onset  . Stroke Mother   . Alzheimer's disease Father   . Diabetes Maternal Grandmother   . Heart disease Maternal Grandmother   . Heart attack Maternal Grandmother   . Diabetes Maternal Grandfather   . Heart disease Maternal Grandfather   . Alzheimer's disease Paternal Grandmother   . Dementia Paternal Grandmother   . Alzheimer's disease Paternal Grandfather   . Dementia Paternal Grandfather   . Alcohol abuse Paternal Grandfather      SOCIAL HISTORY:  Social History   Socioeconomic History  . Marital status: Married    Spouse name: Not on file  . Number of children: Not on file  . Years of education: Not on file  . Highest education level: Master's degree (e.g., MA, MS, MEng, MEd, MSW, MBA)  Occupational History  . Not on file  Tobacco Use  . Smoking status: Never  . Smokeless tobacco: Never  Vaping Use  . Vaping status: Never Used  Substance and Sexual Activity  . Alcohol use: Not Currently    Comment:    . Drug use: No  . Sexual activity: Yes    Birth control/protection: Surgical  Other Topics Concern  . Not on file  Social History Narrative  . Not on file   Social Drivers of Health   Financial Resource Strain: Low Risk  (12/27/2022)   Overall Financial Resource Strain (CARDIA)   . Difficulty of Paying Living Expenses: Not very hard  Food Insecurity: No Food Insecurity (08/24/2023)   Hunger Vital Sign   . Worried About Programme researcher, broadcasting/film/video in the Last Year: Never true   . Ran Out of Food in the Last Year: Never true  Transportation  Needs: No Transportation Needs (08/24/2023)   PRAPARE - Transportation   . Lack of Transportation (Medical): No   . Lack of Transportation (Non-Medical): No  Physical Activity: Insufficiently Active (12/27/2022)   Exercise Vital Sign   . Days of Exercise per Week: 4 days   . Minutes of Exercise per Session: 20 min  Stress: No Stress Concern Present (12/27/2022)   [employer] of Occupational Health - Occupational Stress Questionnaire   . Feeling of Stress : Only  a little  Social Connections: Moderately Isolated (12/27/2022)   Social Connection and Isolation Panel   . Frequency of Communication with Friends and Family: More than three times a week   . Frequency of Social Gatherings with Friends and Family: Once a week   . Attends Religious Services: Never   . Active Member of Clubs or Organizations: No   . Attends Banker Meetings: Not on file   . Marital Status: Married  Catering manager Violence: Not At Risk (08/24/2023)   Humiliation, Afraid, Rape, and Kick questionnaire   . Fear of Current or Ex-Partner: No   . Emotionally Abused: No   . Physically Abused: No   . Sexually Abused: No     OBSERVATIONS/OBJECTIVE:  BP 122/75 (BP Location: Left Arm)   Pulse 74   Temp 98.6 F (37 C) (Temporal)   Resp 17   Wt 144 lb (65.3 kg)   SpO2 100%   BMI 27.21 kg/m  GENERAL: Patient is a well appearing female in no acute distress HEENT:  Sclerae anicteric.  Oropharynx clear and moist. No ulcerations or evidence of oropharyngeal candidiasis. Neck is supple.  NODES:  No cervical, supraclavicular, or axillary lymphadenopathy palpated.  BREAST EXAM:  Deferred. LUNGS:  Clear to auscultation bilaterally.  No wheezes or rhonchi. HEART:  Regular rate and rhythm. No murmur appreciated. ABDOMEN:  Soft, nontender.  Positive, normoactive bowel sounds. No organomegaly palpated. MSK:  No focal spinal tenderness to palpation. Full range of motion bilaterally in the upper  extremities. EXTREMITIES:  No peripheral edema.   SKIN:  Clear with no obvious rashes or skin changes. No nail dyscrasia. NEURO:  Nonfocal. Well oriented.  Appropriate affect.   LABORATORY DATA:  None for this visit.  DIAGNOSTIC IMAGING:  None for this visit.      ASSESSMENT AND PLAN:  Suzanne Tucker is a pleasant 43 y.o. female with Stage *** right/left breast invasive ductal carcinoma, ER+/PR+/HER2-, diagnosed in ***, treated with lumpectomy, adjuvant radiation therapy, and anti-estrogen therapy with *** beginning in ***.  She presents to the Survivorship Clinic for our initial meeting and routine follow-up post-completion of treatment for breast cancer.    1. Stage *** right/left breast cancer:  Suzanne Tucker is continuing to recover from definitive treatment for breast cancer. She will follow-up with her medical oncologist, Dr.  PIERRETTE with history and physical exam per surveillance protocol.  She will continue her anti-estrogen therapy with ***. Thus far, she is tolerating the *** well, with minimal side effects. Her mammogram is due ***; orders placed today.   Today, a comprehensive survivorship care plan and treatment summary was reviewed with the patient today detailing her breast cancer diagnosis, treatment course, potential late/long-term effects of treatment, appropriate follow-up care with recommendations for the future, and patient education resources.  A copy of this summary, along with a letter will be sent to the patient's primary care provider via mail/fax/In Basket message after today's visit.    #. Problem(s) at Visit______________  #. Bone health:  Given Suzanne Tucker's age/history of breast cancer and her current treatment regimen including anti-estrogen therapy with ***, she is at risk for bone demineralization.  Her last DEXA scan was ***, which showed ***.  In the meantime, she was encouraged to increase her consumption of foods rich in calcium , as well as increase her  weight-bearing activities.  She was given education on specific activities to promote bone health.  #. Cancer screening:  Due to Suzanne Tucker's history and her  age, she should receive screening for skin cancers, colon cancer, and gynecologic cancers.  The information and recommendations are listed on the patient's comprehensive care plan/treatment summary and were reviewed in detail with the patient.    #. Health maintenance and wellness promotion: Suzanne Tucker was encouraged to consume 5-7 servings of fruits and vegetables per day. We reviewed the Nutrition Rainbow handout.  She was also encouraged to engage in moderate to vigorous exercise for 30 minutes per day most days of the week.  She was instructed to limit her alcohol consumption and continue to abstain from tobacco use/***was encouraged stop smoking.     #. Support services/counseling: It is not uncommon for this period of the patient's cancer care trajectory to be one of many emotions and stressors.   She was given information regarding our available services and encouraged to contact me with any questions or for help enrolling in any of our support group/programs.    Follow up instructions:    -Return to cancer center ***  -Mammogram due in *** -She is welcome to return back to the Survivorship Clinic at any time; no additional follow-up needed at this time.  -Consider referral back to survivorship as a long-term survivor for continued surveillance  The patient was provided an opportunity to ask questions and all were answered. The patient agreed with the plan and demonstrated an understanding of the instructions.   Total encounter time:*** minutes*in face-to-face visit time, chart review, lab review, care coordination, order entry, and documentation of the encounter time.    Morna Kendall, NP 05/11/24 9:33 AM Medical Oncology and Hematology [facility] [address] [place], [place] [address] Tel. [phone]     Fax. [phone]  *Total Encounter Time as defined by the Centers for Medicare and Medicaid Services includes, in addition to the face-to-face time of a patient visit (documented in the note above) non-face-to-face time: obtaining and reviewing outside history, ordering and reviewing medications, tests or procedures, care coordination (communications with other health care professionals or caregivers) and documentation in the medical record.

## 2024-05-12 ENCOUNTER — Other Ambulatory Visit: Payer: Self-pay

## 2024-05-13 ENCOUNTER — Encounter: Payer: Self-pay | Admitting: Hematology and Oncology

## 2024-05-13 NOTE — Assessment & Plan Note (Signed)
 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%, HER2 1+  01/25/2023: PET/CT: Right breast cancer, right axilla, retropectoral and right cervical lymph nodes   Treatment plan: Neoadjuvant chemotherapy with Taxol  carbo Keytruda  weekly x 12 followed by Adriamycin  Cytoxan  Keytruda  followed by Keytruda  maintenance until 12/30/2023 07/27/2023: Bilateral mastectomies: Left mastectomy: Benign, right mastectomy: No evidence of residual cancer, 8/20 lymph nodes positive with extranodal extension in 2 lymph nodes Adjuvant radiation therapy 09/08/2023-10/24/2022 Capecitabine  (completed June 2025) with Keytruda  (completed 12/29/2023) --------------------------------------------------------------------------------------- 03/09/2024: PET/CT: 4 mm node right axilla SUV 3.7, right subpectoral activity SUV 3.4 (possible 5 mm node) right apical 7 mm nodule is new compared to 12/23/2023 with an SUV of 2.3, right upper lobe 1 cm nodule new with a SUV of 3.6, left upper lobe nodule 7 mm SUV 4.2, soft tissue fullness proximal duodenum SUV 8.7 (possible duodenitis)   EGD: Bx: Duodenitis Lung FNA: malignant cells consistent with breast primary (triple negative) Caris molecular testing: T p53, TMB 10.3 Brain MRI: Neg   Treatment plan: Enhertu  started 03/28/24,   Assessment and Plan Assessment & Plan 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 Currently on Enhertu  therapy. Discussed chronic cancer survivorship and emphasized achieving a chronic cancer state for a longer, fulfilling life. - Continue Enhertu  therapy at current dose. - Schedule CT chest, abdomen, and pelvis for the week of August 29th. - Schedule  survivorship visit for October.  Leukopenia - Administer blood cell growth factor shot. - Re-evaluate white blood cell count in three weeks.  Palpitations Intermittent palpitations possibly related to hormonal changes or Enhertu  therapy. - Monitor heart rate and palpitations. - Continue current dose of Enhertu  and reassess at next visit.

## 2024-05-13 NOTE — Progress Notes (Signed)
 [name], MD [address] [place] [address]   DIAGNOSIS:  Cancer Staging  Malignant neoplasm of lower-inner quadrant of right breast of female, estrogen receptor negative (HCC) Staging form: Breast, AJCC 8th Edition - Clinical: Stage IIIC (cT2, cN3c, cM0, G3, ER-, PR-, HER2-) - Unsigned Histologic grading system: 3 grade system    SUMMARY OF ONCOLOGIC HISTORY: Oncology History  Malignant neoplasm of lower-inner quadrant of right breast of female, estrogen receptor negative (HCC)  01/10/2023 Initial Diagnosis   Palpable lump right axilla x 2 weeks bulky axillary lymphadenopathy ultrasound breast: 2 masses at 4 o'clock position 1.1 cm and 0.6 cm multiple axillary lymph nodes largest 4.5 cm also level 2 and 3 (subpectoral lymph node as well): Biopsy: Grade 3 IDC ER 0%, PR 0%, Ki-67 95%, HER2 1+   01/26/2023 Genetic Testing   Negative [employer] Custom Panel (37 genes).  Report date is 01/26/2023.    The [employer] Custom Cancers + RNA Panel includes sequencing, deletion/duplication, and RNA analysis of the following 37 genes: APC, ATM, AXIN2, BARD1, BMPR1A, BRCA1, BRCA2, BRIP1, CDH1, CDK4*, CDKN2A (p14ARF)*, CDKN2A (p16INK4a), CHEK2, CTNNA1, DICER1, EPCAM*, FH, GREM1*, HOXB13*, MBD4*, MLH1, MSH2, MSH3, MSH6, MUTYH, NF1, NTHL1, PALB2, PMS2, POLD1, POLE, PTEN, RAD51C, RAD51D, SMAD4, SMARCA4, STK11, TP53.  *Genes without RNA analysis.    01/28/2023 - 06/27/2023 Chemotherapy   Patient is on Treatment Plan : BREAST Pembrolizumab  (200) D1 + Carboplatin  (1.5) D1,8,15 + Paclitaxel  (80) D1,8,15 q21d X 4 cycles / Pembrolizumab  (200) D1 + AC D1 q21d x 4 cycles     07/15/2023 - 12/29/2023 Chemotherapy   Patient is on Treatment Plan : BREAST Pembrolizumab  (200) q21d x 27 weeks     09/07/2023 - 10/26/2023 Radiation Therapy   First Treatment Date: 2023-09-07 Last Treatment Date: 2023-10-26   Plan Name: CW_R_BO Site: Chest Wall,  Right Technique: 3D Mode: Photon Dose Per Fraction: 1.8 Gy Prescribed Dose (Delivered / Prescribed): 25.2 Gy / 25.2 Gy Prescribed Fxs (Delivered / Prescribed): 14 / 14   Plan Name: CW_R_Neck_Ax Site: Chest Wall, Right Technique: 3D Mode: Photon Dose Per Fraction: 1.8 Gy Prescribed Dose (Delivered / Prescribed): 50.4 Gy / 50.4 Gy Prescribed Fxs (Delivered / Prescribed): 28 / 28   Plan Name: CW_R_Bst Site: Chest Wall, Right Technique: 3D Mode: Photon Dose Per Fraction: 1.8 Gy Prescribed Dose (Delivered / Prescribed): 9 Gy / 9 Gy Prescribed Fxs (Delivered / Prescribed): 5 / 5   Plan Name: CW_R Site: Chest Wall, Right Technique: 3D Mode: Photon Dose Per Fraction: 1.8 Gy Prescribed Dose (Delivered / Prescribed): 25.2 Gy / 25.2 Gy Prescribed Fxs (Delivered / Prescribed): 14 / 14     03/28/2024 -  Chemotherapy   Patient is on Treatment Plan : BREAST Fam-Trastuzumab Deruxtecan-nxki  (Enhertu ) (5.4) q21d       CURRENT THERAPY: Enhertu   INTERVAL HISTORY:  Discussed the use of AI scribe software for clinical note transcription with the patient, who gave verbal consent to proceed.  History of Present Illness Suzanne Tucker is a 43 year old female with metastatic breast cancer who presents for follow-up on treatment with Enhertu .  She is undergoing treatment with Enhertu  for metastatic breast cancer. A scan in June 2025 showed new lung nodules, with fine needle aspiration confirming metastatic disease. A CT scan of the chest, abdomen, and pelvis is scheduled for the end of August 2025.  She experiences palpitations, more pronounced during her menstrual cycle, described as skipped heartbeats.  There is no chest pain or shortness of breath.  Her labs are stable, but kidney and liver function tests and electrolytes are pending. 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  MEDICAL HISTORY: Past Medical History:  Diagnosis Date   Anginal pain (HCC) 02/2019   related to MVP   Back pain    Breast cancer (HCC)    Depression    Dysrhythmia    MVP   Mitral prolapse    PONV (postoperative nausea and vomiting)    Sleep apnea    mild no cpap indicated    SURGICAL HISTORY: Past Surgical History:  Procedure Laterality Date   BREAST SURGERY Right 2024   Breast biopsy x 2   BRONCHIAL BIOPSY  03/19/2024   Procedure: BRONCHOSCOPY, WITH BIOPSY;  Surgeon: Shelah Lamar RAMAN, MD;  Location: [facility];  Service: Pulmonary;;   BRONCHIAL BRUSHINGS  03/19/2024   Procedure: BRONCHOSCOPY, WITH BRUSH BIOPSY;  Surgeon: Shelah Lamar RAMAN, MD;  Location: [facility];  Service: Pulmonary;;   BRONCHIAL NEEDLE ASPIRATION BIOPSY  03/19/2024   Procedure: BRONCHOSCOPY, WITH NEEDLE ASPIRATION BIOPSY;  Surgeon: Shelah Lamar RAMAN, MD;  Location: [facility];  Service: Pulmonary;;   BRONCHOSCOPY, WITH BIOPSY USING ELECTROMAGNETIC NAVIGATION Bilateral 03/19/2024   Procedure: BRONCHOSCOPY, WITH BIOPSY USING ELECTROMAGNETIC NAVIGATION;   Surgeon: Shelah Lamar RAMAN, MD;  Location: [facility];  Service: Pulmonary;  Laterality: Bilateral;  WITH FLURO   CESAREAN SECTION  03/30/2008   MASTECTOMY WITH AXILLARY LYMPH NODE DISSECTION Bilateral 07/27/2023   Procedure: BILATERAL MASTECTOMY WITH RIGHT AXILLARY LYMPH NODE DISSECTION;  Surgeon: Aron Shoulders, MD;  Location: [facility];  Service: General;  Laterality: Bilateral;  PEC BLOCK   PORT-A-CATH REMOVAL Left 01/31/2024   Procedure: REMOVAL PORT-A-CATH;  Surgeon: Aron Shoulders, MD;  Location: [facility];  Service: General;  Laterality: Left;   PORTACATH PLACEMENT N/A 01/27/2023   Procedure: INSERTION PORT-A-CATH;  Surgeon: Aron Shoulders, MD;  Location: [facility];  Service: General;  Laterality: N/A;  leaving accessed   PORTACATH PLACEMENT Left 03/27/2024   Procedure: INSERTION, TUNNELED CENTRAL VENOUS DEVICE, WITH PORT;  Surgeon: Aron Shoulders, MD;  Location: [facility];  Service: General;  Laterality: Left;   ROBOTIC ASSISTED TOTAL HYSTERECTOMY Bilateral 03/15/2019   Procedure: XI ROBOTIC ASSISTED TOTAL HYSTERECTOMY with Bilateral Salpingectomy;  Surgeon: Darcel Pool, MD;  Location: [facility];  Service: Gynecology;  Laterality: Bilateral;   TUBAL LIGATION  2017    SOCIAL HISTORY: Social History   Socioeconomic History   Marital status: Married    Spouse name: Not on file   Number of children: Not  on file   Years of education: Not on file   Highest education level: Master's degree (e.g., MA, MS, MEng, MEd, MSW, MBA)  Occupational History   Not on file  Tobacco Use   Smoking status: Never   Smokeless tobacco: Never  Vaping Use   Vaping status: Never Used  Substance and Sexual Activity   Alcohol use: Not Currently    Comment:     Drug use: No   Sexual activity: Yes    Birth control/protection: Surgical  Other Topics Concern   Not on file  Social History Narrative   Not on file   Social Drivers of Health    Financial Resource Strain: Low Risk  (12/27/2022)   Overall Financial Resource Strain (CARDIA)    Difficulty of Paying Living Expenses: Not very hard  Food Insecurity: No Food Insecurity (08/24/2023)   Hunger Vital Sign    Worried About Running Out of Food in the Last Year: Never true    Ran Out of Food in the Last Year: Never true  Transportation Needs: No Transportation Needs (08/24/2023)   PRAPARE - Administrator, Civil Service (Medical): No    Lack of Transportation (Non-Medical): No  Physical Activity: Insufficiently Active (12/27/2022)   Exercise Vital Sign    Days of Exercise per Week: 4 days    Minutes of Exercise per Session: 20 min  Stress: No Stress Concern Present (12/27/2022)   [employer] of Occupational Health - Occupational Stress Questionnaire    Feeling of Stress : Only a little  Social Connections: Moderately Isolated (12/27/2022)   Social Connection and Isolation Panel    Frequency of Communication with Friends and Family: More than three times a week    Frequency of Social Gatherings with Friends and Family: Once a week    Attends Religious Services: Never    Database administrator or Organizations: No    Attends Engineer, structural: Not on file    Marital Status: Married  Catering manager Violence: Not At Risk (08/24/2023)   Humiliation, Afraid, Rape, and Kick questionnaire    Fear of Current or Ex-Partner: No    Emotionally Abused: No    Physically Abused: No    Sexually Abused: No    FAMILY HISTORY: Family History  Problem Relation Age of Onset   Stroke Mother    Alzheimer's disease Father    Diabetes Maternal Grandmother    Heart disease Maternal Grandmother    Heart attack Maternal Grandmother    Diabetes Maternal Grandfather    Heart disease Maternal Grandfather    Alzheimer's disease Paternal Grandmother    Dementia Paternal Grandmother    Alzheimer's disease Paternal Grandfather    Dementia Paternal Grandfather     Alcohol abuse Paternal Grandfather     Review of Systems  Constitutional:  Positive for fatigue. Negative for appetite change, chills, fever and unexpected weight change.  HENT:   Negative for hearing loss, lump/mass and trouble swallowing.   Eyes:  Negative for eye problems and icterus.  Respiratory:  Negative for chest tightness, cough and shortness of breath.   Cardiovascular:  Positive for palpitations. Negative for chest pain and leg swelling.  Gastrointestinal:  Negative for abdominal distention, abdominal pain, constipation, diarrhea, nausea and vomiting.  Endocrine: Negative for hot flashes.  Genitourinary:  Negative for difficulty urinating.   Musculoskeletal:  Negative for arthralgias.  Skin:  Negative for itching and rash.  Neurological:  Negative for dizziness, extremity weakness, headaches and numbness.  Hematological:  Negative for adenopathy. Does not bruise/bleed easily.  Psychiatric/Behavioral:  Negative for depression. The patient is not nervous/anxious.       PHYSICAL EXAMINATION    Vitals:   05/11/24 0852  BP: 122/75  Pulse: 74  Resp: 17  Temp: 98.6 F (37 C)  SpO2: 100%    Physical Exam Constitutional:      General: She is not in acute distress.    Appearance: Normal appearance. She is not toxic-appearing.  HENT:     Head: Normocephalic and atraumatic.     Mouth/Throat:     Mouth: Mucous membranes are moist.     Pharynx: Oropharynx is clear. No oropharyngeal exudate or posterior oropharyngeal erythema.  Eyes:     General: No scleral icterus. Cardiovascular:     Rate and Rhythm: Normal rate and regular rhythm.     Pulses: Normal pulses.     Heart sounds: Normal heart sounds.  Pulmonary:     Effort: Pulmonary effort is normal.     Breath sounds: Normal breath sounds.  Abdominal:     General: Abdomen is flat. Bowel sounds are normal. There is no distension.     Palpations: Abdomen is soft.     Tenderness: There is no abdominal tenderness.   Musculoskeletal:        General: No swelling.     Cervical back: Neck supple.  Lymphadenopathy:     Cervical: No cervical adenopathy.  Skin:    General: Skin is warm and dry.     Findings: No rash.  Neurological:     General: No focal deficit present.     Mental Status: She is alert.  Psychiatric:        Mood and Affect: Mood normal.        Behavior: Behavior normal.     LABORATORY DATA:  CBC    Component Value Date/Time   WBC 3.8 (L) 05/11/2024 0805   WBC 10.0 08/11/2023 0353   RBC 3.60 (L) 05/11/2024 0805   HGB 11.5 (L) 05/11/2024 0805   HGB 11.6 04/08/2022 1037   HCT 33.3 (L) 05/11/2024 0805   HCT 35.8 04/08/2022 1037   PLT 198 05/11/2024 0805   PLT 244 04/08/2022 1037   MCV 92.5 05/11/2024 0805   MCV 89 04/08/2022 1037   MCH 31.9 05/11/2024 0805   MCHC 34.5 05/11/2024 0805   RDW 14.8 05/11/2024 0805   RDW 12.1 04/08/2022 1037   LYMPHSABS 0.6 (L) 05/11/2024 0805   LYMPHSABS 1.3 04/08/2022 1037   MONOABS 0.4 05/11/2024 0805   EOSABS 0.8 (H) 05/11/2024 0805   EOSABS 0.1 04/08/2022 1037   BASOSABS 0.0 05/11/2024 0805   BASOSABS 0.0 04/08/2022 1037    CMP     Component Value Date/Time   NA 138 05/11/2024 0805   NA 140 04/08/2022 1037   K 3.6 05/11/2024 0805   CL 106 05/11/2024 0805   CO2 27 05/11/2024 0805   GLUCOSE 96 05/11/2024 0805   BUN 8 05/11/2024 0805   BUN 9 04/08/2022 1037   CREATININE 0.58 05/11/2024 0805   CALCIUM  9.2 05/11/2024 0805   PROT 6.8 05/11/2024 0805   PROT 6.5 04/08/2022 1037   ALBUMIN 4.2 05/11/2024 0805   ALBUMIN 4.3 04/08/2022 1037   AST 19 05/11/2024 0805   ALT 10 05/11/2024 0805   ALKPHOS 89 05/11/2024 0805   BILITOT 0.5 05/11/2024 0805   GFRNONAA >60 05/11/2024 0805   GFRAA 132 11/04/2020 1208     ASSESSMENT and THERAPY PLAN:  Malignant neoplasm of lower-inner quadrant of right breast of female, estrogen receptor negative (HCC) 01/10/2023:Palpable lump right axilla x 2 weeks bulky axillary lymphadenopathy  ultrasound breast: 2 masses at 4 o'clock position 1.1 cm and 0.6 cm multiple axillary lymph nodes largest 4.5 cm also level 2 and 3 (subpectoral lymph node as well): Biopsy: Grade 3 IDC ER 0%, PR 0%, Ki-67 95%, HER2 1+  01/25/2023: PET/CT: Right breast cancer, right axilla, retropectoral and right cervical lymph nodes   Treatment plan: Neoadjuvant chemotherapy with Taxol  carbo Keytruda  weekly x 12 followed by Adriamycin  Cytoxan  Keytruda  followed by Keytruda  maintenance until 12/30/2023 07/27/2023: Bilateral mastectomies: Left mastectomy: Benign, right mastectomy: No evidence of residual cancer, 8/20 lymph nodes positive with extranodal extension in 2 lymph nodes Adjuvant radiation therapy 09/08/2023-10/24/2022 Capecitabine  (completed June 2025) with Keytruda  (completed 12/29/2023) --------------------------------------------------------------------------------------- 03/09/2024: PET/CT: 4 mm node right axilla SUV 3.7, right subpectoral activity SUV 3.4 (possible 5 mm node) right apical 7 mm nodule is new compared to 12/23/2023 with an SUV of 2.3, right upper lobe 1 cm nodule new with a SUV of 3.6, left upper lobe nodule 7 mm SUV 4.2, soft tissue fullness proximal duodenum SUV 8.7 (possible duodenitis)   EGD: Bx: Duodenitis Lung FNA: malignant cells consistent with breast primary (triple negative) Caris molecular testing: T p53, TMB 10.3 Brain MRI: Neg   Treatment plan: Enhertu  started 03/28/24,   Assessment and Plan Assessment & Plan 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 Currently on Enhertu  therapy. Discussed chronic cancer survivorship and emphasized achieving a chronic cancer state for a longer, fulfilling life. - Continue Enhertu  therapy at current dose. - Schedule CT chest, abdomen, and pelvis for the week of August 29th. - Schedule survivorship visit for October.  Leukopenia - Administer blood cell growth  factor shot. - Re-evaluate white blood cell count in three weeks.  Palpitations Intermittent palpitations possibly related to hormonal changes or Enhertu  therapy. - Monitor heart rate and palpitations. - Continue current dose of Enhertu  and reassess at next visit.    All questions were answered. The patient knows to call the clinic with any problems, questions or concerns. We can certainly see the patient much sooner if necessary.  Total encounter time:20 minutes*in face-to-face visit time, chart review, lab review, care coordination, order entry, and documentation of the encounter time.    Morna Kendall, NP 05/13/24 1:54 PM Medical Oncology and Hematology [facility] [address] [place], [place] [address] Tel. [phone]    Fax. [phone]  *Total Encounter Time as defined by the Centers for Medicare and Medicaid Services includes, in addition to the face-to-face time of a patient visit (documented in the note above) non-face-to-face time: obtaining and reviewing outside history, ordering and reviewing medications, tests or procedures, care coordination (communications with other health care professionals or caregivers) and documentation in the medical record.

## 2024-05-14 ENCOUNTER — Ambulatory Visit

## 2024-05-16 ENCOUNTER — Ambulatory Visit (HOSPITAL_COMMUNITY)
Admission: RE | Admit: 2024-05-16 | Discharge: 2024-05-16 | Disposition: A | Discharge status: Home / Self Care | Source: Ambulatory Visit | Attending: Hematology and Oncology | Admitting: Hematology and Oncology

## 2024-05-16 DIAGNOSIS — C50311 Malignant neoplasm of lower-inner quadrant of right female breast: Secondary | ICD-10-CM | POA: Diagnosis present

## 2024-05-16 DIAGNOSIS — Z171 Estrogen receptor negative status [ER-]: Secondary | ICD-10-CM | POA: Diagnosis present

## 2024-05-16 MED ORDER — IOHEXOL 300 MG/ML  SOLN
100.0000 mL | Freq: Once | INTRAMUSCULAR | Status: AC | PRN
  Administered 2024-05-16 (×2): 100 mL via INTRAVENOUS

## 2024-05-16 NOTE — Progress Notes (Signed)
   PROVIDER:  PUJA GOSAI MACZIS, PA  MRN: [id number] DOB: 1980/10/11 DATE OF ENCOUNTER: 05/16/2024 Interval History:   Suzanne Tucker is a 43 y.o. female who underwent port placement for recurrent breast cancer on 03/27/2024 by Dr. Aron.  She has presented to the office today with concerns of possible stitches protruding from the incision as well as the medial portion of the incision with an area of firmness underneath.  She is not sure if this is part of the port.  She states they had no issues accessing the port last week.  She denies pain.  She states there is still some pain on the right chest wall especially when laying down.  She completed physical therapy.  Review of Systems:   ROS All other systems reviewed and are negative.  Medications:   Current Outpatient Medications on File Prior to Visit  Medication Sig Dispense Refill  . fluticasone  propionate (FLONASE ) 50 mcg/actuation nasal spray Place 2 sprays into both nostrils once daily    . multivitamin tablet Take 1 tablet by mouth once daily    . pantoprazole  (PROTONIX ) 40 MG DR tablet Take 40 mg by mouth    . doxycycline  (VIBRAMYCIN ) 100 MG capsule Take by mouth    . sodium chloride  1,000 mg soluble tablet Take 1 g by mouth 3 (three) times daily     No current facility-administered medications on file prior to visit.    Physical Examination:   There were no vitals taken for this visit. General: Well-developed, well-nourished, in no acute distress.   Left chest wall: Incision is clean dry and intact, there is an area of firmness on the medial aspect of the incision but difficult to discern if this is suture material or part of the port.  There is no overlying redness, tenderness, or drainage   Assessment and Plan:   Diagnoses and all orders for this visit:  S/P mastectomy, bilateral -     Ambulatory Referral to Physical Therapy  Malignant neoplasm of lower-inner quadrant of right breast of female, estrogen receptor  negative (CMS/HHS-HCC)  Breast cancer metastasized to axillary lymph node, right (CMS/HHS-HCC)    Suzanne Tucker is status post bilateral mastectomies in October 2024 with recent port placement in June 2025.  On exam, there are no signs of infection at the port site.  There is no opening in the incision and the port is not exposed.  She will continue to use the port as planned but will let us  know if anything changes.  There were no visible sutures that could be trimmed today.  We placed a referral back to physical therapy due to pain/tightness on the right chest wall.  I sent Dr. Aron message today with an update.  Return for appt with surgeon as planned.  Puja Maczis, [facility]

## 2024-05-21 ENCOUNTER — Other Ambulatory Visit: Payer: Self-pay | Admitting: General Surgery

## 2024-05-22 NOTE — Progress Notes (Signed)
 INSTRUCTIONS ONLY  Anesthesia review: Y  Patient verbally denies any shortness of breath, fever, cough and chest pain during phone call   -------------  SDW INSTRUCTIONS given:  Your procedure is scheduled on Wednesday, August 20th.  Report to [facility] Main Entrance A at 0630 A.M., and check in at the Admitting office.  Call this number if you have problems the morning of surgery:  [phone]   Remember:  Do not eat after midnight the night before your surgery  You may drink clear liquids until 0600 the morning of your surgery.   Clear liquids allowed are: Water , Non-Citrus Juices (without pulp), Carbonated Beverages, Clear Tea, Black Coffee Only, and Gatorade    Take these medicines the morning of surgery with A SIP OF WATER   fexofenadine  (ALLEGRA )  fluticasone  (FLONASE )  pantoprazole  (PROTONIX )  acetaminophen  (TYLENOL )-if needed ALPRAZolam  (XANAX )-if needed ondansetron  (ZOFRAN )-if needed prochlorperazine  (COMPAZINE )-if needed  As of today, STOP taking any Aspirin (unless otherwise instructed by your surgeon) Aleve , Naproxen , Ibuprofen , Motrin , Advil , Goody's, BC's, all herbal medications, fish oil, and all vitamins.                      Do not wear jewelry, make up, or nail polish            Do not wear lotions, powders, perfumes/colognes, or deodorant.            Do not shave 48 hours prior to surgery.  Men may shave face and neck.            Do not bring valuables to the hospital.            [facility] is not responsible for any belongings or valuables.  Do NOT Smoke (Tobacco/Vaping) 24 hours prior to your procedure If you use a CPAP at night, you may bring all equipment for your overnight stay.   Contacts, glasses, dentures or bridgework may not be worn into surgery.      For patients admitted to the hospital, discharge time will be determined by your treatment team.   Patients discharged the day of surgery will not be allowed to drive home, and someone needs  to stay with them for 24 hours.    Special instructions:   [facility]- Preparing For Surgery  Before surgery, you can play an important role. Because skin is not sterile, your skin needs to be as free of germs as possible. You can reduce the number of germs on your skin by washing with CHG (chlorahexidine gluconate) Soap before surgery.  CHG is an antiseptic cleaner which kills germs and bonds with the skin to continue killing germs even after washing.    Oral Hygiene is also important to reduce your risk of infection.  Remember - BRUSH YOUR TEETH THE MORNING OF SURGERY WITH YOUR REGULAR TOOTHPASTE  Please do not use if you have an allergy to CHG or antibacterial soaps. If your skin becomes reddened/irritated stop using the CHG.  Do not shave (including legs and underarms) for at least 48 hours prior to first CHG shower. It is OK to shave your face.  Please follow these instructions carefully.   Shower the NIGHT BEFORE SURGERY and the MORNING OF SURGERY with DIAL Soap.   Pat yourself dry with a CLEAN TOWEL.  Wear CLEAN PAJAMAS to bed the night before surgery  Place CLEAN SHEETS on your bed the night of your first shower and DO NOT SLEEP WITH PETS.   Day of  Surgery: Please shower morning of surgery  Wear Clean/Comfortable clothing the morning of surgery Do not apply any deodorants/lotions.   Remember to brush your teeth WITH YOUR REGULAR TOOTHPASTE.   Questions were answered. Patient verbalized understanding of instructions.

## 2024-05-23 ENCOUNTER — Ambulatory Visit (HOSPITAL_COMMUNITY)
Admission: RE | Admit: 2024-05-23 | Discharge: 2024-05-23 | Disposition: A | Discharge status: Home / Self Care | Attending: General Surgery | Admitting: General Surgery

## 2024-05-23 ENCOUNTER — Ambulatory Visit (HOSPITAL_COMMUNITY)

## 2024-05-23 ENCOUNTER — Encounter (HOSPITAL_COMMUNITY): Payer: Self-pay | Admitting: General Surgery

## 2024-05-23 ENCOUNTER — Encounter (HOSPITAL_COMMUNITY)
Admission: RE | Disposition: A | Discharge status: Home / Self Care | Payer: Self-pay | Source: Home / Self Care | Attending: General Surgery

## 2024-05-23 ENCOUNTER — Ambulatory Visit: Admitting: Gastroenterology

## 2024-05-23 ENCOUNTER — Other Ambulatory Visit: Payer: Self-pay

## 2024-05-23 DIAGNOSIS — T82848A Pain from vascular prosthetic devices, implants and grafts, initial encounter: Secondary | ICD-10-CM | POA: Diagnosis present

## 2024-05-23 DIAGNOSIS — K219 Gastro-esophageal reflux disease without esophagitis: Secondary | ICD-10-CM | POA: Diagnosis not present

## 2024-05-23 DIAGNOSIS — C773 Secondary and unspecified malignant neoplasm of axilla and upper limb lymph nodes: Secondary | ICD-10-CM | POA: Insufficient documentation

## 2024-05-23 DIAGNOSIS — C50911 Malignant neoplasm of unspecified site of right female breast: Secondary | ICD-10-CM | POA: Diagnosis not present

## 2024-05-23 DIAGNOSIS — G4733 Obstructive sleep apnea (adult) (pediatric): Secondary | ICD-10-CM | POA: Diagnosis not present

## 2024-05-23 DIAGNOSIS — C50311 Malignant neoplasm of lower-inner quadrant of right female breast: Secondary | ICD-10-CM | POA: Diagnosis not present

## 2024-05-23 DIAGNOSIS — Y718 Miscellaneous cardiovascular devices associated with adverse incidents, not elsewhere classified: Secondary | ICD-10-CM | POA: Diagnosis not present

## 2024-05-23 DIAGNOSIS — F32A Depression, unspecified: Secondary | ICD-10-CM | POA: Diagnosis not present

## 2024-05-23 DIAGNOSIS — Z171 Estrogen receptor negative status [ER-]: Secondary | ICD-10-CM | POA: Insufficient documentation

## 2024-05-23 DIAGNOSIS — G4730 Sleep apnea, unspecified: Secondary | ICD-10-CM

## 2024-05-23 HISTORY — PX: PORT A CATH REVISION: SHX6033

## 2024-05-23 SURGERY — PORT A CATH REVISION
Anesthesia: General | Laterality: Right

## 2024-05-23 MED ORDER — CHLORHEXIDINE GLUCONATE 0.12 % MT SOLN
15.0000 mL | Freq: Once | OROMUCOSAL | Status: AC
  Administered 2024-05-23: 15 mL via OROMUCOSAL
  Filled 2024-05-23: qty 15

## 2024-05-23 MED ORDER — LACTATED RINGERS IV SOLN: INTRAVENOUS | Status: DC

## 2024-05-23 MED ORDER — CHLORHEXIDINE GLUCONATE CLOTH 2 % EX PADS: 6.0000 | MEDICATED_PAD | Freq: Once | CUTANEOUS | Status: DC

## 2024-05-23 MED ORDER — LIDOCAINE 2% (20 MG/ML) 5 ML SYRINGE
INTRAMUSCULAR | Status: AC
  Filled 2024-05-23: qty 5

## 2024-05-23 MED ORDER — LIDOCAINE HCL 1 % IJ SOLN
INTRAMUSCULAR | Status: AC
  Filled 2024-05-23: qty 20

## 2024-05-23 MED ORDER — HEPARIN SOD (PORK) LOCK FLUSH 100 UNIT/ML IV SOLN
INTRAVENOUS | Status: AC
  Filled 2024-05-23: qty 5

## 2024-05-23 MED ORDER — MIDAZOLAM HCL 2 MG/2ML IJ SOLN
INTRAMUSCULAR | Status: DC | PRN
  Administered 2024-05-23: 2 mg via INTRAVENOUS

## 2024-05-23 MED ORDER — FENTANYL CITRATE (PF) 250 MCG/5ML IJ SOLN
INTRAMUSCULAR | Status: AC
  Filled 2024-05-23: qty 5

## 2024-05-23 MED ORDER — ONDANSETRON HCL 4 MG/2ML IJ SOLN
INTRAMUSCULAR | Status: AC
  Filled 2024-05-23: qty 2

## 2024-05-23 MED ORDER — LIDOCAINE 2% (20 MG/ML) 5 ML SYRINGE
INTRAMUSCULAR | Status: DC | PRN
  Administered 2024-05-23: 60 mg via INTRAVENOUS

## 2024-05-23 MED ORDER — CEFAZOLIN SODIUM-DEXTROSE 2-4 GM/100ML-% IV SOLN
2.0000 g | INTRAVENOUS | Status: AC
  Administered 2024-05-23: 2 g via INTRAVENOUS
  Filled 2024-05-23: qty 100

## 2024-05-23 MED ORDER — ORAL CARE MOUTH RINSE: 15.0000 mL | Freq: Once | OROMUCOSAL | Status: AC

## 2024-05-23 MED ORDER — ACETAMINOPHEN 500 MG PO TABS: 1000.0000 mg | ORAL_TABLET | ORAL | Status: AC

## 2024-05-23 MED ORDER — PROPOFOL 10 MG/ML IV BOLUS
INTRAVENOUS | Status: DC | PRN
  Administered 2024-05-23 (×2): 30 mg via INTRAVENOUS
  Administered 2024-05-23: 20 mg via INTRAVENOUS
  Administered 2024-05-23: 50 mg via INTRAVENOUS

## 2024-05-23 MED ORDER — ACETAMINOPHEN 500 MG PO TABS
1000.0000 mg | ORAL_TABLET | Freq: Once | ORAL | Status: AC
  Administered 2024-05-23: 1000 mg via ORAL
  Filled 2024-05-23: qty 2

## 2024-05-23 MED ORDER — ONDANSETRON HCL 4 MG/2ML IJ SOLN
INTRAMUSCULAR | Status: DC | PRN
  Administered 2024-05-23: 4 mg via INTRAVENOUS

## 2024-05-23 MED ORDER — 0.9 % SODIUM CHLORIDE (POUR BTL) OPTIME
TOPICAL | Status: DC | PRN
  Administered 2024-05-23: 1000 mL

## 2024-05-23 MED ORDER — FENTANYL CITRATE (PF) 250 MCG/5ML IJ SOLN
INTRAMUSCULAR | Status: DC | PRN
  Administered 2024-05-23: 50 ug via INTRAVENOUS

## 2024-05-23 MED ORDER — DEXAMETHASONE SODIUM PHOSPHATE 10 MG/ML IJ SOLN
INTRAMUSCULAR | Status: AC
Start: 2024-05-23 — End: 2024-05-23
  Filled 2024-05-23: qty 1

## 2024-05-23 MED ORDER — BUPIVACAINE-EPINEPHRINE (PF) 0.5% -1:200000 IJ SOLN
INTRAMUSCULAR | Status: AC
  Filled 2024-05-23: qty 30

## 2024-05-23 MED ORDER — MIDAZOLAM HCL 2 MG/2ML IJ SOLN
INTRAMUSCULAR | Status: AC
  Filled 2024-05-23: qty 2

## 2024-05-23 MED ORDER — PHENYLEPHRINE 80 MCG/ML (10ML) SYRINGE FOR IV PUSH (FOR BLOOD PRESSURE SUPPORT)
PREFILLED_SYRINGE | INTRAVENOUS | Status: DC | PRN
  Administered 2024-05-23: 80 ug via INTRAVENOUS

## 2024-05-23 MED ORDER — LIDOCAINE HCL 1 % IJ SOLN
INTRAMUSCULAR | Status: DC | PRN
  Administered 2024-05-23: 12 mL via INTRAMUSCULAR

## 2024-05-23 MED ORDER — PROPOFOL 500 MG/50ML IV EMUL
INTRAVENOUS | Status: DC | PRN
  Administered 2024-05-23: 125 ug/kg/min via INTRAVENOUS

## 2024-05-23 MED ORDER — DEXAMETHASONE SODIUM PHOSPHATE 10 MG/ML IJ SOLN
INTRAMUSCULAR | Status: DC | PRN
  Administered 2024-05-23: 10 mg via INTRAVENOUS

## 2024-05-23 MED ORDER — HEPARIN SOD (PORK) LOCK FLUSH 100 UNIT/ML IV SOLN
INTRAVENOUS | Status: DC | PRN
Start: 2024-05-23 — End: 2024-05-23
  Administered 2024-05-23: 500 [IU] via INTRAVENOUS

## 2024-05-23 MED ORDER — PHENYLEPHRINE HCL-NACL 20-0.9 MG/250ML-% IV SOLN
INTRAVENOUS | Status: DC | PRN
Start: 2024-05-23 — End: 2024-05-23
  Administered 2024-05-23: 10 ug/min via INTRAVENOUS

## 2024-05-23 SURGICAL SUPPLY — 38 items
BAG COUNTER SPONGE SURGICOUNT (BAG) ×1 IMPLANT
BAG DECANTER FOR FLEXI CONT (MISCELLANEOUS) ×1 IMPLANT
CANISTER SUCTION 3000ML PPV (SUCTIONS) IMPLANT
CHLORAPREP W/TINT 26 (MISCELLANEOUS) ×1 IMPLANT
COVER SURGICAL LIGHT HANDLE (MISCELLANEOUS) ×1 IMPLANT
COVER TRANSDUCER ULTRASND GEL (DISPOSABLE) IMPLANT
DERMABOND ADVANCED .7 DNX12 (GAUZE/BANDAGES/DRESSINGS) ×1 IMPLANT
DRAPE C-ARM 42X120 X-RAY (DRAPES) ×1 IMPLANT
DRAPE CHEST BREAST 15X10 FENES (DRAPES) ×1 IMPLANT
DRAPE WARM FLUID 44X44 (DRAPES) IMPLANT
ELECT COATED BLADE 2.86 ST (ELECTRODE) ×1 IMPLANT
ELECTRODE REM PT RTRN 9FT ADLT (ELECTROSURGICAL) ×1 IMPLANT
GAUZE 4X4 16PLY ~~LOC~~+RFID DBL (SPONGE) ×1 IMPLANT
GEL ULTRASOUND 20GR AQUASONIC (MISCELLANEOUS) IMPLANT
GLOVE BIO SURGEON STRL SZ 6 (GLOVE) ×1 IMPLANT
GLOVE INDICATOR 6.5 STRL GRN (GLOVE) ×1 IMPLANT
GOWN STRL REUS W/ TWL LRG LVL3 (GOWN DISPOSABLE) ×1 IMPLANT
GOWN STRL REUS W/ TWL XL LVL3 (GOWN DISPOSABLE) ×1 IMPLANT
KIT BASIN OR (CUSTOM PROCEDURE TRAY) ×1 IMPLANT
KIT PORT POWER 8FR ISP CVUE (Port) IMPLANT
KIT TURNOVER KIT B (KITS) ×1 IMPLANT
NDL 22X1.5 STRL (OR ONLY) (MISCELLANEOUS) ×1 IMPLANT
NEEDLE 22X1.5 STRL (OR ONLY) (MISCELLANEOUS) ×1 IMPLANT
NS IRRIG 1000ML POUR BTL (IV SOLUTION) ×1 IMPLANT
PAD ARMBOARD POSITIONER FOAM (MISCELLANEOUS) ×1 IMPLANT
PENCIL BUTTON HOLSTER BLD 10FT (ELECTRODE) ×1 IMPLANT
POSITIONER HEAD DONUT 9IN (MISCELLANEOUS) ×1 IMPLANT
SPIKE FLUID TRANSFER (MISCELLANEOUS) ×2 IMPLANT
SUT MON AB 4-0 PC3 18 (SUTURE) ×1 IMPLANT
SUT PROLENE 2 0 SH DA (SUTURE) ×2 IMPLANT
SUT VIC AB 2-0 SH 27XBRD (SUTURE) IMPLANT
SUT VIC AB 3-0 SH 27X BRD (SUTURE) ×1 IMPLANT
SYR 5ML LUER SLIP (SYRINGE) ×1 IMPLANT
TOWEL GREEN STERILE (TOWEL DISPOSABLE) ×1 IMPLANT
TOWEL GREEN STERILE FF (TOWEL DISPOSABLE) ×1 IMPLANT
TRAY LAPAROSCOPIC MC (CUSTOM PROCEDURE TRAY) ×1 IMPLANT
TUBE CONNECTING 12X1/4 (SUCTIONS) IMPLANT
YANKAUER SUCT BULB TIP NO VENT (SUCTIONS) IMPLANT

## 2024-05-23 NOTE — Anesthesia Postprocedure Evaluation (Signed)
 Anesthesia Post Note  Patient: Ibtisam Alexandra Limones  Procedure(s) Performed: REVISION, PORT-A-CATH INSERTION (Right)     Patient location during evaluation: PACU Anesthesia Type: MAC Level of consciousness: awake and alert Pain management: pain level controlled Vital Signs Assessment: post-procedure vital signs reviewed and stable Respiratory status: spontaneous breathing, nonlabored ventilation and respiratory function stable Cardiovascular status: blood pressure returned to baseline and stable Postop Assessment: no apparent nausea or vomiting Anesthetic complications: no   There were no known notable events for this encounter.  Last Vitals:  Vitals:   05/23/24 1017 05/23/24 1030  BP: (!) 90/53 98/74  Pulse: 65 62  Resp: 17 13  Temp:  (!) 36.2 C  SpO2: 97% 100%    Last Pain:  Vitals:   05/23/24 1030  TempSrc:   PainSc: 0-No pain                 Almarie CHRISTELLA Marchi

## 2024-05-23 NOTE — H&P (Signed)
 PROVIDER: JINA CLAIR NEPHEW, MD Patient Care Team: Purcell Rubin, MD as PCP - General (Internal Medicine) Tucker Suzanne CLAIR, MD as Consulting Provider (Surgical Oncology) Izell Lauraine Norris, MD (Radiation Oncology) Gudena, Vinay K, MD (Hematology and Oncology)  MRN: [id number] DOB: [birth year] DATE OF ENCOUNTER: 05/19/2024 Initial History:   Pt presented with a new breast cancer dx right side 01/2023. She felt a mass in her axilla. She went to her doctor who also thought there was an additional mass near the areola. Diagnostic imaging was performed. This showed a 1.1 cm mass at 4 o'clock with a 6 mm satellite nodule. There were also > 4 abnormal nodes seen in the axilla. The dominant breast mass and one of the nodes were biopsied. 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   Pt was set up for MR today and had that as well. There was some additional non mass enhancement adjacent to the tumor and the two tumors in the breast did appear contiguous.   Pt has no family history of cancer.  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 She has a 41 yo daughter in the 9th grade.   Pt had negative genetic testing. Patient has been on neoadjuvant chemotherapy. She required admission for neutropenic fever. She has also had some issues with her fingernails.   Pt has had repeat MR and has complete imaging response to tx. She has overall felt pretty good during surgery. She can't feel her breast mass anymore, but can still feel something in her axilla. She can't feel anything in her neck anymore.   Patient underwent bilateral mastectomy with right axillary lymph node dissection on July 27, 2023. Patient was doing well initially, but on the sixth started developing high fevers. She was admitted to the hospital for purulent discharge and redness. She was placed on IV antibiotics for around 48 hours and defervesced. The purulent drainage stopped.    Interval History:   Her infections got better and her PET appeared to be negative. Her circulating tumor DNA was negative. After discussion of risks and benefits, we removed her port. Unfortunately, her next  PET did show disease, and we did have to replace her port. She called due to some issues with a piece of her port rubbing up against her skin. She is here for me to look at this.  PET 08/19/23 IMPRESSION: 1. Interval resection of multiple hypermetabolic RIGHT axillary and sub pectoralis metastatic lymph nodes. 2. Mild metabolic activity at the resection site is favored benign inflammatory activity. 3. Mild metabolic activity slightly removed from the resection site beneath the RIGHT pectoralis muscle is indeterminate. Probable residual reactive adenopathy versus central metastatic adenopathy 4. No evidence of metastatic adenopathy in the chest, abdomen, or pelvis. 5. No evidence of visceral metastasis or skeletal metastasis. 6. Interval resolution of hypermetabolic LEFT adnexal activity.   Pathology 07/27/23 A. BREAST, LEFT, MASTECTOMY: Benign breast tissue with fibrocystic changes including stromal fibrosis, adenosis and microcysts. Negative for atypia or malignancy.  B. BREAST, RIGHT, MASTECTOMY: Benign breast tissue with fibrocystic changes including stromal fibrosis, adenosis and microcysts and treatment effect. Biopsy site changes and biopsy clip identified. No evidence of residual carcinoma. One lymph node, negative for metastatic carcinoma (0/1). See oncology table.  C. LYMPH NODE, RIGHT AXILLARY, CONTENTS: Eight out of twenty lymph nodes, positive for metastatic carcinoma (8/20). Lymphovascular invasion identified. Largest metastatic focus: 16 mm. Extranodal extension identified in two lymph nodes. Size of extranodal extension:  10 mm and 1.5 mm.   Physical Examination:   It appears that her Port-A-Cath is in place, but due to weight loss, the tails of her  suture are rubbing on her skin and one of them is starting to erode.   Assessment and Plan:   Diagnoses and all orders for this visit:  Breast cancer metastasized to axillary lymph node, right (CMS/HHS-HCC)  Malignant neoplasm of lower-inner quadrant of right breast of female, estrogen receptor negative (CMS/HHS-HCC)  Port-A-Cath in place  Patient is offered revision of the incision.  Will explore the incision and port site.   Anticipate will not completely need port revision However this port will be there for quite a while and there is concern that there will be erosion of incision and high risk of infection of the site if no exploration.   Will do this at the first available opportunity.    No follow-ups on file.  The plan was discussed in detail with the patient today, who expressed understanding. The patient has my contact information, and understands to call me with any additional questions or concerns in the interval. I would be happy to see the patient back sooner if the need arises.

## 2024-05-23 NOTE — Anesthesia Preprocedure Evaluation (Addendum)
 Anesthesia Evaluation  Patient identified by MRN, date of birth, ID band Patient awake    Reviewed: Allergy & Precautions, NPO status , Patient's Chart, lab work & pertinent test results  History of Anesthesia Complications (+) PONV and history of anesthetic complications  Airway Mallampati: I  TM Distance: >3 FB Neck ROM: Full    Dental  (+) Teeth Intact, Dental Advisory Given   Pulmonary sleep apnea (mild OSA, no cpap)    Pulmonary exam normal breath sounds clear to auscultation       Cardiovascular Normal cardiovascular exam Rhythm:Regular Rate:Normal  Echo 03/2024  1. Left ventricular ejection fraction, by estimation, is 60 to 65%. The  left ventricle has normal function. The left ventricle has no regional  wall motion abnormalities. Left ventricular diastolic parameters were  normal. The average left ventricular  global longitudinal strain is -23.2 %. The global longitudinal strain is  normal.   2. Right ventricular systolic function is normal. The right ventricular  size is normal. Tricuspid regurgitation signal is inadequate for assessing  PA pressure.   3. The mitral valve is grossly normal. Trivial mitral valve  regurgitation. No evidence of mitral stenosis.   4. The aortic valve is tricuspid. Aortic valve regurgitation is not  visualized. No aortic stenosis is present.   5. The inferior vena cava is normal in size with greater than 50%  respiratory variability, suggesting right atrial pressure of 3 mmHg.     Neuro/Psych  PSYCHIATRIC DISORDERS  Depression    negative neurological ROS     GI/Hepatic Neg liver ROS,GERD  Medicated and Controlled,,  Endo/Other  negative endocrine ROS    Renal/GU negative Renal ROS  negative genitourinary   Musculoskeletal negative musculoskeletal ROS (+)    Abdominal   Peds  Hematology  (+) Blood dyscrasia, anemia Hb 11.5, plt 198   Anesthesia Other Findings    Reproductive/Obstetrics negative OB ROS                              Anesthesia Physical Anesthesia Plan  ASA: 3  Anesthesia Plan: MAC   Post-op Pain Management: Tylenol  PO (pre-op)*   Induction: Intravenous  PONV Risk Score and Plan: 4 or greater and Ondansetron , Dexamethasone , Midazolam , Propofol  infusion and TIVA  Airway Management Planned: Natural Airway and Simple Face Mask  Additional Equipment: None  Intra-op Plan:   Post-operative Plan:   Informed Consent: I have reviewed the patients History and Physical, chart, labs and discussed the procedure including the risks, benefits and alternatives for the proposed anesthesia with the patient or authorized representative who has indicated his/her understanding and acceptance.     Dental advisory given  Plan Discussed with: CRNA  Anesthesia Plan Comments:          Anesthesia Quick Evaluation

## 2024-05-23 NOTE — Interval H&P Note (Signed)
 History and Physical Interval Note:  05/23/2024 8:57 AM  Suzanne Tucker  has presented today for surgery, with the diagnosis of port in place.  The various methods of treatment have been discussed with the patient and family. After consideration of risks, benefits and other options for treatment, the patient has consented to  Procedure(s): REVISION, PORT-A-CATH INSERTION (Right) as a surgical intervention.  The patient's history has been reviewed, patient examined, no change in status, stable for surgery.  I have reviewed the patient's chart and labs.  Questions were answered to the patient's satisfaction.     Jina Nephew

## 2024-05-23 NOTE — Op Note (Signed)
 PRE-OPERATIVE DIAGNOSIS: port discomfort, suture through skin  POST-OPERATIVE DIAGNOSIS:  Same  PROCEDURE:  Procedure(s): Port revision  SURGEON:  Surgeon(s): Jina Nephew, MD  ANESTHESIA:   MAC  DRAINS: none   LOCAL MEDICATIONS USED:  BUPIVICAINE  and LIDOCAINE    SPECIMEN:  none  DISPOSITION OF SPECIMEN:  N/A  COUNTS:  YES  DICTATION: .Dragon Dictation  PLAN OF CARE: Discharge to home after PACU  PATIENT DISPOSITION:  PACU - hemodynamically stable.  FINDINGS:  prolene sutures with one tail through skin  EBL: min  PROCEDURE:  Patient was identified in the holding area and then taken the operating room where she was placed supine on the operating room table.  MAC anesthesia was induced.  A vertical shoulder roll was placed in the event that the port had to be replaced.  The surgical safety checklist was used and a timeout was performed.  Once everything was correct, we continued.  Local anesthetic was infiltrated along the incision and on the anterior surface of the Port-A-Cath.  A 15 blade was used to open the incision.  The Prolene sutures were identified at the site of discomfort.  The medial prolene suture knot was at the site where the skin was nearly broken through.  This was removed.  A 2-0 Vicryl was used to replace the suture and was secured down tightly to the fascia.  One tail of the lateral suture knot was breaking through the skin at this point was removed and also replaced with a 2-0 Vicryl.  The inferiorly placed sutures were intact.  The port and the tubing were left in place and not manipulated.  The skin was evaluated for hemostasis.  The skin was reapproximated with 3-0 Vicryl and 4-0 Monocryl.  The port was flushed with concentrated heparin .    Patient was allowed to emerge from anesthesia and taken the PACU in stable condition.  Needle, sponge, and instrument counts were correct x 2.

## 2024-05-23 NOTE — Discharge Instructions (Addendum)
 [facility] Office Phone Number [phone]   POST OP INSTRUCTIONS  Always review your discharge instruction sheet given to you by the facility where your surgery was performed.  IF YOU HAVE DISABILITY OR FAMILY LEAVE FORMS, YOU MUST BRING THEM TO THE OFFICE FOR PROCESSING.  DO NOT GIVE THEM TO YOUR DOCTOR.  Take 2 tylenol  (acetominophen) three times a day for 3 days.  If you still have pain, add ibuprofen  with food in between if able to take this (if you have kidney issues or stomach issues, do not take ibuprofen ).  If both of those are not enough, add the narcotic pain pill.  If you find you are needing a lot of this overnight after surgery, call the next morning for a refill.   Take your usually prescribed medications unless otherwise directed If you need a refill on your pain medication, please contact your pharmacy.  They will contact our office to request authorization.  Prescriptions will not be filled after 5pm or on week-ends. You should eat very light the first 24 hours after surgery, such as soup, crackers, pudding, etc.  Resume your normal diet the day after surgery It is common to experience some constipation if taking pain medication after surgery.  Increasing fluid intake and taking a stool softener will usually help or prevent this problem from occurring.  A mild laxative (Milk of Magnesia or Miralax ) should be taken according to package directions if there are no bowel movements after 48 hours. You may shower in 48 hours.  The surgical glue will flake off in 2-3 weeks.   ACTIVITIES:  No strenuous activity or heavy lifting for 1 week.   You may drive when you no longer are taking prescription pain medication, you can comfortably wear a seatbelt, and you can safely maneuver your car and apply brakes. RETURN TO WORK:  __________tbd_______________ Rosine should see your doctor in the office for a follow-up appointment approximately three-four weeks after your surgery.     WHEN TO CALL YOUR DOCTOR: Fever over 101.0 Nausea and/or vomiting. Extreme swelling or bruising. Continued bleeding from incision. Increased pain, redness, or drainage from the incision.  The clinic staff is available to answer your questions during regular business hours.  Please don't hesitate to call and ask to speak to one of the nurses for clinical concerns.  If you have a medical emergency, go to the nearest emergency room or call 911.  A surgeon from [facility] is always on call at the hospital.  For further questions, please visit [email or web address]

## 2024-05-23 NOTE — Transfer of Care (Signed)
 Immediate Anesthesia Transfer of Care Note  Patient: Suzanne Tucker  Procedure(s) Performed: REVISION, PORT-A-CATH INSERTION (Right)  Patient Location: PACU  Anesthesia Type:MAC  Level of Consciousness: awake and drowsy  Airway & Oxygen Therapy: Patient Spontanous Breathing  Post-op Assessment: Report given to RN, Post -op Vital signs reviewed and stable, and Patient moving all extremities  Post vital signs: Reviewed  Last Vitals:  Vitals Value Taken Time  BP 87/53 05/23/24 10:15  Temp 36.4 C 05/23/24 10:13  Pulse 62 05/23/24 10:15  Resp 12 05/23/24 10:15  SpO2 99 % 05/23/24 10:15  Vitals shown include unfiled device data.  Last Pain:  Vitals:   05/23/24 0720  TempSrc:   PainSc: 0-No pain      Patients Stated Pain Goal: 0 (05/23/24 0710)  Complications: No notable events documented.

## 2024-05-24 ENCOUNTER — Encounter (HOSPITAL_COMMUNITY): Payer: Self-pay | Admitting: General Surgery

## 2024-05-24 ENCOUNTER — Encounter: Payer: Self-pay | Admitting: Hematology and Oncology

## 2024-05-24 NOTE — Telephone Encounter (Signed)
 error

## 2024-05-28 ENCOUNTER — Encounter: Admitting: Adult Health

## 2024-05-30 NOTE — Assessment & Plan Note (Signed)
 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%, HER2 1+  01/25/2023: PET/CT: Right breast cancer, right axilla, retropectoral and right cervical lymph nodes   Treatment plan: Neoadjuvant chemotherapy with Taxol  carbo Keytruda  weekly x 12 followed by Adriamycin  Cytoxan  Keytruda  followed by Keytruda  maintenance until 12/30/2023 07/27/2023: Bilateral mastectomies: Left mastectomy: Benign, right mastectomy: No evidence of residual cancer, 8/20 lymph nodes positive with extranodal extension in 2 lymph nodes Adjuvant radiation therapy 09/08/2023-10/24/2022 Capecitabine  (completed June 2025) with Keytruda  (completed 12/29/2023) --------------------------------------------------------------------------------------- 03/09/2024: PET/CT: 4 mm node right axilla SUV 3.7, right subpectoral activity SUV 3.4 (possible 5 mm node) right apical 7 mm nodule is new compared to 12/23/2023 with an SUV of 2.3, right upper lobe 1 cm nodule new with a SUV of 3.6, left upper lobe nodule 7 mm SUV 4.2, soft tissue fullness proximal duodenum SUV 8.7 (possible duodenitis)   EGD: Bx: Duodenitis Lung FNA: malignant cells consistent with breast primary (triple negative) [facility] molecular testing: T p53, TMB 10.3 Brain MRI: Neg   Treatment plan: Enhertu  started 03/28/24, today is cycle 4 Toxicities: Fatigue Throat redness: Thrush: Diflucan  was prescribed and it is better Neutropenia: I recommended adding Neulasta  injection on day 3 and reducing the dosage of her chemotherapy. Chemo induced anemia: Monitoring closely.   CT CAP 05/16/2024: Proceed with today's treatment with ANC of 1.1. CT scans have been ordered after 3 cycles of chemo.

## 2024-05-31 ENCOUNTER — Inpatient Hospital Stay

## 2024-05-31 ENCOUNTER — Inpatient Hospital Stay (HOSPITAL_BASED_OUTPATIENT_CLINIC_OR_DEPARTMENT_OTHER): Admitting: Hematology and Oncology

## 2024-05-31 VITALS — BP 110/70 | HR 96 | Temp 97.9°F | Resp 18 | Ht 61.0 in | Wt 144.5 lb

## 2024-05-31 VITALS — BP 98/68 | HR 86 | Resp 16

## 2024-05-31 DIAGNOSIS — Z171 Estrogen receptor negative status [ER-]: Secondary | ICD-10-CM

## 2024-05-31 DIAGNOSIS — C50311 Malignant neoplasm of lower-inner quadrant of right female breast: Secondary | ICD-10-CM

## 2024-05-31 LAB — CBC WITH DIFFERENTIAL (CANCER CENTER ONLY)
Abs Immature Granulocytes: 0.01 K/uL (ref 0.00–0.07)
Basophils Absolute: 0 K/uL (ref 0.0–0.1)
Basophils Relative: 1 %
Eosinophils Absolute: 1.2 K/uL — ABNORMAL HIGH (ref 0.0–0.5)
Eosinophils Relative: 26 %
HCT: 31.8 % — ABNORMAL LOW (ref 36.0–46.0)
Hemoglobin: 11 g/dL — ABNORMAL LOW (ref 12.0–15.0)
Immature Granulocytes: 0 %
Lymphocytes Relative: 17 %
Lymphs Abs: 0.7 K/uL (ref 0.7–4.0)
MCH: 32.1 pg (ref 26.0–34.0)
MCHC: 34.6 g/dL (ref 30.0–36.0)
MCV: 92.7 fL (ref 80.0–100.0)
Monocytes Absolute: 0.4 K/uL (ref 0.1–1.0)
Monocytes Relative: 10 %
Neutro Abs: 2.1 K/uL (ref 1.7–7.7)
Neutrophils Relative %: 46 %
Platelet Count: 231 K/uL (ref 150–400)
RBC: 3.43 MIL/uL — ABNORMAL LOW (ref 3.87–5.11)
RDW: 15.4 % (ref 11.5–15.5)
WBC Count: 4.4 K/uL (ref 4.0–10.5)
nRBC: 0 % (ref 0.0–0.2)

## 2024-05-31 LAB — CMP (CANCER CENTER ONLY)
ALT: 16 U/L (ref 0–44)
AST: 22 U/L (ref 15–41)
Albumin: 4.1 g/dL (ref 3.5–5.0)
Alkaline Phosphatase: 75 U/L (ref 38–126)
Anion gap: 4 — ABNORMAL LOW (ref 5–15)
BUN: 11 mg/dL (ref 6–20)
CO2: 29 mmol/L (ref 22–32)
Calcium: 9.1 mg/dL (ref 8.9–10.3)
Chloride: 107 mmol/L (ref 98–111)
Creatinine: 0.55 mg/dL (ref 0.44–1.00)
GFR, Estimated: >60 mL/min (ref >60)
Glucose, Bld: 105 mg/dL — ABNORMAL HIGH (ref 70–99)
Potassium: 3.7 mmol/L (ref 3.5–5.1)
Sodium: 140 mmol/L (ref 135–145)
Total Bilirubin: 0.5 mg/dL (ref 0.0–1.2)
Total Protein: 6.5 g/dL (ref 6.5–8.1)

## 2024-05-31 MED ORDER — DEXAMETHASONE SODIUM PHOSPHATE 10 MG/ML IJ SOLN
10.0000 mg | Freq: Once | INTRAMUSCULAR | Status: AC
  Administered 2024-05-31: 10 mg via INTRAVENOUS
  Filled 2024-05-31: qty 1

## 2024-05-31 MED ORDER — DIPHENHYDRAMINE HCL 25 MG PO CAPS
25.0000 mg | ORAL_CAPSULE | Freq: Once | ORAL | Status: AC
  Administered 2024-05-31: 25 mg via ORAL
  Filled 2024-05-31: qty 1

## 2024-05-31 MED ORDER — ACETAMINOPHEN 325 MG PO TABS
650.0000 mg | ORAL_TABLET | Freq: Once | ORAL | Status: AC
  Administered 2024-05-31: 650 mg via ORAL
  Filled 2024-05-31: qty 2

## 2024-05-31 MED ORDER — PALONOSETRON HCL INJECTION 0.25 MG/5ML
0.2500 mg | Freq: Once | INTRAVENOUS | Status: AC
  Administered 2024-05-31: 0.25 mg via INTRAVENOUS
  Filled 2024-05-31: qty 5

## 2024-05-31 MED ORDER — PEGFILGRASTIM 6 MG/0.6ML ~~LOC~~ PSKT
6.0000 mg | PREFILLED_SYRINGE | Freq: Once | SUBCUTANEOUS | Status: AC
  Administered 2024-05-31: 6 mg via SUBCUTANEOUS
  Filled 2024-05-31: qty 0.6

## 2024-05-31 MED ORDER — FAM-TRASTUZUMAB DERUXTECAN-NXKI CHEMO 100 MG IV SOLR
4.4000 mg/kg | Freq: Once | INTRAVENOUS | Status: AC
  Administered 2024-05-31: 300 mg via INTRAVENOUS
  Filled 2024-05-31: qty 15

## 2024-05-31 MED ORDER — SODIUM CHLORIDE 0.9% FLUSH: 10.0000 mL | INTRAVENOUS | Status: DC | PRN

## 2024-05-31 MED ORDER — APREPITANT 130 MG/18ML IV EMUL
130.0000 mg | Freq: Once | INTRAVENOUS | Status: AC
  Administered 2024-05-31: 130 mg via INTRAVENOUS
  Filled 2024-05-31: qty 18

## 2024-05-31 MED ORDER — DEXTROSE 5 % IV SOLN: INTRAVENOUS | Status: DC

## 2024-05-31 NOTE — Progress Notes (Unsigned)
 Patient Care Team: [name], MD as PCP - General (Hematology and Oncology) Dann Candyce RAMAN, MD as PCP - Cardiology (Cardiology) Fernande Elspeth BROCKS, MD as PCP - Electrophysiology (Cardiology) [name], MD as Consulting Physician (Hematology and Oncology) Izell Domino, MD as Attending Physician (Radiation Oncology) Aron Shoulders, MD as Consulting Physician (General Surgery) Tyree Nanetta SAILOR, RN as Oncology Nurse Navigator Glean, Stephane BROCKS, RN (Inactive) as Oncology Nurse Navigator  DIAGNOSIS:  Encounter Diagnosis  Name Primary?   Malignant neoplasm of lower-inner quadrant of right breast of female, estrogen receptor negative (HCC) Yes    SUMMARY OF ONCOLOGIC HISTORY: Oncology History  Malignant neoplasm of lower-inner quadrant of right breast of female, estrogen receptor negative (HCC)  01/10/2023 Initial Diagnosis   Palpable lump right axilla x 2 weeks bulky axillary lymphadenopathy ultrasound breast: 2 masses at 4 o'clock position 1.1 cm and 0.6 cm multiple axillary lymph nodes largest 4.5 cm also level 2 and 3 (subpectoral lymph node as well): Biopsy: Grade 3 IDC ER 0%, PR 0%, Ki-67 95%, HER2 1+   01/26/2023 Genetic Testing   Negative [employer] Custom Panel (37 genes).  Report date is 01/26/2023.    The [employer] Custom Cancers + RNA Panel includes sequencing, deletion/duplication, and RNA analysis of the following 37 genes: APC, ATM, AXIN2, BARD1, BMPR1A, BRCA1, BRCA2, BRIP1, CDH1, CDK4*, CDKN2A (p14ARF)*, CDKN2A (p16INK4a), CHEK2, CTNNA1, DICER1, EPCAM*, FH, GREM1*, HOXB13*, MBD4*, MLH1, MSH2, MSH3, MSH6, MUTYH, NF1, NTHL1, PALB2, PMS2, POLD1, POLE, PTEN, RAD51C, RAD51D, SMAD4, SMARCA4, STK11, TP53.  *Genes without RNA analysis.    01/28/2023 - 06/27/2023 Chemotherapy   Patient is on Treatment Plan : BREAST Pembrolizumab  (200) D1 + Carboplatin  (1.5) D1,8,15 + Paclitaxel  (80) D1,8,15 q21d X 4 cycles / Pembrolizumab  (200) D1 + AC D1 q21d x 4 cycles     07/15/2023 - 12/29/2023  Chemotherapy   Patient is on Treatment Plan : BREAST Pembrolizumab  (200) q21d x 27 weeks     09/07/2023 - 10/26/2023 Radiation Therapy   First Treatment Date: 2023-09-07 Last Treatment Date: 2023-10-26   Plan Name: CW_R_BO Site: Chest Wall, Right Technique: 3D Mode: Photon Dose Per Fraction: 1.8 Gy Prescribed Dose (Delivered / Prescribed): 25.2 Gy / 25.2 Gy Prescribed Fxs (Delivered / Prescribed): 14 / 14   Plan Name: CW_R_Neck_Ax Site: Chest Wall, Right Technique: 3D Mode: Photon Dose Per Fraction: 1.8 Gy Prescribed Dose (Delivered / Prescribed): 50.4 Gy / 50.4 Gy Prescribed Fxs (Delivered / Prescribed): 28 / 28   Plan Name: CW_R_Bst Site: Chest Wall, Right Technique: 3D Mode: Photon Dose Per Fraction: 1.8 Gy Prescribed Dose (Delivered / Prescribed): 9 Gy / 9 Gy Prescribed Fxs (Delivered / Prescribed): 5 / 5   Plan Name: CW_R Site: Chest Wall, Right Technique: 3D Mode: Photon Dose Per Fraction: 1.8 Gy Prescribed Dose (Delivered / Prescribed): 25.2 Gy / 25.2 Gy Prescribed Fxs (Delivered / Prescribed): 14 / 14     03/28/2024 -  Chemotherapy   Patient is on Treatment Plan : BREAST Fam-Trastuzumab Deruxtecan-nxki  (Enhertu ) (5.4) q21d       CHIEF COMPLIANT:   HISTORY OF PRESENT ILLNESS: History of Present Illness Suzanne Tucker is a 43 year old female with a history of cancer who presents for follow-up on her treatment progress.  Her cancer treatment is progressing well with stable white blood cell count and hemoglobin levels between 11.5 and 11.0. Platelet count is 231, and electrolytes and kidney function are normal. She experiences no nausea but had an episode of lightheadedness when walking in  the sun. She reports pain in her right arm when sleeping on it, described as a burning sensation. A new, tiny lump has been noticed under her skin. Hair loss is present, and she uses wigs.     ALLERGIES:  is allergic to antihistamines, loratadine-type; kiwi  extract; morphine; oxycontin  [oxycodone ]; tape; tessalon [benzonatate]; tramadol ; codeine; hydrocodone; and latex.  MEDICATIONS:  Current Outpatient Medications  Medication Sig Dispense Refill   acetaminophen  (TYLENOL ) 500 MG tablet Take 1,000 mg by mouth every 6 (six) hours as needed for fever or mild pain (pain score 1-3).     ALPRAZolam  (XANAX ) 0.5 MG tablet Take 1 tablet (0.5 mg total) by mouth 3 (three) times daily as needed for anxiety. 30 tablet 1   calcium  elemental as carbonate (BARIATRIC TUMS ULTRA) 400 MG chewable tablet Chew 2,000 mg by mouth daily as needed for indigestion or heartburn.     dexamethasone  (DECADRON ) 4 MG tablet Take 2 tablets (8 mg) by mouth daily for 3 days starting the day after chemotherapy. Take with food. 30 tablet 1   Dextrose -Fructose-Sod Citrate (NAUZENE) [id number] MG CHEW Chew 1 tablet by mouth daily as needed (Nausea).     fluticasone  (FLONASE ) 50 MCG/ACT nasal spray Place 2 sprays into both nostrils daily as needed for allergies.     lidocaine -prilocaine  (EMLA ) cream Apply to affected area once 30 g 3   Multiple Vitamins-Minerals (WOMENS MULTI VITAMIN & MINERAL PO) Take 1 tablet by mouth daily.     nystatin  (MYCOSTATIN ) 100000 UNIT/ML suspension Take 5 mLs (500,000 Units total) by mouth 4 (four) times daily. 60 mL 0   ondansetron  (ZOFRAN ) 8 MG tablet Take 1 tablet (8 mg total) by mouth every 8 (eight) hours as needed for nausea or vomiting. Start on the third day after chemotherapy. 30 tablet 1   pantoprazole  (PROTONIX ) 40 MG tablet Take 1 tablet (40 mg total) by mouth 2 (two) times daily. Protonix  40mg  PO BID for 8 weeks then reduce to 40 mg daily 180 tablet 3   fexofenadine  (ALLEGRA ) 180 MG tablet Take 180 mg by mouth daily. (Patient not taking: Reported on 05/31/2024)     fluconazole  (DIFLUCAN ) 100 MG tablet Take 1 tablet (100 mg total) by mouth daily. Use as discussed with each chemo treatment (Patient not taking: Reported on 05/31/2024) 30 tablet 1    Melatonin 10 MG TABS Take 2.5 mg by mouth at bedtime. (Patient not taking: Reported on 05/31/2024)     No current facility-administered medications for this visit.    PHYSICAL EXAMINATION: ECOG PERFORMANCE STATUS: 1 - Symptomatic but completely ambulatory  Vitals:   05/31/24 1029  BP: 110/70  Pulse: 96  Resp: 18  Temp: 97.9 F (36.6 C)  SpO2: 100%   Filed Weights   05/31/24 1029  Weight: 144 lb 8 oz (65.5 kg)    Physical Exam   (exam performed in the presence of a chaperone)  LABORATORY DATA:  I have reviewed the data as listed    Latest Ref Rng & Units 05/31/2024    9:58 AM 05/11/2024    8:05 AM 04/23/2024   11:33 AM  CMP  Glucose 70 - 99 mg/dL 894  96  91   BUN 6 - 20 mg/dL 11  8  11    Creatinine 0.44 - 1.00 mg/dL 9.44  9.41  9.37   Sodium 135 - 145 mmol/L 140  138  138   Potassium 3.5 - 5.1 mmol/L 3.7  3.6  3.8   Chloride 98 -  111 mmol/L 107  106  104   CO2 22 - 32 mmol/L 29  27  28    Calcium  8.9 - 10.3 mg/dL 9.1  9.2  9.4   Total Protein 6.5 - 8.1 g/dL 6.5  6.8  6.9   Total Bilirubin 0.0 - 1.2 mg/dL 0.5  0.5  0.7   Alkaline Phos 38 - 126 U/L 75  89  125   AST 15 - 41 U/L 22  19  19    ALT 0 - 44 U/L 16  10  25      Lab Results  Component Value Date   WBC 4.4 05/31/2024   HGB 11.0 (L) 05/31/2024   HCT 31.8 (L) 05/31/2024   MCV 92.7 05/31/2024   PLT 231 05/31/2024   NEUTROABS 2.1 05/31/2024    ASSESSMENT & PLAN:  Malignant neoplasm of lower-inner quadrant of right breast of female, estrogen receptor negative (HCC) 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%, HER2 1+  01/25/2023: PET/CT: Right breast cancer, right axilla, retropectoral and right cervical lymph nodes   Treatment plan: Neoadjuvant chemotherapy with Taxol   carbo Keytruda  weekly x 12 followed by Adriamycin  Cytoxan  Keytruda  followed by Keytruda  maintenance until 12/30/2023 07/27/2023: Bilateral mastectomies: Left mastectomy: Benign, right mastectomy: No evidence of residual cancer, 8/20 lymph nodes positive with extranodal extension in 2 lymph nodes Adjuvant radiation therapy 09/08/2023-10/24/2022 Capecitabine  (completed June 2025) with Keytruda  (completed 12/29/2023) --------------------------------------------------------------------------------------- 03/09/2024: PET/CT: 4 mm node right axilla SUV 3.7, right subpectoral activity SUV 3.4 (possible 5 mm node) right apical 7 mm nodule is new compared to 12/23/2023 with an SUV of 2.3, right upper lobe 1 cm nodule new with a SUV of 3.6, left upper lobe nodule 7 mm SUV 4.2, soft tissue fullness proximal duodenum SUV 8.7 (possible duodenitis)   EGD: Bx: Duodenitis Lung FNA: malignant cells consistent with breast primary (triple negative) [facility] molecular testing: T p53, TMB 10.3 Brain MRI: Neg   Treatment plan: Enhertu  started 03/28/24, today is cycle 4 Toxicities: Fatigue Throat redness: Thrush: Diflucan  was prescribed and it is better Neutropenia: I recommended adding Neulasta  injection on day 3 and reducing the dosage of her chemotherapy. Chemo induced anemia: Monitoring closely.   CT CAP 05/16/2024: Multiple pulmonary nodules in both lung apices are diminished in size or completely resolved, consistent with treatment response.   Assessment & Plan Malignant neoplasm of lower-inner quadrant of right breast Treatment effective with minor gastrointestinal issues. PET scan shows improvement. Benign lipoma identified. Guardian test indicates tumor reduction. - Continue current treatment regimen. - Review PET scan results from radiology.  Hematologic toxicity due to Enhertu  treatment Hematologic parameters stable. No dose adjustments needed. - Administer Neulasta  Onpro for white blood cell  support.      No orders of the defined types were placed in this encounter.  The patient has a good understanding of the overall plan. she agrees with it. she will call with any problems that may develop before the next visit here. Total time spent: 30 mins including face to face time and time spent for planning, charting and co-ordination of care   Naomi MARLA Chad, MD 05/31/24

## 2024-05-31 NOTE — Patient Instructions (Signed)
 [facility] - A DEPT OF [facility]  Discharge Instructions: Thank you for choosing [facility] to provide your oncology and hematology care.   If you have a lab appointment with the Cancer Center, please go directly to the Cancer Center and check in at the registration area.   Wear comfortable clothing and clothing appropriate for easy access to any Portacath or PICC line.   We strive to give you quality time with your provider. You may need to reschedule your appointment if you arrive late (15 or more minutes).  Arriving late affects you and other patients whose appointments are after yours.  Also, if you miss three or more appointments without notifying the office, you may be dismissed from the clinic at the provider's discretion.      For prescription refill requests, have your pharmacy contact our office and allow 72 hours for refills to be completed.    Today you received the following chemotherapy and/or immunotherapy agent: Fam-Trastuzumab  (Enhertu )   To help prevent nausea and vomiting after your treatment, we encourage you to take your nausea medication as directed.  BELOW ARE SYMPTOMS THAT SHOULD BE REPORTED IMMEDIATELY: *FEVER GREATER THAN 100.4 F (38 C) OR HIGHER *CHILLS OR SWEATING *NAUSEA AND VOMITING THAT IS NOT CONTROLLED WITH YOUR NAUSEA MEDICATION *UNUSUAL SHORTNESS OF BREATH *UNUSUAL BRUISING OR BLEEDING *URINARY PROBLEMS (pain or burning when urinating, or frequent urination) *BOWEL PROBLEMS (unusual diarrhea, constipation, pain near the anus) TENDERNESS IN MOUTH AND THROAT WITH OR WITHOUT PRESENCE OF ULCERS (sore throat, sores in mouth, or a toothache) UNUSUAL RASH, SWELLING OR PAIN  UNUSUAL VAGINAL DISCHARGE OR ITCHING   Items with * indicate a potential emergency and should be followed up as soon as possible or go to the Emergency Department if any problems should occur.  Please show the CHEMOTHERAPY ALERT CARD or  IMMUNOTHERAPY ALERT CARD at check-in to the Emergency Department and triage nurse.  Should you have questions after your visit or need to cancel or reschedule your appointment, please contact [facility] - A DEPT OF [facility]  Dept: [phone]  and follow the prompts.  Office hours are 8:00 a.m. to 4:30 p.m. Monday - Friday. Please note that voicemails left after 4:00 p.m. may not be returned until the following business day.  We are closed weekends and major holidays. You have access to a nurse at all times for urgent questions. Please call the main number to the clinic Dept: [phone] and follow the prompts.   For any non-urgent questions, you may also contact your provider using MyChart. We now offer e-Visits for anyone 60 and older to request care online for non-urgent symptoms. For details visit [email or web address].   Also download the MyChart app! Go to the app store, search MyChart, open the app, select [facility], and log in with your MyChart username and password.  Pegfilgrastim  Injection What is this medication? PEGFILGRASTIM  (PEG fil gra stim) lowers the risk of infection in people who are receiving chemotherapy. It works by Systems analyst make more white blood cells, which protects your body from infection. It may also be used to help people who have been exposed to high doses of radiation. This medicine may be used for other purposes; ask your health care provider or pharmacist if you have questions. COMMON BRAND NAME(S): Fulphila , Fylnetra , Neulasta , Nyvepria , Stimufend , UDENYCA , UDENYCA  ONBODY, Ziextenzo  What should I tell my care team  before I take this medication? They need to know if you have any of these conditions: Kidney disease Latex allergy Ongoing radiation therapy Sickle cell disease Skin reactions to acrylic adhesives (On-Body Injector only) An unusual or allergic reaction to pegfilgrastim , filgrastim , other medications,  foods, dyes, or preservatives Pregnant or trying to get pregnant Breast-feeding How should I use this medication? This medication is for injection under the skin. If you get this medication at home, you will be taught how to prepare and give the pre-filled syringe or how to use the On-body Injector. Refer to the patient Instructions for Use for detailed instructions. Use exactly as directed. Tell your care team immediately if you suspect that the On-body Injector may not have performed as intended or if you suspect the use of the On-body Injector resulted in a missed or partial dose. It is important that you put your used needles and syringes in a special sharps container. Do not put them in a trash can. If you do not have a sharps container, call your pharmacist or care team to get one. Talk to your care team about the use of this medication in children. While this medication may be prescribed for selected conditions, precautions do apply. Overdosage: If you think you have taken too much of this medicine contact a poison control center or emergency room at once. NOTE: This medicine is only for you. Do not share this medicine with others. What if I miss a dose? It is important not to miss your dose. Call your care team if you miss your dose. If you miss a dose due to an On-body Injector failure or leakage, a new dose should be administered as soon as possible using a single prefilled syringe for manual use. What may interact with this medication? Interactions have not been studied. This list may not describe all possible interactions. Give your health care provider a list of all the medicines, herbs, non-prescription drugs, or dietary supplements you use. Also tell them if you smoke, drink alcohol, or use illegal drugs. Some items may interact with your medicine. What should I watch for while using this medication? Your condition will be monitored carefully while you are receiving this  medication. You may need blood work done while you are taking this medication. Talk to your care team about your risk of cancer. You may be more at risk for certain types of cancer if you take this medication. If you are going to need a MRI, CT scan, or other procedure, tell your care team that you are using this medication (On-Body Injector only). What side effects may I notice from receiving this medication? Side effects that you should report to your care team as soon as possible: Allergic reactions--skin rash, itching, hives, swelling of the face, lips, tongue, or throat Capillary leak syndrome--stomach or muscle pain, unusual weakness or fatigue, feeling faint or lightheaded, decrease in the amount of urine, swelling of the ankles, hands, or feet, trouble breathing High white blood cell level--fever, fatigue, trouble breathing, night sweats, change in vision, weight loss Inflammation of the aorta--fever, fatigue, back, chest, or stomach pain, severe headache Kidney injury (glomerulonephritis)--decrease in the amount of urine, red or dark brown urine, foamy or bubbly urine, swelling of the ankles, hands, or feet Shortness of breath or trouble breathing Spleen injury--pain in upper left stomach or shoulder Unusual bruising or bleeding Side effects that usually do not require medical attention (report to your care team if they continue or are bothersome): Bone  pain Pain in the hands or feet This list may not describe all possible side effects. Call your doctor for medical advice about side effects. You may report side effects to FDA at 1-800-FDA-1088. Where should I keep my medication? Keep out of the reach of children. If you are using this medication at home, you will be instructed on how to store it. Throw away any unused medication after the expiration date on the label. NOTE: This sheet is a summary. It may not cover all possible information. If you have questions about this medicine,  talk to your doctor, pharmacist, or health care provider.  2024 Elsevier/Gold Standard (2021-08-21 00:00:00)

## 2024-06-01 ENCOUNTER — Encounter: Payer: Self-pay | Admitting: Hematology and Oncology

## 2024-06-18 ENCOUNTER — Other Ambulatory Visit: Payer: Self-pay | Admitting: *Deleted

## 2024-06-18 DIAGNOSIS — C50311 Malignant neoplasm of lower-inner quadrant of right female breast: Secondary | ICD-10-CM

## 2024-06-21 ENCOUNTER — Encounter: Payer: Self-pay | Admitting: Adult Health

## 2024-06-21 ENCOUNTER — Inpatient Hospital Stay (HOSPITAL_BASED_OUTPATIENT_CLINIC_OR_DEPARTMENT_OTHER): Admitting: Adult Health

## 2024-06-21 ENCOUNTER — Other Ambulatory Visit: Payer: Self-pay | Admitting: Hematology and Oncology

## 2024-06-21 ENCOUNTER — Other Ambulatory Visit: Payer: Self-pay | Admitting: *Deleted

## 2024-06-21 ENCOUNTER — Inpatient Hospital Stay: Attending: Hematology and Oncology

## 2024-06-21 ENCOUNTER — Inpatient Hospital Stay

## 2024-06-21 VITALS — BP 112/68 | HR 75 | Temp 97.7°F | Resp 16 | Wt 145.6 lb

## 2024-06-21 DIAGNOSIS — C7800 Secondary malignant neoplasm of unspecified lung: Secondary | ICD-10-CM | POA: Diagnosis not present

## 2024-06-21 DIAGNOSIS — C50311 Malignant neoplasm of lower-inner quadrant of right female breast: Secondary | ICD-10-CM

## 2024-06-21 DIAGNOSIS — Z9013 Acquired absence of bilateral breasts and nipples: Secondary | ICD-10-CM | POA: Insufficient documentation

## 2024-06-21 DIAGNOSIS — Z79899 Other long term (current) drug therapy: Secondary | ICD-10-CM | POA: Insufficient documentation

## 2024-06-21 DIAGNOSIS — Z171 Estrogen receptor negative status [ER-]: Secondary | ICD-10-CM | POA: Diagnosis not present

## 2024-06-21 DIAGNOSIS — Z5112 Encounter for antineoplastic immunotherapy: Secondary | ICD-10-CM | POA: Diagnosis present

## 2024-06-21 DIAGNOSIS — Z95828 Presence of other vascular implants and grafts: Secondary | ICD-10-CM | POA: Diagnosis not present

## 2024-06-21 LAB — CMP (CANCER CENTER ONLY)
ALT: 12 U/L (ref 0–44)
AST: 20 U/L (ref 15–41)
Albumin: 4.1 g/dL (ref 3.5–5.0)
Alkaline Phosphatase: 84 U/L (ref 38–126)
Anion gap: 4 — ABNORMAL LOW (ref 5–15)
BUN: 11 mg/dL (ref 6–20)
CO2: 28 mmol/L (ref 22–32)
Calcium: 8.9 mg/dL (ref 8.9–10.3)
Chloride: 108 mmol/L (ref 98–111)
Creatinine: 0.55 mg/dL (ref 0.44–1.00)
GFR, Estimated: >60 mL/min (ref >60)
Glucose, Bld: 106 mg/dL — ABNORMAL HIGH (ref 70–99)
Potassium: 3.5 mmol/L (ref 3.5–5.1)
Sodium: 140 mmol/L (ref 135–145)
Total Bilirubin: 0.4 mg/dL (ref 0.0–1.2)
Total Protein: 6.8 g/dL (ref 6.5–8.1)

## 2024-06-21 LAB — CBC WITH DIFFERENTIAL (CANCER CENTER ONLY)
Abs Immature Granulocytes: 0.01 K/uL (ref 0.00–0.07)
Basophils Absolute: 0 K/uL (ref 0.0–0.1)
Basophils Relative: 1 %
Eosinophils Absolute: 0.8 K/uL — ABNORMAL HIGH (ref 0.0–0.5)
Eosinophils Relative: 20 %
HCT: 30.9 % — ABNORMAL LOW (ref 36.0–46.0)
Hemoglobin: 10.7 g/dL — ABNORMAL LOW (ref 12.0–15.0)
Immature Granulocytes: 0 %
Lymphocytes Relative: 19 %
Lymphs Abs: 0.8 K/uL (ref 0.7–4.0)
MCH: 32.5 pg (ref 26.0–34.0)
MCHC: 34.6 g/dL (ref 30.0–36.0)
MCV: 93.9 fL (ref 80.0–100.0)
Monocytes Absolute: 0.5 K/uL (ref 0.1–1.0)
Monocytes Relative: 12 %
Neutro Abs: 2 K/uL (ref 1.7–7.7)
Neutrophils Relative %: 48 %
Platelet Count: 205 K/uL (ref 150–400)
RBC: 3.29 MIL/uL — ABNORMAL LOW (ref 3.87–5.11)
RDW: 14.9 % (ref 11.5–15.5)
WBC Count: 4.1 K/uL (ref 4.0–10.5)
nRBC: 0 % (ref 0.0–0.2)

## 2024-06-21 MED ORDER — DEXAMETHASONE SODIUM PHOSPHATE 10 MG/ML IJ SOLN
10.0000 mg | Freq: Once | INTRAMUSCULAR | Status: AC
  Administered 2024-06-21: 10 mg via INTRAVENOUS
  Filled 2024-06-21: qty 1

## 2024-06-21 MED ORDER — DEXTROSE 5 % IV SOLN: INTRAVENOUS | Status: DC

## 2024-06-21 MED ORDER — DIPHENHYDRAMINE HCL 25 MG PO CAPS
25.0000 mg | ORAL_CAPSULE | Freq: Once | ORAL | Status: AC
  Administered 2024-06-21: 25 mg via ORAL
  Filled 2024-06-21: qty 1

## 2024-06-21 MED ORDER — FAM-TRASTUZUMAB DERUXTECAN-NXKI CHEMO 100 MG IV SOLR
4.4000 mg/kg | Freq: Once | INTRAVENOUS | Status: AC
  Administered 2024-06-21: 300 mg via INTRAVENOUS
  Filled 2024-06-21: qty 15

## 2024-06-21 MED ORDER — PEGFILGRASTIM 6 MG/0.6ML ~~LOC~~ PSKT
6.0000 mg | PREFILLED_SYRINGE | Freq: Once | SUBCUTANEOUS | Status: AC
  Administered 2024-06-21: 6 mg via SUBCUTANEOUS
  Filled 2024-06-21: qty 0.6

## 2024-06-21 MED ORDER — PALONOSETRON HCL INJECTION 0.25 MG/5ML
0.2500 mg | Freq: Once | INTRAVENOUS | Status: AC
  Administered 2024-06-21: 0.25 mg via INTRAVENOUS
  Filled 2024-06-21: qty 5

## 2024-06-21 MED ORDER — SODIUM CHLORIDE 0.9% FLUSH: 10.0000 mL | INTRAVENOUS | Status: DC | PRN

## 2024-06-21 MED ORDER — APREPITANT 130 MG/18ML IV EMUL
130.0000 mg | Freq: Once | INTRAVENOUS | Status: AC
  Administered 2024-06-21: 130 mg via INTRAVENOUS
  Filled 2024-06-21: qty 18

## 2024-06-21 MED ORDER — ACETAMINOPHEN 325 MG PO TABS
650.0000 mg | ORAL_TABLET | Freq: Once | ORAL | Status: AC
  Administered 2024-06-21: 650 mg via ORAL
  Filled 2024-06-21: qty 2

## 2024-06-21 NOTE — Progress Notes (Signed)
 [facility] Cancer Follow up:    Suzanne Potts, MD [address] [place] [address]   DIAGNOSIS:  Cancer Staging  Malignant neoplasm of lower-inner quadrant of right breast of female, estrogen receptor negative (HCC) Staging form: Breast, AJCC 8th Edition - Clinical: Stage IIIC (cT2, cN3c, cM0, G3, ER-, PR-, HER2-) - Unsigned Histologic grading system: 3 grade system    SUMMARY OF ONCOLOGIC HISTORY: Oncology History  Malignant neoplasm of lower-inner quadrant of right breast of female, estrogen receptor negative (HCC)  01/10/2023 Initial Diagnosis   Palpable lump right axilla x 2 weeks bulky axillary lymphadenopathy ultrasound breast: 2 masses at 4 o'clock position 1.1 cm and 0.6 cm multiple axillary lymph nodes largest 4.5 cm also level 2 and 3 (subpectoral lymph node as well): Biopsy: Grade 3 IDC ER 0%, PR 0%, Ki-67 95%, HER2 1+   01/26/2023 Genetic Testing   Negative [employer] Custom Panel (37 genes).  Report date is 01/26/2023.    The [employer] Custom Cancers + RNA Panel includes sequencing, deletion/duplication, and RNA analysis of the following 37 genes: APC, ATM, AXIN2, BARD1, BMPR1A, BRCA1, BRCA2, BRIP1, CDH1, CDK4*, CDKN2A (p14ARF)*, CDKN2A (p16INK4a), CHEK2, CTNNA1, DICER1, EPCAM*, FH, GREM1*, HOXB13*, MBD4*, MLH1, MSH2, MSH3, MSH6, MUTYH, NF1, NTHL1, PALB2, PMS2, POLD1, POLE, PTEN, RAD51C, RAD51D, SMAD4, SMARCA4, STK11, TP53.  *Genes without RNA analysis.    01/28/2023 - 06/27/2023 Chemotherapy   Patient is on Treatment Plan : BREAST Pembrolizumab  (200) D1 + Carboplatin  (1.5) D1,8,15 + Paclitaxel  (80) D1,8,15 q21d X 4 cycles / Pembrolizumab  (200) D1 + AC D1 q21d x 4 cycles     07/15/2023 - 12/29/2023 Chemotherapy   Patient is on Treatment Plan : BREAST Pembrolizumab  (200) q21d x 27 weeks     09/07/2023 - 10/26/2023 Radiation Therapy   First Treatment Date: 2023-09-07 Last Treatment Date: 2023-10-26   Plan Name: CW_R_BO Site: Chest Wall,  Right Technique: 3D Mode: Photon Dose Per Fraction: 1.8 Gy Prescribed Dose (Delivered / Prescribed): 25.2 Gy / 25.2 Gy Prescribed Fxs (Delivered / Prescribed): 14 / 14   Plan Name: CW_R_Neck_Ax Site: Chest Wall, Right Technique: 3D Mode: Photon Dose Per Fraction: 1.8 Gy Prescribed Dose (Delivered / Prescribed): 50.4 Gy / 50.4 Gy Prescribed Fxs (Delivered / Prescribed): 28 / 28   Plan Name: CW_R_Bst Site: Chest Wall, Right Technique: 3D Mode: Photon Dose Per Fraction: 1.8 Gy Prescribed Dose (Delivered / Prescribed): 9 Gy / 9 Gy Prescribed Fxs (Delivered / Prescribed): 5 / 5   Plan Name: CW_R Site: Chest Wall, Right Technique: 3D Mode: Photon Dose Per Fraction: 1.8 Gy Prescribed Dose (Delivered / Prescribed): 25.2 Gy / 25.2 Gy Prescribed Fxs (Delivered / Prescribed): 14 / 14     03/28/2024 -  Chemotherapy   Patient is on Treatment Plan : BREAST Fam-Trastuzumab Deruxtecan-nxki  (Enhertu ) (5.4) q21d       CURRENT THERAPY: Enhertu   INTERVAL HISTORY:  Discussed the use of AI scribe software for clinical note transcription with the patient, who gave verbal consent to proceed.  History of Present Illness Suzanne Tucker is a 43 year old female with triple negative breast cancer who presents for follow-up and evaluation prior to receiving Enhertu .  Recent restaging scans on August 13th, 2025, show multiple pulmonary nodules in both lung apices either diminished or completely resolved.  She experiences palpitations on three occasions when walking quickly, particularly on days with caffeine intake. These episodes include significant palpitations and dyspnea, resolving spontaneously. She acknowledges inadequate hydration on those days.  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) 05/03/2023   Antineoplastic chemotherapy induced pancytopenia (HCC) 05/02/2023   Genetic testing 02/08/2023   Port-A-Cath in place 01/28/2023   Malignant neoplasm of lower-inner quadrant of right breast of female, estrogen receptor negative (HCC) 01/17/2023   Axillary mass, right 12/28/2022   Palpitations 11/23/2022   Dizzy spells 03/04/2022   Carpal tunnel syndrome on right 11/02/2017   MVP (mitral valve prolapse) 10/28/2017    is allergic to antihistamines, loratadine-type; kiwi extract; morphine; oxycontin  [oxycodone ]; tape; tessalon [benzonatate]; tramadol ; codeine; hydrocodone; and latex.  MEDICAL HISTORY: Past Medical History:  Diagnosis Date   Anginal pain (HCC) 02/2019   related to MVP   Back pain    Breast cancer (HCC)    Depression    Dysrhythmia    MVP   Mitral prolapse    PONV (postoperative nausea and vomiting)    Sleep apnea    mild no cpap indicated    SURGICAL HISTORY: Past Surgical History:  Procedure Laterality Date   BREAST SURGERY Right 2024   Breast biopsy x 2   BRONCHIAL BIOPSY  03/19/2024   Procedure: BRONCHOSCOPY, WITH BIOPSY;  Surgeon: Shelah Lamar RAMAN, MD;  Location: [facility];  Service: Pulmonary;;   BRONCHIAL BRUSHINGS  03/19/2024   Procedure: BRONCHOSCOPY, WITH BRUSH BIOPSY;  Surgeon: Shelah Lamar RAMAN, MD;  Location: [facility];  Service: Pulmonary;;   BRONCHIAL NEEDLE ASPIRATION BIOPSY  03/19/2024   Procedure: BRONCHOSCOPY, WITH NEEDLE ASPIRATION BIOPSY;  Surgeon: Shelah Lamar RAMAN, MD;  Location: [facility];  Service: Pulmonary;;   BRONCHOSCOPY, WITH BIOPSY USING ELECTROMAGNETIC NAVIGATION Bilateral 03/19/2024   Procedure: BRONCHOSCOPY, WITH BIOPSY USING ELECTROMAGNETIC NAVIGATION;  Surgeon: Shelah Lamar RAMAN, MD;  Location: [facility];  Service: Pulmonary;  Laterality: Bilateral;  WITH FLURO   CESAREAN SECTION  03/30/2008   MASTECTOMY WITH AXILLARY LYMPH NODE DISSECTION Bilateral  07/27/2023   Procedure: BILATERAL MASTECTOMY WITH RIGHT AXILLARY LYMPH NODE DISSECTION;  Surgeon: Aron Shoulders, MD;  Location: [facility];  Service: General;  Laterality: Bilateral;  PEC BLOCK   PORT A CATH REVISION Right 05/23/2024   Procedure: REVISION, PORT-A-CATH INSERTION;  Surgeon: Aron Shoulders, MD;  Location: [facility];  Service: General;  Laterality: Right;   PORT-A-CATH REMOVAL Left 01/31/2024   Procedure: REMOVAL PORT-A-CATH;  Surgeon: Aron Shoulders, MD;  Location: [facility];  Service: General;  Laterality: Left;   PORTACATH PLACEMENT N/A 01/27/2023   Procedure: INSERTION PORT-A-CATH;  Surgeon: Aron Shoulders, MD;  Location: [facility];  Service: General;  Laterality: N/A;  leaving accessed   PORTACATH PLACEMENT Left 03/27/2024   Procedure: INSERTION, TUNNELED CENTRAL VENOUS DEVICE, WITH PORT;  Surgeon: Aron Shoulders, MD;  Location: [facility];  Service: General;  Laterality: Left;   ROBOTIC ASSISTED TOTAL HYSTERECTOMY Bilateral 03/15/2019   Procedure: XI ROBOTIC ASSISTED TOTAL HYSTERECTOMY with Bilateral Salpingectomy;  Surgeon: Darcel Pool, MD;  Location[facility];  Service: Gynecology;  Laterality: Bilateral;   TUBAL LIGATION  2017    SOCIAL HISTORY: Social History   Socioeconomic History   Marital status: Married    Spouse name: Not on file   Number of children: Not on file   Years of education: Not on file   Highest education level: Master's degree (e.g., MA,  MS, MEng, MEd, MSW, MBA)  Occupational History   Not on file  Tobacco Use   Smoking status: Never   Smokeless tobacco: Never  Vaping Use   Vaping status: Never Used  Substance and Sexual Activity   Alcohol use: Not Currently    Comment:     Drug use: No   Sexual activity: Yes    Birth control/protection: Surgical  Other Topics Concern   Not on file  Social History Narrative   Not on file   Social Drivers of Health   Financial Resource Strain: Low  Risk  (12/27/2022)   Overall Financial Resource Strain (CARDIA)    Difficulty of Paying Living Expenses: Not very hard  Food Insecurity: No Food Insecurity (08/24/2023)   Hunger Vital Sign    Worried About Running Out of Food in the Last Year: Never true    Ran Out of Food in the Last Year: Never true  Transportation Needs: No Transportation Needs (08/24/2023)   PRAPARE - Administrator, Civil Service (Medical): No    Lack of Transportation (Non-Medical): No  Physical Activity: Insufficiently Active (12/27/2022)   Exercise Vital Sign    Days of Exercise per Week: 4 days    Minutes of Exercise per Session: 20 min  Stress: No Stress Concern Present (12/27/2022)   [employer] of Occupational Health - Occupational Stress Questionnaire    Feeling of Stress : Only a little  Social Connections: Moderately Isolated (12/27/2022)   Social Connection and Isolation Panel    Frequency of Communication with Friends and Family: More than three times a week    Frequency of Social Gatherings with Friends and Family: Once a week    Attends Religious Services: Never    Database administrator or Organizations: No    Attends Engineer, structural: Not on file    Marital Status: Married  Catering manager Violence: Not At Risk (08/24/2023)   Humiliation, Afraid, Rape, and Kick questionnaire    Fear of Current or Ex-Partner: No    Emotionally Abused: No    Physically Abused: No    Sexually Abused: No    FAMILY HISTORY: Family History  Problem Relation Age of Onset   Stroke Mother    Alzheimer's disease Father    Diabetes Maternal Grandmother    Heart disease Maternal Grandmother    Heart attack Maternal Grandmother    Diabetes Maternal Grandfather    Heart disease Maternal Grandfather    Alzheimer's disease Paternal Grandmother    Dementia Paternal Grandmother    Alzheimer's disease Paternal Grandfather    Dementia Paternal Grandfather    Alcohol abuse Paternal  Grandfather     Review of Systems  Constitutional:  Negative for appetite change, chills, fatigue, fever and unexpected weight change.  HENT:   Negative for hearing loss, lump/mass and trouble swallowing.   Eyes:  Negative for eye problems and icterus.  Respiratory:  Negative for chest tightness, cough and shortness of breath.   Cardiovascular:  Negative for chest pain, leg swelling and palpitations.  Gastrointestinal:  Negative for abdominal distention, abdominal pain, constipation, diarrhea, nausea and vomiting.  Endocrine: Negative for hot flashes.  Genitourinary:  Negative for difficulty urinating.   Musculoskeletal:  Negative for arthralgias.  Skin:  Negative for itching and rash.  Neurological:  Negative for dizziness, extremity weakness, headaches and numbness.  Hematological:  Negative for adenopathy. Does not bruise/bleed easily.  Psychiatric/Behavioral:  Negative for depression. The patient is not nervous/anxious.  PHYSICAL EXAMINATION   Onc Performance Status - 06/21/24 1359       ECOG Perf Status   ECOG Perf Status Fully active, able to carry on all pre-disease performance without restriction      KPS SCALE   KPS % SCORE Able to carry on normal activity, minor s/s of disease          Vitals:   06/21/24 1356  BP: 112/68  Pulse: 75  Resp: 16  Temp: 97.7 F (36.5 C)  SpO2: 100%    Physical Exam Constitutional:      General: She is not in acute distress.    Appearance: Normal appearance. She is not toxic-appearing.  HENT:     Head: Normocephalic and atraumatic.     Mouth/Throat:     Mouth: Mucous membranes are moist.     Pharynx: Oropharynx is clear. No oropharyngeal exudate or posterior oropharyngeal erythema.  Eyes:     General: No scleral icterus. Cardiovascular:     Rate and Rhythm: Normal rate and regular rhythm.     Pulses: Normal pulses.     Heart sounds: Normal heart sounds.  Pulmonary:     Effort: Pulmonary effort is normal.      Breath sounds: Normal breath sounds.  Abdominal:     General: Abdomen is flat. Bowel sounds are normal. There is no distension.     Palpations: Abdomen is soft.     Tenderness: There is no abdominal tenderness.  Musculoskeletal:        General: No swelling.     Cervical back: Neck supple.  Lymphadenopathy:     Cervical: No cervical adenopathy.  Skin:    General: Skin is warm and dry.     Findings: No rash.  Neurological:     General: No focal deficit present.     Mental Status: She is alert.  Psychiatric:        Mood and Affect: Mood normal.        Behavior: Behavior normal.     LABORATORY DATA:  CBC    Component Value Date/Time   WBC 4.1 06/21/2024 1311   WBC 10.0 08/11/2023 0353   RBC 3.29 (L) 06/21/2024 1311   HGB 10.7 (L) 06/21/2024 1311   HGB 11.6 04/08/2022 1037   HCT 30.9 (L) 06/21/2024 1311   HCT 35.8 04/08/2022 1037   PLT 205 06/21/2024 1311   PLT 244 04/08/2022 1037   MCV 93.9 06/21/2024 1311   MCV 89 04/08/2022 1037   MCH 32.5 06/21/2024 1311   MCHC 34.6 06/21/2024 1311   RDW 14.9 06/21/2024 1311   RDW 12.1 04/08/2022 1037   LYMPHSABS 0.8 06/21/2024 1311   LYMPHSABS 1.3 04/08/2022 1037   MONOABS 0.5 06/21/2024 1311   EOSABS 0.8 (H) 06/21/2024 1311   EOSABS 0.1 04/08/2022 1037   BASOSABS 0.0 06/21/2024 1311   BASOSABS 0.0 04/08/2022 1037    CMP     Component Value Date/Time   NA 140 05/31/2024 0958   NA 140 04/08/2022 1037   K 3.7 05/31/2024 0958   CL 107 05/31/2024 0958   CO2 29 05/31/2024 0958   GLUCOSE 105 (H) 05/31/2024 0958   BUN 11 05/31/2024 0958   BUN 9 04/08/2022 1037   CREATININE 0.55 05/31/2024 0958   CALCIUM  9.1 05/31/2024 0958   PROT 6.5 05/31/2024 0958   PROT 6.5 04/08/2022 1037   ALBUMIN 4.1 05/31/2024 0958   ALBUMIN 4.3 04/08/2022 1037   AST 22 05/31/2024 0958   ALT 16  05/31/2024 0958   ALKPHOS 75 05/31/2024 0958   BILITOT 0.5 05/31/2024 0958   GFRNONAA >60 05/31/2024 0958   GFRAA 132 11/04/2020 1208     ASSESSMENT  and THERAPY PLAN:   Malignant neoplasm of lower-inner quadrant of right breast of female, estrogen receptor negative (HCC) 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%, HER2 1+  01/25/2023: PET/CT: Right breast cancer, right axilla, retropectoral and right cervical lymph nodes   Treatment plan: Neoadjuvant chemotherapy with Taxol  carbo Keytruda  weekly x 12 followed by Adriamycin  Cytoxan  Keytruda  followed by Keytruda  maintenance until 12/30/2023 07/27/2023: Bilateral mastectomies: Left mastectomy: Benign, right mastectomy: No evidence of residual cancer, 8/20 lymph nodes positive with extranodal extension in 2 lymph nodes Adjuvant radiation therapy 09/08/2023-10/24/2022 Capecitabine  (completed June 2025) with Keytruda  (completed 12/29/2023) --------------------------------------------------------------------------------------- 03/09/2024: PET/CT: 4 mm node right axilla SUV 3.7, right subpectoral activity SUV 3.4 (possible 5 mm node) right apical 7 mm nodule is new compared to 12/23/2023 with an SUV of 2.3, right upper lobe 1 cm nodule new with a SUV of 3.6, left upper lobe nodule 7 mm SUV 4.2, soft tissue fullness proximal duodenum SUV 8.7 (possible duodenitis)   EGD: Bx: Duodenitis Lung FNA: malignant cells consistent with breast primary (triple negative) Caris molecular testing: T p53, TMB 10.3 Brain MRI: Neg   Treatment plan: Enhertu  started 03/28/24    Assessment & Plan 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 Recent CT scans from May 16, 2024, indicate diminished or resolved pulmonary nodules in both lung apices and no lymphadenopathy. Preparing for Enhertu   treatment. Discussed the importance of hydration, especially due to treatment effects, and the impact of caffeine on hydration status. - Administer Enhertu  as planned. - Encourage increased water  intake, especially on treatment days. - Advise obtaining flu shot a week before treatment, available at [facility] or similar clinics.  Depression Reports a recent episode of depression lasting four days, which is atypical for her. Discussed potential triggers and coping strategies, including journaling and identifying positive events. Suggested exploring underlying issues that may contribute to depressive symptoms. - Encourage journaling to explore feelings and identify positive events. - Advise engaging in activities like walking to alleviate depressive symptoms. - Consider reducing social media use if it contributes to negative emotions.  RTC in 3 weeks for labs, f/u, and her next appointment  All questions were answered. The patient knows to call the clinic with any problems, questions or concerns. We can certainly see the patient much sooner if necessary.  Total encounter time:30 minutes*in face-to-face visit time, chart review, lab review, care coordination, order entry, and documentation of the encounter time.    Morna Kendall, NP 06/21/24 2:23 PM Medical Oncology and Hematology [facility] [address] [place], [place] [address] Tel. [phone]    Fax. [phone]  *Total Encounter Time as defined by the Centers for Medicare and Medicaid Services includes, in addition to the face-to-face time of a patient visit (documented in the note above) non-face-to-face time: obtaining and reviewing outside history, ordering and reviewing medications, tests or procedures, care coordination (communications with other health care professionals or caregivers) and documentation in the medical record.

## 2024-06-21 NOTE — Patient Instructions (Signed)
 [facility] - A DEPT OF [facility]  Discharge Instructions: Thank you for choosing [facility] to provide your oncology and hematology care.   If you have a lab appointment with the Cancer Center, please go directly to the Cancer Center and check in at the registration area.   Wear comfortable clothing and clothing appropriate for easy access to any Portacath or PICC line.   We strive to give you quality time with your provider. You may need to reschedule your appointment if you arrive late (15 or more minutes).  Arriving late affects you and other patients whose appointments are after yours.  Also, if you miss three or more appointments without notifying the office, you may be dismissed from the clinic at the provider's discretion.      For prescription refill requests, have your pharmacy contact our office and allow 72 hours for refills to be completed.    Today you received the following chemotherapy and/or immunotherapy agents enhertu  and neulasta  onpro      To help prevent nausea and vomiting after your treatment, we encourage you to take your nausea medication as directed.  BELOW ARE SYMPTOMS THAT SHOULD BE REPORTED IMMEDIATELY: *FEVER GREATER THAN 100.4 F (38 C) OR HIGHER *CHILLS OR SWEATING *NAUSEA AND VOMITING THAT IS NOT CONTROLLED WITH YOUR NAUSEA MEDICATION *UNUSUAL SHORTNESS OF BREATH *UNUSUAL BRUISING OR BLEEDING *URINARY PROBLEMS (pain or burning when urinating, or frequent urination) *BOWEL PROBLEMS (unusual diarrhea, constipation, pain near the anus) TENDERNESS IN MOUTH AND THROAT WITH OR WITHOUT PRESENCE OF ULCERS (sore throat, sores in mouth, or a toothache) UNUSUAL RASH, SWELLING OR PAIN  UNUSUAL VAGINAL DISCHARGE OR ITCHING   Items with * indicate a potential emergency and should be followed up as soon as possible or go to the Emergency Department if any problems should occur.  Please show the CHEMOTHERAPY ALERT CARD  or IMMUNOTHERAPY ALERT CARD at check-in to the Emergency Department and triage nurse.  Should you have questions after your visit or need to cancel or reschedule your appointment, please contact [facility] - A DEPT OF [facility]  Dept: [phone]  and follow the prompts.  Office hours are 8:00 a.m. to 4:30 p.m. Monday - Friday. Please note that voicemails left after 4:00 p.m. may not be returned until the following business day.  We are closed weekends and major holidays. You have access to a nurse at all times for urgent questions. Please call the main number to the clinic Dept: [phone] and follow the prompts.   For any non-urgent questions, you may also contact your provider using MyChart. We now offer e-Visits for anyone 88 and older to request care online for non-urgent symptoms. For details visit [email or web address].   Also download the MyChart app! Go to the app store, search MyChart, open the app, select [facility], and log in with your MyChart username and password.

## 2024-06-21 NOTE — Telephone Encounter (Signed)
 Refilled Fluconazole  per last office note for thrush related to chemo treatment.  Cosigned requested.

## 2024-06-21 NOTE — Assessment & Plan Note (Signed)
 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%, HER2 1+  01/25/2023: PET/CT: Right breast cancer, right axilla, retropectoral and right cervical lymph nodes   Treatment plan: Neoadjuvant chemotherapy with Taxol  carbo Keytruda  weekly x 12 followed by Adriamycin  Cytoxan  Keytruda  followed by Keytruda  maintenance until 12/30/2023 07/27/2023: Bilateral mastectomies: Left mastectomy: Benign, right mastectomy: No evidence of residual cancer, 8/20 lymph nodes positive with extranodal extension in 2 lymph nodes Adjuvant radiation therapy 09/08/2023-10/24/2022 Capecitabine  (completed June 2025) with Keytruda  (completed 12/29/2023) --------------------------------------------------------------------------------------- 03/09/2024: PET/CT: 4 mm node right axilla SUV 3.7, right subpectoral activity SUV 3.4 (possible 5 mm node) right apical 7 mm nodule is new compared to 12/23/2023 with an SUV of 2.3, right upper lobe 1 cm nodule new with a SUV of 3.6, left upper lobe nodule 7 mm SUV 4.2, soft tissue fullness proximal duodenum SUV 8.7 (possible duodenitis)   EGD: Bx: Duodenitis Lung FNA: malignant cells consistent with breast primary (triple negative) Caris molecular testing: T p53, TMB 10.3 Brain MRI: Neg   Treatment plan: Enhertu  started 03/28/24

## 2024-06-23 ENCOUNTER — Other Ambulatory Visit: Payer: Self-pay

## 2024-06-24 ENCOUNTER — Other Ambulatory Visit: Payer: Self-pay

## 2024-06-25 ENCOUNTER — Ambulatory Visit

## 2024-06-25 ENCOUNTER — Encounter: Payer: Self-pay | Admitting: General Practice

## 2024-06-25 NOTE — Progress Notes (Signed)
 [facility] Spiritual Care Note  Followed up with Suzanne Tucker by phone. She was having a sick day with her family to care for her, so we limited the call. She reports doing ok overall, though juggling general adult responsibilities, parenting, and low energy/side effects from treatment is an ongoing challenge.  [name] welcomes continued follow-up calls and knows how to reach chaplain as needed/desired.  [address] [name], [place], [facility] Pager [phone] Voicemail [phone]

## 2024-06-27 ENCOUNTER — Encounter: Payer: Self-pay | Admitting: Hematology and Oncology

## 2024-06-27 NOTE — Therapy (Incomplete)
 OUTPATIENT PHYSICAL THERAPY SOZO SCREENING NOTE   Patient Name: Suzanne Tucker MRN: [id number] DOB:1981-03-28, 42 y.o., female Today's Date: 06/27/2024  PCP: Suzanne Potts, MD REFERRING PROVIDER: [name], MD    Past Medical History:  Diagnosis Date   Anginal pain 02/2019   related to MVP   Back pain    Breast cancer (HCC)    Depression    Dysrhythmia    MVP   Mitral prolapse    PONV (postoperative nausea and vomiting)    Sleep apnea    mild no cpap indicated   Past Surgical History:  Procedure Laterality Date   BREAST SURGERY Right 2024   Breast biopsy x 2   BRONCHIAL BIOPSY  03/19/2024   Procedure: BRONCHOSCOPY, WITH BIOPSY;  Surgeon: Shelah Lamar RAMAN, MD;  Location: [facility] ENDOSCOPY;  Service: Pulmonary;;   BRONCHIAL BRUSHINGS  03/19/2024   Procedure: BRONCHOSCOPY, WITH BRUSH BIOPSY;  Surgeon: Shelah Lamar RAMAN, MD;  Location: [facility] ENDOSCOPY;  Service: Pulmonary;;   BRONCHIAL NEEDLE ASPIRATION BIOPSY  03/19/2024   Procedure: BRONCHOSCOPY, WITH NEEDLE ASPIRATION BIOPSY;  Surgeon: Shelah Lamar RAMAN, MD;  Location: [facility] ENDOSCOPY;  Service: Pulmonary;;   BRONCHOSCOPY, WITH BIOPSY USING ELECTROMAGNETIC NAVIGATION Bilateral 03/19/2024   Procedure: BRONCHOSCOPY, WITH BIOPSY USING ELECTROMAGNETIC NAVIGATION;  Surgeon: Shelah Lamar RAMAN, MD;  Location: [facility] ENDOSCOPY;  Service: Pulmonary;  Laterality: Bilateral;  WITH FLURO   CESAREAN SECTION  03/30/2008   MASTECTOMY WITH AXILLARY LYMPH NODE DISSECTION Bilateral 07/27/2023   Procedure: BILATERAL MASTECTOMY WITH RIGHT AXILLARY LYMPH NODE DISSECTION;  Surgeon: Suzanne Shoulders, MD;  Location: [facility];  Service: General;  Laterality: Bilateral;  PEC BLOCK   PORT A CATH REVISION Right 05/23/2024   Procedure: REVISION, PORT-A-CATH INSERTION;  Surgeon: Suzanne Shoulders, MD;  Location: [facility] OR;  Service: General;  Laterality: Right;   PORT-A-CATH REMOVAL Left 01/31/2024   Procedure: REMOVAL PORT-A-CATH;  Surgeon: Suzanne Shoulders, MD;   Location: [facility];  Service: General;  Laterality: Left;   PORTACATH PLACEMENT N/A 01/27/2023   Procedure: INSERTION PORT-A-CATH;  Surgeon: Suzanne Shoulders, MD;  Location: [facility]S;  Service: General;  Laterality: N/A;  leaving accessed   PORTACATH PLACEMENT Left 03/27/2024   Procedure: INSERTION, TUNNELED CENTRAL VENOUS DEVICE, WITH PORT;  Surgeon: Suzanne Shoulders, MD;  Location: [facility];  Service: General;  Laterality: Left;   ROBOTIC ASSISTED TOTAL HYSTERECTOMY Bilateral 03/15/2019   Procedure: XI ROBOTIC ASSISTED TOTAL HYSTERECTOMY with Bilateral Salpingectomy;  Surgeon: Suzanne Pool, MD;  Location: [facility];  Service: Gynecology;  Laterality: Bilateral;   TUBAL LIGATION  2017   Patient Active Problem List   Diagnosis Date Noted   Pulmonary nodules 03/13/2024   Complication, postoperative infection 08/10/2023   Breast cancer of lower-inner quadrant of right female breast (HCC) 07/27/2023   Dysautonomia (HCC) 07/25/2023   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  PERTINENT HISTORY:  Patient was diagnosed on 01/07/2023 with right grade 3 invasive ductal carcinoma breast cancer. 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 She had a bilateral mastectomy and right axillary lymph node dissection (8 of 20 nodes positive) on 07/27/2023   PRECAUTIONS: right UE Lymphedema risk,   SUBJECTIVE:  PAIN:  Are you having pain? Yes, at the  lateral trunk. Suzanne Tucker just said its going to stay there probably  SOZO SCREENING: Patient was assessed today using the SOZO machine to determine the lymphedema index score. This was compared to her baseline score. It was determined that she is within the recommended range when compared to her baseline and no further action is needed at this time. She will continue SOZO screenings. These are done every 3 months for 2 years post operatively followed by every 6 months for 2 years, and then annually.   L-DEX FLOWSHEETS - 06/28/24 1500       L-DEX LYMPHEDEMA SCREENING   Measurement Type Unilateral    L-DEX MEASUREMENT EXTREMITY Upper Extremity    POSITION  Standing    DOMINANT SIDE Right    At Risk Side Right    BASELINE SCORE (UNILATERAL) 4.6    L-DEX SCORE (UNILATERAL) 0.3    VALUE CHANGE (UNILAT) -4.3           Suzanne Tucker, PT 06/27/2024, 6:00 PM

## 2024-06-28 ENCOUNTER — Ambulatory Visit: Attending: Hematology and Oncology

## 2024-06-28 DIAGNOSIS — Z483 Aftercare following surgery for neoplasm: Secondary | ICD-10-CM | POA: Insufficient documentation

## 2024-06-29 ENCOUNTER — Other Ambulatory Visit: Payer: Self-pay

## 2024-07-10 ENCOUNTER — Encounter: Payer: Self-pay | Admitting: Hematology and Oncology

## 2024-07-10 LAB — GUARDANT 360

## 2024-07-12 ENCOUNTER — Other Ambulatory Visit: Payer: Self-pay | Admitting: *Deleted

## 2024-07-12 ENCOUNTER — Encounter: Payer: Self-pay | Admitting: General Practice

## 2024-07-12 ENCOUNTER — Inpatient Hospital Stay

## 2024-07-12 ENCOUNTER — Inpatient Hospital Stay: Attending: Hematology and Oncology

## 2024-07-12 ENCOUNTER — Inpatient Hospital Stay: Admitting: Hematology and Oncology

## 2024-07-12 VITALS — BP 112/76 | HR 73 | Temp 98.0°F | Resp 20 | Wt 143.0 lb

## 2024-07-12 DIAGNOSIS — Z171 Estrogen receptor negative status [ER-]: Secondary | ICD-10-CM

## 2024-07-12 DIAGNOSIS — R222 Localized swelling, mass and lump, trunk: Secondary | ICD-10-CM | POA: Insufficient documentation

## 2024-07-12 DIAGNOSIS — Z5112 Encounter for antineoplastic immunotherapy: Secondary | ICD-10-CM | POA: Insufficient documentation

## 2024-07-12 DIAGNOSIS — Z9221 Personal history of antineoplastic chemotherapy: Secondary | ICD-10-CM | POA: Insufficient documentation

## 2024-07-12 DIAGNOSIS — C50311 Malignant neoplasm of lower-inner quadrant of right female breast: Secondary | ICD-10-CM | POA: Insufficient documentation

## 2024-07-12 DIAGNOSIS — Z79899 Other long term (current) drug therapy: Secondary | ICD-10-CM | POA: Diagnosis not present

## 2024-07-12 DIAGNOSIS — Z9013 Acquired absence of bilateral breasts and nipples: Secondary | ICD-10-CM | POA: Insufficient documentation

## 2024-07-12 LAB — CBC WITH DIFFERENTIAL (CANCER CENTER ONLY)
Abs Immature Granulocytes: 0.02 K/uL (ref 0.00–0.07)
Basophils Absolute: 0 K/uL (ref 0.0–0.1)
Basophils Relative: 1 %
Eosinophils Absolute: 0.3 K/uL (ref 0.0–0.5)
Eosinophils Relative: 6 %
HCT: 29 % — ABNORMAL LOW (ref 36.0–46.0)
Hemoglobin: 10.2 g/dL — ABNORMAL LOW (ref 12.0–15.0)
Immature Granulocytes: 1 %
Lymphocytes Relative: 21 %
Lymphs Abs: 0.9 K/uL (ref 0.7–4.0)
MCH: 32.7 pg (ref 26.0–34.0)
MCHC: 35.2 g/dL (ref 30.0–36.0)
MCV: 92.9 fL (ref 80.0–100.0)
Monocytes Absolute: 0.4 K/uL (ref 0.1–1.0)
Monocytes Relative: 9 %
Neutro Abs: 2.8 K/uL (ref 1.7–7.7)
Neutrophils Relative %: 62 %
Platelet Count: 176 K/uL (ref 150–400)
RBC: 3.12 MIL/uL — ABNORMAL LOW (ref 3.87–5.11)
RDW: 14.6 % (ref 11.5–15.5)
WBC Count: 4.4 K/uL (ref 4.0–10.5)
nRBC: 0 % (ref 0.0–0.2)

## 2024-07-12 LAB — CMP (CANCER CENTER ONLY)
ALT: 12 U/L (ref 0–44)
AST: 19 U/L (ref 15–41)
Albumin: 4.1 g/dL (ref 3.5–5.0)
Alkaline Phosphatase: 79 U/L (ref 38–126)
Anion gap: 6 (ref 5–15)
BUN: 12 mg/dL (ref 6–20)
CO2: 28 mmol/L (ref 22–32)
Calcium: 9.4 mg/dL (ref 8.9–10.3)
Chloride: 107 mmol/L (ref 98–111)
Creatinine: 0.55 mg/dL (ref 0.44–1.00)
GFR, Estimated: >60 mL/min (ref >60)
Glucose, Bld: 118 mg/dL — ABNORMAL HIGH (ref 70–99)
Potassium: 3.1 mmol/L — ABNORMAL LOW (ref 3.5–5.1)
Sodium: 141 mmol/L (ref 135–145)
Total Bilirubin: 0.5 mg/dL (ref 0.0–1.2)
Total Protein: 6.9 g/dL (ref 6.5–8.1)

## 2024-07-12 LAB — T4, FREE: Free T4: 0.81 ng/dL (ref 0.61–1.12)

## 2024-07-12 LAB — TSH: TSH: 1.2 u[IU]/mL (ref 0.350–4.500)

## 2024-07-12 MED ORDER — PEGFILGRASTIM INF DEV 6 MG/0.6ML ~~LOC~~ SOSY
6.0000 mg | PREFILLED_SYRINGE | Freq: Once | SUBCUTANEOUS | Status: AC
  Administered 2024-07-12: 6 mg via SUBCUTANEOUS
  Filled 2024-07-12: qty 0.6

## 2024-07-12 MED ORDER — FAM-TRASTUZUMAB DERUXTECAN-NXKI CHEMO 100 MG IV SOLR
4.4000 mg/kg | Freq: Once | INTRAVENOUS | Status: AC
  Administered 2024-07-12: 300 mg via INTRAVENOUS
  Filled 2024-07-12: qty 15

## 2024-07-12 MED ORDER — DEXAMETHASONE SOD PHOSPHATE PF 10 MG/ML IJ SOLN
10.0000 mg | Freq: Once | INTRAMUSCULAR | Status: AC
  Administered 2024-07-12: 10 mg via INTRAVENOUS

## 2024-07-12 MED ORDER — DIPHENHYDRAMINE HCL 25 MG PO CAPS
25.0000 mg | ORAL_CAPSULE | Freq: Once | ORAL | Status: AC
  Administered 2024-07-12: 25 mg via ORAL
  Filled 2024-07-12: qty 1

## 2024-07-12 MED ORDER — PALONOSETRON HCL INJECTION 0.25 MG/5ML
0.2500 mg | Freq: Once | INTRAVENOUS | Status: AC
  Administered 2024-07-12: 0.25 mg via INTRAVENOUS
  Filled 2024-07-12: qty 5

## 2024-07-12 MED ORDER — DEXTROSE 5 % IV SOLN: INTRAVENOUS | Status: DC

## 2024-07-12 MED ORDER — APREPITANT 130 MG/18ML IV EMUL
130.0000 mg | Freq: Once | INTRAVENOUS | Status: AC
  Administered 2024-07-12: 130 mg via INTRAVENOUS
  Filled 2024-07-12: qty 18

## 2024-07-12 MED ORDER — ACETAMINOPHEN 325 MG PO TABS
650.0000 mg | ORAL_TABLET | Freq: Once | ORAL | Status: AC
  Administered 2024-07-12: 650 mg via ORAL
  Filled 2024-07-12: qty 2

## 2024-07-12 NOTE — Progress Notes (Signed)
 Last Echo on 6/14, MD made made aware, already scheduled for next Echo on Monday.

## 2024-07-12 NOTE — Progress Notes (Signed)
 Patient Care Team: [name], MD as PCP - General (Hematology and Oncology) Dann Candyce RAMAN, MD as PCP - Cardiology (Cardiology) Fernande Elspeth BROCKS, MD as PCP - Electrophysiology (Cardiology) [name], MD as Consulting Physician (Hematology and Oncology) Izell Domino, MD as Attending Physician (Radiation Oncology) Aron Shoulders, MD as Consulting Physician (General Surgery) Tyree Nanetta SAILOR, RN as Oncology Nurse Navigator  DIAGNOSIS:  Encounter Diagnosis  Name Primary?   Malignant neoplasm of lower-inner quadrant of right breast of female, estrogen receptor negative (HCC) Yes    SUMMARY OF ONCOLOGIC HISTORY: Oncology History  Malignant neoplasm of lower-inner quadrant of right breast of female, estrogen receptor negative (HCC)  01/10/2023 Initial Diagnosis   Palpable lump right axilla x 2 weeks bulky axillary lymphadenopathy ultrasound breast: 2 masses at 4 o'clock position 1.1 cm and 0.6 cm multiple axillary lymph nodes largest 4.5 cm also level 2 and 3 (subpectoral lymph node as well): Biopsy: Grade 3 IDC ER 0%, PR 0%, Ki-67 95%, HER2 1+   01/26/2023 Genetic Testing   Negative [employer] Custom Panel (37 genes).  Report date is 01/26/2023.    The [employer] Custom Cancers + RNA Panel includes sequencing, deletion/duplication, and RNA analysis of the following 37 genes: APC, ATM, AXIN2, BARD1, BMPR1A, BRCA1, BRCA2, BRIP1, CDH1, CDK4*, CDKN2A (p14ARF)*, CDKN2A (p16INK4a), CHEK2, CTNNA1, DICER1, EPCAM*, FH, GREM1*, HOXB13*, MBD4*, MLH1, MSH2, MSH3, MSH6, MUTYH, NF1, NTHL1, PALB2, PMS2, POLD1, POLE, PTEN, RAD51C, RAD51D, SMAD4, SMARCA4, STK11, TP53.  *Genes without RNA analysis.    01/28/2023 - 06/27/2023 Chemotherapy   Patient is on Treatment Plan : BREAST Pembrolizumab  (200) D1 + Carboplatin  (1.5) D1,8,15 + Paclitaxel  (80) D1,8,15 q21d X 4 cycles / Pembrolizumab  (200) D1 + AC D1 q21d x 4 cycles     07/15/2023 - 12/29/2023 Chemotherapy   Patient is on Treatment Plan : BREAST  Pembrolizumab  (200) q21d x 27 weeks     09/07/2023 - 10/26/2023 Radiation Therapy   First Treatment Date: 2023-09-07 Last Treatment Date: 2023-10-26   Plan Name: CW_R_BO Site: Chest Wall, Right Technique: 3D Mode: Photon Dose Per Fraction: 1.8 Gy Prescribed Dose (Delivered / Prescribed): 25.2 Gy / 25.2 Gy Prescribed Fxs (Delivered / Prescribed): 14 / 14   Plan Name: CW_R_Neck_Ax Site: Chest Wall, Right Technique: 3D Mode: Photon Dose Per Fraction: 1.8 Gy Prescribed Dose (Delivered / Prescribed): 50.4 Gy / 50.4 Gy Prescribed Fxs (Delivered / Prescribed): 28 / 28   Plan Name: CW_R_Bst Site: Chest Wall, Right Technique: 3D Mode: Photon Dose Per Fraction: 1.8 Gy Prescribed Dose (Delivered / Prescribed): 9 Gy / 9 Gy Prescribed Fxs (Delivered / Prescribed): 5 / 5   Plan Name: CW_R Site: Chest Wall, Right Technique: 3D Mode: Photon Dose Per Fraction: 1.8 Gy Prescribed Dose (Delivered / Prescribed): 25.2 Gy / 25.2 Gy Prescribed Fxs (Delivered / Prescribed): 14 / 14     03/28/2024 -  Chemotherapy   Patient is on Treatment Plan : BREAST Fam-Trastuzumab Deruxtecan-nxki  (Enhertu ) (5.4) q21d       CHIEF COMPLIANT:   HISTORY OF PRESENT ILLNESS: Discussed the use of AI scribe software for clinical note transcription with the patient, who gave verbal consent to proceed.  History of Present Illness Suzanne Tucker is a 43 year old female with breast cancer who presents for follow-up on her treatment progress.  She is undergoing her sixth round of therapy for breast cancer and reports significant improvement in side effects. She experiences sleepiness for only one day and has no nausea or vomiting. She has  discontinued Ativan, Xanax , and some nausea medications but continues Protonix , melatonin, and occasionally uses Tums and Allegra .  Genetic testing shows a TP53 mutation at a low level (0.2%). The tumor fraction in the cell-free DNA is less than 0.05%.  She maintains  physical activity, including hiking, without breathing issues. She recently [place] and plans to continue hiking regularly. No new or worsening side effects from her treatment. She feels well and engages in physical activities without difficulty.     ALLERGIES:  is allergic to antihistamines, loratadine-type; kiwi extract; morphine; oxycontin  [oxycodone ]; tape; tessalon [benzonatate]; tramadol ; codeine; hydrocodone; and latex.  MEDICATIONS:  Current Outpatient Medications  Medication Sig Dispense Refill   acetaminophen  (TYLENOL ) 500 MG tablet Take 1,000 mg by mouth every 6 (six) hours as needed for fever or mild pain (pain score 1-3).     ALPRAZolam  (XANAX ) 0.5 MG tablet Take 1 tablet (0.5 mg total) by mouth 3 (three) times daily as needed for anxiety. 30 tablet 1   calcium  elemental as carbonate (BARIATRIC TUMS ULTRA) 400 MG chewable tablet Chew 2,000 mg by mouth daily as needed for indigestion or heartburn.     dexamethasone  (DECADRON ) 4 MG tablet Take 2 tablets (8 mg) by mouth daily for 3 days starting the day after chemotherapy. Take with food. 30 tablet 1   Dextrose -Fructose-Sod Citrate (NAUZENE) [id number] MG CHEW Chew 1 tablet by mouth daily as needed (Nausea).     fexofenadine  (ALLEGRA ) 180 MG tablet Take 180 mg by mouth daily. (Patient not taking: Reported on 06/21/2024)     fluconazole  (DIFLUCAN ) 100 MG tablet TAKE 1 TABLET (100 MG TOTAL) BY MOUTH DAILY. USE AS DISCUSSED WITH EACH CHEMO TREATMENT 30 tablet 1   fluticasone  (FLONASE ) 50 MCG/ACT nasal spray Place 2 sprays into both nostrils daily as needed for allergies.     lidocaine -prilocaine  (EMLA ) cream Apply to affected area once 30 g 3   Melatonin 10 MG TABS Take 2.5 mg by mouth at bedtime. (Patient not taking: Reported on 06/21/2024)     Multiple Vitamins-Minerals (WOMENS MULTI VITAMIN & MINERAL PO) Take 1 tablet by mouth daily.     nystatin  (MYCOSTATIN ) 100000 UNIT/ML suspension Take 5 mLs (500,000 Units total) by  mouth 4 (four) times daily. 60 mL 0   ondansetron  (ZOFRAN ) 8 MG tablet Take 1 tablet (8 mg total) by mouth every 8 (eight) hours as needed for nausea or vomiting. Start on the third day after chemotherapy. 30 tablet 1   pantoprazole  (PROTONIX ) 40 MG tablet Take 1 tablet (40 mg total) by mouth 2 (two) times daily. Protonix  40mg  PO BID for 8 weeks then reduce to 40 mg daily 180 tablet 3   No current facility-administered medications for this visit.    PHYSICAL EXAMINATION: ECOG PERFORMANCE STATUS: 1 - Symptomatic but completely ambulatory  There were no vitals filed for this visit. There were no vitals filed for this visit.  Physical Exam   (exam performed in the presence of a chaperone)  LABORATORY DATA:  I have reviewed the data as listed    Latest Ref Rng & Units 06/21/2024    1:11 PM 05/31/2024    9:58 AM 05/11/2024    8:05 AM  CMP  Glucose 70 - 99 mg/dL 893  894  96   BUN 6 - 20 mg/dL 11  11  8    Creatinine 0.44 - 1.00 mg/dL 9.44  9.44  9.41   Sodium 135 - 145 mmol/L 140  140  138   Potassium  3.5 - 5.1 mmol/L 3.5  3.7  3.6   Chloride 98 - 111 mmol/L 108  107  106   CO2 22 - 32 mmol/L 28  29  27    Calcium  8.9 - 10.3 mg/dL 8.9  9.1  9.2   Total Protein 6.5 - 8.1 g/dL 6.8  6.5  6.8   Total Bilirubin 0.0 - 1.2 mg/dL 0.4  0.5  0.5   Alkaline Phos 38 - 126 U/L 84  75  89   AST 15 - 41 U/L 20  22  19    ALT 0 - 44 U/L 12  16  10      Lab Results  Component Value Date   WBC 4.4 07/12/2024   HGB 10.2 (L) 07/12/2024   HCT 29.0 (L) 07/12/2024   MCV 92.9 07/12/2024   PLT 176 07/12/2024   NEUTROABS 2.8 07/12/2024    ASSESSMENT & PLAN:  Malignant neoplasm of lower-inner quadrant of right breast of female, estrogen receptor negative (HCC) 01/10/2023:Palpable lump right axilla x 2 weeks bulky axillary lymphadenopathy ultrasound breast: 2 masses at 4 o'clock position 1.1 cm and 0.6 cm multiple axillary lymph nodes largest 4.5 cm also level 2 and 3 (subpectoral lymph node as well):  Biopsy: Grade 3 IDC ER 0%, PR 0%, Ki-67 95%, HER2 1+  01/25/2023: PET/CT: Right breast cancer, right axilla, retropectoral and right cervical lymph nodes   Treatment plan: Neoadjuvant chemotherapy with Taxol  carbo Keytruda  weekly x 12 followed by Adriamycin  Cytoxan  Keytruda  followed by Keytruda  maintenance until 12/30/2023 07/27/2023: Bilateral mastectomies: Left mastectomy: Benign, right mastectomy: No evidence of residual cancer, 8/20 lymph nodes positive with extranodal extension in 2 lymph nodes Adjuvant radiation therapy 09/08/2023-10/24/2022 Capecitabine  (completed June 2025) with Keytruda  (completed 12/29/2023) --------------------------------------------------------------------------------------- 03/09/2024: PET/CT: 4 mm node right axilla SUV 3.7, right subpectoral activity SUV 3.4 (possible 5 mm node) right apical 7 mm nodule is new compared to 12/23/2023 with an SUV of 2.3, right upper lobe 1 cm nodule new with a SUV of 3.6, left upper lobe nodule 7 mm SUV 4.2, soft tissue fullness proximal duodenum SUV 8.7 (possible duodenitis)   EGD: Bx: Duodenitis Lung FNA: malignant cells consistent with breast primary (triple negative) [facility] molecular testing: T p53, TMB 10.3 Brain MRI: Neg   Treatment plan: Enhertu  started 03/28/24, today is cycle 6 Toxicities: Fatigue Throat redness: Thrush: Diflucan  was prescribed and it is better Neutropenia: I recommended adding Neulasta  injection on day 3 and reducing the dosage of her chemotherapy. Chemo induced anemia: Monitoring closely.   CT CAP 05/16/2024: Multiple pulmonary nodules in both lung apices are diminished in size or completely resolved, consistent with treatment response. Guardant360: TP53 gene mutation  We discussed that we can wait a few more cycles before doing another scan. She tells me that she has been [place]. Return to clinic every 3 weeks for Enhertu  Assessment & Plan Malignant neoplasm of lower-inner  quadrant of right breast, estrogen receptor negative Undergoing Enhertu  treatment with effective response. Low tumor fraction in cell-free DNA. No new mutations for targeted therapy. Low tumor mutation burden, no benefit from immunotherapy. Minimal side effects, mainly sleepiness. - Continue Enhertu  regimen. - Delay scan until after more treatment rounds unless clinically indicated.  Anemia due to antineoplastic chemotherapy Anemia secondary to chemotherapy with low hemoglobin. Stable white blood cell and platelet counts. - Monitor hemoglobin and red blood cell levels. - Consider transfusion or dose adjustment if hemoglobin drops significantly.      No orders of  the defined types were placed in this encounter.  The patient has a good understanding of the overall plan. she agrees with it. she will call with any problems that may develop before the next visit here.  I personally spent a total of 30 minutes in the care of the patient today including preparing to see the patient, getting/reviewing separately obtained history, performing a medically appropriate exam/evaluation, counseling and educating, placing orders, referring and communicating with other health care professionals, documenting clinical information in the EHR, independently interpreting results, communicating results, and coordinating care.   Viinay K Tambria Pfannenstiel, MD 07/12/24

## 2024-07-12 NOTE — Progress Notes (Signed)
 [facility] Spiritual Care Note  Followed up with Belkys in infusion. She reports doing well overall in recent weeks, both in terms of mood and efficacy of cancer treatment. She notes that there was one unusual period before then in which she felt uncharacteristically down and depressed for about four days, but that those feelings have abated and not returned. Reminded her of ongoing Spiritual Care availability both for support and for support referrals if needed.   Continuing to follow, and Leni knows to contact chaplain whenever needed/desired.  [address] [name], [place], [facility] Pager [phone] Voicemail [phone]

## 2024-07-12 NOTE — Assessment & Plan Note (Signed)
 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%, HER2 1+  01/25/2023: PET/CT: Right breast cancer, right axilla, retropectoral and right cervical lymph nodes   Treatment plan: Neoadjuvant chemotherapy with Taxol  carbo Keytruda  weekly x 12 followed by Adriamycin  Cytoxan  Keytruda  followed by Keytruda  maintenance until 12/30/2023 07/27/2023: Bilateral mastectomies: Left mastectomy: Benign, right mastectomy: No evidence of residual cancer, 8/20 lymph nodes positive with extranodal extension in 2 lymph nodes Adjuvant radiation therapy 09/08/2023-10/24/2022 Capecitabine  (completed June 2025) with Keytruda  (completed 12/29/2023) --------------------------------------------------------------------------------------- 03/09/2024: PET/CT: 4 mm node right axilla SUV 3.7, right subpectoral activity SUV 3.4 (possible 5 mm node) right apical 7 mm nodule is new compared to 12/23/2023 with an SUV of 2.3, right upper lobe 1 cm nodule new with a SUV of 3.6, left upper lobe nodule 7 mm SUV 4.2, soft tissue fullness proximal duodenum SUV 8.7 (possible duodenitis)   EGD: Bx: Duodenitis Lung FNA: malignant cells consistent with breast primary (triple negative) [facility] molecular testing: T p53, TMB 10.3 Brain MRI: Neg   Treatment plan: Enhertu  started 03/28/24, today is cycle 6 Toxicities: Fatigue Throat redness: Thrush: Diflucan  was prescribed and it is better Neutropenia: I recommended adding Neulasta  injection on day 3 and reducing the dosage of her chemotherapy. Chemo induced anemia: Monitoring closely.   CT CAP 05/16/2024: Multiple pulmonary nodules in both lung apices are diminished in size or completely resolved, consistent with treatment response. Guardant360: P53 gene mutation

## 2024-07-13 ENCOUNTER — Other Ambulatory Visit: Payer: Self-pay | Admitting: Pharmacist

## 2024-07-13 ENCOUNTER — Encounter: Payer: Self-pay | Admitting: Hematology and Oncology

## 2024-07-16 ENCOUNTER — Telehealth: Payer: Self-pay | Admitting: Hematology and Oncology

## 2024-07-16 ENCOUNTER — Ambulatory Visit (HOSPITAL_COMMUNITY)
Admission: RE | Admit: 2024-07-16 | Discharge: 2024-07-16 | Disposition: A | Discharge status: Home / Self Care | Source: Ambulatory Visit | Attending: Adult Health | Admitting: Adult Health

## 2024-07-16 DIAGNOSIS — Z0189 Encounter for other specified special examinations: Secondary | ICD-10-CM

## 2024-07-16 DIAGNOSIS — C50311 Malignant neoplasm of lower-inner quadrant of right female breast: Secondary | ICD-10-CM | POA: Diagnosis present

## 2024-07-16 DIAGNOSIS — Z171 Estrogen receptor negative status [ER-]: Secondary | ICD-10-CM | POA: Diagnosis not present

## 2024-07-16 LAB — ECHOCARDIOGRAM COMPLETE
Area-P 1/2: 6.12 cm2
Calc EF: 64.9 %
S' Lateral: 3 cm
Single Plane A2C EF: 61.6 %
Single Plane A4C EF: 71.1 %

## 2024-07-16 NOTE — Telephone Encounter (Signed)
 Suzanne Tucker has been contacted to confirm her appointments scheduled on 10/30. She has confirmed that this date and time works for her.

## 2024-07-19 ENCOUNTER — Encounter: Payer: Self-pay | Admitting: *Deleted

## 2024-07-23 ENCOUNTER — Encounter: Payer: Self-pay | Admitting: Hematology and Oncology

## 2024-07-23 ENCOUNTER — Encounter: Admitting: Adult Health

## 2024-07-26 ENCOUNTER — Other Ambulatory Visit: Payer: Self-pay | Admitting: Hematology and Oncology

## 2024-07-26 DIAGNOSIS — C50311 Malignant neoplasm of lower-inner quadrant of right female breast: Secondary | ICD-10-CM

## 2024-07-31 ENCOUNTER — Other Ambulatory Visit: Payer: Self-pay

## 2024-08-02 ENCOUNTER — Inpatient Hospital Stay

## 2024-08-02 ENCOUNTER — Encounter: Payer: Self-pay | Admitting: Adult Health

## 2024-08-02 ENCOUNTER — Inpatient Hospital Stay: Admitting: Adult Health

## 2024-08-02 VITALS — BP 108/65 | HR 71 | Temp 97.9°F | Resp 18 | Ht 62.0 in | Wt 144.5 lb

## 2024-08-02 VITALS — BP 101/65 | HR 72 | Resp 16

## 2024-08-02 DIAGNOSIS — Z171 Estrogen receptor negative status [ER-]: Secondary | ICD-10-CM | POA: Diagnosis not present

## 2024-08-02 DIAGNOSIS — C50311 Malignant neoplasm of lower-inner quadrant of right female breast: Secondary | ICD-10-CM

## 2024-08-02 LAB — CMP (CANCER CENTER ONLY)
ALT: 14 U/L (ref 0–44)
AST: 23 U/L (ref 15–41)
Albumin: 4.2 g/dL (ref 3.5–5.0)
Alkaline Phosphatase: 93 U/L (ref 38–126)
Anion gap: 7 (ref 5–15)
BUN: 8 mg/dL (ref 6–20)
CO2: 28 mmol/L (ref 22–32)
Calcium: 9.2 mg/dL (ref 8.9–10.3)
Chloride: 106 mmol/L (ref 98–111)
Creatinine: 0.63 mg/dL (ref 0.44–1.00)
GFR, Estimated: >60 mL/min (ref >60)
Glucose, Bld: 103 mg/dL — ABNORMAL HIGH (ref 70–99)
Potassium: 3.7 mmol/L (ref 3.5–5.1)
Sodium: 141 mmol/L (ref 135–145)
Total Bilirubin: 0.6 mg/dL (ref 0.0–1.2)
Total Protein: 6.9 g/dL (ref 6.5–8.1)

## 2024-08-02 LAB — CBC WITH DIFFERENTIAL (CANCER CENTER ONLY)
Abs Immature Granulocytes: 0 K/uL (ref 0.00–0.07)
Basophils Absolute: 0 K/uL (ref 0.0–0.1)
Basophils Relative: 0 %
Eosinophils Absolute: 0.1 K/uL (ref 0.0–0.5)
Eosinophils Relative: 3 %
HCT: 31.3 % — ABNORMAL LOW (ref 36.0–46.0)
Hemoglobin: 11.1 g/dL — ABNORMAL LOW (ref 12.0–15.0)
Immature Granulocytes: 0 %
Lymphocytes Relative: 19 %
Lymphs Abs: 0.6 K/uL — ABNORMAL LOW (ref 0.7–4.0)
MCH: 33.3 pg (ref 26.0–34.0)
MCHC: 35.5 g/dL (ref 30.0–36.0)
MCV: 94 fL (ref 80.0–100.0)
Monocytes Absolute: 0.4 K/uL (ref 0.1–1.0)
Monocytes Relative: 11 %
Neutro Abs: 2.2 K/uL (ref 1.7–7.7)
Neutrophils Relative %: 67 %
Platelet Count: 169 K/uL (ref 150–400)
RBC: 3.33 MIL/uL — ABNORMAL LOW (ref 3.87–5.11)
RDW: 13.9 % (ref 11.5–15.5)
WBC Count: 3.3 K/uL — ABNORMAL LOW (ref 4.0–10.5)
nRBC: 0 % (ref 0.0–0.2)

## 2024-08-02 MED ORDER — APREPITANT 130 MG/18ML IV EMUL
130.0000 mg | Freq: Once | INTRAVENOUS | Status: AC
  Administered 2024-08-02: 130 mg via INTRAVENOUS
  Filled 2024-08-02: qty 18

## 2024-08-02 MED ORDER — PALONOSETRON HCL INJECTION 0.25 MG/5ML
0.2500 mg | Freq: Once | INTRAVENOUS | Status: AC
  Administered 2024-08-02: 0.25 mg via INTRAVENOUS
  Filled 2024-08-02: qty 5

## 2024-08-02 MED ORDER — FAM-TRASTUZUMAB DERUXTECAN-NXKI CHEMO 100 MG IV SOLR
4.4000 mg/kg | Freq: Once | INTRAVENOUS | Status: AC
  Administered 2024-08-02: 300 mg via INTRAVENOUS
  Filled 2024-08-02: qty 15

## 2024-08-02 MED ORDER — DEXTROSE 5 % IV SOLN: INTRAVENOUS | Status: DC

## 2024-08-02 MED ORDER — DEXAMETHASONE SOD PHOSPHATE PF 10 MG/ML IJ SOLN
10.0000 mg | Freq: Once | INTRAMUSCULAR | Status: AC
  Administered 2024-08-02: 10 mg via INTRAVENOUS

## 2024-08-02 MED ORDER — ACETAMINOPHEN 325 MG PO TABS
650.0000 mg | ORAL_TABLET | Freq: Once | ORAL | Status: AC
  Administered 2024-08-02: 650 mg via ORAL
  Filled 2024-08-02: qty 2

## 2024-08-02 MED ORDER — DIPHENHYDRAMINE HCL 25 MG PO CAPS
25.0000 mg | ORAL_CAPSULE | Freq: Once | ORAL | Status: AC
  Administered 2024-08-02: 25 mg via ORAL
  Filled 2024-08-02: qty 1

## 2024-08-02 MED ORDER — PEGFILGRASTIM INF DEV 6 MG/0.6ML ~~LOC~~ SOSY
6.0000 mg | PREFILLED_SYRINGE | Freq: Once | SUBCUTANEOUS | Status: AC
  Administered 2024-08-02: 6 mg via SUBCUTANEOUS
  Filled 2024-08-02: qty 0.6

## 2024-08-02 NOTE — Patient Instructions (Signed)

## 2024-08-02 NOTE — Progress Notes (Signed)
 [facility] Cancer Follow up:    Suzanne Potts, MD [address] [place] [address]   DIAGNOSIS:  Cancer Staging  Malignant neoplasm of lower-inner quadrant of right breast of female, estrogen receptor negative (HCC) Staging form: Breast, AJCC 8th Edition - Clinical: Stage IIIC (cT2, cN3c, cM0, G3, ER-, PR-, HER2-) - Unsigned Histologic grading system: 3 grade system    SUMMARY OF ONCOLOGIC HISTORY: Oncology History  Malignant neoplasm of lower-inner quadrant of right breast of female, estrogen receptor negative (HCC)  01/10/2023 Initial Diagnosis   Palpable lump right axilla x 2 weeks bulky axillary lymphadenopathy ultrasound breast: 2 masses at 4 o'clock position 1.1 cm and 0.6 cm multiple axillary lymph nodes largest 4.5 cm also level 2 and 3 (subpectoral lymph node as well): Biopsy: Grade 3 IDC ER 0%, PR 0%, Ki-67 95%, HER2 1+   01/26/2023 Genetic Testing   Negative [employer] Custom Panel (37 genes).  Report date is 01/26/2023.    The [employer] Custom Cancers + RNA Panel includes sequencing, deletion/duplication, and RNA analysis of the following 37 genes: APC, ATM, AXIN2, BARD1, BMPR1A, BRCA1, BRCA2, BRIP1, CDH1, CDK4*, CDKN2A (p14ARF)*, CDKN2A (p16INK4a), CHEK2, CTNNA1, DICER1, EPCAM*, FH, GREM1*, HOXB13*, MBD4*, MLH1, MSH2, MSH3, MSH6, MUTYH, NF1, NTHL1, PALB2, PMS2, POLD1, POLE, PTEN, RAD51C, RAD51D, SMAD4, SMARCA4, STK11, TP53.  *Genes without RNA analysis.    01/28/2023 - 06/27/2023 Chemotherapy   Patient is on Treatment Plan : BREAST Pembrolizumab  (200) D1 + Carboplatin  (1.5) D1,8,15 + Paclitaxel  (80) D1,8,15 q21d X 4 cycles / Pembrolizumab  (200) D1 + AC D1 q21d x 4 cycles     07/15/2023 - 12/29/2023 Chemotherapy   Patient is on Treatment Plan : BREAST Pembrolizumab  (200) q21d x 27 weeks     09/07/2023 - 10/26/2023 Radiation Therapy   First Treatment Date: 2023-09-07 Last Treatment Date: 2023-10-26   Plan Name: CW_R_BO Site: Chest Wall,  Right Technique: 3D Mode: Photon Dose Per Fraction: 1.8 Gy Prescribed Dose (Delivered / Prescribed): 25.2 Gy / 25.2 Gy Prescribed Fxs (Delivered / Prescribed): 14 / 14   Plan Name: CW_R_Neck_Ax Site: Chest Wall, Right Technique: 3D Mode: Photon Dose Per Fraction: 1.8 Gy Prescribed Dose (Delivered / Prescribed): 50.4 Gy / 50.4 Gy Prescribed Fxs (Delivered / Prescribed): 28 / 28   Plan Name: CW_R_Bst Site: Chest Wall, Right Technique: 3D Mode: Photon Dose Per Fraction: 1.8 Gy Prescribed Dose (Delivered / Prescribed): 9 Gy / 9 Gy Prescribed Fxs (Delivered / Prescribed): 5 / 5   Plan Name: CW_R Site: Chest Wall, Right Technique: 3D Mode: Photon Dose Per Fraction: 1.8 Gy Prescribed Dose (Delivered / Prescribed): 25.2 Gy / 25.2 Gy Prescribed Fxs (Delivered / Prescribed): 14 / 14     03/28/2024 -  Chemotherapy   Patient is on Treatment Plan : BREAST Fam-Trastuzumab Deruxtecan-nxki  (Enhertu ) (5.4) q21d       CURRENT THERAPY: enhertu   INTERVAL HISTORY:  Discussed the use of AI scribe software for clinical note transcription with the patient, who gave verbal consent to proceed.  History of Present Illness Suzanne Tucker is a 43 year old female with metastatic breast cancer who presents for follow-up and evaluation prior to treatment. She is accompanied by her daughter, Suzanne Tucker.  She reports feeling well and notes an improvement in her condition. Recent lab results indicate a hemoglobin level of 11.1. She has no new symptoms and maintains normal bowel and urinary functions.     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  MEDICAL HISTORY: Past Medical History:  Diagnosis Date   Anginal pain 02/2019   related to MVP   Back pain    Breast cancer (HCC)    Depression    Dysrhythmia    MVP   Mitral prolapse    PONV (postoperative nausea and vomiting)    Sleep apnea    mild no cpap indicated    SURGICAL HISTORY: Past Surgical History:  Procedure Laterality Date   BREAST SURGERY Right 2024   Breast biopsy x 2   BRONCHIAL BIOPSY  03/19/2024   Procedure: BRONCHOSCOPY, WITH BIOPSY;  Surgeon: Shelah Lamar RAMAN, MD;  Location: [facility];  Service: Pulmonary;;   BRONCHIAL BRUSHINGS  03/19/2024   Procedure: BRONCHOSCOPY, WITH BRUSH BIOPSY;  Surgeon: Shelah Lamar RAMAN, MD;  Location: [facility];  Service: Pulmonary;;   BRONCHIAL NEEDLE ASPIRATION BIOPSY  03/19/2024   Procedure: BRONCHOSCOPY, WITH NEEDLE ASPIRATION BIOPSY;  Surgeon: Shelah Lamar RAMAN, MD;  Location: [facility];  Service: Pulmonary;;   BRONCHOSCOPY, WITH BIOPSY USING ELECTROMAGNETIC NAVIGATION Bilateral 03/19/2024   Procedure: BRONCHOSCOPY, WITH BIOPSY USING ELECTROMAGNETIC NAVIGATION;  Surgeon: Shelah Lamar RAMAN, MD;  Location: [facility];  Service: Pulmonary;  Laterality: Bilateral;  WITH FLURO   CESAREAN SECTION  03/30/2008   MASTECTOMY WITH AXILLARY LYMPH NODE DISSECTION Bilateral 07/27/2023   Procedure: BILATERAL MASTECTOMY WITH RIGHT AXILLARY LYMPH NODE DISSECTION;  Surgeon: Aron Shoulders, MD;  Location: [facility];  Service: General;   Laterality: Bilateral;  PEC BLOCK   PORT A CATH REVISION Right 05/23/2024   Procedure: REVISION, PORT-A-CATH INSERTION;  Surgeon: Aron Shoulders, MD;  Location: [facility];  Service: General;  Laterality: Right;   PORT-A-CATH REMOVAL Left 01/31/2024   Procedure: REMOVAL PORT-A-CATH;  Surgeon: Aron Shoulders, MD;  Location: [facility];  Service: General;  Laterality: Left;   PORTACATH PLACEMENT N/A 01/27/2023   Procedure: INSERTION PORT-A-CATH;  Surgeon: Aron Shoulders, MD;  Location: [facility];  Service: General;  Laterality: N/A;  leaving accessed   PORTACATH PLACEMENT Left 03/27/2024   Procedure: INSERTION, TUNNELED CENTRAL VENOUS DEVICE, WITH PORT;  Surgeon: Aron Shoulders, MD;  Location: [facility];  Service: General;  Laterality: Left;   ROBOTIC ASSISTED TOTAL HYSTERECTOMY Bilateral 03/15/2019   Procedure: XI ROBOTIC ASSISTED TOTAL HYSTERECTOMY with Bilateral Salpingectomy;  Surgeon: Darcel Pool, MD;  Location: [facility];  Service: Gynecology;  Laterality: Bilateral;   TUBAL LIGATION  2017    SOCIAL HISTORY: Social History   Socioeconomic History   Marital status: Married    Spouse name: Not on file   Number of children: Not on file   Years of education: Not on file   Highest education level: Master's degree (e.g., MA, MS, MEng, MEd, MSW, MBA)  Occupational History   Not on file  Tobacco Use  Smoking status: Never   Smokeless tobacco: Never  Vaping Use   Vaping status: Never Used  Substance and Sexual Activity   Alcohol use: Not Currently    Comment:     Drug use: No   Sexual activity: Yes    Birth control/protection: Surgical  Other Topics Concern   Not on file  Social History Narrative   Not on file   Social Drivers of Health   Financial Resource Strain: Low Risk  (12/27/2022)   Overall Financial Resource Strain (CARDIA)    Difficulty of Paying Living Expenses: Not very hard  Food Insecurity: No Food Insecurity (08/24/2023)    Hunger Vital Sign    Worried About Running Out of Food in the Last Year: Never true    Ran Out of Food in the Last Year: Never true  Transportation Needs: No Transportation Needs (08/24/2023)   PRAPARE - Administrator, Civil Service (Medical): No    Lack of Transportation (Non-Medical): No  Physical Activity: Insufficiently Active (12/27/2022)   Exercise Vital Sign    Days of Exercise per Week: 4 days    Minutes of Exercise per Session: 20 min  Stress: No Stress Concern Present (12/27/2022)   [employer] of Occupational Health - Occupational Stress Questionnaire    Feeling of Stress : Only a little  Social Connections: Moderately Isolated (12/27/2022)   Social Connection and Isolation Panel    Frequency of Communication with Friends and Family: More than three times a week    Frequency of Social Gatherings with Friends and Family: Once a week    Attends Religious Services: Never    Database Administrator or Organizations: No    Attends Engineer, Structural: Not on file    Marital Status: Married  Catering Manager Violence: Not At Risk (08/24/2023)   Humiliation, Afraid, Rape, and Kick questionnaire    Fear of Current or Ex-Partner: No    Emotionally Abused: No    Physically Abused: No    Sexually Abused: No    FAMILY HISTORY: Family History  Problem Relation Age of Onset   Stroke Mother    Alzheimer's disease Father    Diabetes Maternal Grandmother    Heart disease Maternal Grandmother    Heart attack Maternal Grandmother    Diabetes Maternal Grandfather    Heart disease Maternal Grandfather    Alzheimer's disease Paternal Grandmother    Dementia Paternal Grandmother    Alzheimer's disease Paternal Grandfather    Dementia Paternal Grandfather    Alcohol abuse Paternal Grandfather     Review of Systems  Constitutional:  Negative for appetite change, chills, fatigue, fever and unexpected weight change.  HENT:   Negative for hearing loss,  lump/mass and trouble swallowing.   Eyes:  Negative for eye problems and icterus.  Respiratory:  Negative for chest tightness, cough and shortness of breath.   Cardiovascular:  Negative for chest pain, leg swelling and palpitations.  Gastrointestinal:  Negative for abdominal distention, abdominal pain, constipation, diarrhea, nausea and vomiting.  Endocrine: Negative for hot flashes.  Genitourinary:  Negative for difficulty urinating.   Musculoskeletal:  Negative for arthralgias.  Skin:  Negative for itching and rash.  Neurological:  Negative for dizziness, extremity weakness, headaches and numbness.  Hematological:  Negative for adenopathy. Does not bruise/bleed easily.  Psychiatric/Behavioral:  Negative for depression. The patient is not nervous/anxious.       PHYSICAL EXAMINATION    Vitals:   08/02/24 1037  BP: 108/65  Pulse: 71  Resp: 18  Temp: 97.9 F (36.6 C)  SpO2: 100%    Physical Exam Constitutional:      General: She is not in acute distress.    Appearance: Normal appearance. She is not toxic-appearing.  HENT:     Head: Normocephalic and atraumatic.     Mouth/Throat:     Mouth: Mucous membranes are moist.     Pharynx: Oropharynx is clear. No oropharyngeal exudate or posterior oropharyngeal erythema.  Eyes:     General: No scleral icterus. Cardiovascular:     Rate and Rhythm: Normal rate and regular rhythm.     Pulses: Normal pulses.     Heart sounds: Normal heart sounds.  Pulmonary:     Effort: Pulmonary effort is normal.     Breath sounds: Normal breath sounds.  Abdominal:     General: Abdomen is flat. Bowel sounds are normal. There is no distension.     Palpations: Abdomen is soft.     Tenderness: There is no abdominal tenderness.  Musculoskeletal:        General: No swelling.     Cervical back: Neck supple.  Lymphadenopathy:     Cervical: No cervical adenopathy.  Skin:    General: Skin is warm and dry.     Findings: No rash.  Neurological:      General: No focal deficit present.     Mental Status: She is alert.  Psychiatric:        Mood and Affect: Mood normal.        Behavior: Behavior normal.     LABORATORY DATA:  CBC    Component Value Date/Time   WBC 3.3 (L) 08/02/2024 0900   WBC 10.0 08/11/2023 0353   RBC 3.33 (L) 08/02/2024 0900   HGB 11.1 (L) 08/02/2024 0900   HGB 11.6 04/08/2022 1037   HCT 31.3 (L) 08/02/2024 0900   HCT 35.8 04/08/2022 1037   PLT 169 08/02/2024 0900   PLT 244 04/08/2022 1037   MCV 94.0 08/02/2024 0900   MCV 89 04/08/2022 1037   MCH 33.3 08/02/2024 0900   MCHC 35.5 08/02/2024 0900   RDW 13.9 08/02/2024 0900   RDW 12.1 04/08/2022 1037   LYMPHSABS 0.6 (L) 08/02/2024 0900   LYMPHSABS 1.3 04/08/2022 1037   MONOABS 0.4 08/02/2024 0900   EOSABS 0.1 08/02/2024 0900   EOSABS 0.1 04/08/2022 1037   BASOSABS 0.0 08/02/2024 0900   BASOSABS 0.0 04/08/2022 1037    CMP     Component Value Date/Time   NA 141 08/02/2024 0900   NA 140 04/08/2022 1037   K 3.7 08/02/2024 0900   CL 106 08/02/2024 0900   CO2 28 08/02/2024 0900   GLUCOSE 103 (H) 08/02/2024 0900   BUN 8 08/02/2024 0900   BUN 9 04/08/2022 1037   CREATININE 0.63 08/02/2024 0900   CALCIUM  9.2 08/02/2024 0900   PROT 6.9 08/02/2024 0900   PROT 6.5 04/08/2022 1037   ALBUMIN 4.2 08/02/2024 0900   ALBUMIN 4.3 04/08/2022 1037   AST 23 08/02/2024 0900   ALT 14 08/02/2024 0900   ALKPHOS 93 08/02/2024 0900   BILITOT 0.6 08/02/2024 0900   GFRNONAA >60 08/02/2024 0900   GFRAA 132 11/04/2020 1208     ASSESSMENT and THERAPY PLAN:   Malignant neoplasm of lower-inner quadrant of right breast of female, estrogen receptor negative (HCC) 01/10/2023:Palpable lump right axilla x 2 weeks bulky axillary lymphadenopathy ultrasound breast: 2 masses at 4 o'clock position 1.1 cm and 0.6 cm multiple  axillary lymph nodes largest 4.5 cm also level 2 and 3 (subpectoral lymph node as well): Biopsy: Grade 3 IDC ER 0%, PR 0%, Ki-67 95%, HER2 1+  01/25/2023:  PET/CT: Right breast cancer, right axilla, retropectoral and right cervical lymph nodes   Treatment plan: Neoadjuvant chemotherapy with Taxol  carbo Keytruda  weekly x 12 followed by Adriamycin  Cytoxan  Keytruda  followed by Keytruda  maintenance until 12/30/2023 07/27/2023: Bilateral mastectomies: Left mastectomy: Benign, right mastectomy: No evidence of residual cancer, 8/20 lymph nodes positive with extranodal extension in 2 lymph nodes Adjuvant radiation therapy 09/08/2023-10/24/2022 Capecitabine  (completed June 2025) with Keytruda  (completed 12/29/2023) --------------------------------------------------------------------------------------- 03/09/2024: PET/CT: 4 mm node right axilla SUV 3.7, right subpectoral activity SUV 3.4 (possible 5 mm node) right apical 7 mm nodule is new compared to 12/23/2023 with an SUV of 2.3, right upper lobe 1 cm nodule new with a SUV of 3.6, left upper lobe nodule 7 mm SUV 4.2, soft tissue fullness proximal duodenum SUV 8.7 (possible duodenitis)   EGD: Bx: Duodenitis Lung FNA: malignant cells consistent with breast primary (triple negative) [facility] molecular testing: T p53, TMB 10.3 Brain MRI: Neg   Treatment plan: Enhertu    CT CAP 05/16/2024: Multiple pulmonary nodules in both lung apices are diminished in size or completely resolved, consistent with treatment response. Guardant360: P53 gene mutation  Assessment and Plan Assessment & Plan Metastatic breast cancer Condition stable, manageable. Hemoglobin 11.1, normal renal and hepatic function. - Order imaging for a week before December 11th. - Proceed with scheduled treatment with Enhertu  today. - Most recent echo occurred on 07/16/2024 demonstrating LVEF of 60-65%     All questions were answered. The patient knows to call the clinic with any problems, questions or concerns. We can certainly see the patient much sooner if necessary.  Total encounter time:30 minutes*in face-to-face visit time, chart review, lab  review, care coordination, order entry, and documentation of the encounter time.    Morna Kendall, NP 08/03/24 8:30 PM Medical Oncology and Hematology [facility] [address] [place], [place] [address] Tel. [phone]    Fax. [phone]  *Total Encounter Time as defined by the Centers for Medicare and Medicaid Services includes, in addition to the face-to-face time of a patient visit (documented in the note above) non-face-to-face time: obtaining and reviewing outside history, ordering and reviewing medications, tests or procedures, care coordination (communications with other health care professionals or caregivers) and documentation in the medical record.

## 2024-08-03 ENCOUNTER — Encounter: Payer: Self-pay | Admitting: Hematology and Oncology

## 2024-08-03 NOTE — Assessment & Plan Note (Signed)
 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%, HER2 1+  01/25/2023: PET/CT: Right breast cancer, right axilla, retropectoral and right cervical lymph nodes   Treatment plan: Neoadjuvant chemotherapy with Taxol  carbo Keytruda  weekly x 12 followed by Adriamycin  Cytoxan  Keytruda  followed by Keytruda  maintenance until 12/30/2023 07/27/2023: Bilateral mastectomies: Left mastectomy: Benign, right mastectomy: No evidence of residual cancer, 8/20 lymph nodes positive with extranodal extension in 2 lymph nodes Adjuvant radiation therapy 09/08/2023-10/24/2022 Capecitabine  (completed June 2025) with Keytruda  (completed 12/29/2023) --------------------------------------------------------------------------------------- 03/09/2024: PET/CT: 4 mm node right axilla SUV 3.7, right subpectoral activity SUV 3.4 (possible 5 mm node) right apical 7 mm nodule is new compared to 12/23/2023 with an SUV of 2.3, right upper lobe 1 cm nodule new with a SUV of 3.6, left upper lobe nodule 7 mm SUV 4.2, soft tissue fullness proximal duodenum SUV 8.7 (possible duodenitis)   EGD: Bx: Duodenitis Lung FNA: malignant cells consistent with breast primary (triple negative) [facility] molecular testing: T p53, TMB 10.3 Brain MRI: Neg   Treatment plan: Enhertu    CT CAP 05/16/2024: Multiple pulmonary nodules in both lung apices are diminished in size or completely resolved, consistent with treatment response. Guardant360: P53 gene mutation

## 2024-08-06 ENCOUNTER — Other Ambulatory Visit: Payer: Self-pay

## 2024-08-07 ENCOUNTER — Ambulatory Visit (HOSPITAL_COMMUNITY)
Admission: RE | Admit: 2024-08-07 | Discharge: 2024-08-07 | Disposition: A | Discharge status: Home / Self Care | Source: Ambulatory Visit | Attending: Adult Health | Admitting: Adult Health

## 2024-08-07 DIAGNOSIS — C50311 Malignant neoplasm of lower-inner quadrant of right female breast: Secondary | ICD-10-CM | POA: Insufficient documentation

## 2024-08-07 DIAGNOSIS — Z171 Estrogen receptor negative status [ER-]: Secondary | ICD-10-CM | POA: Insufficient documentation

## 2024-08-07 LAB — GUARDANT REVEAL

## 2024-08-07 MED ORDER — IOHEXOL 300 MG/ML  SOLN
100.0000 mL | Freq: Once | INTRAMUSCULAR | Status: AC | PRN
  Administered 2024-08-07: 100 mL via INTRAVENOUS

## 2024-08-14 ENCOUNTER — Encounter: Payer: Self-pay | Admitting: Hematology and Oncology

## 2024-08-23 ENCOUNTER — Inpatient Hospital Stay: Attending: Hematology and Oncology

## 2024-08-23 ENCOUNTER — Inpatient Hospital Stay (HOSPITAL_BASED_OUTPATIENT_CLINIC_OR_DEPARTMENT_OTHER): Admitting: Adult Health

## 2024-08-23 ENCOUNTER — Inpatient Hospital Stay

## 2024-08-23 ENCOUNTER — Encounter: Payer: Self-pay | Admitting: Adult Health

## 2024-08-23 VITALS — BP 113/71 | HR 72 | Temp 98.2°F | Resp 16 | Wt 146.0 lb

## 2024-08-23 DIAGNOSIS — Z171 Estrogen receptor negative status [ER-]: Secondary | ICD-10-CM | POA: Insufficient documentation

## 2024-08-23 DIAGNOSIS — Z79899 Other long term (current) drug therapy: Secondary | ICD-10-CM | POA: Insufficient documentation

## 2024-08-23 DIAGNOSIS — Z5112 Encounter for antineoplastic immunotherapy: Secondary | ICD-10-CM | POA: Diagnosis present

## 2024-08-23 DIAGNOSIS — C50311 Malignant neoplasm of lower-inner quadrant of right female breast: Secondary | ICD-10-CM | POA: Diagnosis present

## 2024-08-23 DIAGNOSIS — Z9013 Acquired absence of bilateral breasts and nipples: Secondary | ICD-10-CM | POA: Diagnosis not present

## 2024-08-23 DIAGNOSIS — D649 Anemia, unspecified: Secondary | ICD-10-CM | POA: Insufficient documentation

## 2024-08-23 LAB — CBC WITH DIFFERENTIAL (CANCER CENTER ONLY)
Abs Immature Granulocytes: 0.01 K/uL (ref 0.00–0.07)
Basophils Absolute: 0 K/uL (ref 0.0–0.1)
Basophils Relative: 1 %
Eosinophils Absolute: 0.3 K/uL (ref 0.0–0.5)
Eosinophils Relative: 7 %
HCT: 31.4 % — ABNORMAL LOW (ref 36.0–46.0)
Hemoglobin: 10.8 g/dL — ABNORMAL LOW (ref 12.0–15.0)
Immature Granulocytes: 0 %
Lymphocytes Relative: 18 %
Lymphs Abs: 0.7 K/uL (ref 0.7–4.0)
MCH: 32.9 pg (ref 26.0–34.0)
MCHC: 34.4 g/dL (ref 30.0–36.0)
MCV: 95.7 fL (ref 80.0–100.0)
Monocytes Absolute: 0.5 K/uL (ref 0.1–1.0)
Monocytes Relative: 13 %
Neutro Abs: 2.5 K/uL (ref 1.7–7.7)
Neutrophils Relative %: 61 %
Platelet Count: 168 K/uL (ref 150–400)
RBC: 3.28 MIL/uL — ABNORMAL LOW (ref 3.87–5.11)
RDW: 13.7 % (ref 11.5–15.5)
WBC Count: 4.1 K/uL (ref 4.0–10.5)
nRBC: 0 % (ref 0.0–0.2)

## 2024-08-23 LAB — CMP (CANCER CENTER ONLY)
ALT: 18 U/L (ref 0–44)
AST: 27 U/L (ref 15–41)
Albumin: 4.3 g/dL (ref 3.5–5.0)
Alkaline Phosphatase: 103 U/L (ref 38–126)
Anion gap: 9 (ref 5–15)
BUN: 9 mg/dL (ref 6–20)
CO2: 28 mmol/L (ref 22–32)
Calcium: 9.3 mg/dL (ref 8.9–10.3)
Chloride: 104 mmol/L (ref 98–111)
Creatinine: 0.6 mg/dL (ref 0.44–1.00)
GFR, Estimated: >60 mL/min (ref >60)
Glucose, Bld: 90 mg/dL (ref 70–99)
Potassium: 3.6 mmol/L (ref 3.5–5.1)
Sodium: 141 mmol/L (ref 135–145)
Total Bilirubin: 0.4 mg/dL (ref 0.0–1.2)
Total Protein: 6.8 g/dL (ref 6.5–8.1)

## 2024-08-23 MED ORDER — FAM-TRASTUZUMAB DERUXTECAN-NXKI CHEMO 100 MG IV SOLR
4.4000 mg/kg | Freq: Once | INTRAVENOUS | Status: AC
  Administered 2024-08-23: 300 mg via INTRAVENOUS
  Filled 2024-08-23: qty 15

## 2024-08-23 MED ORDER — APREPITANT 130 MG/18ML IV EMUL
130.0000 mg | Freq: Once | INTRAVENOUS | Status: AC
  Administered 2024-08-23: 130 mg via INTRAVENOUS
  Filled 2024-08-23: qty 18

## 2024-08-23 MED ORDER — DEXAMETHASONE SOD PHOSPHATE PF 10 MG/ML IJ SOLN
10.0000 mg | Freq: Once | INTRAMUSCULAR | Status: AC
  Administered 2024-08-23: 10 mg via INTRAVENOUS

## 2024-08-23 MED ORDER — SODIUM CHLORIDE 0.9% FLUSH: 10.0000 mL | INTRAVENOUS | Status: DC | PRN

## 2024-08-23 MED ORDER — PEGFILGRASTIM INF DEV 6 MG/0.6ML ~~LOC~~ SOSY
6.0000 mg | PREFILLED_SYRINGE | Freq: Once | SUBCUTANEOUS | Status: AC
  Administered 2024-08-23: 6 mg via SUBCUTANEOUS
  Filled 2024-08-23: qty 0.6

## 2024-08-23 MED ORDER — DEXTROSE 5 % IV SOLN: INTRAVENOUS | Status: DC

## 2024-08-23 MED ORDER — DIPHENHYDRAMINE HCL 25 MG PO CAPS
25.0000 mg | ORAL_CAPSULE | Freq: Once | ORAL | Status: AC
  Administered 2024-08-23: 25 mg via ORAL
  Filled 2024-08-23: qty 1

## 2024-08-23 MED ORDER — PALONOSETRON HCL INJECTION 0.25 MG/5ML
0.2500 mg | Freq: Once | INTRAVENOUS | Status: AC
  Administered 2024-08-23: 0.25 mg via INTRAVENOUS
  Filled 2024-08-23: qty 5

## 2024-08-23 MED ORDER — ACETAMINOPHEN 325 MG PO TABS
650.0000 mg | ORAL_TABLET | Freq: Once | ORAL | Status: AC
  Administered 2024-08-23: 650 mg via ORAL
  Filled 2024-08-23: qty 2

## 2024-08-23 NOTE — Progress Notes (Signed)
 [facility] Cancer Follow up:    [name], MD [address] [place] [address]   DIAGNOSIS:  Cancer Staging  Malignant neoplasm of lower-inner quadrant of right breast of female, estrogen receptor negative (HCC) Staging form: Breast, AJCC 8th Edition - Clinical: Stage IIIC (cT2, cN3c, cM0, G3, ER-, PR-, HER2-) - Unsigned Histologic grading system: 3 grade system    SUMMARY OF ONCOLOGIC HISTORY: Oncology History  Malignant neoplasm of lower-inner quadrant of right breast of female, estrogen receptor negative (HCC)  01/10/2023 Initial Diagnosis   Palpable lump right axilla x 2 weeks bulky axillary lymphadenopathy ultrasound breast: 2 masses at 4 o'clock position 1.1 cm and 0.6 cm multiple axillary lymph nodes largest 4.5 cm also level 2 and 3 (subpectoral lymph node as well): Biopsy: Grade 3 IDC ER 0%, PR 0%, Ki-67 95%, HER2 1+   01/26/2023 Genetic Testing   Negative [employer] Custom Panel (37 genes).  Report date is 01/26/2023.    The [employer] Custom Cancers + RNA Panel includes sequencing, deletion/duplication, and RNA analysis of the following 37 genes: APC, ATM, AXIN2, BARD1, BMPR1A, BRCA1, BRCA2, BRIP1, CDH1, CDK4*, CDKN2A (p14ARF)*, CDKN2A (p16INK4a), CHEK2, CTNNA1, DICER1, EPCAM*, FH, GREM1*, HOXB13*, MBD4*, MLH1, MSH2, MSH3, MSH6, MUTYH, NF1, NTHL1, PALB2, PMS2, POLD1, POLE, PTEN, RAD51C, RAD51D, SMAD4, SMARCA4, STK11, TP53.  *Genes without RNA analysis.    01/28/2023 - 06/27/2023 Chemotherapy   Patient is on Treatment Plan : BREAST Pembrolizumab  (200) D1 + Carboplatin  (1.5) D1,8,15 + Paclitaxel  (80) D1,8,15 q21d X 4 cycles / Pembrolizumab  (200) D1 + AC D1 q21d x 4 cycles     07/15/2023 - 12/29/2023 Chemotherapy   Patient is on Treatment Plan : BREAST Pembrolizumab  (200) q21d x 27 weeks     09/07/2023 - 10/26/2023 Radiation Therapy   First Treatment Date: 2023-09-07 Last Treatment Date: 2023-10-26   Plan Name: CW_R_BO Site: Chest Wall,  Right Technique: 3D Mode: Photon Dose Per Fraction: 1.8 Gy Prescribed Dose (Delivered / Prescribed): 25.2 Gy / 25.2 Gy Prescribed Fxs (Delivered / Prescribed): 14 / 14   Plan Name: CW_R_Neck_Ax Site: Chest Wall, Right Technique: 3D Mode: Photon Dose Per Fraction: 1.8 Gy Prescribed Dose (Delivered / Prescribed): 50.4 Gy / 50.4 Gy Prescribed Fxs (Delivered / Prescribed): 28 / 28   Plan Name: CW_R_Bst Site: Chest Wall, Right Technique: 3D Mode: Photon Dose Per Fraction: 1.8 Gy Prescribed Dose (Delivered / Prescribed): 9 Gy / 9 Gy Prescribed Fxs (Delivered / Prescribed): 5 / 5   Plan Name: CW_R Site: Chest Wall, Right Technique: 3D Mode: Photon Dose Per Fraction: 1.8 Gy Prescribed Dose (Delivered / Prescribed): 25.2 Gy / 25.2 Gy Prescribed Fxs (Delivered / Prescribed): 14 / 14     03/28/2024 -  Chemotherapy   Patient is on Treatment Plan : BREAST Fam-Trastuzumab Deruxtecan-nxki  (Enhertu ) (5.4) q21d       CURRENT THERAPY: Enhertu   INTERVAL HISTORY:  Discussed the use of AI scribe software for clinical note transcription with the patient, who gave verbal consent to proceed.  History of Present Illness Suzanne Tucker is a 43 year old female with metastatic breast cancer who presents for follow-up on her recent CT scan results.  She is undergoing treatment for HER2-positive metastatic breast cancer. A recent CT scan of the chest, abdomen, and pelvis on August 07, 2024, shows no evidence of new or progressive metastatic disease. Previously noted small pulmonary nodules have improved, but new ill-defined clustered nodules in the left medial lower lobe are observed.  She takes  Tylenol  twice a day for joint and body pain, which she attributes to her cancer treatment. She has not engaged in yoga or other physical activities recently, which she believes might have contributed to her discomfort.  Recent blood work shows a hemoglobin level of 10.8, which has slightly  decreased, and white blood cells at 4.1, which have improved. Platelets are stable at 168.     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  MEDICAL HISTORY: Past Medical History:  Diagnosis Date   Anginal pain 02/2019   related to MVP   Back pain    Breast cancer (HCC)    Depression    Dysrhythmia    MVP   Mitral prolapse    PONV (postoperative nausea and vomiting)    Sleep apnea    mild no cpap indicated    SURGICAL HISTORY: Past Surgical History:  Procedure Laterality Date   BREAST SURGERY Right 2024   Breast biopsy x 2   BRONCHIAL BIOPSY  03/19/2024   Procedure: BRONCHOSCOPY, WITH BIOPSY;  Surgeon: Shelah Lamar RAMAN, MD;  Location: [facility];  Service: Pulmonary;;   BRONCHIAL BRUSHINGS  03/19/2024   Procedure: BRONCHOSCOPY, WITH BRUSH BIOPSY;  Surgeon: Shelah Lamar RAMAN, MD;  Location: [facility];  Service: Pulmonary;;   BRONCHIAL NEEDLE ASPIRATION BIOPSY  03/19/2024   Procedure: BRONCHOSCOPY, WITH NEEDLE ASPIRATION BIOPSY;  Surgeon: Shelah Lamar RAMAN, MD;  Location: [facility];  Service: Pulmonary;;   BRONCHOSCOPY, WITH BIOPSY USING  ELECTROMAGNETIC NAVIGATION Bilateral 03/19/2024   Procedure: BRONCHOSCOPY, WITH BIOPSY USING ELECTROMAGNETIC NAVIGATION;  Surgeon: Shelah Lamar RAMAN, MD;  Location: [facility];  Service: Pulmonary;  Laterality: Bilateral;  WITH FLURO   CESAREAN SECTION  03/30/2008   MASTECTOMY WITH AXILLARY LYMPH NODE DISSECTION Bilateral 07/27/2023   Procedure: BILATERAL MASTECTOMY WITH RIGHT AXILLARY LYMPH NODE DISSECTION;  Surgeon: Aron Shoulders, MD;  Location: [facility];  Service: General;  Laterality: Bilateral;  PEC BLOCK   PORT A CATH REVISION Right 05/23/2024   Procedure: REVISION, PORT-A-CATH INSERTION;  Surgeon: Aron Shoulders, MD;  Location: [facility];  Service: General;  Laterality: Right;   PORT-A-CATH REMOVAL Left 01/31/2024   Procedure: REMOVAL PORT-A-CATH;  Surgeon: Aron Shoulders, MD;  Location: [facility];  Service: General;  Laterality: Left;   PORTACATH PLACEMENT N/A 01/27/2023   Procedure: INSERTION PORT-A-CATH;  Surgeon: Aron Shoulders, MD;  Location: [facility];  Service: General;  Laterality: N/A;  leaving accessed   PORTACATH PLACEMENT Left 03/27/2024   Procedure: INSERTION, TUNNELED CENTRAL VENOUS DEVICE, WITH PORT;  Surgeon: Aron Shoulders, MD;  Location: [facility];  Service: General;  Laterality: Left;   ROBOTIC ASSISTED TOTAL HYSTERECTOMY Bilateral 03/15/2019   Procedure: XI ROBOTIC ASSISTED TOTAL HYSTERECTOMY with Bilateral Salpingectomy;  Surgeon: Darcel Pool, MD;  Location:  [facility];  Service: Gynecology;  Laterality: Bilateral;   TUBAL LIGATION  2017    SOCIAL HISTORY: Social History   Socioeconomic History   Marital status: Married    Spouse name: Not on file   Number of children: Not on file   Years of education: Not on file   Highest education level: Master's degree (e.g., MA, MS, MEng, MEd, MSW, MBA)  Occupational History   Not on file  Tobacco Use   Smoking status: Never   Smokeless tobacco: Never  Vaping Use    Vaping status: Never Used  Substance and Sexual Activity   Alcohol use: Not Currently    Comment:     Drug use: No   Sexual activity: Yes    Birth control/protection: Surgical  Other Topics Concern   Not on file  Social History Narrative   Not on file   Social Drivers of Health   Financial Resource Strain: Low Risk  (12/27/2022)   Overall Financial Resource Strain (CARDIA)    Difficulty of Paying Living Expenses: Not very hard  Food Insecurity: No Food Insecurity (08/24/2023)   Hunger Vital Sign    Worried About Running Out of Food in the Last Year: Never true    Ran Out of Food in the Last Year: Never true  Transportation Needs: No Transportation Needs (08/24/2023)   PRAPARE - Administrator, Civil Service (Medical): No    Lack of Transportation (Non-Medical): No  Physical Activity: Insufficiently Active (12/27/2022)   Exercise Vital Sign    Days of Exercise per Week: 4 days    Minutes of Exercise per Session: 20 min  Stress: No Stress Concern Present (12/27/2022)   [employer] of Occupational Health - Occupational Stress Questionnaire    Feeling of Stress : Only a little  Social Connections: Moderately Isolated (12/27/2022)   Social Connection and Isolation Panel    Frequency of Communication with Friends and Family: More than three times a week    Frequency of Social Gatherings with Friends and Family: Once a week    Attends Religious Services: Never    Database Administrator or Organizations: No    Attends Engineer, Structural: Not on file    Marital Status: Married  Catering Manager Violence: Not At Risk (08/24/2023)   Humiliation, Afraid, Rape, and Kick questionnaire    Fear of Current or Ex-Partner: No    Emotionally Abused: No    Physically Abused: No    Sexually Abused: No    FAMILY HISTORY: Family History  Problem Relation Age of Onset   Stroke Mother    Alzheimer's disease Father    Diabetes Maternal Grandmother    Heart disease  Maternal Grandmother    Heart attack Maternal Grandmother    Diabetes Maternal Grandfather    Heart disease Maternal Grandfather    Alzheimer's disease Paternal Grandmother    Dementia Paternal Grandmother    Alzheimer's disease Paternal Grandfather    Dementia Paternal Grandfather    Alcohol abuse Paternal Grandfather     Review of Systems  Constitutional:  Negative for appetite change, chills, fatigue, fever and unexpected weight change.  HENT:   Negative for hearing loss, lump/mass and trouble swallowing.   Eyes:  Negative for eye problems and icterus.  Respiratory:  Negative for chest tightness, cough and shortness of breath.   Cardiovascular:  Negative for chest pain, leg swelling and palpitations.  Gastrointestinal:  Negative for abdominal distention, abdominal pain, constipation, diarrhea,  nausea and vomiting.  Endocrine: Negative for hot flashes.  Genitourinary:  Negative for difficulty urinating.   Musculoskeletal:  Negative for arthralgias.  Skin:  Negative for itching and rash.  Neurological:  Negative for dizziness, extremity weakness, headaches and numbness.  Hematological:  Negative for adenopathy. Does not bruise/bleed easily.  Psychiatric/Behavioral:  Negative for depression. The patient is not nervous/anxious.       PHYSICAL EXAMINATION    There were no vitals filed for this visit.  Physical Exam Constitutional:      General: She is not in acute distress.    Appearance: Normal appearance. She is not toxic-appearing.  HENT:     Head: Normocephalic and atraumatic.     Mouth/Throat:     Mouth: Mucous membranes are moist.     Pharynx: Oropharynx is clear. No oropharyngeal exudate or posterior oropharyngeal erythema.  Eyes:     General: No scleral icterus. Cardiovascular:     Rate and Rhythm: Normal rate and regular rhythm.     Pulses: Normal pulses.     Heart sounds: Normal heart sounds.  Pulmonary:     Effort: Pulmonary effort is normal.     Breath  sounds: Normal breath sounds.  Abdominal:     General: Abdomen is flat. Bowel sounds are normal. There is no distension.     Palpations: Abdomen is soft.     Tenderness: There is no abdominal tenderness.  Musculoskeletal:        General: No swelling.     Cervical back: Neck supple.  Lymphadenopathy:     Cervical: No cervical adenopathy.  Skin:    General: Skin is warm and dry.     Findings: No rash.  Neurological:     General: No focal deficit present.     Mental Status: She is alert.  Psychiatric:        Mood and Affect: Mood normal.        Behavior: Behavior normal.     LABORATORY DATA:  CBC    Component Value Date/Time   WBC 4.1 08/23/2024 1041   WBC 10.0 08/11/2023 0353   RBC 3.28 (L) 08/23/2024 1041   HGB 10.8 (L) 08/23/2024 1041   HGB 11.6 04/08/2022 1037   HCT 31.4 (L) 08/23/2024 1041   HCT 35.8 04/08/2022 1037   PLT 168 08/23/2024 1041   PLT 244 04/08/2022 1037   MCV 95.7 08/23/2024 1041   MCV 89 04/08/2022 1037   MCH 32.9 08/23/2024 1041   MCHC 34.4 08/23/2024 1041   RDW 13.7 08/23/2024 1041   RDW 12.1 04/08/2022 1037   LYMPHSABS 0.7 08/23/2024 1041   LYMPHSABS 1.3 04/08/2022 1037   MONOABS 0.5 08/23/2024 1041   EOSABS 0.3 08/23/2024 1041   EOSABS 0.1 04/08/2022 1037   BASOSABS 0.0 08/23/2024 1041   BASOSABS 0.0 04/08/2022 1037    CMP     Component Value Date/Time   NA 141 08/23/2024 1041   NA 140 04/08/2022 1037   K 3.6 08/23/2024 1041   CL 104 08/23/2024 1041   CO2 28 08/23/2024 1041   GLUCOSE 90 08/23/2024 1041   BUN 9 08/23/2024 1041   BUN 9 04/08/2022 1037   CREATININE 0.60 08/23/2024 1041   CALCIUM  9.3 08/23/2024 1041   PROT 6.8 08/23/2024 1041   PROT 6.5 04/08/2022 1037   ALBUMIN 4.3 08/23/2024 1041   ALBUMIN 4.3 04/08/2022 1037   AST 27 08/23/2024 1041   ALT 18 08/23/2024 1041   ALKPHOS 103 08/23/2024 1041   BILITOT  0.4 08/23/2024 1041   GFRNONAA >60 08/23/2024 1041   GFRAA 132 11/04/2020 1208     ASSESSMENT and THERAPY  PLAN:   Malignant neoplasm of lower-inner quadrant of right breast of female, estrogen receptor negative (HCC) 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%, HER2 1+  01/25/2023: PET/CT: Right breast cancer, right axilla, retropectoral and right cervical lymph nodes   Treatment plan: Neoadjuvant chemotherapy with Taxol  carbo Keytruda  weekly x 12 followed by Adriamycin  Cytoxan  Keytruda  followed by Keytruda  maintenance until 12/30/2023 07/27/2023: Bilateral mastectomies: Left mastectomy: Benign, right mastectomy: No evidence of residual cancer, 8/20 lymph nodes positive with extranodal extension in 2 lymph nodes Adjuvant radiation therapy 09/08/2023-10/24/2022 Capecitabine  (completed June 2025) with Keytruda  (completed 12/29/2023) --------------------------------------------------------------------------------------- 03/09/2024: PET/CT: 4 mm node right axilla SUV 3.7, right subpectoral activity SUV 3.4 (possible 5 mm node) right apical 7 mm nodule is new compared to 12/23/2023 with an SUV of 2.3, right upper lobe 1 cm nodule new with a SUV of 3.6, left upper lobe nodule 7 mm SUV 4.2, soft tissue fullness proximal duodenum SUV 8.7 (possible duodenitis)   EGD: Bx: Duodenitis Lung FNA: malignant cells consistent with breast primary (triple negative) [facility] molecular testing: T p53, TMB 10.3 Brain MRI: Neg   Treatment plan: Enhertu    CT CAP 05/16/2024: Multiple pulmonary nodules in both lung apices are diminished in size or completely resolved, consistent with treatment response. Guardant360: P53 gene mutation   Assessment and Plan Assessment & Plan Metastatic HER2-positive breast cancer on active treatment Recent imaging showed no new or progressive metastatic  disease. Small pulmonary nodules improved. New nodules likely inflammatory, however not related to Enhertu  due to localization of inflammation. Reviewed with Dr. Geronimo will refer to pulmonology.  - Continue current treatment with Enhertu .  Snoring, possible sleep-disordered breathing Chronic snoring with potential sleep-disordered breathing. Previous dental guard not covered by insurance. - Referring to pulmonology, may require ENT referral  Mild anemia Hemoglobin at 10.8, slightly decreased. White blood cells improved, platelets stable. - Continue to monitor hemoglobin levels.  Arthralgia Joint pain possibly related to activity and weather changes. Currently managed with Tylenol . - Continue Tylenol  twice a day for joint pain. - Encouraged increased physical activity, including yoga and Pilates, as tolerated.   RTC every 3 weeks for labs, f/u, and Enhertu .     All questions were answered. The patient knows to call the clinic with any problems, questions or concerns. We can certainly see the patient much sooner if necessary.  Total encounter time:30 minutes*in face-to-face visit time, chart review, lab review, care coordination, order entry, and documentation of the encounter time.    Morna Kendall, NP 08/27/24 3:05 PM Medical Oncology and Hematology [facility] [address] [place], [place] [address] Tel. [phone]    Fax. [phone]  *Total Encounter Time as defined by the Centers for Medicare and Medicaid Services includes, in addition to the face-to-face time of a patient visit (documented in the note above) non-face-to-face time: obtaining and reviewing outside history, ordering and reviewing medications, tests or procedures, care coordination (communications with other health care professionals or caregivers) and documentation in the medical record.

## 2024-08-27 ENCOUNTER — Other Ambulatory Visit: Payer: Self-pay | Admitting: Adult Health

## 2024-08-27 ENCOUNTER — Encounter: Payer: Self-pay | Admitting: Adult Health

## 2024-08-27 ENCOUNTER — Encounter: Payer: Self-pay | Admitting: Hematology and Oncology

## 2024-08-27 ENCOUNTER — Other Ambulatory Visit: Payer: Self-pay

## 2024-08-27 DIAGNOSIS — R0683 Snoring: Secondary | ICD-10-CM

## 2024-08-27 DIAGNOSIS — R918 Other nonspecific abnormal finding of lung field: Secondary | ICD-10-CM

## 2024-08-27 NOTE — Assessment & Plan Note (Signed)
 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%, HER2 1+  01/25/2023: PET/CT: Right breast cancer, right axilla, retropectoral and right cervical lymph nodes   Treatment plan: Neoadjuvant chemotherapy with Taxol  carbo Keytruda  weekly x 12 followed by Adriamycin  Cytoxan  Keytruda  followed by Keytruda  maintenance until 12/30/2023 07/27/2023: Bilateral mastectomies: Left mastectomy: Benign, right mastectomy: No evidence of residual cancer, 8/20 lymph nodes positive with extranodal extension in 2 lymph nodes Adjuvant radiation therapy 09/08/2023-10/24/2022 Capecitabine  (completed June 2025) with Keytruda  (completed 12/29/2023) --------------------------------------------------------------------------------------- 03/09/2024: PET/CT: 4 mm node right axilla SUV 3.7, right subpectoral activity SUV 3.4 (possible 5 mm node) right apical 7 mm nodule is new compared to 12/23/2023 with an SUV of 2.3, right upper lobe 1 cm nodule new with a SUV of 3.6, left upper lobe nodule 7 mm SUV 4.2, soft tissue fullness proximal duodenum SUV 8.7 (possible duodenitis)   EGD: Bx: Duodenitis Lung FNA: malignant cells consistent with breast primary (triple negative) [facility] molecular testing: T p53, TMB 10.3 Brain MRI: Neg   Treatment plan: Enhertu    CT CAP 05/16/2024: Multiple pulmonary nodules in both lung apices are diminished in size or completely resolved, consistent with treatment response. Guardant360: P53 gene mutation

## 2024-09-10 ENCOUNTER — Encounter: Payer: Self-pay | Admitting: Hematology and Oncology

## 2024-09-13 ENCOUNTER — Encounter: Payer: Self-pay | Admitting: Adult Health

## 2024-09-13 ENCOUNTER — Inpatient Hospital Stay

## 2024-09-13 ENCOUNTER — Inpatient Hospital Stay: Attending: Hematology and Oncology

## 2024-09-13 ENCOUNTER — Inpatient Hospital Stay: Admitting: Adult Health

## 2024-09-13 VITALS — BP 129/81 | HR 76 | Temp 98.0°F | Resp 19 | Wt 144.8 lb

## 2024-09-13 VITALS — BP 98/66 | HR 73 | Resp 18

## 2024-09-13 DIAGNOSIS — Z9013 Acquired absence of bilateral breasts and nipples: Secondary | ICD-10-CM | POA: Diagnosis not present

## 2024-09-13 DIAGNOSIS — Z171 Estrogen receptor negative status [ER-]: Secondary | ICD-10-CM | POA: Diagnosis not present

## 2024-09-13 DIAGNOSIS — C50311 Malignant neoplasm of lower-inner quadrant of right female breast: Secondary | ICD-10-CM

## 2024-09-13 DIAGNOSIS — Z79899 Other long term (current) drug therapy: Secondary | ICD-10-CM | POA: Diagnosis not present

## 2024-09-13 DIAGNOSIS — Z5112 Encounter for antineoplastic immunotherapy: Secondary | ICD-10-CM | POA: Diagnosis present

## 2024-09-13 DIAGNOSIS — Z9221 Personal history of antineoplastic chemotherapy: Secondary | ICD-10-CM | POA: Diagnosis not present

## 2024-09-13 DIAGNOSIS — Z923 Personal history of irradiation: Secondary | ICD-10-CM | POA: Diagnosis not present

## 2024-09-13 LAB — CBC WITH DIFFERENTIAL (CANCER CENTER ONLY)
Abs Immature Granulocytes: 0.01 K/uL (ref 0.00–0.07)
Basophils Absolute: 0 K/uL (ref 0.0–0.1)
Basophils Relative: 0 %
Eosinophils Absolute: 0.3 K/uL (ref 0.0–0.5)
Eosinophils Relative: 8 %
HCT: 32.6 % — ABNORMAL LOW (ref 36.0–46.0)
Hemoglobin: 11.1 g/dL — ABNORMAL LOW (ref 12.0–15.0)
Immature Granulocytes: 0 %
Lymphocytes Relative: 19 %
Lymphs Abs: 0.6 K/uL — ABNORMAL LOW (ref 0.7–4.0)
MCH: 31.9 pg (ref 26.0–34.0)
MCHC: 34 g/dL (ref 30.0–36.0)
MCV: 93.7 fL (ref 80.0–100.0)
Monocytes Absolute: 0.4 K/uL (ref 0.1–1.0)
Monocytes Relative: 12 %
Neutro Abs: 1.9 K/uL (ref 1.7–7.7)
Neutrophils Relative %: 61 %
Platelet Count: 171 K/uL (ref 150–400)
RBC: 3.48 MIL/uL — ABNORMAL LOW (ref 3.87–5.11)
RDW: 13.7 % (ref 11.5–15.5)
WBC Count: 3.1 K/uL — ABNORMAL LOW (ref 4.0–10.5)
nRBC: 0 % (ref 0.0–0.2)

## 2024-09-13 LAB — CMP (CANCER CENTER ONLY)
ALT: 15 U/L (ref 0–44)
AST: 25 U/L (ref 15–41)
Albumin: 4.5 g/dL (ref 3.5–5.0)
Alkaline Phosphatase: 109 U/L (ref 38–126)
Anion gap: 10 (ref 5–15)
BUN: 7 mg/dL (ref 6–20)
CO2: 26 mmol/L (ref 22–32)
Calcium: 9.5 mg/dL (ref 8.9–10.3)
Chloride: 106 mmol/L (ref 98–111)
Creatinine: 0.61 mg/dL (ref 0.44–1.00)
GFR, Estimated: >60 mL/min (ref >60)
Glucose, Bld: 87 mg/dL (ref 70–99)
Potassium: 3.7 mmol/L (ref 3.5–5.1)
Sodium: 141 mmol/L (ref 135–145)
Total Bilirubin: 0.5 mg/dL (ref 0.0–1.2)
Total Protein: 7.3 g/dL (ref 6.5–8.1)

## 2024-09-13 MED ORDER — ACETAMINOPHEN 325 MG PO TABS
650.0000 mg | ORAL_TABLET | Freq: Once | ORAL | Status: AC
  Administered 2024-09-13: 650 mg via ORAL
  Filled 2024-09-13: qty 2

## 2024-09-13 MED ORDER — PALONOSETRON HCL INJECTION 0.25 MG/5ML
0.2500 mg | Freq: Once | INTRAVENOUS | Status: AC
  Administered 2024-09-13: 0.25 mg via INTRAVENOUS
  Filled 2024-09-13: qty 5

## 2024-09-13 MED ORDER — FAM-TRASTUZUMAB DERUXTECAN-NXKI CHEMO 100 MG IV SOLR
4.4000 mg/kg | Freq: Once | INTRAVENOUS | Status: AC
  Administered 2024-09-13: 300 mg via INTRAVENOUS
  Filled 2024-09-13: qty 15

## 2024-09-13 MED ORDER — DEXAMETHASONE SOD PHOSPHATE PF 10 MG/ML IJ SOLN
10.0000 mg | Freq: Once | INTRAMUSCULAR | Status: AC
  Administered 2024-09-13: 10 mg via INTRAVENOUS

## 2024-09-13 MED ORDER — PEGFILGRASTIM INF DEV 6 MG/0.6ML ~~LOC~~ SOSY
6.0000 mg | PREFILLED_SYRINGE | Freq: Once | SUBCUTANEOUS | Status: AC
  Administered 2024-09-13: 6 mg via SUBCUTANEOUS

## 2024-09-13 MED ORDER — DIPHENHYDRAMINE HCL 25 MG PO CAPS
25.0000 mg | ORAL_CAPSULE | Freq: Once | ORAL | Status: AC
  Administered 2024-09-13: 25 mg via ORAL
  Filled 2024-09-13: qty 1

## 2024-09-13 MED ADMIN — Aprepitant IV Emulsion 130 MG/18ML: 130 mg | INTRAVENOUS | NDC 47426020101

## 2024-09-13 MED FILL — Aprepitant IV Emulsion 130 MG/18ML: 130.0000 mg | INTRAVENOUS | Qty: 18 | Status: AC

## 2024-09-13 NOTE — Patient Instructions (Signed)

## 2024-09-13 NOTE — Progress Notes (Unsigned)
 [facility] Cancer Follow up:    Suzanne Potts, MD [address] [place] [address]   DIAGNOSIS: Cancer Staging  Malignant neoplasm of lower-inner quadrant of right breast of female, estrogen receptor negative (HCC) Staging form: Breast, AJCC 8th Edition - Clinical: Stage IIIC (cT2, cN3c, cM0, G3, ER-, PR-, HER2-) - Unsigned Histologic grading system: 3 grade system    SUMMARY OF ONCOLOGIC HISTORY: Oncology History  Malignant neoplasm of lower-inner quadrant of right breast of female, estrogen receptor negative (HCC)  01/10/2023 Initial Diagnosis   Palpable lump right axilla x 2 weeks bulky axillary lymphadenopathy ultrasound breast: 2 masses at 4 o'clock position 1.1 cm and 0.6 cm multiple axillary lymph nodes largest 4.5 cm also level 2 and 3 (subpectoral lymph node as well): Biopsy: Grade 3 IDC ER 0%, PR 0%, Ki-67 95%, HER2 1+   01/26/2023 Genetic Testing   Negative [employer] Custom Panel (37 genes).  Report date is 01/26/2023.    The [employer] Custom Cancers + RNA Panel includes sequencing, deletion/duplication, and RNA analysis of the following 37 genes: APC, ATM, AXIN2, BARD1, BMPR1A, BRCA1, BRCA2, BRIP1, CDH1, CDK4*, CDKN2A (p14ARF)*, CDKN2A (p16INK4a), CHEK2, CTNNA1, DICER1, EPCAM*, FH, GREM1*, HOXB13*, MBD4*, MLH1, MSH2, MSH3, MSH6, MUTYH, NF1, NTHL1, PALB2, PMS2, POLD1, POLE, PTEN, RAD51C, RAD51D, SMAD4, SMARCA4, STK11, TP53.  *Genes without RNA analysis.    01/28/2023 - 06/27/2023 Chemotherapy   Patient is on Treatment Plan : BREAST Pembrolizumab  (200) D1 + Carboplatin  (1.5) D1,8,15 + Paclitaxel  (80) D1,8,15 q21d X 4 cycles / Pembrolizumab  (200) D1 + AC D1 q21d x 4 cycles     07/15/2023 - 12/29/2023 Chemotherapy   Patient is on Treatment Plan : BREAST Pembrolizumab  (200) q21d x 27 weeks     09/07/2023 - 10/26/2023 Radiation Therapy   First Treatment Date: 2023-09-07 Last Treatment Date: 2023-10-26   Plan Name: CW_R_BO Site: Chest Wall,  Right Technique: 3D Mode: Photon Dose Per Fraction: 1.8 Gy Prescribed Dose (Delivered / Prescribed): 25.2 Gy / 25.2 Gy Prescribed Fxs (Delivered / Prescribed): 14 / 14   Plan Name: CW_R_Neck_Ax Site: Chest Wall, Right Technique: 3D Mode: Photon Dose Per Fraction: 1.8 Gy Prescribed Dose (Delivered / Prescribed): 50.4 Gy / 50.4 Gy Prescribed Fxs (Delivered / Prescribed): 28 / 28   Plan Name: CW_R_Bst Site: Chest Wall, Right Technique: 3D Mode: Photon Dose Per Fraction: 1.8 Gy Prescribed Dose (Delivered / Prescribed): 9 Gy / 9 Gy Prescribed Fxs (Delivered / Prescribed): 5 / 5   Plan Name: CW_R Site: Chest Wall, Right Technique: 3D Mode: Photon Dose Per Fraction: 1.8 Gy Prescribed Dose (Delivered / Prescribed): 25.2 Gy / 25.2 Gy Prescribed Fxs (Delivered / Prescribed): 14 / 14     03/28/2024 -  Chemotherapy   Patient is on Treatment Plan : BREAST Fam-Trastuzumab Deruxtecan-nxki  (Enhertu ) (5.4) q21d       CURRENT THERAPY: Enhertu   INTERVAL HISTORY:  Discussed the use of AI scribe software for clinical note transcription with the patient, who gave verbal consent to proceed.  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  MEDICAL HISTORY: Past Medical History:  Diagnosis Date   Anginal pain 02/2019   related to MVP   Back pain    Breast cancer (HCC)    Depression    Dysrhythmia    MVP   Mitral prolapse    PONV (postoperative nausea and vomiting)    Sleep apnea    mild no cpap indicated    SURGICAL HISTORY: Past Surgical History:  Procedure Laterality Date   BREAST SURGERY Right 2024   Breast biopsy x 2   BRONCHIAL BIOPSY  03/19/2024   Procedure: BRONCHOSCOPY, WITH BIOPSY;  Surgeon: Shelah Lamar RAMAN, MD;  Location: [facility];  Service: Pulmonary;;   BRONCHIAL BRUSHINGS  03/19/2024   Procedure: BRONCHOSCOPY, WITH BRUSH BIOPSY;  Surgeon: Shelah Lamar RAMAN, MD;  Location: [facility];  Service: Pulmonary;;   BRONCHIAL NEEDLE ASPIRATION BIOPSY  03/19/2024   Procedure: BRONCHOSCOPY, WITH NEEDLE ASPIRATION BIOPSY;  Surgeon: Shelah Lamar RAMAN, MD;  Location: [facility];  Service: Pulmonary;;   BRONCHOSCOPY, WITH BIOPSY USING ELECTROMAGNETIC NAVIGATION Bilateral 03/19/2024   Procedure: BRONCHOSCOPY, WITH BIOPSY USING ELECTROMAGNETIC NAVIGATION;  Surgeon: Shelah Lamar RAMAN, MD;  Location: [facility];  Service: Pulmonary;  Laterality: Bilateral;  WITH FLURO   CESAREAN SECTION  03/30/2008   MASTECTOMY WITH AXILLARY LYMPH NODE DISSECTION Bilateral 07/27/2023   Procedure: BILATERAL MASTECTOMY WITH RIGHT AXILLARY LYMPH NODE DISSECTION;  Surgeon: Aron Shoulders, MD;  Location: [facility];  Service: General;  Laterality: Bilateral;  PEC BLOCK   PORT A CATH REVISION Right 05/23/2024   Procedure: REVISION, PORT-A-CATH INSERTION;  Surgeon: Aron Shoulders, MD;  Location: [facility];  Service: General;  Laterality: Right;   PORT-A-CATH REMOVAL Left 01/31/2024   Procedure: REMOVAL PORT-A-CATH;  Surgeon: Aron Shoulders, MD;  Location: [facility];   Service: General;  Laterality: Left;   PORTACATH PLACEMENT N/A 01/27/2023   Procedure: INSERTION PORT-A-CATH;  Surgeon: Aron Shoulders, MD;  Location: [facility];  Service: General;  Laterality: N/A;  leaving accessed   PORTACATH PLACEMENT Left 03/27/2024   Procedure: INSERTION, TUNNELED CENTRAL VENOUS DEVICE, WITH PORT;  Surgeon: Aron Shoulders, MD;  Location: [facility];  Service: General;  Laterality: Left;   ROBOTIC ASSISTED TOTAL HYSTERECTOMY Bilateral 03/15/2019   Procedure: XI ROBOTIC ASSISTED TOTAL HYSTERECTOMY with Bilateral Salpingectomy;  Surgeon: Darcel Pool, MD;  Location: [facility] [place];  Service: Gynecology;  Laterality: Bilateral;   TUBAL LIGATION  2017    SOCIAL HISTORY: Social History   Socioeconomic History   Marital status: Married    Spouse name: Not on file   Number of children: Not on file   Years of education: Not on file   Highest education level: Master's degree (e.g., MA, MS, MEng, MEd, MSW, MBA)  Occupational History   Not on file  Tobacco Use   Smoking status: Never   Smokeless tobacco: Never  Vaping Use   Vaping status: Never Used  Substance and Sexual Activity   Alcohol use: Not Currently    Comment:     Drug use: No   Sexual activity: Yes    Birth control/protection: Surgical  Other Topics Concern   Not on file  Social History Narrative   Not on file   Social Drivers of Health   Tobacco Use: Low Risk (09/13/2024)   Patient History    Smoking Tobacco Use: Never    Smokeless Tobacco Use: Never    Passive Exposure: Not on file  Financial Resource Strain: Low Risk (12/27/2022)   Overall Financial Resource Strain (CARDIA)    Difficulty of Paying Living Expenses: Not very hard  Food Insecurity: No Food Insecurity (08/24/2023)   Hunger Vital Sign    Worried About Running Out of Food in the Last Year: Never true    Ran Out of Food in the Last Year: Never true  Transportation Needs: No Transportation  Needs (08/24/2023)   PRAPARE - Administrator, Civil Service (Medical): No    Lack of Transportation (Non-Medical): No  Physical Activity: Insufficiently Active (12/27/2022)   Exercise Vital Sign    Days of Exercise per Week: 4 days    Minutes of Exercise per Session: 20 min  Stress: No Stress Concern Present (12/27/2022)   [employer] of Occupational Health - Occupational Stress Questionnaire    Feeling of Stress : Only a little  Social Connections: Moderately Isolated (12/27/2022)   Social Connection and Isolation Panel    Frequency of Communication with Friends and Family: More than three times a week    Frequency of Social Gatherings with Friends and Family: Once a week    Attends Religious Services: Never    Database Administrator or Organizations: No    Attends Engineer, Structural: Not on file    Marital Status: Married  Catering Manager Violence: Not At Risk (08/24/2023)   Humiliation, Afraid, Rape, and Kick questionnaire    Fear of Current or Ex-Partner: No    Emotionally Abused: No    Physically Abused: No    Sexually Abused: No  Depression (PHQ2-9): Low Risk (08/02/2024)   Depression (PHQ2-9)    PHQ-2 Score: 0  Alcohol Screen: Low Risk (12/27/2022)   Alcohol Screen    Last Alcohol Screening Score (AUDIT): 1  Housing: Unknown (05/16/2024)   Received from [facility]   Epic    Unable to Pay for Housing in the Last Year: Not on file    Number of Times Moved in the Last Year: Not on file    At any time in the past 12 months, were you homeless or living in a shelter (including now)?: No  Utilities: Not At Risk (08/24/2023)   [facility] Utilities    Threatened with loss of utilities: No  Health Literacy: Not on file    FAMILY HISTORY: Family History  Problem Relation Age of Onset   Stroke Mother    Alzheimer's disease Father    Diabetes Maternal Grandmother    Heart disease Maternal Grandmother    Heart  attack Maternal Grandmother    Diabetes Maternal Grandfather    Heart disease Maternal Grandfather    Alzheimer's disease Paternal Grandmother    Dementia Paternal Grandmother    Alzheimer's disease Paternal Grandfather    Dementia Paternal Grandfather    Alcohol abuse Paternal Grandfather     Review of Systems - Oncology    PHYSICAL EXAMINATION    Vitals:   09/13/24 1108  BP: 129/81  Pulse: 76  Resp: 19  Temp: 98 F (36.7 C)  SpO2: 100%    Physical Exam  LABORATORY DATA:  CBC    Component Value Date/Time   WBC 3.1 (L) 09/13/2024 1004  WBC 10.0 08/11/2023 0353   RBC 3.48 (L) 09/13/2024 1004   HGB 11.1 (L) 09/13/2024 1004   HGB 11.6 04/08/2022 1037   HCT 32.6 (L) 09/13/2024 1004   HCT 35.8 04/08/2022 1037   PLT 171 09/13/2024 1004   PLT 244 04/08/2022 1037   MCV 93.7 09/13/2024 1004   MCV 89 04/08/2022 1037   MCH 31.9 09/13/2024 1004   MCHC 34.0 09/13/2024 1004   RDW 13.7 09/13/2024 1004   RDW 12.1 04/08/2022 1037   LYMPHSABS 0.6 (L) 09/13/2024 1004   LYMPHSABS 1.3 04/08/2022 1037   MONOABS 0.4 09/13/2024 1004   EOSABS 0.3 09/13/2024 1004   EOSABS 0.1 04/08/2022 1037   BASOSABS 0.0 09/13/2024 1004   BASOSABS 0.0 04/08/2022 1037    CMP     Component Value Date/Time   NA 141 09/13/2024 1004   NA 140 04/08/2022 1037   K 3.7 09/13/2024 1004   CL 106 09/13/2024 1004   CO2 26 09/13/2024 1004   GLUCOSE 87 09/13/2024 1004   BUN 7 09/13/2024 1004   BUN 9 04/08/2022 1037   CREATININE 0.61 09/13/2024 1004   CALCIUM  9.5 09/13/2024 1004   PROT 7.3 09/13/2024 1004   PROT 6.5 04/08/2022 1037   ALBUMIN 4.5 09/13/2024 1004   ALBUMIN 4.3 04/08/2022 1037   AST 25 09/13/2024 1004   ALT 15 09/13/2024 1004   ALKPHOS 109 09/13/2024 1004   BILITOT 0.5 09/13/2024 1004   GFRNONAA >60 09/13/2024 1004   GFRAA 132 11/04/2020 1208     ASSESSMENT and THERAPY PLAN:   No problem-specific Assessment & Plan notes found for this encounter.   Assessment and  Plan Assessment & Plan       All questions were answered. The patient knows to call the clinic with any problems, questions or concerns. We can certainly see the patient much sooner if necessary.  Total encounter time:*** minutes*in face-to-face visit time, chart review, lab review, care coordination, order entry, and documentation of the encounter time.    Morna Kendall, NP 09/13/2024 11:20 AM Medical Oncology and Hematology [facility] [address] [place], [place] [address] Tel. [phone]    Fax. [phone]  *Total Encounter Time as defined by the Centers for Medicare and Medicaid Services includes, in addition to the face-to-face time of a patient visit (documented in the note above) non-face-to-face time: obtaining and reviewing outside history, ordering and reviewing medications, tests or procedures, care coordination (communications with other health care professionals or caregivers) and documentation in the medical record.

## 2024-09-17 ENCOUNTER — Encounter: Payer: Self-pay | Admitting: Hematology and Oncology

## 2024-09-17 ENCOUNTER — Encounter: Payer: Self-pay | Admitting: Adult Health

## 2024-09-17 NOTE — Assessment & Plan Note (Signed)
 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%, HER2 1+  01/25/2023: PET/CT: Right breast cancer, right axilla, retropectoral and right cervical lymph nodes   Treatment plan: Neoadjuvant chemotherapy with Taxol  carbo Keytruda  weekly x 12 followed by Adriamycin  Cytoxan  Keytruda  followed by Keytruda  maintenance until 12/30/2023 07/27/2023: Bilateral mastectomies: Left mastectomy: Benign, right mastectomy: No evidence of residual cancer, 8/20 lymph nodes positive with extranodal extension in 2 lymph nodes Adjuvant radiation therapy 09/08/2023-10/24/2022 Capecitabine  (completed June 2025) with Keytruda  (completed 12/29/2023) --------------------------------------------------------------------------------------- 03/09/2024: PET/CT: 4 mm node right axilla SUV 3.7, right subpectoral activity SUV 3.4 (possible 5 mm node) right apical 7 mm nodule is new compared to 12/23/2023 with an SUV of 2.3, right upper lobe 1 cm nodule new with a SUV of 3.6, left upper lobe nodule 7 mm SUV 4.2, soft tissue fullness proximal duodenum SUV 8.7 (possible duodenitis)   EGD: Bx: Duodenitis Lung FNA: malignant cells consistent with breast primary (triple negative) [facility] molecular testing: T p53, TMB 10.3 Brain MRI: Neg   Treatment plan: Enhertu    CT CAP 05/16/2024: Multiple pulmonary nodules in both lung apices are diminished in size or completely resolved, consistent with treatment response. Guardant360: P53 gene mutation

## 2024-09-19 ENCOUNTER — Other Ambulatory Visit: Payer: Self-pay

## 2024-09-24 ENCOUNTER — Ambulatory Visit

## 2024-09-24 ENCOUNTER — Ambulatory Visit: Admitting: Pulmonary Disease

## 2024-10-05 ENCOUNTER — Inpatient Hospital Stay: Attending: Hematology and Oncology

## 2024-10-05 ENCOUNTER — Encounter: Payer: Self-pay | Admitting: General Practice

## 2024-10-05 ENCOUNTER — Inpatient Hospital Stay

## 2024-10-05 ENCOUNTER — Inpatient Hospital Stay: Admitting: Adult Health

## 2024-10-05 VITALS — BP 118/70 | HR 74 | Temp 97.9°F | Resp 18 | Wt 147.8 lb

## 2024-10-05 DIAGNOSIS — C50311 Malignant neoplasm of lower-inner quadrant of right female breast: Secondary | ICD-10-CM | POA: Insufficient documentation

## 2024-10-05 DIAGNOSIS — Z9013 Acquired absence of bilateral breasts and nipples: Secondary | ICD-10-CM | POA: Insufficient documentation

## 2024-10-05 DIAGNOSIS — Z5112 Encounter for antineoplastic immunotherapy: Secondary | ICD-10-CM | POA: Insufficient documentation

## 2024-10-05 DIAGNOSIS — T451X5A Adverse effect of antineoplastic and immunosuppressive drugs, initial encounter: Secondary | ICD-10-CM | POA: Insufficient documentation

## 2024-10-05 DIAGNOSIS — Z171 Estrogen receptor negative status [ER-]: Secondary | ICD-10-CM | POA: Diagnosis not present

## 2024-10-05 DIAGNOSIS — D6481 Anemia due to antineoplastic chemotherapy: Secondary | ICD-10-CM | POA: Diagnosis not present

## 2024-10-05 DIAGNOSIS — Z79899 Other long term (current) drug therapy: Secondary | ICD-10-CM | POA: Insufficient documentation

## 2024-10-05 LAB — CBC WITH DIFFERENTIAL (CANCER CENTER ONLY)
Abs Immature Granulocytes: 0.01 K/uL (ref 0.00–0.07)
Basophils Absolute: 0 K/uL (ref 0.0–0.1)
Basophils Relative: 0 %
Eosinophils Absolute: 0.4 K/uL (ref 0.0–0.5)
Eosinophils Relative: 12 %
HCT: 33.4 % — ABNORMAL LOW (ref 36.0–46.0)
Hemoglobin: 11.7 g/dL — ABNORMAL LOW (ref 12.0–15.0)
Immature Granulocytes: 0 %
Lymphocytes Relative: 22 %
Lymphs Abs: 0.7 K/uL (ref 0.7–4.0)
MCH: 32.7 pg (ref 26.0–34.0)
MCHC: 35 g/dL (ref 30.0–36.0)
MCV: 93.3 fL (ref 80.0–100.0)
Monocytes Absolute: 0.3 K/uL (ref 0.1–1.0)
Monocytes Relative: 10 %
Neutro Abs: 1.8 K/uL (ref 1.7–7.7)
Neutrophils Relative %: 56 %
Platelet Count: 168 K/uL (ref 150–400)
RBC: 3.58 MIL/uL — ABNORMAL LOW (ref 3.87–5.11)
RDW: 13.8 % (ref 11.5–15.5)
WBC Count: 3.2 K/uL — ABNORMAL LOW (ref 4.0–10.5)
nRBC: 0 % (ref 0.0–0.2)

## 2024-10-05 LAB — CMP (CANCER CENTER ONLY)
ALT: 17 U/L (ref 0–44)
AST: 31 U/L (ref 15–41)
Albumin: 4.5 g/dL (ref 3.5–5.0)
Alkaline Phosphatase: 113 U/L (ref 38–126)
Anion gap: 11 (ref 5–15)
BUN: 9 mg/dL (ref 6–20)
CO2: 25 mmol/L (ref 22–32)
Calcium: 9.6 mg/dL (ref 8.9–10.3)
Chloride: 103 mmol/L (ref 98–111)
Creatinine: 0.71 mg/dL (ref 0.44–1.00)
GFR, Estimated: >60 mL/min
Glucose, Bld: 92 mg/dL (ref 70–99)
Potassium: 3.8 mmol/L (ref 3.5–5.1)
Sodium: 139 mmol/L (ref 135–145)
Total Bilirubin: 0.5 mg/dL (ref 0.0–1.2)
Total Protein: 7.4 g/dL (ref 6.5–8.1)

## 2024-10-05 MED ORDER — DEXTROSE 5 % IV SOLN: INTRAVENOUS | Status: DC

## 2024-10-05 MED ORDER — PEGFILGRASTIM INF DEV 6 MG/0.6ML ~~LOC~~ SOSY
6.0000 mg | PREFILLED_SYRINGE | Freq: Once | SUBCUTANEOUS | Status: AC
  Administered 2024-10-05: 6 mg via SUBCUTANEOUS

## 2024-10-05 MED ORDER — FAM-TRASTUZUMAB DERUXTECAN-NXKI CHEMO 100 MG IV SOLR
4.4000 mg/kg | Freq: Once | INTRAVENOUS | Status: AC
  Administered 2024-10-05: 300 mg via INTRAVENOUS
  Filled 2024-10-05: qty 15

## 2024-10-05 MED ORDER — DIPHENHYDRAMINE HCL 25 MG PO CAPS
25.0000 mg | ORAL_CAPSULE | Freq: Once | ORAL | Status: AC
  Administered 2024-10-05: 25 mg via ORAL
  Filled 2024-10-05: qty 1

## 2024-10-05 MED ORDER — PALONOSETRON HCL INJECTION 0.25 MG/5ML
0.2500 mg | Freq: Once | INTRAVENOUS | Status: AC
  Administered 2024-10-05: 0.25 mg via INTRAVENOUS
  Filled 2024-10-05: qty 5

## 2024-10-05 MED ORDER — APREPITANT 130 MG/18ML IV EMUL
130.0000 mg | Freq: Once | INTRAVENOUS | Status: AC
  Administered 2024-10-05: 130 mg via INTRAVENOUS
  Filled 2024-10-05: qty 18

## 2024-10-05 MED ORDER — ACETAMINOPHEN 325 MG PO TABS
650.0000 mg | ORAL_TABLET | Freq: Once | ORAL | Status: AC
  Administered 2024-10-05: 650 mg via ORAL
  Filled 2024-10-05: qty 2

## 2024-10-05 MED ORDER — DEXAMETHASONE SOD PHOSPHATE PF 10 MG/ML IJ SOLN
10.0000 mg | Freq: Once | INTRAMUSCULAR | Status: AC
  Administered 2024-10-05: 10 mg via INTRAVENOUS

## 2024-10-05 NOTE — Progress Notes (Signed)
 [facility] Cancer Follow up:    [name], MD [address] [place] [address]   DIAGNOSIS: Cancer Staging  Malignant neoplasm of lower-inner quadrant of right breast of female, estrogen receptor negative (HCC) Staging form: Breast, AJCC 8th Edition - Clinical: Stage IIIC (cT2, cN3c, cM0, G3, ER-, PR-, HER2-) - Unsigned Histologic grading system: 3 grade system   SUMMARY OF ONCOLOGIC HISTORY: Oncology History  Malignant neoplasm of lower-inner quadrant of right breast of female, estrogen receptor negative (HCC)  01/10/2023 Initial Diagnosis   Palpable lump right axilla x 2 weeks bulky axillary lymphadenopathy ultrasound breast: 2 masses at 4 o'clock position 1.1 cm and 0.6 cm multiple axillary lymph nodes largest 4.5 cm also level 2 and 3 (subpectoral lymph node as well): Biopsy: Grade 3 IDC ER 0%, PR 0%, Ki-67 95%, HER2 1+   01/26/2023 Genetic Testing   Negative [employer] Custom Panel (37 genes).  Report date is 01/26/2023.    The [employer] Custom Cancers + RNA Panel includes sequencing, deletion/duplication, and RNA analysis of the following 37 genes: APC, ATM, AXIN2, BARD1, BMPR1A, BRCA1, BRCA2, BRIP1, CDH1, CDK4*, CDKN2A (p14ARF)*, CDKN2A (p16INK4a), CHEK2, CTNNA1, DICER1, EPCAM*, FH, GREM1*, HOXB13*, MBD4*, MLH1, MSH2, MSH3, MSH6, MUTYH, NF1, NTHL1, PALB2, PMS2, POLD1, POLE, PTEN, RAD51C, RAD51D, SMAD4, SMARCA4, STK11, TP53.  *Genes without RNA analysis.    01/28/2023 - 06/27/2023 Chemotherapy   Patient is on Treatment Plan : BREAST Pembrolizumab  (200) D1 + Carboplatin  (1.5) D1,8,15 + Paclitaxel  (80) D1,8,15 q21d X 4 cycles / Pembrolizumab  (200) D1 + AC D1 q21d x 4 cycles     07/15/2023 - 12/29/2023 Chemotherapy   Patient is on Treatment Plan : BREAST Pembrolizumab  (200) q21d x 27 weeks     09/07/2023 - 10/26/2023 Radiation Therapy   First Treatment Date: 2023-09-07 Last Treatment Date: 2023-10-26   Plan Name: CW_R_BO Site: Chest Wall,  Right Technique: 3D Mode: Photon Dose Per Fraction: 1.8 Gy Prescribed Dose (Delivered / Prescribed): 25.2 Gy / 25.2 Gy Prescribed Fxs (Delivered / Prescribed): 14 / 14   Plan Name: CW_R_Neck_Ax Site: Chest Wall, Right Technique: 3D Mode: Photon Dose Per Fraction: 1.8 Gy Prescribed Dose (Delivered / Prescribed): 50.4 Gy / 50.4 Gy Prescribed Fxs (Delivered / Prescribed): 28 / 28   Plan Name: CW_R_Bst Site: Chest Wall, Right Technique: 3D Mode: Photon Dose Per Fraction: 1.8 Gy Prescribed Dose (Delivered / Prescribed): 9 Gy / 9 Gy Prescribed Fxs (Delivered / Prescribed): 5 / 5   Plan Name: CW_R Site: Chest Wall, Right Technique: 3D Mode: Photon Dose Per Fraction: 1.8 Gy Prescribed Dose (Delivered / Prescribed): 25.2 Gy / 25.2 Gy Prescribed Fxs (Delivered / Prescribed): 14 / 14     03/28/2024 -  Chemotherapy   Patient is on Treatment Plan : BREAST Fam-Trastuzumab Deruxtecan-nxki  (Enhertu ) (5.4) q21d       CURRENT THERAPY:enhertu   INTERVAL HISTORY:  Discussed the use of AI scribe software for clinical note transcription with the patient, who gave verbal consent to proceed.  History of Present Illness Suzanne Tucker is a 44 year old female with metastatic HER2-positive breast cancer who presents for follow-up prior to initiation of HER2-targeted therapy.  She is followed for metastatic HER2-positive breast cancer and is preparing to start HER2-targeted therapy.  She describes residual effects from prior therapy with cessation of hair shedding but abnormal hair texture and cyclical eyelash regrowth. She continues to wear wigs and has difficulty with sizing due to small head circumference.  During her most recent treatment cycle she  had increased fatigue, myalgias, sinus congestion, and persistent epistaxis without fever, which she attributes to a mild viral illness. Her prior cycle was better. She currently has no respiratory symptoms, reports stable breathing  including during prior pulmonary nodules, and has normal bowel function.  She is scheduled for evaluation of suspected sleep apnea. She follows a potassium-rich diet. She denies persistent or worsening musculoskeletal pain, with only transient soreness after moving furniture.     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  MEDICAL HISTORY: Past Medical History:  Diagnosis Date   Anginal pain 02/2019   related to MVP   Back pain    Breast cancer (HCC)    Depression    Dysrhythmia    MVP   Mitral prolapse    PONV (postoperative nausea and vomiting)    Sleep apnea    mild no cpap indicated    SURGICAL HISTORY: Past Surgical History:  Procedure Laterality Date   BREAST SURGERY Right 2024   Breast biopsy x 2   BRONCHIAL BIOPSY  03/19/2024   Procedure: BRONCHOSCOPY, WITH BIOPSY;  Surgeon: Shelah Lamar RAMAN, MD;  Location: [facility];  Service: Pulmonary;;   BRONCHIAL BRUSHINGS  03/19/2024   Procedure: BRONCHOSCOPY, WITH BRUSH BIOPSY;  Surgeon: Shelah Lamar RAMAN, MD;  Location: [facility];  Service: Pulmonary;;   BRONCHIAL NEEDLE ASPIRATION BIOPSY   03/19/2024   Procedure: BRONCHOSCOPY, WITH NEEDLE ASPIRATION BIOPSY;  Surgeon: Shelah Lamar RAMAN, MD;  Location: [facility];  Service: Pulmonary;;   BRONCHOSCOPY, WITH BIOPSY USING ELECTROMAGNETIC NAVIGATION Bilateral 03/19/2024   Procedure: BRONCHOSCOPY, WITH BIOPSY USING ELECTROMAGNETIC NAVIGATION;  Surgeon: Shelah Lamar RAMAN, MD;  Location: [facility];  Service: Pulmonary;  Laterality: Bilateral;  WITH FLURO   CESAREAN SECTION  03/30/2008   MASTECTOMY WITH AXILLARY LYMPH NODE DISSECTION Bilateral 07/27/2023   Procedure: BILATERAL MASTECTOMY WITH RIGHT AXILLARY LYMPH NODE DISSECTION;  Surgeon: Aron Shoulders, MD;  Location: [facility];  Service: General;  Laterality: Bilateral;  PEC BLOCK   PORT A CATH REVISION Right 05/23/2024   Procedure: REVISION, PORT-A-CATH INSERTION;  Surgeon: Aron Shoulders, MD;  Location: [facility];  Service: General;  Laterality: Right;   PORT-A-CATH REMOVAL Left 01/31/2024   Procedure: REMOVAL PORT-A-CATH;  Surgeon: Aron Shoulders, MD;  Location: [facility];  Service: General;  Laterality: Left;   PORTACATH PLACEMENT N/A 01/27/2023   Procedure: INSERTION PORT-A-CATH;  Surgeon: Aron Shoulders, MD;  Location: [facility];  Service: General;  Laterality: N/A;  leaving accessed   PORTACATH PLACEMENT Left 03/27/2024   Procedure: INSERTION, TUNNELED CENTRAL VENOUS DEVICE, WITH PORT;  Surgeon: Aron Shoulders, MD;  Location: [facility];  Service: General;  Laterality: Left;   ROBOTIC ASSISTED TOTAL HYSTERECTOMY Bilateral 03/15/2019   Procedure: XI ROBOTIC ASSISTED TOTAL HYSTERECTOMY with Bilateral  Salpingectomy;  Surgeon: Darcel Pool, MD;  Location: [facility];  Service: Gynecology;  Laterality: Bilateral;   TUBAL LIGATION  2017    SOCIAL HISTORY: Social History   Socioeconomic History   Marital status: Married    Spouse name: Not on file   Number of children: Not on file   Years of education: Not on file   Highest education  level: Master's degree (e.g., MA, MS, MEng, MEd, MSW, MBA)  Occupational History   Not on file  Tobacco Use   Smoking status: Never   Smokeless tobacco: Never  Vaping Use   Vaping status: Never Used  Substance and Sexual Activity   Alcohol use: Not Currently    Comment:     Drug use: No   Sexual activity: Yes    Birth control/protection: Surgical  Other Topics Concern   Not on file  Social History Narrative   Not on file   Social Drivers of Health   Tobacco Use: Low Risk (09/17/2024)   Patient History    Smoking Tobacco Use: Never    Smokeless Tobacco Use: Never    Passive Exposure: Not on file  Financial Resource Strain: Low Risk (12/27/2022)   Overall Financial Resource Strain (CARDIA)    Difficulty of Paying Living Expenses: Not very hard  Food Insecurity: No Food Insecurity (08/24/2023)   Hunger Vital Sign    Worried About Running Out of Food in the Last Year: Never true    Ran Out of Food in the Last Year: Never true  Transportation Needs: No Transportation Needs (08/24/2023)   PRAPARE - Administrator, Civil Service (Medical): No    Lack of Transportation (Non-Medical): No  Physical Activity: Insufficiently Active (12/27/2022)   Exercise Vital Sign    Days of Exercise per Week: 4 days    Minutes of Exercise per Session: 20 min  Stress: No Stress Concern Present (12/27/2022)   [employer] of Occupational Health - Occupational Stress Questionnaire    Feeling of Stress : Only a little  Social Connections: Moderately Isolated (12/27/2022)   Social Connection and Isolation Panel    Frequency of Communication with Friends and Family: More than three times a week    Frequency of Social Gatherings with Friends and Family: Once a week    Attends Religious Services: Never    Database Administrator or Organizations: No    Attends Engineer, Structural: Not on file    Marital Status: Married  Catering Manager Violence: Not At Risk (08/24/2023)    Humiliation, Afraid, Rape, and Kick questionnaire    Fear of Current or Ex-Partner: No    Emotionally Abused: No    Physically Abused: No    Sexually Abused: No  Depression (PHQ2-9): Low Risk (09/13/2024)   Depression (PHQ2-9)    PHQ-2 Score: 0  Alcohol Screen: Low Risk (12/27/2022)   Alcohol Screen    Last Alcohol Screening Score (AUDIT): 1  Housing: Unknown (05/16/2024)   Received from [facility]   Epic    Unable to Pay for Housing in the Last Year: Not on file    Number of Times Moved in the Last Year: Not on file    At any time in the past 12 months, were you homeless or living in a shelter (including now)?: No  Utilities: Not At Risk (08/24/2023)   [facility] Utilities    Threatened with loss of utilities: No  Health Literacy: Not on file  FAMILY HISTORY: Family History  Problem Relation Age of Onset   Stroke Mother    Alzheimer's disease Father    Diabetes Maternal Grandmother    Heart disease Maternal Grandmother    Heart attack Maternal Grandmother    Diabetes Maternal Grandfather    Heart disease Maternal Grandfather    Alzheimer's disease Paternal Grandmother    Dementia Paternal Grandmother    Alzheimer's disease Paternal Grandfather    Dementia Paternal Grandfather    Alcohol abuse Paternal Grandfather     Review of Systems  Constitutional:  Negative for appetite change, chills, fatigue, fever and unexpected weight change.  HENT:   Negative for hearing loss, lump/mass and trouble swallowing.   Eyes:  Negative for eye problems and icterus.  Respiratory:  Negative for chest tightness, cough and shortness of breath.   Cardiovascular:  Negative for chest pain, leg swelling and palpitations.  Gastrointestinal:  Negative for abdominal distention, abdominal pain, constipation, diarrhea, nausea and vomiting.  Endocrine: Negative for hot flashes.  Genitourinary:  Negative for difficulty urinating.   Musculoskeletal:  Negative for arthralgias.  Skin:   Negative for itching and rash.  Neurological:  Negative for dizziness, extremity weakness, headaches and numbness.  Hematological:  Negative for adenopathy. Does not bruise/bleed easily.  Psychiatric/Behavioral:  Negative for depression. The patient is not nervous/anxious.       PHYSICAL EXAMINATION    Vitals:   10/05/24 1012  BP: 118/70  Pulse: 74  Resp: 18  Temp: 97.9 F (36.6 C)  SpO2: 100%    Physical Exam Constitutional:      General: She is not in acute distress.    Appearance: Normal appearance. She is not toxic-appearing.  HENT:     Head: Normocephalic and atraumatic.     Mouth/Throat:     Mouth: Mucous membranes are moist.     Pharynx: Oropharynx is clear. No oropharyngeal exudate or posterior oropharyngeal erythema.  Eyes:     General: No scleral icterus. Cardiovascular:     Rate and Rhythm: Normal rate and regular rhythm.     Pulses: Normal pulses.     Heart sounds: Normal heart sounds.  Pulmonary:     Effort: Pulmonary effort is normal.     Breath sounds: Normal breath sounds.  Abdominal:     General: Abdomen is flat. Bowel sounds are normal. There is no distension.     Palpations: Abdomen is soft.     Tenderness: There is no abdominal tenderness.  Musculoskeletal:        General: No swelling.     Cervical back: Neck supple.  Lymphadenopathy:     Cervical: No cervical adenopathy.  Skin:    General: Skin is warm and dry.     Findings: No rash.  Neurological:     General: No focal deficit present.     Mental Status: She is alert.  Psychiatric:        Mood and Affect: Mood normal.        Behavior: Behavior normal.     LABORATORY DATA:  CBC    Component Value Date/Time   WBC 3.2 (L) 10/05/2024 0927   WBC 10.0 08/11/2023 0353   RBC 3.58 (L) 10/05/2024 0927   HGB 11.7 (L) 10/05/2024 0927   HGB 11.6 04/08/2022 1037   HCT 33.4 (L) 10/05/2024 0927   HCT 35.8 04/08/2022 1037   PLT 168 10/05/2024 0927   PLT 244 04/08/2022 1037   MCV 93.3  10/05/2024 0927   MCV 89 04/08/2022  1037   MCH 32.7 10/05/2024 0927   MCHC 35.0 10/05/2024 0927   RDW 13.8 10/05/2024 0927   RDW 12.1 04/08/2022 1037   LYMPHSABS 0.7 10/05/2024 0927   LYMPHSABS 1.3 04/08/2022 1037   MONOABS 0.3 10/05/2024 0927   EOSABS 0.4 10/05/2024 0927   EOSABS 0.1 04/08/2022 1037   BASOSABS 0.0 10/05/2024 0927   BASOSABS 0.0 04/08/2022 1037    CMP     Component Value Date/Time   NA 139 10/05/2024 0927   NA 140 04/08/2022 1037   K 3.8 10/05/2024 0927   CL 103 10/05/2024 0927   CO2 25 10/05/2024 0927   GLUCOSE 92 10/05/2024 0927   BUN 9 10/05/2024 0927   BUN 9 04/08/2022 1037   CREATININE 0.71 10/05/2024 0927   CALCIUM  9.6 10/05/2024 0927   PROT 7.4 10/05/2024 0927   PROT 6.5 04/08/2022 1037   ALBUMIN 4.5 10/05/2024 0927   ALBUMIN 4.3 04/08/2022 1037   AST 31 10/05/2024 0927   ALT 17 10/05/2024 0927   ALKPHOS 113 10/05/2024 0927   BILITOT 0.5 10/05/2024 0927   GFRNONAA >60 10/05/2024 0927   GFRAA 132 11/04/2020 1208     ASSESSMENT and THERAPY PLAN:   No problem-specific Assessment & Plan notes found for this encounter.   Assessment and Plan Assessment & Plan Metastatic breast cancer Undergoing active management with stable disease on surveillance imaging. Symptoms likely due to mild viral illness, not disease progression. Cardiac function stable. - Proceed with treatment today.  Labs reviewed with patient in detail.   - Confirmed Guardian Reveal blood test today; no interference with other tests. - Planned next surveillance imaging (CT chest/abdomen/pelvis) post-appointment with Dr. Valley, end of January or early February. - Encouraged avoidance of heavy lifting and reporting of persistent or worsening pain. - Reviewed labs and cardiac function; no acute concerns.   All questions were answered. The patient knows to call the clinic with any problems, questions or concerns. We can certainly see the patient much sooner if necessary.  Total  encounter time:20 minutes*in face-to-face visit time, chart review, lab review, care coordination, order entry, and documentation of the encounter time.    Morna Kendall, NP 10/05/2024 10:43 AM Medical Oncology and Hematology [facility] [address] [place], [place] [address] Tel. [phone]    Fax. [phone]  *Total Encounter Time as defined by the Centers for Medicare and Medicaid Services includes, in addition to the face-to-face time of a patient visit (documented in the note above) non-face-to-face time: obtaining and reviewing outside history, ordering and reviewing medications, tests or procedures, care coordination (communications with other health care professionals or caregivers) and documentation in the medical record.

## 2024-10-05 NOTE — Progress Notes (Signed)
 [facility] Spiritual Care Note  Followed up in infusion for social and emotional support. Suzanne Tucker reports that she is doing well overall right now, which is a source of comfort and relief. She is aware of ongoing Spiritual Care availability as needed/desired.  [address] [name], [place], [facility] Pager [phone] Voicemail [phone]

## 2024-10-05 NOTE — Patient Instructions (Signed)

## 2024-10-08 ENCOUNTER — Encounter: Payer: Self-pay | Admitting: Adult Health

## 2024-10-08 ENCOUNTER — Encounter: Payer: Self-pay | Admitting: Hematology and Oncology

## 2024-10-11 ENCOUNTER — Encounter: Payer: Self-pay | Admitting: Pharmacist

## 2024-10-11 ENCOUNTER — Other Ambulatory Visit: Payer: Self-pay | Admitting: Pharmacist

## 2024-10-12 ENCOUNTER — Encounter: Payer: Self-pay | Admitting: *Deleted

## 2024-10-15 ENCOUNTER — Encounter: Payer: Self-pay | Admitting: Hematology and Oncology

## 2024-10-18 ENCOUNTER — Encounter: Payer: Self-pay | Admitting: Hematology and Oncology

## 2024-10-18 ENCOUNTER — Other Ambulatory Visit: Payer: Self-pay

## 2024-10-18 ENCOUNTER — Other Ambulatory Visit: Payer: Self-pay | Admitting: *Deleted

## 2024-10-18 ENCOUNTER — Ambulatory Visit: Admitting: Pulmonary Disease

## 2024-10-18 MED ORDER — AMOXICILLIN 500 MG PO CAPS: 2000.0000 mg | ORAL_CAPSULE | Freq: Once | ORAL | 0 refills | Status: AC

## 2024-10-18 NOTE — Progress Notes (Signed)
 Received call from pt stating she is scheduled for an emergncy dental procedure this afternoon.  Pt states she has a crown that has fallen off and unsure of the dental work that will be done today.  RN reviewed with MD and verbal orders received and placed for pt to be prescribed Amoxicillin  2 g p.o to take prior to dental procedure today.  Pt educated and verbalized understanding.

## 2024-10-22 ENCOUNTER — Ambulatory Visit: Admitting: Primary Care

## 2024-10-22 ENCOUNTER — Encounter: Payer: Self-pay | Admitting: Primary Care

## 2024-10-22 VITALS — BP 118/72 | HR 69 | Temp 97.5°F | Ht 61.0 in | Wt 151.6 lb

## 2024-10-22 DIAGNOSIS — R0683 Snoring: Secondary | ICD-10-CM | POA: Diagnosis not present

## 2024-10-22 DIAGNOSIS — R918 Other nonspecific abnormal finding of lung field: Secondary | ICD-10-CM

## 2024-10-22 DIAGNOSIS — C50919 Malignant neoplasm of unspecified site of unspecified female breast: Secondary | ICD-10-CM

## 2024-10-22 NOTE — Patient Instructions (Addendum)
 " VISIT SUMMARY: During your visit, we discussed your sleep issues, including loud snoring and irregular breathing patterns, and your ongoing treatment for pulmonary nodules. We reviewed your history and planned further evaluations and follow-ups.  YOUR PLAN: -EVALUATION FOR SLEEP-DISORDERED BREATHING: Sleep-disordered breathing includes conditions like sleep apnea, where breathing repeatedly stops and starts during sleep. We discussed the risks of untreated sleep apnea and the potential need for CPAP therapy. A home sleep study has been ordered through [employer]. Please send a [employer] message after submitting the sleep study for results. Additionally, we discussed sleep hygiene tips such as avoiding alcohol before bed, not using sedatives unless prescribed, and considering sleeping on your side.  -PULMONARY NODULES, POST-CHEMOTHERAPY: Pulmonary nodules are small growths in the lungs. Your recent CT scan showed that most of your nodules have diminished in size, indicating a positive response to treatment. However, a new cluster of nodules was noted, likely due to inflammation from chemotherapy or prior viral respiratory infection. We will continue to monitor these nodules with a follow-up CT scan scheduled for the end of January or February. Please send a message after the CT scan to discuss the results and any necessary follow-up.  INSTRUCTIONS: Please complete the home sleep study and send a [employer] message after submitting the study for results. Additionally, follow up with a CT scan at the end of January or February and send a message to discuss the results.  Follow-up after HST and CT results- can be virtual visit     Sleep Apnea  Sleep apnea is a condition that affects your breathing while you are sleeping. Your tongue or soft tissue in your throat may block the flow of air while you sleep. You may have shallow breathing or stop breathing for short periods of time. People with sleep  apnea may snore loudly. There are three kinds of sleep apnea: Obstructive sleep apnea. This kind is caused by a blocked or collapsed airway. This is the most common. Central sleep apnea. This kind happens when the part of the brain that controls breathing does not send the correct signals to the muscles that control breathing. Mixed sleep apnea. This is a combination of obstructive and central sleep apnea. What are the causes? The most common cause of sleep apnea is a collapsed or blocked airway. What increases the risk? Being very overweight. Having family members with sleep apnea. Having a tongue or tonsils that are larger than normal. Having a small airway or jaw problems. Being older. What are the signs or symptoms? Loud snoring. Restless sleep. Trouble staying asleep. Being sleepy or tired during the day. Waking up gasping or choking. Having a headache in the morning. Mood swings. Having a hard time remembering things and concentrating. How is this diagnosed? A medical history. A physical exam. A sleep study. This is also called a polysomnography test. This test is done at a sleep lab or in your home while you are sleeping. How is this treated? Treatment may include: Sleeping on your side. Losing weight if you're overweight. Wearing an oral appliance. This is a mouthpiece that moves your lower jaw forward. Using a positive airway pressure (PAP) device to keep your airways open while you sleep, such as: A continuous positive airway pressure (CPAP) device. This device gives forced air through a mask when you breathe out. This keeps your airways open. A bilevel positive airway pressure (BIPAP) device. This device gives forced air through a mask when you breathe in and when you breathe out  to keep your airways open. Having surgery if other treatments do not work. If your sleep apnea is not treated, you may be at risk for: Heart failure. Heart attack. Stroke. Type 2 diabetes or  a problem with your blood sugar called insulin resistance. Follow these instructions at home: Medicines Take your medicines only as told by your health care provider. Avoid alcohol, medicines to help you relax, and certain pain medicines. These may make sleep apnea worse. General instructions Do not smoke, vape, or use products with nicotine or tobacco in them. If you need help quitting, talk with your provider. If you were given a PAP device to open your airway while you sleep, use it as told by your provider. If you're having surgery, make sure to tell your provider you have sleep apnea. You may need to bring your PAP device with you. Contact a health care provider if: The PAP device that you were given to use during sleep bothers you or does not seem to be working. You do not feel better or you feel worse. Get help right away if: You have trouble breathing. You have chest pain. You have trouble talking. One side of your body feels weak. A part of your face is hanging down. These symptoms may be an emergency. Call 911 right away. Do not wait to see if the symptoms will go away. Do not drive yourself to the hospital. This information is not intended to replace advice given to you by your health care provider. Make sure you discuss any questions you have with your health care provider. Document Revised: 06/23/2023 Document Reviewed: 11/25/2022 Elsevier Patient Education  2024 [employer].     "

## 2024-10-22 NOTE — Progress Notes (Signed)
 "  @Patient  ID: Suzanne Tucker, female    DOB: 1981-08-31, 44 y.o.   MRN: [id number]  No chief complaint on file.   Referring provider: [name], MD  HPI: 44 year old female, never smoked. PMH significant for metastatic breast cancer, dysautonomia, mitral valve prolapse.   10/22/2024 Discussed the use of AI scribe software for clinical note transcription with the patient, who gave verbal consent to proceed.  History of Present Illness Suzanne Tucker is a 44 year old female with pulmonary nodules who presents with sleep issues and snoring.  She experiences significant sleep issues characterized by loud, disruptive snoring and irregular breathing patterns during sleep. The snoring is described as 'pretty bad' and occurs with every exhale or inhale, sometimes waking her up. There is a family history of sleep apnea, with her aunt and possibly her mother affected. She underwent two previous home sleep studies approximately 15 years ago, which showed some irregular breathing but were not conclusive enough for insurance to cover a CPAP machine.   She is currently under treatment for metastatic breast cancer and is following up with oncology. A PET scan from June 2025 showed metastatic disease with mildly hypermetabolic nodules in the right upper lobe and one left lower lobe nodule. She had a bilateral mastectomy in October 2024, and previous pulmonary nodules have significantly diminished or resolved, consistent with treatment response.  A CT chest scan in November showed no evidence of new or progressive metastatic disease, with previous small pulmonary nodules further improved. However, a new ill-defined cluster of nodules in the medial left lower lobe was noted. She is undergoing chemotherapy and reports no increased shortness of breath or cough.   She has a history of coughing up green sputum following an infusion on October 30th, with a CT scan performed four to five  days later. She has not experienced any recent respiratory symptoms.  Next CT end of January/February 2026 with Dr. Gudina.     Allergies[1]  Immunization History  Administered Date(s) Administered   PFIZER(Purple Top)SARS-COV-2 Vaccination 11/26/2019   PPD Test 08/16/2016   Tdap 01/03/2008, 06/18/2019    Past Medical History:  Diagnosis Date   Anginal pain 02/2019   related to MVP   Back pain    Breast cancer (HCC)    Depression    Dysrhythmia    MVP   Mitral prolapse    PONV (postoperative nausea and vomiting)    Sleep apnea    mild no cpap indicated    Tobacco History: Tobacco Use History[2] Counseling given: Not Answered   Outpatient Medications Prior to Visit  Medication Sig Dispense Refill   calcium  elemental as carbonate (BARIATRIC TUMS ULTRA) 400 MG chewable tablet Chew 2,000 mg by mouth daily as needed for indigestion or heartburn.     Dextrose -Fructose-Sod Citrate (NAUZENE) [phone] MG CHEW Chew 1 tablet by mouth daily as needed (Nausea).     fexofenadine  (ALLEGRA ) 180 MG tablet Take 180 mg by mouth daily.     fluticasone  (FLONASE ) 50 MCG/ACT nasal spray Place 2 sprays into both nostrils daily as needed for allergies.     lidocaine -prilocaine  (EMLA ) cream Apply to affected area once 30 g 3   Melatonin 10 MG TABS Take 2.5 mg by mouth at bedtime.     Multiple Vitamins-Minerals (WOMENS MULTI VITAMIN & MINERAL PO) Take 1 tablet by mouth daily.     pantoprazole  (PROTONIX ) 40 MG tablet Take 1 tablet (40 mg total) by mouth 2 (two) times daily. Protonix   40mg  PO BID for 8 weeks then reduce to 40 mg daily 180 tablet 3   No facility-administered medications prior to visit.   Review of Systems  Review of Systems  Constitutional: Negative.   Respiratory: Negative.  Negative for cough and shortness of breath.   Psychiatric/Behavioral:  Positive for sleep disturbance.    Physical Exam  There were no vitals taken for this visit. Physical Exam Constitutional:       Appearance: Normal appearance. She is well-developed.  HENT:     Head: Normocephalic and atraumatic.     Mouth/Throat:     Mouth: Mucous membranes are moist.     Pharynx: Oropharynx is clear.  Eyes:     Pupils: Pupils are equal, round, and reactive to light.  Cardiovascular:     Rate and Rhythm: Normal rate and regular rhythm.     Heart sounds: Normal heart sounds. No murmur heard.    Comments: RRR Pulmonary:     Effort: Pulmonary effort is normal. No respiratory distress.     Breath sounds: Normal breath sounds. No wheezing or rhonchi.     Comments: CTA Musculoskeletal:        General: Normal range of motion.     Cervical back: Normal range of motion and neck supple.  Skin:    General: Skin is warm and dry.     Findings: No erythema or rash.  Neurological:     General: No focal deficit present.     Mental Status: She is alert and oriented to person, place, and time. Mental status is at baseline.  Psychiatric:        Mood and Affect: Mood normal.        Behavior: Behavior normal.        Thought Content: Thought content normal.        Judgment: Judgment normal.     Lab Results:  CBC    Component Value Date/Time   WBC 3.2 (L) 10/05/2024 0927   WBC 10.0 08/11/2023 0353   RBC 3.58 (L) 10/05/2024 0927   HGB 11.7 (L) 10/05/2024 0927   HGB 11.6 04/08/2022 1037   HCT 33.4 (L) 10/05/2024 0927   HCT 35.8 04/08/2022 1037   PLT 168 10/05/2024 0927   PLT 244 04/08/2022 1037   MCV 93.3 10/05/2024 0927   MCV 89 04/08/2022 1037   MCH 32.7 10/05/2024 0927   MCHC 35.0 10/05/2024 0927   RDW 13.8 10/05/2024 0927   RDW 12.1 04/08/2022 1037   LYMPHSABS 0.7 10/05/2024 0927   LYMPHSABS 1.3 04/08/2022 1037   MONOABS 0.3 10/05/2024 0927   EOSABS 0.4 10/05/2024 0927   EOSABS 0.1 04/08/2022 1037   BASOSABS 0.0 10/05/2024 0927   BASOSABS 0.0 04/08/2022 1037    BMET    Component Value Date/Time   NA 139 10/05/2024 0927   NA 140 04/08/2022 1037   K 3.8 10/05/2024 0927   CL  103 10/05/2024 0927   CO2 25 10/05/2024 0927   GLUCOSE 92 10/05/2024 0927   BUN 9 10/05/2024 0927   BUN 9 04/08/2022 1037   CREATININE 0.71 10/05/2024 0927   CALCIUM  9.6 10/05/2024 0927   GFRNONAA >60 10/05/2024 0927   GFRAA 132 11/04/2020 1208    BNP No results found for: BNP  ProBNP No results found for: PROBNP  Imaging: No results found.   Assessment & Plan:   1. Loud snoring (Primary) - Home sleep test; Future  2. Pulmonary nodules   Assessment and Plan Assessment & Plan  Evaluation for sleep-disordered breathing Reports loud, disruptive snoring with irregular breathing patterns, suggestive of sleep-disordered breathing. Previous home sleep studies were inconclusive. Family history of sleep apnea. Discussed potential risks of untreated sleep apnea, including cardiac arrhythmias, stroke, diabetes, pulmonary hypertension, and Alzheimer's disease. Explained that CPAP is the gold standard treatment if sleep apnea is confirmed, with oral appliances as an alternative for mild cases, though insurance coverage may be limited. - Ordered home sleep study through [facility]. - Advised to send a Mychart message after submitting the sleep study for results. - Discussed potential CPAP therapy if sleep apnea is confirmed. - Advised on sleep hygiene: avoid alcohol before bed, avoid sedatives not prescribed by a doctor, and consider side sleeping position. Ok to continue melatonin.   Pulmonary nodules, post-chemotherapy Undergoing chemotherapy for metastatic breast cancer. Multiple pulmonary nodules have diminished in size and nearly resolved, consistent with treatment response. Recent CT scan in November showed no evidence of new or progressive metastatic disease. New ill-defined cluster of nodules in the medial left lower lobe, likely inflammation, possibly related to chemotherapy or recent viral respiratory illness. No current respiratory symptoms reported. Follow-up CT scan  scheduled for end of January or February. - Continue monitoring pulmonary nodules with follow-up CT scan in January or February. - Send a message after CT scan to discuss results and any necessary follow-up.   Suzanne LELON Ferrari, NP 10/22/2024     [1]  Allergies Allergen Reactions   Antihistamines, Loratadine-Type Other (See Comments)    Patient reports numbness of the tongue   Kiwi Extract Swelling and Other (See Comments)    Mouth tingles, too   Morphine Anaphylaxis and Shortness Of Breath   Oxycontin  [Oxycodone ] Other (See Comments)    RPh has recommended this not be taken  (Oxycontin )   Tape Other (See Comments)    NEEDS DRESSINGS FOR SENSITIVE SKIN!!!   Tessalon [Benzonatate] Hives, Itching and Other (See Comments)    Hives, itching, burning all over.    Tramadol  Swelling and Other (See Comments)    Tingling in the mouth with swelling    Codeine Nausea And Vomiting   Hydrocodone Hives   Latex Rash  [2]  Social History Tobacco Use  Smoking Status Never  Smokeless Tobacco Never   "

## 2024-10-24 ENCOUNTER — Encounter: Payer: Self-pay | Admitting: *Deleted

## 2024-10-24 NOTE — Progress Notes (Signed)
 Per MD okay to treat with Echo from 07/16/2024.

## 2024-10-25 ENCOUNTER — Inpatient Hospital Stay

## 2024-10-25 ENCOUNTER — Inpatient Hospital Stay: Admitting: Hematology and Oncology

## 2024-10-25 VITALS — BP 122/80 | HR 87 | Temp 98.4°F | Resp 18 | Ht 61.0 in | Wt 150.4 lb

## 2024-10-25 DIAGNOSIS — Z171 Estrogen receptor negative status [ER-]: Secondary | ICD-10-CM | POA: Diagnosis not present

## 2024-10-25 DIAGNOSIS — C50311 Malignant neoplasm of lower-inner quadrant of right female breast: Secondary | ICD-10-CM

## 2024-10-25 LAB — CBC WITH DIFFERENTIAL (CANCER CENTER ONLY)
Abs Immature Granulocytes: 0 K/uL (ref 0.00–0.07)
Basophils Absolute: 0 K/uL (ref 0.0–0.1)
Basophils Relative: 1 %
Eosinophils Absolute: 0.2 K/uL (ref 0.0–0.5)
Eosinophils Relative: 7 %
HCT: 30.5 % — ABNORMAL LOW (ref 36.0–46.0)
Hemoglobin: 10.5 g/dL — ABNORMAL LOW (ref 12.0–15.0)
Immature Granulocytes: 0 %
Lymphocytes Relative: 26 %
Lymphs Abs: 0.7 K/uL (ref 0.7–4.0)
MCH: 32 pg (ref 26.0–34.0)
MCHC: 34.4 g/dL (ref 30.0–36.0)
MCV: 93 fL (ref 80.0–100.0)
Monocytes Absolute: 0.4 K/uL (ref 0.1–1.0)
Monocytes Relative: 14 %
Neutro Abs: 1.3 K/uL — ABNORMAL LOW (ref 1.7–7.7)
Neutrophils Relative %: 52 %
Platelet Count: 135 K/uL — ABNORMAL LOW (ref 150–400)
RBC: 3.28 MIL/uL — ABNORMAL LOW (ref 3.87–5.11)
RDW: 13.8 % (ref 11.5–15.5)
WBC Count: 2.5 K/uL — ABNORMAL LOW (ref 4.0–10.5)
nRBC: 0 % (ref 0.0–0.2)

## 2024-10-25 LAB — CMP (CANCER CENTER ONLY)
ALT: 19 U/L (ref 0–44)
AST: 31 U/L (ref 15–41)
Albumin: 4.3 g/dL (ref 3.5–5.0)
Alkaline Phosphatase: 114 U/L (ref 38–126)
Anion gap: 9 (ref 5–15)
BUN: 11 mg/dL (ref 6–20)
CO2: 26 mmol/L (ref 22–32)
Calcium: 9.2 mg/dL (ref 8.9–10.3)
Chloride: 106 mmol/L (ref 98–111)
Creatinine: 0.59 mg/dL (ref 0.44–1.00)
GFR, Estimated: >60 mL/min
Glucose, Bld: 81 mg/dL (ref 70–99)
Potassium: 4 mmol/L (ref 3.5–5.1)
Sodium: 140 mmol/L (ref 135–145)
Total Bilirubin: 0.5 mg/dL (ref 0.0–1.2)
Total Protein: 7 g/dL (ref 6.5–8.1)

## 2024-10-25 MED ORDER — PEGFILGRASTIM (INF DEV) 6 MG/0.6ML ~~LOC~~ SOSY
6.0000 mg | PREFILLED_SYRINGE | Freq: Once | SUBCUTANEOUS | Status: AC
  Administered 2024-10-25: 6 mg via SUBCUTANEOUS
  Filled 2024-10-25: qty 0.6

## 2024-10-25 MED ORDER — ACETAMINOPHEN 325 MG PO TABS
650.0000 mg | ORAL_TABLET | Freq: Once | ORAL | Status: AC
  Administered 2024-10-25: 650 mg via ORAL
  Filled 2024-10-25: qty 2

## 2024-10-25 MED ORDER — APREPITANT 130 MG/18ML IV EMUL
130.0000 mg | Freq: Once | INTRAVENOUS | Status: AC
  Administered 2024-10-25: 130 mg via INTRAVENOUS
  Filled 2024-10-25: qty 18

## 2024-10-25 MED ORDER — FAM-TRASTUZUMAB DERUXTECAN-NXKI CHEMO 100 MG IV SOLR
4.4000 mg/kg | Freq: Once | INTRAVENOUS | Status: AC
  Administered 2024-10-25: 300 mg via INTRAVENOUS
  Filled 2024-10-25: qty 15

## 2024-10-25 MED ORDER — DIPHENHYDRAMINE HCL 25 MG PO CAPS
25.0000 mg | ORAL_CAPSULE | Freq: Once | ORAL | Status: AC
  Administered 2024-10-25: 25 mg via ORAL
  Filled 2024-10-25: qty 1

## 2024-10-25 MED ORDER — PALONOSETRON HCL INJECTION 0.25 MG/5ML
0.2500 mg | Freq: Once | INTRAVENOUS | Status: AC
  Administered 2024-10-25: 0.25 mg via INTRAVENOUS
  Filled 2024-10-25: qty 5

## 2024-10-25 MED ORDER — DEXAMETHASONE SOD PHOSPHATE PF 10 MG/ML IJ SOLN
10.0000 mg | Freq: Once | INTRAMUSCULAR | Status: AC
  Administered 2024-10-25: 10 mg via INTRAVENOUS
  Filled 2024-10-25: qty 1

## 2024-10-25 MED ORDER — DEXTROSE 5 % IV SOLN: INTRAVENOUS | Status: DC

## 2024-10-25 NOTE — Assessment & Plan Note (Signed)
 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%, HER2 1+  01/25/2023: PET/CT: Right breast cancer, right axilla, retropectoral and right cervical lymph nodes   Treatment plan: Neoadjuvant chemotherapy with Taxol  carbo Keytruda  weekly x 12 followed by Adriamycin  Cytoxan  Keytruda  followed by Keytruda  maintenance until 12/30/2023 07/27/2023: Bilateral mastectomies: Left mastectomy: Benign, right mastectomy: No evidence of residual cancer, 8/20 lymph nodes positive with extranodal extension in 2 lymph nodes Adjuvant radiation therapy 09/08/2023-10/24/2022 Capecitabine  (completed June 2025) with Keytruda  (completed 12/29/2023) --------------------------------------------------------------------------------------- 03/09/2024: PET/CT: 4 mm node right axilla SUV 3.7, right subpectoral activity SUV 3.4 (possible 5 mm node) right apical 7 mm nodule is new compared to 12/23/2023 with an SUV of 2.3, right upper lobe 1 cm nodule new with a SUV of 3.6, left upper lobe nodule 7 mm SUV 4.2, soft tissue fullness proximal duodenum SUV 8.7 (possible duodenitis)   EGD: Bx: Duodenitis Lung FNA: malignant cells consistent with breast primary (triple negative) [facility] molecular testing: T p53, TMB 10.3 Brain MRI: Neg   Treatment plan: Enhertu  started 03/28/24, today is cycle 11 Toxicities: Fatigue Throat redness: Thrush: Diflucan  was prescribed and it is better Neutropenia: I recommended adding Neulasta  injection on day 3 and reducing the dosage of her chemotherapy. Chemo induced anemia: Monitoring closely.   CT CAP 08/07/2024: Multiple pulmonary nodules in both lung apices are diminished in size or completely resolved, consistent with treatment response.  Ill-defined nodules medial left  lower lobe likely inflammatory  Guardant360: TP53 gene mutation   Return to clinic every 3 weeks for Enhertu 

## 2024-10-25 NOTE — Progress Notes (Signed)
 "  Patient Care Team: [name], MD as PCP - General (Hematology and Oncology) Dann Candyce RAMAN, MD as PCP - Cardiology (Cardiology) Fernande Elspeth BROCKS, MD (Inactive) as PCP - Electrophysiology (Cardiology) [name], MD as Consulting Physician (Hematology and Oncology) Izell Domino, MD as Attending Physician (Radiation Oncology) Aron Shoulders, MD as Consulting Physician (General Surgery) Tyree Nanetta SAILOR, RN as Oncology Nurse Navigator  DIAGNOSIS:  Encounter Diagnosis  Name Primary?   Malignant neoplasm of lower-inner quadrant of right breast of female, estrogen receptor negative (HCC) Yes    SUMMARY OF ONCOLOGIC HISTORY: Oncology History  Malignant neoplasm of lower-inner quadrant of right breast of female, estrogen receptor negative (HCC)  01/10/2023 Initial Diagnosis   Palpable lump right axilla x 2 weeks bulky axillary lymphadenopathy ultrasound breast: 2 masses at 4 o'clock position 1.1 cm and 0.6 cm multiple axillary lymph nodes largest 4.5 cm also level 2 and 3 (subpectoral lymph node as well): Biopsy: Grade 3 IDC ER 0%, PR 0%, Ki-67 95%, HER2 1+   01/26/2023 Genetic Testing   Negative [employer] Custom Panel (37 genes).  Report date is 01/26/2023.    The [employer] Custom Cancers + RNA Panel includes sequencing, deletion/duplication, and RNA analysis of the following 37 genes: APC, ATM, AXIN2, BARD1, BMPR1A, BRCA1, BRCA2, BRIP1, CDH1, CDK4*, CDKN2A (p14ARF)*, CDKN2A (p16INK4a), CHEK2, CTNNA1, DICER1, EPCAM*, FH, GREM1*, HOXB13*, MBD4*, MLH1, MSH2, MSH3, MSH6, MUTYH, NF1, NTHL1, PALB2, PMS2, POLD1, POLE, PTEN, RAD51C, RAD51D, SMAD4, SMARCA4, STK11, TP53.  *Genes without RNA analysis.    01/28/2023 - 06/27/2023 Chemotherapy   Patient is on Treatment Plan : BREAST Pembrolizumab  (200) D1 + Carboplatin  (1.5) D1,8,15 + Paclitaxel  (80) D1,8,15 q21d X 4 cycles / Pembrolizumab  (200) D1 + AC D1 q21d x 4 cycles     07/15/2023 - 12/29/2023 Chemotherapy   Patient is on Treatment Plan :  BREAST Pembrolizumab  (200) q21d x 27 weeks     09/07/2023 - 10/26/2023 Radiation Therapy   First Treatment Date: 2023-09-07 Last Treatment Date: 2023-10-26   Plan Name: CW_R_BO Site: Chest Wall, Right Technique: 3D Mode: Photon Dose Per Fraction: 1.8 Gy Prescribed Dose (Delivered / Prescribed): 25.2 Gy / 25.2 Gy Prescribed Fxs (Delivered / Prescribed): 14 / 14   Plan Name: CW_R_Neck_Ax Site: Chest Wall, Right Technique: 3D Mode: Photon Dose Per Fraction: 1.8 Gy Prescribed Dose (Delivered / Prescribed): 50.4 Gy / 50.4 Gy Prescribed Fxs (Delivered / Prescribed): 28 / 28   Plan Name: CW_R_Bst Site: Chest Wall, Right Technique: 3D Mode: Photon Dose Per Fraction: 1.8 Gy Prescribed Dose (Delivered / Prescribed): 9 Gy / 9 Gy Prescribed Fxs (Delivered / Prescribed): 5 / 5   Plan Name: CW_R Site: Chest Wall, Right Technique: 3D Mode: Photon Dose Per Fraction: 1.8 Gy Prescribed Dose (Delivered / Prescribed): 25.2 Gy / 25.2 Gy Prescribed Fxs (Delivered / Prescribed): 14 / 14     03/28/2024 -  Chemotherapy   Patient is on Treatment Plan : BREAST Fam-Trastuzumab Deruxtecan-nxki  (Enhertu ) (5.4) q21d       CHIEF COMPLIANT: Cycle 11 Enhertu   HISTORY OF PRESENT ILLNESS:  History of Present Illness Suzanne Tucker is a 44 year old female with 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  She is on cycle 6 of Enhertu  4.4 mg/kg. The most recent cycle was well tolerated with minimal adverse effects. The prior cycle caused increased fatigue and sinus symptoms without clear infection. She has mild arthralgias, vivid nightmares, and nocturnal hot sensations without documented fever.  She denies fever, chills, sweats, abnormal bleeding, bruising, or new/enlarging masses.  Recent labs show a white blood cell count of 2.5 with neutrophils remaining above 1.0. She is worried about  possible dose reduction or treatment delay if cytopenias worsen.  She reports poorer diet with higher sugar intake, less protein, and no exercise, with a five-pound weight gain and lower energy. She is vegetarian and usually eats lentils, tofu, spinach, and eggs for protein but has not been consistent. She is motivated to improve nutrition and resume physical activity.  A follow-up scan is planned for early February to reassess a known left lung lesion with pulmonology and oncology involved. She is completing a home sleep study for suspected sleep apnea. She has a non-painful dental issue requiring extraction, which she is postponing until she reviews blood counts after the next labs.      ALLERGIES:  is allergic to antihistamines, loratadine-type; kiwi extract; morphine; oxycontin  [oxycodone ]; tape; tessalon [benzonatate]; tramadol ; codeine; hydrocodone; and latex.  MEDICATIONS:  Current Outpatient Medications  Medication Sig Dispense Refill   calcium  elemental as carbonate (BARIATRIC TUMS ULTRA) 400 MG chewable tablet Chew 2,000 mg by mouth daily as needed for indigestion or heartburn.     Dextrose -Fructose-Sod Citrate (NAUZENE) [phone] MG CHEW Chew 1 tablet by mouth daily as needed (Nausea).     fexofenadine  (ALLEGRA ) 180 MG tablet Take 180 mg by mouth daily.     fluticasone  (FLONASE ) 50 MCG/ACT nasal spray Place 2 sprays into both nostrils daily as needed for allergies.     lidocaine -prilocaine  (EMLA ) cream Apply to affected area once 30 g 3   Melatonin 10 MG TABS Take 2.5 mg by mouth at bedtime.     Multiple Vitamins-Minerals (WOMENS MULTI VITAMIN & MINERAL PO) Take 1 tablet by mouth daily.     pantoprazole  (PROTONIX ) 40 MG tablet Take 1 tablet (40 mg total) by mouth 2 (two) times daily. Protonix  40mg  PO BID for 8 weeks then reduce to 40 mg daily 180 tablet 3   No current facility-administered medications for this visit.    PHYSICAL EXAMINATION: ECOG PERFORMANCE STATUS: 1 -  Symptomatic but completely ambulatory  Vitals:   10/25/24 0955  BP: 122/80  Pulse: 87  Resp: 18  Temp: 98.4 F (36.9 C)  SpO2: 99%   Filed Weights   10/25/24 0955  Weight: 150 lb 6.4 oz (68.2 kg)   LABORATORY DATA:  I have reviewed the data as listed    Latest Ref Rng & Units 10/25/2024    9:30 AM 10/05/2024    9:27 AM 09/13/2024   10:04 AM  CMP  Glucose 70 - 99 mg/dL 81  92  87   BUN 6 - 20 mg/dL 11  9  7    Creatinine 0.44 - 1.00 mg/dL 9.40  9.28  9.38   Sodium 135 - 145 mmol/L 140  139  141   Potassium 3.5 - 5.1 mmol/L 4.0  3.8  3.7   Chloride 98 - 111 mmol/L 106  103  106   CO2 22 - 32 mmol/L 26  25  26    Calcium  8.9 - 10.3 mg/dL 9.2  9.6  9.5   Total Protein 6.5 - 8.1 g/dL 7.0  7.4  7.3   Total Bilirubin 0.0 - 1.2 mg/dL 0.5  0.5  0.5   Alkaline Phos 38 - 126 U/L 114  113  109   AST 15 - 41 U/L 31  31  25    ALT 0 - 44 U/L 19  17  15     Lab Results  Component Value Date   WBC 2.5 (L) 10/25/2024   HGB 10.5 (L) 10/25/2024   HCT 30.5 (L) 10/25/2024   MCV 93.0 10/25/2024   PLT 135 (L) 10/25/2024   NEUTROABS 1.3 (L) 10/25/2024    ASSESSMENT & PLAN:  Malignant neoplasm of lower-inner quadrant of right breast of female, estrogen receptor negative (HCC) 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%, HER2 1+  01/25/2023: PET/CT: Right breast cancer, right axilla, retropectoral and right cervical lymph nodes   Treatment plan: Neoadjuvant chemotherapy with Taxol  carbo Keytruda  weekly x 12 followed by Adriamycin  Cytoxan  Keytruda  followed by Keytruda  maintenance until 12/30/2023 07/27/2023: Bilateral mastectomies: Left mastectomy: Benign, right mastectomy: No evidence of residual cancer, 8/20 lymph nodes positive with extranodal extension  in 2 lymph nodes Adjuvant radiation therapy 09/08/2023-10/24/2022 Capecitabine  (completed June 2025) with Keytruda  (completed 12/29/2023) --------------------------------------------------------------------------------------- 03/09/2024: PET/CT: 4 mm node right axilla SUV 3.7, right subpectoral activity SUV 3.4 (possible 5 mm node) right apical 7 mm nodule is new compared to 12/23/2023 with an SUV of 2.3, right upper lobe 1 cm nodule new with a SUV of 3.6, left upper lobe nodule 7 mm SUV 4.2, soft tissue fullness proximal duodenum SUV 8.7 (possible duodenitis)   EGD: Bx: Duodenitis Lung FNA: malignant cells consistent with breast primary (triple negative) [facility] molecular testing: T p53, TMB 10.3 Brain MRI: Neg   Treatment plan: Enhertu  started 03/28/24, today is cycle 11 Toxicities: Fatigue Throat redness: Thrush: Diflucan  was prescribed and it is better Neutropenia: We discussed reducing the dose of chemotherapy once again but decided to hold off on it at this time.  She understands that if with the next treatment her ANC is below 1 then she would not be able to receive treatment.  She tells me that she will increase her protein intake and will exercise and see if that makes a difference. Chemo induced anemia: Monitoring closely.   CT CAP 08/07/2024: Multiple pulmonary nodules in both lung apices are diminished in size or completely resolved, consistent with treatment response.  Ill-defined nodules medial left lower lobe likely inflammatory  Guardant360: TP53 gene mutation Proceed with today's ANC of 1.3 Return to clinic every 3 weeks for Enhertu    No orders of the defined types were placed in this encounter.  The patient has a good understanding of the overall plan. she agrees with it. she will call with any problems that may develop before the next visit here.  I personally spent a total of 45 minutes in the care of the patient today including preparing to see the patient, getting/reviewing  separately obtained history, performing a medically appropriate exam/evaluation, counseling and educating, placing orders, referring and communicating with other health care professionals, documenting clinical information in the EHR, independently interpreting results, communicating results, and coordinating care.   Viinay K Abreanna Drawdy, MD 10/25/24    "

## 2024-10-25 NOTE — Patient Instructions (Signed)

## 2024-10-29 ENCOUNTER — Ambulatory Visit

## 2024-11-08 ENCOUNTER — Ambulatory Visit (HOSPITAL_COMMUNITY)
Admission: RE | Admit: 2024-11-08 | Discharge: 2024-11-08 | Disposition: A | Source: Ambulatory Visit | Attending: Hematology and Oncology

## 2024-11-08 DIAGNOSIS — C50311 Malignant neoplasm of lower-inner quadrant of right female breast: Secondary | ICD-10-CM

## 2024-11-08 MED ORDER — IOHEXOL 300 MG/ML  SOLN
100.0000 mL | Freq: Once | INTRAMUSCULAR | Status: AC | PRN
  Administered 2024-11-08: 100 mL via INTRAVENOUS

## 2024-11-15 ENCOUNTER — Inpatient Hospital Stay

## 2024-11-15 ENCOUNTER — Inpatient Hospital Stay: Admitting: Adult Health

## 2024-11-15 ENCOUNTER — Inpatient Hospital Stay: Attending: Hematology and Oncology
